# Patient Record
Sex: Male | Born: 1955 | Race: White | Hispanic: No | Marital: Married | Smoking: Former smoker
Health system: Southern US, Community
[De-identification: ages and names within clinical notes are randomized; demographics above are authoritative.]

## PROBLEM LIST (undated history)

## (undated) DIAGNOSIS — G529 Cranial nerve disorder, unspecified: Secondary | ICD-10-CM

## (undated) DIAGNOSIS — I714 Abdominal aortic aneurysm, without rupture, unspecified: Secondary | ICD-10-CM

## (undated) DIAGNOSIS — G250 Essential tremor: Secondary | ICD-10-CM

## (undated) DIAGNOSIS — F32A Depression, unspecified: Secondary | ICD-10-CM

## (undated) DIAGNOSIS — M5430 Sciatica, unspecified side: Secondary | ICD-10-CM

## (undated) DIAGNOSIS — K219 Gastro-esophageal reflux disease without esophagitis: Secondary | ICD-10-CM

## (undated) DIAGNOSIS — R296 Repeated falls: Secondary | ICD-10-CM

## (undated) DIAGNOSIS — I639 Cerebral infarction, unspecified: Secondary | ICD-10-CM

## (undated) DIAGNOSIS — R0600 Dyspnea, unspecified: Secondary | ICD-10-CM

## (undated) DIAGNOSIS — G629 Polyneuropathy, unspecified: Secondary | ICD-10-CM

## (undated) DIAGNOSIS — G2581 Restless legs syndrome: Secondary | ICD-10-CM

## (undated) DIAGNOSIS — F329 Major depressive disorder, single episode, unspecified: Secondary | ICD-10-CM

## (undated) DIAGNOSIS — I2510 Atherosclerotic heart disease of native coronary artery without angina pectoris: Secondary | ICD-10-CM

## (undated) DIAGNOSIS — K589 Irritable bowel syndrome without diarrhea: Secondary | ICD-10-CM

## (undated) DIAGNOSIS — I219 Acute myocardial infarction, unspecified: Secondary | ICD-10-CM

## (undated) DIAGNOSIS — M533 Sacrococcygeal disorders, not elsewhere classified: Secondary | ICD-10-CM

## (undated) DIAGNOSIS — J449 Chronic obstructive pulmonary disease, unspecified: Secondary | ICD-10-CM

## (undated) DIAGNOSIS — G894 Chronic pain syndrome: Secondary | ICD-10-CM

## (undated) DIAGNOSIS — F1020 Alcohol dependence, uncomplicated: Secondary | ICD-10-CM

## (undated) DIAGNOSIS — J45909 Unspecified asthma, uncomplicated: Secondary | ICD-10-CM

## (undated) DIAGNOSIS — I10 Essential (primary) hypertension: Secondary | ICD-10-CM

## (undated) DIAGNOSIS — J189 Pneumonia, unspecified organism: Secondary | ICD-10-CM

## (undated) DIAGNOSIS — M5104 Intervertebral disc disorders with myelopathy, thoracic region: Secondary | ICD-10-CM

## (undated) DIAGNOSIS — R079 Chest pain, unspecified: Secondary | ICD-10-CM

## (undated) DIAGNOSIS — F419 Anxiety disorder, unspecified: Secondary | ICD-10-CM

## (undated) DIAGNOSIS — M48061 Spinal stenosis, lumbar region without neurogenic claudication: Secondary | ICD-10-CM

## (undated) DIAGNOSIS — E785 Hyperlipidemia, unspecified: Secondary | ICD-10-CM

## (undated) DIAGNOSIS — M1990 Unspecified osteoarthritis, unspecified site: Secondary | ICD-10-CM

## (undated) DIAGNOSIS — N419 Inflammatory disease of prostate, unspecified: Secondary | ICD-10-CM

## (undated) HISTORY — PX: JOINT REPLACEMENT: SHX530

## (undated) HISTORY — DX: Major depressive disorder, single episode, unspecified: F32.9

## (undated) HISTORY — PX: KNEE SURGERY: SHX244

## (undated) HISTORY — DX: Chronic obstructive pulmonary disease, unspecified: J44.9

## (undated) HISTORY — DX: Chest pain, unspecified: R07.9

## (undated) HISTORY — DX: Sacrococcygeal disorders, not elsewhere classified: M53.3

## (undated) HISTORY — DX: Depression, unspecified: F32.A

## (undated) HISTORY — DX: Intervertebral disc disorders with myelopathy, thoracic region: M51.04

## (undated) HISTORY — DX: Essential (primary) hypertension: I10

---

## 2005-07-17 ENCOUNTER — Emergency Department: Payer: Self-pay | Admitting: Emergency Medicine

## 2005-09-17 ENCOUNTER — Encounter: Payer: Self-pay | Admitting: Family Medicine

## 2005-09-26 ENCOUNTER — Encounter: Payer: Self-pay | Admitting: Family Medicine

## 2005-10-27 ENCOUNTER — Encounter: Payer: Self-pay | Admitting: Family Medicine

## 2008-02-27 DIAGNOSIS — I219 Acute myocardial infarction, unspecified: Secondary | ICD-10-CM

## 2008-02-27 HISTORY — DX: Acute myocardial infarction, unspecified: I21.9

## 2008-06-28 DIAGNOSIS — F3341 Major depressive disorder, recurrent, in partial remission: Secondary | ICD-10-CM | POA: Insufficient documentation

## 2008-06-28 DIAGNOSIS — M5430 Sciatica, unspecified side: Secondary | ICD-10-CM | POA: Insufficient documentation

## 2008-06-28 DIAGNOSIS — I2510 Atherosclerotic heart disease of native coronary artery without angina pectoris: Secondary | ICD-10-CM | POA: Insufficient documentation

## 2009-01-13 DIAGNOSIS — M48061 Spinal stenosis, lumbar region without neurogenic claudication: Secondary | ICD-10-CM | POA: Insufficient documentation

## 2010-02-26 HISTORY — PX: CARDIAC CATHETERIZATION: SHX172

## 2010-04-17 ENCOUNTER — Ambulatory Visit: Payer: Self-pay | Admitting: Internal Medicine

## 2011-12-20 ENCOUNTER — Ambulatory Visit: Payer: Self-pay | Admitting: Family Medicine

## 2012-01-22 ENCOUNTER — Encounter: Payer: Self-pay | Admitting: Physical Medicine & Rehabilitation

## 2012-02-08 ENCOUNTER — Encounter: Payer: Worker's Compensation | Attending: Physical Medicine & Rehabilitation

## 2012-02-08 ENCOUNTER — Ambulatory Visit (HOSPITAL_BASED_OUTPATIENT_CLINIC_OR_DEPARTMENT_OTHER): Payer: Worker's Compensation | Admitting: Physical Medicine & Rehabilitation

## 2012-02-08 ENCOUNTER — Encounter: Payer: Self-pay | Admitting: Physical Medicine & Rehabilitation

## 2012-02-08 VITALS — BP 151/85 | HR 63 | Resp 14 | Ht 67.0 in | Wt 129.0 lb

## 2012-02-08 DIAGNOSIS — W1789XA Other fall from one level to another, initial encounter: Secondary | ICD-10-CM | POA: Insufficient documentation

## 2012-02-08 DIAGNOSIS — M533 Sacrococcygeal disorders, not elsewhere classified: Secondary | ICD-10-CM

## 2012-02-08 DIAGNOSIS — Z5181 Encounter for therapeutic drug level monitoring: Secondary | ICD-10-CM

## 2012-02-08 DIAGNOSIS — R269 Unspecified abnormalities of gait and mobility: Secondary | ICD-10-CM

## 2012-02-08 DIAGNOSIS — M549 Dorsalgia, unspecified: Secondary | ICD-10-CM

## 2012-02-08 DIAGNOSIS — R209 Unspecified disturbances of skin sensation: Secondary | ICD-10-CM | POA: Insufficient documentation

## 2012-02-08 HISTORY — DX: Sacrococcygeal disorders, not elsewhere classified: M53.3

## 2012-02-08 NOTE — Progress Notes (Signed)
Subjective:    Patient ID: Joseph Peters, male    DOB: [birth year], 56 y.o.   MRN: [id number] Chief complaint is intermittent numbness on the entire left side of body HPI Fall from 6 foot scaffold on October 18 Fell onto buttocks hit his head on foundation.  Patient does not recall loss of consciousness Pain shoots up in back of head. Has had feeling of numbness on the left side. Head CT performed 02/07/2012 was normal No speech problems Complains of memory issues Has episodic numbness on the side of the face left arm and left leg.  Lasting 5 or 10 minutes. Sometimes this is provoked by activity sometimes it occurs when watching TV. Had pain in the back of the head about 15 years ago but no numbness on the left side of the body  Some blurriness of vision at times  Buttocks pain is mild. Has not been back to work since injury Has been sedentary since his injury on October 18 Sleeping a lot  Pain Inventory Average Pain 8 Pain Right Now 8 My pain is stabbing  In the last 24 hours, has pain interfered with the following? General activity 10 Relation with others 10 Enjoyment of life 10 What TIME of day is your pain at its worst? night Sleep (in general) Poor  Pain is worse with: walking, bending, sitting and standing Pain improves with: rest and medication Relief from Meds: 2  Mobility walk without assistance ability to climb steps?  yes do you drive?  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  no   Family History  Problem Relation Age of Onset  . Cancer Mother   . COPD Father 10    black lung   History   Social History  . Marital Status: Unknown    Spouse Name: N/A    Number of  Children: N/A  . Years of Education: N/A   Social History Main Topics  . Smoking status: Current Every Day Smoker -- 1.0 packs/day for 30 years    Types: Cigarettes  . Smokeless tobacco: Never Used  . Alcohol Use: None  . Drug Use: None  . Sexually Active: None   Other Topics Concern  . None   Social History Narrative  . None   Past Surgical History  Procedure Date  . Knee surgery     right  . Cardiac catheterization Minor blockage   Past Medical History  Diagnosis Date  . Depression   . COPD (chronic obstructive pulmonary disease)    BP 151/85  Pulse 63  Resp 14  Ht 5\' 7"  (1.702 m)  Wt 129 lb (58.514 kg)  BMI 20.20 kg/m2  SpO2 99%    Review of Systems  Constitutional: Positive for unexpected weight change.  HENT: Positive for neck pain.   Musculoskeletal: Positive for gait problem.       Spasm  Neurological: Positive for dizziness and numbness.       Tingling  Hematological: Bruises/bleeds easily.  Psychiatric/Behavioral: Positive for dysphoric mood. The patient is nervous/anxious.   All other systems reviewed and are negative.       Objective:   Physical Exam  Constitutional: He is oriented to person, place, and time. He appears well-developed and well-nourished.  HENT:  Head: Normocephalic and atraumatic.  Eyes: Conjunctivae normal and EOM are normal. Pupils are equal, round, and reactive to light.       Visual fields are intact to confrontation testing  Musculoskeletal:       Cervical back: He exhibits normal range of motion, no tenderness, no bony tenderness, no swelling, no edema, no deformity, no pain and no spasm.       Lumbar back: He exhibits tenderness, bony tenderness and pain. He exhibits normal range of motion, no swelling, no edema, no deformity and no spasm.       Back:       Tenderness over right PSIS Tenderness over coccyx Negative straight leg raising test   Neurological: He is alert and oriented to person, place, and time. He has  normal strength and normal reflexes. He displays no atrophy. No cranial nerve deficit or sensory deficit. He exhibits normal muscle tone. Coordination and gait abnormal. He displays no Babinski's sign on the right side. He displays no Babinski's sign on the left side.  Reflex Scores:      Tricep reflexes are 2+ on the right side and 2+ on the left side.      Bicep reflexes are 2+ on the right side and 2+ on the left side.      Brachioradialis reflexes are 2+ on the right side and 2+ on the left side.      Patellar reflexes are 2+ on the right side and 2+ on the left side.      Achilles reflexes are 2+ on the right side and 2+ on the left side.      Positive Romberg Unable to do tandem gait  Psychiatric: His mood appears anxious. His affect is angry. He is agitated. He is not slowed.       Became angry when I suggested his Intermittent episodes of numbness is likely not trauma related      Assessment & Plan:  1 work related injury fall from scaffolding approximately 2 months ago.His chief complaint is intermittent numbness on the entire left side of the body. I reviewed the CT of the head which showed no evidence of subdural hematoma or hemorrhagic contusion. No CVA noted. Given the history of smoking Hyperlipidemia and his age, I would be suspicious of transient ischemic attacks. I would recommend pursuing workup for this outside of the workers comp system.  If this workup is negative and the patient continues to have some complaints of balance deficits as well as Headache pain, I would recommend he sees my partner Dr. Riley Kill to be evaluated for post concussive syndrome.  2. Coccydynia as well as possible sacroiliac pain status post fall. He may benefit from sacroiliac injection with Steroid only given his lidocaine allergy.This can be scheduled with me  I met with the patient his wife and his caseworker Antony Salmon to discuss this case and my findings and recommendations  I recommend no  work at this time until further workup is pursued

## 2012-02-08 NOTE — Patient Instructions (Signed)
I'm concerned about the episodic numbness on the left side. The CAT scan showed no evidence of brain trauma from your fall. Because you are a smoker you are at risk for strokes or mini strokes. This needs to be checked out by your doctor or by a neurologist.I would recommend no work until this is done.  If there is no evidence of strokes or mini strokes.  We can set to up for sacroiliac injection.I can do that injection.  If you continue to complain of memory issues or headaches or balance problems he would need to see my partner Dr. Riley Kill who specializes in post concussive disorders.  He can also write therapy orders and set work restrictions

## 2012-05-14 DIAGNOSIS — I6529 Occlusion and stenosis of unspecified carotid artery: Secondary | ICD-10-CM | POA: Insufficient documentation

## 2012-06-23 ENCOUNTER — Emergency Department: Payer: Self-pay | Admitting: Emergency Medicine

## 2012-06-23 LAB — CBC
HGB: 15.6 g/dL (ref 13.0–18.0)
MCH: 32.8 pg (ref 26.0–34.0)
Platelet: 174 10*3/uL (ref 150–440)
RBC: 4.76 10*6/uL (ref 4.40–5.90)
RDW: 13.1 % (ref 11.5–14.5)
WBC: 9.1 10*3/uL (ref 3.8–10.6)

## 2012-06-23 LAB — BASIC METABOLIC PANEL
Anion Gap: 8 (ref 7–16)
Chloride: 105 mmol/L (ref 98–107)
Co2: 26 mmol/L (ref 21–32)
EGFR (African American): >60
Glucose: 88 mg/dL (ref 65–99)
Sodium: 139 mmol/L (ref 136–145)

## 2013-02-03 DIAGNOSIS — M5104 Intervertebral disc disorders with myelopathy, thoracic region: Secondary | ICD-10-CM | POA: Insufficient documentation

## 2013-02-03 HISTORY — DX: Intervertebral disc disorders with myelopathy, thoracic region: M51.04

## 2013-02-26 DIAGNOSIS — I714 Abdominal aortic aneurysm, without rupture, unspecified: Secondary | ICD-10-CM

## 2013-02-26 DIAGNOSIS — G529 Cranial nerve disorder, unspecified: Secondary | ICD-10-CM

## 2013-02-26 DIAGNOSIS — I639 Cerebral infarction, unspecified: Secondary | ICD-10-CM | POA: Diagnosis present

## 2013-02-26 HISTORY — DX: Cranial nerve disorder, unspecified: G52.9

## 2013-02-26 HISTORY — DX: Abdominal aortic aneurysm, without rupture: I71.4

## 2013-02-26 HISTORY — DX: Abdominal aortic aneurysm, without rupture, unspecified: I71.40

## 2013-02-26 HISTORY — DX: Cerebral infarction, unspecified: I63.9

## 2013-11-30 DIAGNOSIS — N419 Inflammatory disease of prostate, unspecified: Secondary | ICD-10-CM | POA: Insufficient documentation

## 2014-01-20 ENCOUNTER — Emergency Department: Payer: Self-pay | Admitting: Emergency Medicine

## 2014-07-15 ENCOUNTER — Encounter: Payer: Self-pay | Admitting: Emergency Medicine

## 2014-07-15 ENCOUNTER — Emergency Department
Admission: EM | Admit: 2014-07-15 | Discharge: 2014-07-15 | Disposition: A | Discharge status: Home / Self Care | Payer: Medicaid Other | Attending: Emergency Medicine | Admitting: Emergency Medicine

## 2014-07-15 ENCOUNTER — Other Ambulatory Visit: Payer: Self-pay

## 2014-07-15 ENCOUNTER — Emergency Department: Payer: Medicaid Other

## 2014-07-15 DIAGNOSIS — Z880 Allergy status to penicillin: Secondary | ICD-10-CM | POA: Insufficient documentation

## 2014-07-15 DIAGNOSIS — Z720 Tobacco use: Secondary | ICD-10-CM | POA: Diagnosis not present

## 2014-07-15 DIAGNOSIS — R079 Chest pain, unspecified: Secondary | ICD-10-CM

## 2014-07-15 DIAGNOSIS — Z79899 Other long term (current) drug therapy: Secondary | ICD-10-CM | POA: Insufficient documentation

## 2014-07-15 DIAGNOSIS — I10 Essential (primary) hypertension: Secondary | ICD-10-CM | POA: Insufficient documentation

## 2014-07-15 LAB — BASIC METABOLIC PANEL
Anion gap: 9 (ref 5–15)
BUN: 11 mg/dL (ref 6–20)
CHLORIDE: 102 mmol/L (ref 101–111)
CO2: 27 mmol/L (ref 22–32)
CREATININE: 0.98 mg/dL (ref 0.61–1.24)
Calcium: 9.2 mg/dL (ref 8.9–10.3)
Glucose, Bld: 81 mg/dL (ref 65–99)
Potassium: 4.1 mmol/L (ref 3.5–5.1)
Sodium: 138 mmol/L (ref 135–145)

## 2014-07-15 LAB — CBC
HCT: 44.7 % (ref 40.0–52.0)
Hemoglobin: 15.3 g/dL (ref 13.0–18.0)
MCH: 33.3 pg (ref 26.0–34.0)
MCHC: 34.3 g/dL (ref 32.0–36.0)
MCV: 97.1 fL (ref 80.0–100.0)
PLATELETS: 171 10*3/uL (ref 150–440)
RBC: 4.61 MIL/uL (ref 4.40–5.90)
RDW: 13.2 % (ref 11.5–14.5)
WBC: 8.5 10*3/uL (ref 3.8–10.6)

## 2014-07-15 LAB — TROPONIN I

## 2014-07-15 MED ORDER — LORAZEPAM 0.5 MG PO TABS
ORAL_TABLET | ORAL | Status: AC
Start: 2014-07-15 — End: 2014-07-15
  Administered 2014-07-15: 0.5 mg via ORAL
  Filled 2014-07-15: qty 1

## 2014-07-15 MED ORDER — LORAZEPAM 0.5 MG PO TABS
0.5000 mg | ORAL_TABLET | Freq: Once | ORAL | Status: AC
  Administered 2014-07-15: 0.5 mg via ORAL

## 2014-07-15 NOTE — Discharge Instructions (Signed)

## 2014-07-15 NOTE — ED Notes (Signed)
Pt was at a Urology appt and they took his B/P it was elevated so they sent pt here. Pt states that he started getting chest pain yesterday. No radiation, but numbness in his left arm. He reports that he feels SOB and nauseated.

## 2014-07-15 NOTE — ED Provider Notes (Addendum)
[facility] Emergency Department Provider Note  ____________________________________________  Time seen: Approximately 610 PM  I have reviewed the triage vital signs and the nursing notes.   HISTORY  Chief Complaint Hypertension    HPI Joseph Peters is a 59 y.o. male with a history of COPD, depression and chronic pain who was sent over from his urologist's office today for high blood pressure. The patient said he is also been having chest pain for the past 2 days. Yesterday he had chest pain for several hours which she described as feeling like something was stuck under his left chest. He said the pain was moderate in intensity. Was associated with nausea and shortness of breath. He said he also had another shorter episode this morning but is pain-free at this time. Patient says that his pain and shortness of breath is not worsened by exertion. Says that he has a dog at home that is sick and dying and this is stressing him out and he thinks that is what is causing his symptoms. Patient did have a cardiac catheterization multiple years ago with stenosis but without stents. Patient says he normally has a low blood pressure although does not remember where his systolic pressure normally has. He says his diastolic pressure is normally in the 50s.   Past Medical History  Diagnosis Date  . Depression   . COPD (chronic obstructive pulmonary disease)     Patient Active Problem List   Diagnosis Date Noted  . Coccydynia 02/08/2012  . 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 Gait disorder 02/08/2012    Past Surgical History  Procedure Laterality Date  . Knee surgery      right  . Cardiac catheterization      Current Outpatient Rx  Name  Route  Sig  Dispense  Refill  . albuterol (PROVENTIL HFA;VENTOLIN HFA) 108 (90 BASE) MCG/ACT inhaler   Inhalation   Inhale 2 puffs into the lungs every 6 (six) hours as needed.         Marland Kitchen aspirin 81 MG tablet    Oral   Take 81 mg by mouth daily.         Marland Kitchen gabapentin (NEURONTIN) 300 MG capsule   Oral   Take 600 mg by mouth at bedtime.         Marland Kitchen HYDROcodone-acetaminophen (VICODIN ES) 7.5-750 MG per tablet   Oral   Take 1 tablet by mouth. May take up to 6 per day as needed         . sertraline (ZOLOFT) 100 MG tablet   Oral   Take 100 mg by mouth daily. Dr Levander Campion         . simvastatin (ZOCOR) 40 MG tablet   Oral   Take 40 mg by mouth every evening.           Allergies Lidocaine and Penicillins  Family History  Problem Relation Age of Onset  . Cancer Mother   . COPD Father 41    black lung    Social History History  Substance Use Topics  . Smoking status: Current Every Day Smoker -- 1.00 packs/day for 30 years    Types: Cigarettes  . Smokeless tobacco: Never Used  . Alcohol Use: No    Review of Systems Constitutional: No fever/chills Eyes: No visual changes. ENT: No sore throat. Cardiovascular: As above Respiratory: As above Gastrointestinal: No abdominal pain.  No diarrhea.  No constipation. Genitourinary: Negative for dysuria. Musculoskeletal: Negative for back  pain. Skin: Negative for rash. Neurological: Negative for headaches, focal weakness or numbness.  10-point ROS otherwise negative.  ____________________________________________   PHYSICAL EXAM:  VITAL SIGNS: ED Triage Vitals  Enc Vitals Group     BP 07/15/14 1627 172/83 mmHg     Pulse Rate 07/15/14 1627 58     Resp 07/15/14 1627 20     Temp 07/15/14 1627 97.4 F (36.3 C)     Temp Source 07/15/14 1627 Oral     SpO2 07/15/14 1627 100 %     Weight 07/15/14 1621 129 lb (58.514 kg)     Height 07/15/14 1621 '5\' 7"'$  (1.702 m)     Head Cir --      Peak Flow --      Pain Score 07/15/14 1622 8     Pain Loc --      Pain Edu? --      Excl. in [place]? --     Constitutional: Alert and oriented. Well appearing and in no acute distress. Eyes: Conjunctivae are normal. PERRL. EOMI. Head:  Atraumatic. Nose: No congestion/rhinnorhea. Mouth/Throat: Mucous membranes are moist.  Oropharynx non-erythematous. Neck: No stridor.   Cardiovascular: Normal rate, regular rhythm. Grossly normal heart sounds.  Good peripheral circulation. Respiratory: Normal respiratory effort.  No retractions. Lungs CTAB. Gastrointestinal: Soft and nontender. No distention. No abdominal bruits. No CVA tenderness. Musculoskeletal: No lower extremity tenderness nor edema.  No joint effusions. Neurologic:  Normal speech and language. No gross focal neurologic deficits are appreciated. Speech is normal. No gait instability. Skin:  Skin is warm, dry and intact. No rash noted. Psychiatric: Mood and affect are normal. Speech and behavior are normal.  ____________________________________________   LABS (all labs ordered are listed, but only abnormal results are displayed)  Labs Reviewed  CBC  BASIC METABOLIC PANEL  TROPONIN I   ____________________________________________  EKG  ED ECG REPORT I, Gladys Gutman,  Youlanda Roys, the attending physician, personally viewed and interpreted this ECG.   Date: 07/15/2014  EKG Time: 1627  Rate: 58  Rhythm: normal EKG, normal sinus rhythm, unchanged from previous tracings, sinus bradycardia  Axis: Normal axis  Intervals:Right ventricular conduction delay  ST&T Change: No ST elevations or depressions. No T-wave inversions.  ____________________________________________  RADIOLOGY  COPD on x-ray. No acute findings. ____________________________________________   PROCEDURES    ____________________________________________   INITIAL IMPRESSION / ASSESSMENT AND PLAN / ED COURSE  Pertinent labs & imaging results that were available during my care of the patient were reviewed by me and considered in my medical decision making (see chart for details).  ----------------------------------------- 7:38 PM on  07/15/2014 -----------------------------------------  Patient continues to rest comfortably without any chest pain or shortness of breath. Says that his anxiety is relieved after Ativan. We'll discharge to home. Discussed case with Dr. Satira Mccallum of cardiology who says he will be able to see the patient at 2:30 PM tomorrow. While the symptoms may be consistent with the patient's stress he has had an abnormal catheterization in the past and should have urgent follow-up. The patient was counseled as appointment tomorrow at 2:30 PM and agrees to go. I do not believe the patient needs admission at this time given his 1 day history of chest pain with episodes lasting hours with a normal troponin as well as a reassuring EKG. ____________________________________________   FINAL CLINICAL IMPRESSION(S) / ED DIAGNOSES  Acute chest pain. Acute hypertension. Initial visit.    Orbie Pyo, MD 07/15/14 1940  Will not prescribe antihypertensive medications because  the patient says he usually has low blood pressure. Will follow-up tomorrow with cardiology for recheck of his blood pressure and also further evaluation of his chest pain.  Orbie Pyo, MD 07/15/14 [phone]

## 2014-07-15 NOTE — ED Notes (Signed)
Pt discharged home after verbalizing understanding of discharge instructions; nad noted. 

## 2014-07-16 DIAGNOSIS — R079 Chest pain, unspecified: Secondary | ICD-10-CM

## 2014-07-16 DIAGNOSIS — I10 Essential (primary) hypertension: Secondary | ICD-10-CM

## 2014-07-16 HISTORY — DX: Essential (primary) hypertension: I10

## 2014-07-16 HISTORY — DX: Chest pain, unspecified: R07.9

## 2014-11-02 ENCOUNTER — Encounter: Payer: Self-pay | Admitting: Urology

## 2014-11-02 ENCOUNTER — Ambulatory Visit (INDEPENDENT_AMBULATORY_CARE_PROVIDER_SITE_OTHER): Payer: Medicare Other | Admitting: Urology

## 2014-11-02 VITALS — BP 169/97 | HR 60 | Ht 66.0 in | Wt 128.0 lb

## 2014-11-02 DIAGNOSIS — Z125 Encounter for screening for malignant neoplasm of prostate: Secondary | ICD-10-CM

## 2014-11-02 DIAGNOSIS — R339 Retention of urine, unspecified: Secondary | ICD-10-CM

## 2014-11-02 DIAGNOSIS — R310 Gross hematuria: Secondary | ICD-10-CM | POA: Diagnosis not present

## 2014-11-02 LAB — BLADDER SCAN AMB NON-IMAGING: SCAN RESULT: 18

## 2014-11-02 NOTE — Progress Notes (Signed)
Bladder Scan Patient can void: 18 ml Performed By: Bonnye Fava, CMA

## 2014-11-02 NOTE — Progress Notes (Signed)
11/02/2014 4:33 PM   [place] May 21, 1955 [id number]  Referring provider: Letta Median, MD [place] [address], [place] [address]  Chief Complaint  Patient presents with  . Urinary Retention    HPI:  1 - Gross Hematuria - visible terminal hematuria x several mos. 40PY smoker, still smokes.   2 - Prostate Screening - no FHX prostate cancer. 10/2014: PSA (today / pending) / DRE 40gm smooth  3 - Lower Urinary Tract Symptoms / Dysuria - pt with sensation of "peeing glass" when hematuria present. PVR 10/2014 "80m", DRE 40gm. UA w/o bacteruria at time.   PMH sig for COPD, CAD (negative cath per history, follows with Dr. HGorden Harmswith cardiology).  Today "Joseph Peters is seen as urgent work in for above. He was seen at primary care office where they were conernec about possible urinary retention.    PMH: Past Medical History  Diagnosis Date  . Depression   . COPD (chronic obstructive pulmonary disease)   . Benign hypertension 07/16/2014  . 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 Coccydynia 02/08/2012  . Chest pain on exertion 07/16/2014    Surgical History: Past Surgical History  Procedure Laterality Date  . Knee surgery      right  . Cardiac catheterization      Home Medications:    Medication List       This list is accurate as of: 11/02/14  4:33 PM.  Always use your most recent med list.               albuterol 108 (90 BASE) MCG/ACT inhaler  Commonly known as:  PROVENTIL HFA;VENTOLIN HFA  Inhale 2 puffs into the lungs every 6 (six) hours as needed.     aspirin 81 MG tablet  Take 81 mg by mouth daily.     FLUoxetine 20 MG capsule  Commonly known as:  PROZAC     gabapentin 300 MG capsule  Commonly known as:  NEURONTIN  Take 600 mg by mouth at bedtime.     HYDROcodone-acetaminophen 7.5-325 MG per tablet  Commonly known as:  NORCO  TK 1-2 TS PO UP TO TID FOR PAIN CONTROL.     losartan 25 MG tablet  Commonly known  as:  COZAAR     nitroGLYCERIN 0.4 MG SL tablet  Commonly known as:  NITROSTAT  Place under the tongue.     PARoxetine 20 MG tablet  Commonly known as:  PAXIL  Take by mouth.     sertraline 100 MG tablet  Commonly known as:  ZOLOFT  Take 100 mg by mouth daily. Dr ALevander Campion    simvastatin 40 MG tablet  Commonly known as:  ZOCOR  Take 40 mg by mouth every evening.        Allergies:  Allergies  Allergen Reactions  . Lidocaine Anaphylaxis  . Penicillin G Rash  . Penicillins Rash    Family History: Family History  Problem Relation Age of Onset  . Cancer Mother   . COPD Father [id number]   black lung    Social History:  reports that he has been smoking Cigarettes.  He has a 30 pack-year smoking history. He has never used smokeless tobacco. He reports that he drinks alcohol. He reports that he does not use illicit drugs.  ROS: UROLOGY Frequent Urination?: No Hard to postpone urination?: No Burning/pain with urination?: Yes Get up at night to urinate?: No Leakage of urine?: No Urine stream starts and  stops?: Yes Trouble starting stream?: Yes Do you have to strain to urinate?: Yes Blood in urine?: Yes Urinary tract infection?: No Sexually transmitted disease?: No Injury to kidneys or bladder?: No Painful intercourse?: No Weak stream?: Yes Erection problems?: No Penile pain?: No  Gastrointestinal Nausea?: No Vomiting?: No Indigestion/heartburn?: No Diarrhea?: No Constipation?: No  Constitutional Fever: No Night sweats?: No Weight loss?: No Fatigue?: No  Skin Skin rash/lesions?: No Itching?: No  Eyes Blurred vision?: No Double vision?: No  Ears/Nose/Throat Sore throat?: No Sinus problems?: No  Hematologic/Lymphatic Swollen glands?: No Easy bruising?: No  Cardiovascular Leg swelling?: No Chest pain?: No  Respiratory Cough?: No Shortness of breath?: No  Endocrine Excessive thirst?: No  Musculoskeletal Back pain?: No Joint pain?:  No  Neurological Headaches?: No Dizziness?: No  Psychologic Depression?: Yes Anxiety?: Yes  Physical Exam: BP 169/97 mmHg  Pulse 60  Ht '5\' 6"'$  (1.676 m)  Wt 128 lb (58.06 kg)  BMI 20.67 kg/m2  Constitutional:  Alert and oriented, No acute distress. HEENT: [place] AT, moist mucus membranes.  Trachea midline, no masses. Cardiovascular: No clubbing, cyanosis, or edema. Respiratory: Normal respiratory effort, no increased work of breathing. GI: Abdomen is soft, nontender, nondistended, no abdominal masses GU: No CVA tenderness. DRE 40gm smooth. Testes descended and w/o palpable mass. Phallsu straight.  Skin: No rashes, bruises or suspicious lesions. Lymph: No cervical or inguinal adenopathy. Neurologic: Grossly intact, no focal deficits, moving all 4 extremities. Psychiatric: Normal mood and affect.  Laboratory Data: Lab Results  Component Value Date   WBC 8.5 07/15/2014   HGB 15.3 07/15/2014   HCT 44.7 07/15/2014   MCV 97.1 07/15/2014   PLT 171 07/15/2014    Lab Results  Component Value Date   CREATININE 0.98 07/15/2014    No results found for: PSA  No results found for: TESTOSTERONE  No results found for: HGBA1C  Urinalysis No results found for: COLORURINE, APPEARANCEUR, LABSPEC, PHURINE, GLUCOSEU, HGBUR, BILIRUBINUR, KETONESUR, PROTEINUR, UROBILINOGEN, NITRITE, LEUKOCYTESUR  Pertinent Imaging:  none  Assessment & Plan:     1 - Gross Hematuria - most concerning complaint today, especially in setting of extensive smoking history. Expalined work up and goals. BMP, PSA today, RTC for CT and CT hematuria protocol.   2 - Prostate Screening - DRE today unremarkabel. PSA today as well for this and above.   3 - Lower Urinary Tract Symptoms / Dysuria - UA and exam not consistent with infection. Likely irritation from blood when presnt per history. He is not in retention.   4 - RTC 2 weeks for CT and cysto  Return in about 2 weeks (around 11/16/2014).  Alexis Frock, [facility] [address], [place], [place] [address] [phone]

## 2014-11-03 LAB — URINALYSIS, COMPLETE
Bilirubin, UA: NEGATIVE
Glucose, UA: NEGATIVE
Ketones, UA: NEGATIVE
Leukocytes, UA: NEGATIVE
Nitrite, UA: NEGATIVE
Protein, UA: NEGATIVE
RBC, UA: NEGATIVE
Specific Gravity, UA: 1.015 (ref 1.005–1.030)
Urobilinogen, Ur: 0.2 mg/dL (ref 0.2–1.0)
pH, UA: 7 (ref 5.0–7.5)

## 2014-11-03 LAB — BASIC METABOLIC PANEL
BUN/Creatinine Ratio: 12 (ref 9–20)
BUN: 14 mg/dL (ref 6–24)
CO2: 24 mmol/L (ref 18–29)
Calcium: 9.7 mg/dL (ref 8.7–10.2)
Chloride: 97 mmol/L (ref 97–108)
Creatinine, Ser: 1.14 mg/dL (ref 0.76–1.27)
GFR calc Af Amer: 81 mL/min/{1.73_m2} (ref >59)
GFR calc non Af Amer: 70 mL/min/{1.73_m2} (ref >59)
Glucose: 87 mg/dL (ref 65–99)
Potassium: 5.4 mmol/L — ABNORMAL HIGH (ref 3.5–5.2)
Sodium: 138 mmol/L (ref 134–144)

## 2014-11-03 LAB — PSA: Prostate Specific Ag, Serum: 0.4 ng/mL (ref 0.0–4.0)

## 2014-11-03 LAB — MICROSCOPIC EXAMINATION
BACTERIA UA: NONE SEEN
Epithelial Cells (non renal): NONE SEEN /hpf (ref 0–10)
RBC, UA: NONE SEEN /hpf (ref 0–?)

## 2014-11-19 ENCOUNTER — Ambulatory Visit
Admission: RE | Admit: 2014-11-19 | Discharge: 2014-11-19 | Disposition: A | Discharge status: Home / Self Care | Payer: Medicare Other | Source: Ambulatory Visit | Attending: Urology | Admitting: Urology

## 2014-11-19 DIAGNOSIS — J439 Emphysema, unspecified: Secondary | ICD-10-CM | POA: Insufficient documentation

## 2014-11-19 DIAGNOSIS — R310 Gross hematuria: Secondary | ICD-10-CM | POA: Diagnosis present

## 2014-11-19 DIAGNOSIS — I714 Abdominal aortic aneurysm, without rupture: Secondary | ICD-10-CM | POA: Diagnosis not present

## 2014-11-19 DIAGNOSIS — I700 Atherosclerosis of aorta: Secondary | ICD-10-CM | POA: Diagnosis not present

## 2014-11-19 MED ORDER — IOHEXOL 350 MG/ML SOLN
125.0000 mL | Freq: Once | INTRAVENOUS | Status: AC | PRN
  Administered 2014-11-19: 125 mL via INTRAVENOUS

## 2014-11-22 ENCOUNTER — Telehealth: Payer: Self-pay | Admitting: Urology

## 2014-11-22 NOTE — Telephone Encounter (Signed)
-----   Message from Alexis Frock, MD sent at 11/21/2014  5:20 AM EDT ----- Regarding: call back / letter Please inform Joseph Peters of following results from recetn appt. Related to blood in urine   A - PSA (prostate screening bloodwork) normal B - BMP (kidney function check) normal C - CT scan: no kidney stones / masses. 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  D - proceed as planned with office cysto next visit to complete eval  Thanks, T. Tresa Moore

## 2014-11-22 NOTE — Telephone Encounter (Signed)
Patient and wife notified of results, will keep apt for cysto tomorrow/SW

## 2014-11-23 ENCOUNTER — Ambulatory Visit (INDEPENDENT_AMBULATORY_CARE_PROVIDER_SITE_OTHER): Payer: Medicare Other | Admitting: Urology

## 2014-11-23 VITALS — BP 168/105 | HR 72 | Ht 67.0 in | Wt 127.6 lb

## 2014-11-23 DIAGNOSIS — R310 Gross hematuria: Secondary | ICD-10-CM | POA: Diagnosis not present

## 2014-11-23 DIAGNOSIS — I714 Abdominal aortic aneurysm, without rupture, unspecified: Secondary | ICD-10-CM

## 2014-11-23 MED ORDER — CIPROFLOXACIN HCL 500 MG PO TABS
500.0000 mg | ORAL_TABLET | Freq: Once | ORAL | Status: AC
  Administered 2014-11-23: 500 mg via ORAL

## 2014-11-23 MED ORDER — FINASTERIDE 5 MG PO TABS
5.0000 mg | ORAL_TABLET | Freq: Every day | ORAL | Status: DC
Start: 2014-11-23 — End: 2016-12-25

## 2014-11-23 NOTE — Progress Notes (Signed)
11:33 AM   Joseph Peters Dec 19, 1955 [id number]  Referring Joseph Peters: Joseph Median, MD Joseph Peters, Joseph Peters [id number]  No chief complaint on file.   HPI:  1 - Gross Hematuria - visible terminal hematuria x several mos. 40PY smoker, still smokes. CT Urogram 10/2014 w/o GU mass / stone. Cysto 10/2014 (today) with significant bilobar prostatic hypertrophy with varices.   2 - Prostate Screening - no FHX prostate cancer. 10/2014: PSA 0.4 / DRE 40gm smooth  3 - Lower Urinary Tract Symptoms / Dysuria - pt with sensation of "peeing glass" when hematuria present. PVR 10/2014 "94m", DRE 40gm. UA w/o bacteruria at time.   4 - Abdominal Aortic Aneurysm - incidental 3.2cm infrarenal AAA by hematuria CT 2016. He is smoker.   PMH sig for COPD, CAD (negative cath per history, follows with Dr. HGorden Harmswith cardiology).  Today "Joseph Peters is seen for cysto and f/u above. Interval CT with relativley small incidetnal AAA.    PMH: Past Medical History  Diagnosis Date  . Depression   . COPD (chronic obstructive pulmonary disease)   . Benign hypertension 07/16/2014  . 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 Coccydynia 02/08/2012  . Chest pain on exertion 07/16/2014    Surgical History: Past Surgical History  Procedure Laterality Date  . Knee surgery      right  . Cardiac catheterization      Home Medications:    Medication List       This list is accurate as of: 11/23/14 11:33 AM.  Always use your most recent med list.               albuterol 108 (90 BASE) MCG/ACT inhaler  Commonly known as:  PROVENTIL HFA;VENTOLIN HFA  Inhale 2 puffs into the lungs every 6 (six) hours as needed.     aspirin 81 MG tablet  Take 81 mg by mouth daily.     FLUoxetine 20 MG capsule  Commonly known as:  PROZAC     gabapentin 300 MG capsule  Commonly known as:  NEURONTIN  Take 600 mg by mouth at bedtime.     HYDROcodone-acetaminophen 7.5-325 MG tablet   Commonly known as:  NORCO  TK 1-2 TS PO UP TO TID FOR PAIN CONTROL.     losartan 25 MG tablet  Commonly known as:  COZAAR     nitroGLYCERIN 0.4 MG SL tablet  Commonly known as:  NITROSTAT  Place under the tongue.     PARoxetine 20 MG tablet  Commonly known as:  PAXIL  Take by mouth.     sertraline 100 MG tablet  Commonly known as:  ZOLOFT  Take 100 mg by mouth daily. Dr ALevander Campion    simvastatin 40 MG tablet  Commonly known as:  ZOCOR  Take 40 mg by mouth every evening.        Allergies:  Allergies  Allergen Reactions  . Lidocaine Anaphylaxis  . Penicillin G Rash  . Penicillins Rash    Family History: Family History  Problem Relation Age of Onset  . Cancer Mother   . COPD Father 846   black lung    Social History:  reports that he has been smoking Cigarettes.  He has a 30 pack-year smoking history. He has never used smokeless tobacco. He reports that he drinks alcohol. He reports that he does not use illicit drugs.  ROS:     Review of Systems  Gastrointestinal (upper)  :  Negative for upper GI symptoms  Gastrointestinal (lower) : Negative for lower GI symptoms  Constitutional : Negative for symptoms  Skin: Negative for skin symptoms  Eyes: Negative for eye symptoms  Ear/Nose/Throat : Negative for Ear/Nose/Throat symptoms  Hematologic/Lymphatic: Negative for Hematologic/Lymphatic symptoms  Cardiovascular : Negative for cardiovascular symptoms  Respiratory : Negative for respiratory symptoms  Endocrine: Negative for endocrine symptoms  Musculoskeletal: Negative for musculoskeletal symptoms  Neurological: Negative for neurological symptoms  Psychologic: Negative for psychiatric symptoms     Physical Exam: There were no vitals taken for this visit.  Constitutional:  Alert and oriented, No acute distress. HEENT: [place] AT, moist mucus membranes.  Trachea midline, no masses. Cardiovascular: No clubbing, cyanosis, or  edema. Respiratory: Normal respiratory effort, no increased work of breathing. GI: Abdomen is soft, nontender, nondistended, no abdominal masses GU: No CVA tenderness. DRE 40gm smooth. Testes descended and w/o palpable mass. Phallus straight.  Skin: No rashes, bruises or suspicious lesions. Lymph: No cervical or inguinal adenopathy. Neurologic: Grossly intact, no focal deficits, moving all 4 extremities. Psychiatric: Normal mood and affect.  Laboratory Data: Lab Results  Component Value Date   WBC 8.5 07/15/2014   HGB 15.3 07/15/2014   HCT 44.7 07/15/2014   MCV 97.1 07/15/2014   PLT 171 07/15/2014    Lab Results  Component Value Date   CREATININE 1.14 11/02/2014    Lab Results  Component Value Date   PSA 0.4 11/02/2014    No results found for: TESTOSTERONE  No results found for: HGBA1C  Urinalysis    Component Value Date/Time   GLUCOSEU Negative 11/02/2014 1602   BILIRUBINUR Negative 11/02/2014 1602   NITRITE Negative 11/02/2014 1602   LEUKOCYTESUR Negative 11/02/2014 1602       Cystoscopy Procedure Note  Patient identification was confirmed, informed consent was obtained, and patient was prepped using Betadine solution.  Lidocaine jelly was administered per urethral meatus.    Preoperative abx where received prior to procedure.     Pre-Procedure: - Inspection reveals a normal caliber ureteral meatus.  Procedure: The flexible cystoscope was introduced without difficulty - No urethral strictures/lesions are present. - Enlarged prostate with several varices. - Normal bladder neck - Bilateral ureteral orifices identified - Bladder mucosa  reveals no ulcers, tumors, or lesions - No bladder stones - No trabeculation  Retroflexion shows no additional worrisome findings.    Post-Procedure: - Patient tolerated the procedure well    Assessment & Plan:     1 - Gross Hematuria - eval with labs, imaging, cysto w/o worrisome etiologies. Favor source  prostatic vairces (benign) Reinforced need for repeat eval for future persistant recurrent visible blood.    2 - Prostate Screening - up to date this year, continue annual screening.   3 - Lower Urinary Tract Symptoms / Dysuria - Likely from intermitnat prostatic varix irritation, discussed trial of finasteride for this and likely prostate-source hematuria, and he wants to try. Time course for improvement discussed.   4 - Abdominal Aortic Aneurysm - rec referral to vascular surgery for opinion and likely surveillance.   5 - RTC 1 year with PSA prior or sooner if any acute issues arise.   No Follow-up on file.  Alexis Frock, [facility] [address], [place], [place] [address] [phone]

## 2015-01-30 DIAGNOSIS — Z889 Allergy status to unspecified drugs, medicaments and biological substances status: Secondary | ICD-10-CM | POA: Insufficient documentation

## 2015-01-31 ENCOUNTER — Inpatient Hospital Stay: Payer: Medicare Other

## 2015-01-31 ENCOUNTER — Inpatient Hospital Stay
Admission: EM | Admit: 2015-01-31 | Discharge: 2015-02-02 | DRG: 190 | Disposition: A | Discharge status: Home / Self Care | Payer: Medicare Other | Attending: Internal Medicine | Admitting: Internal Medicine

## 2015-01-31 ENCOUNTER — Encounter: Payer: Self-pay | Admitting: Emergency Medicine

## 2015-01-31 ENCOUNTER — Emergency Department: Payer: Medicare Other

## 2015-01-31 DIAGNOSIS — F17210 Nicotine dependence, cigarettes, uncomplicated: Secondary | ICD-10-CM | POA: Diagnosis present

## 2015-01-31 DIAGNOSIS — Z7982 Long term (current) use of aspirin: Secondary | ICD-10-CM

## 2015-01-31 DIAGNOSIS — J9600 Acute respiratory failure, unspecified whether with hypoxia or hypercapnia: Secondary | ICD-10-CM

## 2015-01-31 DIAGNOSIS — E785 Hyperlipidemia, unspecified: Secondary | ICD-10-CM | POA: Diagnosis present

## 2015-01-31 DIAGNOSIS — Z79899 Other long term (current) drug therapy: Secondary | ICD-10-CM

## 2015-01-31 DIAGNOSIS — N400 Enlarged prostate without lower urinary tract symptoms: Secondary | ICD-10-CM | POA: Diagnosis present

## 2015-01-31 DIAGNOSIS — F1021 Alcohol dependence, in remission: Secondary | ICD-10-CM | POA: Diagnosis present

## 2015-01-31 DIAGNOSIS — Z880 Allergy status to penicillin: Secondary | ICD-10-CM | POA: Diagnosis not present

## 2015-01-31 DIAGNOSIS — F17200 Nicotine dependence, unspecified, uncomplicated: Secondary | ICD-10-CM | POA: Diagnosis present

## 2015-01-31 DIAGNOSIS — J9601 Acute respiratory failure with hypoxia: Secondary | ICD-10-CM | POA: Diagnosis present

## 2015-01-31 DIAGNOSIS — J441 Chronic obstructive pulmonary disease with (acute) exacerbation: Secondary | ICD-10-CM

## 2015-01-31 DIAGNOSIS — I10 Essential (primary) hypertension: Secondary | ICD-10-CM | POA: Diagnosis present

## 2015-01-31 DIAGNOSIS — F329 Major depressive disorder, single episode, unspecified: Secondary | ICD-10-CM | POA: Diagnosis present

## 2015-01-31 DIAGNOSIS — R310 Gross hematuria: Secondary | ICD-10-CM | POA: Diagnosis present

## 2015-01-31 DIAGNOSIS — Z809 Family history of malignant neoplasm, unspecified: Secondary | ICD-10-CM | POA: Diagnosis not present

## 2015-01-31 DIAGNOSIS — R52 Pain, unspecified: Secondary | ICD-10-CM

## 2015-01-31 DIAGNOSIS — Z825 Family history of asthma and other chronic lower respiratory diseases: Secondary | ICD-10-CM

## 2015-01-31 LAB — CBC
HCT: 45.7 % (ref 40.0–52.0)
Hemoglobin: 15.7 g/dL (ref 13.0–18.0)
MCH: 33.4 pg (ref 26.0–34.0)
MCHC: 34.4 g/dL (ref 32.0–36.0)
MCV: 97.2 fL (ref 80.0–100.0)
Platelets: 167 10*3/uL (ref 150–440)
RBC: 4.7 MIL/uL (ref 4.40–5.90)
RDW: 13.3 % (ref 11.5–14.5)
WBC: 19.9 10*3/uL — ABNORMAL HIGH (ref 3.8–10.6)

## 2015-01-31 LAB — COMPREHENSIVE METABOLIC PANEL
ALT: 32 U/L (ref 17–63)
ANION GAP: 9 (ref 5–15)
AST: 29 U/L (ref 15–41)
Albumin: 4.1 g/dL (ref 3.5–5.0)
Alkaline Phosphatase: 90 U/L (ref 38–126)
BUN: 12 mg/dL (ref 6–20)
CO2: 26 mmol/L (ref 22–32)
Calcium: 9.2 mg/dL (ref 8.9–10.3)
Chloride: 99 mmol/L — ABNORMAL LOW (ref 101–111)
Creatinine, Ser: 0.85 mg/dL (ref 0.61–1.24)
GFR calc Af Amer: >60 mL/min (ref >60)
Glucose, Bld: 92 mg/dL (ref 65–99)
Potassium: 4 mmol/L (ref 3.5–5.1)
SODIUM: 134 mmol/L — AB (ref 135–145)
TOTAL PROTEIN: 7.3 g/dL (ref 6.5–8.1)
Total Bilirubin: 0.7 mg/dL (ref 0.3–1.2)

## 2015-01-31 LAB — BLOOD GAS, ARTERIAL
ACID-BASE EXCESS: 0.7 mmol/L (ref 0.0–3.0)
Allens test (pass/fail): POSITIVE — AB
Bicarbonate: 22 mEq/L (ref 21.0–28.0)
FIO2: 0.21
O2 SAT: 96.6 %
PATIENT TEMPERATURE: 37
PCO2 ART: 27 mmHg — AB (ref 32.0–48.0)
pH, Arterial: 7.52 — ABNORMAL HIGH (ref 7.350–7.450)
pO2, Arterial: 77 mmHg — ABNORMAL LOW (ref 83.0–108.0)

## 2015-01-31 LAB — TROPONIN I: Troponin I: <0.03 ng/mL (ref <0.031)

## 2015-01-31 MED ORDER — SODIUM CHLORIDE 0.9 % IJ SOLN
3.0000 mL | INTRAMUSCULAR | Status: DC | PRN
  Administered 2015-01-31 – 2015-02-01 (×2): 3 mL via INTRAVENOUS
  Filled 2015-01-31 (×2): qty 10

## 2015-01-31 MED ORDER — GABAPENTIN 300 MG PO CAPS
900.0000 mg | ORAL_CAPSULE | Freq: Every day | ORAL | Status: DC
  Administered 2015-01-31 – 2015-02-01 (×2): 900 mg via ORAL
  Filled 2015-01-31 (×2): qty 3

## 2015-01-31 MED ORDER — BUDESONIDE 0.25 MG/2ML IN SUSP
0.2500 mg | Freq: Two times a day (BID) | RESPIRATORY_TRACT | Status: DC
  Administered 2015-01-31 – 2015-02-02 (×4): 0.25 mg via RESPIRATORY_TRACT
  Filled 2015-01-31 (×5): qty 2

## 2015-01-31 MED ORDER — ASPIRIN EC 81 MG PO TBEC
81.0000 mg | DELAYED_RELEASE_TABLET | Freq: Every day | ORAL | Status: DC
  Administered 2015-01-31 – 2015-02-01 (×2): 81 mg via ORAL
  Filled 2015-01-31 (×2): qty 1

## 2015-01-31 MED ORDER — ALBUTEROL SULFATE (2.5 MG/3ML) 0.083% IN NEBU
2.5000 mg | INHALATION_SOLUTION | Freq: Once | RESPIRATORY_TRACT | Status: AC
  Administered 2015-01-31: 2.5 mg via RESPIRATORY_TRACT
  Filled 2015-01-31: qty 3

## 2015-01-31 MED ORDER — NICOTINE 21 MG/24HR TD PT24
21.0000 mg | MEDICATED_PATCH | Freq: Every day | TRANSDERMAL | Status: DC
  Administered 2015-01-31 – 2015-02-02 (×3): 21 mg via TRANSDERMAL
  Filled 2015-01-31 (×3): qty 1

## 2015-01-31 MED ORDER — HEPARIN SODIUM (PORCINE) 5000 UNIT/ML IJ SOLN
5000.0000 [IU] | Freq: Three times a day (TID) | INTRAMUSCULAR | Status: DC
  Administered 2015-01-31 – 2015-02-02 (×6): 5000 [IU] via SUBCUTANEOUS
  Filled 2015-01-31 (×6): qty 1

## 2015-01-31 MED ORDER — METHYLPREDNISOLONE SODIUM SUCC 125 MG IJ SOLR
125.0000 mg | Freq: Once | INTRAMUSCULAR | Status: AC
Start: 2015-01-31 — End: 2015-01-31
  Administered 2015-01-31: 125 mg via INTRAVENOUS

## 2015-01-31 MED ORDER — OXYCODONE-ACETAMINOPHEN 5-325 MG PO TABS
1.0000 | ORAL_TABLET | Freq: Four times a day (QID) | ORAL | Status: DC | PRN
  Administered 2015-01-31 – 2015-02-01 (×2): 1 via ORAL
  Filled 2015-01-31 (×2): qty 1

## 2015-01-31 MED ORDER — FLUOXETINE HCL 20 MG PO CAPS
20.0000 mg | ORAL_CAPSULE | Freq: Every day | ORAL | Status: DC
  Administered 2015-01-31 – 2015-02-01 (×2): 20 mg via ORAL
  Filled 2015-01-31 (×2): qty 1

## 2015-01-31 MED ORDER — FINASTERIDE 5 MG PO TABS
5.0000 mg | ORAL_TABLET | Freq: Every day | ORAL | Status: DC
  Administered 2015-02-01 – 2015-02-02 (×2): 5 mg via ORAL
  Filled 2015-01-31 (×2): qty 1

## 2015-01-31 MED ORDER — IPRATROPIUM-ALBUTEROL 0.5-2.5 (3) MG/3ML IN SOLN
RESPIRATORY_TRACT | Status: AC
  Administered 2015-01-31: 3 mL via RESPIRATORY_TRACT
  Filled 2015-01-31: qty 3

## 2015-01-31 MED ORDER — DEXTROSE 5 % IV SOLN
1.0000 g | INTRAVENOUS | Status: DC
  Administered 2015-01-31 – 2015-02-01 (×2): 1 g via INTRAVENOUS
  Filled 2015-01-31 (×3): qty 10

## 2015-01-31 MED ORDER — NORTRIPTYLINE HCL 25 MG PO CAPS
25.0000 mg | ORAL_CAPSULE | Freq: Every day | ORAL | Status: DC
  Administered 2015-01-31 – 2015-02-01 (×2): 25 mg via ORAL
  Filled 2015-01-31 (×2): qty 1

## 2015-01-31 MED ORDER — METHYLPREDNISOLONE SODIUM SUCC 125 MG IJ SOLR
60.0000 mg | Freq: Four times a day (QID) | INTRAMUSCULAR | Status: DC
  Administered 2015-01-31 – 2015-02-02 (×8): 60 mg via INTRAVENOUS
  Filled 2015-01-31 (×9): qty 2

## 2015-01-31 MED ORDER — TIOTROPIUM BROMIDE MONOHYDRATE 18 MCG IN CAPS
18.0000 ug | ORAL_CAPSULE | Freq: Every day | RESPIRATORY_TRACT | Status: DC
  Administered 2015-01-31 – 2015-02-01 (×2): 18 ug via RESPIRATORY_TRACT
  Filled 2015-01-31: qty 5

## 2015-01-31 MED ORDER — SIMVASTATIN 40 MG PO TABS
40.0000 mg | ORAL_TABLET | Freq: Every day | ORAL | Status: DC
  Administered 2015-01-31 – 2015-02-01 (×2): 40 mg via ORAL
  Filled 2015-01-31 (×2): qty 1

## 2015-01-31 MED ORDER — IPRATROPIUM-ALBUTEROL 0.5-2.5 (3) MG/3ML IN SOLN
3.0000 mL | Freq: Once | RESPIRATORY_TRACT | Status: AC
  Administered 2015-01-31: 3 mL via RESPIRATORY_TRACT

## 2015-01-31 MED ORDER — NITROGLYCERIN 0.4 MG SL SUBL: 0.4000 mg | SUBLINGUAL_TABLET | SUBLINGUAL | Status: DC | PRN

## 2015-01-31 MED ORDER — ONDANSETRON HCL 4 MG/2ML IJ SOLN
4.0000 mg | Freq: Once | INTRAMUSCULAR | Status: AC
  Administered 2015-01-31: 4 mg via INTRAVENOUS
  Filled 2015-01-31: qty 2

## 2015-01-31 MED ORDER — SODIUM CHLORIDE 0.9 % IJ SOLN
3.0000 mL | Freq: Two times a day (BID) | INTRAMUSCULAR | Status: DC
  Administered 2015-01-31 – 2015-02-02 (×4): 3 mL via INTRAVENOUS

## 2015-01-31 MED ORDER — LOSARTAN POTASSIUM 50 MG PO TABS
25.0000 mg | ORAL_TABLET | Freq: Every day | ORAL | Status: DC
  Administered 2015-01-31 – 2015-02-01 (×2): 25 mg via ORAL
  Filled 2015-01-31 (×2): qty 1

## 2015-01-31 MED ORDER — IPRATROPIUM-ALBUTEROL 0.5-2.5 (3) MG/3ML IN SOLN
3.0000 mL | RESPIRATORY_TRACT | Status: DC
  Administered 2015-01-31 – 2015-02-02 (×10): 3 mL via RESPIRATORY_TRACT
  Filled 2015-01-31 (×11): qty 3

## 2015-01-31 MED ORDER — DEXTROSE 5 % IV SOLN
250.0000 mg | INTRAVENOUS | Status: DC
  Administered 2015-01-31 – 2015-02-01 (×2): 250 mg via INTRAVENOUS
  Filled 2015-01-31 (×3): qty 250

## 2015-01-31 MED ORDER — METHYLPREDNISOLONE SODIUM SUCC 125 MG IJ SOLR
INTRAMUSCULAR | Status: AC
  Administered 2015-01-31: 125 mg via INTRAVENOUS
  Filled 2015-01-31: qty 2

## 2015-01-31 NOTE — Progress Notes (Signed)
Pt c/o pain and has no orders for pain meds per MD order percocet every 6 hours as needed. Pt also requesting nicotine patch and '21mg'$  patch was ordered.

## 2015-01-31 NOTE — ED Notes (Addendum)
Pt in via triage; passed out at desk, dyspneic upon arrival, alert to sternal rub.  Pt complaining of chest pain and pain in back of eyes.  Family states hx of COPD.  MD at bedside.

## 2015-01-31 NOTE — H&P (Signed)
[place] [facility] [place] NAME: Joseph Peters    MR#:  [id number]  DATE OF BIRTH:  Nov 11, 1955  DATE OF ADMISSION:  01/31/2015  PRIMARY CARE PHYSICIAN: Gearldine Shown, DO   REQUESTING/REFERRING PHYSICIAN: [place]  CHIEF COMPLAINT:   Chief Complaint  Patient presents with  . Shortness of Breath    HISTORY OF PRESENT ILLNESS: Joseph Peters  is a 59 y.o. male with a known history of depression, COPD, benign hypertension,- for last 2 weeks as complain of shortness of breath and some runny nose, he was also feeling short of breath so 2 weeks ago was given a dose of oral steroid and the antibiotic treatment which he took for one week but did not improve enough. He continued to feel short of breath and some tightness in the chest throughout the week and last 2 days he felt severely worse and short of breath so finally decided to come to emergency room today. He had tachypnea and hypoxia in ER and wheezing so given to hospitalist team for COPD exacerbation admission.  PAST MEDICAL HISTORY:   Past Medical History  Diagnosis Date  . Depression   . COPD (chronic obstructive pulmonary disease) ([place])   . Benign hypertension 07/16/2014  . 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 Coccydynia 02/08/2012  . Chest pain on exertion 07/16/2014    PAST SURGICAL HISTORY:  Past Surgical History  Procedure Laterality Date  . Knee surgery      right  . Cardiac catheterization      SOCIAL HISTORY:  Social History  Substance Use Topics  . Smoking status: Current Every Day Smoker -- 1.00 packs/day for 30 years    Types: Cigarettes  . Smokeless tobacco: Never Used  . Alcohol Use: 0.0 oz/week    0 Standard drinks or equivalent per week    FAMILY HISTORY:  Family History  Problem Relation Age of Onset  . Cancer Mother   . COPD Father 62    black lung    DRUG ALLERGIES:  Allergies  Allergen Reactions  . Lidocaine  Anaphylaxis  . Penicillins Rash and Other (See Comments)    Has patient had a PCN reaction causing immediate rash, facial/tongue/throat swelling, SOB or lightheadedness with hypotension: No Has patient had a PCN reaction causing severe rash involving mucus membranes or skin necrosis: No Has patient had a PCN reaction that required hospitalization No Has patient had a PCN reaction occurring within the last 10 years: No If all of the above answers are "NO", then may proceed with Cephalosporin use.     REVIEW OF SYSTEMS:   CONSTITUTIONAL: No fever, fatigue or weakness.  EYES: No blurred or double vision.  EARS, NOSE, AND THROAT: No tinnitus or ear pain.  RESPIRATORY: No cough, positive for shortness of breath, mild wheezing.  CARDIOVASCULAR: No chest pain, orthopnea, edema.  GASTROINTESTINAL: No nausea, vomiting, diarrhea or abdominal pain.  GENITOURINARY: No dysuria, hematuria.  ENDOCRINE: No polyuria, nocturia,  HEMATOLOGY: No anemia, easy bruising or bleeding SKIN: No rash or lesion. MUSCULOSKELETAL: No joint pain or arthritis.   NEUROLOGIC: No tingling, numbness, weakness.  PSYCHIATRY: No anxiety or depression.   MEDICATIONS AT HOME:  Prior to Admission medications   Medication Sig Start Date End Date Taking? Authorizing Provider  aspirin EC 81 MG tablet Take 81 mg by mouth at bedtime.    Yes Historical Provider, MD  finasteride (PROSCAR) 5 MG tablet Take 1 tablet (5 mg total) by  mouth daily. 11/23/14  Yes Alexis Frock, MD  FLUoxetine (PROZAC) 20 MG capsule Take 20 mg by mouth at bedtime.    Yes Historical Provider, MD  gabapentin (NEURONTIN) 300 MG capsule Take 900 mg by mouth at bedtime.    Yes Historical Provider, MD  losartan (COZAAR) 25 MG tablet Take 25 mg by mouth at bedtime.    Yes Historical Provider, MD  nortriptyline (PAMELOR) 25 MG capsule Take 25 mg by mouth at bedtime.   Yes Historical Provider, MD  simvastatin (ZOCOR) 40 MG tablet Take 40 mg by mouth at bedtime.     Yes Historical Provider, MD  tiotropium (SPIRIVA) 18 MCG inhalation capsule Place 18 mcg into inhaler and inhale at bedtime.    Yes Historical Provider, MD  nitroGLYCERIN (NITROSTAT) 0.4 MG SL tablet Place 0.4 mg under the tongue every 5 (five) minutes as needed for chest pain.     Historical Provider, MD      PHYSICAL EXAMINATION:   VITAL SIGNS: Blood pressure 136/95, pulse 113, temperature 98 F (36.7 C), temperature source Oral, resp. rate 25, height 6' (1.829 m), weight 58.968 kg (130 lb), SpO2 96 %.  GENERAL:  59 y.o.-year-old patient lying in the bed with no acute distress.  EYES: Pupils equal, round, reactive to light and accommodation. No scleral icterus. Extraocular muscles intact.  HEENT: Head atraumatic, normocephalic. Oropharynx and nasopharynx clear.  NECK:  Supple, no jugular venous distention. No thyroid enlargement, no tenderness.  LUNGS: Normal breath sounds bilaterally, mild wheezing, no rales,rhonchi or crepitation. Positive use of accessory muscles of respiration. On nasal cannula supplemental oxygen CARDIOVASCULAR: S1, S2 normal. No murmurs, rubs, or gallops.  ABDOMEN: Soft, nontender, nondistended. Bowel sounds present. No organomegaly or mass.  EXTREMITIES: No pedal edema, cyanosis, or clubbing.  NEUROLOGIC: Cranial nerves II through XII are intact. Muscle strength 5/5 in all extremities. Sensation intact. Gait not checked.  PSYCHIATRIC: The patient is alert and oriented x 3.  SKIN: No obvious rash, lesion, or ulcer.   LABORATORY PANEL:   CBC  Recent Labs Lab 01/31/15 1123  WBC 19.9*  HGB 15.7  HCT 45.7  PLT 167  MCV 97.2  MCH 33.4  MCHC 34.4  RDW 13.3   ------------------------------------------------------------------------------------------------------------------  Chemistries   Recent Labs Lab 01/31/15 1123  NA 134*  K 4.0  CL 99*  CO2 26  GLUCOSE 92  BUN 12  CREATININE 0.85  CALCIUM 9.2  AST 29  ALT 32  ALKPHOS 90  BILITOT 0.7    ------------------------------------------------------------------------------------------------------------------ estimated creatinine clearance is 79.1 mL/min (by C-G formula based on Cr of 0.85). ------------------------------------------------------------------------------------------------------------------ No results for input(s): TSH, T4TOTAL, T3FREE, THYROIDAB in the last 72 hours.  Invalid input(s): FREET3   Coagulation profile No results for input(s): INR, PROTIME in the last 168 hours. ------------------------------------------------------------------------------------------------------------------- No results for input(s): DDIMER in the last 72 hours. -------------------------------------------------------------------------------------------------------------------  Cardiac Enzymes  Recent Labs Lab 01/31/15 1123  TROPONINI <0.03   ------------------------------------------------------------------------------------------------------------------ Invalid input(s): POCBNP  ---------------------------------------------------------------------------------------------------------------  Urinalysis    Component Value Date/Time   GLUCOSEU Negative 11/02/2014 1602   BILIRUBINUR Negative 11/02/2014 1602   NITRITE Negative 11/02/2014 1602   LEUKOCYTESUR Negative 11/02/2014 1602     RADIOLOGY: Dg Chest Portable 1 View  01/31/2015  CLINICAL DATA:  Passed out.  Dyspnea EXAM: PORTABLE CHEST 1 VIEW COMPARISON:  07/15/2014 FINDINGS: Hyperinflation, interstitial coarsening, and apical lucency consistent with COPD/emphysema. There is no edema, consolidation, effusion, or pneumothorax. Normal heart size and aortic contours. Asymmetric density over the right hilum  attributed to the ascending aorta and accentuated by rightward rotation. IMPRESSION: Emphysema without acute superimposed finding. Electronically Signed   By: Monte Fantasia M.D.   On: 01/31/2015 12:17    IMPRESSION AND  PLAN:  * Acute respiratory failure with COPD extubation  Tried oral steroid antibiotic as outpatient 1 week ago.  IV steroid, nebulizer, Spiriva, IV Rocephin and azithromycin.  Pulmonary consult.  * Hypertension  Continue losartan.  * Hyperlipidemia  Continue statins.  * Smoking  Counseled to quit smoking for 4 minutes and offered nicotine patch.  * Prostatitis and prostate issues in the past.  He takes finasteride at home continue that.  All the records are reviewed and case discussed with ED provider. Management plans discussed with the patient, family and they are in agreement.  CODE STATUS: Full code    TOTAL TIME TAKING CARE OF THIS PATIENT: 50 minutes.    Vaughan Basta M.D on 01/31/2015   Between 7am to 6pm - Pager - [phone]  After 6pm go to [email or web address] - password [email or web address] [facility]  Office  [phone]  CC: Primary care physician; Arrie Aran PATRICIA, DO   Note: This dictation was prepared with Dragon dictation along with smaller phrase technology. Any transcriptional errors that result from this process are unintentional.

## 2015-01-31 NOTE — ED Provider Notes (Signed)
[facility] Emergency Department Provider Note  ____________________________________________  Time seen: On arrival  I have reviewed the triage vital signs and the nursing notes.   HISTORY  Chief Complaint Shortness of Breath    HPI Joseph Peters is a 59 y.o. male who presents to the ED with complaints of shortness of breath. He reports he has been short of breath for several days and actually had a near-syncopal episode in the emergency department triage room. He was brought back rapidly and given severe wheezing bilaterally nebulizer started oxygen given and he quickly improved. He denies chest pain. He has a history of COPD. He does smoke cigarettes. There is no chills    Past Medical History  Diagnosis Date  . Depression   . COPD (chronic obstructive pulmonary disease) ([place])   . Benign hypertension 07/16/2014  . 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 Coccydynia 02/08/2012  . Chest pain on exertion 07/16/2014    Patient Active Problem List   Diagnosis Date Noted  . AAA (abdominal aortic aneurysm) ([place]) 11/23/2014  . Prostate cancer screening 11/02/2014  . Gross hematuria 11/02/2014  . Chest pain on exertion 07/16/2014  . Benign hypertension 07/16/2014  . 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 Coccydynia 02/08/2012  . 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 Gait disorder 02/08/2012    Past Surgical History  Procedure Laterality Date  . Knee surgery      right  . Cardiac catheterization      Current Outpatient Rx  Name  Route  Sig  Dispense  Refill  . albuterol (PROVENTIL HFA;VENTOLIN HFA) 108 (90 BASE) MCG/ACT inhaler   Inhalation   Inhale 2 puffs into the lungs every 6 (six) hours as needed.         Marland Kitchen aspirin 81 MG tablet   Oral   Take 81 mg by mouth daily.         . finasteride (PROSCAR) 5 MG tablet   Oral   Take 1 tablet (5 mg total) by mouth daily.   90 tablet   3   . FLUoxetine  (PROZAC) 20 MG capsule               . gabapentin (NEURONTIN) 300 MG capsule   Oral   Take 600 mg by mouth at bedtime.         Marland Kitchen HYDROcodone-acetaminophen (NORCO) 7.5-325 MG per tablet      TK 1-2 TS PO UP TO TID FOR PAIN CONTROL.      0   . losartan (COZAAR) 25 MG tablet               . nitroGLYCERIN (NITROSTAT) 0.4 MG SL tablet   Sublingual   Place under the tongue.         Marland Kitchen PARoxetine (PAXIL) 20 MG tablet   Oral   Take by mouth.         . simvastatin (ZOCOR) 40 MG tablet   Oral   Take 40 mg by mouth every evening.           Allergies Lidocaine; Penicillin g; and Penicillins  Family History  Problem Relation Age of Onset  . Cancer Mother   . COPD Father 55    black lung    Social History Social History  Substance Use Topics  . Smoking status: Current Every Day Smoker -- 1.00 packs/day for 30 years    Types: Cigarettes  . Smokeless tobacco: Never Used  .  Alcohol Use: 0.0 oz/week    0 Standard drinks or equivalent per week    Review of Systems  Constitutional: Negative for fever. Eyes: Negative for visual changes. ENT: Negative for sore throat Cardiovascular: Negative for chest pain. Respiratory: Positive for shortness of breath. Gastrointestinal: Negative for abdominal pain, vomiting and diarrhea. Genitourinary: Negative for dysuria. Musculoskeletal: Negative for back pain. Skin: Negative for rash. Neurological: Negative for headaches or focal weakness Psychiatric: Mild anxiety    ____________________________________________   PHYSICAL EXAM:  VITAL SIGNS: ED Triage Vitals  Enc Vitals Group     BP 01/31/15 1131 159/105 mmHg     Pulse Rate 01/31/15 1131 87     Resp 01/31/15 1131 17     Temp --      Temp src --      SpO2 01/31/15 1131 96 %     Weight 01/31/15 1131 130 lb (58.968 kg)     Height 01/31/15 1131 6' (1.829 m)     Head Cir --      Peak Flow --      Pain Score 01/31/15 1133 6     Pain Loc --      Pain Edu? --       Excl. in [place]? --      Constitutional: Alert and oriented. Ill-appearing Eyes: Conjunctivae are normal.  ENT   Head: Normocephalic and atraumatic.   Mouth/Throat: Mucous membranes are moist. Cardiovascular: Normal rate, regular rhythm. Normal and symmetric distal pulses are present in all extremities. No murmurs, rubs, or gallops. Respiratory: Positive tachypnea. wheezes scattered bilaterally with poor air movement. Gastrointestinal: Soft and non-tender in all quadrants. No distention. There is no CVA tenderness. Genitourinary: deferred Musculoskeletal: Nontender with normal range of motion in all extremities. No lower extremity tenderness nor edema. Neurologic:  Normal speech and language. No gross focal neurologic deficits are appreciated. Skin:  Skin is warm, dry and intact. No rash noted. Psychiatric: Mood and affect are normal. Patient exhibits appropriate insight and judgment.  ____________________________________________    LABS (pertinent positives/negatives)  Labs Reviewed  CBC - Abnormal; Notable for the following:    WBC 19.9 (*)    All other components within normal limits  COMPREHENSIVE METABOLIC PANEL - Abnormal; Notable for the following:    Sodium 134 (*)    Chloride 99 (*)    All other components within normal limits  BLOOD GAS, ARTERIAL - Abnormal; Notable for the following:    pH, Arterial 7.52 (*)    pCO2 arterial 27 (*)    pO2, Arterial 77 (*)    Allens test (pass/fail) POSITIVE (*)    All other components within normal limits  TROPONIN I    ____________________________________________   EKG  ED ECG REPORT I, Lavonia Drafts, the attending physician, personally viewed and interpreted this ECG.   Date: 01/31/2015  EKG Time: 11:19 AM  Rate: 90  Rhythm: normal sinus rhythm  Axis: Normal  Intervals:none  ST&T Change: Nonspecific   ____________________________________________    RADIOLOGY I have personally reviewed any xrays that  were ordered on this patient: No acute findings on chest x-ray  ____________________________________________   PROCEDURES  Procedure(s) performed: none  Critical Care performed: yes  CRITICAL CARE Performed by: Lavonia Drafts   Total critical care time: 30 minutes  Critical care time was exclusive of separately billable procedures and treating other patients.  Critical care was necessary to treat or prevent imminent or life-threatening deterioration.  Critical care was time spent personally by me on  the following activities: development of treatment plan with patient and/or surrogate as well as nursing, discussions with consultants, evaluation of patient's response to treatment, examination of patient, obtaining history from patient or surrogate, ordering and performing treatments and interventions, ordering and review of laboratory studies, ordering and review of radiographic studies, pulse oximetry and re-evaluation of patient's condition.   ____________________________________________   INITIAL IMPRESSION / ASSESSMENT AND PLAN / ED COURSE  Pertinent labs & imaging results that were available during my care of the patient were reviewed by me and considered in my medical decision making (see chart for details).  Patient presents in distress. Near syncopal episode at nurses station with significant shortness of breath. Exam is consistent with COPD. Improvement with multiple nebs and oxygen and steroids. ABG shows no hypercapnia but hypoxia. I will admit the patient for further treatment  ____________________________________________   FINAL CLINICAL IMPRESSION(S) / ED DIAGNOSES  Final diagnoses:  COPD exacerbation ([place])     Lavonia Drafts, MD 01/31/15 1349

## 2015-02-01 LAB — BASIC METABOLIC PANEL
ANION GAP: 12 (ref 5–15)
BUN: 18 mg/dL (ref 6–20)
CHLORIDE: 102 mmol/L (ref 101–111)
CO2: 22 mmol/L (ref 22–32)
Calcium: 9.1 mg/dL (ref 8.9–10.3)
Creatinine, Ser: 1.08 mg/dL (ref 0.61–1.24)
GFR calc Af Amer: >60 mL/min (ref >60)
GLUCOSE: 191 mg/dL — AB (ref 65–99)
POTASSIUM: 3.5 mmol/L (ref 3.5–5.1)
Sodium: 136 mmol/L (ref 135–145)

## 2015-02-01 LAB — CBC
HEMATOCRIT: 41.9 % (ref 40.0–52.0)
HEMOGLOBIN: 14.3 g/dL (ref 13.0–18.0)
MCH: 33.7 pg (ref 26.0–34.0)
MCHC: 34.1 g/dL (ref 32.0–36.0)
MCV: 98.7 fL (ref 80.0–100.0)
Platelets: 153 10*3/uL (ref 150–440)
RBC: 4.25 MIL/uL — ABNORMAL LOW (ref 4.40–5.90)
RDW: 13.7 % (ref 11.5–14.5)
WBC: 14.9 10*3/uL — ABNORMAL HIGH (ref 3.8–10.6)

## 2015-02-01 MED ORDER — NICOTINE 21 MG/24HR TD PT24: 21.0000 mg | MEDICATED_PATCH | Freq: Every day | TRANSDERMAL | Status: DC

## 2015-02-01 MED ORDER — PREDNISONE 50 MG PO TABS: ORAL_TABLET | ORAL | Status: DC

## 2015-02-01 MED ORDER — LEVOFLOXACIN 500 MG PO TABS: 500.0000 mg | ORAL_TABLET | Freq: Every day | ORAL | Status: DC

## 2015-02-01 MED ORDER — MOMETASONE FURO-FORMOTEROL FUM 200-5 MCG/ACT IN AERO: 2.0000 | INHALATION_SPRAY | Freq: Two times a day (BID) | RESPIRATORY_TRACT | Status: DC

## 2015-02-01 NOTE — Plan of Care (Addendum)
Problem: Pain Managment: Goal: General experience of comfort will improve Outcome: Progressing Pt did not verbalize any complaints.   Problem: Physical Regulation: Goal: Will remain free from infection Outcome: Progressing Hand and Respiratory hygiene encouraged  Problem: Activity: Goal: Risk for activity intolerance will decrease Outcome: Progressing Minimal dyspnea with exertion  Problem: Nutrition: Goal: Adequate nutrition will be maintained Outcome: Progressing Pt verbalized that he had a good appetite.     Pt with diminished lung sounds. Productive cough. Denies pain. Spouse at bedside. Oxygen saturations 96% on 2 liters. Some dyspnea with exertion. Discussed smoking cessation with patient. He said he was going to ask the doctor for something at discharge. Declined written educational materials. Pt has Nicoderm patch in place

## 2015-02-01 NOTE — Progress Notes (Signed)
Date: 02/01/2015,   MRN# [id number] Nickoli Bagheri Aug 16, 1955 Code Status:     Code Status Orders        Start     Ordered   01/31/15 1609  Full code   Continuous     01/31/15 1608     Hosp day:'@LENGTHOFSTAYDAYS'$ @ Referring MD: '@ATDPROV'$ @      CC: copd exacerbation  HPI: This is a 59 yr old male, hx or cigarette and etoh abuse, known hx of copd, came in with wheezing, sob (" I could not get my breath.") Since being here he is almost to his baseline. He is on disability, no 02 at home. Recently placed on spiriva, completing a pred taper in addition to albuterol. He had an increase level exposure when he was working, was to sheet rock dust. His chest xray is void of infiltrates, edema or pleural effusion.      PMHX:   Past Medical History  Diagnosis Date  . Depression   . COPD (chronic obstructive pulmonary disease) ([place])   . Benign hypertension 07/16/2014  . 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 Coccydynia 02/08/2012  . Chest pain on exertion 07/16/2014   Surgical Hx:  Past Surgical History  Procedure Laterality Date  . Knee surgery      right  . Cardiac catheterization     Family Hx:  Family History  Problem Relation Age of Onset  . Cancer Mother   . COPD Father 87    black lung   Social Hx:   Social History  Substance Use Topics  . Smoking status: Current Every Day Smoker -- 1.00 packs/day for 30 years    Types: Cigarettes  . Smokeless tobacco: Never Used  . Alcohol Use: 0.0 oz/week    0 Standard drinks or equivalent per week   Medication:    Home Medication:  Current Outpatient Rx  Name  Route  Sig  Dispense  Refill  . levofloxacin (LEVAQUIN) 500 MG tablet   Oral   Take 1 tablet (500 mg total) by mouth daily.   4 tablet   0   . mometasone-formoterol (DULERA) 200-5 MCG/ACT AERO   Inhalation   Inhale 2 puffs into the lungs 2 (two) times daily.   1 Inhaler   2   . nicotine (NICODERM CQ - DOSED IN MG/24 HOURS) 21 mg/24hr patch    Transdermal   Place 1 patch (21 mg total) onto the skin daily.   28 patch   0   . predniSONE (DELTASONE) 50 MG tablet      Take one tablet daily for 5 days and then stop.   5 tablet   0     Current Medication: '@CURMEDTAB'$ @   Allergies:  Lidocaine and Penicillins  Review of Systems: Gen:  Denies  fever, sweats, chills HEENT: Denies blurred vision, double vision, ear pain, eye pain, hearing loss, nose bleeds, sore throat Cvc:  No dizziness, chest pain or heaviness Resp:  No sob, occasional wheezing, no hemoptysis  Gi: Denies swallowing difficulty, stomach pain, nausea or vomiting, diarrhea, constipation, bowel incontinence Gu:  Denies bladder incontinence, burning urine Ext:   No Joint pain, stiffness or swelling Skin: No skin rash, easy bruising or bleeding or hives Endoc:  No polyuria, polydipsia , polyphagia or weight change Psych: No depression, insomnia or hallucinations  Other:  All other systems negative  Physical Examination:   VS: BP 125/74 mmHg  Pulse 111  Temp(Src) 98.2 F (36.8 C) (Oral)  Resp 20  Ht 6' (1.829 m)  Wt 130 lb (58.968 kg)  BMI 17.63 kg/m2  SpO2 96%  General Appearance: No distress, small frame, audible wheezing   Neuro/psych without focal findings, mental status, speech normal, alert and oriented, cranial nerves 2-12 intact, reflexes normal and symmetric, sensation grossly normal  HEENT: PERRLA, EOM intact, no ptosis, no other lesions noticed NECK: Supple, no jvd, no stridor Pulmonary:.no wheezing, No rales no crackles   Cardiovascular:  Normal S1,S2.  No m/r/g.      Abdomen:Benign, Soft, non-tender, No masses, hepatosplenomegaly, No lymphadenopathy Endoc: No evident thyromegaly, no signs of acromegaly or Cushing features Skin:   warm, no rashes, no ecchymosis  Extremities: normal, no cyanosis, clubbing, no edema, warm with normal capillary refill.    Labs results:   Recent Labs     01/31/15  1123  02/01/15  0420  HGB  15.7  14.3   HCT  45.7  41.9  MCV  97.2  98.7  WBC  19.9*  14.9*  BUN  12  18  CREATININE  0.85  1.08  GLUCOSE  92  191*  CALCIUM  9.2  9.1  ,  Rad results:   Dg Shoulder Left  01/31/2015  CLINICAL DATA:  Acute onset of shoulder pain, left shoulder EXAM: LEFT SHOULDER - 2+ VIEW COMPARISON:  None. FINDINGS: There is no evidence of fracture or dislocation. There is no evidence of arthropathy or other focal bone abnormality. Soft tissues are unremarkable. IMPRESSION: Negative. Electronically Signed   By: Lucrezia Europe M.D.   On: 01/31/2015 17:37   INTERPRETATION POSSIBLE BICUSPID AORTIC VALVE Mobility: PARTIALLY MOBILE Morphology: MODERATELY THICKENED Aortic: TRIVIAL AR Tricuspid: TRIVIAL TR Closest EF: 50% (Estimated) Contraction: MILD GLOBAL DECREASE Mitral: TRIVIAL MR NTI:[id number] AS Size: MILDLY ENLARGED RVSP 27.3  Electronically signed by: MD Jordan Hawks on 10/22/2014 08:36 AM Performed By: Johnathan Hausen, RDCS, RVT Ordering Physician: Bartholome Bill  CLINICAL DATA: Passed out. Dyspnea  EXAM: PORTABLE CHEST 1 VIEW  COMPARISON: 07/15/2014  FINDINGS: Hyperinflation, interstitial coarsening, and apical lucency consistent with COPD/emphysema. There is no edema, consolidation, effusion, or pneumothorax. Normal heart size and aortic contours. Asymmetric density over the right hilum attributed to the ascending aorta and accentuated by rightward rotation.  IMPRESSION: Emphysema without acute superimposed finding.   Electronically Signed  By: Monte Fantasia M.D.  On: 01/31/2015 12:17  Assessment and Plan: HX and hospital course is c/w acute copd flare, much better today At a minimum spiriva, albuterol Pred taper Seven day course of emperic Follow up with pulmonary in one week with pfts Maybe able to go home today  Nicotine addiction Nicotine patches Advised to get off cigarettes  ETOH abuse Advised to get of etoh      I have personally obtained a history,  examined the patient, evaluated laboratory and imaging results, formulated the assessment and plan and placed orders.  The Patient requires high complexity decision making for assessment and support, frequent evaluation and titration of therapies, application of advanced monitoring technologies and extensive interpretation of multiple databases.   Herbon Fleming,M.D. Pulmonary & Critical care Medicine [facility]

## 2015-02-01 NOTE — Discharge Summary (Signed)
[place] [facility] [place] NAME: Kiren Mcisaac    MR#:  [id number]  DATE OF BIRTH:  19-May-1955  DATE OF ADMISSION:  01/31/2015 ADMITTING PHYSICIAN: Vaughan Basta, MD  DATE OF DISCHARGE: 02/01/2015  PRIMARY CARE PHYSICIAN: Arrie Aran PATRICIA, DO    ADMISSION DIAGNOSIS:  COPD exacerbation ([place]) [J44.1]  DISCHARGE DIAGNOSIS:  Principal Problem:   COPD exacerbation ([place]) Active Problems:   Acute respiratory failure ([place])   SECONDARY DIAGNOSIS:   Past Medical History  Diagnosis Date  . Depression   . COPD (chronic obstructive pulmonary disease) ([place])   . Benign hypertension 07/16/2014  . 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 Coccydynia 02/08/2012  . Chest pain on exertion 07/16/2014    HOSPITAL COURSE:   1. Acute COPD exacerbation: Treated with standard therapy of IV Solu-Medrol, DuoNeb's, antibiotics overnight. Chest x-ray with COPD with no superimposed findings. Doing well this morning with no need for supplemental oxygen even with ambulation. Discharge on Levaquin, prednisone 50 mg 5 days, Spiriva, I have added Dulera to his regular regimen. He has an albuterol rescue inhaler. He will follow-up with his primary care provider. He may need outpatient referral to pulmonology.  2. Hypertension: Blood pressure controlled continue losartan  3. Hyperlipidemia: Continue statins  4. Ongoing tobacco abuse: Counseled to quit smoking. Nicotine patch during hospitalization and prescription provided.  5. BPH: Continue finasteride  6. Left shoulder pain: X-rays with no bony abnormalities. States that he fell last week. States that at home he has had multiple falls. He has had physical therapy for this and will continue to follow with his primary care provider for follow-up reduction.  DISCHARGE CONDITIONS:   Stable  CONSULTS OBTAINED:     None  DRUG ALLERGIES:   Allergies   Allergen Reactions  . Lidocaine Anaphylaxis  . Penicillins Rash and Other (See Comments)    Has patient had a PCN reaction causing immediate rash, facial/tongue/throat swelling, SOB or lightheadedness with hypotension: No Has patient had a PCN reaction causing severe rash involving mucus membranes or skin necrosis: No Has patient had a PCN reaction that required hospitalization No Has patient had a PCN reaction occurring within the last 10 years: No If all of the above answers are "NO", then may proceed with Cephalosporin use.     DISCHARGE MEDICATIONS:   Current Discharge Medication List    START taking these medications   Details  levofloxacin (LEVAQUIN) 500 MG tablet Take 1 tablet (500 mg total) by mouth daily. Qty: 4 tablet, Refills: 0    mometasone-formoterol (DULERA) 200-5 MCG/ACT AERO Inhale 2 puffs into the lungs 2 (two) times daily. Qty: 1 Inhaler, Refills: 2    nicotine (NICODERM CQ - DOSED IN MG/24 HOURS) 21 mg/24hr patch Place 1 patch (21 mg total) onto the skin daily. Qty: 28 patch, Refills: 0    predniSONE (DELTASONE) 50 MG tablet Take one tablet daily for 5 days and then stop. Qty: 5 tablet, Refills: 0      CONTINUE these medications which have NOT CHANGED   Details  aspirin EC 81 MG tablet Take 81 mg by mouth at bedtime.     finasteride (PROSCAR) 5 MG tablet Take 1 tablet (5 mg total) by mouth daily. Qty: 90 tablet, Refills: 3   Associated Diagnoses: Gross hematuria    FLUoxetine (PROZAC) 20 MG capsule Take 20 mg by mouth at bedtime.     gabapentin (NEURONTIN) 300 MG capsule Take 900  mg by mouth at bedtime.     losartan (COZAAR) 25 MG tablet Take 25 mg by mouth at bedtime.     nortriptyline (PAMELOR) 25 MG capsule Take 25 mg by mouth at bedtime.    simvastatin (ZOCOR) 40 MG tablet Take 40 mg by mouth at bedtime.     tiotropium (SPIRIVA) 18 MCG inhalation capsule Place 18 mcg into inhaler and inhale at bedtime.     nitroGLYCERIN (NITROSTAT) 0.4 MG SL  tablet Place 0.4 mg under the tongue every 5 (five) minutes as needed for chest pain.          DISCHARGE INSTRUCTIONS:   Regular diet, fair condition, activity as tolerated, no home oxygen. Being discharged to home.   If you experience worsening of your admission symptoms, develop shortness of breath, life threatening emergency, suicidal or homicidal thoughts you must seek medical attention immediately by calling 911 or calling your MD immediately  if symptoms less severe.  You Must read complete instructions/literature along with all the possible adverse reactions/side effects for all the Medicines you take and that have been prescribed to you. Take any new Medicines after you have completely understood and accept all the possible adverse reactions/side effects.   Please note  You were cared for by a hospitalist during your hospital stay. If you have any questions about your discharge medications or the care you received while you were in the hospital after you are discharged, you can call the unit and asked to speak with the hospitalist on call if the hospitalist that took care of you is not available. Once you are discharged, your primary care physician will handle any further medical issues. Please note that NO REFILLS for any discharge medications will be authorized once you are discharged, as it is imperative that you return to your primary care physician (or establish a relationship with a primary care physician if you do not have one) for your aftercare needs so that they can reassess your need for medications and monitor your lab values.    Today   CHIEF COMPLAINT:   Chief Complaint  Patient presents with  . Shortness of Breath    HISTORY OF PRESENT ILLNESS:  Abdulai Blaylock is a 59 y.o. male with a known history of depression, COPD, benign hypertension,- for last 2 weeks as complain of shortness of breath and some runny nose, he was also feeling short of breath so 2 weeks ago  was given a dose of oral steroid and the antibiotic treatment which he took for one week but did not improve enough. He continued to feel short of breath and some tightness in the chest throughout the week and last 2 days he felt severely worse and short of breath so finally decided to come to emergency room today. He had tachypnea and hypoxia in ER and wheezing so given to hospitalist team for COPD exacerbation admission.  VITAL SIGNS:  Blood pressure 125/74, pulse 111, temperature 98.2 F (36.8 C), temperature source Oral, resp. rate 20, height 6' (1.829 m), weight 58.968 kg (130 lb), SpO2 96 %.  I/O:   Intake/Output Summary (Last 24 hours) at 02/01/15 1625 Last data filed at 02/01/15 1200  Gross per 24 hour  Intake    480 ml  Output   1600 ml  Net  -1120 ml    PHYSICAL EXAMINATION:  GENERAL:  59 y.o.-year-old patient lying in the bed with no acute distress. Thin LUNGS: Normal breath sounds bilaterally, no wheezing, rales,rhonchi or crepitation. No  use of accessory muscles of respiration. Good air movement CARDIOVASCULAR: S1, S2 normal. No murmurs, rubs, or gallops.  ABDOMEN: Soft, non-tender, non-distended. Bowel sounds present. No organomegaly or mass.  EXTREMITIES: No pedal edema, cyanosis, or clubbing. No deformity of the left shoulder, normal range of motion NEUROLOGIC: Cranial nerves II through XII are intact. Muscle strength 5/5 in all extremities. Sensation intact. Gait not checked.  PSYCHIATRIC: The patient is alert and oriented x 3.  SKIN: No obvious rash, lesion, or ulcer.   DATA REVIEW:   CBC  Recent Labs Lab 02/01/15 0420  WBC 14.9*  HGB 14.3  HCT 41.9  PLT 153    Chemistries   Recent Labs Lab 01/31/15 1123 02/01/15 0420  NA 134* 136  K 4.0 3.5  CL 99* 102  CO2 26 22  GLUCOSE 92 191*  BUN 12 18  CREATININE 0.85 1.08  CALCIUM 9.2 9.1  AST 29  --   ALT 32  --   ALKPHOS 90  --   BILITOT 0.7  --     Cardiac Enzymes  Recent Labs Lab  01/31/15 1123  TROPONINI <0.03    Microbiology Results  Results for orders placed or performed in visit on 11/02/14  Microscopic Examination     Status: Abnormal   Collection Time: 11/02/14  4:02 PM  Result Value Ref Range Status   WBC, UA 0-5 0 -  5 /hpf Final   RBC, UA None seen 0 -  2 /hpf Final   Epithelial Cells (non renal) None seen 0 - 10 /hpf Final   Mucus, UA Present (A) Not Estab. Final   Bacteria, UA None seen None seen/Few Final    RADIOLOGY:  Dg Chest Portable 1 View  01/31/2015  CLINICAL DATA:  Passed out.  Dyspnea EXAM: PORTABLE CHEST 1 VIEW COMPARISON:  07/15/2014 FINDINGS: Hyperinflation, interstitial coarsening, and apical lucency consistent with COPD/emphysema. There is no edema, consolidation, effusion, or pneumothorax. Normal heart size and aortic contours. Asymmetric density over the right hilum attributed to the ascending aorta and accentuated by rightward rotation. IMPRESSION: Emphysema without acute superimposed finding. Electronically Signed   By: Monte Fantasia M.D.   On: 01/31/2015 12:17   Dg Shoulder Left  01/31/2015  CLINICAL DATA:  Acute onset of shoulder pain, left shoulder EXAM: LEFT SHOULDER - 2+ VIEW COMPARISON:  None. FINDINGS: There is no evidence of fracture or dislocation. There is no evidence of arthropathy or other focal bone abnormality. Soft tissues are unremarkable. IMPRESSION: Negative. Electronically Signed   By: Lucrezia Europe M.D.   On: 01/31/2015 17:37    EKG:   Orders placed or performed during the hospital encounter of 01/31/15  . ED EKG  . ED EKG  . EKG 12-Lead  . EKG 12-Lead      Management plans discussed with the patient, family and they are in agreement.  CODE STATUS:     Code Status Orders        Start     Ordered   01/31/15 1609  Full code   Continuous     01/31/15 1608      TOTAL TIME TAKING CARE OF THIS PATIENT: 35 minutes.  Greater than 50% of time spent in care coordination and counseling.  Myrtis Ser M.D on 02/01/2015 at 4:25 PM  Between 7am to 6pm - Pager - [phone]  After 6pm go to [email or web address] - password [email or web address] [place] [facility]  Office  [phone]  CC: Primary care physician; Arrie Aran  PATRICIA, DO

## 2015-02-01 NOTE — Progress Notes (Signed)
Inpatient Diabetes Program Recommendations  AACE/ADA: New Consensus Statement on Inpatient Glycemic Control (2015)  Target Ranges:  Prepandial:   less than 140 mg/dL      Peak postprandial:   less than 180 mg/dL (1-2 hours)      Critically ill patients:  140 - 180 mg/dL   Review of Glycemic Control:  Results for RAYVEN, HENDRICKSON (MRN [id number]) as of 02/01/2015 11:19  Ref. Range 01/31/2015 11:23 02/01/2015 04:20  Glucose Latest Ref Range: 65-99 mg/dL 92 191 (H)   Diabetes history: None  Inpatient Diabetes Program Recommendations:    Note that patient is on IV steroids.  May consider checking blood sugars tid with meals and HS.  If greater than 150 mg/dL, consider Novolog correction tid with meals while on steroids.  Thanks,  Adah Perl, RN, BC-ADM Inpatient Diabetes Coordinator Pager [phone] (8a-5p)

## 2015-02-02 MED ORDER — NICOTINE 21 MG/24HR TD PT24: 21.0000 mg | MEDICATED_PATCH | Freq: Every day | TRANSDERMAL | Status: DC

## 2015-02-02 NOTE — Care Management Note (Signed)
Case Management Note  Patient Details  Name: Joseph Peters MRN: [id number] Date of Birth: Jul 03, 1955  Subjective/Objective:  Admitted with COPD exacerbation. Spoke with patient and his wife to discuss discharge planning. Patient lives at home with his wife. He is independent with no DME but on disability secondary to work related accident and TBI. He states he has medicare and medicaid and is able to pay for medications without difficulty. PCP is at [facility] family [facility]. No home O2 indicated at this time. Denies issues obtaining medical care or transportation.                     Action/Plan: No needs identified.   Expected Discharge Date:                  Expected Discharge Plan:  Home/Self Care  In-House Referral:     Discharge planning Services  CM Consult  Post Acute Care Choice:    Choice offered to:     DME Arranged:    DME Agency:     HH Arranged:    [place] Agency:     Status of Service:  Completed, signed off  Medicare Important Message Given:    Date Medicare IM Given:    Medicare IM give by:    Date Additional Medicare IM Given:    Additional Medicare Important Message give by:     If discussed at [place] of Stay Meetings, dates discussed:    Additional Comments:  Joseph Mango, RN 02/02/2015, 9:26 AM

## 2015-02-02 NOTE — Discharge Instructions (Signed)
DIET:  Regular diet  DISCHARGE CONDITION:  Fair  ACTIVITY:  Activity as tolerated  OXYGEN:  Home Oxygen: No.   Oxygen Delivery: room air  DISCHARGE LOCATION:  home   If you experience worsening of your admission symptoms, develop shortness of breath, life threatening emergency, suicidal or homicidal thoughts you must seek medical attention immediately by calling 911 or calling your MD immediately  if symptoms less severe.  You Must read complete instructions/literature along with all the possible adverse reactions/side effects for all the Medicines you take and that have been prescribed to you. Take any new Medicines after you have completely understood and accpet all the possible adverse reactions/side effects.   Please note  You were cared for by a hospitalist during your hospital stay. If you have any questions about your discharge medications or the care you received while you were in the hospital after you are discharged, you can call the unit and asked to speak with the hospitalist on call if the hospitalist that took care of you is not available. Once you are discharged, your primary care physician will handle any further medical issues. Please note that NO REFILLS for any discharge medications will be authorized once you are discharged, as it is imperative that you return to your primary care physician (or establish a relationship with a primary care physician if you do not have one) for your aftercare needs so that they can reassess your need for medications and monitor your lab values.    Asthma Attack Prevention While you may not be able to control the fact that you have asthma, you can take actions to prevent asthma attacks. The best way to prevent asthma attacks is to maintain good control of your asthma. You can achieve this by:  Taking your medicines as directed.  Avoiding things that can irritate your airways or make your asthma symptoms worse (asthma  triggers).  Keeping track of how well your asthma is controlled and of any changes in your symptoms.  Responding quickly to worsening asthma symptoms (asthma attack).  Seeking emergency care when it is needed. WHAT ARE SOME WAYS TO PREVENT AN ASTHMA ATTACK? Have a Plan Work with your health care provider to create a written plan for managing and treating your asthma attacks (asthma action plan). This plan includes:  A list of your asthma triggers and how you can avoid them.  Information on when medicines should be taken and when their dosages should be changed.  The use of a device that measures how well your lungs are working (peak flow meter). Monitor Your Asthma Use your peak flow meter and record your results in a journal every day. A drop in your peak flow numbers on one or more days may indicate the start of an asthma attack. This can happen even before you start to feel symptoms. You can prevent an asthma attack from getting worse by following the steps in your asthma action plan. Avoid Asthma Triggers Work with your asthma health care provider to find out what your asthma triggers are. This can be done by:  Allergy testing.  Keeping a journal that notes when asthma attacks occur and the factors that may have contributed to them.  Determining if there are other medical conditions that are making your asthma worse. Once you have determined your asthma triggers, take steps to avoid them. This may include avoiding excessive or prolonged exposure to:  Dust. Have someone dust and vacuum your home for you once or  twice a week. Using a high-efficiency particulate arrestance (HEPA) vacuum is best.  Smoke. This includes campfire smoke, forest fire smoke, and secondhand smoke from tobacco products.  Pet dander. Avoid contact with animals that you know you are allergic to.  Allergens from trees, grasses or pollens. Avoid spending a lot of time outdoors when pollen counts are high, and  on very windy days.  Very cold, dry, or humid air.  Mold.  Foods that contain high amounts of sulfites.  Strong odors.  Outdoor air pollutants, such as Lexicographer.  Indoor air pollutants, such as aerosol sprays and fumes from household cleaners.  Household pests, including dust mites and cockroaches, and pest droppings.  Certain medicines, including NSAIDs. Always talk to your health care provider before stopping or starting any new medicines. Medicines Take over-the-counter and prescription medicines only as told by your health care provider. Many asthma attacks can be prevented by carefully following your medicine schedule. Taking your medicines correctly is especially important when you cannot avoid certain asthma triggers. Act Quickly If an asthma attack does happen, acting quickly can decrease how severe it is and how long it lasts. Take these steps:   Pay attention to your symptoms. If you are coughing, wheezing, or having difficulty breathing, do not wait to see if your symptoms go away on their own. Follow your asthma action plan.  If you have followed your asthma action plan and your symptoms are not improving, call your health care provider or seek immediate medical care at the nearest hospital. It is important to note how often you need to use your fast-acting rescue inhaler. If you are using your rescue inhaler more often, it may mean that your asthma is not under control. Adjusting your asthma treatment plan may help you to prevent future asthma attacks and help you to gain better control of your condition. HOW CAN I PREVENT AN ASTHMA ATTACK WHEN I EXERCISE? Follow advice from your health care provider about whether you should use your fast-acting inhaler before exercising. Many people with asthma experience exercise-induced bronchoconstriction (EIB). This condition often worsens during vigorous exercise in cold, humid, or dry environments. Usually, people with EIB can  stay very active by pre-treating with a fast-acting inhaler before exercising.   This information is not intended to replace advice given to you by your health care provider. Make sure you discuss any questions you have with your health care provider.   Document Released: 01/31/2009 Document Revised: 11/03/2014 Document Reviewed: 07/15/2014 Elsevier Interactive Patient Education [employer].

## 2015-02-02 NOTE — Care Management Important Message (Signed)
Important Message  Patient Details  Name: Joseph Peters MRN: [id number] Date of Birth: 1955-03-24   Medicare Important Message Given:  Yes    Juliann Pulse A Tru Leopard 02/02/2015, 9:42 AM

## 2015-02-02 NOTE — Progress Notes (Signed)
Pt for discharge home. Alert. No resp distress. sats  Maintains  Mid 90s.  Discharge instructions discussed with pt.  Home meds discussed/ presc given and discussed. Diet activity and f/u discussed. verbalizu understanding of all.  Home at this time via w/c  W/o c/o.

## 2015-10-15 ENCOUNTER — Encounter (INDEPENDENT_AMBULATORY_CARE_PROVIDER_SITE_OTHER): Payer: Self-pay

## 2015-10-15 DIAGNOSIS — I10 Essential (primary) hypertension: Secondary | ICD-10-CM | POA: Insufficient documentation

## 2015-11-09 ENCOUNTER — Encounter (INDEPENDENT_AMBULATORY_CARE_PROVIDER_SITE_OTHER): Payer: Self-pay | Admitting: Vascular Surgery

## 2015-11-21 ENCOUNTER — Other Ambulatory Visit: Payer: Self-pay | Admitting: Cardiology

## 2015-11-21 DIAGNOSIS — I714 Abdominal aortic aneurysm, without rupture, unspecified: Secondary | ICD-10-CM

## 2015-11-23 ENCOUNTER — Encounter: Payer: Self-pay | Admitting: Urology

## 2015-11-23 ENCOUNTER — Ambulatory Visit (INDEPENDENT_AMBULATORY_CARE_PROVIDER_SITE_OTHER): Payer: Medicare Other | Admitting: Urology

## 2015-11-23 VITALS — BP 137/86 | HR 61 | Ht 66.0 in | Wt 131.4 lb

## 2015-11-23 DIAGNOSIS — R310 Gross hematuria: Secondary | ICD-10-CM

## 2015-11-23 DIAGNOSIS — N400 Enlarged prostate without lower urinary tract symptoms: Secondary | ICD-10-CM | POA: Diagnosis not present

## 2015-11-23 DIAGNOSIS — Z125 Encounter for screening for malignant neoplasm of prostate: Secondary | ICD-10-CM

## 2015-11-23 DIAGNOSIS — R3911 Hesitancy of micturition: Secondary | ICD-10-CM | POA: Diagnosis not present

## 2015-11-23 LAB — BLADDER SCAN AMB NON-IMAGING: Scan Result: 59

## 2015-11-23 MED ORDER — TAMSULOSIN HCL 0.4 MG PO CAPS: 0.4000 mg | ORAL_CAPSULE | Freq: Every day | ORAL | 11 refills | Status: DC

## 2015-11-23 NOTE — Progress Notes (Signed)
11/23/2015 10:05 AM   Joseph Peters 1955-10-13 [id number]  Referring provider: Letta Median, MD [place], [place] [id number]  Chief Complaint  Patient presents with  . Follow-up    gross hematuria    HPI: 1 - Gross Hematuria - visible terminal hematuria x several mos. 40PY smoker, still smokes. CT Urogram 10/2014 w/o GU mass / stone. Cysto 10/2014 with significant bilobar prostatic hypertrophy with varices. He was started on finasteride for prostatic bleeding.   2 - Prostate Screening - no FHX prostate cancer. 10/2014: PSA 0.4 / DRE 40gm smooth  3 - Lower Urinary Tract Symptoms / Dysuria - pt with sensation of "peeing glass" when hematuria present. PVR 10/2014 "63m", DRE 40gm. UA w/o bacteruria at time. Started finasteride 2016 as above.   4 - Abdominal Aortic Aneurysm - incidental 3.2cm infrarenal AAA by hematuria CT 2016. He is smoker.   Today, pt seen for the above, he's had no gross hematuria. He has IPSS of 13 mainly due to hesitancy. PVR nl again today. He continues finasteride. He has CT angio pending Oct 5th.   PMH: Past Medical History:  Diagnosis Date  . Benign hypertension 07/16/2014  . Chest pain on exertion 07/16/2014  . Coccydynia 02/08/2012  . COPD (chronic obstructive pulmonary disease) ([facility]   . Depression   . 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 CARDIAC CATHETERIZATION    . KNEE SURGERY     right    Home Medications:    Medication List       Accurate as of 11/23/15 10:05 AM. Always use your most recent med list.          albuterol 108 (90 Base) MCG/ACT inhaler Commonly known as:  PROVENTIL HFA;VENTOLIN HFA Inhale 2 puffs into the lungs.   aspirin EC 81 MG tablet Take 81 mg by mouth at bedtime.   diazepam 5 MG tablet Commonly known as:  VALIUM   finasteride 5 MG tablet Commonly known as:  PROSCAR Take 1 tablet  (5 mg total) by mouth daily.   FLUoxetine 20 MG capsule Commonly known as:  PROZAC Take 20 mg by mouth at bedtime.   gabapentin 300 MG capsule Commonly known as:  NEURONTIN Take 900 mg by mouth at bedtime.   HYDROcodone-acetaminophen 7.5-325 MG tablet Commonly known as:  NORCO   losartan 25 MG tablet Commonly known as:  COZAAR Take 25 mg by mouth at bedtime.   mometasone-formoterol 200-5 MCG/ACT Aero Commonly known as:  DULERA Inhale 2 puffs into the lungs 2 (two) times daily.   nicotine 21 mg/24hr patch Commonly known as:  NICODERM CQ - dosed in mg/24 hours Place 1 patch (21 mg total) onto the skin daily.   nitroGLYCERIN 0.4 MG SL tablet Commonly known as:  NITROSTAT Place 0.4 mg under the tongue every 5 (five) minutes as needed for chest pain.   nortriptyline 25 MG capsule Commonly known as:  PAMELOR Take 25 mg by mouth at bedtime.   PARoxetine 20 MG tablet Commonly known as:  PAXIL Take 20 mg by mouth daily.   simvastatin 40 MG tablet Commonly known as:  ZOCOR Take 40 mg by mouth at bedtime.   tiotropium 18 MCG inhalation capsule Commonly known as:  SPIRIVA Place 18 mcg into inhaler and inhale at bedtime.       Allergies:  Allergies  Allergen Reactions  . Lidocaine Anaphylaxis  .  Penicillins Rash and Other (See Comments)    Has patient had a PCN reaction causing immediate rash, facial/tongue/throat swelling, SOB or lightheadedness with hypotension: No Has patient had a PCN reaction causing severe rash involving mucus membranes or skin necrosis: No Has patient had a PCN reaction that required hospitalization No Has patient had a PCN reaction occurring within the last 10 years: No If all of the above answers are "NO", then may proceed with Cephalosporin use.     Family History: Family History  Problem Relation Age of Onset  . Cancer Mother   . COPD Father 87    black lung    Social History:  reports that he has been smoking Cigarettes.  He has a  30.00 pack-year smoking history. He has never used smokeless tobacco. He reports that he drinks alcohol. He reports that he does not use drugs.  ROS: UROLOGY Frequent Urination?: No Hard to postpone urination?: No Burning/pain with urination?: Yes Get up at night to urinate?: No Leakage of urine?: No Urine stream starts and stops?: No Trouble starting stream?: Yes Do you have to strain to urinate?: Yes Blood in urine?: No Urinary tract infection?: No Sexually transmitted disease?: No Injury to kidneys or bladder?: No Painful intercourse?: No Weak stream?: Yes Erection problems?: No Penile pain?: No  Gastrointestinal Nausea?: No Vomiting?: No Indigestion/heartburn?: No Diarrhea?: No Constipation?: No  Constitutional Fever: No Night sweats?: No Weight loss?: No Fatigue?: No  Skin Skin rash/lesions?: No Itching?: No  Eyes Blurred vision?: No Double vision?: No  Ears/Nose/Throat Sore throat?: No Sinus problems?: No  Hematologic/Lymphatic Swollen glands?: No Easy bruising?: Yes  Cardiovascular Leg swelling?: No Chest pain?: No  Respiratory Cough?: Yes Shortness of breath?: Yes  Endocrine Excessive thirst?: Yes  Musculoskeletal Back pain?: Yes Joint pain?: Yes  Neurological Headaches?: No Dizziness?: No  Psychologic Depression?: Yes Anxiety?: No  Physical Exam: BP 137/86   Pulse 61   Ht '5\' 6"'$  (1.676 m)   Wt 59.6 kg (131 lb 6.4 oz)   BMI 21.21 kg/m   Constitutional:  Alert and oriented, No acute distress. HEENT: [place] AT, moist mucus membranes.  Trachea midline, no masses. Cardiovascular: No clubbing, cyanosis, or edema. Respiratory: Normal respiratory effort, no increased work of breathing. GI: Abdomen is soft, nontender, nondistended, no abdominal masses Skin: No rashes, bruises or suspicious lesions. Lymph: No cervical or inguinal adenopathy. Neurologic: Grossly intact, no focal deficits, moving all 4 extremities. Psychiatric: Normal  mood and affect. DRE: prostate 30 grams, smooth. No hard area or nodule.   Laboratory Data: Lab Results  Component Value Date   WBC 14.9 (H) 02/01/2015   HGB 14.3 02/01/2015   HCT 41.9 02/01/2015   MCV 98.7 02/01/2015   PLT 153 02/01/2015    Lab Results  Component Value Date   CREATININE 1.08 02/01/2015    No results found for: PSA  No results found for: TESTOSTERONE  No results found for: HGBA1C  Urinalysis    Component Value Date/Time   APPEARANCEUR Clear 11/02/2014 1602   GLUCOSEU Negative 11/02/2014 1602   BILIRUBINUR Negative 11/02/2014 1602   PROTEINUR Negative 11/02/2014 1602   NITRITE Negative 11/02/2014 1602   LEUKOCYTESUR Negative 11/02/2014 1602     Assessment & Plan:    1. Prostate cancer screening  - PSA - Bladder Scan (Post Void Residual) in office  2. Gross hematuria Resolved. Recheck UA in 1 year. He couldn't leave a specimen today.  - Urinalysis, Complete - PSA - Bladder Scan (Post Void  Residual) in office  3. Hesitancy - discussed nature r/b of adding tamsulosin to finasteride and he elects to proceed.   No Follow-up on file.  Festus Aloe, [facility] [address] [place], [place] [address] [phone]

## 2015-11-24 LAB — PSA: PROSTATE SPECIFIC AG, SERUM: 0.5 ng/mL (ref 0.0–4.0)

## 2015-11-25 ENCOUNTER — Telehealth: Payer: Self-pay

## 2015-11-25 ENCOUNTER — Other Ambulatory Visit: Payer: Self-pay

## 2015-11-25 DIAGNOSIS — E291 Testicular hypofunction: Secondary | ICD-10-CM

## 2015-11-25 NOTE — Telephone Encounter (Signed)
Notify patient his PSA is normal. Give result. Thanks- Dr. Junious Silk   [facility]

## 2015-11-28 NOTE — Telephone Encounter (Signed)
Spoke with pt in reference to PSA results. Pt voiced understanding.  

## 2015-12-01 ENCOUNTER — Ambulatory Visit
Admission: RE | Admit: 2015-12-01 | Discharge: 2015-12-01 | Disposition: A | Discharge status: Home / Self Care | Payer: Medicare Other | Source: Ambulatory Visit | Attending: Cardiology | Admitting: Cardiology

## 2015-12-01 DIAGNOSIS — I714 Abdominal aortic aneurysm, without rupture, unspecified: Secondary | ICD-10-CM

## 2015-12-01 DIAGNOSIS — I700 Atherosclerosis of aorta: Secondary | ICD-10-CM | POA: Diagnosis not present

## 2015-12-01 DIAGNOSIS — I513 Intracardiac thrombosis, not elsewhere classified: Secondary | ICD-10-CM | POA: Diagnosis not present

## 2015-12-01 DIAGNOSIS — I701 Atherosclerosis of renal artery: Secondary | ICD-10-CM | POA: Insufficient documentation

## 2015-12-01 HISTORY — DX: Unspecified asthma, uncomplicated: J45.909

## 2015-12-01 MED ORDER — IOPAMIDOL (ISOVUE-370) INJECTION 76%
75.0000 mL | Freq: Once | INTRAVENOUS | Status: AC | PRN
  Administered 2015-12-01: 75 mL via INTRAVENOUS

## 2015-12-02 ENCOUNTER — Other Ambulatory Visit (INDEPENDENT_AMBULATORY_CARE_PROVIDER_SITE_OTHER): Payer: Self-pay | Admitting: Vascular Surgery

## 2015-12-02 DIAGNOSIS — I714 Abdominal aortic aneurysm, without rupture, unspecified: Secondary | ICD-10-CM

## 2015-12-02 DIAGNOSIS — I739 Peripheral vascular disease, unspecified: Secondary | ICD-10-CM

## 2015-12-05 ENCOUNTER — Ambulatory Visit (INDEPENDENT_AMBULATORY_CARE_PROVIDER_SITE_OTHER): Payer: Medicare Other

## 2015-12-05 ENCOUNTER — Ambulatory Visit (INDEPENDENT_AMBULATORY_CARE_PROVIDER_SITE_OTHER): Payer: Medicare Other | Admitting: Vascular Surgery

## 2015-12-05 VITALS — BP 160/93 | HR 54 | Resp 16 | Ht 68.0 in | Wt 132.0 lb

## 2015-12-05 DIAGNOSIS — I739 Peripheral vascular disease, unspecified: Secondary | ICD-10-CM

## 2015-12-05 DIAGNOSIS — I714 Abdominal aortic aneurysm, without rupture, unspecified: Secondary | ICD-10-CM

## 2015-12-05 DIAGNOSIS — R55 Syncope and collapse: Secondary | ICD-10-CM | POA: Insufficient documentation

## 2015-12-05 DIAGNOSIS — I708 Atherosclerosis of other arteries: Secondary | ICD-10-CM | POA: Insufficient documentation

## 2015-12-05 DIAGNOSIS — I748 Embolism and thrombosis of other arteries: Secondary | ICD-10-CM

## 2015-12-05 DIAGNOSIS — I701 Atherosclerosis of renal artery: Secondary | ICD-10-CM | POA: Diagnosis not present

## 2015-12-05 DIAGNOSIS — I10 Essential (primary) hypertension: Secondary | ICD-10-CM | POA: Diagnosis not present

## 2015-12-05 DIAGNOSIS — M79609 Pain in unspecified limb: Secondary | ICD-10-CM

## 2015-12-05 NOTE — Progress Notes (Signed)
MRN : [id number]  Joseph Peters is a 60 y.o. (12/01/1955) male who presents with chief complaint of  Chief Complaint  Patient presents with  . Follow-up    ultrasound  .  History of Present Illness:  The patient presents to the office for evaluation of an abdominal aortic aneurysm. The Aneurysm was found incidentally by CT scan. CT was being done for hematuria. 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  He denies postprandial abdominal pain. He denies weight loss. He states his blood pressure has been "good" and his wife concurs.  No family history of AAA  Patient denies amaurosis fugax or TIA symptoms. There is no history of claudication or rest pain symptoms of the lower extremities. The patient denies angina or shortness of breath.  I personally reviewed the CT scan and my findings are as follows there is an AAA infrarenal that measures 3.38 cm previously it measured 3.2 cm. 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 There is a 70% right renal artery stenosis. There is no amount hemodynamically significant stenosis on the left.  ABI Rt= 1.06 and Lt= 1.09 Doppler signals are triphasic at the level of the ankles   Current Outpatient Prescriptions  Medication Sig Dispense Refill  . albuterol (PROVENTIL HFA;VENTOLIN HFA) 108 (90 Base) MCG/ACT inhaler Inhale 2 puffs into the lungs.    Marland Kitchen aspirin EC 81 MG tablet Take 81 mg by mouth at bedtime.     . diazepam (VALIUM) 5 MG tablet     . finasteride (PROSCAR) 5 MG tablet Take 1 tablet (5 mg total) by mouth daily. 90 tablet 3  . FLUoxetine (PROZAC) 20 MG capsule Take 20 mg by mouth at bedtime.     . gabapentin (NEURONTIN) 300 MG capsule Take 900 mg by mouth at bedtime.     Marland Kitchen  HYDROcodone-acetaminophen (NORCO) 7.5-325 MG tablet     . losartan (COZAAR) 25 MG tablet Take 25 mg by mouth at bedtime.     . mometasone-formoterol (DULERA) 200-5 MCG/ACT AERO Inhale 2 puffs into the lungs 2 (two) times daily. 1 Inhaler 2  . nicotine (NICODERM CQ - DOSED IN MG/24 HOURS) 21 mg/24hr patch Place 1 patch (21 mg total) onto the skin daily. 28 patch 0  . nitroGLYCERIN (NITROSTAT) 0.4 MG SL tablet Place 0.4 mg under the tongue every 5 (five) minutes as needed for chest pain.     . nortriptyline (PAMELOR) 25 MG capsule Take 25 mg by mouth at bedtime.    Marland Kitchen PARoxetine (PAXIL) 20 MG tablet Take 20 mg by mouth daily.    . simvastatin (ZOCOR) 40 MG tablet Take 40 mg by mouth at bedtime.     . tamsulosin (FLOMAX) 0.4 MG CAPS capsule Take 1 capsule (0.4 mg total) by mouth daily after supper. 30 capsule 11  . tiotropium (SPIRIVA) 18 MCG inhalation capsule Place 18 mcg into inhaler and inhale at bedtime.      No current facility-administered medications for this visit.     Past Medical History:  Diagnosis Date  . Asthma   . Benign hypertension 07/16/2014  . Chest pain on exertion 07/16/2014  . Coccydynia 02/08/2012  . COPD (chronic obstructive pulmonary disease) ([place])   . Depression   .  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 CARDIAC CATHETERIZATION    . KNEE SURGERY     right    Social History Social History  Substance Use Topics  . Smoking status: Current Every Day Smoker    Packs/day: 1.00    Years: 30.00    Types: Cigarettes  . Smokeless tobacco: Never Used  . Alcohol use 0.0 oz/week      Family History Family History  Problem Relation Age of Onset  . Cancer Mother   . COPD Father 102    black lung     Allergies  Allergen Reactions  . Lidocaine Anaphylaxis  . Penicillins Rash and Other (See Comments)    Has patient had a PCN reaction causing immediate rash, facial/tongue/throat swelling, SOB or  lightheadedness with hypotension: No Has patient had a PCN reaction causing severe rash involving mucus membranes or skin necrosis: No Has patient had a PCN reaction that required hospitalization No Has patient had a PCN reaction occurring within the last 10 years: No If all of the above answers are "NO", then may proceed with Cephalosporin use.      REVIEW OF SYSTEMS (Negative unless checked)  Constitutional: '[]'$ Weight loss  '[]'$ Fever  '[]'$ Chills Cardiac: '[]'$ Chest pain   '[]'$ Chest pressure   '[]'$ Palpitations   '[]'$ Shortness of breath when laying flat   '[]'$ Shortness of breath at rest   '[]'$ Shortness of breath with exertion. Vascular:  '[]'$ Pain in legs with walking   '[]'$ Pain in legs at rest   '[]'$ History of DVT   '[]'$ Phlebitis   '[]'$ Swelling in legs   '[]'$ Varicose veins Pulmonary:   '[]'$ Uses home oxygen   '[]'$ Productive cough   '[]'$ Hemoptysis   '[]'$ Wheeze  '[]'$ COPD   '[]'$ Asthma Neurologic:  '[]'$ Dizziness  '[]'$ Blackouts   '[]'$ Seizures   '[]'$ History of stroke   '[]'$ History of TIA  '[]'$ Aphasia   '[]'$ Temporary blindness   '[]'$ Dysphagia   '[]'$ Weakness or numbness in arms   '[]'$ Weakness or numbness in legs Musculoskeletal:  '[]'$ Arthritis   '[]'$ Joint swelling   '[]'$ Joint pain   '[]'$ Low back pain Hematologic:  '[]'$ Easy bruising  '[]'$ Easy bleeding   '[]'$ Hypercoagulable state   '[]'$ Anemic   Gastrointestinal:   '[]'$ Vomiting  '[]'$ Gastroesophageal reflux/heartburn   '[]'$ Abdominal pain Genitourinary:  '[]'$ Chronic kidney disease   '[]'$ Difficult urination  '[]'$ Frequent urination  '[]'$ Burning with urination   '[]'$ Hematuria Skin:  '[]'$ Rashes   '[]'$ Ulcers   '[]'$ Wounds Psychological:  '[]'$  anxiety   '[]'$  depression.  Physical Examination  BP (!) 160/93   Pulse (!) 54   Resp 16   Ht '5\' 8"'$  (1.727 m)   Wt 132 lb (59.9 kg)   BMI 20.07 kg/m  Gen:  WD/WN, NAD Head: [place]/AT, No temporalis wasting. Ear/Nose/Throat: Hearing grossly intact, nares w/o erythema or drainage, trachea midline Eyes: PERRLA.  Sclera non-icteric Neck: Supple.  No JVD. No carotid bruits present Pulmonary:  Good air movement, no use of  accessory muscles. Clear bilaterally Cardiac: RRR, normal S1, S2 Vascular: abdominal aorta is easily palpable Vessel Right Left  Radial Palpable Palpable  Ulnar Palpable Palpable  Brachial Palpable Palpable  Carotid Palpable, without bruit Palpable, without bruit  Femoral Palpable Palpable  Popliteal Palpable Palpable  PT Palpable Palpable  DP Palpable Palpable   Gastrointestinal: Non-tender/non-distended. No guarding.  Musculoskeletal: M/S 5/5 throughout.  No deformity or atrophy.  Neurologic: CN 2-12 intact. Pain and light touch intact in extremities.  Symmetrical.  Speech is fluent.  Psychiatric: Judgment intact, Mood & affect appropriate for  pt's clinical situation. Dermatologic: No rashes or ulcers noted.  No cellulitis or open wounds.    Labs Recent Results (from the past 2160 hour(s))  PSA     Status: None   Collection Time: 11/23/15  9:33 AM  Result Value Ref Range   Prostate Specific Ag, Serum 0.5 0.0 - 4.0 ng/mL    Comment: Roche ECLIA methodology. According to the American Urological Association, Serum PSA should decrease and remain at undetectable levels after radical prostatectomy. The AUA defines biochemical recurrence as an initial PSA value 0.2 ng/mL or greater followed by a subsequent confirmatory PSA value 0.2 ng/mL or greater. Values obtained with different assay methods or kits cannot be used interchangeably. Results cannot be interpreted as absolute evidence of the presence or absence of malignant disease.   Bladder Scan (Post Void Residual) in office     Status: None   Collection Time: 11/23/15  9:49 AM  Result Value Ref Range   Scan Result 59     Radiology Ct Angio Abdomen W &/or Wo Contrast  Result Date: 12/01/2015 CLINICAL DATA:  No hx surg to a/p. No ca hx. F/U AAA. No new complaints. EXAM: CT ANGIOGRAPHY ABDOMEN TECHNIQUE: Volumetric CT imaging of the abdomen was performed during and following the dynamic injection of intravenous contrast.  Arterial and delayed images were acquired as were computer-generated coronal MIP and MPR sagittal and coronal reformatted images. CONTRAST:  75 mL of Isovue 370 intravenous contrast COMPARISON:  11/19/2014 FINDINGS: VASCULAR Aorta: There is a fusiform aneurysm of the infrarenal abdominal aorta measured 4.7 cm in length. Aneurysm measures 3.8 x 3.6 cm in greatest transverse dimensions on the axial images, and measures 3.6 cm from anterior-posterior on the sagittal reconstructed image. It previously measured 3.1 cm in the same location on the sagittal reconstructed image. There is significant mural thrombus along the aneurysm mostly along its left aspect. Atherosclerotic plaque is noted along entire abdominal aorta. Atherosclerotic plaque extends into the common iliac arteries. Celiac: There is severe narrowing just beyond the origin of the celiac axis, greater than 90%, increased significantly in severity when compared the prior CT. Beyond this narrowing, enhancement is seen within the branches of the celiac axis. SMA: Widely patent. Renals: Focal narrowing of the right renal artery, with the estimated narrowing of 70% to 80%. Left renal artery with mild plaque at its origin without significant stenosis. Narrowing of the right renal artery has increased from the prior CT. IMA: The IMA is draped over the anterior aspect of the aneurysm. It remains patent. Inflow: There is irregular atherosclerotic narrowing of the common iliac arteries without a hemodynamically significant stenosis on the included field of view. Veins: Unremarkable. Review of the MIP images confirms the above findings. NON-VASCULAR Lower chest: Lung bases show advanced changes of centrilobular emphysema. No pneumonia or edema. Heart normal in size. Hepatobiliary: Vague area of hypoattenuation noted along the medial margin of the right liver lobe, not evident on the prior CT. This measures approximately 8 mm in size. It is more conspicuous on the  delayed, venous images than on the arterial images. There is another small focal area of hypoattenuation in the right lobe measuring 4 mm, also not seen on the prior study. This may reflect a cyst. Tiny low-density lesion is noted at the dome of the medial segment of the left lobe which may reflect a cyst. No other liver abnormalities. Gallbladder is unremarkable. No bile duct dilation Pancreas: No mass or inflammation. Spleen: Normal Adrenals/Urinary Tract: Normal adrenal  glands. Mild decrease in the right renal size with right renal cortical thinning. Relative decreased enhancement of the right kidney compared to the left. Renal cortical thinning and the enhancement differential has increased when compared the prior CT. No renal masses and no stones. No hydronephrosis. Stomach/Bowel: Stomach and visualized small bowel and colon are unremarkable. Lymphatic: No adenopathy. Musculoskeletal: No osteoblastic or osteolytic lesions. Disc degenerative changes at L5-S1. Other: No ascites. IMPRESSION: VASCULAR 1. The abdominal aortic aneurysm has increased in size. Measured on the sagittal reconstructed sequence, it currently measures 3.6 cm in diameter, previously 3.1 cm. This is borderline for rapid growth, which is defined as greater than 5 mm growth over the past 12 months is associated with an increased risk of aneurysm rupture. Recommend vascular surgery referral/consultation if not already obtained. 2. Aortic atherosclerosis and mural thrombus. No thrombus has increased. Atherosclerotic changes, particularly and branch vessels, has increased. 3. There is a severe stenosis of the celiac axis increased from prior study. There is also a severe stenosis of the right renal artery increased from the prior study. NON-VASCULAR 1. No acute findings. 2. Right renal cortical thinning has increased when compared the prior study with a decrease in the right renal size. There is less enhancement of the right renal artery than the  left. This is presumably due to the severe stenosis of the right renal artery. Electronically Signed   By: Lajean Manes M.D.   On: 12/01/2015 12:34    Assessment/Plan  1. AAA (abdominal aortic aneurysm) without rupture (441.4  I71.4) No surgery or intervention at this time. The patient has an asymptomatic abdominal aortic aneurysm that is less than 4 cm in maximal diameter. I have discussed the natural history of abdominal aortic aneurysm and the small risk of rupture for aneurysm less than 5 cm in size. However, as these small aneurysms tend to enlarge over time, continued surveillance with ultrasound or CT scan is mandatory. I have also discussed optimizing medical management with hypertension and lipid control and the importance of abstinence from tobacco. The patient is also encouraged to exercise a minimum of 30 minutes 4 times a week. Should the patient develop new onset abdominal or back pain or signs of peripheral embolization they are instructed to seek medical attention immediately and to alert the physician providing care that they have an aneurysm.  The patient will return in 12 months with an aortic duplex.  2.  Renal artery Stenosis At the present time patient's blood pressure appears to be reasonable and therefore do not recommend intervention of the right renal artery. However, I did spend significant amount of time rocks mainly 15-20 minutes in discussion with the patient and his wife regarding renal artery stenosis and stenting. This was in addition to the discussion of his AAA. He is also in addition to the discussion of syncope.  Patient will follow up with me in 6 months with a renal artery duplex and reassessment of his blood pressure.  3.  Syncope and collapse Patient's carotid ultrasound does not demonstrate disease consistent with vascular etiology. He should be evaluated by ENT and continue his workup with Dr. Ubaldo Glassing.  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  5.  COPD (chronic obstructive pulmonary disease) (496  J44.9)  6.  Hyperlipidemia (272.4  E78.5)  7.  Hypertension (401.9  I10)  8.  OA (osteoarthritis) (  715.90  M19.90)  9.  Tobacco dependence syndrome (305.1  F17.200) Smoking cessation was again discussed.   Hortencia Pilar, MD  12/05/2015 9:56 AM    This note was created with Dragon medical transcription system.  Any errors from dictation are purely unintentional

## 2016-03-27 ENCOUNTER — Other Ambulatory Visit: Payer: Self-pay | Admitting: Orthopedic Surgery

## 2016-03-27 DIAGNOSIS — M25312 Other instability, left shoulder: Secondary | ICD-10-CM

## 2016-03-27 DIAGNOSIS — S46002A Unspecified injury of muscle(s) and tendon(s) of the rotator cuff of left shoulder, initial encounter: Secondary | ICD-10-CM

## 2016-03-27 DIAGNOSIS — M25512 Pain in left shoulder: Secondary | ICD-10-CM

## 2016-04-12 ENCOUNTER — Ambulatory Visit
Admission: RE | Admit: 2016-04-12 | Discharge: 2016-04-12 | Disposition: A | Discharge status: Home / Self Care | Payer: Medicare Other | Source: Ambulatory Visit | Attending: Orthopedic Surgery | Admitting: Orthopedic Surgery

## 2016-04-12 DIAGNOSIS — X58XXXA Exposure to other specified factors, initial encounter: Secondary | ICD-10-CM | POA: Diagnosis not present

## 2016-04-12 DIAGNOSIS — M25312 Other instability, left shoulder: Secondary | ICD-10-CM | POA: Diagnosis not present

## 2016-04-12 DIAGNOSIS — M19012 Primary osteoarthritis, left shoulder: Secondary | ICD-10-CM | POA: Insufficient documentation

## 2016-04-12 DIAGNOSIS — M25512 Pain in left shoulder: Secondary | ICD-10-CM

## 2016-04-12 DIAGNOSIS — S43422A Sprain of left rotator cuff capsule, initial encounter: Secondary | ICD-10-CM | POA: Diagnosis not present

## 2016-04-12 DIAGNOSIS — S46012A Strain of muscle(s) and tendon(s) of the rotator cuff of left shoulder, initial encounter: Secondary | ICD-10-CM | POA: Insufficient documentation

## 2016-04-12 DIAGNOSIS — S46002A Unspecified injury of muscle(s) and tendon(s) of the rotator cuff of left shoulder, initial encounter: Secondary | ICD-10-CM

## 2016-05-03 ENCOUNTER — Encounter
Admission: RE | Admit: 2016-05-03 | Discharge: 2016-05-03 | Disposition: A | Discharge status: Home / Self Care | Payer: Medicare Other | Source: Ambulatory Visit | Attending: Surgery | Admitting: Surgery

## 2016-05-03 DIAGNOSIS — M75122 Complete rotator cuff tear or rupture of left shoulder, not specified as traumatic: Secondary | ICD-10-CM | POA: Insufficient documentation

## 2016-05-03 DIAGNOSIS — I2510 Atherosclerotic heart disease of native coronary artery without angina pectoris: Secondary | ICD-10-CM | POA: Insufficient documentation

## 2016-05-03 DIAGNOSIS — Z01818 Encounter for other preprocedural examination: Secondary | ICD-10-CM | POA: Diagnosis present

## 2016-05-03 DIAGNOSIS — R001 Bradycardia, unspecified: Secondary | ICD-10-CM | POA: Insufficient documentation

## 2016-05-03 HISTORY — DX: Spinal stenosis, lumbar region without neurogenic claudication: M48.061

## 2016-05-03 HISTORY — DX: Abdominal aortic aneurysm, without rupture: I71.4

## 2016-05-03 HISTORY — DX: Irritable bowel syndrome, unspecified: K58.9

## 2016-05-03 HISTORY — DX: Anxiety disorder, unspecified: F41.9

## 2016-05-03 HISTORY — DX: Abdominal aortic aneurysm, without rupture, unspecified: I71.40

## 2016-05-03 HISTORY — DX: Acute myocardial infarction, unspecified: I21.9

## 2016-05-03 HISTORY — DX: Sciatica, unspecified side: M54.30

## 2016-05-03 HISTORY — DX: Restless legs syndrome: G25.81

## 2016-05-03 HISTORY — DX: Atherosclerotic heart disease of native coronary artery without angina pectoris: I25.10

## 2016-05-03 HISTORY — DX: Unspecified osteoarthritis, unspecified site: M19.90

## 2016-05-03 HISTORY — DX: Chronic pain syndrome: G89.4

## 2016-05-03 LAB — DIFFERENTIAL
BASOS ABS: 0 10*3/uL (ref 0–0.1)
BASOS PCT: 0 %
EOS ABS: 0.2 10*3/uL (ref 0–0.7)
Eosinophils Relative: 3 %
Lymphocytes Relative: 26 %
Lymphs Abs: 2 10*3/uL (ref 1.0–3.6)
MONO ABS: 0.8 10*3/uL (ref 0.2–1.0)
MONOS PCT: 10 %
NEUTROS ABS: 4.6 10*3/uL (ref 1.4–6.5)
Neutrophils Relative %: 61 %

## 2016-05-03 LAB — BASIC METABOLIC PANEL
ANION GAP: 7 (ref 5–15)
BUN: 14 mg/dL (ref 6–20)
CALCIUM: 9.1 mg/dL (ref 8.9–10.3)
CO2: 27 mmol/L (ref 22–32)
Chloride: 105 mmol/L (ref 101–111)
Creatinine, Ser: 0.99 mg/dL (ref 0.61–1.24)
GFR calc Af Amer: >60 mL/min (ref >60)
GLUCOSE: 86 mg/dL (ref 65–99)
POTASSIUM: 4.3 mmol/L (ref 3.5–5.1)
SODIUM: 139 mmol/L (ref 135–145)

## 2016-05-03 LAB — CBC
HEMATOCRIT: 44 % (ref 40.0–52.0)
Hemoglobin: 15 g/dL (ref 13.0–18.0)
MCH: 33.4 pg (ref 26.0–34.0)
MCHC: 34.2 g/dL (ref 32.0–36.0)
MCV: 97.8 fL (ref 80.0–100.0)
PLATELETS: 196 10*3/uL (ref 150–440)
RBC: 4.5 MIL/uL (ref 4.40–5.90)
RDW: 13.9 % (ref 11.5–14.5)
WBC: 7.6 10*3/uL (ref 3.8–10.6)

## 2016-05-03 NOTE — Patient Instructions (Signed)
Your procedure is scheduled on: May 10, 2016 (THURSDAY) Report to Same Day Surgery 2nd floor medical mall ([place] Entrance-take elevator on left to 2nd floor.  Check in with surgery information desk.) To find out your arrival time please call [phone] between 1PM - 3PM on  May 09, 2016 [facility])  Remember: Instructions that are not followed completely may result in serious medical risk, up to and including death, or upon the discretion of your surgeon and anesthesiologist your surgery may need to be rescheduled.    _x___ 1. Do not eat food or drink liquids after midnight. No gum chewing or                              hard candies.     __x__ 2. No Alcohol for 24 hours before or after surgery.   __x__3. No Smoking for 24 prior to surgery.   ____  4. Bring all medications with you on the day of surgery if instructed.    __x__ 5. Notify your doctor if there is any change in your medical condition     (cold, fever, infections).     Do not wear jewelry, make-up, hairpins, clips or nail polish.  Do not wear lotions, powders, or perfumes. You may wear deodorant.  Do not shave 48 hours prior to surgery. Men may shave face and neck.  Do not bring valuables to the hospital.    [facility] is not responsible for any belongings or valuables.               Contacts, dentures or bridgework may not be worn into surgery.  Leave your suitcase in the car. After surgery it may be brought to your room.  For patients admitted to the hospital, discharge time is determined by your                       treatment team.   Patients discharged the day of surgery will not be allowed to drive home.  You will need someone to drive you home and stay with you the night of your procedure.    Please read over the following fact sheets that you were given:   [facility] Preparing for [facility] and or MRSA Information   _x___ Take anti-hypertensive (unless it includes a diuretic), cardiac, seizure,  asthma,     anti-reflux and psychiatric medicines. These include:  1. LOSARTAN  2.PROZAC  3.  4.  5.  6.  ____Fleets enema or Magnesium Citrate as directed.   _x___ Use CHG Soap or sage wipes as directed on instruction sheet   _x___ Use inhalers on the day of surgery and bring to hospital day of surgery (USE ALBUTEROL , SYMBICORT, AND SPIRIVA INHALERS THE MORNING OF SURGERY AND BRING ALBUTEROL AND SYMBICORT INHALERS WITH YOU TO HOSPITAL)  ____ Stop Metformin and Janumet 2 days prior to surgery.    ____ Take 1/2 of usual insulin dose the night before surgery and none on the morning     surgery.   _x___ Follow recommendations from Cardiologist, Pulmonologist or PCP regarding          stopping Aspirin, Coumadin, Pllavix ,Eliquis, Effient, or Pradaxa, and Pletal.  x____Stop Anti-inflammatories such as Advil, Aleve, Ibuprofen, Motrin, Naproxen, Naprosyn, Goodies powders or aspirin products. OK to take Tylenol    _x___ Stop supplements until after surgery.  But may continue Vitamin D, Vitamin B,  and multivitamin.   ____ Bring C-Pap to the hospital.

## 2016-05-10 ENCOUNTER — Encounter: Payer: Self-pay | Admitting: *Deleted

## 2016-05-10 ENCOUNTER — Ambulatory Visit
Admission: RE | Admit: 2016-05-10 | Discharge: 2016-05-10 | Disposition: A | Discharge status: Home / Self Care | Payer: Medicare Other | Source: Ambulatory Visit | Attending: Surgery | Admitting: Surgery

## 2016-05-10 ENCOUNTER — Ambulatory Visit: Payer: Medicare Other | Admitting: Anesthesiology

## 2016-05-10 ENCOUNTER — Encounter
Admission: RE | Disposition: A | Discharge status: Home / Self Care | Payer: Self-pay | Source: Ambulatory Visit | Attending: Surgery

## 2016-05-10 DIAGNOSIS — F17210 Nicotine dependence, cigarettes, uncomplicated: Secondary | ICD-10-CM | POA: Diagnosis not present

## 2016-05-10 DIAGNOSIS — Z7951 Long term (current) use of inhaled steroids: Secondary | ICD-10-CM | POA: Insufficient documentation

## 2016-05-10 DIAGNOSIS — Z7982 Long term (current) use of aspirin: Secondary | ICD-10-CM | POA: Diagnosis not present

## 2016-05-10 DIAGNOSIS — J449 Chronic obstructive pulmonary disease, unspecified: Secondary | ICD-10-CM | POA: Insufficient documentation

## 2016-05-10 DIAGNOSIS — M75112 Incomplete rotator cuff tear or rupture of left shoulder, not specified as traumatic: Secondary | ICD-10-CM | POA: Diagnosis present

## 2016-05-10 DIAGNOSIS — E1151 Type 2 diabetes mellitus with diabetic peripheral angiopathy without gangrene: Secondary | ICD-10-CM | POA: Diagnosis not present

## 2016-05-10 DIAGNOSIS — I10 Essential (primary) hypertension: Secondary | ICD-10-CM | POA: Diagnosis not present

## 2016-05-10 DIAGNOSIS — Z880 Allergy status to penicillin: Secondary | ICD-10-CM | POA: Diagnosis not present

## 2016-05-10 DIAGNOSIS — Z955 Presence of coronary angioplasty implant and graft: Secondary | ICD-10-CM | POA: Diagnosis not present

## 2016-05-10 DIAGNOSIS — I252 Old myocardial infarction: Secondary | ICD-10-CM | POA: Insufficient documentation

## 2016-05-10 DIAGNOSIS — W000XXA Fall on same level due to ice and snow, initial encounter: Secondary | ICD-10-CM | POA: Diagnosis not present

## 2016-05-10 DIAGNOSIS — S46012A Strain of muscle(s) and tendon(s) of the rotator cuff of left shoulder, initial encounter: Secondary | ICD-10-CM | POA: Diagnosis not present

## 2016-05-10 DIAGNOSIS — M1990 Unspecified osteoarthritis, unspecified site: Secondary | ICD-10-CM | POA: Diagnosis not present

## 2016-05-10 DIAGNOSIS — Z888 Allergy status to other drugs, medicaments and biological substances status: Secondary | ICD-10-CM | POA: Insufficient documentation

## 2016-05-10 DIAGNOSIS — I2510 Atherosclerotic heart disease of native coronary artery without angina pectoris: Secondary | ICD-10-CM | POA: Insufficient documentation

## 2016-05-10 DIAGNOSIS — F418 Other specified anxiety disorders: Secondary | ICD-10-CM | POA: Diagnosis not present

## 2016-05-10 DIAGNOSIS — F329 Major depressive disorder, single episode, unspecified: Secondary | ICD-10-CM | POA: Diagnosis not present

## 2016-05-10 DIAGNOSIS — F419 Anxiety disorder, unspecified: Secondary | ICD-10-CM | POA: Diagnosis not present

## 2016-05-10 DIAGNOSIS — Z79899 Other long term (current) drug therapy: Secondary | ICD-10-CM | POA: Diagnosis not present

## 2016-05-10 HISTORY — DX: Abdominal aortic aneurysm, without rupture: I71.4

## 2016-05-10 HISTORY — DX: Cerebral infarction, unspecified: I63.9

## 2016-05-10 HISTORY — DX: Cranial nerve disorder, unspecified: G52.9

## 2016-05-10 HISTORY — PX: SHOULDER ARTHROSCOPY WITH SUBACROMIAL DECOMPRESSION, ROTATOR CUFF REPAIR AND BICEP TENDON REPAIR: SHX5687

## 2016-05-10 SURGERY — SHOULDER ARTHROSCOPY WITH SUBACROMIAL DECOMPRESSION, ROTATOR CUFF REPAIR AND BICEP TENDON REPAIR
Anesthesia: General | Site: Shoulder | Laterality: Left | Wound class: Clean

## 2016-05-10 MED ORDER — BUPIVACAINE-EPINEPHRINE 0.5% -1:200000 IJ SOLN
INTRAMUSCULAR | Status: DC | PRN
  Administered 2016-05-10: 30 mL

## 2016-05-10 MED ORDER — ONDANSETRON HCL 4 MG/2ML IJ SOLN
INTRAMUSCULAR | Status: AC
  Filled 2016-05-10: qty 2

## 2016-05-10 MED ORDER — PROPOFOL 10 MG/ML IV BOLUS
INTRAVENOUS | Status: DC | PRN
  Administered 2016-05-10: 50 mg via INTRAVENOUS
  Administered 2016-05-10: 150 mg via INTRAVENOUS

## 2016-05-10 MED ORDER — LIDOCAINE HCL (PF) 1 % IJ SOLN
INTRAMUSCULAR | Status: DC | PRN
  Administered 2016-05-10: 3 mL

## 2016-05-10 MED ORDER — CLINDAMYCIN PHOSPHATE 900 MG/50ML IV SOLN
900.0000 mg | Freq: Once | INTRAVENOUS | Status: AC
Start: 2016-05-10 — End: 2016-05-10
  Administered 2016-05-10: 900 mg via INTRAVENOUS

## 2016-05-10 MED ORDER — FENTANYL CITRATE (PF) 100 MCG/2ML IJ SOLN
INTRAMUSCULAR | Status: AC
  Filled 2016-05-10: qty 2

## 2016-05-10 MED ORDER — POTASSIUM CHLORIDE IN NACL 20-0.9 MEQ/L-% IV SOLN: INTRAVENOUS | Status: DC

## 2016-05-10 MED ORDER — PROPOFOL 10 MG/ML IV BOLUS
INTRAVENOUS | Status: AC
  Filled 2016-05-10: qty 20

## 2016-05-10 MED ORDER — ROCURONIUM BROMIDE 50 MG/5ML IV SOLN
INTRAVENOUS | Status: AC
  Filled 2016-05-10: qty 1

## 2016-05-10 MED ORDER — METOCLOPRAMIDE HCL 5 MG/ML IJ SOLN: 5.0000 mg | Freq: Three times a day (TID) | INTRAMUSCULAR | Status: DC | PRN

## 2016-05-10 MED ORDER — ONDANSETRON HCL 4 MG PO TABS: 4.0000 mg | ORAL_TABLET | Freq: Four times a day (QID) | ORAL | Status: DC | PRN

## 2016-05-10 MED ORDER — PHENYLEPHRINE HCL 10 MG/ML IJ SOLN
INTRAMUSCULAR | Status: DC | PRN
  Administered 2016-05-10: 100 ug via INTRAVENOUS

## 2016-05-10 MED ORDER — DEXAMETHASONE SODIUM PHOSPHATE 10 MG/ML IJ SOLN
INTRAMUSCULAR | Status: AC
  Filled 2016-05-10: qty 1

## 2016-05-10 MED ORDER — SUGAMMADEX SODIUM 200 MG/2ML IV SOLN
INTRAVENOUS | Status: AC
  Filled 2016-05-10: qty 2

## 2016-05-10 MED ORDER — ROPIVACAINE HCL 5 MG/ML IJ SOLN
INTRAMUSCULAR | Status: DC | PRN
  Administered 2016-05-10: 30 mL via PERINEURAL

## 2016-05-10 MED ORDER — DEXAMETHASONE SODIUM PHOSPHATE 10 MG/ML IJ SOLN
INTRAMUSCULAR | Status: DC | PRN
  Administered 2016-05-10: 5 mg via INTRAVENOUS

## 2016-05-10 MED ORDER — OXYCODONE HCL 5 MG PO TABS: 5.0000 mg | ORAL_TABLET | Freq: Once | ORAL | Status: DC | PRN

## 2016-05-10 MED ORDER — EPHEDRINE SULFATE 50 MG/ML IJ SOLN
INTRAMUSCULAR | Status: DC | PRN
  Administered 2016-05-10 (×2): 5 mg via INTRAVENOUS
  Administered 2016-05-10 (×2): 10 mg via INTRAVENOUS

## 2016-05-10 MED ORDER — ACETAMINOPHEN 10 MG/ML IV SOLN
INTRAVENOUS | Status: AC
  Filled 2016-05-10: qty 100

## 2016-05-10 MED ORDER — MIDAZOLAM HCL 2 MG/2ML IJ SOLN
INTRAMUSCULAR | Status: AC
  Administered 2016-05-10: 2 mg via INTRAVENOUS
  Filled 2016-05-10: qty 2

## 2016-05-10 MED ORDER — LACTATED RINGERS IV SOLN
INTRAVENOUS | Status: DC
  Administered 2016-05-10: 12:00:00 via INTRAVENOUS
  Administered 2016-05-10: 25 mL/h via INTRAVENOUS

## 2016-05-10 MED ORDER — EPINEPHRINE PF 1 MG/ML IJ SOLN
INTRAMUSCULAR | Status: DC | PRN
  Administered 2016-05-10: 2 mL

## 2016-05-10 MED ORDER — FENTANYL CITRATE (PF) 100 MCG/2ML IJ SOLN
INTRAMUSCULAR | Status: DC | PRN
  Administered 2016-05-10 (×2): 50 ug via INTRAVENOUS

## 2016-05-10 MED ORDER — FAMOTIDINE 20 MG PO TABS
20.0000 mg | ORAL_TABLET | Freq: Once | ORAL | Status: AC
  Administered 2016-05-10: 20 mg via ORAL

## 2016-05-10 MED ORDER — OXYCODONE HCL 5 MG PO TABS: 5.0000 mg | ORAL_TABLET | ORAL | Status: DC | PRN

## 2016-05-10 MED ORDER — METOCLOPRAMIDE HCL 10 MG PO TABS: 5.0000 mg | ORAL_TABLET | Freq: Three times a day (TID) | ORAL | Status: DC | PRN

## 2016-05-10 MED ORDER — ACETAMINOPHEN 10 MG/ML IV SOLN
INTRAVENOUS | Status: DC | PRN
  Administered 2016-05-10: 1000 mg via INTRAVENOUS

## 2016-05-10 MED ORDER — ROCURONIUM BROMIDE 100 MG/10ML IV SOLN
INTRAVENOUS | Status: DC | PRN
  Administered 2016-05-10: 20 mg via INTRAVENOUS
  Administered 2016-05-10: 50 mg via INTRAVENOUS

## 2016-05-10 MED ORDER — ONDANSETRON HCL 4 MG/2ML IJ SOLN
INTRAMUSCULAR | Status: DC | PRN
  Administered 2016-05-10: 4 mg via INTRAVENOUS

## 2016-05-10 MED ORDER — MIDAZOLAM HCL 2 MG/2ML IJ SOLN
2.0000 mg | Freq: Once | INTRAMUSCULAR | Status: AC
  Administered 2016-05-10: 2 mg via INTRAVENOUS

## 2016-05-10 MED ORDER — ONDANSETRON HCL 4 MG/2ML IJ SOLN: 4.0000 mg | Freq: Four times a day (QID) | INTRAMUSCULAR | Status: DC | PRN

## 2016-05-10 MED ORDER — FENTANYL CITRATE (PF) 100 MCG/2ML IJ SOLN: 25.0000 ug | INTRAMUSCULAR | Status: DC | PRN

## 2016-05-10 MED ORDER — ROPIVACAINE HCL 5 MG/ML IJ SOLN
INTRAMUSCULAR | Status: AC
  Filled 2016-05-10: qty 20

## 2016-05-10 MED ORDER — PROMETHAZINE HCL 25 MG/ML IJ SOLN: 6.2500 mg | INTRAMUSCULAR | Status: DC | PRN

## 2016-05-10 MED ORDER — MEPERIDINE HCL 50 MG/ML IJ SOLN: 6.2500 mg | INTRAMUSCULAR | Status: DC | PRN

## 2016-05-10 MED ORDER — BUPIVACAINE-EPINEPHRINE (PF) 0.5% -1:200000 IJ SOLN
INTRAMUSCULAR | Status: AC
  Filled 2016-05-10: qty 30

## 2016-05-10 MED ORDER — CLINDAMYCIN PHOSPHATE 900 MG/50ML IV SOLN
INTRAVENOUS | Status: AC
  Filled 2016-05-10: qty 50

## 2016-05-10 MED ORDER — SUGAMMADEX SODIUM 200 MG/2ML IV SOLN
INTRAVENOUS | Status: DC | PRN
  Administered 2016-05-10: 200 mg via INTRAVENOUS

## 2016-05-10 MED ORDER — EPINEPHRINE PF 1 MG/ML IJ SOLN
INTRAMUSCULAR | Status: AC
  Filled 2016-05-10: qty 2

## 2016-05-10 MED ORDER — FAMOTIDINE 20 MG PO TABS
ORAL_TABLET | ORAL | Status: AC
  Administered 2016-05-10: 20 mg via ORAL
  Filled 2016-05-10: qty 1

## 2016-05-10 MED ORDER — LIDOCAINE HCL (PF) 1 % IJ SOLN
INTRAMUSCULAR | Status: AC
  Filled 2016-05-10: qty 5

## 2016-05-10 MED ORDER — OXYCODONE HCL 5 MG/5ML PO SOLN: 5.0000 mg | Freq: Once | ORAL | Status: DC | PRN

## 2016-05-10 SURGICAL SUPPLY — 46 items
ANCHOR JUGGERKNOT WTAP NDL 2.9 (Anchor) ×18 IMPLANT
ANCHOR SUT QUATTRO KNTLS 4.5 (Anchor) ×9 IMPLANT
BIT DRILL JUGRKNT W/NDL BIT2.9 (DRILL) ×1 IMPLANT
BLADE FULL RADIUS 3.5 (BLADE) ×3 IMPLANT
BUR ACROMIONIZER 4.0 (BURR) ×3 IMPLANT
CANNULA SHAVER 8MMX76MM (CANNULA) ×3 IMPLANT
CHLORAPREP W/TINT 26ML (MISCELLANEOUS) ×3 IMPLANT
COVER MAYO STAND STRL (DRAPES) ×3 IMPLANT
DRAPE IMP U-DRAPE 54X76 (DRAPES) ×9 IMPLANT
DRILL JUGGERKNOT W/NDL BIT 2.9 (DRILL) ×3
DRSG OPSITE POSTOP 4X8 (GAUZE/BANDAGES/DRESSINGS) ×3 IMPLANT
ELECT REM PT RETURN 9FT ADLT (ELECTROSURGICAL) ×3
ELECTRODE REM PT RTRN 9FT ADLT (ELECTROSURGICAL) ×1 IMPLANT
GAUZE PETRO XEROFOAM 1X8 (MISCELLANEOUS) ×3 IMPLANT
GAUZE SPONGE 4X4 12PLY STRL (GAUZE/BANDAGES/DRESSINGS) ×3 IMPLANT
GLOVE BIO SURGEON STRL SZ7.5 (GLOVE) ×6 IMPLANT
GLOVE BIO SURGEON STRL SZ8 (GLOVE) ×6 IMPLANT
GLOVE BIOGEL PI IND STRL 8 (GLOVE) ×1 IMPLANT
GLOVE BIOGEL PI INDICATOR 8 (GLOVE) ×2
GLOVE INDICATOR 8.0 STRL GRN (GLOVE) ×3 IMPLANT
GOWN STRL REUS W/ TWL LRG LVL3 (GOWN DISPOSABLE) ×1 IMPLANT
GOWN STRL REUS W/ TWL XL LVL3 (GOWN DISPOSABLE) ×1 IMPLANT
GOWN STRL REUS W/TWL LRG LVL3 (GOWN DISPOSABLE) ×2
GOWN STRL REUS W/TWL XL LVL3 (GOWN DISPOSABLE) ×2
GRASPER SUT 15 45D LOW PRO (SUTURE) IMPLANT
IV LACTATED RINGER IRRG 3000ML (IV SOLUTION) ×4
IV LR IRRIG 3000ML ARTHROMATIC (IV SOLUTION) ×2 IMPLANT
MANIFOLD NEPTUNE II (INSTRUMENTS) ×3 IMPLANT
MASK FACE SPIDER DISP (MASK) ×3 IMPLANT
MAT BLUE FLOOR 46X72 FLO (MISCELLANEOUS) ×3 IMPLANT
NDL MAYO CATGUT SZ5 (NEEDLE)
NDL SUT 5 .5 CRC TPR PNT MAYO (NEEDLE) IMPLANT
NEEDLE REVERSE CUT 1/2 CRC (NEEDLE) IMPLANT
PACK ARTHROSCOPY SHOULDER (MISCELLANEOUS) ×3 IMPLANT
SLING ARM LRG DEEP (SOFTGOODS) IMPLANT
SLING ULTRA II LG (MISCELLANEOUS) ×3 IMPLANT
STAPLER SKIN PROX 35W (STAPLE) ×3 IMPLANT
STRAP SAFETY BODY (MISCELLANEOUS) ×3 IMPLANT
SUT ETHIBOND 0 MO6 C/R (SUTURE) ×3 IMPLANT
SUT VIC AB 2-0 CT1 27 (SUTURE) ×4
SUT VIC AB 2-0 CT1 TAPERPNT 27 (SUTURE) ×2 IMPLANT
TAPE MICROFOAM 4IN (TAPE) ×3 IMPLANT
TUBING ARTHRO INFLOW-ONLY STRL (TUBING) ×3 IMPLANT
TUBING CONNECTING 10 (TUBING) ×2 IMPLANT
TUBING CONNECTING 10' (TUBING) ×1
WAND HAND CNTRL MULTIVAC 90 (MISCELLANEOUS) ×3 IMPLANT

## 2016-05-10 NOTE — Transfer of Care (Signed)
Immediate Anesthesia Transfer of Care Note  Patient: Joseph Peters  Procedure(s) Performed: Procedure(s) with comments: SHOULDER ARTHROSCOPY WITH DEBTRIDEMENT, DECOMPRESSION, REPAIR OF MASSIVE ROTATOR CUFF TEAR AND BICEP TENODESIS (Left) - Massive cuff tear  Patient Location: PACU  Anesthesia Type:General  Level of Consciousness: sedated  Airway & Oxygen Therapy: Patient connected to face mask oxygen  Post-op Assessment: Post -op Vital signs reviewed and stable  Post vital signs: stable  Last Vitals:  Vitals:   05/10/16 1030 05/10/16 1307  BP: (!) 148/87 135/81  Pulse: 61   Resp: 13 11  Temp:  36.4 C    Last Pain:  Vitals:   05/10/16 1307  TempSrc: Temporal  PainSc:       Patients Stated Pain Goal: 1 (24/82/50 0370)  Complications: No apparent anesthesia complications

## 2016-05-10 NOTE — Discharge Instructions (Addendum)
Keep dressing dry and intact.  May shower after dressing changed on post-op day #4 (Monday).  Cover staples with Band-Aids after drying off. Apply ice frequently to shoulder. Take Naprosyn 500 mg TID with meals for 7-10 days, then as necessary. Take pain meds as prescribed when needed.  May supplement with ES Tylenol if necessary. Keep shoulder immobilizer on at all times except may remove for bathing purposes. Follow-up in 10-14 days or as scheduled.   AMBULATORY SURGERY  DISCHARGE INSTRUCTIONS   1) The drugs that you were given will stay in your system until tomorrow so for the next 24 hours you should not:  A) Drive an automobile B) Make any legal decisions C) Drink any alcoholic beverage   2) You may resume regular meals tomorrow.  Today it is better to start with liquids and gradually work up to solid foods.  You may eat anything you prefer, but it is better to start with liquids, then soup and crackers, and gradually work up to solid foods.   3) Please notify your doctor immediately if you have any unusual bleeding, trouble breathing, redness and pain at the surgery site, drainage, fever, or pain not relieved by medication.   4) Additional Instructions:Sleep in recliner or with shoulders elevated.

## 2016-05-10 NOTE — Anesthesia Post-op Follow-up Note (Cosign Needed)
Anesthesia QCDR form completed.        

## 2016-05-10 NOTE — H&P (Signed)
Paper H&P to be scanned into permanent record. H&P reviewed and patient re-examined. No changes. 

## 2016-05-10 NOTE — Op Note (Signed)
05/10/2016  12:53 PM  Patient:   Joseph Peters  Pre-Op Diagnosis:   Massive rotator cuff tear, left shoulder.  Postoperative diagnosis: Massive rotator cuff tear and biceps tendinopathy, left shoulder.  Procedure: Limited arthroscopic debridement, arthroscopic subacromial decompression, mini-open rotator cuff repair, and mini-open biceps tenodesis, left shoulder.  Anesthesia: General endotracheal with interscalene block placed preoperatively by the anesthesiologist.  Surgeon:   Pascal Lux, MD  Assistant:   Cameron Proud, PA-C; Nuala Alpha, PA-S  Findings: As above. There was a near complete tear of the subscapularis tendon, as well as a complete tear of the supraspinatus tendon with extension into the anterior aspect of the infraspinatus tendon. There were moderate tendinopathic changes of the biceps tendon, as well as mild fraying of the labrum without frank detachment from the glenoid. The articular surfaces of the humerus and glenoid both were in excellent condition.  Complications: None  Fluids:   1200 cc  Estimated blood loss: 20 cc  Tourniquet time: None  Drains: None  Closure: Staples   Brief clinical note: The patient is a 61 year old male who apparently fell from a ladder onto his left side, injuring his left shoulder. The patient was unable to lift his arm following the fall. The patient's history and examination are consistent with impingement/tendinopathy with a large rotator cuff tear. These findings were confirmed by MRI scan. The patient presents at this time for definitive management of these shoulder symptoms.  Procedure: The patient underwent placement of an interscalene block by the anesthesiologist in the preoperative holding area before he was brought into the operating room and lain in the supine position. The patient then underwent general endotracheal intubation and anesthesia before being repositioned in the beach  chair position using the beach chair positioner. The left shoulder and upper extremity were prepped with ChloraPrep solution before being draped sterilely. Preoperative antibiotics were administered. A timeout was performed to confirm the proper surgical site before the expected portal sites and incision site were injected with 0.5% Sensorcaine with epinephrine. A posterior portal was created and the glenohumeral joint thoroughly inspected with the findings as described above. An anterior portal was created using an outside-in technique. The labrum and rotator cuff were further probed, again confirming the above-noted findings. Areas of labral fraying anteriorly were debrided back to stable margins, as were areas of synovitis. The ArthroCare wand was inserted and used to release the biceps from its labral attachment as well as to obtain hemostasis and to "anneal" the labrum superiorly and anteriorly. The instruments were removed from the joint after suctioning the excess fluid.  The camera was repositioned through the posterior portal into the subacromial space. A separate lateral portal was created using an outside-in technique. The 3.5 mm full-radius resector was introduced and used to perform a subtotal bursectomy. The ArthroCare wand was then inserted and used to remove the periosteal tissue off the undersurface of the anterior third of the acromion as well as to recess the coracoacromial ligament from its attachment along the anterior and lateral margins of the acromion. The 4.0 mm acromionizing bur was introduced and used to complete the decompression by removing the undersurface of the anterior third of the acromion. The full radius resector was reintroduced to remove any residual bony debris before the ArthroCare wand was reintroduced to obtain hemostasis. The instruments were then removed from the subacromial space after suctioning the excess fluid.  An approximately 4-5 cm incision was made over the  anterolateral aspect of the shoulder beginning at  the anterolateral corner of the acromion and extending distally in line with the bicipital groove. This incision was carried down through the subcutaneous tissues to expose the deltoid fascia. The raphae between the anterior and middle thirds was identified and this plane developed to provide access into the subacromial space. Additional bursal tissues were debrided sharply using Metzenbaum scissors. The rotator cuff tear was readily identified. Despite several attempts, it was too difficult to notify and release the subscapularis tendon through this incision. Therefore, a separate small deltopectoral incision was made, measuring approximately 7-8 cm. This incision was carried down through the subcutaneous tissues to expose the deltopectoral fascia. The interval between the deltoid and the pectoralis muscles was developed, retracting the cephalic vein laterally with the deltoid muscle. The conjoined tendon was retracted medially to expose the anterior aspect of the shoulder. The subscapularis tendon was identified, secured with several tagging sutures, and mobilized. The exposed portion of the lesser tuberosity was debrided lightly with a rongeur before the subscapularis tendon was repaired using two Biomet 2.9 mm JuggerKnot anchors. These sutures were then brought back laterally and secured using a single [place] QuatroLink anchors to create a two-layer closure.  The bicipital groove was identified by palpation and opened for 1-1.5 cm. The biceps tendon stump was retrieved through this defect. The floor of the bicipital groove was roughened with a curet before another Biomet 2.9 mm JuggerKnot anchor was inserted. Both sets of sutures were passed through the biceps tendon and tied securely to effect the tenodesis. The bicipital sheath was reapproximated using two #0 Ethibond interrupted sutures, incorporating the biceps tendon to further reinforce the  tenodesis.  Attention was redirected back to the more anterolateral incision to address the supraspinatus tendon. Several tagging sutures were placed along the free margin of the tendon to better control the tendon before adhesions were released to better mobilize the tendon. The margins were debrided sharply with a #15 blade and the exposed greater tuberosity roughened with a rongeur. The supraspinatus tendon tear was repaired using three Biomet 2.9 mm JuggerKnot anchors. These sutures were then brought back laterally and secured using two [place] QuatroLink anchors to create a two-layer closure. An apparent watertight closure was obtained.  The wounds were copiously irrigated with sterile saline solution. Medially, the deltopectoral interval was reapproximated using 2-0 Vicryl interrupted sutures for the subcutaneous tissues were closed in 2 layers using 2-0 Vicryl interrupted sutures. The skin was closed using staples. In the more anterolateral incision, the deltoid raphae also was reapproximated using 2-0 Vicryl interrupted sutures. The subcutaneous tissues were closed in two layers using 2-0 Vicryl interrupted sutures before the skin was closed using staples. Finally, the portal sites were closed using staples. A sterile bulky dressing was applied to the shoulder before the arm was placed into a shoulder immobilizer. The patient was then awakened, extubated, and returned to the recovery room in satisfactory condition after tolerating the procedure well.

## 2016-05-10 NOTE — Anesthesia Procedure Notes (Signed)
Anesthesia Regional Block: Interscalene brachial plexus block   Pre-Anesthetic Checklist: ,, timeout performed, Correct Patient, Correct Site, Correct Laterality, Correct Procedure, Correct Position, site marked, Risks and benefits discussed,  Surgical consent,  Pre-op evaluation,  At surgeon's request and post-op pain management  Laterality: Left  Prep: chloraprep       Needles:  Injection technique: Single-shot  Needle Type: Stimiplex     Needle Length: 9cm  Needle Gauge: 22     Additional Needles:   Procedures: ultrasound guided,,,,,,,,  Narrative:  Start time: 05/10/2016 10:20 AM End time: 05/10/2016 10:30 AM Injection made incrementally with aspirations every 5 mL.  Performed by: Personally  Anesthesiologist: Cortlin Marano  Additional Notes: Functioning IV was confirmed and monitors were applied.  A 9m 22ga Stimuplex needle was used. Sterile prep and drape,hand hygiene and sterile gloves were used.  Negative aspiration and negative test dose prior to incremental administration of local anesthetic. The patient tolerated the procedure well.

## 2016-05-10 NOTE — OR Nursing (Signed)
Patient states that he has been unable to void since last night.  Tried while in SDS.  Also, tends to get constipated with pain pills and discussed stool softeners.  Pain script written by pain clinic yesterday.  Dr. Roland Rack aware.  Pt to PACU for block.

## 2016-05-10 NOTE — OR Nursing (Signed)
1520- cap refill WNL, radial pulse strong and regular.

## 2016-05-10 NOTE — Anesthesia Preprocedure Evaluation (Signed)
Anesthesia Evaluation  Patient identified by MRN, date of birth, ID band Patient awake    Reviewed: Allergy & Precautions, NPO status , Patient's Chart, lab work & pertinent test results  History of Anesthesia Complications Negative for: history of anesthetic complications  Airway Mallampati: II  TM Distance: >3 FB Neck ROM: Full    Dental  (+) Missing, Loose, Poor Dentition,    Pulmonary asthma , neg sleep apnea, COPD,  COPD inhaler, Current Smoker,    breath sounds clear to auscultation- rhonchi (-) wheezing      Cardiovascular hypertension, Pt. on medications + CAD, + Past MI and + Peripheral Vascular Disease  (-) Cardiac Stents  Rhythm:Regular Rate:Normal - Systolic murmurs and - Diastolic murmurs    Neuro/Psych PSYCHIATRIC DISORDERS Anxiety Depression    GI/Hepatic negative GI ROS, Neg liver ROS,   Endo/Other  negative endocrine ROSneg diabetes  Renal/GU negative Renal ROS     Musculoskeletal  (+) Arthritis ,   Abdominal (+) - obese,   Peds  Hematology negative hematology ROS (+)   Anesthesia Other Findings Past Medical History: No date: AAA (abdominal aortic aneurysm) without ruptur* No date: Anxiety No date: Arthritis No date: Asthma 07/16/2014: Benign hypertension 07/16/2014: Chest pain on exertion No date: Chronic pain syndrome 02/08/2012: Coccydynia No date: COPD (chronic obstructive pulmonary disease) (*     Comment: patient smokes No date: Coronary artery disease No date: Depression 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, thoracic No date: IBS (irritable bowel syndrome) 2010: Myocardial infarction No date: Restless leg syndrome No date: Sciatica No date: Spinal stenosis of lumbar region 2015: Stroke ([place])     Comment: no residual symptoms   Reproductive/Obstetrics                             Anesthesia Physical Anesthesia Plan  ASA: III  Anesthesia Plan:  General   Post-op Pain Management:  Regional for Post-op pain   Induction: Intravenous  Airway Management Planned: Oral ETT  Additional Equipment:   Intra-op Plan:   Post-operative Plan: Extubation in OR  Informed Consent: I have reviewed the patients History and Physical, chart, labs and discussed the procedure including the risks, benefits and alternatives for the proposed anesthesia with the patient or authorized representative who has indicated his/her understanding and acceptance.   Dental advisory given  Plan Discussed with: CRNA and Anesthesiologist  Anesthesia Plan Comments: (Lidocaine listed as allergy, pt reports that one time after injection at the dentist he required CPR and was taken to the hospital,, however he has had lidocaine at the dentist previous to that episode as well as since that episode without any problems.)        Anesthesia Quick Evaluation

## 2016-05-10 NOTE — OR Nursing (Signed)
Immobilizer to left upper extremity, radial pulse strong and regular, cap refill WNL left fingers.

## 2016-05-10 NOTE — Anesthesia Postprocedure Evaluation (Signed)
Anesthesia Post Note  Patient: Joseph Peters  Procedure(s) Performed: Procedure(s) (LRB): SHOULDER ARTHROSCOPY WITH DEBTRIDEMENT, DECOMPRESSION, REPAIR OF MASSIVE ROTATOR CUFF TEAR AND BICEP TENODESIS (Left)  Patient location during evaluation: PACU Anesthesia Type: General Level of consciousness: awake and alert and oriented Pain management: pain level controlled Vital Signs Assessment: post-procedure vital signs reviewed and stable Respiratory status: spontaneous breathing, nonlabored ventilation and respiratory function stable Cardiovascular status: blood pressure returned to baseline and stable Postop Assessment: no signs of nausea or vomiting Anesthetic complications: no     Last Vitals:  Vitals:   05/10/16 1322 05/10/16 1337  BP: 128/83 118/82  Pulse: 81 76  Resp: 12 12  Temp:      Last Pain:  Vitals:   05/10/16 1337  TempSrc:   PainSc: 0-No pain                 Lyrik Dockstader

## 2016-05-10 NOTE — Anesthesia Procedure Notes (Signed)
Procedure Name: Intubation Date/Time: 05/10/2016 10:47 AM Performed by: Aline Brochure Pre-anesthesia Checklist: Patient identified, Emergency Drugs available, Suction available and Patient being monitored Patient Re-evaluated:Patient Re-evaluated prior to inductionOxygen Delivery Method: Circle system utilized Preoxygenation: Pre-oxygenation with 100% oxygen Intubation Type: IV induction Ventilation: Mask ventilation without difficulty Laryngoscope Size: Mac and 4 Grade View: Grade I Tube type: Oral Tube size: 7.5 mm Number of attempts: 1 Airway Equipment and Method: Stylet Placement Confirmation: ETT inserted through vocal cords under direct vision,  positive ETCO2 and breath sounds checked- equal and bilateral Secured at: 22 cm Tube secured with: Tape Dental Injury: Teeth and Oropharynx as per pre-operative assessment

## 2016-05-11 ENCOUNTER — Encounter: Payer: Self-pay | Admitting: Surgery

## 2016-06-06 ENCOUNTER — Ambulatory Visit (INDEPENDENT_AMBULATORY_CARE_PROVIDER_SITE_OTHER): Payer: Medicare Other | Admitting: Vascular Surgery

## 2016-06-06 ENCOUNTER — Encounter (INDEPENDENT_AMBULATORY_CARE_PROVIDER_SITE_OTHER): Payer: Medicare Other

## 2016-06-15 ENCOUNTER — Emergency Department
Admission: EM | Admit: 2016-06-15 | Discharge: 2016-06-15 | Disposition: A | Discharge status: Home / Self Care | Payer: Medicare Other | Attending: Student in an Organized Health Care Education/Training Program | Admitting: Student in an Organized Health Care Education/Training Program

## 2016-06-15 ENCOUNTER — Emergency Department: Payer: Medicare Other

## 2016-06-15 ENCOUNTER — Encounter: Payer: Self-pay | Admitting: Emergency Medicine

## 2016-06-15 DIAGNOSIS — J449 Chronic obstructive pulmonary disease, unspecified: Secondary | ICD-10-CM | POA: Insufficient documentation

## 2016-06-15 DIAGNOSIS — Z79899 Other long term (current) drug therapy: Secondary | ICD-10-CM | POA: Diagnosis not present

## 2016-06-15 DIAGNOSIS — F10929 Alcohol use, unspecified with intoxication, unspecified: Secondary | ICD-10-CM | POA: Diagnosis not present

## 2016-06-15 DIAGNOSIS — J45909 Unspecified asthma, uncomplicated: Secondary | ICD-10-CM | POA: Insufficient documentation

## 2016-06-15 DIAGNOSIS — E860 Dehydration: Secondary | ICD-10-CM | POA: Diagnosis not present

## 2016-06-15 DIAGNOSIS — R55 Syncope and collapse: Secondary | ICD-10-CM

## 2016-06-15 DIAGNOSIS — I10 Essential (primary) hypertension: Secondary | ICD-10-CM | POA: Diagnosis not present

## 2016-06-15 DIAGNOSIS — F17210 Nicotine dependence, cigarettes, uncomplicated: Secondary | ICD-10-CM | POA: Insufficient documentation

## 2016-06-15 DIAGNOSIS — Z7289 Other problems related to lifestyle: Secondary | ICD-10-CM

## 2016-06-15 DIAGNOSIS — I2510 Atherosclerotic heart disease of native coronary artery without angina pectoris: Secondary | ICD-10-CM | POA: Diagnosis not present

## 2016-06-15 DIAGNOSIS — Z7982 Long term (current) use of aspirin: Secondary | ICD-10-CM | POA: Diagnosis not present

## 2016-06-15 DIAGNOSIS — Z789 Other specified health status: Secondary | ICD-10-CM

## 2016-06-15 LAB — BASIC METABOLIC PANEL
Anion gap: 10 (ref 5–15)
BUN: 10 mg/dL (ref 6–20)
CHLORIDE: 104 mmol/L (ref 101–111)
CO2: 24 mmol/L (ref 22–32)
Calcium: 9 mg/dL (ref 8.9–10.3)
Creatinine, Ser: 1.07 mg/dL (ref 0.61–1.24)
GFR calc Af Amer: >60 mL/min (ref >60)
GFR calc non Af Amer: >60 mL/min (ref >60)
GLUCOSE: 87 mg/dL (ref 65–99)
POTASSIUM: 4 mmol/L (ref 3.5–5.1)
Sodium: 138 mmol/L (ref 135–145)

## 2016-06-15 LAB — CBC WITH DIFFERENTIAL/PLATELET
Basophils Absolute: 0 10*3/uL (ref 0–0.1)
Basophils Relative: 0 %
Eosinophils Absolute: 0.1 10*3/uL (ref 0–0.7)
Eosinophils Relative: 1 %
HEMATOCRIT: 42.3 % (ref 40.0–52.0)
HEMOGLOBIN: 14.5 g/dL (ref 13.0–18.0)
LYMPHS ABS: 1.7 10*3/uL (ref 1.0–3.6)
Lymphocytes Relative: 20 %
MCH: 33.3 pg (ref 26.0–34.0)
MCHC: 34.2 g/dL (ref 32.0–36.0)
MCV: 97.4 fL (ref 80.0–100.0)
MONO ABS: 0.7 10*3/uL (ref 0.2–1.0)
MONOS PCT: 7 %
NEUTROS ABS: 6.3 10*3/uL (ref 1.4–6.5)
NEUTROS PCT: 72 %
Platelets: 178 10*3/uL (ref 150–440)
RBC: 4.35 MIL/uL — ABNORMAL LOW (ref 4.40–5.90)
RDW: 13.3 % (ref 11.5–14.5)
WBC: 8.8 10*3/uL (ref 3.8–10.6)

## 2016-06-15 LAB — ETHANOL: Alcohol, Ethyl (B): 67 mg/dL — ABNORMAL HIGH (ref <5)

## 2016-06-15 LAB — TROPONIN I

## 2016-06-15 MED ORDER — SODIUM CHLORIDE 0.9 % IV BOLUS (SEPSIS)
1000.0000 mL | Freq: Once | INTRAVENOUS | Status: AC
  Administered 2016-06-15: 1000 mL via INTRAVENOUS

## 2016-06-15 NOTE — ED Notes (Signed)
Pt sitting in bed drinking a cup of coffee. Family at bedside.

## 2016-06-15 NOTE — ED Provider Notes (Signed)
[facility] Emergency Department Provider Note    First MD Initiated Contact with Patient 06/15/16 1640     (approximate)  I have reviewed the triage vital signs and the nursing notes.   HISTORY  Chief Complaint Loss of Consciousness    HPI Joseph Peters is a 61 y.o. male presents with fainting spell. Patient states that he was in his yard. Had not had anything to eat or drink today except for 2 beers. Went to go grab something out of a tree felt lightheaded and passed States that he does have a headache but that is unchanged from his chronic daily headaches He denies any chest pain or pressure.No palpitations. Has had multiple fainting spells like this in the past. States they've frequently have happened fter he's been drinking.   Past Medical History:  Diagnosis Date  . AAA (abdominal aortic aneurysm) without rupture ([place])   . Abdominal aneurysm ([place]) 2015   being followed but not large enough to surgically treat  . Anxiety   . Arthritis   . Asthma   . Benign hypertension 07/16/2014  . Chest pain on exertion 07/16/2014  . Chronic pain syndrome   . Coccydynia 02/08/2012  . COPD (chronic obstructive pulmonary disease) ([place])    patient smokes  . Coronary artery disease   . 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 Depression   . 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 IBS (irritable bowel syndrome)   . Myocardial infarction ([place]) 2010  . Restless leg syndrome   . Sciatica   . Spinal stenosis of lumbar region   . Stroke [facility]) 2015   no residual symptoms   Family History  Problem Relation Age of Onset  . Cancer Mother   . COPD Father 46    black lung   Past Surgical History:  Procedure Laterality Date  . CARDIAC CATHETERIZATION  2012  . KNEE SURGERY     right  . SHOULDER ARTHROSCOPY WITH SUBACROMIAL DECOMPRESSION, ROTATOR CUFF REPAIR AND BICEP TENDON REPAIR Left 05/10/2016   Procedure: SHOULDER  ARTHROSCOPY WITH DEBTRIDEMENT, DECOMPRESSION, REPAIR OF MASSIVE ROTATOR CUFF TEAR AND BICEP TENODESIS;  Surgeon: Corky Mull, MD;  Location: [facility] ORS;  Service: Orthopedics;  Laterality: Left;  Massive cuff tear   Patient Active Problem List   Diagnosis Date Noted  . Renal artery stenosis ([place]) 12/05/2015  . 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 Syncope and collapse 12/05/2015  . Essential hypertension 10/15/2015  . COPD exacerbation ([place]) 01/31/2015  . Acute respiratory failure ([place]) 01/31/2015  . AAA (abdominal aortic aneurysm) ([place]) 11/23/2014  . Prostate cancer screening 11/02/2014  . Gross hematuria 11/02/2014  . Chest pain on exertion 07/16/2014  . Benign hypertension 07/16/2014  . 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 Coccydynia 02/08/2012  . 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 Gait disorder 02/08/2012      Prior to Admission medications   Medication Sig Start Date End Date Taking? Authorizing Provider  albuterol (PROVENTIL HFA;VENTOLIN HFA) 108 (90 Base) MCG/ACT inhaler Inhale 2 puffs into the lungs every 4 (four) hours as needed for wheezing or shortness of breath.     Historical Provider, MD  aspirin EC 81 MG tablet Take 81 mg by mouth at bedtime.     Historical Provider, MD  budesonide-formoterol (SYMBICORT) 160-4.5 MCG/ACT inhaler Inhale 2 puffs into the lungs 2 (two) times daily.    Historical Provider, MD  diazepam (VALIUM) 5  MG tablet Take 5 mg by mouth at bedtime as needed for muscle spasms.  11/01/15   Historical Provider, MD  finasteride (PROSCAR) 5 MG tablet Take 1 tablet (5 mg total) by mouth daily. 11/23/14   Alexis Frock, MD  FLUoxetine (PROZAC) 20 MG capsule Take 20 mg by mouth at bedtime.     Historical Provider, MD  gabapentin (NEURONTIN) 300 MG capsule Take 900 mg by mouth at bedtime.     Historical Provider, MD  HYDROcodone-acetaminophen (NORCO) 7.5-325 MG tablet Take 1 tablet by mouth every 4 (four) hours as needed for moderate  pain.  10/28/15   Historical Provider, MD  losartan (COZAAR) 25 MG tablet Take 25 mg by mouth daily.     Historical Provider, MD  naproxen (NAPROSYN) 500 MG tablet Take 500 mg by mouth 2 (two) times daily as needed.    Historical Provider, MD  nitroGLYCERIN (NITROSTAT) 0.4 MG SL tablet Place 0.4 mg under the tongue every 5 (five) minutes as needed for chest pain.     Historical Provider, MD  simvastatin (ZOCOR) 40 MG tablet Take 40 mg by mouth at bedtime.     Historical Provider, MD  tamsulosin (FLOMAX) 0.4 MG CAPS capsule Take 1 capsule (0.4 mg total) by mouth daily after supper. 11/23/15   Festus Aloe, MD  tiotropium (SPIRIVA) 18 MCG inhalation capsule Place 18 mcg into inhaler and inhale daily.     Historical Provider, MD    Allergies Lidocaine and Penicillins    Social History Social History  Substance Use Topics  . Smoking status: Current Every Day Smoker    Packs/day: 0.50    Years: 30.00    Types: Cigarettes  . Smokeless tobacco: Former Systems developer    Quit date: 02/26/1998  . Alcohol use 0.0 oz/week     Comment: beer daily    Review of Systems Patient denies headaches, rhinorrhea, blurry vision, numbness, shortness of breath, chest pain, edema, cough, abdominal pain, nausea, vomiting, diarrhea, dysuria, fevers, rashes or hallucinations unless otherwise stated above in HPI. ____________________________________________   PHYSICAL EXAM:  VITAL SIGNS: Vitals:   06/15/16 1629  BP: (!) 153/88  Pulse: 65  Resp: 18  Temp: 97.9 F (36.6 C)    Constitutional: Alert and oriented. Well appearing and in no acute distress. Eyes: Conjunctivae are normal. PERRL. EOMI. Head: Atraumatic. Nose: No congestion/rhinnorhea. Mouth/Throat: Mucous membranes are moist.  Oropharynx non-erythematous. Neck: No stridor. Painless ROM. No cervical spine tenderness to palpation Hematological/Lymphatic/Immunilogical: No cervical lymphadenopathy. Cardiovascular: Normal rate, regular rhythm. Grossly  normal heart sounds.  Good peripheral circulation. Respiratory: Normal respiratory effort.  No retractions. Lungs CTAB. Gastrointestinal: Soft and nontender. No distention. No abdominal bruits. No CVA tenderness. Musculoskeletal: No lower extremity tenderness nor edema.  No joint effusions. Neurologic:  Normal speech and language. No gross focal neurologic deficits are appreciated. No gait instability. Skin:  Skin is warm, dry and intact. No rash noted. Psychiatric: Mood and affect are normal. Speech and behavior are normal.  ____________________________________________   LABS (all labs ordered are listed, but only abnormal results are displayed)  Results for orders placed or performed during the hospital encounter of 06/15/16 (from the past 24 hour(s))  CBC with Differential/Platelet     Status: Abnormal   Collection Time: 06/15/16  4:46 PM  Result Value Ref Range   WBC 8.8 3.8 - 10.6 K/uL   RBC 4.35 (L) 4.40 - 5.90 MIL/uL   Hemoglobin 14.5 13.0 - 18.0 g/dL   HCT 42.3 40.0 - 52.0 %  MCV 97.4 80.0 - 100.0 fL   MCH 33.3 26.0 - 34.0 pg   MCHC 34.2 32.0 - 36.0 g/dL   RDW 13.3 11.5 - 14.5 %   Platelets 178 150 - 440 K/uL   Neutrophils Relative % 72 %   Neutro Abs 6.3 1.4 - 6.5 K/uL   Lymphocytes Relative 20 %   Lymphs Abs 1.7 1.0 - 3.6 K/uL   Monocytes Relative 7 %   Monocytes Absolute 0.7 0.2 - 1.0 K/uL   Eosinophils Relative 1 %   Eosinophils Absolute 0.1 0 - 0.7 K/uL   Basophils Relative 0 %   Basophils Absolute 0.0 0 - 0.1 K/uL  Basic metabolic panel     Status: None   Collection Time: 06/15/16  4:46 PM  Result Value Ref Range   Sodium 138 135 - 145 mmol/L   Potassium 4.0 3.5 - 5.1 mmol/L   Chloride 104 101 - 111 mmol/L   CO2 24 22 - 32 mmol/L   Glucose, Bld 87 65 - 99 mg/dL   BUN 10 6 - 20 mg/dL   Creatinine, Ser 1.07 0.61 - 1.24 mg/dL   Calcium 9.0 8.9 - 10.3 mg/dL   GFR calc non Af Amer >60 >60 mL/min   GFR calc Af Amer >60 >60 mL/min   Anion gap 10 5 - 15    Troponin I     Status: None   Collection Time: 06/15/16  4:46 PM  Result Value Ref Range   Troponin I <0.03 <0.03 ng/mL  Ethanol     Status: Abnormal   Collection Time: 06/15/16  4:46 PM  Result Value Ref Range   Alcohol, Ethyl (B) 67 (H) <5 mg/dL   ____________________________________________  EKG My review and personal interpretation at Time: 16:31   Indication: syncope  Rate: 65  Rhythm: sinus Axis: normal Other: normal intervals, no st elevations or depressions ____________________________________________  RADIOLOGY  I personally reviewed all radiographic images ordered to evaluate for the above acute complaints and reviewed radiology reports and findings.  These findings were personally discussed with the patient.  Please see medical record for radiology report.  ____________________________________________   PROCEDURES  Procedure(s) performed:  Procedures    Critical Care performed: no ____________________________________________   INITIAL IMPRESSION / ASSESSMENT AND PLAN / ED COURSE  Pertinent labs & imaging results that were available during my care of the patient were reviewed by me and considered in my medical decision making (see chart for details).  DDX: ich, concussion, dehydration, dysrhythmia, seizure  Joseph Peters is a 61 y.o. who presents to the ED with syncopal episode as described above.  Patient is AFVSS in ED. Exam as above. Given current presentation have considered the above differential.  Patient smells of alcohol. States he has not had much to eat or drink today and as he was outside all day do suspect some component of dehydration. EKG without evidence of acute ischemia or dysrhythmia.  Negative trop.  Will order CT ehad and neck to eval for acute traumatic injury.  The patient will be placed on continuous pulse oximetry and telemetry for monitoring.  Laboratory evaluation will be sent to evaluate for the above complaints.     Clinical  Course as of Jun 15 1853  Fri Jun 15, 2016  1703 PAtient currently refusing CT.  States "I know what is wrong with my head, its been like this for years."   [PR]  0258  ----------------------------------------- 5:04 PM on '@EDTODAY'$ @ -----------------------------------------   OBSERVATION  CARE: This patient is being placed under observation care for the following reasons: Intoxicated head injury patient observed to r/o significant injury    [PR]  1854  ----------------------------------------- 6:54 PM on '@EDTODAY'$ @ -----------------------------------------   END OF OBSERVATION STATUS: After an appropriate period of observation, this patient is being discharged due to the following reason(s):  Patient was able to tolerate PO and was able to ambulate with a steady gait.  Blood work reassuring.  Have discussed with the patient and available family all diagnostics and treatments performed thus far and all questions were answered to the best of my ability. The patient demonstrates understanding and agreement with plan.     [PR]    Clinical Course User Index [PR] Merlyn Lot, MD     ____________________________________________   FINAL CLINICAL IMPRESSION(S) / ED DIAGNOSES  Final diagnoses:  Fainting spell  Alcohol use  Dehydration      NEW MEDICATIONS STARTED DURING THIS VISIT:  New Prescriptions   No medications on file     Note:  This document was prepared using Dragon voice recognition software and may include unintentional dictation errors.    Merlyn Lot, MD 06/15/16 [phone]

## 2016-06-15 NOTE — ED Triage Notes (Signed)
Pt arrived via EMS from home for reports of syncopal episode. Pt states was sitting outside talking to wife and that's the last thing he remembers. Pt reports drinking two beers. Denies taking any narcotic pain medications. EMS reports VSS.

## 2016-06-18 ENCOUNTER — Other Ambulatory Visit: Payer: Self-pay | Admitting: Surgery

## 2016-06-18 DIAGNOSIS — M7522 Bicipital tendinitis, left shoulder: Secondary | ICD-10-CM

## 2016-06-19 ENCOUNTER — Ambulatory Visit
Admission: RE | Admit: 2016-06-19 | Discharge: 2016-06-19 | Disposition: A | Discharge status: Home / Self Care | Payer: Medicare Other | Source: Ambulatory Visit | Attending: Surgery | Admitting: Surgery

## 2016-06-19 DIAGNOSIS — R600 Localized edema: Secondary | ICD-10-CM | POA: Diagnosis not present

## 2016-06-19 DIAGNOSIS — M7522 Bicipital tendinitis, left shoulder: Secondary | ICD-10-CM | POA: Diagnosis not present

## 2016-06-19 DIAGNOSIS — M19012 Primary osteoarthritis, left shoulder: Secondary | ICD-10-CM | POA: Insufficient documentation

## 2016-08-01 ENCOUNTER — Ambulatory Visit (INDEPENDENT_AMBULATORY_CARE_PROVIDER_SITE_OTHER): Payer: Medicare Other | Admitting: Vascular Surgery

## 2016-08-01 ENCOUNTER — Ambulatory Visit (INDEPENDENT_AMBULATORY_CARE_PROVIDER_SITE_OTHER): Payer: Medicare Other

## 2016-08-01 ENCOUNTER — Encounter (INDEPENDENT_AMBULATORY_CARE_PROVIDER_SITE_OTHER): Payer: Self-pay | Admitting: Vascular Surgery

## 2016-08-01 VITALS — BP 143/93 | HR 77 | Resp 16 | Ht 65.0 in | Wt 128.0 lb

## 2016-08-01 DIAGNOSIS — I701 Atherosclerosis of renal artery: Secondary | ICD-10-CM

## 2016-08-01 DIAGNOSIS — I708 Atherosclerosis of other arteries: Secondary | ICD-10-CM

## 2016-08-01 DIAGNOSIS — I714 Abdominal aortic aneurysm, without rupture, unspecified: Secondary | ICD-10-CM

## 2016-08-01 DIAGNOSIS — I10 Essential (primary) hypertension: Secondary | ICD-10-CM

## 2016-08-01 NOTE — Progress Notes (Signed)
Subjective:    Patient ID: Joseph Peters, male    DOB: [birth year], 61 y.o.   MRN: [id number] Chief Complaint  Patient presents with  . Re-evaluation    Renal ultrasound   Patient presents for 6 month follow-up of multiple vascular issues. Today he is without complaint. He denies any postprandial pain, nausea, vomiting. His blood pressure was elevated today at 143/93 however the patient states he generally does not run "high". The patient has not been placed on any new antihypertensive medications. The patient denies any back pain, abdominal pain or thrombosis to the lower extremities. The patient underwent a renal artery duplex exam which is notable for known elevation of the right renal artery. There is elevated velocities of the left renal artery also. When compared to his CT scan on 12/01/2015 a slight progression is noted laterally of the stenosis.   Review of Systems  Constitutional: Negative.   HENT: Negative.   Eyes: Negative.   Respiratory: Negative.   Cardiovascular:       Infrarenal abdominal aneurysm Renal artery stenosis Celiac artery stenosis  Gastrointestinal: Negative.   Endocrine: Negative.   Genitourinary: Negative.   Musculoskeletal: Negative.   Skin: Negative.   Allergic/Immunologic: Negative.   Neurological: Negative.   Hematological: Negative.   Psychiatric/Behavioral: Negative.       Objective:   Physical Exam  Constitutional: He is oriented to person, place, and time. He appears well-developed and well-nourished. No distress.  HENT:  Head: Normocephalic and atraumatic.  Eyes: Conjunctivae are normal. Pupils are equal, round, and reactive to light.  Neck: Normal range of motion.  Cardiovascular: Normal rate, regular rhythm, normal heart sounds and intact distal pulses.   Pulses:      Radial pulses are 2+ on the right side, and 2+ on the left side.       Dorsalis pedis pulses are 2+ on the right side, and 2+ on the left side.       Posterior  tibial pulses are 2+ on the right side, and 2+ on the left side.  Pulmonary/Chest: Effort normal and breath sounds normal.  Abdominal: Soft. Bowel sounds are normal. He exhibits no distension. There is no tenderness. There is no rebound.  Musculoskeletal: Normal range of motion. He exhibits no edema.  Neurological: He is alert and oriented to person, place, and time.  Skin: Skin is warm and dry. He is not diaphoretic.  Psychiatric: He has a normal mood and affect. His behavior is normal. Judgment and thought content normal.  Vitals reviewed.   BP (!) 143/93 (BP Location: Right Arm)   Pulse 77   Resp 16   Ht 5\' 5"  (1.651 m)   Wt 128 lb (58.1 kg)   BMI 21.30 kg/m   Past Medical History:  Diagnosis Date  . AAA (abdominal aortic aneurysm) without rupture ([place])   . Abdominal aneurysm ([place]) 2015   being followed but not large enough to surgically treat  . Anxiety   . Arthritis   . Asthma   . Benign hypertension 07/16/2014  . Chest pain on exertion 07/16/2014  . Chronic pain syndrome   . Coccydynia 02/08/2012  . COPD (chronic obstructive pulmonary disease) ([place])    patient smokes  . Coronary artery disease   . 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 Depression   . 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 IBS (irritable bowel syndrome)   . Myocardial infarction ([place]) 2010  . Restless leg  syndrome   . Sciatica   . Spinal stenosis of lumbar region   . Stroke [facility]) 2015   no residual symptoms    Social History   Social History  . Marital status: Married    Spouse name: N/A  . Number of children: N/A  . Years of education: N/A   Occupational History  . Not on file.   Social History Main Topics  . Smoking status: Current Every Day Smoker    Packs/day: 0.50    Years: 30.00    Types: Cigarettes  . Smokeless tobacco: Former Systems developer    Quit date: 02/26/1998  . Alcohol use 0.0 oz/week     Comment: beer daily  . Drug use: No  . Sexual activity:  Not on file   Other Topics Concern  . Not on file   Social History Narrative  . No narrative on file    Past Surgical History:  Procedure Laterality Date  . CARDIAC CATHETERIZATION  2012  . KNEE SURGERY     right  . SHOULDER ARTHROSCOPY WITH SUBACROMIAL DECOMPRESSION, ROTATOR CUFF REPAIR AND BICEP TENDON REPAIR Left 05/10/2016   Procedure: SHOULDER ARTHROSCOPY WITH DEBTRIDEMENT, DECOMPRESSION, REPAIR OF MASSIVE ROTATOR CUFF TEAR AND BICEP TENODESIS;  Surgeon: Corky Mull, MD;  Location: [facility] ORS;  Service: Orthopedics;  Laterality: Left;  Massive cuff tear    Family History  Problem Relation Age of Onset  . Cancer Mother   . COPD Father 33       black lung    Allergies  Allergen Reactions  . Lidocaine Anaphylaxis  . Penicillins Rash and Other (See Comments)    Has patient had a PCN reaction causing immediate rash, facial/tongue/throat swelling, SOB or lightheadedness with hypotension: No Has patient had a PCN reaction causing severe rash involving mucus membranes or skin necrosis: No Has patient had a PCN reaction that required hospitalization No Has patient had a PCN reaction occurring within the last 10 years: No If all of the above answers are "NO", then may proceed with Cephalosporin use.        Assessment & Plan:  Patient presents for 6 month follow-up of multiple vascular issues. Today he is without complaint. He denies any postprandial pain, nausea, vomiting. His blood pressure was elevated today at 143/93 however the patient states he generally does not run "high". The patient has not been placed on any new antihypertensive medications. The patient denies any back pain, abdominal pain or thrombosis to the lower extremities. The patient underwent a renal artery duplex exam which is notable for known elevation of the right renal artery. There is elevated velocities of the left renal artery also. When compared to his CT scan on 12/01/2015 a slight progression is noted  laterally of the stenosis.  1. Renal artery stenosis (HCC) - Stable Patient with bilateral renal artery stenosis. Right renal artery > left renal artery This is chronic Slight increase in bilateral renal arteries on today's duplex No change in the patient's hypertension management I personally reviewed the patient's recent blood work and there has been no change in renal function No intervention indicated at this time Patient to follow up in 6 months to continue to monitor stenosis  - VAS [place]; Future  2. 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 Abdominal aortic aneurysm (AAA) without rupture ([place]) - asymptomatic Patient is asymptomatic.  As per CTA results on 12/01/2015 infrarenal abdominal aneurysm measuring 3.6 cm x 3.1 cm. We'll bring the patient back in 6 months which will be a year from last abdominal ultrasound to assess any progression of aneurysm.  Current Outpatient Prescriptions on File Prior to Visit  Medication Sig Dispense Refill  . albuterol (PROVENTIL HFA;VENTOLIN HFA) 108 (90 Base) MCG/ACT inhaler Inhale 2 puffs into the lungs every 4 (four) hours as needed for wheezing or shortness of breath.     Marland Kitchen aspirin EC 81 MG tablet Take 81 mg by mouth at bedtime.     . budesonide-formoterol (SYMBICORT) 160-4.5 MCG/ACT inhaler Inhale 2 puffs into the lungs 2 (two) times daily.    . finasteride (PROSCAR) 5 MG tablet Take 1 tablet (5 mg total) by mouth daily. 90 tablet 3  . FLUoxetine (PROZAC) 20 MG capsule Take 20 mg by mouth 2 (two) times daily.     Marland Kitchen gabapentin (NEURONTIN) 300 MG capsule Take 900 mg by mouth at bedtime.     Marland Kitchen HYDROcodone-acetaminophen (NORCO) 7.5-325 MG tablet Take 1 tablet by mouth every 4 (four) hours as needed for moderate  pain.     Marland Kitchen losartan (COZAAR) 25 MG tablet Take 25 mg by mouth daily.     . nitroGLYCERIN (NITROSTAT) 0.4 MG SL tablet Place 0.4 mg under the tongue every 5 (five) minutes as needed for chest pain.     . simvastatin (ZOCOR) 40 MG tablet Take 40 mg by mouth at bedtime.     . tamsulosin (FLOMAX) 0.4 MG CAPS capsule Take 1 capsule (0.4 mg total) by mouth daily after supper. 30 capsule 11  . tiotropium (SPIRIVA) 18 MCG inhalation capsule Place 18 mcg into inhaler and inhale daily.      No current facility-administered medications on file prior to visit.     There are no Patient Instructions on file for this visit. No Follow-up on file.   Crystal Scarberry A Lennin Osmond, PA-C

## 2016-11-14 ENCOUNTER — Ambulatory Visit: Payer: Medicare Other | Admitting: Anesthesiology

## 2016-11-14 ENCOUNTER — Ambulatory Visit
Admission: RE | Admit: 2016-11-14 | Discharge: 2016-11-14 | Disposition: A | Discharge status: Home / Self Care | Payer: Medicare Other | Source: Ambulatory Visit | Attending: Unknown Physician Specialty | Admitting: Unknown Physician Specialty

## 2016-11-14 ENCOUNTER — Encounter
Admission: RE | Disposition: A | Discharge status: Home / Self Care | Payer: Self-pay | Source: Ambulatory Visit | Attending: Unknown Physician Specialty

## 2016-11-14 DIAGNOSIS — K589 Irritable bowel syndrome without diarrhea: Secondary | ICD-10-CM | POA: Insufficient documentation

## 2016-11-14 DIAGNOSIS — Z884 Allergy status to anesthetic agent status: Secondary | ICD-10-CM | POA: Insufficient documentation

## 2016-11-14 DIAGNOSIS — Z8371 Family history of colonic polyps: Secondary | ICD-10-CM | POA: Insufficient documentation

## 2016-11-14 DIAGNOSIS — Z79899 Other long term (current) drug therapy: Secondary | ICD-10-CM | POA: Insufficient documentation

## 2016-11-14 DIAGNOSIS — I252 Old myocardial infarction: Secondary | ICD-10-CM | POA: Diagnosis not present

## 2016-11-14 DIAGNOSIS — M1990 Unspecified osteoarthritis, unspecified site: Secondary | ICD-10-CM | POA: Diagnosis not present

## 2016-11-14 DIAGNOSIS — M48061 Spinal stenosis, lumbar region without neurogenic claudication: Secondary | ICD-10-CM | POA: Diagnosis not present

## 2016-11-14 DIAGNOSIS — G2581 Restless legs syndrome: Secondary | ICD-10-CM | POA: Diagnosis not present

## 2016-11-14 DIAGNOSIS — I2510 Atherosclerotic heart disease of native coronary artery without angina pectoris: Secondary | ICD-10-CM | POA: Diagnosis not present

## 2016-11-14 DIAGNOSIS — G894 Chronic pain syndrome: Secondary | ICD-10-CM | POA: Insufficient documentation

## 2016-11-14 DIAGNOSIS — I739 Peripheral vascular disease, unspecified: Secondary | ICD-10-CM | POA: Insufficient documentation

## 2016-11-14 DIAGNOSIS — F419 Anxiety disorder, unspecified: Secondary | ICD-10-CM | POA: Diagnosis not present

## 2016-11-14 DIAGNOSIS — J449 Chronic obstructive pulmonary disease, unspecified: Secondary | ICD-10-CM | POA: Insufficient documentation

## 2016-11-14 DIAGNOSIS — K2970 Gastritis, unspecified, without bleeding: Secondary | ICD-10-CM | POA: Insufficient documentation

## 2016-11-14 DIAGNOSIS — M5104 Intervertebral disc disorders with myelopathy, thoracic region: Secondary | ICD-10-CM | POA: Diagnosis not present

## 2016-11-14 DIAGNOSIS — K640 First degree hemorrhoids: Secondary | ICD-10-CM | POA: Diagnosis not present

## 2016-11-14 DIAGNOSIS — B3781 Candidal esophagitis: Secondary | ICD-10-CM | POA: Diagnosis not present

## 2016-11-14 DIAGNOSIS — Z8673 Personal history of transient ischemic attack (TIA), and cerebral infarction without residual deficits: Secondary | ICD-10-CM | POA: Insufficient documentation

## 2016-11-14 DIAGNOSIS — I714 Abdominal aortic aneurysm, without rupture: Secondary | ICD-10-CM | POA: Insufficient documentation

## 2016-11-14 DIAGNOSIS — Z1211 Encounter for screening for malignant neoplasm of colon: Secondary | ICD-10-CM | POA: Diagnosis present

## 2016-11-14 DIAGNOSIS — F17210 Nicotine dependence, cigarettes, uncomplicated: Secondary | ICD-10-CM | POA: Insufficient documentation

## 2016-11-14 DIAGNOSIS — D127 Benign neoplasm of rectosigmoid junction: Secondary | ICD-10-CM | POA: Insufficient documentation

## 2016-11-14 DIAGNOSIS — Z809 Family history of malignant neoplasm, unspecified: Secondary | ICD-10-CM | POA: Insufficient documentation

## 2016-11-14 DIAGNOSIS — Z7982 Long term (current) use of aspirin: Secondary | ICD-10-CM | POA: Insufficient documentation

## 2016-11-14 DIAGNOSIS — M533 Sacrococcygeal disorders, not elsewhere classified: Secondary | ICD-10-CM | POA: Diagnosis not present

## 2016-11-14 DIAGNOSIS — R12 Heartburn: Secondary | ICD-10-CM | POA: Insufficient documentation

## 2016-11-14 DIAGNOSIS — F329 Major depressive disorder, single episode, unspecified: Secondary | ICD-10-CM | POA: Insufficient documentation

## 2016-11-14 DIAGNOSIS — I10 Essential (primary) hypertension: Secondary | ICD-10-CM | POA: Insufficient documentation

## 2016-11-14 DIAGNOSIS — Z825 Family history of asthma and other chronic lower respiratory diseases: Secondary | ICD-10-CM | POA: Insufficient documentation

## 2016-11-14 DIAGNOSIS — Z880 Allergy status to penicillin: Secondary | ICD-10-CM | POA: Insufficient documentation

## 2016-11-14 HISTORY — PX: COLONOSCOPY WITH PROPOFOL: SHX5780

## 2016-11-14 HISTORY — PX: ESOPHAGOGASTRODUODENOSCOPY (EGD) WITH PROPOFOL: SHX5813

## 2016-11-14 LAB — KOH PREP

## 2016-11-14 SURGERY — COLONOSCOPY WITH PROPOFOL
Anesthesia: General

## 2016-11-14 MED ORDER — SODIUM CHLORIDE 0.9 % IV SOLN: INTRAVENOUS | Status: DC

## 2016-11-14 MED ORDER — MIDAZOLAM HCL 2 MG/2ML IJ SOLN
INTRAMUSCULAR | Status: DC | PRN
  Administered 2016-11-14: 2 mg via INTRAVENOUS

## 2016-11-14 MED ORDER — PROPOFOL 500 MG/50ML IV EMUL
INTRAVENOUS | Status: AC
  Filled 2016-11-14: qty 50

## 2016-11-14 MED ORDER — PROPOFOL 10 MG/ML IV BOLUS
INTRAVENOUS | Status: DC | PRN
  Administered 2016-11-14: 40 mg via INTRAVENOUS

## 2016-11-14 MED ORDER — SODIUM CHLORIDE 0.9 % IV SOLN
INTRAVENOUS | Status: DC
  Administered 2016-11-14: 1000 mL via INTRAVENOUS

## 2016-11-14 MED ORDER — PROPOFOL 500 MG/50ML IV EMUL
INTRAVENOUS | Status: DC | PRN
  Administered 2016-11-14: 140 ug/kg/min via INTRAVENOUS

## 2016-11-14 MED ORDER — MIDAZOLAM HCL 2 MG/2ML IJ SOLN
INTRAMUSCULAR | Status: AC
  Filled 2016-11-14: qty 2

## 2016-11-14 NOTE — Transfer of Care (Signed)
Immediate Anesthesia Transfer of Care Note  Patient: Joseph Peters  Procedure(s) Performed: Procedure(s): COLONOSCOPY WITH PROPOFOL (N/A) ESOPHAGOGASTRODUODENOSCOPY (EGD) WITH PROPOFOL (N/A)  Patient Location: PACU  Anesthesia Type:General  Level of Consciousness: awake and alert   Airway & Oxygen Therapy: Patient Spontanous Breathing and Patient connected to nasal cannula oxygen  Post-op Assessment: Report given to RN and Post -op Vital signs reviewed and stable  Post vital signs: Reviewed  Last Vitals:  Vitals:   11/14/16 0928  BP: (!) 155/93  Pulse: 77  Resp: 20  Temp: (!) 36 C  SpO2: 99%    Last Pain:  Vitals:   11/14/16 0928  TempSrc: Tympanic         Complications: No apparent anesthesia complications

## 2016-11-14 NOTE — Op Note (Signed)
[facility] Patient Name: Joseph Peters Procedure Date: 11/14/2016 10:11 AM MRN: [id number] Account #: [id number] Date of Birth: 1955-07-21 Admit Type: Outpatient Age: 61 Room: [facility] ENDO ROOM 3 Gender: Male Note Status: Finalized Procedure:            Colonoscopy Indications:          Colon cancer screening in patient at increased risk:                        Family history of 1st-degree relative with colon polyps Providers:            Manya Silvas, MD Referring MD:         Ardyth Man. Santayana (Referring MD) Medicines:            Propofol per Anesthesia Complications:        No immediate complications. Procedure:            Pre-Anesthesia Assessment:                       - After reviewing the risks and benefits, the patient                        was deemed in satisfactory condition to undergo the                        procedure.                       After obtaining informed consent, the colonoscope was                        passed under direct vision. Throughout the procedure,                        the patient's blood pressure, pulse, and oxygen                        saturations were monitored continuously. The                        Colonoscope was introduced through the anus and                        advanced to the the cecum, identified by appendiceal                        orifice and ileocecal valve. The colonoscopy was                        performed without difficulty. The patient tolerated the                        procedure well. The quality of the bowel preparation                        was good. Findings:      A diminutive polyp was found in the recto-sigmoid colon. The polyp was       sessile. The polyp was removed with a jumbo cold forceps. Resection and       retrieval were complete.  Internal hemorrhoids were found during endoscopy. The hemorrhoids were       small, medium-sized and Grade I (internal  hemorrhoids that do not       prolapse).      The exam was otherwise without abnormality. Impression:           - One diminutive polyp at the recto-sigmoid colon,                        removed with a jumbo cold forceps. Resected and                        retrieved.                       - Internal hemorrhoids.                       - The examination was otherwise normal. Recommendation:       - Await pathology results. Manya Silvas, MD 11/14/2016 11:00:19 AM This report has been signed electronically. Number of Addenda: 0 Note Initiated On: 11/14/2016 10:11 AM Scope Withdrawal Time: 0 hours 16 minutes 18 seconds  Total Procedure Duration: 0 hours 22 minutes 18 seconds       [facility]

## 2016-11-14 NOTE — Anesthesia Post-op Follow-up Note (Signed)
Anesthesia QCDR form completed.        

## 2016-11-14 NOTE — Op Note (Signed)
[facility] Gastroenterology Patient Name: Joseph Peters Procedure Date: 11/14/2016 10:12 AM MRN: [id number] Account #: [id number] Date of Birth: 03/01/1955 Admit Type: Outpatient Age: 61 Room: [facility] ENDO ROOM 3 Gender: Male Note Status: Finalized Procedure:            Upper GI endoscopy Indications:          Heartburn Providers:            Manya Silvas, MD Referring MD:         Ardyth Man. Santayana (Referring MD) Medicines:            Propofol per Anesthesia Complications:        No immediate complications. Procedure:            Pre-Anesthesia Assessment:                       - After reviewing the risks and benefits, the patient                        was deemed in satisfactory condition to undergo the                        procedure.                       After obtaining informed consent, the endoscope was                        passed under direct vision. Throughout the procedure,                        the patient's blood pressure, pulse, and oxygen                        saturations were monitored continuously. The                        Colonoscope was introduced through the mouth, and                        advanced to the second part of duodenum. The upper GI                        endoscopy was accomplished without difficulty. The                        patient tolerated the procedure well. Findings:      Diffuse candidiasis was found in the middle third of the esophagus and       in the lower third of the esophagus. Cells for cytology were obtained by       brushing. GEJ 44cm. No varices seen.      Diffuse mild inflammation characterized by erythema and granularity was       found in the gastric body and in the gastric antrum. Biopsies were taken       with a cold forceps for histology. Biopsies were taken with a cold       forceps for Helicobacter pylori testing.      Localized atrophic mucosa was found in the second portion of the   duodenum. Biopsies were taken with a cold forceps for histology. Impression:           -  Monilial esophagitis. Cells for cytology obtained.                       - Gastritis. Biopsied.                       - Duodenal mucosal atrophy. Biopsied. Recommendation:       - Await pathology results. Do colonoscopy. Manya Silvas, MD 11/14/2016 10:33:36 AM This report has been signed electronically. Number of Addenda: 0 Note Initiated On: 11/14/2016 10:12 AM      [facility]

## 2016-11-14 NOTE — Anesthesia Postprocedure Evaluation (Signed)
Anesthesia Post Note  Patient: Joseph Peters  Procedure(s) Performed: Procedure(s) (LRB): COLONOSCOPY WITH PROPOFOL (N/A) ESOPHAGOGASTRODUODENOSCOPY (EGD) WITH PROPOFOL (N/A)  Patient location during evaluation: PACU Anesthesia Type: General Level of consciousness: awake Pain management: pain level controlled Vital Signs Assessment: post-procedure vital signs reviewed and stable Respiratory status: nonlabored ventilation Cardiovascular status: stable Anesthetic complications: no     Last Vitals:  Vitals:   11/14/16 1100 11/14/16 1110  BP: 139/86 (!) 151/85  Pulse: 61 (!) 55  Resp: 14 18  Temp:    SpO2: 100% 100%    Last Pain:  Vitals:   11/14/16 1050  TempSrc: Tympanic                 VAN STAVEREN,[name]

## 2016-11-14 NOTE — Anesthesia Preprocedure Evaluation (Signed)
Anesthesia Evaluation  Patient identified by MRN, date of birth, ID band Patient awake    Reviewed: Allergy & Precautions, NPO status , Patient's Chart, lab work & pertinent test results  Airway Mallampati: II       Dental  (+) Teeth Intact, Partial Lower   Pulmonary COPD, Current Smoker,     + decreased breath sounds      Cardiovascular Exercise Tolerance: Poor hypertension, Pt. on medications + CAD and + Peripheral Vascular Disease   Rhythm:Regular     Neuro/Psych Anxiety Depression Hx of closed head injury    GI/Hepatic negative GI ROS, Neg liver ROS,   Endo/Other  negative endocrine ROS  Renal/GU      Musculoskeletal   Abdominal Normal abdominal exam  (+)   Peds negative pediatric ROS (+)  Hematology negative hematology ROS (+)   Anesthesia Other Findings   Reproductive/Obstetrics                             Anesthesia Physical Anesthesia Plan  ASA: III  Anesthesia Plan: General   Post-op Pain Management:    Induction: Intravenous  PONV Risk Score and Plan: 0  Airway Management Planned: Natural Airway and Nasal Cannula  Additional Equipment:   Intra-op Plan:   Post-operative Plan:   Informed Consent: I have reviewed the patients History and Physical, chart, labs and discussed the procedure including the risks, benefits and alternatives for the proposed anesthesia with the patient or authorized representative who has indicated his/her understanding and acceptance.     Plan Discussed with: Surgeon  Anesthesia Plan Comments:         Anesthesia Quick Evaluation

## 2016-11-14 NOTE — H&P (Signed)
Primary Care Physician:  Gearldine Shown, DO Primary Gastroenterologist:  Dr. Vira Agar  Pre-Procedure History & Physical: HPI:  Joseph Peters is a 61 y.o. male is here for an endoscopy and colonoscopy.   Past Medical History:  Diagnosis Date  . AAA (abdominal aortic aneurysm) without rupture ([place])   . Abdominal aneurysm ([place]) 2015   being followed but not large enough to surgically treat  . Anxiety   . Arthritis   . Asthma   . Benign hypertension 07/16/2014  . Chest pain on exertion 07/16/2014  . Chronic pain syndrome   . Coccydynia 02/08/2012  . COPD (chronic obstructive pulmonary disease) ([place])    patient smokes  . Coronary artery disease   . 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 Depression   . 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 IBS (irritable bowel syndrome)   . Myocardial infarction ([place]) 2010  . Restless leg syndrome   . Sciatica   . Spinal stenosis of lumbar region   . Stroke [facility]) 2015   no residual symptoms    Past Surgical History:  Procedure Laterality Date  . CARDIAC CATHETERIZATION  2012  . KNEE SURGERY     right  . SHOULDER ARTHROSCOPY WITH SUBACROMIAL DECOMPRESSION, ROTATOR CUFF REPAIR AND BICEP TENDON REPAIR Left 05/10/2016   Procedure: SHOULDER ARTHROSCOPY WITH DEBTRIDEMENT, DECOMPRESSION, REPAIR OF MASSIVE ROTATOR CUFF TEAR AND BICEP TENODESIS;  Surgeon: Corky Mull, MD;  Location: [facility] ORS;  Service: Orthopedics;  Laterality: Left;  Massive cuff tear    Prior to Admission medications   Medication Sig Start Date End Date Taking? Authorizing Provider  albuterol (PROVENTIL HFA;VENTOLIN HFA) 108 (90 Base) MCG/ACT inhaler Inhale 2 puffs into the lungs every 4 (four) hours as needed for wheezing or shortness of breath.    Yes [provider]  aspirin EC 81 MG tablet Take 81 mg by mouth at bedtime.    Yes [provider]  budesonide-formoterol (SYMBICORT) 160-4.5 MCG/ACT inhaler Inhale 2  puffs into the lungs 2 (two) times daily.   Yes [provider]  finasteride (PROSCAR) 5 MG tablet Take 1 tablet (5 mg total) by mouth daily. 11/23/14  Yes Alexis Frock, MD  FLUoxetine (PROZAC) 20 MG capsule Take 20 mg by mouth 2 (two) times daily.    Yes [provider]  gabapentin (NEURONTIN) 300 MG capsule Take 900 mg by mouth at bedtime.    Yes [provider]  HYDROcodone-acetaminophen (NORCO) 7.5-325 MG tablet Take 1 tablet by mouth every 4 (four) hours as needed for moderate pain.  10/28/15  Yes [provider]  losartan (COZAAR) 25 MG tablet Take 25 mg by mouth daily.    Yes [provider]  mometasone-formoterol (DULERA) 100-5 MCG/ACT AERO Inhale 2 puffs into the lungs 2 (two) times daily.   Yes [provider]  simvastatin (ZOCOR) 40 MG tablet Take 40 mg by mouth at bedtime.    Yes [provider]  tamsulosin (FLOMAX) 0.4 MG CAPS capsule Take 1 capsule (0.4 mg total) by mouth daily after supper. 11/23/15  Yes Festus Aloe, MD  tiotropium (SPIRIVA) 18 MCG inhalation capsule Place 18 mcg into inhaler and inhale daily.    Yes [provider]  tiZANidine (ZANAFLEX) 4 MG tablet Take 4 mg by mouth every 6 (six) hours as needed for muscle spasms.   Yes [provider]  nitroGLYCERIN (NITROSTAT) 0.4 MG SL tablet Place 0.4 mg under the tongue every  5 (five) minutes as needed for chest pain.     [provider]    Allergies as of 10/15/2016 - Review Complete 08/01/2016  Allergen Reaction Noted  . Lidocaine Anaphylaxis 02/08/2012  . Penicillins Rash and Other (See Comments) 02/08/2012    Family History  Problem Relation Age of Onset  . Cancer Mother   . COPD Father 91       black lung    Social History   Social History  . Marital status: Married    Spouse name: N/A  . Number of children: N/A  . Years of education: N/A   Occupational History  . Not on file.   Social History Main Topics   . Smoking status: Current Every Day Smoker    Packs/day: 0.50    Years: 30.00    Types: Cigarettes  . Smokeless tobacco: Former Systems developer    Quit date: 02/26/1998  . Alcohol use 0.0 oz/week     Comment: beer daily  . Drug use: No  . Sexual activity: Not on file   Other Topics Concern  . Not on file   Social History Narrative  . No narrative on file    Review of Systems: See HPI, otherwise negative ROS  Physical Exam: BP (!) 155/93   Pulse 77   Temp (!) 96.8 F (36 C) (Tympanic)   Resp 20   Ht 5\' 5"  (1.651 m)   Wt 61.2 kg (135 lb)   SpO2 99%   BMI 22.47 kg/m  General:   Alert,  pleasant and cooperative in NAD Head:  Normocephalic and atraumatic. Neck:  Supple; no masses or thyromegaly. Lungs:  Clear throughout to auscultation.    Heart:  Regular rate and rhythm. Abdomen:  Soft, nontender and nondistended. Normal bowel sounds, without guarding, and without rebound.   Neurologic:  Alert and  oriented x4;  grossly normal neurologically.  Impression/Plan: [name] is here for an endoscopy and colonoscopy to be performed for heartburn, and family history of colon polyp in sister.  Risks, benefits, limitations, and alternatives regarding  endoscopy and colonoscopy have been reviewed with the patient.  Questions have been answered.  All parties agreeable.   Gaylyn Cheers, MD  11/14/2016, 10:06 AM

## 2016-11-15 ENCOUNTER — Other Ambulatory Visit: Payer: Self-pay

## 2016-11-15 ENCOUNTER — Encounter: Payer: Self-pay | Admitting: Unknown Physician Specialty

## 2016-11-15 DIAGNOSIS — N401 Enlarged prostate with lower urinary tract symptoms: Secondary | ICD-10-CM

## 2016-11-15 MED ORDER — TAMSULOSIN HCL 0.4 MG PO CAPS: 0.4000 mg | ORAL_CAPSULE | Freq: Every day | ORAL | 0 refills | Status: DC

## 2016-11-16 LAB — SURGICAL PATHOLOGY

## 2016-11-20 ENCOUNTER — Ambulatory Visit: Payer: Medicare Other

## 2016-11-21 ENCOUNTER — Ambulatory Visit: Payer: Medicare Other | Admitting: Urology

## 2016-12-11 ENCOUNTER — Encounter: Payer: Self-pay | Admitting: Urology

## 2016-12-11 ENCOUNTER — Ambulatory Visit: Payer: Medicare Other | Admitting: Urology

## 2016-12-25 ENCOUNTER — Ambulatory Visit (INDEPENDENT_AMBULATORY_CARE_PROVIDER_SITE_OTHER): Payer: Medicare Other | Admitting: Urology

## 2016-12-25 ENCOUNTER — Encounter: Payer: Self-pay | Admitting: Urology

## 2016-12-25 VITALS — BP 150/94 | HR 78 | Ht 65.0 in | Wt 130.5 lb

## 2016-12-25 DIAGNOSIS — N401 Enlarged prostate with lower urinary tract symptoms: Secondary | ICD-10-CM

## 2016-12-25 DIAGNOSIS — Z87448 Personal history of other diseases of urinary system: Secondary | ICD-10-CM

## 2016-12-25 DIAGNOSIS — K219 Gastro-esophageal reflux disease without esophagitis: Secondary | ICD-10-CM | POA: Insufficient documentation

## 2016-12-25 DIAGNOSIS — R3911 Hesitancy of micturition: Secondary | ICD-10-CM

## 2016-12-25 DIAGNOSIS — R310 Gross hematuria: Secondary | ICD-10-CM

## 2016-12-25 DIAGNOSIS — I701 Atherosclerosis of renal artery: Secondary | ICD-10-CM | POA: Diagnosis not present

## 2016-12-25 DIAGNOSIS — N138 Other obstructive and reflux uropathy: Secondary | ICD-10-CM | POA: Diagnosis not present

## 2016-12-25 LAB — BLADDER SCAN AMB NON-IMAGING: Scan Result: 131

## 2016-12-25 MED ORDER — TAMSULOSIN HCL 0.4 MG PO CAPS: 0.4000 mg | ORAL_CAPSULE | Freq: Every day | ORAL | 3 refills | Status: DC

## 2016-12-25 MED ORDER — FINASTERIDE 5 MG PO TABS: 5.0000 mg | ORAL_TABLET | Freq: Every day | ORAL | 3 refills | Status: DC

## 2016-12-25 NOTE — Progress Notes (Signed)
12/25/2016 9:21 AM   Joseph Peters [birth year] [id number]  Referring provider: Gearldine Shown, [place], [place] [address]  Chief Complaint  Patient presents with  . Follow-up    HPI: 1 - BPH with Lower Urinary Tract Symptoms / Dysuria- pt with sensation of "peeing glass" when hematuria present. PVR 10/2014 "35mL", DRE 40gm. UA w/o bacteruria at time. Started finasteride 2016. Added tamsulosin in 2017 for hesitancy. Nl PVR.   2 - Gross Hematuria- visible terminal hematuria x several mos. 40PY smoker, still smokes. CT Urogram 10/2014 w/o GU mass / stone. Cysto 10/2014 with significant bilobar prostatic hypertrophy with varices. He was started on finasteride for prostatic bleeding.   3 - Prostate Screening- no FHX prostate cancer. 10/2014: PSA 0.4  10/2015: PSA 0.5  4 - Abdominal Aortic Aneurysm- incidental 3.2cm infrarenal AAA by hematuria CT 2016. He is smoker. There is a 70% right renal artery stenosis. There is no amount hemodynamically significant stenosis on the left.  Today, pt seen for the above, He's had no gross hematuria. He ran out of tamsulosin and finasteride. Mainly deals with straining to void. PVR 131 ml. He's had no gross hematuria. He's straining a lot to void and it takes a long time to void. PMH sig for ASA use for CAD, COPD.    PMH: Past Medical History:  Diagnosis Date  . AAA (abdominal aortic aneurysm) without rupture ([place])   . Abdominal aneurysm ([place]) 2015   being followed but not large enough to surgically treat  . Anxiety   . Arthritis   . Asthma   . Benign hypertension 07/16/2014  . Chest pain on exertion 07/16/2014  . Chronic pain syndrome   . Coccydynia 02/08/2012  . COPD (chronic obstructive pulmonary disease) ([place])    patient smokes  . Coronary artery disease   . 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 Depression   . 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 IBS (irritable bowel  syndrome)   . Myocardial infarction ([place]) 2010  . Restless leg syndrome   . Sciatica   . Spinal stenosis of lumbar region   . Stroke [facility]) 2015   no residual symptoms    Surgical History: Past Surgical History:  Procedure Laterality Date  . CARDIAC CATHETERIZATION  2012  . COLONOSCOPY WITH PROPOFOL N/A 11/14/2016   Procedure: COLONOSCOPY WITH PROPOFOL;  Surgeon: Manya Silvas, MD;  Location: [facility] ENDOSCOPY;  Service: Endoscopy;  Laterality: N/A;  . ESOPHAGOGASTRODUODENOSCOPY (EGD) WITH PROPOFOL N/A 11/14/2016   Procedure: ESOPHAGOGASTRODUODENOSCOPY (EGD) WITH PROPOFOL;  Surgeon: Manya Silvas, MD;  Location: [facility] ENDOSCOPY;  Service: Endoscopy;  Laterality: N/A;  . KNEE SURGERY     right  . SHOULDER ARTHROSCOPY WITH SUBACROMIAL DECOMPRESSION, ROTATOR CUFF REPAIR AND BICEP TENDON REPAIR Left 05/10/2016   Procedure: SHOULDER ARTHROSCOPY WITH DEBTRIDEMENT, DECOMPRESSION, REPAIR OF MASSIVE ROTATOR CUFF TEAR AND BICEP TENODESIS;  Surgeon: Corky Mull, MD;  Location: [facility] ORS;  Service: Orthopedics;  Laterality: Left;  Massive cuff tear    Home Medications:  Allergies as of 12/25/2016      Reactions   Lidocaine Anaphylaxis   Penicillins Rash, Other (See Comments)   Has patient had a PCN reaction causing immediate rash, facial/tongue/throat swelling, SOB or lightheadedness with hypotension: No Has patient had a PCN reaction causing severe rash involving mucus membranes or skin necrosis: No Has patient had a PCN reaction that required hospitalization No Has patient had a PCN  reaction occurring within the last 10 years: No If all of the above answers are "NO", then may proceed with Cephalosporin use.      Medication List       Accurate as of 12/25/16  9:21 AM. Always use your most recent med list.          albuterol 108 (90 Base) MCG/ACT inhaler Commonly known as:  PROVENTIL HFA;VENTOLIN HFA Inhale 2 puffs into the lungs every 4 (four) hours as needed for wheezing or  shortness of breath.   aspirin EC 81 MG tablet Take 81 mg by mouth at bedtime.   budesonide-formoterol 160-4.5 MCG/ACT inhaler Commonly known as:  SYMBICORT Inhale 2 puffs into the lungs 2 (two) times daily.   finasteride 5 MG tablet Commonly known as:  PROSCAR Take 1 tablet (5 mg total) by mouth daily.   FLUoxetine 20 MG capsule Commonly known as:  PROZAC Take 20 mg by mouth 2 (two) times daily.   gabapentin 300 MG capsule Commonly known as:  NEURONTIN Take 900 mg by mouth at bedtime.   HYDROcodone-acetaminophen 7.5-325 MG tablet Commonly known as:  NORCO Take 1 tablet by mouth every 4 (four) hours as needed for moderate pain.   losartan 25 MG tablet Commonly known as:  COZAAR Take 25 mg by mouth daily.   mometasone-formoterol 100-5 MCG/ACT Aero Commonly known as:  DULERA Inhale 2 puffs into the lungs 2 (two) times daily.   nitroGLYCERIN 0.4 MG SL tablet Commonly known as:  NITROSTAT Place 0.4 mg under the tongue every 5 (five) minutes as needed for chest pain.   simvastatin 40 MG tablet Commonly known as:  ZOCOR Take 40 mg by mouth at bedtime.   tamsulosin 0.4 MG Caps capsule Commonly known as:  FLOMAX Take 1 capsule (0.4 mg total) by mouth daily after supper.   tiotropium 18 MCG inhalation capsule Commonly known as:  SPIRIVA Place 18 mcg into inhaler and inhale daily.   tiZANidine 4 MG tablet Commonly known as:  ZANAFLEX Take 4 mg by mouth every 6 (six) hours as needed for muscle spasms.       Allergies:  Allergies  Allergen Reactions  . Lidocaine Anaphylaxis  . Penicillins Rash and Other (See Comments)    Has patient had a PCN reaction causing immediate rash, facial/tongue/throat swelling, SOB or lightheadedness with hypotension: No Has patient had a PCN reaction causing severe rash involving mucus membranes or skin necrosis: No Has patient had a PCN reaction that required hospitalization No Has patient had a PCN reaction occurring within the last  10 years: No If all of the above answers are "NO", then may proceed with Cephalosporin use.     Family History: Family History  Problem Relation Age of Onset  . Cancer Mother   . COPD Father 85       black lung    Social History:  reports that he has been smoking Cigarettes.  He has a 15.00 pack-year smoking history. He quit smokeless tobacco use about 18 years ago. He reports that he drinks alcohol. He reports that he does not use drugs.  ROS: UROLOGY Frequent Urination?: No Hard to postpone urination?: No Burning/pain with urination?: No Get up at night to urinate?: No Leakage of urine?: No Urine stream starts and stops?: No Trouble starting stream?: No Do you have to strain to urinate?: Yes Blood in urine?: No Urinary tract infection?: No Sexually transmitted disease?: No Injury to kidneys or bladder?: No Painful intercourse?: No Weak stream?:  No Erection problems?: No Penile pain?: No  Gastrointestinal Nausea?: No Vomiting?: No Indigestion/heartburn?: No Diarrhea?: Yes Constipation?: No  Constitutional Fever: No Night sweats?: No Weight loss?: No Fatigue?: No  Skin Skin rash/lesions?: No Itching?: No  Eyes Blurred vision?: No Double vision?: No  Ears/Nose/Throat Sore throat?: No Sinus problems?: Yes  Hematologic/Lymphatic Swollen glands?: No Easy bruising?: Yes  Cardiovascular Leg swelling?: No Chest pain?: Yes  Respiratory Cough?: Yes Shortness of breath?: Yes  Endocrine Excessive thirst?: Yes  Musculoskeletal Back pain?: Yes Joint pain?: Yes  Neurological Headaches?: No Dizziness?: Yes  Psychologic Depression?: Yes Anxiety?: Yes  Physical Exam: BP (!) 150/94 (BP Location: Right Arm, Patient Position: Sitting, Cuff Size: Normal)   Pulse 78   Ht 5\' 5"  (1.651 m)   Wt 59.2 kg (130 lb 8 oz)   BMI 21.72 kg/m   Constitutional:  Alert and oriented, No acute distress. HEENT: [place] AT, moist mucus membranes.  Trachea midline, no  masses. Cardiovascular: No clubbing, cyanosis, or edema. Respiratory: Normal respiratory effort, no increased work of breathing. GI: Abdomen is soft, nontender, nondistended, no abdominal masses GU: No CVA tenderness. DRE: 50 g, smooth, no hard area or nodule  Skin: No rashes, bruises or suspicious lesions. Lymph: No cervical or inguinal adenopathy. Neurologic: Grossly intact, no focal deficits, moving all 4 extremities. Psychiatric: Normal mood and affect.  Laboratory Data: Lab Results  Component Value Date   WBC 8.8 06/15/2016   HGB 14.5 06/15/2016   HCT 42.3 06/15/2016   MCV 97.4 06/15/2016   PLT 178 06/15/2016    Lab Results  Component Value Date   CREATININE 1.07 06/15/2016    Lab Results  Component Value Date   PSA1 0.5 11/23/2015   PSA1 0.4 11/02/2014    No results found for: TESTOSTERONE  No results found for: HGBA1C  Urinalysis Lab Results  Component Value Date   SPECGRAV 1.015 11/02/2014   PHUR 7.0 11/02/2014   COLORU Yellow 11/02/2014   APPEARANCEUR Clear 11/02/2014   LEUKOCYTESUR Negative 11/02/2014   PROTEINUR Negative 11/02/2014   GLUCOSEU Negative 11/02/2014   KETONESU Negative 11/02/2014   RBCU Negative 11/02/2014   BILIRUBINUR Negative 11/02/2014   UUROB 0.2 11/02/2014   NITRITE Negative 11/02/2014    Lab Results  Component Value Date   LABMICR See below: 11/02/2014   WBCUA 0-5 11/02/2014   RBCUA None seen 11/02/2014   LABEPIT None seen 11/02/2014   MUCUS Present (A) 11/02/2014   BACTERIA None seen 11/02/2014   CT report and vascular surgery note from 2017 reviewed. Vascular f/u in Dec.   No results found for this or any previous visit. No results found for this or any previous visit. No results found for this or any previous visit. No results found for this or any previous visit. No results found for this or any previous visit. No results found for this or any previous visit. No results found for this or any previous visit. No  results found for this or any previous visit.  Assessment & Plan:    1. BPH with LUTS - restart tamsulosin and finasteride. Instructed to return if voiding does not improve. PSA was sent. See in 1 year or sooner if issues.  - Bladder Scan (Post Void Residual) in office   No Follow-up on file.  Festus Aloe, [facility] [address] [place],  [address] [phone]

## 2016-12-26 LAB — PSA: Prostate Specific Ag, Serum: 0.3 ng/mL (ref 0.0–4.0)

## 2016-12-28 ENCOUNTER — Telehealth: Payer: Self-pay

## 2016-12-28 NOTE — Telephone Encounter (Signed)
Joseph Aloe, MD  [name], LPN        Notify patient - PSA normal -- give result    Spoke with pt in reference to PSA results. Pt voiced understanding.

## 2017-01-30 ENCOUNTER — Ambulatory Visit (INDEPENDENT_AMBULATORY_CARE_PROVIDER_SITE_OTHER): Payer: Medicare Other

## 2017-01-30 DIAGNOSIS — I701 Atherosclerosis of renal artery: Secondary | ICD-10-CM

## 2017-01-31 ENCOUNTER — Ambulatory Visit (INDEPENDENT_AMBULATORY_CARE_PROVIDER_SITE_OTHER): Payer: Medicare Other

## 2017-01-31 ENCOUNTER — Ambulatory Visit (INDEPENDENT_AMBULATORY_CARE_PROVIDER_SITE_OTHER): Payer: Medicare Other | Admitting: Vascular Surgery

## 2017-01-31 ENCOUNTER — Encounter (INDEPENDENT_AMBULATORY_CARE_PROVIDER_SITE_OTHER): Payer: Self-pay | Admitting: Vascular Surgery

## 2017-01-31 VITALS — BP 158/96 | HR 72 | Resp 17 | Ht 65.0 in | Wt 133.0 lb

## 2017-01-31 DIAGNOSIS — I714 Abdominal aortic aneurysm, without rupture, unspecified: Secondary | ICD-10-CM

## 2017-01-31 DIAGNOSIS — I708 Atherosclerosis of other arteries: Secondary | ICD-10-CM | POA: Diagnosis not present

## 2017-01-31 DIAGNOSIS — I10 Essential (primary) hypertension: Secondary | ICD-10-CM | POA: Diagnosis not present

## 2017-01-31 DIAGNOSIS — J441 Chronic obstructive pulmonary disease with (acute) exacerbation: Secondary | ICD-10-CM

## 2017-01-31 DIAGNOSIS — I701 Atherosclerosis of renal artery: Secondary | ICD-10-CM

## 2017-01-31 NOTE — Progress Notes (Signed)
MRN : [id number]  Joseph Peters is a 61 y.o. (1955/03/17) male who presents with chief complaint of  Chief Complaint  Patient presents with  . Follow-up    6 month mesenteric u/s  .  History of Present Illness: The patient returns to the office for surveillance of a known abdominal aortic aneurysm. Patient denies abdominal pain or back pain, no other abdominal complaints. No changes suggesting embolic episodes.   There have been no interval changes in the patient's overall health care since his last visit. Although he notes his COPD is getting worse.  No abdominal pain or weight loss.  His BP has been stable.  Patient denies amaurosis fugax or TIA symptoms. There is no history of claudication or rest pain symptoms of the lower extremities. The patient denies angina or shortness of breath.    Current Meds  Medication Sig  . albuterol (PROVENTIL HFA;VENTOLIN HFA) 108 (90 Base) MCG/ACT inhaler Inhale 2 puffs into the lungs every 4 (four) hours as needed for wheezing or shortness of breath.   Marland Kitchen aspirin EC 81 MG tablet Take 81 mg by mouth at bedtime.   . budesonide-formoterol (SYMBICORT) 160-4.5 MCG/ACT inhaler Inhale 2 puffs into the lungs 2 (two) times daily.  . diazepam (VALIUM) 5 MG tablet TK 1 T PO 2 TO 3 TIMES A WK PRF LEG CRAMPS  . finasteride (PROSCAR) 5 MG tablet Take 1 tablet (5 mg total) by mouth daily.  Marland Kitchen FLUoxetine (PROZAC) 20 MG capsule Take 20 mg by mouth 2 (two) times daily.   Marland Kitchen gabapentin (NEURONTIN) 300 MG capsule Take 900 mg by mouth at bedtime.   Marland Kitchen HYDROcodone-acetaminophen (NORCO) 7.5-325 MG tablet Take 1 tablet by mouth every 4 (four) hours as needed for moderate pain.   Marland Kitchen losartan (COZAAR) 25 MG tablet Take 25 mg by mouth daily.   . mometasone-formoterol (DULERA) 100-5 MCG/ACT AERO Inhale 2 puffs into the lungs 2 (two) times daily.  . nitroGLYCERIN (NITROSTAT) 0.4 MG SL tablet Place 0.4 mg under the tongue every 5 (five) minutes as needed for chest pain.   Marland Kitchen  nystatin (MYCOSTATIN) 100000 UNIT/ML suspension SAS 5 ML PO QID FOR 7 DAYS  . omeprazole (PRILOSEC) 20 MG capsule Take by mouth.  . Oxycodone HCl 10 MG TABS TK 1 T PO QID FOR 30 DAYS  . simvastatin (ZOCOR) 40 MG tablet Take 40 mg by mouth at bedtime.   . tamsulosin (FLOMAX) 0.4 MG CAPS capsule Take 1 capsule (0.4 mg total) by mouth daily after supper.  . tiotropium (SPIRIVA) 18 MCG inhalation capsule Place 18 mcg into inhaler and inhale daily.   Marland Kitchen tiZANidine (ZANAFLEX) 4 MG tablet Take 4 mg by mouth every 6 (six) hours as needed for muscle spasms.    Past Medical History:  Diagnosis Date  . AAA (abdominal aortic aneurysm) without rupture ([place])   . Abdominal aneurysm ([place]) 2015   being followed but not large enough to surgically treat  . Anxiety   . Arthritis   . Asthma   . Benign hypertension 07/16/2014  . Chest pain on exertion 07/16/2014  . Chronic pain syndrome   . Coccydynia 02/08/2012  . COPD (chronic obstructive pulmonary disease) ([place])    patient smokes  . Coronary artery disease   . 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 Depression   . 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 IBS (irritable bowel syndrome)   . Myocardial infarction ([place]) 2010  .  Restless leg syndrome   . Sciatica   . Spinal stenosis of lumbar region   . Stroke [facility]) 2015   no residual symptoms    Past Surgical History:  Procedure Laterality Date  . CARDIAC CATHETERIZATION  2012  . COLONOSCOPY WITH PROPOFOL N/A 11/14/2016   Procedure: COLONOSCOPY WITH PROPOFOL;  Surgeon: Manya Silvas, MD;  Location: [facility] ENDOSCOPY;  Service: Endoscopy;  Laterality: N/A;  . ESOPHAGOGASTRODUODENOSCOPY (EGD) WITH PROPOFOL N/A 11/14/2016   Procedure: ESOPHAGOGASTRODUODENOSCOPY (EGD) WITH PROPOFOL;  Surgeon: Manya Silvas, MD;  Location: [facility] ENDOSCOPY;  Service: Endoscopy;  Laterality: N/A;  . KNEE SURGERY     right  . SHOULDER ARTHROSCOPY WITH SUBACROMIAL DECOMPRESSION, ROTATOR  CUFF REPAIR AND BICEP TENDON REPAIR Left 05/10/2016   Procedure: SHOULDER ARTHROSCOPY WITH DEBTRIDEMENT, DECOMPRESSION, REPAIR OF MASSIVE ROTATOR CUFF TEAR AND BICEP TENODESIS;  Surgeon: Corky Mull, MD;  Location: [facility] ORS;  Service: Orthopedics;  Laterality: Left;  Massive cuff tear    Social History Social History   Tobacco Use  . Smoking status: Current Every Day Smoker    Packs/day: 0.50    Years: 30.00    Pack years: 15.00    Types: Cigarettes  . Smokeless tobacco: Former Systems developer    Quit date: 02/26/1998  Substance Use Topics  . Alcohol use: Yes    Alcohol/week: 0.0 oz    Comment: beer daily  . Drug use: No    Family History Family History  Problem Relation Age of Onset  . Cancer Mother   . COPD Father 62       black lung    Allergies  Allergen Reactions  . Lidocaine Anaphylaxis  . Penicillins Rash and Other (See Comments)    Has patient had a PCN reaction causing immediate rash, facial/tongue/throat swelling, SOB or lightheadedness with hypotension: No Has patient had a PCN reaction causing severe rash involving mucus membranes or skin necrosis: No Has patient had a PCN reaction that required hospitalization No Has patient had a PCN reaction occurring within the last 10 years: No If all of the above answers are "NO", then may proceed with Cephalosporin use.      REVIEW OF SYSTEMS (Negative unless checked)  Constitutional: [] Weight loss  [] Fever  [] Chills Cardiac: [] Chest pain   [] Chest pressure   [] Palpitations   [] Shortness of breath when laying flat   [] Shortness of breath with exertion. Vascular:  [] Pain in legs with walking   [] Pain in legs at rest  [] History of DVT   [] Phlebitis   [] Swelling in legs   [] Varicose veins   [] Non-healing ulcers Pulmonary:   [] Uses home oxygen   [] Productive cough   [] Hemoptysis   [] Wheeze  [] COPD   [] Asthma Neurologic:  [] Dizziness   [] Seizures   [] History of stroke   [] History of TIA  [] Aphasia   [] Vissual changes   [] Weakness or  numbness in arm   [] Weakness or numbness in leg Musculoskeletal:   [] Joint swelling   [] Joint pain   [] Low back pain Hematologic:  [] Easy bruising  [] Easy bleeding   [] Hypercoagulable state   [] Anemic Gastrointestinal:  [] Diarrhea   [] Vomiting  [] Gastroesophageal reflux/heartburn   [] Difficulty swallowing. Genitourinary:  [] Chronic kidney disease   [] Difficult urination  [] Frequent urination   [] Blood in urine Skin:  [] Rashes   [] Ulcers  Psychological:  [] History of anxiety   []  History of major depression.  Physical Examination  Vitals:   01/31/17 0841  BP: (!) 158/96  Pulse: 72  Resp: 17  Weight:  60.3 kg (133 lb)  Height: 5\' 5"  (1.651 m)   Body mass index is 22.13 kg/m. Gen: WD/WN, NAD Head: [place]/AT, No temporalis wasting.  Ear/Nose/Throat: Hearing grossly intact, nares w/o erythema or drainage Eyes: PER, EOMI, sclera nonicteric.  Neck: Supple, no large masses.   Pulmonary:  Good air movement, no audible wheezing bilaterally, no use of accessory muscles.  Cardiac: RRR, no JVD Vascular:  Vessel Right Left  Radial Palpable Palpable  Ulnar Palpable Palpable  Brachial Palpable Palpable  Carotid Palpable Palpable  Femoral Palpable Palpable  Popliteal Palpable Palpable  PT Palpable Palpable  DP Palpable Palpable  Gastrointestinal: Non-distended. No guarding/no peritoneal signs.  Musculoskeletal: M/S 5/5 throughout.  No deformity or atrophy.  Neurologic: CN 2-12 intact. Symmetrical.  Speech is fluent. Motor exam as listed above. Psychiatric: Judgment intact, Mood & affect appropriate for pt's clinical situation. Dermatologic: No rashes or ulcers noted.  No changes consistent with cellulitis. Lymph : No lichenification or skin changes of chronic lymphedema.  CBC Lab Results  Component Value Date   WBC 8.8 06/15/2016   HGB 14.5 06/15/2016   HCT 42.3 06/15/2016   MCV 97.4 06/15/2016   PLT 178 06/15/2016    BMET    Component Value Date/Time   NA 138 06/15/2016 1646   NA  138 11/02/2014 1635   NA 139 06/23/2012 1028   K 4.0 06/15/2016 1646   K 4.6 06/23/2012 1028   CL 104 06/15/2016 1646   CL 105 06/23/2012 1028   CO2 24 06/15/2016 1646   CO2 26 06/23/2012 1028   GLUCOSE 87 06/15/2016 1646   GLUCOSE 88 06/23/2012 1028   BUN 10 06/15/2016 1646   BUN 14 11/02/2014 1635   BUN 12 06/23/2012 1028   CREATININE 1.07 06/15/2016 1646   CREATININE 0.84 06/23/2012 1028   CALCIUM 9.0 06/15/2016 1646   CALCIUM 8.8 06/23/2012 1028   GFRNONAA >60 06/15/2016 1646   GFRNONAA >60 06/23/2012 1028   GFRAA >60 06/15/2016 1646   GFRAA >60 06/23/2012 1028   CrCl cannot be calculated (Patient's most recent lab result is older than the maximum 21 days allowed.).  COAG No results found for: INR, PROTIME  Radiology No results found.   Assessment/Plan 1. Abdominal aortic aneurysm (AAA) without rupture ([place]) No surgery or intervention at this time. The patient has an asymptomatic abdominal aortic aneurysm that is less than 4 cm in maximal diameter.  I have discussed the natural history of abdominal aortic aneurysm and the small risk of rupture for aneurysm less than 5 cm in size.  However, as these small aneurysms tend to enlarge over time, continued surveillance with ultrasound or CT scan is mandatory.  I have also discussed optimizing medical management with hypertension and lipid control and the importance of abstinence from tobacco.  The patient is also encouraged to exercise a minimum of 30 minutes 4 times a week.  Should the patient develop new onset abdominal or back pain or signs of peripheral embolization they are instructed to seek medical attention immediately and to alert the physician providing care that they have an aneurysm.  The patient voices their understanding. The patient will return in 12 months with an aortic duplex.   2. Renal artery stenosis (HCC) Given patient's arterial disease optimal control of the patient's hypertension is important. BP is  acceptable today  The patient's vital signs and noninvasive studies support the renal artery stenosis is not significantly increased when compared to the previous study.  No invasive studies or  intervention is indicated at this time.  The patient will continue the current antihypertensive medications, no changes at this time.  The primary medical service will continue aggressive antihypertensive therapy as per the AHA guidelines   - VAS US RENAL ARTERY DUPLEX; Future  3. 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  The patient does not voice lifestyle limiting changes at this point in time.  Noninvasive studies do not suggest clinically significant change.  No invasive studies, angiography or surgery at this time  The patient should continue walking and begin a more formal exercise program.  The patient should continue antiplatelet therapy and aggressive treatment of the lipid abnormalities  No changes in the patient's medications at this time  The patient should continue wearing graduated compression socks 10-15 mmHg strength to control the mild edema.   - VAS [place] MESENTERIC; Future  4. Essential hypertension Continue antihypertensive medications as already ordered, these medications have been reviewed and there are no changes at this time.   5. COPD exacerbation ([place]) Continue pulmonary medications and aerosols as already ordered, these medications have been reviewed and there are no changes at this time.      Hortencia Pilar, MD  01/31/2017 8:46 AM

## 2017-05-28 DIAGNOSIS — G250 Essential tremor: Secondary | ICD-10-CM | POA: Insufficient documentation

## 2017-06-12 ENCOUNTER — Other Ambulatory Visit: Payer: Self-pay | Admitting: Specialist

## 2017-06-12 DIAGNOSIS — J449 Chronic obstructive pulmonary disease, unspecified: Secondary | ICD-10-CM

## 2017-06-12 DIAGNOSIS — R059 Cough, unspecified: Secondary | ICD-10-CM

## 2017-06-12 DIAGNOSIS — R0609 Other forms of dyspnea: Principal | ICD-10-CM

## 2017-06-12 DIAGNOSIS — Z77090 Contact with and (suspected) exposure to asbestos: Secondary | ICD-10-CM

## 2017-06-12 DIAGNOSIS — R05 Cough: Secondary | ICD-10-CM

## 2017-06-17 ENCOUNTER — Ambulatory Visit
Admission: RE | Admit: 2017-06-17 | Discharge: 2017-06-17 | Disposition: A | Discharge status: Home / Self Care | Payer: Medicare Other | Source: Ambulatory Visit | Attending: Specialist | Admitting: Specialist

## 2017-06-17 DIAGNOSIS — I714 Abdominal aortic aneurysm, without rupture: Secondary | ICD-10-CM | POA: Diagnosis not present

## 2017-06-17 DIAGNOSIS — I2510 Atherosclerotic heart disease of native coronary artery without angina pectoris: Secondary | ICD-10-CM | POA: Insufficient documentation

## 2017-06-17 DIAGNOSIS — J449 Chronic obstructive pulmonary disease, unspecified: Secondary | ICD-10-CM | POA: Insufficient documentation

## 2017-06-17 DIAGNOSIS — R0609 Other forms of dyspnea: Secondary | ICD-10-CM | POA: Insufficient documentation

## 2017-06-17 DIAGNOSIS — I712 Thoracic aortic aneurysm, without rupture: Secondary | ICD-10-CM | POA: Diagnosis not present

## 2017-06-17 DIAGNOSIS — R05 Cough: Secondary | ICD-10-CM | POA: Insufficient documentation

## 2017-06-17 DIAGNOSIS — Z77090 Contact with and (suspected) exposure to asbestos: Secondary | ICD-10-CM | POA: Diagnosis not present

## 2017-06-17 DIAGNOSIS — I700 Atherosclerosis of aorta: Secondary | ICD-10-CM | POA: Insufficient documentation

## 2017-06-17 DIAGNOSIS — R059 Cough, unspecified: Secondary | ICD-10-CM

## 2017-07-30 ENCOUNTER — Other Ambulatory Visit: Payer: Self-pay | Admitting: Orthopedic Surgery

## 2017-07-30 DIAGNOSIS — M75101 Unspecified rotator cuff tear or rupture of right shoulder, not specified as traumatic: Secondary | ICD-10-CM

## 2017-08-01 DIAGNOSIS — I712 Thoracic aortic aneurysm, without rupture: Secondary | ICD-10-CM | POA: Insufficient documentation

## 2017-08-01 DIAGNOSIS — I7121 Aneurysm of the ascending aorta, without rupture: Secondary | ICD-10-CM | POA: Insufficient documentation

## 2017-08-07 ENCOUNTER — Ambulatory Visit
Admission: RE | Admit: 2017-08-07 | Discharge: 2017-08-07 | Disposition: A | Discharge status: Home / Self Care | Payer: Medicare Other | Source: Ambulatory Visit | Attending: Orthopedic Surgery | Admitting: Orthopedic Surgery

## 2017-08-07 DIAGNOSIS — M75121 Complete rotator cuff tear or rupture of right shoulder, not specified as traumatic: Secondary | ICD-10-CM | POA: Diagnosis not present

## 2017-08-07 DIAGNOSIS — M75101 Unspecified rotator cuff tear or rupture of right shoulder, not specified as traumatic: Secondary | ICD-10-CM

## 2017-08-07 DIAGNOSIS — M19011 Primary osteoarthritis, right shoulder: Secondary | ICD-10-CM | POA: Insufficient documentation

## 2017-08-07 DIAGNOSIS — M6250 Muscle wasting and atrophy, not elsewhere classified, unspecified site: Secondary | ICD-10-CM | POA: Insufficient documentation

## 2017-08-19 DIAGNOSIS — M7581 Other shoulder lesions, right shoulder: Secondary | ICD-10-CM | POA: Insufficient documentation

## 2017-08-19 DIAGNOSIS — S46011A Strain of muscle(s) and tendon(s) of the rotator cuff of right shoulder, initial encounter: Secondary | ICD-10-CM | POA: Insufficient documentation

## 2017-08-20 ENCOUNTER — Ambulatory Visit: Payer: Medicare Other | Admitting: Anesthesiology

## 2017-08-20 ENCOUNTER — Encounter: Payer: Self-pay | Admitting: Emergency Medicine

## 2017-08-20 ENCOUNTER — Other Ambulatory Visit: Payer: Self-pay

## 2017-08-20 ENCOUNTER — Ambulatory Visit
Admission: RE | Admit: 2017-08-20 | Discharge: 2017-08-20 | Disposition: A | Discharge status: Home / Self Care | Payer: Medicare Other | Source: Ambulatory Visit | Attending: Surgery | Admitting: Surgery

## 2017-08-20 ENCOUNTER — Encounter
Admission: RE | Disposition: A | Discharge status: Home / Self Care | Payer: Self-pay | Source: Ambulatory Visit | Attending: Surgery

## 2017-08-20 DIAGNOSIS — I252 Old myocardial infarction: Secondary | ICD-10-CM | POA: Diagnosis not present

## 2017-08-20 DIAGNOSIS — F419 Anxiety disorder, unspecified: Secondary | ICD-10-CM | POA: Insufficient documentation

## 2017-08-20 DIAGNOSIS — M48061 Spinal stenosis, lumbar region without neurogenic claudication: Secondary | ICD-10-CM | POA: Insufficient documentation

## 2017-08-20 DIAGNOSIS — I714 Abdominal aortic aneurysm, without rupture: Secondary | ICD-10-CM | POA: Insufficient documentation

## 2017-08-20 DIAGNOSIS — G2581 Restless legs syndrome: Secondary | ICD-10-CM | POA: Insufficient documentation

## 2017-08-20 DIAGNOSIS — S46011A Strain of muscle(s) and tendon(s) of the rotator cuff of right shoulder, initial encounter: Secondary | ICD-10-CM | POA: Insufficient documentation

## 2017-08-20 DIAGNOSIS — I10 Essential (primary) hypertension: Secondary | ICD-10-CM | POA: Insufficient documentation

## 2017-08-20 DIAGNOSIS — W19XXXA Unspecified fall, initial encounter: Secondary | ICD-10-CM | POA: Diagnosis not present

## 2017-08-20 DIAGNOSIS — M1990 Unspecified osteoarthritis, unspecified site: Secondary | ICD-10-CM | POA: Insufficient documentation

## 2017-08-20 DIAGNOSIS — Z8673 Personal history of transient ischemic attack (TIA), and cerebral infarction without residual deficits: Secondary | ICD-10-CM | POA: Insufficient documentation

## 2017-08-20 DIAGNOSIS — Z7982 Long term (current) use of aspirin: Secondary | ICD-10-CM | POA: Diagnosis not present

## 2017-08-20 DIAGNOSIS — K589 Irritable bowel syndrome without diarrhea: Secondary | ICD-10-CM | POA: Insufficient documentation

## 2017-08-20 DIAGNOSIS — J449 Chronic obstructive pulmonary disease, unspecified: Secondary | ICD-10-CM | POA: Diagnosis not present

## 2017-08-20 DIAGNOSIS — Z8249 Family history of ischemic heart disease and other diseases of the circulatory system: Secondary | ICD-10-CM | POA: Insufficient documentation

## 2017-08-20 DIAGNOSIS — Z880 Allergy status to penicillin: Secondary | ICD-10-CM | POA: Insufficient documentation

## 2017-08-20 DIAGNOSIS — Z884 Allergy status to anesthetic agent status: Secondary | ICD-10-CM | POA: Diagnosis not present

## 2017-08-20 DIAGNOSIS — I2510 Atherosclerotic heart disease of native coronary artery without angina pectoris: Secondary | ICD-10-CM | POA: Insufficient documentation

## 2017-08-20 DIAGNOSIS — Z79899 Other long term (current) drug therapy: Secondary | ICD-10-CM | POA: Insufficient documentation

## 2017-08-20 DIAGNOSIS — F17210 Nicotine dependence, cigarettes, uncomplicated: Secondary | ICD-10-CM | POA: Diagnosis not present

## 2017-08-20 DIAGNOSIS — F329 Major depressive disorder, single episode, unspecified: Secondary | ICD-10-CM | POA: Insufficient documentation

## 2017-08-20 DIAGNOSIS — M65811 Other synovitis and tenosynovitis, right shoulder: Secondary | ICD-10-CM | POA: Insufficient documentation

## 2017-08-20 DIAGNOSIS — I739 Peripheral vascular disease, unspecified: Secondary | ICD-10-CM | POA: Diagnosis not present

## 2017-08-20 DIAGNOSIS — K219 Gastro-esophageal reflux disease without esophagitis: Secondary | ICD-10-CM | POA: Diagnosis not present

## 2017-08-20 HISTORY — PX: SHOULDER ARTHROSCOPY WITH OPEN ROTATOR CUFF REPAIR: SHX6092

## 2017-08-20 SURGERY — ARTHROSCOPY, SHOULDER WITH REPAIR, ROTATOR CUFF, OPEN
Anesthesia: General | Laterality: Right

## 2017-08-20 MED ORDER — LIDOCAINE HCL (PF) 1 % IJ SOLN
INTRAMUSCULAR | Status: AC
  Filled 2017-08-20: qty 5

## 2017-08-20 MED ORDER — SODIUM CHLORIDE 0.9 % IV SOLN
INTRAVENOUS | Status: DC | PRN
  Administered 2017-08-20: 50 ug/min via INTRAVENOUS

## 2017-08-20 MED ORDER — FAMOTIDINE 20 MG PO TABS
ORAL_TABLET | ORAL | Status: AC
  Filled 2017-08-20: qty 1

## 2017-08-20 MED ORDER — ONDANSETRON HCL 4 MG/2ML IJ SOLN
INTRAMUSCULAR | Status: DC | PRN
  Administered 2017-08-20: 4 mg via INTRAVENOUS

## 2017-08-20 MED ORDER — OXYCODONE HCL 5 MG PO TABS: 5.0000 mg | ORAL_TABLET | Freq: Four times a day (QID) | ORAL | 0 refills | Status: DC | PRN

## 2017-08-20 MED ORDER — SUGAMMADEX SODIUM 500 MG/5ML IV SOLN
INTRAVENOUS | Status: AC
  Filled 2017-08-20: qty 5

## 2017-08-20 MED ORDER — ROCURONIUM BROMIDE 50 MG/5ML IV SOLN
INTRAVENOUS | Status: AC
  Filled 2017-08-20: qty 1

## 2017-08-20 MED ORDER — BUPIVACAINE-EPINEPHRINE (PF) 0.5% -1:200000 IJ SOLN
INTRAMUSCULAR | Status: AC
  Filled 2017-08-20: qty 30

## 2017-08-20 MED ORDER — EPHEDRINE SULFATE 50 MG/ML IJ SOLN
INTRAMUSCULAR | Status: AC
  Filled 2017-08-20: qty 1

## 2017-08-20 MED ORDER — LIDOCAINE HCL (CARDIAC) PF 100 MG/5ML IV SOSY
PREFILLED_SYRINGE | INTRAVENOUS | Status: DC | PRN
  Administered 2017-08-20: 100 mg via INTRAVENOUS

## 2017-08-20 MED ORDER — MIDAZOLAM HCL 2 MG/2ML IJ SOLN
INTRAMUSCULAR | Status: AC
  Administered 2017-08-20: 1 mg via INTRAVENOUS
  Filled 2017-08-20: qty 2

## 2017-08-20 MED ORDER — BUPIVACAINE-EPINEPHRINE 0.5% -1:200000 IJ SOLN
INTRAMUSCULAR | Status: DC | PRN
  Administered 2017-08-20: 30 mL

## 2017-08-20 MED ORDER — BUPIVACAINE LIPOSOME 1.3 % IJ SUSP
INTRAMUSCULAR | Status: DC | PRN
  Administered 2017-08-20: 20 mL via PERINEURAL

## 2017-08-20 MED ORDER — LACTATED RINGERS IV SOLN
INTRAVENOUS | Status: DC
  Administered 2017-08-20: 13:00:00 via INTRAVENOUS

## 2017-08-20 MED ORDER — POTASSIUM CHLORIDE IN NACL 20-0.9 MEQ/L-% IV SOLN: INTRAVENOUS | Status: DC

## 2017-08-20 MED ORDER — PROPOFOL 500 MG/50ML IV EMUL
INTRAVENOUS | Status: AC
  Filled 2017-08-20: qty 50

## 2017-08-20 MED ORDER — MIDAZOLAM HCL 2 MG/2ML IJ SOLN
1.0000 mg | Freq: Once | INTRAMUSCULAR | Status: AC
  Administered 2017-08-20: 1 mg via INTRAVENOUS

## 2017-08-20 MED ORDER — SUCCINYLCHOLINE CHLORIDE 20 MG/ML IJ SOLN
INTRAMUSCULAR | Status: AC
  Filled 2017-08-20: qty 1

## 2017-08-20 MED ORDER — CLINDAMYCIN PHOSPHATE 900 MG/50ML IV SOLN
900.0000 mg | Freq: Once | INTRAVENOUS | Status: AC
  Administered 2017-08-20: 900 mg via INTRAVENOUS

## 2017-08-20 MED ORDER — ONDANSETRON HCL 4 MG/2ML IJ SOLN: 4.0000 mg | Freq: Four times a day (QID) | INTRAMUSCULAR | Status: DC | PRN

## 2017-08-20 MED ORDER — BUPIVACAINE HCL (PF) 0.5 % IJ SOLN
INTRAMUSCULAR | Status: AC
  Filled 2017-08-20: qty 10

## 2017-08-20 MED ORDER — DEXAMETHASONE SODIUM PHOSPHATE 10 MG/ML IJ SOLN
INTRAMUSCULAR | Status: AC
  Filled 2017-08-20: qty 1

## 2017-08-20 MED ORDER — OXYCODONE HCL 5 MG PO TABS: 5.0000 mg | ORAL_TABLET | ORAL | Status: DC | PRN

## 2017-08-20 MED ORDER — METOCLOPRAMIDE HCL 10 MG PO TABS: 5.0000 mg | ORAL_TABLET | Freq: Three times a day (TID) | ORAL | Status: DC | PRN

## 2017-08-20 MED ORDER — CLINDAMYCIN PHOSPHATE 900 MG/50ML IV SOLN
INTRAVENOUS | Status: AC
  Filled 2017-08-20: qty 50

## 2017-08-20 MED ORDER — FENTANYL CITRATE (PF) 100 MCG/2ML IJ SOLN
INTRAMUSCULAR | Status: DC | PRN
  Administered 2017-08-20 (×2): 50 ug via INTRAVENOUS

## 2017-08-20 MED ORDER — FENTANYL CITRATE (PF) 100 MCG/2ML IJ SOLN
INTRAMUSCULAR | Status: AC
  Administered 2017-08-20: 50 ug via INTRAVENOUS
  Filled 2017-08-20: qty 2

## 2017-08-20 MED ORDER — FENTANYL CITRATE (PF) 100 MCG/2ML IJ SOLN
50.0000 ug | Freq: Once | INTRAMUSCULAR | Status: AC
  Administered 2017-08-20: 50 ug via INTRAVENOUS

## 2017-08-20 MED ORDER — SUCCINYLCHOLINE CHLORIDE 20 MG/ML IJ SOLN
INTRAMUSCULAR | Status: DC | PRN
  Administered 2017-08-20: 120 mg via INTRAVENOUS

## 2017-08-20 MED ORDER — PROMETHAZINE HCL 25 MG/ML IJ SOLN
6.2500 mg | INTRAMUSCULAR | Status: DC | PRN
Start: 2017-08-20 — End: 2017-08-20

## 2017-08-20 MED ORDER — BUPIVACAINE HCL (PF) 0.5 % IJ SOLN
INTRAMUSCULAR | Status: DC | PRN
  Administered 2017-08-20: 10 mL via PERINEURAL

## 2017-08-20 MED ORDER — FENTANYL CITRATE (PF) 100 MCG/2ML IJ SOLN: 25.0000 ug | INTRAMUSCULAR | Status: DC | PRN

## 2017-08-20 MED ORDER — BUPIVACAINE LIPOSOME 1.3 % IJ SUSP
INTRAMUSCULAR | Status: AC
  Filled 2017-08-20: qty 20

## 2017-08-20 MED ORDER — ROCURONIUM BROMIDE 100 MG/10ML IV SOLN
INTRAVENOUS | Status: DC | PRN
  Administered 2017-08-20: 25 mg via INTRAVENOUS
  Administered 2017-08-20: 5 mg via INTRAVENOUS
  Administered 2017-08-20: 20 mg via INTRAVENOUS

## 2017-08-20 MED ORDER — FAMOTIDINE 20 MG PO TABS
20.0000 mg | ORAL_TABLET | Freq: Once | ORAL | Status: AC
  Administered 2017-08-20: 20 mg via ORAL

## 2017-08-20 MED ORDER — ONDANSETRON HCL 4 MG/2ML IJ SOLN
INTRAMUSCULAR | Status: AC
  Filled 2017-08-20: qty 2

## 2017-08-20 MED ORDER — PROPOFOL 10 MG/ML IV BOLUS
INTRAVENOUS | Status: DC | PRN
  Administered 2017-08-20: 170 mg via INTRAVENOUS

## 2017-08-20 MED ORDER — LIDOCAINE HCL (PF) 1 % IJ SOLN
INTRAMUSCULAR | Status: DC | PRN
  Administered 2017-08-20: 4 mL via SUBCUTANEOUS

## 2017-08-20 MED ORDER — SUGAMMADEX SODIUM 200 MG/2ML IV SOLN
INTRAVENOUS | Status: DC | PRN
  Administered 2017-08-20: 250 mg via INTRAVENOUS

## 2017-08-20 MED ORDER — FENTANYL CITRATE (PF) 100 MCG/2ML IJ SOLN
INTRAMUSCULAR | Status: AC
  Filled 2017-08-20: qty 2

## 2017-08-20 MED ORDER — DEXAMETHASONE SODIUM PHOSPHATE 10 MG/ML IJ SOLN
INTRAMUSCULAR | Status: DC | PRN
  Administered 2017-08-20: 10 mg via INTRAVENOUS

## 2017-08-20 MED ORDER — EPINEPHRINE PF 1 MG/ML IJ SOLN
INTRAMUSCULAR | Status: AC
  Filled 2017-08-20: qty 2

## 2017-08-20 MED ORDER — LIDOCAINE HCL (PF) 2 % IJ SOLN
INTRAMUSCULAR | Status: AC
  Filled 2017-08-20: qty 10

## 2017-08-20 MED ORDER — ONDANSETRON HCL 4 MG PO TABS: 4.0000 mg | ORAL_TABLET | Freq: Four times a day (QID) | ORAL | Status: DC | PRN

## 2017-08-20 MED ORDER — EPHEDRINE SULFATE 50 MG/ML IJ SOLN
INTRAMUSCULAR | Status: DC | PRN
  Administered 2017-08-20: 5 mg via INTRAVENOUS

## 2017-08-20 MED ORDER — METOCLOPRAMIDE HCL 5 MG/ML IJ SOLN: 5.0000 mg | Freq: Three times a day (TID) | INTRAMUSCULAR | Status: DC | PRN

## 2017-08-20 SURGICAL SUPPLY — 42 items
ANCHOR JUGGERKNOT WTAP NDL 2.9 (Anchor) ×10 IMPLANT
ANCHOR SUT QUATTRO KNTLS 4.5 (Anchor) ×6 IMPLANT
BIT DRILL JUGRKNT W/NDL BIT2.9 (DRILL) IMPLANT
BLADE FULL RADIUS 3.5 (BLADE) ×3 IMPLANT
BUR ACROMIONIZER 4.0 (BURR) ×3 IMPLANT
CANNULA SHAVER 8MMX76MM (CANNULA) ×3 IMPLANT
CHLORAPREP W/TINT 26ML (MISCELLANEOUS) ×3 IMPLANT
COVER MAYO STAND STRL (DRAPES) ×3 IMPLANT
DRAPE IMP U-DRAPE 54X76 (DRAPES) ×6 IMPLANT
DRILL JUGGERKNOT W/NDL BIT 2.9 (DRILL) ×6
ELECT REM PT RETURN 9FT ADLT (ELECTROSURGICAL) ×3
ELECTRODE REM PT RTRN 9FT ADLT (ELECTROSURGICAL) ×1 IMPLANT
GAUZE PETRO XEROFOAM 1X8 (MISCELLANEOUS) ×3 IMPLANT
GAUZE SPONGE 4X4 12PLY STRL (GAUZE/BANDAGES/DRESSINGS) ×3 IMPLANT
GLOVE BIO SURGEON STRL SZ7.5 (GLOVE) ×6 IMPLANT
GLOVE BIO SURGEON STRL SZ8 (GLOVE) ×6 IMPLANT
GLOVE BIOGEL PI IND STRL 8 (GLOVE) ×1 IMPLANT
GLOVE BIOGEL PI INDICATOR 8 (GLOVE) ×2
GLOVE INDICATOR 8.0 STRL GRN (GLOVE) ×3 IMPLANT
GOWN STRL REUS W/ TWL LRG LVL3 (GOWN DISPOSABLE) ×1 IMPLANT
GOWN STRL REUS W/ TWL XL LVL3 (GOWN DISPOSABLE) ×1 IMPLANT
GOWN STRL REUS W/TWL LRG LVL3 (GOWN DISPOSABLE) ×2
GOWN STRL REUS W/TWL XL LVL3 (GOWN DISPOSABLE) ×2
GRASPER SUT 15 45D LOW PRO (SUTURE) IMPLANT
IV LACTATED RINGER IRRG 3000ML (IV SOLUTION) ×4
IV LR IRRIG 3000ML ARTHROMATIC (IV SOLUTION) ×2 IMPLANT
MANIFOLD NEPTUNE II (INSTRUMENTS) ×3 IMPLANT
MASK FACE SPIDER DISP (MASK) ×3 IMPLANT
MAT BLUE FLOOR 46X72 FLO (MISCELLANEOUS) ×3 IMPLANT
PACK ARTHROSCOPY SHOULDER (MISCELLANEOUS) ×3 IMPLANT
SLING ARM LRG DEEP (SOFTGOODS) ×1 IMPLANT
SLING ULTRA II LG (MISCELLANEOUS) ×3 IMPLANT
STAPLER SKIN PROX 35W (STAPLE) ×3 IMPLANT
STRAP SAFETY 5IN WIDE (MISCELLANEOUS) ×3 IMPLANT
SUT ETHIBOND 0 MO6 C/R (SUTURE) ×3 IMPLANT
SUT VIC AB 2-0 CT1 27 (SUTURE) ×4
SUT VIC AB 2-0 CT1 TAPERPNT 27 (SUTURE) ×2 IMPLANT
TAPE MICROFOAM 4IN (TAPE) ×3 IMPLANT
TUBING ARTHRO INFLOW-ONLY STRL (TUBING) ×3 IMPLANT
TUBING CONNECTING 10 (TUBING) ×2 IMPLANT
TUBING CONNECTING 10' (TUBING) ×1
WAND HAND CNTRL MULTIVAC 90 (MISCELLANEOUS) ×3 IMPLANT

## 2017-08-20 NOTE — Discharge Instructions (Addendum)
AMBULATORY SURGERY  DISCHARGE INSTRUCTIONS   1) The drugs that you were given will stay in your system until tomorrow so for the next 24 hours you should not:  A) Drive an automobile B) Make any legal decisions C) Drink any alcoholic beverage   2) You may resume regular meals tomorrow.  Today it is better to start with liquids and gradually work up to solid foods.  You may eat anything you prefer, but it is better to start with liquids, then soup and crackers, and gradually work up to solid foods.   3) Please notify your doctor immediately if you have any unusual bleeding, trouble breathing, redness and pain at the surgery site, drainage, fever, or pain not relieved by medication.    4) Additional Instructions:        Please contact your physician with any problems or Same Day Surgery at [phone], Monday through Friday 6 am to 4 pm, or [place] [facility] number at [phone].Orthopedic discharge instructions: Keep dressing dry and intact.  May shower after dressing changed on post-op day #4 (Saturday).  Cover staples with Band-Aids after drying off. Apply ice frequently to shoulder. Take ibuprofen 600-800 mg TID with meals for 7-10 days, then as necessary. Take oxycodone as prescribed when needed.  May supplement with ES Tylenol if necessary. Keep shoulder immobilizer on at all times except may remove for bathing purposes. Follow-up in 10-14 days or as scheduled.

## 2017-08-20 NOTE — Anesthesia Post-op Follow-up Note (Signed)
Anesthesia QCDR form completed.        

## 2017-08-20 NOTE — H&P (Signed)
Paper H&P to be scanned into permanent record. H&P reviewed and patient re-examined. No changes. 

## 2017-08-20 NOTE — Anesthesia Preprocedure Evaluation (Addendum)
Anesthesia Evaluation  Patient identified by MRN, date of birth, ID band Patient awake    Reviewed: Allergy & Precautions, NPO status , Patient's Chart, lab work & pertinent test results  History of Anesthesia Complications Negative for: history of anesthetic complications  Airway Mallampati: II  TM Distance: >3 FB Neck ROM: Full    Dental  (+) Missing, Loose, Poor Dentition,    Pulmonary shortness of breath and with exertion, asthma , neg sleep apnea, COPD,  COPD inhaler, neg recent URI, Current Smoker,    breath sounds clear to auscultation- rhonchi (-) wheezing      Cardiovascular hypertension, Pt. on medications + CAD, + Past MI and + Peripheral Vascular Disease  (-) Cardiac Stents and (-) CABG (-) dysrhythmias (-) Valvular Problems/Murmurs Rhythm:Regular Rate:Normal - Systolic murmurs and - Diastolic murmurs    Neuro/Psych neg Seizures PSYCHIATRIC DISORDERS Anxiety Depression    GI/Hepatic Neg liver ROS, GERD  ,  Endo/Other  negative endocrine ROSneg diabetes  Renal/GU Renal disease     Musculoskeletal  (+) Arthritis ,   Abdominal (+) - obese,   Peds  Hematology negative hematology ROS (+)   Anesthesia Other Findings Past Medical History: No date: AAA (abdominal aortic aneurysm) without ruptur* No date: Anxiety No date: Arthritis No date: Asthma 07/16/2014: Benign hypertension 07/16/2014: Chest pain on exertion No date: Chronic pain syndrome 02/08/2012: Coccydynia No date: COPD (chronic obstructive pulmonary disease) (*     Comment: patient smokes No date: Coronary artery disease No date: Depression 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, thoracic No date: IBS (irritable bowel syndrome) 2010: Myocardial infarction No date: Restless leg syndrome No date: Sciatica No date: Spinal stenosis of lumbar region 2015: Stroke ([place])     Comment: no residual symptoms   Reproductive/Obstetrics                             Anesthesia Physical  Anesthesia Plan  ASA: III  Anesthesia Plan: General   Post-op Pain Management:  Regional for Post-op pain   Induction: Intravenous  PONV Risk Score and Plan: 1 and Ondansetron and Dexamethasone  Airway Management Planned: Oral ETT  Additional Equipment:   Intra-op Plan:   Post-operative Plan: Extubation in OR  Informed Consent: I have reviewed the patients History and Physical, chart, labs and discussed the procedure including the risks, benefits and alternatives for the proposed anesthesia with the patient or authorized representative who has indicated his/her understanding and acceptance.   Dental advisory given  Plan Discussed with: CRNA and Anesthesiologist  Anesthesia Plan Comments: (Lidocaine listed as allergy, pt reports that one time after injection at the dentist he required CPR and was taken to the hospital,, however he has had lidocaine at the dentist previous to that episode as well as since that episode without any problems.)        Anesthesia Quick Evaluation

## 2017-08-20 NOTE — Anesthesia Procedure Notes (Signed)
Anesthesia Regional Block: Interscalene brachial plexus block   Pre-Anesthetic Checklist: ,, timeout performed, Correct Patient, Correct Site, Correct Laterality, Correct Procedure, Correct Position, site marked, Risks and benefits discussed,  Surgical consent,  Pre-op evaluation,  At surgeon's request and post-op pain management  Laterality: Right and Upper  Prep: chloraprep       Needles:  Injection technique: Single-shot  Needle Type: Stimiplex     Needle Length: 5cm  Needle Gauge: 22     Additional Needles:   Procedures:,,,, ultrasound used (permanent image in chart),,,,  Narrative:  Start time: 08/20/2017 2:34 PM End time: 08/20/2017 2:37 PM Injection made incrementally with aspirations every 5 mL.  Performed by: Personally  Anesthesiologist: Martha Clan, MD  Additional Notes: Functioning IV was confirmed and monitors were applied.  A 64mm 22ga Stimuplex needle was used. Sterile prep and drape,hand hygiene and sterile gloves were used.  Negative aspiration and negative test dose prior to incremental administration of local anesthetic. The patient tolerated the procedure well.

## 2017-08-20 NOTE — Op Note (Signed)
08/20/2017  5:13 PM  Patient:   Joseph Peters  Pre-Op Diagnosis:   Traumatic massive rotator cuff tear, right shoulder.  Post-Op Diagnosis: Same.  Procedure: Limited arthroscopic debridement, arthroscopic subacromial decompression, and mini-open repair of massive rotator cuff tear, right shoulder.  Anesthesia: General endotracheal with interscalene block placed preoperatively by the anesthesiologist.  Surgeon:   Pascal Lux, MD  Assistant:   Cameron Proud, PA-C  Findings: As above.  The rotator cuff tear involved the superior two thirds of the subscapularis tendon, the entire supraspinatus tendon, and the anterior half of the infraspinatus tendon.  The long head of the biceps tendon was torn and retracted.  The labrum demonstrated some fraying but otherwise was intact.  The articular surfaces of the glenoid and humerus both were in excellent condition.  Complications: None  Fluids:   900 cc  Estimated blood loss: 15 cc  Tourniquet time: None  Drains: None  Closure: Staples   Brief clinical note: The patient is a 62 year old male with a 6 to 8-week history of right shoulder pain and weakness following a fall onto his right shoulder. The patient's symptoms have progressed despite medications, activity modification, etc. The patient's history and examination are consistent with a massive rotator cuff tear. These findings were confirmed by MRI scan. The patient presents at this time for definitive management of these shoulder symptoms.  Procedure: The patient underwent placement of an interscalene block by the anesthesiologist in the preoperative holding area before being brought into the operating room and lain in the supine position. The patient then underwent general endotracheal intubation and anesthesia before being repositioned in the beach chair position using the beach chair positioner. The right shoulder and upper extremity were prepped with  ChloraPrep solution before being draped sterilely. Preoperative antibiotics were administered. A timeout was performed to confirm the proper surgical site before the expected portal sites and incision site were injected with 0.5% Sensorcaine with epinephrine. A posterior portal was created and the glenohumeral joint thoroughly inspected with the findings as described above. An anterior portal was created using an outside-in technique. The labrum and rotator cuff were further probed, again confirming the above-noted findings. The areas of labral fraying and synovitis were debrided back to stable margins using the full-radius resector. The ArthroCare wand was inserted and used to obtain hemostasis as well as to "anneal" the labrum superiorly and anteriorly. The instruments were removed from the joint after suctioning the excess fluid.  The camera was repositioned through the posterior portal into the subacromial space. A separate lateral portal was created using an outside-in technique. The 3.5 mm full-radius resector was introduced and used to perform a subtotal bursectomy. The ArthroCare wand was then inserted and used to remove the periosteal tissue off the undersurface of the anterior third of the acromion as well as to recess the coracoacromial ligament from its attachment along the anterior and lateral margins of the acromion. The 4.0 mm acromionizing bur was introduced and used to complete the decompression by removing the undersurface of the anterior third of the acromion. The full radius resector was reintroduced to remove any residual bony debris before the ArthroCare wand was reintroduced to obtain hemostasis. The instruments were then removed from the subacromial space after suctioning the excess fluid.  An approximately 4-5 cm incision was made over the anterolateral aspect of the shoulder beginning at the anterolateral corner of the acromion and extending distally in line with the bicipital groove.  This incision was carried down through  the subcutaneous tissues to expose the deltoid fascia. The raphae between the anterior and middle thirds was identified and this plane developed to provide access into the subacromial space. Additional bursal tissues were debrided sharply using Metzenbaum scissors. The rotator cuff tear was readily identified. The margins were debrided sharply with a #15 blade and the exposed greater tuberosity roughened with a rongeur. The subscapularis tendon tear was repaired using three Biomet 2.9 mm JuggerKnot anchors. These sutures were then brought back laterally and secured using two [place] QuatroLink anchors to create a two-layer closure. The supraspinatus and infraspinatus tendon tendon tears were repaired using two Biomet 2.9 mm JuggerKnot anchors. These sutures were then brought back laterally and secured using a third [employer] anchor to create a two-layer closure. An apparent watertight closure was obtained.  The wound was copiously irrigated with sterile saline solution before the deltoid raphae was reapproximated using 2-0 Vicryl interrupted sutures. The subcutaneous tissues were closed in two layers using 2-0 Vicryl interrupted sutures before the skin was closed using staples. The portal sites also were closed using staples. A sterile bulky dressing was applied to the shoulder before the arm was placed into a shoulder immobilizer. The patient was then awakened, extubated, and returned to the recovery room in satisfactory condition after tolerating the procedure well.

## 2017-08-20 NOTE — Transfer of Care (Signed)
Immediate Anesthesia Transfer of Care Note  Patient: Joseph Peters  Procedure(s) Performed: SHOULDER ARTHROSCOPY WITH OPEN ROTATOR CUFF REPAIR (Right )  Patient Location: PACU  Anesthesia Type:General  Level of Consciousness: awake  Airway & Oxygen Therapy: Patient connected to face mask oxygen  Post-op Assessment: Post -op Vital signs reviewed and stable  Post vital signs: stable  Last Vitals:  Vitals Value Taken Time  BP 163/88 08/20/2017  5:29 PM  Temp 36.2 C 08/20/2017  5:30 PM  Pulse 83 08/20/2017  5:30 PM  Resp 16 08/20/2017  5:30 PM  SpO2 98 % 08/20/2017  5:30 PM  Vitals shown include unvalidated device data.  Last Pain:  Vitals:   08/20/17 1310  TempSrc: Tympanic  PainSc: 8          Complications: No apparent anesthesia complications

## 2017-08-20 NOTE — Anesthesia Procedure Notes (Addendum)
Procedure Name: Intubation Date/Time: 08/20/2017 3:22 PM Performed by: Aline Brochure, CRNA Pre-anesthesia Checklist: Patient identified, Emergency Drugs available, Suction available and Patient being monitored Patient Re-evaluated:Patient Re-evaluated prior to induction Oxygen Delivery Method: Circle system utilized Preoxygenation: Pre-oxygenation with 100% oxygen Induction Type: IV induction Ventilation: Oral airway inserted - appropriate to patient size and Two handed mask ventilation required Laryngoscope Size: Mac and 4 Grade View: Grade I Tube type: Oral Tube size: 7.5 mm Number of attempts: 1 Airway Equipment and Method: Stylet Placement Confirmation: ETT inserted through vocal cords under direct vision,  positive ETCO2 and breath sounds checked- equal and bilateral Secured at: 22 cm Tube secured with: Tape Dental Injury: Teeth and Oropharynx as per pre-operative assessment

## 2017-08-20 NOTE — Anesthesia Postprocedure Evaluation (Signed)
Anesthesia Post Note  Patient: Joseph Peters  Procedure(s) Performed: SHOULDER ARTHROSCOPY WITH OPEN ROTATOR CUFF REPAIR (Right )  Patient location during evaluation: PACU Anesthesia Type: General Level of consciousness: awake and alert Pain management: pain level controlled Vital Signs Assessment: post-procedure vital signs reviewed and stable Respiratory status: spontaneous breathing and respiratory function stable Cardiovascular status: stable Anesthetic complications: no     Last Vitals:  Vitals:   08/20/17 1814 08/20/17 1817  BP: (!) 154/89 (!) 154/89  Pulse: 64 72  Resp: 17 18  Temp: (!) 36 C (!) 36 C  SpO2: 94% 95%    Last Pain:  Vitals:   08/20/17 1817  TempSrc: Temporal  PainSc: 0-No pain                 KEPHART,WILLIAM K

## 2017-08-21 ENCOUNTER — Encounter: Payer: Self-pay | Admitting: Surgery

## 2017-09-24 ENCOUNTER — Other Ambulatory Visit: Payer: Self-pay | Admitting: Family Medicine

## 2017-09-24 DIAGNOSIS — N401 Enlarged prostate with lower urinary tract symptoms: Secondary | ICD-10-CM

## 2017-09-24 MED ORDER — TAMSULOSIN HCL 0.4 MG PO CAPS: 0.4000 mg | ORAL_CAPSULE | Freq: Every day | ORAL | 1 refills | Status: DC

## 2017-12-19 ENCOUNTER — Telehealth: Payer: Self-pay | Admitting: Urology

## 2017-12-19 DIAGNOSIS — N401 Enlarged prostate with lower urinary tract symptoms: Secondary | ICD-10-CM

## 2017-12-19 MED ORDER — TAMSULOSIN HCL 0.4 MG PO CAPS: 0.4000 mg | ORAL_CAPSULE | Freq: Every day | ORAL | 1 refills | Status: DC

## 2017-12-19 NOTE — Telephone Encounter (Signed)
Rx sent to pharmacy   

## 2017-12-19 NOTE — Telephone Encounter (Signed)
Pt is doing well and would like to wait until 1/8 to see Dr Junious Silk.  He will need a refill of Tamsulosin (3 month supply) sent to [facility] in [place] to last til his upcoming appt

## 2017-12-23 ENCOUNTER — Ambulatory Visit: Payer: Medicare Other

## 2018-01-13 ENCOUNTER — Other Ambulatory Visit (INDEPENDENT_AMBULATORY_CARE_PROVIDER_SITE_OTHER): Payer: Self-pay | Admitting: *Deleted

## 2018-01-29 ENCOUNTER — Other Ambulatory Visit (INDEPENDENT_AMBULATORY_CARE_PROVIDER_SITE_OTHER): Payer: Self-pay | Admitting: Vascular Surgery

## 2018-01-29 DIAGNOSIS — I774 Celiac artery compression syndrome: Secondary | ICD-10-CM

## 2018-01-29 DIAGNOSIS — I771 Stricture of artery: Secondary | ICD-10-CM

## 2018-01-29 DIAGNOSIS — I701 Atherosclerosis of renal artery: Secondary | ICD-10-CM

## 2018-01-30 ENCOUNTER — Encounter (INDEPENDENT_AMBULATORY_CARE_PROVIDER_SITE_OTHER): Payer: Medicare Other

## 2018-01-30 ENCOUNTER — Ambulatory Visit (INDEPENDENT_AMBULATORY_CARE_PROVIDER_SITE_OTHER): Payer: Medicare Other

## 2018-01-30 ENCOUNTER — Ambulatory Visit (INDEPENDENT_AMBULATORY_CARE_PROVIDER_SITE_OTHER): Payer: Medicare Other | Admitting: Vascular Surgery

## 2018-01-30 ENCOUNTER — Encounter (INDEPENDENT_AMBULATORY_CARE_PROVIDER_SITE_OTHER): Payer: Self-pay | Admitting: Vascular Surgery

## 2018-01-30 VITALS — BP 159/89 | HR 66 | Resp 16 | Ht 65.0 in | Wt 131.0 lb

## 2018-01-30 DIAGNOSIS — I774 Celiac artery compression syndrome: Secondary | ICD-10-CM

## 2018-01-30 DIAGNOSIS — I714 Abdominal aortic aneurysm, without rupture, unspecified: Secondary | ICD-10-CM

## 2018-01-30 DIAGNOSIS — J441 Chronic obstructive pulmonary disease with (acute) exacerbation: Secondary | ICD-10-CM | POA: Diagnosis not present

## 2018-01-30 DIAGNOSIS — I701 Atherosclerosis of renal artery: Secondary | ICD-10-CM

## 2018-01-30 DIAGNOSIS — E782 Mixed hyperlipidemia: Secondary | ICD-10-CM

## 2018-01-30 DIAGNOSIS — I10 Essential (primary) hypertension: Secondary | ICD-10-CM

## 2018-01-30 DIAGNOSIS — E785 Hyperlipidemia, unspecified: Secondary | ICD-10-CM | POA: Insufficient documentation

## 2018-01-30 DIAGNOSIS — I771 Stricture of artery: Secondary | ICD-10-CM

## 2018-01-30 NOTE — Progress Notes (Signed)
MRN : [id number]  Joseph Peters is a 62 y.o. (11/02/1955) male who presents with chief complaint of  Chief Complaint  Patient presents with  . Follow-up    utrasound follow up  .  History of Present Illness:   The patient returns to the office for surveillance of a known renal artery stenosis and a known  abdominal aortic aneurysm. Patient denies abdominal pain or back pain, no other abdominal complaints. No changes suggesting embolic episodes.   There have been no interval changes in the patient's overall health care since his last visit. Although he notes his COPD is getting worse.  No abdominal pain or weight loss.  His BP has been stable.  Patient denies amaurosis fugax or TIA symptoms. There is no history of claudication or rest pain symptoms of the lower extremities. The patient denies angina or shortness of breath.   Duplex ultrasound of the aorta and renal arteries shows <60% bilateral renal artery stenosis and a distal AAA 3.9 cm.  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 albuterol (PROVENTIL HFA;VENTOLIN HFA) 108 (90 Base) MCG/ACT inhaler Inhale 2 puffs into the lungs every 4 (four) hours as needed for wheezing or shortness of breath.   Marland Kitchen albuterol (PROVENTIL) (2.5 MG/3ML) 0.083% nebulizer solution Inhale 2.5 mg into the lungs every 6 (six) hours as needed for wheezing or shortness of breath.   Marland Kitchen aspirin EC 81 MG tablet Take 81 mg by mouth at bedtime.   . diazepam (VALIUM) 5 MG tablet Take 5 mg by mouth 3 times weekly if needed for restless leg  . finasteride (PROSCAR) 5 MG tablet Take 1 tablet (5 mg total) by mouth daily.  Marland Kitchen FLUoxetine (PROZAC) 20 MG capsule Take 20 mg by mouth 2 (two) times daily.   Marland Kitchen gabapentin (NEURONTIN) 300 MG capsule Take 900 mg by mouth at bedtime.   Marland Kitchen losartan (COZAAR) 25 MG tablet Take 25 mg by mouth daily.   . montelukast (SINGULAIR) 10 MG tablet Take 10 mg by mouth at bedtime.  . nitroGLYCERIN  (NITROSTAT) 0.4 MG SL tablet Place 0.4 mg under the tongue every 5 (five) minutes as needed for chest pain.   Marland Kitchen oxyCODONE (ROXICODONE) 5 MG immediate release tablet Take 1-2 tablets (5-10 mg total) by mouth every 6 (six) hours as needed for severe pain.  . Oxycodone HCl 10 MG TABS Take 10 mg by mouth 4 times daily  . simvastatin (ZOCOR) 40 MG tablet Take 40 mg by mouth at bedtime.   . tamsulosin (FLOMAX) 0.4 MG CAPS capsule Take 1 capsule (0.4 mg total) by mouth daily after supper.  . TRELEGY ELLIPTA 100-62.5-25 MCG/INH AEPB Inhale 1 puff into the lungs daily.    Past Medical History:  Diagnosis Date  . AAA (abdominal aortic aneurysm) without rupture ([place])   . Abdominal aneurysm ([place]) 2015   being followed but not large enough to surgically treat  . Anxiety   . Arthritis   . Asthma   . Benign hypertension 07/16/2014  . Chest pain on exertion 07/16/2014  . Chronic pain syndrome   . Coccydynia 02/08/2012  . COPD (chronic obstructive pulmonary disease) ([place])    patient smokes  . Coronary artery disease   . 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 Depression   . 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 IBS (irritable bowel syndrome)   . Myocardial infarction ([place]) 2010  . Restless  leg syndrome   . Sciatica   . Spinal stenosis of lumbar region   . Stroke [facility]) 2015   no residual symptoms    Past Surgical History:  Procedure Laterality Date  . CARDIAC CATHETERIZATION  2012  . COLONOSCOPY WITH PROPOFOL N/A 11/14/2016   Procedure: COLONOSCOPY WITH PROPOFOL;  Surgeon: Manya Silvas, MD;  Location: [facility] ENDOSCOPY;  Service: Endoscopy;  Laterality: N/A;  . ESOPHAGOGASTRODUODENOSCOPY (EGD) WITH PROPOFOL N/A 11/14/2016   Procedure: ESOPHAGOGASTRODUODENOSCOPY (EGD) WITH PROPOFOL;  Surgeon: Manya Silvas, MD;  Location[facility] ENDOSCOPY;  Service: Endoscopy;  Laterality: N/A;  . KNEE SURGERY     right  . SHOULDER ARTHROSCOPY WITH OPEN ROTATOR CUFF REPAIR  Right 08/20/2017   Procedure: SHOULDER ARTHROSCOPY WITH OPEN ROTATOR CUFF REPAIR;  Surgeon: Corky Mull, MD;  Location: [facility] ORS;  Service: Orthopedics;  Laterality: Right;  . SHOULDER ARTHROSCOPY WITH SUBACROMIAL DECOMPRESSION, ROTATOR CUFF REPAIR AND BICEP TENDON REPAIR Left 05/10/2016   Procedure: SHOULDER ARTHROSCOPY WITH DEBTRIDEMENT, DECOMPRESSION, REPAIR OF MASSIVE ROTATOR CUFF TEAR AND BICEP TENODESIS;  Surgeon: Corky Mull, MD;  Location: [facility] ORS;  Service: Orthopedics;  Laterality: Left;  Massive cuff tear    Social History Social History   Tobacco Use  . Smoking status: Current Every Day Smoker    Packs/day: 0.50    Years: 30.00    Pack years: 15.00    Types: Cigarettes  . Smokeless tobacco: Former Systems developer    Quit date: 02/26/1998  Substance Use Topics  . Alcohol use: Yes    Alcohol/week: 0.0 standard drinks    Comment: beer daily  . Drug use: No    Family History Family History  Problem Relation Age of Onset  . Cancer Mother   . COPD Father 52       black lung    Allergies  Allergen Reactions  . Lidocaine Anaphylaxis  . Penicillins Rash and Other (See Comments)    Has patient had a PCN reaction causing immediate rash, facial/tongue/throat swelling, SOB or lightheadedness with hypotension: No Has patient had a PCN reaction causing severe rash involving mucus membranes or skin necrosis: No Has patient had a PCN reaction that required hospitalization No Has patient had a PCN reaction occurring within the last 10 years: No If all of the above answers are "NO", then may proceed with Cephalosporin use.      REVIEW OF SYSTEMS (Negative unless checked)  Constitutional: [] Weight loss  [] Fever  [] Chills Cardiac: [] Chest pain   [] Chest pressure   [] Palpitations   [] Shortness of breath when laying flat   [] Shortness of breath with exertion. Vascular:  [] Pain in legs with walking   [] Pain in legs at rest  [] History of DVT   [] Phlebitis   [] Swelling in legs   [] Varicose  veins   [] Non-healing ulcers Pulmonary:   [] Uses home oxygen   [] Productive cough   [] Hemoptysis   [] Wheeze  [x] COPD   [] Asthma Neurologic:  [] Dizziness   [] Seizures   [] History of stroke   [] History of TIA  [] Aphasia   [] Vissual changes   [] Weakness or numbness in arm   [] Weakness or numbness in leg Musculoskeletal:   [] Joint swelling   [] Joint pain   [] Low back pain Hematologic:  [] Easy bruising  [] Easy bleeding   [] Hypercoagulable state   [] Anemic Gastrointestinal:  [] Diarrhea   [] Vomiting  [x] Gastroesophageal reflux/heartburn   [] Difficulty swallowing. Genitourinary:  [] Chronic kidney disease   [] Difficult urination  [] Frequent urination   [] Blood in urine Skin:  [] Rashes   []   Ulcers  Psychological:  [] History of anxiety   []  History of major depression.  Physical Examination  Vitals:   01/30/18 0915  BP: (!) 159/89  Pulse: 66  Resp: 16  Weight: 131 lb (59.4 kg)  Height: 5\' 5"  (1.651 m)   Body mass index is 21.8 kg/m. Gen: WD/WN, NAD Head: [place]/AT, No temporalis wasting.  Ear/Nose/Throat: Hearing grossly intact, nares w/o erythema or drainage Eyes: PER, EOMI, sclera nonicteric.  Neck: Supple, no large masses.   Pulmonary:  Good air movement, no audible wheezing bilaterally, no use of accessory muscles.  Cardiac: RRR, no JVD Vascular:  Vessel Right Left  Radial Palpable Palpable  Brachial Palpable Palpable  Carotid Palpable Palpable  PT Palpable Palpable  DP Palpable Palpable  Gastrointestinal: Non-distended. No guarding/no peritoneal signs.  Musculoskeletal: M/S 5/5 throughout.  No deformity or atrophy.  Neurologic: CN 2-12 intact. Symmetrical.  Speech is fluent. Motor exam as listed above. Psychiatric: Judgment intact, Mood & affect appropriate for pt's clinical situation. Dermatologic: No rashes or ulcers noted.  No changes consistent with cellulitis. Lymph : No lichenification or skin changes of chronic lymphedema.  CBC Lab Results  Component Value Date   WBC 8.8  06/15/2016   HGB 14.5 06/15/2016   HCT 42.3 06/15/2016   MCV 97.4 06/15/2016   PLT 178 06/15/2016    BMET    Component Value Date/Time   NA 138 06/15/2016 1646   NA 138 11/02/2014 1635   NA 139 06/23/2012 1028   K 4.0 06/15/2016 1646   K 4.6 06/23/2012 1028   CL 104 06/15/2016 1646   CL 105 06/23/2012 1028   CO2 24 06/15/2016 1646   CO2 26 06/23/2012 1028   GLUCOSE 87 06/15/2016 1646   GLUCOSE 88 06/23/2012 1028   BUN 10 06/15/2016 1646   BUN 14 11/02/2014 1635   BUN 12 06/23/2012 1028   CREATININE 1.07 06/15/2016 1646   CREATININE 0.84 06/23/2012 1028   CALCIUM 9.0 06/15/2016 1646   CALCIUM 8.8 06/23/2012 1028   GFRNONAA >60 06/15/2016 1646   GFRNONAA >60 06/23/2012 1028   GFRAA >60 06/15/2016 1646   GFRAA >60 06/23/2012 1028   CrCl cannot be calculated (Patient's most recent lab result is older than the maximum 21 days allowed.).  COAG No results found for: INR, PROTIME  Radiology No results found.   Assessment/Plan 1. Renal artery stenosis (HCC) Given patient's arterial disease optimal control of the patient's hypertension is important. BP is acceptable today  The patient's vital signs and noninvasive studies support the renal artery stenosis is not significantly increased when compared to the previous study.  No invasive studies or intervention is indicated at this time.  The patient will continue the current antihypertensive medications, no changes at this time.  The primary medical service will continue aggressive antihypertensive therapy as per the AHA guidelines   - VAS US RENAL ARTERY DUPLEX; Future  2. Abdominal aortic aneurysm (AAA) without rupture ([place]) No surgery or intervention at this time. The patient has an asymptomatic abdominal aortic aneurysm that is less than 4 cm in maximal diameter.  I have discussed the natural history of abdominal aortic aneurysm and the small risk of rupture for aneurysm less than 5 cm in size.  However, as  these small aneurysms tend to enlarge over time, continued surveillance with ultrasound or CT scan is mandatory.  I have also discussed optimizing medical management with hypertension and lipid control and the importance of abstinence from tobacco.  The patient is  also encouraged to exercise a minimum of 30 minutes 4 times a week.  Should the patient develop new onset abdominal or back pain or signs of peripheral embolization they are instructed to seek medical attention immediately and to alert the physician providing care that they have an aneurysm.  The patient voices their understanding. The patient will return in 12 months with an aortic duplex.  - VAS US RENAL ARTERY DUPLEX; Futur  3. Essential hypertension Continue antihypertensive medications as already ordered, these medications have been reviewed and there are no changes at this time.  4. COPD exacerbation ([place]) Continue pulmonary medications and aerosols as already ordered, these medications have been reviewed and there are no changes at this time.  5. Mixed hyperlipidemia Continue statin as ordered and reviewed, no changes at this time     Hortencia Pilar, MD  01/30/2018 11:40 AM

## 2018-02-25 ENCOUNTER — Other Ambulatory Visit: Payer: Self-pay

## 2018-02-25 NOTE — Telephone Encounter (Signed)
ERROR

## 2018-03-05 ENCOUNTER — Ambulatory Visit (INDEPENDENT_AMBULATORY_CARE_PROVIDER_SITE_OTHER): Payer: Medicare Other | Admitting: Urology

## 2018-03-05 ENCOUNTER — Encounter: Payer: Self-pay | Admitting: Urology

## 2018-03-05 VITALS — BP 134/84 | HR 102 | Ht 65.0 in | Wt 132.4 lb

## 2018-03-05 DIAGNOSIS — N401 Enlarged prostate with lower urinary tract symptoms: Secondary | ICD-10-CM | POA: Diagnosis not present

## 2018-03-05 DIAGNOSIS — R3911 Hesitancy of micturition: Secondary | ICD-10-CM | POA: Diagnosis not present

## 2018-03-05 DIAGNOSIS — N138 Other obstructive and reflux uropathy: Secondary | ICD-10-CM | POA: Diagnosis not present

## 2018-03-05 NOTE — Progress Notes (Signed)
03/05/2018 2:00 PM   Joseph Peters Jul 11, 1955 [id number]  Referring provider: Gearldine Shown, DO No address on file  Chief Complaint  Patient presents with  . Benign Prostatic Hypertrophy    HPI:  1 - BPH with Lower Urinary Tract Symptoms / Dysuria- pt with sensation of "peeing glass" when hematuria present. PVR 10/2014 "84mL", DRE 40gm. UA w/o bacteruria at time. Started finasteride 2016. Added tamsulosin in 2017 for hesitancy. Prior PVR 131 ml. PSA usually 0.3 -  0.5.   2 - Gross Hematuria- visible terminal hematuria x several mos. 40PY smoker, still smokes. CT Urogram 10/2014 w/o GU mass / stone. Cysto 10/2014 with significant bilobar prostatic hypertrophy with varices. He was started on finasteride for prostatic bleeding.   3 - Abdominal Aortic Aneurysm- incidental 3.2cm infrarenal AAA by hematuria CT 2016. He is smoker. There is a 70% right renal artery stenosis. There is no amount hemodynamically significant stenosis on the left. He also had COPD and CAD. His Apr 2019 bun was 6 and Cr 1.2.   He continues tamsulosin and finasteride. He's had no gross hematuria or dysuria. Every so often he'll have hesitancy but this resolves. He had an echo for DOE recently.   PMH: Past Medical History:  Diagnosis Date  . AAA (abdominal aortic aneurysm) without rupture ([place])   . Abdominal aneurysm ([place]) 2015   being followed but not large enough to surgically treat  . Anxiety   . Arthritis   . Asthma   . Benign hypertension 07/16/2014  . Chest pain on exertion 07/16/2014  . Chronic pain syndrome   . Coccydynia 02/08/2012  . COPD (chronic obstructive pulmonary disease) ([place])    patient smokes  . Coronary artery disease   . 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 Depression   . 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 IBS (irritable bowel syndrome)   . Myocardial infarction ([place]) 2010  . Restless leg syndrome   . Sciatica   . Spinal  stenosis of lumbar region   . Stroke [facility]) 2015   no residual symptoms    Surgical History: Past Surgical History:  Procedure Laterality Date  . CARDIAC CATHETERIZATION  2012  . COLONOSCOPY WITH PROPOFOL N/A 11/14/2016   Procedure: COLONOSCOPY WITH PROPOFOL;  Surgeon: Manya Silvas, MD;  [facility] ENDOSCOPY;  Service: Endoscopy;  Laterality: N/A;  . ESOPHAGOGASTRODUODENOSCOPY (EGD) WITH PROPOFOL N/A 11/14/2016   Procedure: ESOPHAGOGASTRODUODENOSCOPY (EGD) WITH PROPOFOL;  Surgeon: Manya Silvas, MD;  Location: [facility] ENDOSCOPY;  Service: Endoscopy;  Laterality: N/A;  . KNEE SURGERY     right  . SHOULDER ARTHROSCOPY WITH OPEN ROTATOR CUFF REPAIR Right 08/20/2017   Procedure: SHOULDER ARTHROSCOPY WITH OPEN ROTATOR CUFF REPAIR;  Surgeon: Corky Mull, MD;  Location: [facility] ORS;  Service: Orthopedics;  Laterality: Right;  . SHOULDER ARTHROSCOPY WITH SUBACROMIAL DECOMPRESSION, ROTATOR CUFF REPAIR AND BICEP TENDON REPAIR Left 05/10/2016   Procedure: SHOULDER ARTHROSCOPY WITH DEBTRIDEMENT, DECOMPRESSION, REPAIR OF MASSIVE ROTATOR CUFF TEAR AND BICEP TENODESIS;  Surgeon: Corky Mull, MD;  Location: [facility] ORS;  Service: Orthopedics;  Laterality: Left;  Massive cuff tear    Home Medications:  Allergies as of 03/05/2018      Reactions   Lidocaine Anaphylaxis   Penicillins Rash, Other (See Comments)   Has patient had a PCN reaction causing immediate rash, facial/tongue/throat swelling, SOB or lightheadedness with hypotension: No Has patient had a PCN reaction causing severe rash involving mucus membranes or  skin necrosis: No Has patient had a PCN reaction that required hospitalization No Has patient had a PCN reaction occurring within the last 10 years: No If all of the above answers are "NO", then may proceed with Cephalosporin use.      Medication List       Accurate as of March 05, 2018  2:00 PM. Always use your most recent med list.        albuterol 108 (90 Base) MCG/ACT  inhaler Commonly known as:  PROVENTIL HFA;VENTOLIN HFA Inhale 2 puffs into the lungs every 4 (four) hours as needed for wheezing or shortness of breath.   albuterol (2.5 MG/3ML) 0.083% nebulizer solution Commonly known as:  PROVENTIL Inhale 2.5 mg into the lungs every 6 (six) hours as needed for wheezing or shortness of breath.   aspirin EC 81 MG tablet Take 81 mg by mouth at bedtime.   diazepam 5 MG tablet Commonly known as:  VALIUM Take 5 mg by mouth 3 times weekly if needed for restless leg   finasteride 5 MG tablet Commonly known as:  PROSCAR Take 1 tablet (5 mg total) by mouth daily.   FLUoxetine 20 MG capsule Commonly known as:  PROZAC Take 20 mg by mouth 2 (two) times daily.   gabapentin 300 MG capsule Commonly known as:  NEURONTIN Take 900 mg by mouth at bedtime.   losartan 25 MG tablet Commonly known as:  COZAAR Take 25 mg by mouth daily.   montelukast 10 MG tablet Commonly known as:  SINGULAIR Take 10 mg by mouth at bedtime.   nitroGLYCERIN 0.4 MG SL tablet Commonly known as:  NITROSTAT Place 0.4 mg under the tongue every 5 (five) minutes as needed for chest pain.   omeprazole 20 MG capsule Commonly known as:  PRILOSEC Take 20 mg by mouth daily.   Oxycodone HCl 10 MG Tabs Take 10 mg by mouth 4 times daily   oxyCODONE 5 MG immediate release tablet Commonly known as:  ROXICODONE Take 1-2 tablets (5-10 mg total) by mouth every 6 (six) hours as needed for severe pain.   simvastatin 40 MG tablet Commonly known as:  ZOCOR Take 40 mg by mouth at bedtime.   tamsulosin 0.4 MG Caps capsule Commonly known as:  FLOMAX Take 1 capsule (0.4 mg total) by mouth daily after supper.   TRELEGY ELLIPTA 100-62.5-25 MCG/INH Aepb Generic drug:  Fluticasone-Umeclidin-Vilant Inhale 1 puff into the lungs daily.       Allergies:  Allergies  Allergen Reactions  . Lidocaine Anaphylaxis  . Penicillins Rash and Other (See Comments)    Has patient had a PCN reaction  causing immediate rash, facial/tongue/throat swelling, SOB or lightheadedness with hypotension: No Has patient had a PCN reaction causing severe rash involving mucus membranes or skin necrosis: No Has patient had a PCN reaction that required hospitalization No Has patient had a PCN reaction occurring within the last 10 years: No If all of the above answers are "NO", then may proceed with Cephalosporin use.     Family History: Family History  Problem Relation Age of Onset  . Cancer Mother   . COPD Father 52       black lung    Social History:  reports that he has been smoking cigarettes. He has a 15.00 pack-year smoking history. He quit smokeless tobacco use about 20 years ago. He reports current alcohol use. He reports that he does not use drugs.  ROS: UROLOGY Frequent Urination?: No Hard to postpone urination?:  No Burning/pain with urination?: No Get up at night to urinate?: No Leakage of urine?: No Urine stream starts and stops?: No Trouble starting stream?: No Do you have to strain to urinate?: No Blood in urine?: No Urinary tract infection?: No Sexually transmitted disease?: No Injury to kidneys or bladder?: No Painful intercourse?: No Weak stream?: No Erection problems?: Yes Penile pain?: No  Gastrointestinal Nausea?: No Vomiting?: No Indigestion/heartburn?: No Diarrhea?: No Constipation?: No  Constitutional Fever: No Night sweats?: No Weight loss?: No Fatigue?: Yes  Skin Skin rash/lesions?: No Itching?: No  Eyes Blurred vision?: No Double vision?: No  Ears/Nose/Throat Sore throat?: No Sinus problems?: Yes  Hematologic/Lymphatic Swollen glands?: No Easy bruising?: Yes  Cardiovascular Leg swelling?: No Chest pain?: No  Respiratory Cough?: Yes Shortness of breath?: Yes  Endocrine Excessive thirst?: Yes  Musculoskeletal Back pain?: Yes Joint pain?: Yes  Neurological Headaches?: No Dizziness?: No  Psychologic Depression?:  No Anxiety?: No  Physical Exam: BP 134/84 (BP Location: Left Arm, Patient Position: Sitting, Cuff Size: Normal)   Pulse (!) 102   Ht 5\' 5"  (1.651 m)   Wt 60.1 kg   BMI 22.03 kg/m   Constitutional:  Alert and oriented, No acute distress. HEENT: [place] AT, moist mucus membranes.  Trachea midline, no masses. Cardiovascular: No clubbing, cyanosis, or edema. Respiratory: Normal respiratory effort, no increased work of breathing. GI: Abdomen is soft, nontender, nondistended, no abdominal masses GU: No CVA tenderness DRE: prostate 50 g, smooth, no hard area or nodule  Lymph: No cervical or inguinal lymphadenopathy. Skin: No rashes, bruises or suspicious lesions. Neurologic: Grossly intact, no focal deficits, moving all 4 extremities. Psychiatric: Normal mood and affect.  Laboratory Data: Lab Results  Component Value Date   WBC 8.8 06/15/2016   HGB 14.5 06/15/2016   HCT 42.3 06/15/2016   MCV 97.4 06/15/2016   PLT 178 06/15/2016    Lab Results  Component Value Date   CREATININE 1.07 06/15/2016    No results found for: PSA  No results found for: TESTOSTERONE  No results found for: HGBA1C  Urinalysis    Component Value Date/Time   APPEARANCEUR Clear 11/02/2014 1602   GLUCOSEU Negative 11/02/2014 1602   BILIRUBINUR Negative 11/02/2014 1602   PROTEINUR Negative 11/02/2014 1602   NITRITE Negative 11/02/2014 1602   LEUKOCYTESUR Negative 11/02/2014 1602    Lab Results  Component Value Date   LABMICR See below: 11/02/2014   WBCUA 0-5 11/02/2014   RBCUA None seen 11/02/2014   LABEPIT None seen 11/02/2014   MUCUS Present (A) 11/02/2014   BACTERIA None seen 11/02/2014    Pertinent Imaging:  none No results found for this or any previous visit. No results found for this or any previous visit. No results found for this or any previous visit. No results found for this or any previous visit. No results found for this or any previous visit. No results found for this or any  previous visit. No results found for this or any previous visit. No results found for this or any previous visit.  Assessment & Plan:    BPH with LUTS - doing well on combination therapy. Continue.   No follow-ups on file.  Festus Aloe, MD  [facility] [address], [place], [place] [address] [phone]

## 2018-03-06 ENCOUNTER — Other Ambulatory Visit: Payer: Self-pay

## 2018-03-06 DIAGNOSIS — R310 Gross hematuria: Secondary | ICD-10-CM

## 2018-03-06 LAB — PSA: Prostate Specific Ag, Serum: 0.2 ng/mL (ref 0.0–4.0)

## 2018-03-06 MED ORDER — FINASTERIDE 5 MG PO TABS: 5.0000 mg | ORAL_TABLET | Freq: Every day | ORAL | 3 refills | Status: DC

## 2018-06-16 DIAGNOSIS — S39012A Strain of muscle, fascia and tendon of lower back, initial encounter: Secondary | ICD-10-CM | POA: Insufficient documentation

## 2018-06-16 DIAGNOSIS — S76011A Strain of muscle, fascia and tendon of right hip, initial encounter: Secondary | ICD-10-CM | POA: Insufficient documentation

## 2018-06-24 ENCOUNTER — Other Ambulatory Visit: Payer: Self-pay

## 2018-06-24 ENCOUNTER — Other Ambulatory Visit: Payer: Self-pay | Admitting: Surgery

## 2018-06-24 DIAGNOSIS — S76011S Strain of muscle, fascia and tendon of right hip, sequela: Secondary | ICD-10-CM

## 2018-06-24 DIAGNOSIS — N401 Enlarged prostate with lower urinary tract symptoms: Secondary | ICD-10-CM

## 2018-06-24 MED ORDER — TAMSULOSIN HCL 0.4 MG PO CAPS: 0.4000 mg | ORAL_CAPSULE | Freq: Every day | ORAL | 1 refills | Status: DC

## 2018-06-26 ENCOUNTER — Ambulatory Visit
Admission: RE | Admit: 2018-06-26 | Discharge: 2018-06-26 | Disposition: A | Discharge status: Home / Self Care | Payer: Medicare Other | Source: Ambulatory Visit | Attending: Surgery | Admitting: Surgery

## 2018-06-26 ENCOUNTER — Other Ambulatory Visit: Payer: Self-pay | Admitting: Surgery

## 2018-06-26 ENCOUNTER — Other Ambulatory Visit: Payer: Self-pay

## 2018-06-26 DIAGNOSIS — M545 Low back pain, unspecified: Secondary | ICD-10-CM

## 2018-06-26 DIAGNOSIS — S76011S Strain of muscle, fascia and tendon of right hip, sequela: Secondary | ICD-10-CM | POA: Insufficient documentation

## 2018-06-26 DIAGNOSIS — S86911A Strain of unspecified muscle(s) and tendon(s) at lower leg level, right leg, initial encounter: Secondary | ICD-10-CM | POA: Insufficient documentation

## 2018-06-30 MED ORDER — CLINDAMYCIN PHOSPHATE 900 MG/50ML IV SOLN
900.0000 mg | Freq: Once | INTRAVENOUS | Status: AC
  Administered 2018-07-01: 08:00:00 900 mg via INTRAVENOUS

## 2018-07-01 ENCOUNTER — Encounter
Admission: RE | Disposition: A | Discharge status: Home Health | Payer: Self-pay | Source: Home / Self Care | Attending: Surgery

## 2018-07-01 ENCOUNTER — Inpatient Hospital Stay: Payer: Medicare Other

## 2018-07-01 ENCOUNTER — Inpatient Hospital Stay
Admission: RE | Admit: 2018-07-01 | Discharge: 2018-07-02 | DRG: 470 | Disposition: A | Discharge status: Home Health | Payer: Medicare Other | Attending: Surgery | Admitting: Surgery

## 2018-07-01 ENCOUNTER — Inpatient Hospital Stay: Payer: Medicare Other | Admitting: Anesthesiology

## 2018-07-01 ENCOUNTER — Other Ambulatory Visit: Payer: Self-pay

## 2018-07-01 ENCOUNTER — Encounter: Payer: Self-pay | Admitting: Anesthesiology

## 2018-07-01 DIAGNOSIS — F419 Anxiety disorder, unspecified: Secondary | ICD-10-CM | POA: Diagnosis not present

## 2018-07-01 DIAGNOSIS — M48061 Spinal stenosis, lumbar region without neurogenic claudication: Secondary | ICD-10-CM | POA: Diagnosis present

## 2018-07-01 DIAGNOSIS — Z8673 Personal history of transient ischemic attack (TIA), and cerebral infarction without residual deficits: Secondary | ICD-10-CM | POA: Diagnosis not present

## 2018-07-01 DIAGNOSIS — M84459A Pathological fracture, hip, unspecified, initial encounter for fracture: Secondary | ICD-10-CM | POA: Diagnosis not present

## 2018-07-01 DIAGNOSIS — J449 Chronic obstructive pulmonary disease, unspecified: Secondary | ICD-10-CM | POA: Diagnosis present

## 2018-07-01 DIAGNOSIS — I714 Abdominal aortic aneurysm, without rupture: Secondary | ICD-10-CM | POA: Diagnosis not present

## 2018-07-01 DIAGNOSIS — E785 Hyperlipidemia, unspecified: Secondary | ICD-10-CM | POA: Diagnosis present

## 2018-07-01 DIAGNOSIS — M879 Osteonecrosis, unspecified: Principal | ICD-10-CM | POA: Diagnosis present

## 2018-07-01 DIAGNOSIS — M25451 Effusion, right hip: Secondary | ICD-10-CM | POA: Diagnosis present

## 2018-07-01 DIAGNOSIS — Z79899 Other long term (current) drug therapy: Secondary | ICD-10-CM | POA: Diagnosis not present

## 2018-07-01 DIAGNOSIS — M1990 Unspecified osteoarthritis, unspecified site: Secondary | ICD-10-CM | POA: Diagnosis not present

## 2018-07-01 DIAGNOSIS — Z7982 Long term (current) use of aspirin: Secondary | ICD-10-CM

## 2018-07-01 DIAGNOSIS — J441 Chronic obstructive pulmonary disease with (acute) exacerbation: Secondary | ICD-10-CM | POA: Diagnosis present

## 2018-07-01 DIAGNOSIS — M659 Synovitis and tenosynovitis, unspecified: Secondary | ICD-10-CM | POA: Diagnosis present

## 2018-07-01 DIAGNOSIS — K589 Irritable bowel syndrome without diarrhea: Secondary | ICD-10-CM | POA: Diagnosis not present

## 2018-07-01 DIAGNOSIS — M5137 Other intervertebral disc degeneration, lumbosacral region: Secondary | ICD-10-CM | POA: Diagnosis not present

## 2018-07-01 DIAGNOSIS — Z7952 Long term (current) use of systemic steroids: Secondary | ICD-10-CM | POA: Diagnosis not present

## 2018-07-01 DIAGNOSIS — F17210 Nicotine dependence, cigarettes, uncomplicated: Secondary | ICD-10-CM | POA: Diagnosis present

## 2018-07-01 DIAGNOSIS — I252 Old myocardial infarction: Secondary | ICD-10-CM | POA: Diagnosis not present

## 2018-07-01 DIAGNOSIS — I2510 Atherosclerotic heart disease of native coronary artery without angina pectoris: Secondary | ICD-10-CM | POA: Diagnosis not present

## 2018-07-01 DIAGNOSIS — Z79891 Long term (current) use of opiate analgesic: Secondary | ICD-10-CM

## 2018-07-01 DIAGNOSIS — Z880 Allergy status to penicillin: Secondary | ICD-10-CM

## 2018-07-01 DIAGNOSIS — G894 Chronic pain syndrome: Secondary | ICD-10-CM | POA: Diagnosis not present

## 2018-07-01 DIAGNOSIS — M25551 Pain in right hip: Secondary | ICD-10-CM | POA: Diagnosis present

## 2018-07-01 DIAGNOSIS — Z825 Family history of asthma and other chronic lower respiratory diseases: Secondary | ICD-10-CM

## 2018-07-01 DIAGNOSIS — Z419 Encounter for procedure for purposes other than remedying health state, unspecified: Secondary | ICD-10-CM

## 2018-07-01 DIAGNOSIS — G2581 Restless legs syndrome: Secondary | ICD-10-CM | POA: Diagnosis not present

## 2018-07-01 DIAGNOSIS — Z96641 Presence of right artificial hip joint: Secondary | ICD-10-CM

## 2018-07-01 DIAGNOSIS — I10 Essential (primary) hypertension: Secondary | ICD-10-CM | POA: Diagnosis not present

## 2018-07-01 DIAGNOSIS — Z888 Allergy status to other drugs, medicaments and biological substances status: Secondary | ICD-10-CM

## 2018-07-01 HISTORY — PX: TOTAL HIP ARTHROPLASTY: SHX124

## 2018-07-01 LAB — CBC WITH DIFFERENTIAL/PLATELET
Abs Immature Granulocytes: 0.07 10*3/uL (ref 0.00–0.07)
Basophils Absolute: 0 10*3/uL (ref 0.0–0.1)
Basophils Relative: 0 %
Eosinophils Absolute: 0.2 10*3/uL (ref 0.0–0.5)
Eosinophils Relative: 2 %
HCT: 45.6 % (ref 39.0–52.0)
Hemoglobin: 15.7 g/dL (ref 13.0–17.0)
Immature Granulocytes: 1 %
Lymphocytes Relative: 16 %
Lymphs Abs: 1.9 10*3/uL (ref 0.7–4.0)
MCH: 33.1 pg (ref 26.0–34.0)
MCHC: 34.4 g/dL (ref 30.0–36.0)
MCV: 96 fL (ref 80.0–100.0)
Monocytes Absolute: 1.2 10*3/uL — ABNORMAL HIGH (ref 0.1–1.0)
Monocytes Relative: 10 %
Neutro Abs: 8.3 10*3/uL — ABNORMAL HIGH (ref 1.7–7.7)
Neutrophils Relative %: 71 %
Platelets: 225 10*3/uL (ref 150–400)
RBC: 4.75 MIL/uL (ref 4.22–5.81)
RDW: 13.3 % (ref 11.5–15.5)
WBC: 11.7 10*3/uL — ABNORMAL HIGH (ref 4.0–10.5)
nRBC: 0 % (ref 0.0–0.2)

## 2018-07-01 LAB — URINALYSIS, ROUTINE W REFLEX MICROSCOPIC
Bilirubin Urine: NEGATIVE
Glucose, UA: NEGATIVE mg/dL
Hgb urine dipstick: NEGATIVE
Ketones, ur: 5 mg/dL — AB
Leukocytes,Ua: NEGATIVE
Nitrite: NEGATIVE
Protein, ur: NEGATIVE mg/dL
Specific Gravity, Urine: 1.015 (ref 1.005–1.030)
pH: 6 (ref 5.0–8.0)

## 2018-07-01 LAB — BASIC METABOLIC PANEL
Anion gap: 12 (ref 5–15)
BUN: 12 mg/dL (ref 8–23)
CO2: 22 mmol/L (ref 22–32)
Calcium: 9.1 mg/dL (ref 8.9–10.3)
Chloride: 102 mmol/L (ref 98–111)
Creatinine, Ser: 1.28 mg/dL — ABNORMAL HIGH (ref 0.61–1.24)
GFR calc Af Amer: >60 mL/min (ref >60)
GFR calc non Af Amer: 60 mL/min — ABNORMAL LOW (ref >60)
Glucose, Bld: 98 mg/dL (ref 70–99)
Potassium: 3.9 mmol/L (ref 3.5–5.1)
Sodium: 136 mmol/L (ref 135–145)

## 2018-07-01 LAB — MRSA PCR SCREENING: MRSA by PCR: NEGATIVE

## 2018-07-01 LAB — TYPE AND SCREEN
ABO/RH(D): A POS
Antibody Screen: NEGATIVE

## 2018-07-01 SURGERY — ARTHROPLASTY, HIP, TOTAL,POSTERIOR APPROACH
Anesthesia: Spinal | Site: Hip | Laterality: Right

## 2018-07-01 MED ORDER — PREDNISONE 10 MG PO TABS
10.0000 mg | ORAL_TABLET | Freq: Every day | ORAL | Status: DC
  Administered 2018-07-02: 10 mg via ORAL
  Filled 2018-07-01: qty 1

## 2018-07-01 MED ORDER — FINASTERIDE 5 MG PO TABS
5.0000 mg | ORAL_TABLET | Freq: Every day | ORAL | Status: DC
  Administered 2018-07-02: 5 mg via ORAL
  Filled 2018-07-01: qty 1

## 2018-07-01 MED ORDER — ALBUTEROL SULFATE (2.5 MG/3ML) 0.083% IN NEBU: 2.5000 mg | INHALATION_SOLUTION | Freq: Four times a day (QID) | RESPIRATORY_TRACT | Status: DC | PRN

## 2018-07-01 MED ORDER — UMECLIDINIUM BROMIDE 62.5 MCG/INH IN AEPB
1.0000 | INHALATION_SPRAY | Freq: Every day | RESPIRATORY_TRACT | Status: DC
  Filled 2018-07-01: qty 7

## 2018-07-01 MED ORDER — BUPIVACAINE HCL (PF) 0.5 % IJ SOLN
INTRAMUSCULAR | Status: DC | PRN
  Administered 2018-07-01: 3 mL

## 2018-07-01 MED ORDER — SODIUM CHLORIDE 0.9 % IV SOLN
INTRAVENOUS | Status: DC
  Administered 2018-07-01: 14:00:00 via INTRAVENOUS

## 2018-07-01 MED ORDER — ALBUTEROL SULFATE HFA 108 (90 BASE) MCG/ACT IN AERS
2.0000 | INHALATION_SPRAY | RESPIRATORY_TRACT | Status: DC | PRN
  Filled 2018-07-01: qty 6.7

## 2018-07-01 MED ORDER — ENOXAPARIN SODIUM 40 MG/0.4ML ~~LOC~~ SOLN
40.0000 mg | SUBCUTANEOUS | Status: DC
  Administered 2018-07-02: 40 mg via SUBCUTANEOUS
  Filled 2018-07-01: qty 0.4

## 2018-07-01 MED ORDER — OXYCODONE HCL 5 MG PO TABS: 10.0000 mg | ORAL_TABLET | ORAL | Status: DC | PRN

## 2018-07-01 MED ORDER — ROFLUMILAST 500 MCG PO TABS
250.0000 ug | ORAL_TABLET | Freq: Every day | ORAL | Status: DC
  Administered 2018-07-01: 250 ug via ORAL
  Filled 2018-07-01 (×2): qty 1

## 2018-07-01 MED ORDER — METOCLOPRAMIDE HCL 5 MG PO TABS: 5.0000 mg | ORAL_TABLET | Freq: Three times a day (TID) | ORAL | Status: DC | PRN

## 2018-07-01 MED ORDER — METOCLOPRAMIDE HCL 5 MG/ML IJ SOLN: 5.0000 mg | Freq: Three times a day (TID) | INTRAMUSCULAR | Status: DC | PRN

## 2018-07-01 MED ORDER — HYDROMORPHONE HCL 1 MG/ML IJ SOLN: 0.5000 mg | INTRAMUSCULAR | Status: DC | PRN

## 2018-07-01 MED ORDER — ONDANSETRON HCL 4 MG PO TABS: 4.0000 mg | ORAL_TABLET | Freq: Four times a day (QID) | ORAL | Status: DC | PRN

## 2018-07-01 MED ORDER — OXYCODONE HCL 5 MG PO TABS: 5.0000 mg | ORAL_TABLET | Freq: Once | ORAL | Status: DC | PRN

## 2018-07-01 MED ORDER — KETOROLAC TROMETHAMINE 30 MG/ML IJ SOLN
30.0000 mg | Freq: Once | INTRAMUSCULAR | Status: AC
  Administered 2018-07-01: 30 mg via INTRAVENOUS

## 2018-07-01 MED ORDER — FLUTICASONE FUROATE-VILANTEROL 100-25 MCG/INH IN AEPB
1.0000 | INHALATION_SPRAY | Freq: Every day | RESPIRATORY_TRACT | Status: DC
  Filled 2018-07-01: qty 28

## 2018-07-01 MED ORDER — KETOROLAC TROMETHAMINE 15 MG/ML IJ SOLN
15.0000 mg | Freq: Four times a day (QID) | INTRAMUSCULAR | Status: DC
  Administered 2018-07-02 (×2): 15 mg via INTRAVENOUS
  Filled 2018-07-01 (×4): qty 1

## 2018-07-01 MED ORDER — PROPOFOL 500 MG/50ML IV EMUL
INTRAVENOUS | Status: DC | PRN
  Administered 2018-07-01: 125 ug/kg/min via INTRAVENOUS

## 2018-07-01 MED ORDER — FENTANYL CITRATE (PF) 100 MCG/2ML IJ SOLN: 25.0000 ug | INTRAMUSCULAR | Status: DC | PRN

## 2018-07-01 MED ORDER — FAMOTIDINE 20 MG PO TABS
20.0000 mg | ORAL_TABLET | Freq: Once | ORAL | Status: AC
  Administered 2018-07-01: 20 mg via ORAL

## 2018-07-01 MED ORDER — GABAPENTIN 300 MG PO CAPS
900.0000 mg | ORAL_CAPSULE | Freq: Every day | ORAL | Status: DC
  Administered 2018-07-01: 22:00:00 900 mg via ORAL
  Filled 2018-07-01: qty 3

## 2018-07-01 MED ORDER — SODIUM CHLORIDE (PF) 0.9 % IJ SOLN
INTRAMUSCULAR | Status: AC
  Filled 2018-07-01: qty 100

## 2018-07-01 MED ORDER — MAGNESIUM HYDROXIDE 400 MG/5ML PO SUSP
30.0000 mL | Freq: Every day | ORAL | Status: DC | PRN
  Filled 2018-07-01: qty 30

## 2018-07-01 MED ORDER — PROPOFOL 500 MG/50ML IV EMUL
INTRAVENOUS | Status: AC
  Filled 2018-07-01: qty 50

## 2018-07-01 MED ORDER — FENTANYL CITRATE (PF) 100 MCG/2ML IJ SOLN
25.0000 ug | Freq: Once | INTRAMUSCULAR | Status: AC
  Administered 2018-07-01: 25 ug via INTRAVENOUS

## 2018-07-01 MED ORDER — BUPIVACAINE-EPINEPHRINE (PF) 0.5% -1:200000 IJ SOLN
INTRAMUSCULAR | Status: AC
  Filled 2018-07-01: qty 30

## 2018-07-01 MED ORDER — TRAMADOL HCL 50 MG PO TABS: 50.0000 mg | ORAL_TABLET | Freq: Four times a day (QID) | ORAL | Status: DC | PRN

## 2018-07-01 MED ORDER — DIPHENHYDRAMINE HCL 12.5 MG/5ML PO ELIX
12.5000 mg | ORAL_SOLUTION | ORAL | Status: DC | PRN
  Filled 2018-07-01: qty 10

## 2018-07-01 MED ORDER — DIAZEPAM 5 MG PO TABS: 5.0000 mg | ORAL_TABLET | Freq: Every evening | ORAL | Status: DC | PRN

## 2018-07-01 MED ORDER — TRANEXAMIC ACID 1000 MG/10ML IV SOLN
INTRAVENOUS | Status: AC
  Filled 2018-07-01: qty 10

## 2018-07-01 MED ORDER — CLINDAMYCIN PHOSPHATE 900 MG/50ML IV SOLN
900.0000 mg | Freq: Four times a day (QID) | INTRAVENOUS | Status: AC
  Administered 2018-07-01 – 2018-07-02 (×2): 900 mg via INTRAVENOUS
  Filled 2018-07-01 (×3): qty 50

## 2018-07-01 MED ORDER — BUPIVACAINE LIPOSOME 1.3 % IJ SUSP
INTRAMUSCULAR | Status: AC
  Filled 2018-07-01: qty 20

## 2018-07-01 MED ORDER — METHYLPREDNISOLONE SODIUM SUCC 125 MG IJ SOLR
125.0000 mg | Freq: Two times a day (BID) | INTRAMUSCULAR | Status: AC
  Administered 2018-07-01: 125 mg via INTRAVENOUS
  Filled 2018-07-01: qty 2

## 2018-07-01 MED ORDER — FAMOTIDINE 20 MG PO TABS
ORAL_TABLET | ORAL | Status: AC
  Filled 2018-07-01: qty 1

## 2018-07-01 MED ORDER — BUPIVACAINE-EPINEPHRINE (PF) 0.5% -1:200000 IJ SOLN
INTRAMUSCULAR | Status: DC | PRN
  Administered 2018-07-01: 30 mL

## 2018-07-01 MED ORDER — BISACODYL 10 MG RE SUPP: 10.0000 mg | Freq: Every day | RECTAL | Status: DC | PRN

## 2018-07-01 MED ORDER — SODIUM CHLORIDE 0.9 % IV SOLN
INTRAVENOUS | Status: DC | PRN
  Administered 2018-07-01: 60 mL

## 2018-07-01 MED ORDER — TIZANIDINE HCL 4 MG PO TABS
4.0000 mg | ORAL_TABLET | Freq: Three times a day (TID) | ORAL | Status: DC | PRN
  Filled 2018-07-01: qty 1

## 2018-07-01 MED ORDER — IPRATROPIUM-ALBUTEROL 0.5-2.5 (3) MG/3ML IN SOLN
3.0000 mL | Freq: Once | RESPIRATORY_TRACT | Status: AC
  Administered 2018-07-01: 11:00:00 3 mL via RESPIRATORY_TRACT

## 2018-07-01 MED ORDER — ACETAMINOPHEN 500 MG PO TABS
1000.0000 mg | ORAL_TABLET | Freq: Four times a day (QID) | ORAL | Status: AC
  Administered 2018-07-01 – 2018-07-02 (×3): 1000 mg via ORAL
  Filled 2018-07-01 (×3): qty 2

## 2018-07-01 MED ORDER — CLINDAMYCIN PHOSPHATE 900 MG/50ML IV SOLN
INTRAVENOUS | Status: AC
  Filled 2018-07-01: qty 50

## 2018-07-01 MED ORDER — MIDAZOLAM HCL 5 MG/5ML IJ SOLN
INTRAMUSCULAR | Status: DC | PRN
  Administered 2018-07-01 (×2): 1 mg via INTRAVENOUS

## 2018-07-01 MED ORDER — TAMSULOSIN HCL 0.4 MG PO CAPS
0.4000 mg | ORAL_CAPSULE | Freq: Every day | ORAL | Status: DC
  Administered 2018-07-01: 0.4 mg via ORAL
  Filled 2018-07-01 (×2): qty 1

## 2018-07-01 MED ORDER — PHENYLEPHRINE HCL (PRESSORS) 10 MG/ML IV SOLN
INTRAVENOUS | Status: AC
  Filled 2018-07-01: qty 1

## 2018-07-01 MED ORDER — FLEET ENEMA 7-19 GM/118ML RE ENEM: 1.0000 | ENEMA | Freq: Once | RECTAL | Status: DC | PRN

## 2018-07-01 MED ORDER — ASPIRIN EC 81 MG PO TBEC
81.0000 mg | DELAYED_RELEASE_TABLET | Freq: Every day | ORAL | Status: DC
  Administered 2018-07-01: 81 mg via ORAL
  Filled 2018-07-01: qty 1

## 2018-07-01 MED ORDER — ONDANSETRON HCL 4 MG/2ML IJ SOLN: 4.0000 mg | Freq: Four times a day (QID) | INTRAMUSCULAR | Status: DC | PRN

## 2018-07-01 MED ORDER — MIDAZOLAM HCL 2 MG/2ML IJ SOLN
INTRAMUSCULAR | Status: AC
  Filled 2018-07-01: qty 2

## 2018-07-01 MED ORDER — FLUOXETINE HCL 20 MG PO CAPS
20.0000 mg | ORAL_CAPSULE | Freq: Two times a day (BID) | ORAL | Status: DC
  Administered 2018-07-01 – 2018-07-02 (×2): 20 mg via ORAL
  Filled 2018-07-01 (×3): qty 1

## 2018-07-01 MED ORDER — SIMVASTATIN 20 MG PO TABS
40.0000 mg | ORAL_TABLET | Freq: Every day | ORAL | Status: DC
  Administered 2018-07-01: 22:00:00 40 mg via ORAL
  Filled 2018-07-01: qty 2

## 2018-07-01 MED ORDER — TRANEXAMIC ACID 1000 MG/10ML IV SOLN
INTRAVENOUS | Status: DC | PRN
  Administered 2018-07-01: 1000 mg via TOPICAL

## 2018-07-01 MED ORDER — IPRATROPIUM-ALBUTEROL 0.5-2.5 (3) MG/3ML IN SOLN
RESPIRATORY_TRACT | Status: AC
  Administered 2018-07-01: 11:00:00 3 mL via RESPIRATORY_TRACT
  Filled 2018-07-01: qty 3

## 2018-07-01 MED ORDER — SODIUM CHLORIDE 0.9 % IV SOLN
INTRAVENOUS | Status: DC | PRN
  Administered 2018-07-01: 25 ug/min via INTRAVENOUS

## 2018-07-01 MED ORDER — MONTELUKAST SODIUM 10 MG PO TABS
10.0000 mg | ORAL_TABLET | Freq: Every day | ORAL | Status: DC
  Administered 2018-07-01: 10 mg via ORAL
  Filled 2018-07-01: qty 1

## 2018-07-01 MED ORDER — KETOROLAC TROMETHAMINE 30 MG/ML IJ SOLN
INTRAMUSCULAR | Status: AC
  Filled 2018-07-01: qty 1

## 2018-07-01 MED ORDER — LOSARTAN POTASSIUM 25 MG PO TABS
25.0000 mg | ORAL_TABLET | Freq: Every day | ORAL | Status: DC
  Administered 2018-07-02: 09:00:00 25 mg via ORAL
  Filled 2018-07-01: qty 1

## 2018-07-01 MED ORDER — METHYLPREDNISOLONE SODIUM SUCC 125 MG IJ SOLR
INTRAMUSCULAR | Status: DC | PRN
  Administered 2018-07-01: 125 mg via INTRAVENOUS

## 2018-07-01 MED ORDER — FLUTICASONE-UMECLIDIN-VILANT 100-62.5-25 MCG/INH IN AEPB: 1.0000 | INHALATION_SPRAY | Freq: Every day | RESPIRATORY_TRACT | Status: DC

## 2018-07-01 MED ORDER — FENTANYL CITRATE (PF) 100 MCG/2ML IJ SOLN
INTRAMUSCULAR | Status: AC
  Filled 2018-07-01: qty 2

## 2018-07-01 MED ORDER — OXYCODONE HCL 5 MG/5ML PO SOLN: 5.0000 mg | Freq: Once | ORAL | Status: DC | PRN

## 2018-07-01 MED ORDER — PROPOFOL 10 MG/ML IV BOLUS
INTRAVENOUS | Status: DC | PRN
  Administered 2018-07-01: 30 mg via INTRAVENOUS

## 2018-07-01 MED ORDER — ACETAMINOPHEN 325 MG PO TABS: 325.0000 mg | ORAL_TABLET | Freq: Four times a day (QID) | ORAL | Status: DC | PRN

## 2018-07-01 MED ORDER — LACTATED RINGERS IV SOLN
INTRAVENOUS | Status: DC
  Administered 2018-07-01 (×2): via INTRAVENOUS

## 2018-07-01 MED ORDER — DOCUSATE SODIUM 100 MG PO CAPS
100.0000 mg | ORAL_CAPSULE | Freq: Two times a day (BID) | ORAL | Status: DC
  Administered 2018-07-01: 100 mg via ORAL
  Filled 2018-07-01 (×2): qty 1

## 2018-07-01 SURGICAL SUPPLY — 59 items
BLADE SAGITTAL WIDE XTHICK NO (BLADE) ×3 IMPLANT
BLADE SURG SZ20 CARB STEEL (BLADE) ×3 IMPLANT
CANISTER SUCT 1200ML W/VALVE (MISCELLANEOUS) ×3 IMPLANT
CANISTER SUCT 3000ML PPV (MISCELLANEOUS) ×6 IMPLANT
CHLORAPREP W/TINT 26 (MISCELLANEOUS) ×6 IMPLANT
COVER WAND RF STERILE (DRAPES) ×3 IMPLANT
DRAPE IMP U-DRAPE 54X76 (DRAPES) ×3 IMPLANT
DRAPE INCISE IOBAN 66X60 STRL (DRAPES) ×3 IMPLANT
DRAPE SHEET LG 3/4 BI-LAMINATE (DRAPES) ×3 IMPLANT
DRAPE SURG 17X11 SM STRL (DRAPES) ×6 IMPLANT
DRSG OPSITE POSTOP 4X10 (GAUZE/BANDAGES/DRESSINGS) ×3 IMPLANT
ELECT BLADE 6.5 EXT (BLADE) ×3 IMPLANT
ELECT CAUTERY BLADE 6.4 (BLADE) ×3 IMPLANT
GLOVE BIO SURGEON STRL SZ7.5 (GLOVE) ×12 IMPLANT
GLOVE BIO SURGEON STRL SZ8 (GLOVE) ×18 IMPLANT
GLOVE BIOGEL PI IND STRL 8 (GLOVE) ×1 IMPLANT
GLOVE BIOGEL PI INDICATOR 8 (GLOVE) ×2
GLOVE INDICATOR 8.0 STRL GRN (GLOVE) ×3 IMPLANT
GOWN STRL REUS W/ TWL LRG LVL3 (GOWN DISPOSABLE) ×2 IMPLANT
GOWN STRL REUS W/ TWL XL LVL3 (GOWN DISPOSABLE) ×2 IMPLANT
GOWN STRL REUS W/TWL LRG LVL3 (GOWN DISPOSABLE) ×4
GOWN STRL REUS W/TWL XL LVL3 (GOWN DISPOSABLE) ×4
HEAD BIOLOX DELTA 36 I +3 HIP (Head) ×3 IMPLANT
HOOD PEEL AWAY FLYTE STAYCOOL (MISCELLANEOUS) ×9 IMPLANT
KIT TURNOVER KIT A (KITS) ×3 IMPLANT
LINER ACE G7 36 SZF HIGH WALL (Liner) ×3 IMPLANT
MAT ABSORB  FLUID 56X50 GRAY (MISCELLANEOUS)
MAT ABSORB FLUID 56X50 GRAY (MISCELLANEOUS) IMPLANT
NDL SAFETY ECLIPSE 18X1.5 (NEEDLE) ×3 IMPLANT
NEEDLE FILTER BLUNT 18X 1/2SAF (NEEDLE) ×2
NEEDLE FILTER BLUNT 18X1 1/2 (NEEDLE) ×1 IMPLANT
NEEDLE HYPO 18GX1.5 SHARP (NEEDLE) ×6
NEEDLE SPNL 20GX3.5 QUINCKE YW (NEEDLE) ×3 IMPLANT
PACK HIP PROSTHESIS (MISCELLANEOUS) ×3 IMPLANT
PILLOW ABDUCTION FOAM SM (MISCELLANEOUS) ×3 IMPLANT
PIN STEINMAN 3/16 (PIN) ×3 IMPLANT
PULSAVAC PLUS IRRIG FAN TIP (DISPOSABLE) ×3
SHELL ACETABULAR 3H 54MM F (Shell) ×3 IMPLANT
SOL .9 NS 3000ML IRR  AL (IV SOLUTION) ×2
SOL .9 NS 3000ML IRR UROMATIC (IV SOLUTION) ×1 IMPLANT
SPONGE LAP 18X18 RF (DISPOSABLE) ×3 IMPLANT
STAPLER SKIN PROX 35W (STAPLE) ×3 IMPLANT
STEM FEMORAL FULL 15X155 ECHO (Stem) ×3 IMPLANT
SUT TICRON 2-0 30IN 311381 (SUTURE) ×9 IMPLANT
SUT VIC AB 0 CT1 36 (SUTURE) ×3 IMPLANT
SUT VIC AB 1 CT1 36 (SUTURE) ×6 IMPLANT
SUT VIC AB 2-0 CT1 (SUTURE) ×15 IMPLANT
SUT VIC AB 2-0 CT1 27 (SUTURE) ×4
SUT VIC AB 2-0 CT1 TAPERPNT 27 (SUTURE) ×2 IMPLANT
SWAB CULTURE AMIES ANAERIB BLU (MISCELLANEOUS) ×3 IMPLANT
SYR 10ML LL (SYRINGE) ×3 IMPLANT
SYR 20CC LL (SYRINGE) ×3 IMPLANT
SYR 30ML LL (SYRINGE) ×6 IMPLANT
SYR 50ML LL SCALE MARK (SYRINGE) ×3 IMPLANT
TAPE TRANSPORE STRL 2 31045 (GAUZE/BANDAGES/DRESSINGS) ×3 IMPLANT
TIP FAN IRRIG PULSAVAC PLUS (DISPOSABLE) ×1 IMPLANT
TOWEL OR 17X26 4PK STRL BLUE (TOWEL DISPOSABLE) ×3 IMPLANT
TRAY FOLEY MTR SLVR 16FR STAT (SET/KITS/TRAYS/PACK) ×3 IMPLANT
WATER STERILE IRR 1000ML POUR (IV SOLUTION) ×9 IMPLANT

## 2018-07-01 NOTE — Anesthesia Preprocedure Evaluation (Addendum)
Anesthesia Evaluation  Patient identified by MRN, date of birth, ID band Patient awake    Reviewed: Allergy & Precautions, H&P , NPO status , Patient's Chart, lab work & pertinent test results  History of Anesthesia Complications Negative for: history of anesthetic complications  Airway Mallampati: III  TM Distance: >3 FB Neck ROM: limited    Dental  (+) Chipped, Poor Dentition, Missing   Pulmonary shortness of breath and with exertion, asthma , pneumonia, COPD, Current Smoker,     + decreased breath sounds+ wheezing  rales    Cardiovascular hypertension, (-) angina+ CAD and + Past MI       Neuro/Psych PSYCHIATRIC DISORDERS  Neuromuscular disease CVA    GI/Hepatic negative GI ROS, Neg liver ROS, neg GERD  ,  Endo/Other  negative endocrine ROS  Renal/GU      Musculoskeletal  (+) Arthritis ,   Abdominal   Peds  Hematology negative hematology ROS (+)   Anesthesia Other Findings Patient reports chronic back pain  Past Medical History: No date: AAA (abdominal aortic aneurysm) without rupture ([place]) 2015: Abdominal aneurysm ([facility])     Comment:  being followed but not large enough to surgically treat No date: Anxiety No date: Arthritis No date: Asthma 07/16/2014: Benign hypertension 07/16/2014: Chest pain on exertion No date: Chronic pain syndrome 02/08/2012: Coccydynia No date: COPD (chronic obstructive pulmonary disease) ([place])     Comment:  patient smokes No date: Coronary artery disease 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     Comment:  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 No date: Depression 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, thoracic No date: IBS (irritable bowel syndrome) 2010: Myocardial infarction ([place]) No date: Restless leg syndrome No date: Sciatica No date: Spinal stenosis of lumbar region 2015: Stroke ([place])     Comment:  no residual symptoms  Past Surgical History: 2012: CARDIAC CATHETERIZATION 11/14/2016:  COLONOSCOPY WITH PROPOFOL; N/A     Comment:  Procedure: COLONOSCOPY WITH PROPOFOL;  Surgeon: Manya Silvas, MD;  Location: [facility] ENDOSCOPY;  Service:               Endoscopy;  Laterality: N/A; 11/14/2016: ESOPHAGOGASTRODUODENOSCOPY (EGD) WITH PROPOFOL; N/A     Comment:  Procedure: ESOPHAGOGASTRODUODENOSCOPY (EGD) WITH               PROPOFOL;  Surgeon: Manya Silvas, MD;  Location:               [facility] ENDOSCOPY;  Service: Endoscopy;  Laterality: N/A; No date: KNEE SURGERY     Comment:  right 08/20/2017: SHOULDER ARTHROSCOPY WITH OPEN ROTATOR CUFF REPAIR; Right     Comment:  Procedure: SHOULDER ARTHROSCOPY WITH OPEN ROTATOR CUFF               REPAIR;  Surgeon: Corky Mull, MD;  Location: [facility] ORS;              Service: Orthopedics;  Laterality: Right; 05/10/2016: SHOULDER ARTHROSCOPY WITH SUBACROMIAL DECOMPRESSION,  ROTATOR CUFF REPAIR AND BICEP TENDON REPAIR; Left     Comment:  Procedure: SHOULDER ARTHROSCOPY WITH DEBTRIDEMENT,               DECOMPRESSION, REPAIR OF MASSIVE ROTATOR CUFF TEAR AND               BICEP TENODESIS;  Surgeon: Corky Mull, MD;  Location:               [facility] ORS;  Service: Orthopedics;  Laterality: Left;                Massive cuff tear  BMI    Body Mass Index:  22.44 kg/m      Reproductive/Obstetrics negative OB ROS                            Anesthesia Physical Anesthesia Plan  ASA: III  Anesthesia Plan: Spinal   Post-op Pain Management:    Induction:   PONV Risk Score and Plan:   Airway Management Planned: Natural Airway and Nasal Cannula  Additional Equipment:   Intra-op Plan:   Post-operative Plan:   Informed Consent: I have reviewed the patients History and Physical, chart, labs and discussed the procedure including the risks, benefits and alternatives for the proposed anesthesia with the patient or authorized representative who has indicated his/her understanding and acceptance.     Dental  Advisory Given  Plan Discussed with: Anesthesiologist, CRNA and Surgeon  Anesthesia Plan Comments: (Patient reports no bleeding problems and no anticoagulant use.  Plan for spinal with backup GA  Patient consented for risks of anesthesia including but not limited to:  - adverse reactions to medications - risk of bleeding, infection, nerve damage and headache - risk of failed spinal - damage to teeth, lips or other oral mucosa - sore throat or hoarseness - Damage to heart, brain, lungs or loss of life  Patient voiced understanding.)        Anesthesia Quick Evaluation

## 2018-07-01 NOTE — Progress Notes (Signed)
 Vein & Vascular Surgery Communication Note   Patient well known to our practice - have being following his AAA. MRI on 06/26/18 "Infrarenal abdominal aortic aneurysm measuring up to 4.4cm in AP diameter. This is increased from the CT study of October 2017 when it measured 3.6 cm". [place] spoke with Poggi in regard to patient.  Patient last seen on 01/30/18 by Dr. Delana Meyer. Will see the patient in six months increments now that AAA is >4cm. No vascular intervention indicated at this time. Discharge follow up placed. Vascular to sign off at this time.  Marcelle Overlie PA-C 07/01/2018 1:52 PM

## 2018-07-01 NOTE — H&P (Signed)
Subjective:  Chief complaint:  Right hip pain.  The patient is a 63 y.o. male who presents today to undergo a right total hip arthroplasty.  Patient has been evauated in the [facility] for ongoing right hip pain since 06/15/18.  The patient had a fall stating that his leg "gave out" on him landing on his left side.  He underwent a right hip intra-articular injection which did not provide any significant benefit.  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  He does have a history of COPD, denies any history of stroke.  He does have a history of a cardiac catheterization.  Patient Active Problem List   Diagnosis Date Noted  . Hyperlipidemia 01/30/2018  . Renal artery stenosis ([place]) 12/05/2015  . 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 Syncope and collapse 12/05/2015  . Essential hypertension 10/15/2015  . COPD exacerbation ([place]) 01/31/2015  . Acute respiratory failure ([place]) 01/31/2015  . AAA (abdominal aortic aneurysm) ([place]) 11/23/2014  . Prostate cancer screening 11/02/2014  . Gross hematuria 11/02/2014  . Chest pain on exertion 07/16/2014  . Benign hypertension 07/16/2014  . 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 Coccydynia 02/08/2012  . 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 Gait disorder 02/08/2012   Past Medical History:  Diagnosis Date  . AAA (abdominal aortic aneurysm) without rupture ([place])   . Abdominal aneurysm ([place]) 2015   being followed but not large enough to surgically treat  . Anxiety   . Arthritis   . Asthma   . Benign hypertension 07/16/2014  . Chest pain on exertion 07/16/2014  . Chronic pain syndrome   . Coccydynia 02/08/2012  . COPD (chronic obstructive pulmonary disease) ([place])    patient smokes  . Coronary artery disease   . 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 Depression   . 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 IBS  (irritable bowel syndrome)   . Myocardial infarction ([place]) 2010  . Restless leg syndrome   . Sciatica   . Spinal stenosis of lumbar region   . Stroke [facility]) 2015   no residual symptoms    Past Surgical History:  Procedure Laterality Date  . CARDIAC CATHETERIZATION  2012  . COLONOSCOPY WITH PROPOFOL N/A 11/14/2016   Procedure: COLONOSCOPY WITH PROPOFOL;  Surgeon: Manya Silvas, MD;  Location: [facility] ENDOSCOPY;  Service: Endoscopy;  Laterality: N/A;  . ESOPHAGOGASTRODUODENOSCOPY (EGD) WITH PROPOFOL N/A 11/14/2016   Procedure: ESOPHAGOGASTRODUODENOSCOPY (EGD) WITH PROPOFOL;  Surgeon: Manya Silvas, MD;  Location: [facility] ENDOSCOPY;  Service: Endoscopy;  Laterality: N/A;  . KNEE SURGERY     right  . SHOULDER ARTHROSCOPY WITH OPEN ROTATOR CUFF REPAIR Right 08/20/2017   Procedure: SHOULDER ARTHROSCOPY WITH OPEN ROTATOR CUFF REPAIR;  Surgeon: Corky Mull, MD;  Location: [facility] ORS;  Service: Orthopedics;  Laterality: Right;  . SHOULDER ARTHROSCOPY WITH SUBACROMIAL DECOMPRESSION, ROTATOR CUFF REPAIR AND BICEP TENDON REPAIR Left 05/10/2016   Procedure: SHOULDER ARTHROSCOPY WITH DEBTRIDEMENT, DECOMPRESSION, REPAIR OF MASSIVE ROTATOR CUFF TEAR AND BICEP TENODESIS;  Surgeon: Corky Mull, MD;  Location: [facility] ORS;  Service: Orthopedics;  Laterality: Left;  Massive cuff tear    Medications Prior to Admission  Medication Sig Dispense Refill Last Dose  . albuterol (PROVENTIL HFA;VENTOLIN HFA) 108 (90 Base) MCG/ACT inhaler Inhale 2 puffs into the lungs every 4 (four) hours as needed for wheezing or shortness of breath.  06/30/2018 at Unknown time  . albuterol (PROVENTIL) (2.5 MG/3ML) 0.083% nebulizer solution Inhale 2.5 mg into the lungs every 6 (six) hours as needed for wheezing or shortness of breath.    06/30/2018 at Unknown time  . aspirin EC 81 MG tablet Take 81 mg by mouth at bedtime.    06/29/2018  . celecoxib (CELEBREX) 200 MG capsule Take 200 mg by mouth 2 (two) times daily.   06/30/2018 at Unknown time   . DALIRESP 250 MCG TABS Take 250 mg by mouth at bedtime.    06/30/2018 at Unknown time  . diazepam (VALIUM) 5 MG tablet Take 5 mg by mouth at bedtime as needed (restless legs).   1 06/29/2018 at Unknown time  . finasteride (PROSCAR) 5 MG tablet Take 1 tablet (5 mg total) by mouth daily. 90 tablet 3 06/30/2018 at Unknown time  . FLUoxetine (PROZAC) 20 MG capsule Take 20 mg by mouth 2 (two) times daily.    06/30/2018 at Unknown time  . gabapentin (NEURONTIN) 300 MG capsule Take 900 mg by mouth at bedtime.    06/30/2018 at Unknown time  . losartan (COZAAR) 25 MG tablet Take 25 mg by mouth daily.    06/30/2018 at Unknown time  . montelukast (SINGULAIR) 10 MG tablet Take 10 mg by mouth at bedtime.   06/30/2018 at Unknown time  . Oxycodone HCl 10 MG TABS Take 10 mg by mouth every 4 (four) hours as needed (pain).   0 06/28/2018  . predniSONE (DELTASONE) 10 MG tablet Take 10 mg by mouth daily with breakfast.   06/30/2018 at Unknown time  . simvastatin (ZOCOR) 40 MG tablet Take 40 mg by mouth at bedtime.    06/30/2018 at Unknown time  . tamsulosin (FLOMAX) 0.4 MG CAPS capsule Take 1 capsule (0.4 mg total) by mouth daily after supper. 90 capsule 1 06/30/2018 at Unknown time  . tiZANidine (ZANAFLEX) 4 MG tablet Take 4 mg by mouth 3 (three) times daily as needed for muscle spasms.   06/30/2018 at Unknown time  . TRELEGY ELLIPTA 100-62.5-25 MCG/INH AEPB Inhale 1 puff into the lungs at bedtime.   5 06/30/2018 at Unknown time   Allergies  Allergen Reactions  . Penicillins Swelling, Rash and Other (See Comments)    Did it involve swelling of the face/tongue/throat, SOB, or low BP? yes Did it involve sudden or severe rash/hives, skin peeling, or any reaction on the inside of your mouth or nose? no Did you need to seek medical attention at a hospital or doctor's office? Yes When did it last happen?years  If all above answers are "NO", may proceed with cephalosporin use.    . Lidocaine Other (See Comments)    unsure    Social  History   Tobacco Use  . Smoking status: Current Every Day Smoker    Packs/day: 0.50    Years: 30.00    Pack years: 15.00    Types: Cigarettes  . Smokeless tobacco: Former Systems developer    Quit date: 02/26/1998  Substance Use Topics  . Alcohol use: Yes    Alcohol/week: 0.0 standard drinks    Comment: beer daily    Family History  Problem Relation Age of Onset  . Cancer Mother   . COPD Father 37       black lung     Review of Systems: As noted above. The patient denies any chest pain, shortness of breath, nausea, vomiting, diarrhea, constipation, belly pain, blood in his/her stool, or burning with urination.  Objective: Temp:  [99 F (37.2 C)] 99 F (37.2 C) (05/05 0651) Pulse Rate:  [87] 87 (05/05 0651) Resp:  [20] 20 (05/05 0651) BP: (184)/(92) 184/92 (05/05 0651) SpO2:  [98 %] 98 % (05/05 0651) Weight:  [63 kg] 63 kg (05/05 0651)  Physical Exam: General:  Alert, no acute distress Psychiatric:  Patient is competent for consent with normal mood and affect Cardiovascular:  RRR  Respiratory:  Clear to auscultation. No wheezing. Non-labored breathing GI:  Abdomen is soft and non-tender Skin:  No lesions in the area of chief complaint Neurologic:  Sensation intact distally Lymphatic:  No axillary or cervical lymphadenopathy  Orthopedic Exam:  Skin inspection of the lower back is unremarkable.  In stance, his spine is straight and his pelvis is level.  He can arise from a seated position with moderate difficulty, and demonstrates an antalgic gait.  He is using a single crutch under his right arm for balance and support during ambulation.  He has moderate-severe tenderness along the upper lumbar spine and lower lumbar spine.  He also has moderate tenderness palpation over the anterolateral aspects of the right hip.  He has no pain over either SI joint or either sciatic notch region.  He can heel raise and toe raise with weakness in toe raising on the right and with weakness in heel  raising on the right.    He has moderate-severe pain with right hip range of motion.  He describes decreased sensation to light touch to the anterior and lateral aspects of the right thigh, as well as to the anterior lateral aspects of the lower leg and to the dorsal and plantar aspects of his right foot as compared to similar areas on the left.  He has good capillary refill to both feet.  Imaging Review: MR OF THE RIGHT HIP WITHOUT CONTRAST  TECHNIQUE: Multiplanar, multisequence MR imaging was performed. No intravenous contrast was administered.  COMPARISON: None.  FINDINGS: Bones: No hip fracture or dislocation.  Subchondral serpiginous signal abnormality in the right superior anterior femoral head with severe marrow edema in the femoral head and neck consistent with avascular necrosis. Subchondral fracture of the right superior anterior femoral head. No articular surface collapse. Marrow edema in the superior acetabulum with a subtle linear component concerning for a subchondral insufficiency fracture without displacement or depression.  No left hip a vascular necrosis.  Normal sacrum and sacroiliac joints. No SI joint widening or erosive changes. Degenerative disc disease with disc height loss at L4-5 and L5-S1.  Articular cartilage and labrum  Articular cartilage: No chondral defect.  Labrum: Large right hip joint effusion with mild synovitis. No left hip joint effusion. No SI joint effusion.  Joint or bursal effusion  Joint effusion: No hip joint effusion. No SI joint effusion.  Bursae: No bursa formation.  Muscles and tendons  Flexors: Normal.  Extensors: Normal.  Abductors: Normal.  Adductors: Normal.  Gluteals: Normal.  Hamstrings: Normal.  Other findings  Miscellaneous: No pelvic free fluid. No fluid collection or hematoma. No inguinal lymphadenopathy. No inguinal hernia.  IMPRESSION: 1. Avascular necrosis of right femoral head severe  marrow edema in the femoral head and neck with a subchondral fracture along the superior anterior aspect and without articular surface collapse. Nondepressed, nondisplaced subchondral insufficiency fracture of right superior acetabulum. Large right hip joint effusion.  Assessment: Avascular necrosis of right hip.  Plan: The treatment options, such as a total hip arthroplasty, have been discussed in detail with the patient and his  family. The risks (including bleeding, infection, nerve and/or blood vessel injury, persistent or recurrent pain, loosening or failure of the components, leg length inequality, dislocation, need for further surgery, blood clots, strokes, heart attacks or arrhythmias, pneumonia, etc.) and benefits of a total hip arthroplasty were discussed. The patient states his/her understanding and agrees to proceed. He agrees to a blood transfusion if necessary. A consent will be signed at the time of surgery.  Raquel Zion Lint, PA-C [place]

## 2018-07-01 NOTE — Op Note (Signed)
07/01/2018  10:30 AM  Patient:   Joseph Peters  Pre-Op Diagnosis:   Acute avascular necrosis, right hip.  Post-Op Diagnosis:   Same.  Procedure:   Right total hip arthroplasty.  Surgeon:   Pascal Lux, MD  Assistant:   Cameron Proud, PA-C  Anesthesia:   Spinal  Findings:   As above.  Complications:   None  EBL:   100 cc  Fluids:   1000 cc crystalloid  UOP:   100 cc  TT:   None  Drains:   None  Closure:   Staples  Implants:   Biomet press-fit system with a #15 laterally offset Echo femoral stem, a 54 mm acetabular shell with an E-poly hi-wall liner, and a 36 mm ceramic head with a +3 mm neck.  Brief Clinical Note:   The patient is a 63 year old male with a three week history of acute onset of right hip pain following an apparent fall. Initial x-rays were unremarkable. His symptoms persist despite a steroid injection, crutch ambulation, and medications. Because of continued symptoms, an MRI scan of the hip was obtained which demonstrated avascular necrosis of the femoral head with extensive bone marrow edema involving both the entire femoral head and neck region, as well as the superior portion of the acetabulum. The patient presents at this time for a right total hip arthroplasty.  Procedure:   The patient was brought into the operating room. After adequate spinal anesthesia was obtained, the patient was repositioned in the left lateral decubitus position and secured using a lateral hip positioner. The right hip and lower extremity were prepped with ChloroPrep solution before being draped sterilely. Preoperative antibiotics were administered. A timeout was performed to verify the appropriate surgical site before a standard posterior approach to the hip was made through an approximately 4-5 inch incision. The incision was carried down through the subcutaneous tissues to expose the gluteal fascia and proximal end of the iliotibial band. These structures were split the length  of the incision and the Charnley self-retaining hip retractor placed. The bursal tissues were swept posteriorly to expose the short external rotators. The anterior border of the piriformis tendon was identified and this plane developed down through the capsule to enter the joint. A flap of tissue was elevated off the posterior aspect of the femoral neck and greater trochanter and retracted posteriorly. This flap included the piriformis tendon, the short external rotators, and the posterior capsule. The soft tissues were elevated off the lateral aspect of the ilium and a large Steinmann pin placed bicortically. With the right leg aligned over the left, a drill bit was placed into the greater trochanter parallel to the Steinmann pin and the distance between these two pins measured in order to optimize leg lengths postoperatively. The drill bit was removed and the hip dislocated. The piriformis fossa was debrided of soft tissues before the intramedullary canal was accessed through this point using a triple step reamer. The canal was reamed sequentially beginning with a #7 tapered reamer and progressing to a #15 tapered reamer. This provided excellent circumferential chatter. Using the appropriate guide, a femoral neck cut was made 10-12 mm above the lesser trochanter. The femoral head was removed.  Attention was directed to the acetabular side. The labrum was debrided circumferentially before the ligamentum teres was removed using a large curette. A line was drawn on the drapes corresponding to the native version of the acetabulum. This line was used as a guide while the acetabulum was reamed  sequentially beginning with a 45 mm reamer and progressing to a 53 mm reamer. This provided excellent circumferential chatter. The 53 mm trial acetabulum was positioned and found to fit quite well. Therefore, the 54 mm acetabular shell was selected and impacted into place with care taken to maintain the appropriate version. The  trial high wall liner was inserted.  Attention was redirected to the femoral side. A box osteotome was used to establish version before the canal was broached sequentially beginning with a #7 broach and progressing to a #15 broach. This was left in place and several trial reductions performed using both a standard and laterally offset neck options, as well as the +0 mm and +3 mm neck lengths. After inserting the "manhole cover", the permanent E-polyethylene hi-wall liner was impacted into the acetabular shell and its locking mechanism verified using a quarter-inch osteotome. Next, the #15 lateral offset femoral stem was impacted into place with care taken to maintain the appropriate version. A repeat trial reduction was performed using the +0 mm and +3 mm neck lengths. The +3 mm neck length demonstrated excellent stability both in extension and external rotation as well as with flexion to 90 and internal rotation beyond 70. It also was stable in the position of sleep. In addition, leg lengths appeared to be restored appropriately, both by reassessing the position of the right leg over the left, as well as by measuring the distance between the Steinmann pin and the drill bit. The 36 mm ceramic head with the +3 mm neck adapter was impacted onto the stem of the femoral component. The Morse taper locking mechanism was verified using manual distraction before the head was relocated and placed through a range of motion with the findings as described above.  The wound was copiously irrigated with bacitracin saline solution via the jet lavage system before the peri-incisional and pericapsular tissues were injected with 30 cc of 0.5% Sensorcaine with epinephrine and 20 cc of Exparel diluted out to 60 cc with normal saline to help with postoperative analgesia. The posterior flap was reapproximated to the posterior aspect of the greater trochanter using #2 Tycron interrupted sutures placed through drill holes. Several  additional #2 Tycron interrupted sutures were used to reinforce this layer of closure. The iliotibial band was reapproximated using #1 Vicryl interrupted sutures before the gluteal fascia was closed using a running #1 Vicryl suture. At this point, 1 g of transexemic acid in 10 cc of normal saline was injected into the joint to help reduce postoperative bleeding. The subcutaneous tissues were closed in several layers using 2-0 Vicryl interrupted sutures before the skin was closed using staples. A sterile occlusive dressing was applied to the wound before the patient was placed into an abduction wedge pillow. The patient was then rolled back into the supine position on his/her hospital bed before being awakened and returned to the recovery room in satisfactory condition after tolerating the procedure well.

## 2018-07-01 NOTE — Anesthesia Post-op Follow-up Note (Signed)
Anesthesia QCDR form completed.        

## 2018-07-01 NOTE — Evaluation (Signed)
Physical Therapy Evaluation Patient Details Name: Joseph Peters MRN: [id number] DOB: [birth year] Today's Date: 07/01/2018   History of Present Illness   63 year old male with a three week history of acute onset of right hip pain following an apparent fall. Initial x-rays were unremarkable. s/p R THA, WBAT. PMH of englarging AAA, HLD, COPD, stroke, thoracic myelopathy, IBS, HTN, past MI, smoker, shoulder surgery    Clinical Impression  Patient alert and oriented, reported return of sensation and able to DF, wiggle toes, and perform quad set. Patient reported living with his wife in a one story home, previously independent, endorses 12 falls in the last 6 months.   Patient stated his R hip pain 5/10 at start of session. Pt educated on weight bearing status, posterior hip precautions, and PT role/POC while at hospital. Pt also instructed and demonstrated bed level therapeutic exercises, AAROM for RLE though improved muscle activation noted with repetitions. The patient was able to perform supine to sit with supervision and verbal cues for technique, and to adhere to precautions. Sat EOB for several minutes to assess BP and minimal dizziness with fair balance. BP assessed in both supine and sitting >170/>95, RN notified. Sit<> stand performed with RW and CGA. Pt was able to ambulate ~70ft to chair, RW and CGA as well as step by step instructions for RW management and step to gait pattern.  Overall the patient demonstrated deficits (see "PT Problem List") that impede the patient's functional abilities, safety, and mobility and would benefit from skilled PT intervention. Recommendation is HHPT with supervision for OOB/mobility.      Follow Up Recommendations Home health PT;Supervision for mobility/OOB    Equipment Recommendations  None recommended by PT(Pt reported having a RW at home)    Recommendations for Other Services       Precautions / Restrictions Precautions Precautions: Fall;Posterior  Hip Precaution Booklet Issued: No Restrictions Weight Bearing Restrictions: Yes RLE Weight Bearing: Weight bearing as tolerated      Mobility  Bed Mobility Overal bed mobility: Needs Assistance Bed Mobility: Supine to Sit     Supine to sit: Supervision     General bed mobility comments: verbal cues for sequencing/adhere to precautions  Transfers Overall transfer level: Needs assistance Equipment used: Rolling walker (2 wheeled) Transfers: Sit to/from Stand Sit to Stand: Min guard         General transfer comment: step by step instructions and cues for hand placement  Ambulation/Gait Ambulation/Gait assistance: Min guard Gait Distance (Feet): 2 Feet Assistive device: Rolling walker (2 wheeled)       General Gait Details: step to gait pattern, cues for RW management, education about WBAT on RLE  Stairs            Wheelchair Mobility    Modified Rankin (Stroke Patients Only)       Balance Overall balance assessment: Needs assistance Sitting-balance support: Feet supported Sitting balance-Leahy Scale: Fair       Standing balance-Leahy Scale: Poor                               Pertinent Vitals/Pain Pain Assessment: 0-10 Pain Score: 5  Pain Location: R hip Pain Descriptors / Indicators: Discomfort Pain Intervention(s): Limited activity within patient's tolerance;Monitored during session;Repositioned;Ice applied;Premedicated before session    [place] expects to be discharged to:: Private residence Living Arrangements: Spouse/significant other Available Help at Discharge: Family Type of Home: House Home Access: Stairs to  enter Entrance Stairs-Rails: Can reach both;Left;Right Entrance Stairs-Number of Steps: 3 Home Layout: One level Home Equipment: Grab bars - toilet;Walker - 2 wheels;Cane - single point;Shower seat - built in;Grab bars - tub/shower;Walker - 4 wheels      Prior Function Level of Independence:  Independent         Comments: Pt endorses at least 12 falls in the last 6 months.      Hand Dominance        Extremity/Trunk Assessment   Upper Extremity Assessment Upper Extremity Assessment: Overall WFL for tasks assessed    Lower Extremity Assessment Lower Extremity Assessment: LLE deficits/detail;RLE deficits/detail RLE Deficits / Details: limitations expected s/p R THA LLE Deficits / Details: WFLs       Communication   Communication: No difficulties  Cognition Arousal/Alertness: Awake/alert Behavior During Therapy: WFL for tasks assessed/performed Overall Cognitive Status: Within Functional Limits for tasks assessed                                        General Comments      Exercises Total Joint Exercises Quad Sets: AROM;Right;10 reps Heel Slides: AROM;Right;10 reps Straight Leg Raises: AAROM;Right;10 reps Other Exercises Other Exercises: 2nd sit <> stand from chair with cues for hand placement to allow RN to assess skin   Assessment/Plan    PT Assessment Patient needs continued PT services  PT Problem List Decreased strength;Decreased mobility;Decreased range of motion;Decreased knowledge of precautions;Decreased activity tolerance;Decreased balance;Decreased knowledge of use of DME;Pain       PT Treatment Interventions DME instruction;Functional mobility training;Balance training;Patient/family education;Gait training;Therapeutic activities;Neuromuscular re-education;Stair training;Therapeutic exercise    PT Goals (Current goals can be found in the Care Plan section)  Acute Rehab PT Goals Patient Stated Goal: to go home PT Goal Formulation: With patient Time For Goal Achievement: 07/15/18 Potential to Achieve Goals: Good    Frequency BID   Barriers to discharge        Co-evaluation               AM-PAC PT "6 Clicks" Mobility  Outcome Measure Help needed turning from your back to your side while in a flat bed without  using bedrails?: A Little Help needed moving from lying on your back to sitting on the side of a flat bed without using bedrails?: A Little Help needed moving to and from a bed to a chair (including a wheelchair)?: A Little Help needed standing up from a chair using your arms (e.g., wheelchair or bedside chair)?: A Little Help needed to walk in hospital room?: A Little Help needed climbing 3-5 steps with a railing? : A Lot 6 Click Score: 17    End of Session Equipment Utilized During Treatment: Gait belt Activity Tolerance: Patient tolerated treatment well Patient left: with SCD's reapplied;in chair;with chair alarm set;with call bell/phone within reach Nurse Communication: Mobility status PT Visit Diagnosis: Unsteadiness on feet (R26.81);Muscle weakness (generalized) (M62.81);History of falling (Z91.81);Difficulty in walking, not elsewhere classified (R26.2);Pain Pain - Right/Left: Right Pain - part of body: Hip    Time: 8280-0349 PT Time Calculation (min) (ACUTE ONLY): 47 min   Charges:   PT Evaluation $PT Eval Low Complexity: 1 Low PT Treatments $Therapeutic Exercise: 8-22 mins $Therapeutic Activity: 8-22 mins        Lieutenant Diego PT, DPT 3:32 PM,07/01/18 [phone]

## 2018-07-01 NOTE — Transfer of Care (Signed)
Immediate Anesthesia Transfer of Care Note  Patient: Joseph Peters  Procedure(s) Performed: TOTAL HIP ARTHROPLASTY - RIGHT (Right Hip)  Patient Location: PACU  Anesthesia Type:Spinal  Level of Consciousness: sedated  Airway & Oxygen Therapy: Patient Spontanous Breathing and Patient connected to face mask oxygen  Post-op Assessment: Report given to RN and Post -op Vital signs reviewed and stable  Post vital signs: Reviewed and stable  Last Vitals:  Vitals Value Taken Time  BP    Temp    Pulse    Resp    SpO2      Last Pain:  Vitals:   07/01/18 0651  TempSrc: Temporal  PainSc: 9          Complications: No apparent anesthesia complications

## 2018-07-02 DIAGNOSIS — M879 Osteonecrosis, unspecified: Secondary | ICD-10-CM | POA: Diagnosis not present

## 2018-07-02 DIAGNOSIS — M25551 Pain in right hip: Secondary | ICD-10-CM | POA: Diagnosis not present

## 2018-07-02 LAB — BASIC METABOLIC PANEL
Anion gap: 9 (ref 5–15)
BUN: 14 mg/dL (ref 8–23)
CO2: 22 mmol/L (ref 22–32)
Calcium: 8.4 mg/dL — ABNORMAL LOW (ref 8.9–10.3)
Chloride: 102 mmol/L (ref 98–111)
Creatinine, Ser: 1.09 mg/dL (ref 0.61–1.24)
GFR calc Af Amer: >60 mL/min (ref >60)
GFR calc non Af Amer: >60 mL/min (ref >60)
Glucose, Bld: 158 mg/dL — ABNORMAL HIGH (ref 70–99)
Potassium: 4.4 mmol/L (ref 3.5–5.1)
Sodium: 133 mmol/L — ABNORMAL LOW (ref 135–145)

## 2018-07-02 LAB — URINE CULTURE: Culture: NO GROWTH

## 2018-07-02 LAB — SURGICAL PATHOLOGY

## 2018-07-02 MED ORDER — ENOXAPARIN SODIUM 40 MG/0.4ML ~~LOC~~ SOLN: 40.0000 mg | SUBCUTANEOUS | 0 refills | Status: DC

## 2018-07-02 MED ORDER — OXYCODONE HCL 10 MG PO TABS: 10.0000 mg | ORAL_TABLET | ORAL | 0 refills | Status: AC | PRN

## 2018-07-02 MED ORDER — METOPROLOL TARTRATE 25 MG PO TABS
25.0000 mg | ORAL_TABLET | Freq: Two times a day (BID) | ORAL | Status: DC
  Administered 2018-07-02: 11:00:00 25 mg via ORAL
  Filled 2018-07-02: qty 1

## 2018-07-02 NOTE — Progress Notes (Signed)
  Subjective: 1 Day Post-Op Procedure(s) (LRB): TOTAL HIP ARTHROPLASTY - RIGHT (Right) Patient reports pain as mild.   Patient is well but is hypertensive. Plan is to go Home after hospital stay. Negative for chest pain and shortness of breath Fever: no Gastrointestinal:Negative for nausea and vomiting BP 189/102 this AM.  Objective: Vital signs in last 24 hours: Temp:  [97.4 F (36.3 C)-98.2 F (36.8 C)] 97.4 F (36.3 C) (05/06 0449) Pulse Rate:  [57-86] 57 (05/06 0449) Resp:  [13-20] 20 (05/05 2007) BP: (127-189)/(77-106) 189/102 (05/06 0449) SpO2:  [90 %-100 %] 99 % (05/06 0449)  Intake/Output from previous day:  Intake/Output Summary (Last 24 hours) at 07/02/2018 0754 Last data filed at 07/02/2018 0400 Gross per 24 hour  Intake 2372.92 ml  Output 286 ml  Net 2086.92 ml    Intake/Output this shift: No intake/output data recorded.  Labs: Recent Labs    07/01/18 0612  HGB 15.7   Recent Labs    07/01/18 0612  WBC 11.7*  RBC 4.75  HCT 45.6  PLT 225   Recent Labs    07/01/18 0612 07/02/18 0441  NA 136 133*  K 3.9 4.4  CL 102 102  CO2 22 22  BUN 12 14  CREATININE 1.28* 1.09  GLUCOSE 98 158*  CALCIUM 9.1 8.4*   No results for input(s): LABPT, INR in the last 72 hours.   EXAM General - Patient is Alert, Appropriate and Oriented Extremity - ABD soft Neurovascular intact Sensation intact distally Intact pulses distally Dorsiflexion/Plantar flexion intact Incision: scant drainage No cellulitis present Dressing/Incision -Mild blood tinged drainage Motor Function - intact, moving foot and toes well on exam.  Abdomen is soft with normal BS.  Past Medical History:  Diagnosis Date  . AAA (abdominal aortic aneurysm) without rupture ([place])   . Abdominal aneurysm ([place]) 2015   being followed but not large enough to surgically treat  . Anxiety   . Arthritis   . Asthma   . Benign hypertension 07/16/2014  . Chest pain on exertion 07/16/2014  . Chronic pain  syndrome   . Coccydynia 02/08/2012  . COPD (chronic obstructive pulmonary disease) ([place])    patient smokes  . Coronary artery disease   . 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 Depression   . 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 IBS (irritable bowel syndrome)   . Myocardial infarction ([place]) 2010  . Restless leg syndrome   . Sciatica   . Spinal stenosis of lumbar region   . Stroke ([place]) 2015   no residual symptoms    Assessment/Plan: 1 Day Post-Op Procedure(s) (LRB): TOTAL HIP ARTHROPLASTY - RIGHT (Right) Active Problems:   Status post total hip replacement, right  Estimated body mass index is 22.44 kg/m as calculated from the following:   Height as of this encounter: 5\' 6"  (1.676 m).   Weight as of this encounter: 63 kg. Advance diet Up with therapy D/C IV fluids when tolerating po intake.  Labs reviewed this AM, WBC 11.7. BP 189/102, Currently on Losartan.  Continue to monitor. Up with therapy today. Patient is passing gas without pain. Will check on patient this afternoon, if doing well will plan for discharge home.  DVT Prophylaxis - Lovenox, Foot Pumps and TED hose Weight-Bearing as tolerated to right leg  J. Cameron Proud, PA-C [facility] Orthopaedic Surgery 07/02/2018, 7:54 AM

## 2018-07-02 NOTE — Progress Notes (Signed)
Discharge instructions reviewed with patient. IV removed. Patient in stable condition. He is now in room waiting on his ride.

## 2018-07-02 NOTE — TOC Transition Note (Signed)
Transition of Care [facility]) - CM/SW Discharge Note   Patient Details  Name: Joseph Peters MRN: [id number] Date of Birth: 08-Jan-1956  Transition of Care [facility]) CM/SW Contact:  Su Hilt, RN Phone Number: 07/02/2018, 3:32 PM   Clinical Narrative:    Patient to discharge home with [facility] health [facility], Helene Kelp with [facility] notified NO DME needs, has everything at home including a rolling walker, has transportation with family. Bedside nurse to review DC instructions with the patient   Final next level of care: [place] Barriers to Discharge: Barriers Resolved   Patient Goals and CMS Choice Patient states their goals for this hospitalization and ongoing recovery are:: to go home CMS Medicare.gov Compare Post Acute Care list provided to:: Patient Choice offered to / list presented to : Patient  Discharge Placement                       Discharge Plan and Services   Discharge Planning Services: CM Consult Post Acute Care Choice: Home Health                    HH Arranged: PT [place]: [facility] [employer] Date [place]: 07/02/18 Time [place]: [phone] Representative spoke with at [place]: [place] ([place]) Interventions     Readmission Risk Interventions No flowsheet data found.

## 2018-07-02 NOTE — Progress Notes (Signed)
Physical Therapy Treatment Patient Details Name: Joseph Peters MRN: [id number] DOB: 05/29/1955 Today's Date: 07/02/2018    History of Present Illness  63 year old male with a three week history of acute onset of right hip pain following an apparent fall. Initial x-rays were unremarkable. s/p R THA, WBAT. PMH of englarging AAA, HLD, COPD, stroke, thoracic myelopathy, IBS, HTN, past MI, smoker, shoulder surgery    PT Comments    Patient agreeable and eager for physical therapy, able to mobilize to EOB mod I. Pt performed therapeutic exercises with verbal cues for exercise technique/form, no physical assist needed. Pt able to vocalize 1/3 posterior hip precautions, PT provided education. Pt ambulated ~172ft with RW and CGA. Pt able to progress from step to gait pattern to step through and exhibited improved gait velocity with time as well. Overall the patient demonstrated good progress towards goals this session.     Follow Up Recommendations  Home health PT     Equipment Recommendations  None recommended by PT    Recommendations for Other Services       Precautions / Restrictions Precautions Precautions: Fall;Posterior Hip Restrictions Weight Bearing Restrictions: Yes RLE Weight Bearing: Weight bearing as tolerated    Mobility  Bed Mobility Overal bed mobility: Needs Assistance Bed Mobility: Supine to Sit     Supine to sit: Modified independent (Device/Increase time)     General bed mobility comments: verbal cues for sequencing/adhere to precautions  Transfers Overall transfer level: Needs assistance Equipment used: Rolling walker (2 wheeled) Transfers: Sit to/from Stand Sit to Stand: Supervision;Min guard         General transfer comment: occasional cues for hand placement  Ambulation/Gait Ambulation/Gait assistance: Min guard Gait Distance (Feet): 180 Feet Assistive device: Rolling walker (2 wheeled)       General Gait Details: step to gait pattern,  verbal cues to progress to step through gait, cues for RW management, improved gait velocity noted with time   Stairs             Wheelchair Mobility    Modified Rankin (Stroke Patients Only)       Balance Overall balance assessment: Needs assistance Sitting-balance support: Feet supported Sitting balance-Leahy Scale: Fair       Standing balance-Leahy Scale: Poor                              Cognition Arousal/Alertness: Awake/alert Behavior During Therapy: WFL for tasks assessed/performed Overall Cognitive Status: Within Functional Limits for tasks assessed                                        Exercises Total Joint Exercises Ankle Circles/Pumps: AROM;15 reps;Seated Short Arc Quad: AROM;Right;15 reps;Strengthening Long Arc Quad: AROM;Strengthening;15 reps;Seated    General Comments        Pertinent Vitals/Pain Pain Assessment: 0-10 Pain Score: 2  Pain Location: R hip Pain Descriptors / Indicators: Discomfort Pain Intervention(s): Limited activity within patient's tolerance;Monitored during session    Home Living                      Prior Function            PT Goals (current goals can now be found in the care plan section) Progress towards PT goals: Progressing toward goals    Frequency    BID  PT Plan Current plan remains appropriate;Discharge plan needs to be updated    Co-evaluation              AM-PAC PT "6 Clicks" Mobility   Outcome Measure  Help needed turning from your back to your side while in a flat bed without using bedrails?: A Little Help needed moving from lying on your back to sitting on the side of a flat bed without using bedrails?: A Little Help needed moving to and from a bed to a chair (including a wheelchair)?: A Little Help needed standing up from a chair using your arms (e.g., wheelchair or bedside chair)?: A Little Help needed to walk in hospital room?: A  Little Help needed climbing 3-5 steps with a railing? : A Little 6 Click Score: 18    End of Session Equipment Utilized During Treatment: Gait belt Activity Tolerance: Patient tolerated treatment well Patient left: with SCD's reapplied;in chair;with chair alarm set;with call bell/phone within reach Nurse Communication: Mobility status PT Visit Diagnosis: Unsteadiness on feet (R26.81);Muscle weakness (generalized) (M62.81);History of falling (Z91.81);Difficulty in walking, not elsewhere classified (R26.2);Pain Pain - Right/Left: Right Pain - part of body: Hip     Time: 6606-0045 PT Time Calculation (min) (ACUTE ONLY): 23 min  Charges:  $Gait Training: 8-22 mins $Therapeutic Exercise: 8-22 mins                     Lieutenant Diego PT, DPT 12:49 PM,07/02/18 [phone]

## 2018-07-02 NOTE — TOC Initial Note (Signed)
Transition of Care [facility]) - Initial/Assessment Note    Patient Details  Name: Joseph Peters MRN: [id number] Date of Birth: 1956-02-20  Transition of Care [facility]) CM/SW Contact:    Su Hilt, RN Phone Number: 07/02/2018, 8:41 AM  Clinical Narrative:                 Met with the patient to discuss the DC plan and needs He lives in 2 places, has a house out in [place] and a mobile home in [place], he wiill be staying in the mobile home in [place] after DC for [facility] PT He has a cane and a friend is getting him a walker for home, He was given a choice list for Home health and will speak to his wife and review then let me know of choice to be set up for [facility] PT He sees Dr. Pricilla Riffle as PCP and gets medications with [employer]  is provided by sons and spouse as well as Nephews Patient states that he has no other needs at this time   Expected Discharge Plan: [place] Barriers to Discharge: Continued Medical Work up   Patient Goals and CMS Choice Patient states their goals for this hospitalization and ongoing recovery are:: to go home CMS Medicare.gov Compare Post Acute Care list provided to:: Patient Choice offered to / list presented to : Patient  Expected Discharge Plan and Services Expected Discharge Plan: [place]   Discharge Planning Services: CM Consult Post Acute Care Choice: [place] arrangements for the past 2 months: Single Family Home                           HH Arranged: PT          Prior Living Arrangements/Services Living arrangements for the past 2 months: Single Family Home Lives with:: Spouse Patient language and need for interpreter reviewed:: No Do you feel safe going back to the place where you live?: Yes      Need for Family Participation in Patient Care: No (Comment) Care giver support system in place?: Yes (comment) Current home services: DME(cane) Criminal Activity/Legal Involvement  Pertinent to Current Situation/Hospitalization: No - Comment as needed  Activities of Daily Living Home Assistive Devices/Equipment: Dentures (specify type), Eyeglasses, Walker (specify type), Crutches ADL Screening (condition at time of admission) Patient's cognitive ability adequate to safely complete daily activities?: Yes Is the patient deaf or have difficulty hearing?: No Does the patient have difficulty seeing, even when wearing glasses/contacts?: No Does the patient have difficulty concentrating, remembering, or making decisions?: No Patient able to express need for assistance with ADLs?: No Does the patient have difficulty dressing or bathing?: Yes Independently performs ADLs?: No Communication: Independent Dressing (OT): Needs assistance Is this a change from baseline?: Pre-admission baseline Grooming: Independent Feeding: Independent Toileting: Needs assistance Is this a change from baseline?: Pre-admission baseline Walks in Home: Needs assistance Is this a change from baseline?: Pre-admission baseline Does the patient have difficulty walking or climbing stairs?: Yes Weakness of Legs: Right Weakness of Arms/Hands: None  Permission Sought/Granted Permission sought to share information with : Case Manager Permission granted to share information with : Yes, Verbal Permission Granted              Emotional Assessment Appearance:: Appears stated age Attitude/Demeanor/Rapport: Engaged Affect (typically observed): Accepting Orientation: : Oriented to Self, Oriented to Place, Oriented to  Time, Oriented to Situation  Alcohol / Substance Use: Tobacco Use Psych Involvement: No (comment)  Admission diagnosis:  SEVERE AVASCULAR NECROSIS OF RIGHT HIP Patient Active Problem List   Diagnosis Date Noted  . Status post total hip replacement, right 07/01/2018  . Hyperlipidemia 01/30/2018  . Renal artery stenosis ([place]) 12/05/2015  . 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  Syncope and collapse 12/05/2015  . Essential hypertension 10/15/2015  . COPD exacerbation ([place]) 01/31/2015  . Acute respiratory failure ([place]) 01/31/2015  . AAA (abdominal aortic aneurysm) ([place]) 11/23/2014  . Prostate cancer screening 11/02/2014  . Gross hematuria 11/02/2014  . Chest pain on exertion 07/16/2014  . Benign hypertension 07/16/2014  . 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 Coccydynia 02/08/2012  . 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 Gait disorder 02/08/2012   PCP:  Zeb Comfort, MD Pharmacy:   [facility] [phone] [address] [place] [address] Phone: [phone] Fax: [phone]     Social Determinants of Health (SDOH) Interventions    Readmission Risk Interventions No flowsheet data found.

## 2018-07-02 NOTE — Discharge Summary (Signed)
Physician Discharge Summary  Patient ID: Joseph Peters MRN: [id number] DOB/AGE: 02-29-1955 63 y.o.  Admit date: 07/01/2018 Discharge date: 07/02/2018  Admission Diagnoses:  SEVERE AVASCULAR NECROSIS OF RIGHT HIP  Discharge Diagnoses: Patient Active Problem List   Diagnosis Date Noted  . Status post total hip replacement, right 07/01/2018  . Hyperlipidemia 01/30/2018  . Renal artery stenosis ([place]) 12/05/2015  . 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 Syncope and collapse 12/05/2015  . Essential hypertension 10/15/2015  . COPD exacerbation ([place]) 01/31/2015  . Acute respiratory failure ([place]) 01/31/2015  . AAA (abdominal aortic aneurysm) ([place]) 11/23/2014  . Prostate cancer screening 11/02/2014  . Gross hematuria 11/02/2014  . Chest pain on exertion 07/16/2014  . Benign hypertension 07/16/2014  . 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 Coccydynia 02/08/2012  . 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 Gait disorder 02/08/2012    Past Medical History:  Diagnosis Date  . AAA (abdominal aortic aneurysm) without rupture ([place])   . Abdominal aneurysm ([place]) 2015   being followed but not large enough to surgically treat  . Anxiety   . Arthritis   . Asthma   . Benign hypertension 07/16/2014  . Chest pain on exertion 07/16/2014  . Chronic pain syndrome   . Coccydynia 02/08/2012  . COPD (chronic obstructive pulmonary disease) ([place])    patient smokes  . Coronary artery disease   . 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 Depression   . 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 IBS (irritable bowel syndrome)   . Myocardial infarction ([place]) 2010  . Restless leg syndrome   . Sciatica   . Spinal stenosis of lumbar region   . Stroke ([place]) 2015   no residual symptoms     Transfusion: None.   Consultants (if any):   Discharged Condition: Improved  Hospital Course: ZYIER DYKEMA is an 63 y.o. male who was admitted 07/01/2018 with a  diagnosis of avascular necrosis of the right hip and went to the operating room on 07/01/2018 and underwent the above named procedures.    Surgeries: Procedure(s): TOTAL HIP ARTHROPLASTY - RIGHT on 07/01/2018 Patient tolerated the surgery well. Taken to PACU where she was stabilized and then transferred to the orthopedic floor.  Started on Lovenox 40mg  q 24 hrs. Foot pumps applied bilaterally at 80 mm. Heels elevated on bed with rolled towels. No evidence of DVT. Negative [place]. Physical therapy started on day #1 for gait training and transfer. OT started day #1 for ADL and assisted devices.  Patient's IV and foley were both removed on POD1.  Implants: Biomet press-fit system with a #15 laterally offset Echo femoral stem, a 54 mm acetabular shell with an E-poly hi-wall liner, and a 36 mm ceramic head with a +3 mm neck.  He was given perioperative antibiotics:  Anti-infectives (From admission, onward)   Start     Dose/Rate Route Frequency Ordered Stop   07/01/18 1430  clindamycin (CLEOCIN) IVPB 900 mg     900 mg 100 mL/hr over 30 Minutes Intravenous Every 6 hours 07/01/18 1217 07/02/18 0829   07/01/18 0637  clindamycin (CLEOCIN) 900 MG/50ML IVPB    Note to Pharmacy:  Jola Baptist   : cabinet override      07/01/18 0637 07/01/18 1844   06/30/18 2215  clindamycin (CLEOCIN) IVPB 900 mg     900 mg 100 mL/hr over 30 Minutes Intravenous  Once 06/30/18 2207 07/01/18 0829    .  He was given sequential compression devices, early ambulation, and Lovenox for DVT prophylaxis.  He benefited maximally from the hospital stay and there were no complications.    Recent vital signs:  Vitals:   07/02/18 0449 07/02/18 1045  BP: (!) 189/102 (!) 148/85  Pulse: (!) 57 66  Resp:    Temp: (!) 97.4 F (36.3 C) 97.9 F (36.6 C)  SpO2: 99% 98%    Recent laboratory studies:  Lab Results  Component Value Date   HGB 15.7 07/01/2018   HGB 14.5 06/15/2016   HGB 15.0 05/03/2016   Lab Results   Component Value Date   WBC 11.7 (H) 07/01/2018   PLT 225 07/01/2018   No results found for: INR Lab Results  Component Value Date   NA 133 (L) 07/02/2018   K 4.4 07/02/2018   CL 102 07/02/2018   CO2 22 07/02/2018   BUN 14 07/02/2018   CREATININE 1.09 07/02/2018   GLUCOSE 158 (H) 07/02/2018    Discharge Medications:   Allergies as of 07/02/2018      Reactions   Penicillins Swelling, Rash, Other (See Comments)   Did it involve swelling of the face/tongue/throat, SOB, or low BP? yes Did it involve sudden or severe rash/hives, skin peeling, or any reaction on the inside of your mouth or nose? no Did you need to seek medical attention at a hospital or doctor's office? Yes When did it last happen?years  If all above answers are "NO", may proceed with cephalosporin use.   Lidocaine Other (See Comments)   unsure      Medication List    TAKE these medications   albuterol 108 (90 Base) MCG/ACT inhaler Commonly known as:  VENTOLIN HFA Inhale 2 puffs into the lungs every 4 (four) hours as needed for wheezing or shortness of breath.   albuterol (2.5 MG/3ML) 0.083% nebulizer solution Commonly known as:  PROVENTIL Inhale 2.5 mg into the lungs every 6 (six) hours as needed for wheezing or shortness of breath.   aspirin EC 81 MG tablet Take 81 mg by mouth at bedtime.   celecoxib 200 MG capsule Commonly known as:  CELEBREX Take 200 mg by mouth 2 (two) times daily.   Daliresp 250 MCG Tabs Generic drug:  Roflumilast Take 250 mg by mouth at bedtime.   diazepam 5 MG tablet Commonly known as:  VALIUM Take 5 mg by mouth at bedtime as needed (restless legs).   enoxaparin 40 MG/0.4ML injection Commonly known as:  LOVENOX Inject 0.4 mLs (40 mg total) into the skin daily. Start taking on:  Jul 03, 2018   finasteride 5 MG tablet Commonly known as:  PROSCAR Take 1 tablet (5 mg total) by mouth daily.   FLUoxetine 20 MG capsule Commonly known as:  PROZAC Take 20 mg by mouth 2  (two) times daily.   gabapentin 300 MG capsule Commonly known as:  NEURONTIN Take 900 mg by mouth at bedtime.   losartan 25 MG tablet Commonly known as:  COZAAR Take 25 mg by mouth daily.   montelukast 10 MG tablet Commonly known as:  SINGULAIR Take 10 mg by mouth at bedtime.   Oxycodone HCl 10 MG Tabs Take 1 tablet (10 mg total) by mouth every 4 (four) hours as needed for up to 4 days (pain).   predniSONE 10 MG tablet Commonly known as:  DELTASONE Take 10 mg by mouth daily with breakfast.   simvastatin 40 MG tablet Commonly known as:  ZOCOR Take 40 mg by  mouth at bedtime.   tamsulosin 0.4 MG Caps capsule Commonly known as:  FLOMAX Take 1 capsule (0.4 mg total) by mouth daily after supper.   tiZANidine 4 MG tablet Commonly known as:  ZANAFLEX Take 4 mg by mouth 3 (three) times daily as needed for muscle spasms.   Trelegy Ellipta 100-62.5-25 MCG/INH Aepb Generic drug:  Fluticasone-Umeclidin-Vilant Inhale 1 puff into the lungs at bedtime.            Durable Medical Equipment  (From admission, onward)         Start     Ordered   07/01/18 1218  DME Bedside commode  Once    Question:  Patient needs a bedside commode to treat with the following condition  Answer:  Status post total hip replacement, right   07/01/18 1217   07/01/18 1218  DME 3 n 1  Once     07/01/18 1217   07/01/18 1218  DME Walker rolling  Once    Question:  Patient needs a walker to treat with the following condition  Answer:  Status post total hip replacement, right   07/01/18 1217          Diagnostic Studies: Dg Chest 2 View  Result Date: 07/01/2018 CLINICAL DATA:  63 year old male with preoperative study EXAM: CHEST - 2 VIEW COMPARISON:  01/31/2015, CT 06/17/2017 FINDINGS: Cardiomediastinal silhouette unchanged in size and contour. Prominent right medial sinal border compatible with known ectasias of the thoracic aorta. Stigmata of emphysema, with increased retrosternal airspace, flattened  hemidiaphragms, increased AP diameter, and hyperinflation on the AP view. No confluent airspace disease, pneumothorax, or pleural effusion. Compared to the prior plain film there is mild reticulonodular opacity of the right lung. IMPRESSION: Right-sided reticulonodular opacity superimposed on advanced emphysema, potentially representing bronchopneumonia/bronchitis. Electronically Signed   By: Corrie Mckusick D.O.   On: 07/01/2018 08:39   Mr Lumbar Spine Wo Contrast  Result Date: 06/26/2018 CLINICAL DATA:  Low back pain radiating to the right leg. EXAM: MRI LUMBAR SPINE WITHOUT CONTRAST TECHNIQUE: Multiplanar, multisequence MR imaging of the lumbar spine was performed. No intravenous contrast was administered. COMPARISON:  CT abdomen 12/01/2015 FINDINGS: Segmentation:  5 lumbar type vertebral bodies. Alignment:  Normal Vertebrae:  No fracture or primary bone lesion. Conus medullaris and cauda equina: Conus extends to the L1-2 level. Conus and cauda equina appear normal. Paraspinal and other soft tissues: Chronic right renal atrophy. Infrarenal abdominal aortic aneurysm measuring up to 4.4 cm in AP diameter. This is increased from the CT study of October 2017 when it measured 3.6 cm. This is a fairly rapid enlargement. Greater than 5 mm growth over the past 12 months is associated with an increased risk of aneurysm rupture. Recommend vascular surgery referral/consultation if not already obtained. Aortic aneurysm NOS (ICD10-I71.9) Disc levels: No significant finding at L3-4 or above. Minimal non-compressive disc bulges. L4-5: Shallow disc protrusion slightly more prominent towards the left. Stenosis of both lateral recesses that would have potential to affect either L5 nerve, somewhat more likely on the left. L4 nerves are able to exit the foramina without compression. L5-S1: Endplate osteophytes and bulging of the disc. Mild stenosis of the subarticular lateral recesses and neural foramina which would have some  potential to affect the L5 or S1 nerves. Definite nerve compression is not established. IMPRESSION: Infrarenal abdominal aortic aneurysm increased in the AP dimension from 3.6 cm in October of 2017 to 4.4 cm today. I do not have any interval scan, but it would  seem prudent treat this as rapid enlargement. Greater than 5 mm growth over the past 12 months is associated with an increased risk of aneurysm rupture. Recommend vascular surgery referral/consultation if not already obtained. Aortic aneurysm NOS (ICD10-I71.9) L4-5: Shallow disc protrusion slightly more prominent towards the left. Narrowing of both lateral recesses, slightly more on the left than the right. Some potential to affect either L5 nerve in this location. L5-S1: Disc bulge. Mild stenosis of the lateral recesses and neural foramina which would have some potential to affect the L5 and/or S1 nerves. Definite compression is not established however. Electronically Signed   By: Nelson Chimes M.D.   On: 06/26/2018 14:44   Mr Hip Right Wo Contrast  Result Date: 06/26/2018 CLINICAL DATA:  Right hip pain, low back pain EXAM: MR OF THE RIGHT HIP WITHOUT CONTRAST TECHNIQUE: Multiplanar, multisequence MR imaging was performed. No intravenous contrast was administered. COMPARISON:  None. FINDINGS: Bones: No hip fracture or dislocation. Subchondral serpiginous signal abnormality in the right superior anterior femoral head with severe marrow edema in the femoral head and neck consistent with avascular necrosis. Subchondral fracture of the right superior anterior femoral head. No articular surface collapse. Marrow edema in the superior acetabulum with a subtle linear component concerning for a subchondral insufficiency fracture without displacement or depression. No left hip a vascular necrosis. Normal sacrum and sacroiliac joints. No SI joint widening or erosive changes. Degenerative disc disease with disc height loss at L4-5 and L5-S1. Articular cartilage and  labrum Articular cartilage:  No chondral defect. Labrum: Large right hip joint effusion with mild synovitis. No left hip joint effusion. No SI joint effusion. Joint or bursal effusion Joint effusion:  No hip joint effusion.  No SI joint effusion. Bursae:  No bursa formation. Muscles and tendons Flexors: Normal. Extensors: Normal. Abductors: Normal. Adductors: Normal. Gluteals: Normal. Hamstrings: Normal. Other findings Miscellaneous: No pelvic free fluid. No fluid collection or hematoma. No inguinal lymphadenopathy. No inguinal hernia. IMPRESSION: 1. Avascular necrosis of right femoral head severe marrow edema in the femoral head and neck with a subchondral fracture along the superior anterior aspect and without articular surface collapse. Nondepressed, nondisplaced subchondral insufficiency fracture of right superior acetabulum. Large right hip joint effusion. Electronically Signed   By: Kathreen Devoid   On: 06/26/2018 14:50   Dg Hip Unilat W Or W/o Pelvis 2-3 Views Right  Result Date: 07/01/2018 CLINICAL DATA:  Status post right total hip arthroplasty EXAM: DG HIP (WITH OR WITHOUT PELVIS) 2-3V RIGHT COMPARISON:  06/26/2018 right hip MRI FINDINGS: Status post right total hip arthroplasty with well-positioned right acetabular and right proximal femoral prostheses. No right hip dislocation. Expected soft tissue gas surrounding the right hip. Skin staples lateral to the right hip. No acute osseous fracture. No focal osseous lesions. IMPRESSION: Satisfactory immediate postoperative appearance status post right total hip arthroplasty. Electronically Signed   By: Ilona Sorrel M.D.   On: 07/01/2018 13:44   Disposition: Plan for discharge home this afternoon.  Follow-up Information    Schnier, Dolores Lory, MD Follow up on 08/27/2018.   Specialties:  Vascular Surgery, Cardiology, Radiology, Vascular Surgery Why:  Patient to be seen after 08/27/18 for 6 month AAA and renal duplex. Can see Manufacturing engineer. Seen in  hospital - AAA growth noted.  Contact information: [place] [address] [phone]        Lattie Corns, PA-C Follow up in 14 day(s).   Specialty:  Physician Assistant Why:  Staple Removal. Contact  information: [place] [address] [phone]          Signed: Judson Roch PA-C 07/02/2018, 3:23 PM

## 2018-07-02 NOTE — Evaluation (Signed)
Occupational Therapy Evaluation Patient Details Name: Joseph Peters MRN: [id number] DOB: 01-18-1956 Today's Date: 07/02/2018    History of Present Illness  63 year old male with a three week history of acute onset of right hip pain following an apparent fall. Initial x-rays were unremarkable. s/p R THA, WBAT. PMH of englarging AAA, HLD, COPD, stroke, thoracic myelopathy, IBS, HTN, past MI, smoker, shoulder surgery   Clinical Impression   Pt seen for OT evaluation this date, POD#1 from above surgery. Pt was independent in all mobility and ADLs prior to surgery, however pt endorses at least 12 falls in the past 6 months. Pt is eager to return to PLOF with less pain and improved safety and independence. Pt currently requires min/mod assist for LB dressing while in seated position due to pain and limited AROM of R hip. Pt able to recall 2/3 posterior total hip precautions at start of session and unable to verbalize how to implement during ADL and mobility. Pt instructed in posterior total hip precautions and how to implement, self care skills, and falls prevention strategies. Pt would benefit from additional instruction in self care skills and techniques to help maintain precautions with or without assistive devices to support recall and carryover prior to discharge. Recommend HHOT upon discharge.       Follow Up Recommendations  Home health OT    Equipment Recommendations  (TBD)    Recommendations for Other Services       Precautions / Restrictions Precautions Precautions: Fall;Posterior Hip Precaution Booklet Issued: No Restrictions Weight Bearing Restrictions: Yes RLE Weight Bearing: Weight bearing as tolerated      Mobility Bed Mobility Overal bed mobility: Needs Assistance Bed Mobility: Supine to Sit     Supine to sit: Modified independent (Device/Increase time)     General bed mobility comments: verbal cues for sequencing/adhere to precautions  Transfers Overall  transfer level: Needs assistance Equipment used: Rolling walker (2 wheeled) Transfers: Sit to/from Stand Sit to Stand: Supervision;Min guard         General transfer comment: occasional cues for hand placement    Balance Overall balance assessment: Needs assistance Sitting-balance support: Feet supported Sitting balance-Leahy Scale: Fair       Standing balance-Leahy Scale: Poor                             ADL either performed or assessed with clinical judgement   ADL Overall ADL's : Needs assistance/impaired                                       General ADL Comments: Pt min/mod A for seated LB ADLs 2/2 decreased ROM in R hip. Regular cuing and strategies to promote continued adherence to posterior hip precautions. Otherwise pt at baseline for ADLs at this time.      Vision Baseline Vision/History: Wears glasses Wears Glasses: At all times Patient Visual Report: No change from baseline       Perception     Praxis      Pertinent Vitals/Pain Pain Assessment: 0-10 Pain Score: 2  Pain Location: R hip Pain Descriptors / Indicators: Discomfort Pain Intervention(s): Limited activity within patient's tolerance;Monitored during session     Hand Dominance     Extremity/Trunk Assessment Upper Extremity Assessment Upper Extremity Assessment: Overall WFL for tasks assessed   Lower Extremity Assessment Lower Extremity Assessment: LLE deficits/detail  RLE Deficits / Details: limitations expected s/p R THA       Communication Communication Communication: No difficulties   Cognition Arousal/Alertness: Awake/alert Behavior During Therapy: WFL for tasks assessed/performed Overall Cognitive Status: Within Functional Limits for tasks assessed                                     General Comments       Exercises Total Joint Exercises Ankle Circles/Pumps: AROM;15 reps;Seated Short Arc Quad: AROM;Right;15  reps;Strengthening Long Arc Quad: AROM;Strengthening;15 reps;Seated Other Exercises Other Exercises: Pt educated on falls prevention strategies and provided with reinforcement on prior education on posterior hip precatuions.    Shoulder Instructions      Home Living Family/patient expects to be discharged to:: Private residence Living Arrangements: Spouse/significant other Available Help at Discharge: Family Type of Home: House Home Access: Stairs to enter Technical brewer of Steps: 3 Entrance Stairs-Rails: Can reach both;Left;Right Home Layout: One level     Bathroom Shower/Tub: Occupational psychologist: Handicapped height Bathroom Accessibility: Yes   Home Equipment: Grab bars - toilet;Walker - 2 wheels;Cane - single point;Shower seat - built in;Grab bars - tub/shower;Walker - 4 wheels          Prior Functioning/Environment Level of Independence: Independent        Comments: Pt endorses at least 12 falls in the last 6 months.         OT Problem List: Decreased strength;Impaired balance (sitting and/or standing);Decreased knowledge of precautions;Decreased range of motion;Decreased safety awareness;Decreased activity tolerance;Decreased coordination;Decreased knowledge of use of DME or AE;Impaired sensation      OT Treatment/Interventions: Self-care/ADL training;Therapeutic exercise;Patient/family education;Balance training;Therapeutic activities;DME and/or AE instruction    OT Goals(Current goals can be found in the care plan section) Acute Rehab OT Goals Patient Stated Goal: to go home OT Goal Formulation: With patient Time For Goal Achievement: 07/16/18 Potential to Achieve Goals: Good ADL Goals Pt Will Perform Lower Body Bathing: with set-up;with supervision;sit to/from stand(With LRAD PRN for safety and adherence to hip precautions.) Pt Will Perform Lower Body Dressing: with set-up;with supervision;with adaptive equipment;sit to/from stand(With  LRAD PRN for safety and adherence to hip precautions.) Pt Will Transfer to Toilet: with set-up;ambulating;bedside commode(With LRAD PRN for safety and adherence to hip precautions.)  OT Frequency: Min 1X/week   Barriers to D/C: Inaccessible home environment          Co-evaluation              AM-PAC OT "6 Clicks" Daily Activity     Outcome Measure Help from another person eating meals?: None Help from another person taking care of personal grooming?: None Help from another person toileting, which includes using toliet, bedpan, or urinal?: A Little Help from another person bathing (including washing, rinsing, drying)?: A Lot Help from another person to put on and taking off regular upper body clothing?: None Help from another person to put on and taking off regular lower body clothing?: A Lot 6 Click Score: 19   End of Session Equipment Utilized During Treatment: Gait belt;Rolling walker  Activity Tolerance: Patient tolerated treatment well Patient left: in chair;with call bell/phone within reach;with chair alarm set;with SCD's reapplied  OT Visit Diagnosis: Other abnormalities of gait and mobility (R26.89);Repeated falls (R29.6);History of falling (Z91.81);Pain Pain - Right/Left: Right Pain - part of body: Hip  Time: 5956-3875 OT Time Calculation (min): 15 min Charges:  OT General Charges $OT Visit: 1 Visit OT Evaluation $OT Eval Low Complexity: 1 Low OT Treatments $Self Care/Home Management : 8-22 mins  Shara Blazing, M.S., OTR/L Ascom: [phone] 07/02/18, 1:59 PM

## 2018-07-02 NOTE — Discharge Instructions (Signed)
Instructions after Total Hip Replacement     J. Jeffrey Poggi, M.D.  J. Lance Djeneba Barsch, PA-C     Dept. of Orthopaedics & Sports Medicine  [facility]  [address]  [place], [place]  [address]  Phone: [phone]   Fax: [phone]    DIET: . Drink plenty of non-alcoholic fluids. . Resume your normal diet. Include foods high in fiber.  ACTIVITY:  . You may use crutches or a walker with weight-bearing as tolerated, unless instructed otherwise. . You may be weaned off of the walker or crutches by your Physical Therapist.  . Do NOT reach below the level of your knees or cross your legs until allowed.    . Continue doing gentle exercises. Exercising will reduce the pain and swelling, increase motion, and prevent muscle weakness.   . Please continue to use the TED compression stockings for 6 weeks. You may remove the stockings at night, but should reapply them in the morning. . Do not drive or operate any equipment until instructed.  WOUND CARE:  . Continue to use ice packs periodically to reduce pain and swelling. . Keep the incision clean and dry. . You may bathe or shower after the staples are removed at the first office visit following surgery.  MEDICATIONS: . You may resume your regular medications. . Please take the pain medication as prescribed on the medication. . Do not take pain medication on an empty stomach. . You have been given a prescription for a blood thinner to prevent blood clots. Please take the medication as instructed. (NOTE: After completing a 2 week course of Lovenox, take one Enteric-coated aspirin once a day.) . Pain medications and iron supplements can cause constipation. Use a stool softener (Senokot or Colace) on a daily basis and a laxative (dulcolax or miralax) as needed. . Do not drive or drink alcoholic beverages when taking pain medications.  CALL THE OFFICE FOR: . Temperature above 101 degrees . Excessive bleeding or drainage on the  dressing. . Excessive swelling, coldness, or paleness of the toes. . Persistent nausea and vomiting.  FOLLOW-UP:  . You should have an appointment to return to the office in 2 weeks after surgery. . Arrangements have been made for continuation of Physical Therapy (either home therapy or outpatient therapy).  

## 2018-07-02 NOTE — TOC Progression Note (Signed)
Transition of Care [facility]) - Progression Note    Patient Details  Name: Joseph Peters MRN: [id number] Date of Birth: 1955-12-04  Transition of Care [facility]) CM/SW Contact  Su Hilt, RN Phone Number: 07/02/2018, 9:29 AM  Clinical Narrative:    Patient will use [place] with [facility] Notified   Expected Discharge Plan: [place] Barriers to Discharge: Continued Medical Work up  Expected Discharge Plan and Services Expected Discharge Plan: [place]   Discharge Planning Services: CM Consult Post Acute Care Choice: [place] arrangements for the past 2 months: Single Family Home                           HH Arranged: PT [place]: [facility] [employer] Date [place]: 07/02/18 Time [place]: [phone] Representative spoke with at [place]: [place] Interventions    Readmission Risk Interventions No flowsheet data found.

## 2018-07-02 NOTE — Anesthesia Postprocedure Evaluation (Signed)
Anesthesia Post Note  Patient: BEE HAMMERSCHMIDT  Procedure(s) Performed: TOTAL HIP ARTHROPLASTY - RIGHT (Right Hip)  Patient location during evaluation: Nursing Unit Anesthesia Type: Spinal Level of consciousness: awake, awake and alert, patient cooperative and oriented Pain management: pain level controlled Vital Signs Assessment: post-procedure vital signs reviewed and stable Respiratory status: spontaneous breathing, nonlabored ventilation and respiratory function stable Cardiovascular status: stable Postop Assessment: no headache, no backache, adequate PO intake, no apparent nausea or vomiting and patient able to bend at knees Anesthetic complications: no     Last Vitals:  Vitals:   07/01/18 2007 07/02/18 0449  BP: (!) 171/106 (!) 189/102  Pulse: 65 (!) 57  Resp: 20   Temp: (!) 36.4 C (!) 36.3 C  SpO2: 100% 99%    Last Pain:  Vitals:   07/02/18 0449  TempSrc: Oral  PainSc:                  Ricki Miller

## 2018-07-02 NOTE — Progress Notes (Signed)
Physical Therapy Treatment Patient Details Name: Joseph Peters MRN: [id number] DOB: [birth year] Today's Date: 07/02/2018    History of Present Illness  63 year old male with a three week history of acute onset of right hip pain following an apparent fall. Initial x-rays were unremarkable. s/p R THA, WBAT. PMH of englarging AAA, HLD, COPD, stroke, thoracic myelopathy, IBS, HTN, past MI, smoker, shoulder surgery    PT Comments    Patient agreeable PT, pain minimal in RLE. Session focused on functional mobility. Pt able to ambulate >579ft with RW and supervision/Mod I, improved gait quality with less verbal cues needed. Pt also demonstrated stair navigation with bilateral rails with supervision. Intermittent cues for sequencing. Sit <> stand mod I with RW as well. Pt educated on precautions and instructed to refer to packet in room for continued education. The patient demonstrated improvement towards goals.     Follow Up Recommendations  Home health PT     Equipment Recommendations  None recommended by PT    Recommendations for Other Services       Precautions / Restrictions Precautions Precautions: Fall;Posterior Hip Precaution Booklet Issued: Yes (comment) Restrictions Weight Bearing Restrictions: Yes RLE Weight Bearing: Weight bearing as tolerated    Mobility  Bed Mobility Overal bed mobility: Modified Independent Bed Mobility: Supine to Sit     Supine to sit: Modified independent (Device/Increase time)     General bed mobility comments: verbal cues for sequencing/adhere to precautions  Transfers Overall transfer level: Modified independent Equipment used: Rolling walker (2 wheeled) Transfers: Sit to/from Stand Sit to Stand: Modified independent (Device/Increase time)         General transfer comment: occasional cues for hand placement  Ambulation/Gait Ambulation/Gait assistance: Min guard Gait Distance (Feet): 500 Feet Assistive device: Rolling walker (2  wheeled)       General Gait Details: step through gait pattern, occasional verbal cues for upright posture   Stairs Stairs: Yes Stairs assistance: Min guard;Supervision Stair Management: Two rails;Step to pattern Number of Stairs: 4     Wheelchair Mobility    Modified Rankin (Stroke Patients Only)       Balance Overall balance assessment: Needs assistance Sitting-balance support: Feet supported Sitting balance-Leahy Scale: Fair       Standing balance-Leahy Scale: Fair                              Cognition Arousal/Alertness: Awake/alert Behavior During Therapy: WFL for tasks assessed/performed Overall Cognitive Status: Within Functional Limits for tasks assessed                                        Exercises Total Joint Exercises Ankle Circles/Pumps: AROM;15 reps;Seated Short Arc Quad: AROM;Right;15 reps;Strengthening Long Arc Quad: AROM;Strengthening;15 reps;Seated Other Exercises Other Exercises: Pt educated on falls prevention strategies and provided with reinforcement on prior education on posterior hip precatuions.     General Comments        Pertinent Vitals/Pain Pain Assessment: 0-10 Pain Score: 2  Pain Location: R hip Pain Descriptors / Indicators: Discomfort;Sore Pain Intervention(s): Limited activity within patient's tolerance;Monitored during session;Premedicated before session    [place] expects to be discharged to:: Private residence Living Arrangements: Spouse/significant other Available Help at Discharge: Family Type of Home: House Home Access: Stairs to enter Entrance Stairs-Rails: Can reach both;Left;Right Home Layout: One level Home Equipment: Grab  bars - toilet;Walker - 2 wheels;Cane - single point;Shower seat - built in;Grab bars - tub/shower;Walker - 4 wheels      Prior Function Level of Independence: Independent      Comments: Pt endorses at least 12 falls in the last 6  months.    PT Goals (current goals can now be found in the care plan section) Acute Rehab PT Goals Patient Stated Goal: to go home Progress towards PT goals: Progressing toward goals    Frequency    BID      PT Plan Current plan remains appropriate    Co-evaluation              AM-PAC PT "6 Clicks" Mobility   Outcome Measure  Help needed turning from your back to your side while in a flat bed without using bedrails?: None Help needed moving from lying on your back to sitting on the side of a flat bed without using bedrails?: None Help needed moving to and from a bed to a chair (including a wheelchair)?: None Help needed standing up from a chair using your arms (e.g., wheelchair or bedside chair)?: None Help needed to walk in hospital room?: A Little Help needed climbing 3-5 steps with a railing? : A Little 6 Click Score: 22    End of Session Equipment Utilized During Treatment: Gait belt Activity Tolerance: Patient tolerated treatment well Patient left: in chair;with chair alarm set;with call bell/phone within reach Nurse Communication: Mobility status PT Visit Diagnosis: Unsteadiness on feet (R26.81);Muscle weakness (generalized) (M62.81);History of falling (Z91.81);Difficulty in walking, not elsewhere classified (R26.2);Pain Pain - Right/Left: Right Pain - part of body: Hip     Time: 6073-7106 PT Time Calculation (min) (ACUTE ONLY): 27 min  Charges:  $Gait Training: 8-22 mins $Therapeutic Exercise: 8-22 mins $Therapeutic Activity: 8-22 mins                     Lieutenant Diego PT, DPT 3:44 PM,07/02/18 [phone]

## 2018-07-06 LAB — AEROBIC/ANAEROBIC CULTURE W GRAM STAIN (SURGICAL/DEEP WOUND): Culture: NO GROWTH

## 2018-07-06 LAB — AEROBIC/ANAEROBIC CULTURE (SURGICAL/DEEP WOUND)

## 2018-07-07 ENCOUNTER — Encounter: Payer: Self-pay | Admitting: Surgery

## 2018-07-29 ENCOUNTER — Ambulatory Visit: Payer: Medicare Other

## 2018-08-11 ENCOUNTER — Other Ambulatory Visit: Payer: Self-pay | Admitting: Cardiology

## 2018-08-11 ENCOUNTER — Other Ambulatory Visit (HOSPITAL_COMMUNITY): Payer: Self-pay | Admitting: Cardiology

## 2018-08-11 DIAGNOSIS — I7121 Aneurysm of the ascending aorta, without rupture: Secondary | ICD-10-CM

## 2018-08-11 DIAGNOSIS — I712 Thoracic aortic aneurysm, without rupture: Secondary | ICD-10-CM

## 2018-08-13 ENCOUNTER — Other Ambulatory Visit: Payer: Self-pay

## 2018-08-13 ENCOUNTER — Ambulatory Visit
Admission: RE | Admit: 2018-08-13 | Discharge: 2018-08-13 | Disposition: A | Discharge status: Home / Self Care | Payer: Medicare Other | Source: Ambulatory Visit | Attending: Cardiology | Admitting: Cardiology

## 2018-08-13 DIAGNOSIS — I7121 Aneurysm of the ascending aorta, without rupture: Secondary | ICD-10-CM

## 2018-08-13 DIAGNOSIS — I712 Thoracic aortic aneurysm, without rupture: Secondary | ICD-10-CM

## 2018-08-13 MED ORDER — IOPAMIDOL (ISOVUE-370) INJECTION 76%
75.0000 mL | Freq: Once | INTRAVENOUS | Status: AC | PRN
  Administered 2018-08-13: 75 mL via INTRAVENOUS

## 2018-08-21 ENCOUNTER — Other Ambulatory Visit: Payer: Self-pay | Admitting: Specialist

## 2018-08-21 DIAGNOSIS — R0609 Other forms of dyspnea: Secondary | ICD-10-CM

## 2018-08-21 DIAGNOSIS — R911 Solitary pulmonary nodule: Secondary | ICD-10-CM

## 2018-09-01 ENCOUNTER — Other Ambulatory Visit: Payer: Self-pay

## 2018-09-01 ENCOUNTER — Ambulatory Visit
Admission: RE | Admit: 2018-09-01 | Discharge: 2018-09-01 | Disposition: A | Discharge status: Home / Self Care | Payer: Medicare Other | Source: Ambulatory Visit | Attending: Specialist | Admitting: Specialist

## 2018-09-01 DIAGNOSIS — I714 Abdominal aortic aneurysm, without rupture: Secondary | ICD-10-CM | POA: Diagnosis not present

## 2018-09-01 DIAGNOSIS — J439 Emphysema, unspecified: Secondary | ICD-10-CM | POA: Insufficient documentation

## 2018-09-01 DIAGNOSIS — R0600 Dyspnea, unspecified: Secondary | ICD-10-CM | POA: Insufficient documentation

## 2018-09-01 DIAGNOSIS — I700 Atherosclerosis of aorta: Secondary | ICD-10-CM | POA: Insufficient documentation

## 2018-09-01 DIAGNOSIS — R911 Solitary pulmonary nodule: Secondary | ICD-10-CM | POA: Diagnosis not present

## 2018-09-01 DIAGNOSIS — Z87891 Personal history of nicotine dependence: Secondary | ICD-10-CM | POA: Insufficient documentation

## 2018-09-01 DIAGNOSIS — R0609 Other forms of dyspnea: Secondary | ICD-10-CM

## 2018-09-01 LAB — GLUCOSE, CAPILLARY: Glucose-Capillary: 88 mg/dL (ref 70–99)

## 2018-09-01 MED ORDER — FLUDEOXYGLUCOSE F - 18 (FDG) INJECTION
7.5700 | Freq: Once | INTRAVENOUS | Status: AC | PRN
  Administered 2018-09-01: 7.57 via INTRAVENOUS

## 2018-10-07 ENCOUNTER — Inpatient Hospital Stay: Admission: RE | Admit: 2018-10-07 | Payer: Medicare Other | Source: Ambulatory Visit

## 2018-10-10 ENCOUNTER — Encounter: Admission: RE | Payer: Self-pay | Source: Home / Self Care

## 2018-10-10 ENCOUNTER — Ambulatory Visit: Admission: RE | Admit: 2018-10-10 | Payer: Medicare Other | Source: Home / Self Care

## 2018-10-10 SURGERY — BRONCHOSCOPY, WITH EBUS
Anesthesia: General

## 2018-10-14 ENCOUNTER — Other Ambulatory Visit: Payer: Self-pay

## 2018-10-14 ENCOUNTER — Observation Stay: Payer: Medicare Other

## 2018-10-14 ENCOUNTER — Inpatient Hospital Stay
Admission: EM | Admit: 2018-10-14 | Discharge: 2018-10-17 | DRG: 193 | Disposition: A | Discharge status: Home / Self Care | Payer: Medicare Other | Attending: Internal Medicine | Admitting: Internal Medicine

## 2018-10-14 ENCOUNTER — Emergency Department: Payer: Medicare Other

## 2018-10-14 DIAGNOSIS — C3411 Malignant neoplasm of upper lobe, right bronchus or lung: Secondary | ICD-10-CM | POA: Diagnosis present

## 2018-10-14 DIAGNOSIS — G44009 Cluster headache syndrome, unspecified, not intractable: Secondary | ICD-10-CM | POA: Diagnosis present

## 2018-10-14 DIAGNOSIS — J189 Pneumonia, unspecified organism: Principal | ICD-10-CM | POA: Diagnosis present

## 2018-10-14 DIAGNOSIS — Z7982 Long term (current) use of aspirin: Secondary | ICD-10-CM

## 2018-10-14 DIAGNOSIS — J440 Chronic obstructive pulmonary disease with acute lower respiratory infection: Secondary | ICD-10-CM | POA: Diagnosis present

## 2018-10-14 DIAGNOSIS — Z681 Body mass index (BMI) 19 or less, adult: Secondary | ICD-10-CM

## 2018-10-14 DIAGNOSIS — G2581 Restless legs syndrome: Secondary | ICD-10-CM | POA: Diagnosis present

## 2018-10-14 DIAGNOSIS — R911 Solitary pulmonary nodule: Secondary | ICD-10-CM

## 2018-10-14 DIAGNOSIS — J441 Chronic obstructive pulmonary disease with (acute) exacerbation: Secondary | ICD-10-CM | POA: Diagnosis present

## 2018-10-14 DIAGNOSIS — K589 Irritable bowel syndrome without diarrhea: Secondary | ICD-10-CM | POA: Diagnosis present

## 2018-10-14 DIAGNOSIS — I714 Abdominal aortic aneurysm, without rupture, unspecified: Secondary | ICD-10-CM

## 2018-10-14 DIAGNOSIS — R1031 Right lower quadrant pain: Secondary | ICD-10-CM | POA: Diagnosis present

## 2018-10-14 DIAGNOSIS — Z20828 Contact with and (suspected) exposure to other viral communicable diseases: Secondary | ICD-10-CM | POA: Diagnosis present

## 2018-10-14 DIAGNOSIS — I252 Old myocardial infarction: Secondary | ICD-10-CM

## 2018-10-14 DIAGNOSIS — E785 Hyperlipidemia, unspecified: Secondary | ICD-10-CM | POA: Diagnosis present

## 2018-10-14 DIAGNOSIS — F419 Anxiety disorder, unspecified: Secondary | ICD-10-CM | POA: Diagnosis present

## 2018-10-14 DIAGNOSIS — G894 Chronic pain syndrome: Secondary | ICD-10-CM | POA: Diagnosis present

## 2018-10-14 DIAGNOSIS — M48061 Spinal stenosis, lumbar region without neurogenic claudication: Secondary | ICD-10-CM | POA: Diagnosis present

## 2018-10-14 DIAGNOSIS — Z96641 Presence of right artificial hip joint: Secondary | ICD-10-CM | POA: Diagnosis present

## 2018-10-14 DIAGNOSIS — E43 Unspecified severe protein-calorie malnutrition: Secondary | ICD-10-CM | POA: Diagnosis present

## 2018-10-14 DIAGNOSIS — I10 Essential (primary) hypertension: Secondary | ICD-10-CM | POA: Diagnosis present

## 2018-10-14 DIAGNOSIS — F17210 Nicotine dependence, cigarettes, uncomplicated: Secondary | ICD-10-CM | POA: Diagnosis present

## 2018-10-14 DIAGNOSIS — Z880 Allergy status to penicillin: Secondary | ICD-10-CM

## 2018-10-14 DIAGNOSIS — Z79899 Other long term (current) drug therapy: Secondary | ICD-10-CM

## 2018-10-14 DIAGNOSIS — I2510 Atherosclerotic heart disease of native coronary artery without angina pectoris: Secondary | ICD-10-CM | POA: Diagnosis present

## 2018-10-14 DIAGNOSIS — H5711 Ocular pain, right eye: Secondary | ICD-10-CM | POA: Diagnosis present

## 2018-10-14 DIAGNOSIS — Z888 Allergy status to other drugs, medicaments and biological substances status: Secondary | ICD-10-CM

## 2018-10-14 DIAGNOSIS — R0602 Shortness of breath: Secondary | ICD-10-CM

## 2018-10-14 DIAGNOSIS — Z8673 Personal history of transient ischemic attack (TIA), and cerebral infarction without residual deficits: Secondary | ICD-10-CM

## 2018-10-14 DIAGNOSIS — F329 Major depressive disorder, single episode, unspecified: Secondary | ICD-10-CM | POA: Diagnosis present

## 2018-10-14 DIAGNOSIS — Z825 Family history of asthma and other chronic lower respiratory diseases: Secondary | ICD-10-CM

## 2018-10-14 LAB — URINALYSIS, COMPLETE (UACMP) WITH MICROSCOPIC
Bacteria, UA: NONE SEEN
Bilirubin Urine: NEGATIVE
Glucose, UA: NEGATIVE mg/dL
Hgb urine dipstick: NEGATIVE
Ketones, ur: 5 mg/dL — AB
Leukocytes,Ua: NEGATIVE
Nitrite: NEGATIVE
Protein, ur: NEGATIVE mg/dL
Specific Gravity, Urine: 1.036 — ABNORMAL HIGH (ref 1.005–1.030)
pH: 6 (ref 5.0–8.0)

## 2018-10-14 LAB — CBC
HCT: 44.4 % (ref 39.0–52.0)
Hemoglobin: 14.9 g/dL (ref 13.0–17.0)
MCH: 30.7 pg (ref 26.0–34.0)
MCHC: 33.6 g/dL (ref 30.0–36.0)
MCV: 91.4 fL (ref 80.0–100.0)
Platelets: 303 10*3/uL (ref 150–400)
RBC: 4.86 MIL/uL (ref 4.22–5.81)
RDW: 12.5 % (ref 11.5–15.5)
WBC: 11.9 10*3/uL — ABNORMAL HIGH (ref 4.0–10.5)
nRBC: 0 % (ref 0.0–0.2)

## 2018-10-14 LAB — COMPREHENSIVE METABOLIC PANEL
ALT: 10 U/L (ref 0–44)
AST: 18 U/L (ref 15–41)
Albumin: 3.3 g/dL — ABNORMAL LOW (ref 3.5–5.0)
Alkaline Phosphatase: 94 U/L (ref 38–126)
Anion gap: 9 (ref 5–15)
BUN: 7 mg/dL — ABNORMAL LOW (ref 8–23)
CO2: 24 mmol/L (ref 22–32)
Calcium: 9.1 mg/dL (ref 8.9–10.3)
Chloride: 101 mmol/L (ref 98–111)
Creatinine, Ser: 1.03 mg/dL (ref 0.61–1.24)
GFR calc Af Amer: >60 mL/min (ref >60)
GFR calc non Af Amer: >60 mL/min (ref >60)
Glucose, Bld: 103 mg/dL — ABNORMAL HIGH (ref 70–99)
Potassium: 4.4 mmol/L (ref 3.5–5.1)
Sodium: 134 mmol/L — ABNORMAL LOW (ref 135–145)
Total Bilirubin: 1 mg/dL (ref 0.3–1.2)
Total Protein: 7.3 g/dL (ref 6.5–8.1)

## 2018-10-14 LAB — PROCALCITONIN: Procalcitonin: <0.1 ng/mL

## 2018-10-14 LAB — LIPASE, BLOOD: Lipase: 23 U/L (ref 11–51)

## 2018-10-14 LAB — SARS CORONAVIRUS 2 BY RT PCR (HOSPITAL ORDER, PERFORMED IN ~~LOC~~ HOSPITAL LAB): SARS Coronavirus 2: NEGATIVE

## 2018-10-14 LAB — SARS CORONAVIRUS 2 (TAT 6-24 HRS): SARS Coronavirus 2: NEGATIVE

## 2018-10-14 LAB — STREP PNEUMONIAE URINARY ANTIGEN: Strep Pneumo Urinary Antigen: NEGATIVE

## 2018-10-14 MED ORDER — ONDANSETRON HCL 4 MG/2ML IJ SOLN
4.0000 mg | Freq: Once | INTRAMUSCULAR | Status: AC
  Administered 2018-10-14: 15:00:00 4 mg via INTRAVENOUS
  Filled 2018-10-14 (×2): qty 2

## 2018-10-14 MED ORDER — PANTOPRAZOLE SODIUM 40 MG PO TBEC
40.0000 mg | DELAYED_RELEASE_TABLET | Freq: Every day | ORAL | Status: DC
  Administered 2018-10-15 – 2018-10-17 (×3): 40 mg via ORAL
  Filled 2018-10-14 (×3): qty 1

## 2018-10-14 MED ORDER — METOPROLOL TARTRATE 5 MG/5ML IV SOLN
INTRAVENOUS | Status: AC
  Administered 2018-10-14: 5 mg via INTRAVENOUS
  Filled 2018-10-14: qty 5

## 2018-10-14 MED ORDER — SIMVASTATIN 40 MG PO TABS
40.0000 mg | ORAL_TABLET | Freq: Every day | ORAL | Status: DC
  Administered 2018-10-15 – 2018-10-16 (×3): 40 mg via ORAL
  Filled 2018-10-14 (×5): qty 1

## 2018-10-14 MED ORDER — METHYLPREDNISOLONE SODIUM SUCC 125 MG IJ SOLR
125.0000 mg | Freq: Once | INTRAMUSCULAR | Status: AC
  Administered 2018-10-14: 15:00:00 125 mg via INTRAVENOUS
  Filled 2018-10-14: qty 2

## 2018-10-14 MED ORDER — TIZANIDINE HCL 4 MG PO TABS
4.0000 mg | ORAL_TABLET | Freq: Three times a day (TID) | ORAL | Status: DC | PRN
  Administered 2018-10-16: 4 mg via ORAL
  Filled 2018-10-14 (×3): qty 1

## 2018-10-14 MED ORDER — NICOTINE 21 MG/24HR TD PT24
21.0000 mg | MEDICATED_PATCH | Freq: Every day | TRANSDERMAL | Status: DC
  Administered 2018-10-14 – 2018-10-16 (×3): 21 mg via TRANSDERMAL
  Filled 2018-10-14 (×3): qty 1

## 2018-10-14 MED ORDER — FLUOXETINE HCL 20 MG PO CAPS
20.0000 mg | ORAL_CAPSULE | Freq: Every day | ORAL | Status: DC
  Administered 2018-10-15 – 2018-10-17 (×3): 20 mg via ORAL
  Filled 2018-10-14 (×3): qty 1

## 2018-10-14 MED ORDER — GABAPENTIN 300 MG PO CAPS
900.0000 mg | ORAL_CAPSULE | Freq: Every day | ORAL | Status: DC
  Administered 2018-10-14 – 2018-10-16 (×3): 900 mg via ORAL
  Filled 2018-10-14 (×4): qty 3

## 2018-10-14 MED ORDER — IPRATROPIUM-ALBUTEROL 0.5-2.5 (3) MG/3ML IN SOLN
3.0000 mL | Freq: Four times a day (QID) | RESPIRATORY_TRACT | Status: DC
  Administered 2018-10-14 – 2018-10-16 (×9): 3 mL via RESPIRATORY_TRACT
  Filled 2018-10-14 (×8): qty 3

## 2018-10-14 MED ORDER — FINASTERIDE 5 MG PO TABS
5.0000 mg | ORAL_TABLET | Freq: Every day | ORAL | Status: DC
  Administered 2018-10-15 – 2018-10-17 (×3): 5 mg via ORAL
  Filled 2018-10-14 (×3): qty 1

## 2018-10-14 MED ORDER — OXYCODONE HCL 5 MG PO TABS
10.0000 mg | ORAL_TABLET | Freq: Four times a day (QID) | ORAL | Status: DC | PRN
  Administered 2018-10-14 – 2018-10-15 (×2): 10 mg via ORAL
  Filled 2018-10-14 (×2): qty 2

## 2018-10-14 MED ORDER — ALBUTEROL SULFATE (2.5 MG/3ML) 0.083% IN NEBU
5.0000 mg | INHALATION_SOLUTION | Freq: Once | RESPIRATORY_TRACT | Status: AC
  Administered 2018-10-14: 15:00:00 5 mg via RESPIRATORY_TRACT
  Filled 2018-10-14: qty 6

## 2018-10-14 MED ORDER — ENOXAPARIN SODIUM 40 MG/0.4ML ~~LOC~~ SOLN
40.0000 mg | SUBCUTANEOUS | Status: DC
  Administered 2018-10-14 – 2018-10-16 (×3): 40 mg via SUBCUTANEOUS
  Filled 2018-10-14 (×4): qty 0.4

## 2018-10-14 MED ORDER — MONTELUKAST SODIUM 10 MG PO TABS
10.0000 mg | ORAL_TABLET | Freq: Every day | ORAL | Status: DC
  Administered 2018-10-14 – 2018-10-16 (×3): 10 mg via ORAL
  Filled 2018-10-14 (×3): qty 1

## 2018-10-14 MED ORDER — IPRATROPIUM-ALBUTEROL 0.5-2.5 (3) MG/3ML IN SOLN
RESPIRATORY_TRACT | Status: AC
  Filled 2018-10-14: qty 3

## 2018-10-14 MED ORDER — PREDNISONE 20 MG PO TABS
40.0000 mg | ORAL_TABLET | Freq: Every day | ORAL | Status: DC
  Administered 2018-10-15 – 2018-10-17 (×3): 40 mg via ORAL
  Filled 2018-10-14 (×3): qty 2

## 2018-10-14 MED ORDER — MORPHINE SULFATE (PF) 4 MG/ML IV SOLN
4.0000 mg | Freq: Once | INTRAVENOUS | Status: AC
  Administered 2018-10-14: 4 mg via INTRAVENOUS
  Filled 2018-10-14 (×2): qty 1

## 2018-10-14 MED ORDER — METOPROLOL TARTRATE 5 MG/5ML IV SOLN
5.0000 mg | Freq: Once | INTRAVENOUS | Status: AC
  Administered 2018-10-14: 12:00:00 5 mg via INTRAVENOUS

## 2018-10-14 MED ORDER — HYDRALAZINE HCL 20 MG/ML IJ SOLN
10.0000 mg | Freq: Once | INTRAMUSCULAR | Status: AC
  Administered 2018-10-14: 13:00:00 10 mg via INTRAVENOUS
  Filled 2018-10-14: qty 1

## 2018-10-14 MED ORDER — LEVOFLOXACIN IN D5W 750 MG/150ML IV SOLN
750.0000 mg | INTRAVENOUS | Status: DC
  Administered 2018-10-15 – 2018-10-16 (×3): 750 mg via INTRAVENOUS
  Filled 2018-10-14 (×4): qty 150

## 2018-10-14 MED ORDER — LOSARTAN POTASSIUM 25 MG PO TABS
25.0000 mg | ORAL_TABLET | Freq: Every day | ORAL | Status: DC
  Administered 2018-10-15 – 2018-10-17 (×4): 25 mg via ORAL
  Filled 2018-10-14 (×4): qty 1

## 2018-10-14 MED ORDER — TAMSULOSIN HCL 0.4 MG PO CAPS
0.4000 mg | ORAL_CAPSULE | Freq: Every day | ORAL | Status: DC
  Administered 2018-10-15 – 2018-10-16 (×2): 0.4 mg via ORAL
  Filled 2018-10-14 (×2): qty 1

## 2018-10-14 MED ORDER — IOHEXOL 350 MG/ML SOLN
75.0000 mL | Freq: Once | INTRAVENOUS | Status: AC | PRN
  Administered 2018-10-14: 75 mL via INTRAVENOUS

## 2018-10-14 MED ORDER — SODIUM CHLORIDE 0.9 % IV BOLUS
1000.0000 mL | Freq: Once | INTRAVENOUS | Status: AC
  Administered 2018-10-14: 1000 mL via INTRAVENOUS

## 2018-10-14 MED ORDER — DIAZEPAM 5 MG PO TABS
5.0000 mg | ORAL_TABLET | Freq: Every evening | ORAL | Status: DC | PRN
  Administered 2018-10-15: 5 mg via ORAL
  Filled 2018-10-14: qty 1

## 2018-10-14 NOTE — ED Notes (Signed)
EDP to bedside; update provided.

## 2018-10-14 NOTE — ED Notes (Signed)
Pt to resume taking his home BP meds when he gets home.

## 2018-10-14 NOTE — ED Provider Notes (Addendum)
----------------------------------------- 8:27 AM on 10/14/2018 -----------------------------------------  Signed out to me at 745, CT scan pending if CT scan reassuring, patient feels better likely could be discharged per Dr. Ellender Hose.  I did visit with the patient his abdomen is completely benign he has no complaints he is resting comfortably.  Quite well at this moment   Joseph Amor, MD 10/14/18 0827  ----------------------------------------- 11:06 AM on 10/14/2018 -----------------------------------------  Enlargement of AAA in CT scan noted since 2017, unclear the timeframe for that enlargement.  Patient has excellent pulses.  Does continue to smoke.  Abdomen is benign and nontender since I met him.  I did talk to the patient but admission but he declines insisting that he would prefer to go home.  Also made the patient aware of all findings on CT and blood work understands risk-benefit and alternatives to going home or being admitted.  I did talk to Dr. Lucky Cowboy, the vascular surgeon.  Appreciate consult.  Do feels the patient is safe for outpatient discharge after reviewing the CT scan.  He will see him in a day or 2 and repair his aorta.  I will send a coronavirus on the patient as an outpatient to see if I can help expedite this procedure.  Dr. dew does not feel that he should be changed from his aspirin he does not want to add any other medications or subtract from what he is already taking.  Patient has no tenderness no pain and no symptoms and feels under percent well CT scan does not betray any other significant pathology requiring surgical intervention and patient refuses to stay in the hospital.  I will keep the patient here for a urinalysis, and then we will try to get patient home with return precautions given and understood  /b ----------------------------------------- 11:41 AM on 10/14/2018 -----------------------------------------  Patient still in no acute distress abdomen  still benign, urinalysis and blood work and CT scan are reassuring, patient requesting discharge we will discharge with close outpatient follow-up as noted above and return precautions given and understood.  Patient again offered and advised admission and again declined.  ----------------------------------------- 1:03 PM on 10/14/2018 -----------------------------------------  His blood pressure noted to be elevated, he is asymptomatic with this however, given that it is persistently high we did give him some Lopressor and he took his home blood pressure medications, nonetheless he still remains asymptomatically elevated I am not comfortable sending the patient home with such high blood pressure and a aorta aneurysm that seems to be enlarging at a rate we do not quite know so I will admit him to the hospital. I will give him hydralazine, patient understands and agrees with admission at this time     ----------------------------------------- 3:01 PM on 10/14/2018 -----------------------------------------  Patient was admitted several hours ago, however, he began to have wheezing, I was called to bedside, this time there is diffuse wheezing, he states this is not unusual for him.   lungs have been clear this entire time up to now.  hospitalist called me in for assistance.  I gave him an albuterol treatment Solu-Medrol and I repeat the chest x-ray, hospital service is following up on this he is technically under their care and has been for the last several hours.  Is not having chest pain.  He also states he is very anxious about being here.  He is already getting antibiotics from the hospitalist service.  I did look back at his prior CT scan this morning, as the patient states  he was vomiting and felt that he may have aspirated something, there is some question is whether he did aspirate this morning.  Looking back on the CT scan and the main body of the CT scan read there is some question about an  infiltrate which is not represented below in the summation and impressions on the CT scan read.  Significance of these findings are not clear but they could be contributing to his shortness of breath.  Hospitalist, who is technically taking care of this patient and have been for the last several hours, going to advance his care in terms of antibiotics to cover for possible aspiration.  signed out to dr. Joni Fears at the end of my shift.       Joseph Amor, MD 10/14/18 1108    Joseph Amor, MD 10/14/18 1142    Joseph Amor, MD 10/14/18 1304    Joseph Amor, MD 10/14/18 1304    Joseph Amor, MD 10/14/18 1502    Joseph Amor, MD 10/14/18 2833    Joseph Amor, MD 10/14/18 1524    Joseph Amor, MD 10/14/18 [phone]

## 2018-10-14 NOTE — ED Notes (Signed)
Patient transported to X-ray 

## 2018-10-14 NOTE — Discharge Instructions (Addendum)
We do notice on your CT scan that your aneurysm is bigger.  We have offered you admission to the hospital you would prefer to go home.  This is certainly your choice.  However it does limit our ability to monitor you.  Return to the emergency room therefore by ambulance if you feel any abdominal pain, numbness or weakness in your legs, vomiting or fever or other concerns.  Otherwise, follow closely with the vascular surgeon tomorrow.  Do not do any heavy lifting over 5 pounds of weight.

## 2018-10-14 NOTE — ED Provider Notes (Signed)
[facility] Emergency Department Provider Note  ____________________________________________   First MD Initiated Contact with Patient 10/14/18 [phone]     (approximate)  I have reviewed the triage vital signs and the nursing notes.   HISTORY  Chief Complaint Abdominal Pain    HPI Joseph Peters is a 63 y.o. male  With PMHx as below including AAA, COPD, HTN, here with severe abd pain. Pt was in usual state of health until last night. He states he ate soup for dinner then felt fine, was going to bed around midnight when he had multiple episodes of coughing and vomiting. He had no abd pain at the time however. He awoke this morning with ongoing nausea but no additional vomiting, along with severe burning, aching, epigastric and R sided abd pain. The pain then became primarily RLQ. He's had subjective chills but no fevers. No diarrhea. Pain is worse w/ palpation and eating. No alleviating factors.     Past Medical History:  Diagnosis Date  . AAA (abdominal aortic aneurysm) without rupture ([place])   . Abdominal aneurysm ([place]) 2015   being followed but not large enough to surgically treat  . Anxiety   . Arthritis   . Asthma   . Benign hypertension 07/16/2014  . Chest pain on exertion 07/16/2014  . Chronic pain syndrome   . Coccydynia 02/08/2012  . COPD (chronic obstructive pulmonary disease) ([place])    patient smokes  . Coronary artery disease   . 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 Depression   . 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 IBS (irritable bowel syndrome)   . Myocardial infarction ([place]) 2010  . Restless leg syndrome   . Sciatica   . Spinal stenosis of lumbar region   . Stroke [facility]) 2015   no residual symptoms    Patient Active Problem List   Diagnosis Date Noted  . Status post total hip replacement, right 07/01/2018  . Hyperlipidemia 01/30/2018  . Renal artery stenosis ([place]) 12/05/2015  . 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 Syncope and collapse 12/05/2015  . Essential hypertension 10/15/2015  . COPD exacerbation ([place]) 01/31/2015  . Acute respiratory failure ([place]) 01/31/2015  . AAA (abdominal aortic aneurysm) ([place]) 11/23/2014  . Prostate cancer screening 11/02/2014  . Gross hematuria 11/02/2014  . Chest pain on exertion 07/16/2014  . Benign hypertension 07/16/2014  . 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 Coccydynia 02/08/2012  . 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 Gait disorder 02/08/2012    Past Surgical History:  Procedure Laterality Date  . CARDIAC CATHETERIZATION  2012  . COLONOSCOPY WITH PROPOFOL N/A 11/14/2016   Procedure: COLONOSCOPY WITH PROPOFOL;  Surgeon: Manya Silvas, MD;  Location: [facility] ENDOSCOPY;  Service: Endoscopy;  Laterality: N/A;  . ESOPHAGOGASTRODUODENOSCOPY (EGD) WITH PROPOFOL N/A 11/14/2016   Procedure: ESOPHAGOGASTRODUODENOSCOPY (EGD) WITH PROPOFOL;  Surgeon: Manya Silvas, MD;  Location: [facility] ENDOSCOPY;  Service: Endoscopy;  Laterality: N/A;  . KNEE SURGERY     right  . SHOULDER ARTHROSCOPY WITH OPEN ROTATOR CUFF REPAIR Right 08/20/2017   Procedure: SHOULDER ARTHROSCOPY WITH OPEN ROTATOR CUFF REPAIR;  Surgeon: Corky Mull, MD;  Location: [facility] ORS;  Service: Orthopedics;  Laterality: Right;  . SHOULDER ARTHROSCOPY WITH SUBACROMIAL DECOMPRESSION, ROTATOR CUFF REPAIR AND BICEP TENDON REPAIR Left 05/10/2016   Procedure: SHOULDER ARTHROSCOPY WITH DEBTRIDEMENT, DECOMPRESSION, REPAIR OF MASSIVE ROTATOR CUFF TEAR AND BICEP TENODESIS;  Surgeon: Corky Mull, MD;  Location:  [facility] ORS;  Service: Orthopedics;  Laterality: Left;  Massive cuff tear  . TOTAL HIP ARTHROPLASTY Right 07/01/2018   Procedure: TOTAL HIP ARTHROPLASTY - RIGHT;  Surgeon: Corky Mull, MD;  Location: [facility] ORS;  Service: Orthopedics;  Laterality: Right;    Prior to Admission medications   Medication Sig Start Date End Date Taking? Authorizing Provider  albuterol  (PROVENTIL HFA;VENTOLIN HFA) 108 (90 Base) MCG/ACT inhaler Inhale 2 puffs into the lungs every 4 (four) hours as needed for wheezing or shortness of breath.     [provider]  aspirin EC 81 MG tablet Take 81 mg by mouth at bedtime.     [provider]  DALIRESP 250 MCG TABS Take 250 mg by mouth at bedtime.  06/10/18   [provider]  diazepam (VALIUM) 5 MG tablet Take 5 mg by mouth at bedtime as needed (restless legs).  11/15/16   [provider]  finasteride (PROSCAR) 5 MG tablet Take 1 tablet (5 mg total) by mouth daily. 03/06/18   Festus Aloe, MD  FLUoxetine (PROZAC) 20 MG capsule Take 20 mg by mouth daily.     [provider]  Fluticasone-Umeclidin-Vilant (TRELEGY ELLIPTA) 100-62.5-25 MCG/INH AEPB Inhale 2 puffs into the lungs at bedtime.  08/18/18   [provider]  gabapentin (NEURONTIN) 300 MG capsule Take 900 mg by mouth at bedtime.     [provider]  losartan (COZAAR) 25 MG tablet Take 25 mg by mouth daily.     [provider]  montelukast (SINGULAIR) 10 MG tablet Take 10 mg by mouth at bedtime.    [provider]  omeprazole (PRILOSEC) 20 MG capsule Take 20 mg by mouth daily.    [provider]  simvastatin (ZOCOR) 40 MG tablet Take 40 mg by mouth at bedtime.     [provider]  tamsulosin (FLOMAX) 0.4 MG CAPS capsule Take 1 capsule (0.4 mg total) by mouth daily after supper. 06/24/18   Festus Aloe, MD    Allergies Penicillins and Lidocaine  Family History  Problem Relation Age of Onset  . Cancer Mother   . COPD Father 22       black lung    Social History Social History   Tobacco Use  . Smoking status: Current Some Day Smoker    Packs/day: 0.50    Years: 30.00    Pack years: 15.00    Types: Cigarettes  . Smokeless tobacco: Former Systems developer    Quit date: 02/26/1998  Substance Use Topics  . Alcohol use: Yes    Alcohol/week: 0.0 standard drinks    Comment: beer daily   . Drug use: No    Review of Systems  Review of Systems  Constitutional: Positive for fatigue. Negative for chills and fever.  HENT: Negative for sore throat.   Respiratory: Positive for cough. Negative for shortness of breath.   Cardiovascular: Negative for chest pain.  Gastrointestinal: Positive for abdominal pain, nausea and vomiting.  Genitourinary: Negative for flank pain.  Musculoskeletal: Negative for neck pain.  Skin: Negative for rash and wound.  Allergic/Immunologic: Negative for immunocompromised state.  Neurological: Positive for weakness. Negative for numbness.  Hematological: Does not bruise/bleed easily.  All other systems reviewed and are negative.    ____________________________________________  PHYSICAL EXAM:      VITAL SIGNS: ED Triage Vitals  Enc Vitals Group     BP 10/14/18 0634 (!) 175/103     Pulse Rate 10/14/18 0634 90     Resp  10/14/18 0634 19     Temp 10/14/18 0634 98.4 F (36.9 C)     Temp Source 10/14/18 0634 Oral     SpO2 10/14/18 0634 97 %     Weight 10/14/18 0631 129 lb (58.5 kg)     Height 10/14/18 0631 5\' 10"  (1.778 m)     Head Circumference --      Peak Flow --      Pain Score 10/14/18 0631 10     Pain Loc --      Pain Edu? --      Excl. in [place]? --      Physical Exam Vitals signs and nursing note reviewed.  Constitutional:      General: He is not in acute distress.    Appearance: He is well-developed.  HENT:     Head: Normocephalic and atraumatic.  Eyes:     Conjunctiva/sclera: Conjunctivae normal.  Neck:     Musculoskeletal: Neck supple.  Cardiovascular:     Rate and Rhythm: Normal rate and regular rhythm.     Heart sounds: Normal heart sounds. No murmur. No friction rub.  Pulmonary:     Effort: Pulmonary effort is normal. No respiratory distress.     Breath sounds: Wheezing present. No rales.  Abdominal:     General: There is no distension.     Palpations: Abdomen is soft.     Tenderness: There is abdominal tenderness  in the right upper quadrant, right lower quadrant and periumbilical area. There is no guarding or rebound.  Skin:    General: Skin is warm.     Capillary Refill: Capillary refill takes less than 2 seconds.  Neurological:     Mental Status: He is alert and oriented to person, place, and time.     Motor: No abnormal muscle tone.       ____________________________________________   LABS (all labs ordered are listed, but only abnormal results are displayed)  Labs Reviewed  COMPREHENSIVE METABOLIC PANEL - Abnormal; Notable for the following components:      Result Value   Sodium 134 (*)    Glucose, Bld 103 (*)    BUN 7 (*)    Albumin 3.3 (*)    All other components within normal limits  CBC - Abnormal; Notable for the following components:   WBC 11.9 (*)    All other components within normal limits  LIPASE, BLOOD  URINALYSIS, COMPLETE (UACMP) WITH MICROSCOPIC    ____________________________________________  EKG: None ________________________________________  RADIOLOGY All imaging, including plain films, CT scans, and ultrasounds, independently reviewed by me, and interpretations confirmed via formal radiology reads.  ED MD interpretation:   CXR: pending CT: Pending  Official radiology report(s): No results found.  ____________________________________________  PROCEDURES   Procedure(s) performed (including Critical Care):  Procedures  ____________________________________________  INITIAL IMPRESSION / MDM / [place] / ED COURSE  As part of my medical decision making, I reviewed the following data within the electronic MEDICAL RECORD NUMBER Notes from prior ED visits and [place] Controlled Substance Database      *Joseph Peters was evaluated in Emergency Department on 10/14/2018 for the symptoms described in the history of present illness. He was evaluated in the context of the global COVID-19 pandemic, which necessitated consideration that the patient might  be at risk for infection with the SARS-CoV-2 virus that causes COVID-19. Institutional protocols and algorithms that pertain to the evaluation of patients at risk for COVID-19 are in a state of rapid change  based on information released by regulatory bodies including the CDC and federal and state organizations. These policies and algorithms were followed during the patient's care in the ED.  Some ED evaluations and interventions may be delayed as a result of limited staffing during the pandemic.*      Medical Decision Making: 63 yo M here with abd pain. DDx includes appendicitis, cholecystitis, gastritis/PUD, viral GI illness. Pt also with frequent coughing so will check for aspiration PNA, though unlikely primary etiology based on exam. CBC with mild leukocytosis. CMP unremarkable. Lipase normal. CT scan pending.  ____________________________________________  FINAL CLINICAL IMPRESSION(S) / ED DIAGNOSES  Final diagnoses:  Right lower quadrant abdominal pain     MEDICATIONS GIVEN DURING THIS VISIT:  Medications - No data to display   ED Discharge Orders    None       Note:  This document was prepared using Dragon voice recognition software and may include unintentional dictation errors.   Duffy Bruce, MD 10/14/18 [phone]

## 2018-10-14 NOTE — ED Notes (Signed)
ED TO INPATIENT HANDOFF REPORT  ED Nurse Name and Phone #: Seth Bake #[phone]  S Name/Age/Gender Joseph Peters 64 y.o. male Room/Bed: ED37A/ED37A  Code Status   Code Status: Full Code  Home/SNF/Other Home Patient oriented to: self, place, time and situation Is this baseline? Yes   Triage Complete: Triage complete  Chief Complaint abdominal Pain  Triage Note Patient coming in [facility] from home for LRQ abdominal pain and emesis. Patient reports 3 emeses beginning at midnight. Patient is nontender to palpation.    Allergies Allergies  Allergen Reactions  . Penicillins Swelling, Rash and Other (See Comments)    Did it involve swelling of the face/tongue/throat, SOB, or low BP? yes Did it involve sudden or severe rash/hives, skin peeling, or any reaction on the inside of your mouth or nose? no Did you need to seek medical attention at a hospital or doctor's office? Yes When did it last happen?years  If all above answers are "NO", may proceed with cephalosporin use.    . Lidocaine Other (See Comments)    unsure    Level of Care/Admitting Diagnosis ED Disposition    ED Disposition Condition [facility] Area: Early [100120]  Level of Care: Med-Surg [16]  Covid Evaluation: Asymptomatic Screening Protocol (No Symptoms)  Diagnosis: RLQ abdominal pain [595638]  Admitting Physician: Lang Snow [[id number]]  Attending Physician: Rufina Falco ACHIENG [[id number]]  PT Class (Do Not Modify): Observation [104]  PT Acc Code (Do Not Modify): Observation [10022]       B Medical/Surgery History Past Medical History:  Diagnosis Date  . AAA (abdominal aortic aneurysm) without rupture ([place])   . Abdominal aneurysm ([place]) 2015   being followed but not large enough to surgically treat  . Anxiety   . Arthritis   . Asthma   . Benign hypertension 07/16/2014  . Chest pain on exertion 07/16/2014  . Chronic pain syndrome   . Coccydynia  02/08/2012  . COPD (chronic obstructive pulmonary disease) ([place])    patient smokes  . Coronary artery disease   . 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 Depression   . 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 IBS (irritable bowel syndrome)   . Myocardial infarction ([place]) 2010  . Restless leg syndrome   . Sciatica   . Spinal stenosis of lumbar region   . Stroke [facility]) 2015   no residual symptoms   Past Surgical History:  Procedure Laterality Date  . CARDIAC CATHETERIZATION  2012  . COLONOSCOPY WITH PROPOFOL N/A 11/14/2016   Procedure: COLONOSCOPY WITH PROPOFOL;  Surgeon: Manya Silvas, MD;  Location: [facility] ENDOSCOPY;  Service: Endoscopy;  Laterality: N/A;  . ESOPHAGOGASTRODUODENOSCOPY (EGD) WITH PROPOFOL N/A 11/14/2016   Procedure: ESOPHAGOGASTRODUODENOSCOPY (EGD) WITH PROPOFOL;  Surgeon: Manya Silvas, MD;  Location: [facility] ENDOSCOPY;  Service: Endoscopy;  Laterality: N/A;  . KNEE SURGERY     right  . SHOULDER ARTHROSCOPY WITH OPEN ROTATOR CUFF REPAIR Right 08/20/2017   Procedure: SHOULDER ARTHROSCOPY WITH OPEN ROTATOR CUFF REPAIR;  Surgeon: Corky Mull, MD;  Location: [facility] ORS;  Service: Orthopedics;  Laterality: Right;  . SHOULDER ARTHROSCOPY WITH SUBACROMIAL DECOMPRESSION, ROTATOR CUFF REPAIR AND BICEP TENDON REPAIR Left 05/10/2016   Procedure: SHOULDER ARTHROSCOPY WITH DEBTRIDEMENT, DECOMPRESSION, REPAIR OF MASSIVE ROTATOR CUFF TEAR AND BICEP TENODESIS;  Surgeon: Corky Mull, MD;  Location: [facility] ORS;  Service: Orthopedics;  Laterality: Left;  Massive cuff tear  . TOTAL HIP  ARTHROPLASTY Right 07/01/2018   Procedure: TOTAL HIP ARTHROPLASTY - RIGHT;  Surgeon: Corky Mull, MD;  Location: [facility] ORS;  Service: Orthopedics;  Laterality: Right;     A IV Location/Drains/Wounds Patient Lines/Drains/Airways Status   Active Line/Drains/Airways    Name:   Placement date:   Placement time:   Site:   Days:   Peripheral IV 10/14/18 Left Antecubital    10/14/18    2725    Antecubital   less than 1          Intake/Output Last 24 hours  Intake/Output Summary (Last 24 hours) at 10/14/2018 1951 Last data filed at 10/14/2018 3664 Gross per 24 hour  Intake 1000 ml  Output -  Net 1000 ml    Labs/Imaging Results for orders placed or performed during the hospital encounter of 10/14/18 (from the past 48 hour(s))  Lipase, blood     Status: None   Collection Time: 10/14/18  6:40 AM  Result Value Ref Range   Lipase 23 11 - 51 U/L    Comment: Performed at [facility], [address] [place], [place] [address]  Comprehensive metabolic panel     Status: Abnormal   Collection Time: 10/14/18  6:40 AM  Result Value Ref Range   Sodium 134 (L) 135 - 145 mmol/L   Potassium 4.4 3.5 - 5.1 mmol/L   Chloride 101 98 - 111 mmol/L   CO2 24 22 - 32 mmol/L   Glucose, Bld 103 (H) 70 - 99 mg/dL   BUN 7 (L) 8 - 23 mg/dL   Creatinine, Ser 1.03 0.61 - 1.24 mg/dL   Calcium 9.1 8.9 - 10.3 mg/dL   Total Protein 7.3 6.5 - 8.1 g/dL   Albumin 3.3 (L) 3.5 - 5.0 g/dL   AST 18 15 - 41 U/L   ALT 10 0 - 44 U/L   Alkaline Phosphatase 94 38 - 126 U/L   Total Bilirubin 1.0 0.3 - 1.2 mg/dL   GFR calc non Af Amer >60 >60 mL/min   GFR calc Af Amer >60 >60 mL/min   Anion gap 9 5 - 15    Comment: Performed at [facility], [address] [place], [place] [address]  CBC     Status: Abnormal   Collection Time: 10/14/18  6:40 AM  Result Value Ref Range   WBC 11.9 (H) 4.0 - 10.5 K/uL   RBC 4.86 4.22 - 5.81 MIL/uL   Hemoglobin 14.9 13.0 - 17.0 g/dL   HCT 44.4 39.0 - 52.0 %   MCV 91.4 80.0 - 100.0 fL   MCH 30.7 26.0 - 34.0 pg   MCHC 33.6 30.0 - 36.0 g/dL   RDW 12.5 11.5 - 15.5 %   Platelets 303 150 - 400 K/uL   nRBC 0.0 0.0 - 0.2 %    Comment: Performed at [facility], [address] [place], [place] [address]  Urinalysis, Complete w Microscopic     Status: Abnormal   Collection Time: 10/14/18 11:12 AM  Result Value Ref Range    Color, Urine STRAW (A) YELLOW   APPearance CLEAR (A) CLEAR   Specific Gravity, Urine 1.036 (H) 1.005 - 1.030   pH 6.0 5.0 - 8.0   Glucose, UA NEGATIVE NEGATIVE mg/dL   Hgb urine dipstick NEGATIVE NEGATIVE   Bilirubin Urine NEGATIVE NEGATIVE   Ketones, ur 5 (A) NEGATIVE mg/dL   Protein, ur NEGATIVE NEGATIVE mg/dL   Nitrite NEGATIVE NEGATIVE   Leukocytes,Ua NEGATIVE NEGATIVE   RBC /  HPF 0-5 0 - 5 RBC/hpf   WBC, UA 0-5 0 - 5 WBC/hpf   Bacteria, UA NONE SEEN NONE SEEN   Squamous Epithelial / LPF 0-5 0 - 5    Comment: Performed at [facility], [address] [place], [place] [address]  SARS CORONAVIRUS 2 Nasal Swab Aptima Multi Swab     Status: None   Collection Time: 10/14/18 11:12 AM   Specimen: Aptima Multi Swab; Nasal Swab  Result Value Ref Range   SARS Coronavirus 2 NEGATIVE NEGATIVE    Comment: (NOTE) SARS-CoV-2 target nucleic acids are NOT DETECTED. The SARS-CoV-2 RNA is generally detectable in upper and lower respiratory specimens during the acute phase of infection. Negative results do not preclude SARS-CoV-2 infection, do not rule out co-infections with other pathogens, and should not be used as the sole basis for treatment or other patient management decisions. Negative results must be combined with clinical observations, patient history, and epidemiological information. The expected result is Negative. Fact Sheet for Patients: SugarRoll.be Fact Sheet for Healthcare Providers: https://www.woods-mathews.com/ This test is not yet approved or cleared by the [place] FDA and  has been authorized for detection and/or diagnosis of SARS-CoV-2 by FDA under an Emergency Use Authorization (EUA). This EUA will remain  in effect (meaning this test can be used) for the duration of the COVID-19 declaration under Section 56 4(b)(1) of the Act, 21 U.S.C. section 360bbb-3(b)(1), unless the authorization is terminated or revoked  sooner. Performed at [facility], [place] [address], [place], [place] [address]   SARS Coronavirus 2 [facility] order, Performed in [facility] hospital lab) Nasopharyngeal Nasopharyngeal Swab     Status: None   Collection Time: 10/14/18  3:20 PM   Specimen: Nasopharyngeal Swab  Result Value Ref Range   SARS Coronavirus 2 NEGATIVE NEGATIVE    Comment: (NOTE) If result is NEGATIVE SARS-CoV-2 target nucleic acids are NOT DETECTED. The SARS-CoV-2 RNA is generally detectable in upper and lower  respiratory specimens during the acute phase of infection. The lowest  concentration of SARS-CoV-2 viral copies this assay can detect is 250  copies / mL. A negative result does not preclude SARS-CoV-2 infection  and should not be used as the sole basis for treatment or other  patient management decisions.  A negative result may occur with  improper specimen collection / handling, submission of specimen other  than nasopharyngeal swab, presence of viral mutation(s) within the  areas targeted by this assay, and inadequate number of viral copies  (<250 copies / mL). A negative result must be combined with clinical  observations, patient history, and epidemiological information. If result is POSITIVE SARS-CoV-2 target nucleic acids are DETECTED. The SARS-CoV-2 RNA is generally detectable in upper and lower  respiratory specimens dur ing the acute phase of infection.  Positive  results are indicative of active infection with SARS-CoV-2.  Clinical  correlation with patient history and other diagnostic information is  necessary to determine patient infection status.  Positive results do  not rule out bacterial infection or co-infection with other viruses. If result is PRESUMPTIVE POSTIVE SARS-CoV-2 nucleic acids MAY BE PRESENT.   A presumptive positive result was obtained on the submitted specimen  and confirmed on repeat testing.  While 2019 novel coronavirus  (SARS-CoV-2) nucleic acids may be  present in the submitted sample  additional confirmatory testing may be necessary for epidemiological  and / or clinical management purposes  to differentiate between  SARS-CoV-2 and other Sarbecovirus currently known to  infect humans.  If clinically indicated additional testing with an alternate test  methodology [phone]) is advised. The SARS-CoV-2 RNA is generally  detectable in upper and lower respiratory sp ecimens during the acute  phase of infection. The expected result is Negative. Fact Sheet for Patients:  StrictlyIdeas.no Fact Sheet for Healthcare Providers: BankingDealers.co.za This test is not yet approved or cleared by the [place] FDA and has been authorized for detection and/or diagnosis of SARS-CoV-2 by FDA under an Emergency Use Authorization (EUA).  This EUA will remain in effect (meaning this test can be used) for the duration of the COVID-19 declaration under Section 564(b)(1) of the Act, 21 U.S.C. section 360bbb-3(b)(1), unless the authorization is terminated or revoked sooner. Performed at [facility], [address], [place], [place] [address]    Dg Chest 2 View  Result Date: 10/14/2018 CLINICAL DATA:  Chest and abdominal pain. EXAM: CHEST - 2 VIEW COMPARISON:  07/01/2018 FINDINGS: Airspace disease in the right upper lobe in the area of prior nodule seen on CT. Severe underlying COPD/[place]. Heart is normal size. Left lung clear. No effusions or acute bony abnormality. IMPRESSION: Severe COPD. Peripheral airspace disease in the right upper lobe in the area of prior pulmonary nodule. This could reflect pneumonia. Followup PA and lateral chest X-ray is recommended in 3-4 weeks following trial of antibiotic therapy to ensure resolution and exclude underlying malignancy. Electronically Signed   By: Rolm Baptise M.D.   On: 10/14/2018 08:08   Dg Chest Port 1 View  Result Date: 10/14/2018 CLINICAL DATA:   Right lower quadrant pain and emesis. EXAM: PORTABLE CHEST 1 VIEW COMPARISON:  October 14, 2018 FINDINGS: Cardiomediastinal silhouette is normal. Mediastinal contours appear intact. Advanced upper lobe predominant emphysema. Persistent large area of subpleural airspace consolidation in the right upper lobe, in the site of previously seen pulmonary nodule. Osseous structures are without acute abnormality. Soft tissues are grossly normal. IMPRESSION: 1. Persistent large area of subpleural airspace consolidation in the right upper lobe, at the site of previously seen pulmonary nodule. 2. Advanced upper lobe predominant emphysema. Electronically Signed   By: Fidela Salisbury M.D.   On: 10/14/2018 15:46   Ct Angio Chest/abd/pel For Dissection W And/or Wo Contrast  Result Date: 10/14/2018 CLINICAL DATA:  History of AAA, COPD, HTN, here with severe abd pain. Pt was in usual state of health until last night. He states he ate soup for dinner then felt fine, was going to bed around midnight when he had multiple episodes of coughing and vomiting. He had no abd pain at the time however. He awoke this morning with ongoing nausea but no additional vomiting, along with severe burning, aching, epigastric and R sided abd pain. The pain then became primarily RLQ. He's had subjective chills but no fevers. No diarrhea. Pain is worse w/ palpation and eating EXAM: CT ANGIOGRAPHY CHEST, ABDOMEN AND PELVIS TECHNIQUE: Multidetector CT imaging through the chest, abdomen and pelvis was performed using the standard protocol during bolus administration of intravenous contrast. Multiplanar reconstructed images and MIPs were obtained and reviewed to evaluate the vascular anatomy. CONTRAST:  20mL OMNIPAQUE IOHEXOL 350 MG/ML SOLN COMPARISON:  08/13/2018 and previous FINDINGS: CTA CHEST FINDINGS Cardiovascular: Heart size normal. No pericardial effusion. RV is nondilated. Fair contrast opacification of pulmonary artery branches; the exam was  not optimized for detection of pulmonary emboli. Coronary calcifications. Aortic valve leaflet calcifications. Good contrast opacification of the thoracic aorta. No dissection or stenosis. Maximum transverse dimensions as follows: 3.8 cm  sinuses of Valsalva 3.1 cm sino-tubular junction 4.4 cm mid ascending (previously 4.2) 3.4 cm distal ascending/proximal arch 2.8 cm distal arch 2.7 cm proximal descending 3.7 cm distal descending saccular aneurysm vs penetrating atheromatous ulcer (previously 3.4) 2.8 cm supra celiac Mediastinum/Nodes: No mediastinal hematoma. 1.2 cm right hilar lymph node image 51/6, previously 0.9 cm. No mediastinal adenopathy. Lungs/Pleura: No pleural effusion. No pneumothorax. Advanced pulmonary emphysema with new patchy peripheral airspace consolidation in the anterior right upper lobe. This obscures the right upper lobe nodules described previously. 0.6 cm probable intrapulmonary lymph node image 92/7, stable. Left lung clear. Musculoskeletal: Stable mild midthoracic compression deformity. No fracture or worrisome bone lesion. Review of the MIP images confirms the above findings. CTA ABDOMEN AND PELVIS FINDINGS VASCULAR Aorta: Moderate calcified atheromatous plaque. No dissection or stenosis. Fusiform infrarenal aneurysm 4.8 x 4.4 cm maximum transverse dimensions, previously 3.7 cm on 12/01/2015 by my measurement. Aneurysm terminates at the bifurcation. Extensive nonocclusive mural thrombus in the aneurysmal segment. No evidence of rupture. Celiac: Short-segment origin stenosis of possible hemodynamic significance, patent distally. 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 No stenosis. Distal branching otherwise unremarkable. Renals: Single left, with ostial plaque resulting in short segment stenosis of at least mild severity. Duplicated right renal arteries. The superior right renal artery is dominant, and occluded from its origin over length of at least 2 cm,  reconstituted distally. The inferior right renal artery is aberrant and diminutive, arising from the aneurysmal segment of the aorta at the same level of the IMA, remains patent. IMA: Arises from the aneurysmal segment of the aorta. Patent without evidence of aneurysm, dissection, vasculitis or significant stenosis. Inflow: On the right, Short-segment origin stenosis of the common iliac artery of possible hemodynamic significance. Calcified eccentric plaque extends distally through its length without tandem significant stenosis or aneurysm. Internal iliac is atheromatous, patent. Right external iliac and common femoral arteries patent. On the left, calcified atheromatous plaque through the length of the common and internal iliac arteries without high-grade stenosis or aneurysm. External iliac and common femoral patent. Veins: No obvious venous abnormality within the limitations of this arterial phase study. Review of the MIP images confirms the above findings. NON-VASCULAR Hepatobiliary: No focal liver abnormality is seen. No gallstones, gallbladder wall thickening, or biliary dilatation. Pancreas: Unremarkable. No pancreatic ductal dilatation or surrounding inflammatory changes. Spleen: Normal in size without focal abnormality. Adrenals/Urinary Tract: Normal adrenals. Progressive right renal parenchymal atrophy with poor enhancement of the upper pole likely related to superior right renal artery proximal occlusion. No hydronephrosis. No solid renal mass. Urinary bladder physiologically distended. Stomach/Bowel: Stomach is nondistended. Small bowel decompressed. Normal appendix. The colon is nondilated, unremarkable. Lymphatic: No abdominal or pelvic adenopathy. Reproductive: Prostate is unremarkable. Other: No ascites. No free air. Musculoskeletal: Asymmetric thickening of the crus of the diaphragm on the right stable since 12/01/2015. Lower lumbar spondylitic change. No fracture or worrisome bone lesion. Right hip  arthroplasty without dislocation, hardware incompletely visualized. Review of the MIP images confirms the above findings. IMPRESSION: 1. No acute PE, thoracic aortic dissection, or aneurysm rupture. 2. Slight enlargement of 4.4 cm ascending thoracic aortic aneurysm. Recommend annual imaging followup by CTA or MRA. This recommendation follows 2010 ACCF/AHA/AATS/ACR/ASA/SCA/SCAI/SIR/STS/SVM Guidelines for the Diagnosis and Management of Patients with Thoracic Aortic Disease. Circulation. 2010; 121: B284-X324. Aortic aneurysm NOS (ICD10-I71.9) 3. 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 4. Enlarging 4.8 cm fusiform infrarenal abdominal aortic aneurysm (previously  3.7 cm in 2017). Recommend followup by abdomen and pelvis CTA in 6 months, and vascular surgery referral/consultation if not already obtained. This recommendation follows ACR consensus guidelines: White Paper of the ACR Incidental Findings Committee II on Vascular Findings. J Am Coll Radiol 2013; 10:789-794. Aortic aneurysm NOS (ICD10-I71.9) 5. Short-segment origin stenosis of the right common iliac artery of possible hemodynamic significance. Correlate with clinical symptomatology and ABIs. 6. 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 Aortic aneurysm NOS (ICD10-I71.9). Aortic Atherosclerosis (ICD10-I70.0) and Emphysema (ICD10-J43.9). Electronically Signed   By: Lucrezia Europe M.D.   On: 10/14/2018 09:44    Pending Labs Unresulted Labs (From admission, onward)    Start     Ordered   10/15/18 0500  Procalcitonin  Daily,   STAT     10/14/18 1745   10/14/18 1746  Procalcitonin - Baseline  ONCE - STAT,   STAT     10/14/18 1745   10/14/18 1536  Sputum culture  (Non-severe pneumonia (non-ICU care) in adult without resistant organism risk factors.)  Once,   STAT     10/14/18 1539   10/14/18 1527  Culture, sputum-assessment   Once,   STAT     10/14/18 1539   10/14/18 1527  Culture, blood (routine x 2) Call MD if unable to obtain prior to antibiotics being given  BLOOD CULTURE X 2,   STAT    Comments: If blood cultures drawn in Emergency Department - Do not draw and cancel order    10/14/18 1539   10/14/18 1527  Legionella Pneumophila Serogp 1 Ur Ag  Once,   STAT     10/14/18 1539   10/14/18 1527  Strep pneumoniae urinary antigen  Once,   STAT     10/14/18 1539   10/14/18 1418  HIV antibody  Once,   STAT     10/14/18 1422          Vitals/Pain Today's Vitals   10/14/18 1730 10/14/18 1800 10/14/18 1822 10/14/18 1823  BP: (!) 143/91 121/77 130/82   Pulse: (!) 105 96 93   Resp: (!) 21 (!) 21 18   Temp:      TempSrc:      SpO2: 92% 96% 92% 94%  Weight:      Height:      PainSc:   3      Isolation Precautions Droplet and Contact precautions  Medications Medications  morphine 4 MG/ML injection 4 mg (4 mg Intravenous Refused 10/14/18 0755)  oxyCODONE (Oxy IR/ROXICODONE) immediate release tablet 10 mg (10 mg Oral Given 10/14/18 1627)  losartan (COZAAR) tablet 25 mg (has no administration in time range)  simvastatin (ZOCOR) tablet 40 mg (has no administration in time range)  diazepam (VALIUM) tablet 5 mg (has no administration in time range)  FLUoxetine (PROZAC) capsule 20 mg (has no administration in time range)  pantoprazole (PROTONIX) EC tablet 40 mg (has no administration in time range)  finasteride (PROSCAR) tablet 5 mg (has no administration in time range)  tamsulosin (FLOMAX) capsule 0.4 mg (has no administration in time range)  gabapentin (NEURONTIN) capsule 900 mg (has no administration in time range)  tiZANidine (ZANAFLEX) tablet 4 mg (has no administration in time range)  montelukast (SINGULAIR) tablet 10 mg (has no administration in time range)  ipratropium-albuterol (DUONEB) 0.5-2.5 (3) MG/3ML nebulizer solution 3 mL ( Nebulization Not Given 10/14/18 1812)  enoxaparin (LOVENOX) injection 40  mg (has no administration in time range)  nicotine (NICODERM  CQ - dosed in mg/24 hours) patch 21 mg (has no administration in time range)  predniSONE (DELTASONE) tablet 40 mg (has no administration in time range)  levofloxacin (LEVAQUIN) IVPB 750 mg (has no administration in time range)  sodium chloride 0.9 % bolus 1,000 mL (0 mLs Intravenous Stopped 10/14/18 0854)  ondansetron (ZOFRAN) injection 4 mg (4 mg Intravenous Given 10/14/18 1507)  iohexol (OMNIPAQUE) 350 MG/ML injection 75 mL (75 mLs Intravenous Contrast Given 10/14/18 0855)  metoprolol tartrate (LOPRESSOR) injection 5 mg (5 mg Intravenous Given 10/14/18 1201)  hydrALAZINE (APRESOLINE) injection 10 mg (10 mg Intravenous Given 10/14/18 1310)  albuterol (PROVENTIL) (2.5 MG/3ML) 0.083% nebulizer solution 5 mg (5 mg Nebulization Given 10/14/18 1504)  methylPREDNISolone sodium succinate (SOLU-MEDROL) 125 mg/2 mL injection 125 mg (125 mg Intravenous Given 10/14/18 1507)    Mobility walks with person assist Low fall risk   Focused Assessments Cardiac Assessment Handoff:    Lab Results  Component Value Date   TROPONINI <0.03 06/15/2016   No results found for: DDIMER Does the Patient currently have chest pain? No     R Recommendations: See Admitting Provider Note  Report given to:   Additional Notes: n/a

## 2018-10-14 NOTE — ED Triage Notes (Signed)
Patient coming in [facility] from home for LRQ abdominal pain and emesis. Patient reports 3 emeses beginning at midnight. Patient is nontender to palpation.

## 2018-10-14 NOTE — ED Notes (Signed)
Patient transported to CT 

## 2018-10-14 NOTE — ED Notes (Signed)
Pt c/o worsening chronic low back pain. States he is uncomfortable on the ED stretcher. Recliner brought into room. Pt states he is more comfortable being able to sit up. Legs propped up. Pt given meal tray and ginger ale. PRN pain medication to be given. Secretary to call for hospital bed for patient while waiting for IP bed assignment.

## 2018-10-14 NOTE — Consult Note (Signed)
Pulmonary Medicine          Date: 10/14/2018,   MRN# [id number] Joseph Peters 07-07-1955     AdmissionWeight: 58.5 kg                 CurrentWeight: 58.5 kg      CHIEF COMPLAINT:   Moderate acute COPD exacerbation and recent findings of right lung spiculated nodules with hilar/mediastinal lymphadenopathy suggestive of primary bronchogenic carcinoma   HISTORY OF PRESENT ILLNESS   63 year old male with past medical history of advanced combined pulmonary fibrosis and centrilobular bullous emphysema with hyperinflated lung volumes and grade 2D COPD with DLCO 45%, recent treatment for mild COPD exacerbation outpatient basis as well as CT chest and PET scan with concerning hypermetabolic right lung lesions and mediastinal lymphadenopathy suggestive of primary bronchogenic carcinoma.  He was seen by primary pulmonologist 09/29/2018 and was planned for possible EBUS assisted lymph node biopsies however decision was made for additional input from tumor board.  We have discussed these findings with patient and he had significant anxiety and wanted additional evaluation for possible lung cancer to initiate treatment as necessary.  Patient does have thoracic and abdominal aortic aneurysm with enlargement as well as multiple comorbid conditions including chronic pain syndrome, CAD, IBS, MI, restless leg syndrome, CVA, AAA, anxiety, depression, tobacco abuse, alcoholism, and hypertension.    He states that he came in due to worsening abdominal pain the right lower quadrant however reports increased phlegm with dark color orange to brown despite full compliance with his inhaler regimen.  He also describes episodes of vomiting due to nonstop phlegm production overnight.  Despite all this he continues to actively smoke however has cut down to 4 cigarettes/day.  And a lengthy discourse with him regarding smoking cessation today and he has made a promise that he will stop immediately.  On  arrival to the ED, he was afebrile with blood pressure 175/103 mm Hg and pulse rate 90 beats/min. There were no focal neurological deficits; he was alert and oriented x4. Initial labs revealed lipase 23, sodium 134, otherwise unremarkable CMP, slightly elevated WBC of 11.9 otherwise unremarkable.  Urinalysis negative for UTI.  CT angio chest abdomen and pelvis showed no acute PE, slightly enlarging 4.8 cm abdominal aortic aneurysm noted with progressive right renal artery occlusion.  Chest x-ray shows right upper lobe consolidation also noted on CTA.  Given enlarging aneurysm and persistent elevated blood pressure with signs of COPD exacerbations patient will be admitted for further management with pulmonary consultation placed by primary internal medicine provider for evaluation of acute COPD exacerbation and possible underlying malignancy.  Have briefly discussed his case with oncologist in place consultation to further guide [place] on next steps due to high risk for anesthesia to obtain tissue specimen.     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   Depression    Disc disease with myelopathy, 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  IBS (irritable bowel syndrome)    Myocardial infarction [facility]) 2010   Restless leg syndrome    Sciatica    Spinal stenosis of lumbar region    Stroke [facility]) 2015   no residual symptoms     SURGICAL HISTORY   Past Surgical History:  Procedure Laterality Date   CARDIAC CATHETERIZATION   2012   COLONOSCOPY WITH PROPOFOL N/A 11/14/2016   Procedure: COLONOSCOPY WITH PROPOFOL;  Surgeon: Manya Silvas, MD;  Location: [facility] ENDOSCOPY;  Service: Endoscopy;  Laterality: N/A;   ESOPHAGOGASTRODUODENOSCOPY (EGD) WITH PROPOFOL N/A 11/14/2016   Procedure: ESOPHAGOGASTRODUODENOSCOPY (EGD) WITH PROPOFOL;  Surgeon: Manya Silvas, MD;  Location: [facility] ENDOSCOPY;  Service: Endoscopy;  Laterality: N/A;   KNEE SURGERY     right   SHOULDER ARTHROSCOPY WITH OPEN ROTATOR CUFF REPAIR Right 08/20/2017   Procedure: SHOULDER ARTHROSCOPY WITH OPEN ROTATOR CUFF REPAIR;  Surgeon: Corky Mull, MD;  Location: [facility] ORS;  Service: Orthopedics;  Laterality: Right;   SHOULDER ARTHROSCOPY WITH SUBACROMIAL DECOMPRESSION, ROTATOR CUFF REPAIR AND BICEP TENDON REPAIR Left 05/10/2016   Procedure: SHOULDER ARTHROSCOPY WITH DEBTRIDEMENT, DECOMPRESSION, REPAIR OF MASSIVE ROTATOR CUFF TEAR AND BICEP TENODESIS;  Surgeon: Corky Mull, MD;  Location: [facility] ORS;  Service: Orthopedics;  Laterality: Left;  Massive cuff tear   TOTAL HIP ARTHROPLASTY Right 07/01/2018   Procedure: TOTAL HIP ARTHROPLASTY - RIGHT;  Surgeon: Corky Mull, MD;  Location: [facility] ORS;  Service: Orthopedics;  Laterality: Right;     FAMILY HISTORY   Family History  Problem Relation Age of Onset   Cancer Mother    COPD Father 24       black lung     SOCIAL HISTORY   Social History   Tobacco Use   Smoking status: Current Some Day Smoker    Packs/day: 0.50    Years: 30.00    Pack years: 15.00    Types: Cigarettes   Smokeless tobacco: Former Systems developer    Quit date: 02/26/1998  Substance Use Topics   Alcohol use: Yes    Alcohol/week: 0.0 standard drinks    Comment: beer daily   Drug use: No     MEDICATIONS    Home Medication:  Current Outpatient Rx   Order #: [id number] Class: Historical Med   Order #: [id number] Class: Historical Med   Order #: [id number] Class: Historical Med   Order #: [id number] Class: Historical Med    Order #: [id number] Class: Normal   Order #: [id number] Class: Historical Med   Order #: [id number] Class: Historical Med   Order #: [id number] Class: Historical Med   Order #: [id number] Class: Historical Med   Order #: [id number] Class: Historical Med   Order #: [id number] Class: Historical Med   Order #: [id number] Class: Historical Med   Order #: [id number] Class: Historical Med   Order #: [id number] Class: Normal   Order #: [id number] Class: Historical Med    Current Medication:  Current Facility-Administered Medications:    diazepam (VALIUM) tablet 5 mg, 5 mg, Oral, QHS PRN, Stark Klein, Bing Neighbors, NP   enoxaparin (LOVENOX) injection 40 mg, 40 mg, Subcutaneous, Q24H, Ouma, Bing Neighbors, NP   finasteride (PROSCAR) tablet 5 mg, 5 mg, Oral, Daily, Ouma, Bing Neighbors, NP   [START ON 10/15/2018] FLUoxetine (PROZAC) capsule 20 mg, 20 mg, Oral, Daily, Ouma, Bing Neighbors, NP   gabapentin (NEURONTIN) capsule 900 mg, 900 mg, Oral, QHS, Ouma, Elizabeth Achieng, NP   ipratropium-albuterol (DUONEB) 0.5-2.5 (3) MG/3ML nebulizer solution 3 mL, 3 mL, Nebulization, Q6H, Ouma, Bing Neighbors, NP, 3 mL at 10/14/18 1510  levofloxacin (LEVAQUIN) IVPB 750 mg, 750 mg, Intravenous, Q24H, Ouma, Bing Neighbors, NP   losartan (COZAAR) tablet 25 mg, 25 mg, Oral, Daily, Ouma, Bing Neighbors, NP   montelukast (SINGULAIR) tablet 10 mg, 10 mg, Oral, QHS, Ouma, Bing Neighbors, NP   morphine 4 MG/ML injection 4 mg, 4 mg, Intravenous, Once, Duffy Bruce, MD   nicotine (NICODERM CQ - dosed in mg/24 hours) patch 21 mg, 21 mg, Transdermal, Daily, Ouma, Bing Neighbors, NP   oxyCODONE (Oxy IR/ROXICODONE) immediate release tablet 10 mg, 10 mg, Oral, QID PRN, Lang Snow, NP, 10 mg at 10/14/18 1627   [START ON 10/15/2018] pantoprazole (PROTONIX) EC tablet 40 mg, 40 mg, Oral, Daily, Ouma, Bing Neighbors, NP   [START ON 10/15/2018] predniSONE (DELTASONE) tablet 40 mg, 40 mg,  Oral, Q breakfast, Ouma, Bing Neighbors, NP   simvastatin (ZOCOR) tablet 40 mg, 40 mg, Oral, QHS, Ouma, Bing Neighbors, NP   tamsulosin (FLOMAX) capsule 0.4 mg, 0.4 mg, Oral, QPC supper, Ouma, Bing Neighbors, NP   tiZANidine (ZANAFLEX) tablet 4 mg, 4 mg, Oral, TID PRN, Lang Snow, NP  Current Outpatient Medications:    albuterol (PROVENTIL HFA;VENTOLIN HFA) 108 (90 Base) MCG/ACT inhaler, Inhale 2 puffs into the lungs every 4 (four) hours as needed for wheezing or shortness of breath. , Disp: , Rfl:    aspirin EC 81 MG tablet, Take 81 mg by mouth at bedtime. , Disp: , Rfl:    DALIRESP 250 MCG TABS, Take 250 mg by mouth at bedtime. , Disp: , Rfl:    diazepam (VALIUM) 5 MG tablet, Take 5 mg by mouth at bedtime as needed (restless legs). , Disp: , Rfl: 1   finasteride (PROSCAR) 5 MG tablet, Take 1 tablet (5 mg total) by mouth daily., Disp: 90 tablet, Rfl: 3   FLUoxetine (PROZAC) 20 MG capsule, Take 20 mg by mouth daily. , Disp: , Rfl:    Fluticasone-Umeclidin-Vilant (TRELEGY ELLIPTA) 100-62.5-25 MCG/INH AEPB, Inhale 2 puffs into the lungs at bedtime. , Disp: , Rfl:    gabapentin (NEURONTIN) 300 MG capsule, Take 900 mg by mouth at bedtime. , Disp: , Rfl:    losartan (COZAAR) 25 MG tablet, Take 25 mg by mouth daily. , Disp: , Rfl:    montelukast (SINGULAIR) 10 MG tablet, Take 10 mg by mouth at bedtime., Disp: , Rfl:    omeprazole (PRILOSEC) 20 MG capsule, Take 20 mg by mouth daily., Disp: , Rfl:    Oxycodone HCl 10 MG TABS, Take 10 mg by mouth 4 (four) times daily as needed for pain., Disp: , Rfl:    simvastatin (ZOCOR) 40 MG tablet, Take 40 mg by mouth at bedtime. , Disp: , Rfl:    tamsulosin (FLOMAX) 0.4 MG CAPS capsule, Take 1 capsule (0.4 mg total) by mouth daily after supper., Disp: 90 capsule, Rfl: 1   tiZANidine (ZANAFLEX) 4 MG tablet, Take 4 mg by mouth 3 (three) times daily as needed for muscle spasms., Disp: , Rfl:     ALLERGIES   Penicillins and  Lidocaine     REVIEW OF SYSTEMS    Review of Systems:  Gen:  Denies  fever, sweats, chills weigh loss  HEENT: Denies blurred vision, double vision, ear pain, eye pain, hearing loss, nose bleeds, sore throat Cardiac:  No dizziness, chest pain or heaviness, chest tightness,edema Resp:   Denies cough or sputum porduction, shortness of breath,wheezing, hemoptysis,  Gi: Denies swallowing difficulty, stomach pain, nausea or vomiting, diarrhea, constipation, bowel incontinence Gu:  Denies bladder incontinence, burning urine Ext:   Denies Joint pain, stiffness or swelling Skin: Denies  skin rash, easy bruising or bleeding or hives Endoc:  Denies polyuria, polydipsia , polyphagia or weight change Psych:   Denies depression, insomnia or hallucinations   Other:  All other systems negative   VS: BP 130/82 (BP Location: Right Arm)    Pulse 93    Temp 98.4 F (36.9 C) (Oral)    Resp 18    Ht 5\' 10"  (1.778 m)    Wt 58.5 kg    SpO2 94%    BMI 18.51 kg/m      PHYSICAL EXAM    GENERAL:NAD, no fevers, chills, no weakness no fatigue HEAD: Normocephalic, atraumatic.  EYES: Pupils equal, round, reactive to light. Extraocular muscles intact. No scleral icterus.  MOUTH: Moist mucosal membrane. Dentition intact. No abscess noted.  EAR, NOSE, THROAT: Clear without exudates. No external lesions.  NECK: Supple. No thyromegaly. No nodules. No JVD.  PULMONARY: Decreased breath sounds with mild rhonchorous sounds appreciated at the right lung base CARDIOVASCULAR: S1 and S2. Regular rate and rhythm. No murmurs, rubs, or gallops. No edema. Pedal pulses 2+ bilaterally.  GASTROINTESTINAL: Soft, nontender, nondistended. No masses. Positive bowel sounds. No hepatosplenomegaly.  MUSCULOSKELETAL: No swelling, clubbing, or edema. Range of motion full in all extremities.  NEUROLOGIC: Cranial nerves II through XII are intact. No gross focal neurological deficits. Sensation intact. Reflexes intact.  SKIN: No  ulceration, lesions, rashes, or cyanosis. Skin warm and dry. Turgor intact.  PSYCHIATRIC: Mood, affect within normal limits. The patient is awake, alert and oriented x 3. Insight, judgment intact.       IMAGING    Dg Chest 2 View  Result Date: 10/14/2018 CLINICAL DATA:  Chest and abdominal pain. EXAM: CHEST - 2 VIEW COMPARISON:  07/01/2018 FINDINGS: Airspace disease in the right upper lobe in the area of prior nodule seen on CT. Severe underlying COPD/[place]. Heart is normal size. Left lung clear. No effusions or acute bony abnormality. IMPRESSION: Severe COPD. Peripheral airspace disease in the right upper lobe in the area of prior pulmonary nodule. This could reflect pneumonia. Followup PA and lateral chest X-ray is recommended in 3-4 weeks following trial of antibiotic therapy to ensure resolution and exclude underlying malignancy. Electronically Signed   By: Rolm Baptise M.D.   On: 10/14/2018 08:08   Dg Chest Port 1 View  Result Date: 10/14/2018 CLINICAL DATA:  Right lower quadrant pain and emesis. EXAM: PORTABLE CHEST 1 VIEW COMPARISON:  October 14, 2018 FINDINGS: Cardiomediastinal silhouette is normal. Mediastinal contours appear intact. Advanced upper lobe predominant emphysema. Persistent large area of subpleural airspace consolidation in the right upper lobe, in the site of previously seen pulmonary nodule. Osseous structures are without acute abnormality. Soft tissues are grossly normal. IMPRESSION: 1. Persistent large area of subpleural airspace consolidation in the right upper lobe, at the site of previously seen pulmonary nodule. 2. Advanced upper lobe predominant emphysema. Electronically Signed   By: Fidela Salisbury M.D.   On: 10/14/2018 15:46   Ct Angio Chest/abd/pel For Dissection W And/or Wo Contrast  Result Date: 10/14/2018 CLINICAL DATA:  History of AAA, COPD, HTN, here with severe abd pain. Pt was in usual state of health until last night. He states he ate soup for dinner  then felt fine, was going to bed around midnight when he had multiple episodes of coughing and vomiting. He had no abd pain at the time however. He awoke this morning with  ongoing nausea but no additional vomiting, along with severe burning, aching, epigastric and R sided abd pain. The pain then became primarily RLQ. He's had subjective chills but no fevers. No diarrhea. Pain is worse w/ palpation and eating EXAM: CT ANGIOGRAPHY CHEST, ABDOMEN AND PELVIS TECHNIQUE: Multidetector CT imaging through the chest, abdomen and pelvis was performed using the standard protocol during bolus administration of intravenous contrast. Multiplanar reconstructed images and MIPs were obtained and reviewed to evaluate the vascular anatomy. CONTRAST:  12mL OMNIPAQUE IOHEXOL 350 MG/ML SOLN COMPARISON:  08/13/2018 and previous FINDINGS: CTA CHEST FINDINGS Cardiovascular: Heart size normal. No pericardial effusion. RV is nondilated. Fair contrast opacification of pulmonary artery branches; the exam was not optimized for detection of pulmonary emboli. Coronary calcifications. Aortic valve leaflet calcifications. Good contrast opacification of the thoracic aorta. No dissection or stenosis. Maximum transverse dimensions as follows: 3.8 cm sinuses of Valsalva 3.1 cm sino-tubular junction 4.4 cm mid ascending (previously 4.2) 3.4 cm distal ascending/proximal arch 2.8 cm distal arch 2.7 cm proximal descending 3.7 cm distal descending saccular aneurysm vs penetrating atheromatous ulcer (previously 3.4) 2.8 cm supra celiac Mediastinum/Nodes: No mediastinal hematoma. 1.2 cm right hilar lymph node image 51/6, previously 0.9 cm. No mediastinal adenopathy. Lungs/Pleura: No pleural effusion. No pneumothorax. Advanced pulmonary emphysema with new patchy peripheral airspace consolidation in the anterior right upper lobe. This obscures the right upper lobe nodules described previously. 0.6 cm probable intrapulmonary lymph node image 92/7, stable. Left  lung clear. Musculoskeletal: Stable mild midthoracic compression deformity. No fracture or worrisome bone lesion. Review of the MIP images confirms the above findings. CTA ABDOMEN AND PELVIS FINDINGS VASCULAR Aorta: Moderate calcified atheromatous plaque. No dissection or stenosis. Fusiform infrarenal aneurysm 4.8 x 4.4 cm maximum transverse dimensions, previously 3.7 cm on 12/01/2015 by my measurement. Aneurysm terminates at the bifurcation. Extensive nonocclusive mural thrombus in the aneurysmal segment. No evidence of rupture. Celiac: Short-segment origin stenosis of possible hemodynamic significance, patent distally. 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 No stenosis. Distal branching otherwise unremarkable. Renals: Single left, with ostial plaque resulting in short segment stenosis of at least mild severity. Duplicated right renal arteries. The superior right renal artery is dominant, and occluded from its origin over length of at least 2 cm, reconstituted distally. The inferior right renal artery is aberrant and diminutive, arising from the aneurysmal segment of the aorta at the same level of the IMA, remains patent. IMA: Arises from the aneurysmal segment of the aorta. Patent without evidence of aneurysm, dissection, vasculitis or significant stenosis. Inflow: On the right, Short-segment origin stenosis of the common iliac artery of possible hemodynamic significance. Calcified eccentric plaque extends distally through its length without tandem significant stenosis or aneurysm. Internal iliac is atheromatous, patent. Right external iliac and common femoral arteries patent. On the left, calcified atheromatous plaque through the length of the common and internal iliac arteries without high-grade stenosis or aneurysm. External iliac and common femoral patent. Veins: No obvious venous abnormality within the limitations of this arterial phase study. Review of the MIP images confirms  the above findings. NON-VASCULAR Hepatobiliary: No focal liver abnormality is seen. No gallstones, gallbladder wall thickening, or biliary dilatation. Pancreas: Unremarkable. No pancreatic ductal dilatation or surrounding inflammatory changes. Spleen: Normal in size without focal abnormality. Adrenals/Urinary Tract: Normal adrenals. Progressive right renal parenchymal atrophy with poor enhancement of the upper pole likely related to superior right renal artery proximal occlusion. No hydronephrosis. No solid renal mass. Urinary bladder physiologically distended. Stomach/Bowel: Stomach  is nondistended. Small bowel decompressed. Normal appendix. The colon is nondilated, unremarkable. Lymphatic: No abdominal or pelvic adenopathy. Reproductive: Prostate is unremarkable. Other: No ascites. No free air. Musculoskeletal: Asymmetric thickening of the crus of the diaphragm on the right stable since 12/01/2015. Lower lumbar spondylitic change. No fracture or worrisome bone lesion. Right hip arthroplasty without dislocation, hardware incompletely visualized. Review of the MIP images confirms the above findings. IMPRESSION: 1. No acute PE, thoracic aortic dissection, or aneurysm rupture. 2. Slight enlargement of 4.4 cm ascending thoracic aortic aneurysm. Recommend annual imaging followup by CTA or MRA. This recommendation follows 2010 ACCF/AHA/AATS/ACR/ASA/SCA/SCAI/SIR/STS/SVM Guidelines for the Diagnosis and Management of Patients with Thoracic Aortic Disease. Circulation. 2010; 121: W413-K440. Aortic aneurysm NOS (ICD10-I71.9) 3. 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 4. Enlarging 4.8 cm fusiform infrarenal abdominal aortic aneurysm (previously 3.7 cm in 2017). Recommend followup by abdomen and pelvis CTA in 6 months, and vascular surgery referral/consultation if not already obtained. This recommendation follows ACR consensus guidelines: White Paper  of the ACR Incidental Findings Committee II on Vascular Findings. J Am Coll Radiol 2013; 10:789-794. Aortic aneurysm NOS (ICD10-I71.9) 5. Short-segment origin stenosis of the right common iliac artery of possible hemodynamic significance. Correlate with clinical symptomatology and ABIs. 6. 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 Aortic aneurysm NOS (ICD10-I71.9). Aortic Atherosclerosis (ICD10-I70.0) and Emphysema (ICD10-J43.9). Electronically Signed   By: Lucrezia Europe M.D.   On: 10/14/2018 09:44       PET CT 09/01/18 IMPRESSION: 1. Hypermetabolic (max SUV 10.2) irregular solid 1.8 cm peripheral right upper lobe pulmonary nodule, compatible with primary bronchogenic carcinoma, increased in size from recent chest CT. 2. Hypermetabolic more central right upper lobe 0.8 cm solid pulmonary nodule, also increased in size, suspicious for pulmonary metastasis to the same lobe. 3. Hypermetabolic right hilar lymph node suspicious for metastatic disease. No hypermetabolic mediastinal adenopathy. 4. No hypermetabolic distant metastatic disease. 5. Infrarenal 4.5 cm Abdominal Aortic Aneurysm (ICD10-I71.9). Recommend follow-up CTA of the abdomen and pelvis in 6 months and vascular surgery referral/consultation. This recommendation follows ACR consensus guidelines: White Paper of the ACR Incidental Findings Committee II on Vascular Findings. J Am Coll Radiol 2013; 10:789-794. 6. Aortic Atherosclerosis (ICD10-I70.0) and Emphysema (ICD10-J43.9).  pe  ASSESSMENT/PLAN   Moderate acute exacerbation of COPD -COPD care path -Levaquin 750 p.o. daily -Prednisone 40 mg p.o. daily-  fast taper due to history of avascular necrosis -Incentive spirometer at bedside -MetaNeb therapy with saline every 6 hours -DuoNebs every 6 hours while awake -Evaluate need for home O2 therapy when optimized for DC    Spiculated right lung nodule and ipsilateral  satellite lesion with PET avid hilar lymphadenopathy -High pretest probability of lung malignancy -Have discussed briefly with oncologist Dr. Rogue Bussing will place consult for nonurgent evaluation to guide further diagnostic approach and therapeutic options - will be available to assist with tissue diagnosis as deemed appropriate     Tobacco abuse  -Nicotine replacement therapy with transdermal patch -Smoking cessation counseling provided for over 10 minutes today -Patient states he will quit completely immediately       Thank you for allowing me to participate in the care of this patient.   Patient/Family are satisfied with care plan and all questions have been answered.  This document was prepared using Dragon voice recognition software and may include unintentional dictation errors.     Ottie Glazier, M.D.  Division of Pulmonary & Critical  [place]

## 2018-10-14 NOTE — Consult Note (Signed)
Pharmacy Antibiotic Note  Joseph Peters is a 63 y.o. male admitted on 10/14/2018 with CAP.  Pharmacy has been consulted for Levofloxacin dosing. Patient has PMH significant for COPD and a PET scan which showed right upper lung and right hilar lesions concerning for malignancy.   Levofloxacin IV 750 mg Q24H already ordered.   Allergy: PCN - patient reported that he had to be "revived" when took this medication. Of note, patient had ceftriaxone 1 g on 02/01/2015. When asked about his reaction to CTX, he reports that he doesn't recall.     Plan: Will continue Levofloxacin IV 750 mg Q24H for now.    Height: 5\' 10"  (177.8 cm) Weight: 129 lb (58.5 kg) IBW/kg (Calculated) : 73  Temp (24hrs), Avg:98.4 F (36.9 C), Min:98.4 F (36.9 C), Max:98.4 F (36.9 C)  Recent Labs  Lab 10/14/18 0640  WBC 11.9*  CREATININE 1.03    Estimated Creatinine Clearance: 61.5 mL/min (by C-G formula based on SCr of 1.03 mg/dL).    Allergies  Allergen Reactions  . Penicillins Swelling, Rash and Other (See Comments)    Did it involve swelling of the face/tongue/throat, SOB, or low BP? yes Did it involve sudden or severe rash/hives, skin peeling, or any reaction on the inside of your mouth or nose? no Did you need to seek medical attention at a hospital or doctor's office? Yes When did it last happen?years  If all above answers are "NO", may proceed with cephalosporin use.    . Lidocaine Other (See Comments)    unsure    Antimicrobials this admission: 8/18 Levofloxacin >>>   Dose adjustments this admission: N/A  Microbiology results: 8/18 BCx: pending  8/18 Sputum: pending    Thank you for allowing pharmacy to be a part of this patient's care.  Rowland Lathe 10/14/2018 4:41 PM

## 2018-10-14 NOTE — ED Notes (Signed)
Resumed care of pt. Pt resting comfortably, states he is feeling better and his sob has decreased significantly. Pt alert & oriented with NAD noted.

## 2018-10-14 NOTE — H&P (Signed)
[facility] [place] NAME: Joseph Peters    MR#:  [id number]  DATE OF BIRTH:  09/03/1955  DATE OF ADMISSION:  10/14/2018  PRIMARY CARE PHYSICIAN: Zeb Comfort, MD   REQUESTING/REFERRING PHYSICIAN: Charlotte Crumb, MD  CHIEF COMPLAINT:   Chief Complaint  Patient presents with   Abdominal Pain    HISTORY OF PRESENT ILLNESS:   63 year old male with past medical history of advanced COPD, chronic pain syndrome, CAD, IBS, MI, restless leg syndrome, CVA, AAA, anxiety, depression, tobacco abuse, alcoholism, and hypertension presenting with right lower quadrant pain.  Patient stated symptoms of right lower quadrant pain started this morning at 4 am, prior to the he had an episode of nausea and vomiting at about 12 am.  Denies fevers or chills, diarrhea, or chest pain. Patient however endorses chronic productive cough with shortness of breath.  He is a daily smoker and smokes about a half a pack of cigarettes x 40 years.  Patient was recently seen by pulmonologist Dr. Raul Del on 09/29/2018 for severe COPD, and phenotype.  Had a PET scan which showed right upper lung and right hilar lesions concerning for malignancy.  Patient states he is trying to quit smoking given possible malignancy and history of AAA.  On arrival to the ED, he was afebrile with blood pressure 175/103 mm Hg and pulse rate 90 beats/min. There were no focal neurological deficits; he was alert and oriented x4. Initial labs revealed lipase 23, sodium 134, otherwise unremarkable CMP, slightly elevated WBC of 11.9 otherwise unremarkable.  Urinalysis negative for UTI.  CT angio chest abdomen and pelvis showed no acute PE, slightly enlarging 4.8 cm abdominal aortic aneurysm noted with progressive right renal artery occlusion.  Chest x-ray shows right upper lobe consolidation also noted on CTA.  Given enlarging aneurysm and persistent elevated blood pressure with signs of COPD exacerbations  patient will be admitted for further management.  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   IBS (irritable bowel syndrome)    Myocardial infarction ([place]) 2010   Restless leg syndrome    Sciatica    Spinal stenosis of lumbar region    Stroke [facility]) 2015   no residual symptoms    PAST SURGICAL HISTORY:   Past Surgical History:  Procedure Laterality Date   CARDIAC CATHETERIZATION  2012   COLONOSCOPY WITH PROPOFOL N/A 11/14/2016   Procedure: COLONOSCOPY WITH PROPOFOL;  Surgeon: Manya Silvas, MD;  Location: [facility] ENDOSCOPY;  Service: Endoscopy;  Laterality: N/A;   ESOPHAGOGASTRODUODENOSCOPY (EGD) WITH PROPOFOL N/A 11/14/2016   Procedure: ESOPHAGOGASTRODUODENOSCOPY (EGD) WITH PROPOFOL;  Surgeon: Manya Silvas, MD;  Location: [facility] ENDOSCOPY;  Service: Endoscopy;  Laterality: N/A;   KNEE SURGERY     right   SHOULDER ARTHROSCOPY WITH OPEN ROTATOR CUFF REPAIR Right 08/20/2017   Procedure: SHOULDER ARTHROSCOPY WITH OPEN ROTATOR  CUFF REPAIR;  Surgeon: Corky Mull, MD;  Location: [facility] ORS;  Service: Orthopedics;  Laterality: Right;   SHOULDER ARTHROSCOPY WITH SUBACROMIAL DECOMPRESSION, ROTATOR CUFF REPAIR AND BICEP TENDON REPAIR Left 05/10/2016   Procedure: SHOULDER ARTHROSCOPY WITH DEBTRIDEMENT, DECOMPRESSION, REPAIR OF MASSIVE ROTATOR CUFF TEAR AND BICEP TENODESIS;  Surgeon: Corky Mull, MD;  Location: [facility] ORS;   Service: Orthopedics;  Laterality: Left;  Massive cuff tear   TOTAL HIP ARTHROPLASTY Right 07/01/2018   Procedure: TOTAL HIP ARTHROPLASTY - RIGHT;  Surgeon: Corky Mull, MD;  Location: [facility] ORS;  Service: Orthopedics;  Laterality: Right;    SOCIAL HISTORY:   Social History   Tobacco Use   Smoking status: Current Some Day Smoker    Packs/day: 0.50    Years: 30.00    Pack years: 15.00    Types: Cigarettes   Smokeless tobacco: Former Systems developer    Quit date: 02/26/1998  Substance Use Topics   Alcohol use: Yes    Alcohol/week: 0.0 standard drinks    Comment: beer daily    FAMILY HISTORY:   Family History  Problem Relation Age of Onset   Cancer Mother    COPD Father 59       black lung    DRUG ALLERGIES:   Allergies  Allergen Reactions   Penicillins Swelling, Rash and Other (See Comments)    Did it involve swelling of the face/tongue/throat, SOB, or low BP? yes Did it involve sudden or severe rash/hives, skin peeling, or any reaction on the inside of your mouth or nose? no Did you need to seek medical attention at a hospital or doctor's office? Yes When did it last happen?years  If all above answers are "NO", may proceed with cephalosporin use.     Lidocaine Other (See Comments)    unsure    REVIEW OF SYSTEMS:   Review of Systems  Constitutional: Negative for chills, fever, malaise/fatigue and weight loss.  HENT: Negative for congestion, hearing loss and sore throat.   Eyes: Negative for blurred vision and double vision.  Respiratory: Positive for cough, sputum production, shortness of breath and wheezing.   Cardiovascular: Negative for chest pain, palpitations, orthopnea and leg swelling.  Gastrointestinal: Positive for abdominal pain, nausea and vomiting. Negative for diarrhea.  Genitourinary: Negative for dysuria and urgency.  Musculoskeletal: Positive for back pain, joint pain and neck pain. Negative for myalgias.  Skin: Negative for rash.    Neurological: Negative for dizziness, sensory change, speech change, focal weakness and headaches.  Psychiatric/Behavioral: Negative for depression. The patient is nervous/anxious.    MEDICATIONS AT HOME:   Prior to Admission medications   Medication Sig Start Date End Date Taking? Authorizing Provider  albuterol (PROVENTIL HFA;VENTOLIN HFA) 108 (90 Base) MCG/ACT inhaler Inhale 2 puffs into the lungs every 4 (four) hours as needed for wheezing or shortness of breath.    Yes [provider]  aspirin EC 81 MG tablet Take 81 mg by mouth at bedtime.    Yes [provider]  DALIRESP 250 MCG TABS Take 250 mg by mouth at bedtime.  06/10/18  Yes [provider]  diazepam (VALIUM) 5 MG tablet Take 5 mg by mouth at bedtime as needed (restless legs).  11/15/16  Yes [provider]  finasteride (PROSCAR) 5 MG tablet Take 1 tablet (5 mg total) by mouth daily. 03/06/18  Yes Festus Aloe, MD  FLUoxetine (PROZAC) 20 MG capsule Take 20 mg by mouth daily.    Yes [provider]  Fluticasone-Umeclidin-Vilant (TRELEGY ELLIPTA) 100-62.5-25 MCG/INH AEPB Inhale 2 puffs into the lungs at bedtime.  08/18/18  Yes [provider]  gabapentin (NEURONTIN) 300 MG capsule Take 900 mg by mouth at bedtime.    Yes [provider]  losartan (COZAAR) 25 MG tablet Take 25 mg by mouth daily.    Yes [provider]  montelukast (SINGULAIR) 10 MG tablet Take 10 mg by mouth at bedtime.   Yes [provider]  omeprazole (PRILOSEC) 20 MG capsule Take 20 mg by mouth daily.   Yes [provider]  Oxycodone HCl 10 MG TABS Take 10 mg by mouth 4 (four) times daily as needed for pain. 09/29/18  Yes [provider]  simvastatin (ZOCOR) 40 MG tablet Take 40 mg by mouth at bedtime.    Yes [provider]  tamsulosin (FLOMAX) 0.4 MG CAPS capsule Take 1 capsule (0.4 mg total) by mouth daily after supper. 06/24/18  Yes Festus Aloe, MD   tiZANidine (ZANAFLEX) 4 MG tablet Take 4 mg by mouth 3 (three) times daily as needed for muscle spasms. 10/10/18  Yes [provider]      VITAL SIGNS:  Blood pressure (!) 154/99, pulse 80, temperature 98.4 F (36.9 C), temperature source Oral, resp. rate 16, height 5\' 10"  (1.778 m), weight 58.5 kg, SpO2 98 %.  PHYSICAL EXAMINATION:   Physical Exam  GENERAL:  63 y.o.-year-old patient lying in the bed with no acute distress.  EYES: Pupils equal, round, reactive to light and accommodation. No scleral icterus. Extraocular muscles intact.  HEENT: Head atraumatic, normocephalic. Oropharynx and nasopharynx clear.  NECK:  Supple, no jugular venous distention. No thyroid enlargement, no tenderness.  LUNGS: Decreased breath sounds bilaterally, moderate wheezing, rales, and rhonchi bilaterally. No crepitation. No use of accessory muscles of respiration.  CARDIOVASCULAR: S1, S2 normal. No murmurs, rubs, or gallops.  ABDOMEN: Soft, nontender, nondistended. Bowel sounds present. No organomegaly or mass.  EXTREMITIES: No pedal edema, cyanosis, clubbing of fingers.  No rash or lesions. + pedal pulses MUSCULOSKELETAL: Normal bulk, and power was 5+ grip and elbow, knee, and ankle flexion and extension bilaterally.  NEUROLOGIC:Alert and oriented x 3. CN 2-12 intact. Sensation to light touch and cold stimuli intact bilaterally. Finger to nose nl. Babinski is downgoing. DTR's (biceps, patellar, and achilles) 2+ and symmetric throughout. Gait not tested due to safety concern. PSYCHIATRIC: The patient is alert and oriented x 3.  SKIN: No obvious rash, lesion, or ulcer.   DATA REVIEWED:  LABORATORY PANEL:   CBC Recent Labs  Lab 10/14/18 0640  WBC 11.9*  HGB 14.9  HCT 44.4  PLT 303   ------------------------------------------------------------------------------------------------------------------  Chemistries  Recent Labs  Lab 10/14/18 0640  NA 134*  K 4.4  CL 101  CO2 24  GLUCOSE  103*  BUN 7*  CREATININE 1.03  CALCIUM 9.1  AST 18  ALT 10  ALKPHOS 94  BILITOT 1.0   ------------------------------------------------------------------------------------------------------------------  Cardiac Enzymes No results for input(s): TROPONINI in the last 168 hours. ------------------------------------------------------------------------------------------------------------------  RADIOLOGY:  Dg Chest 2 View  Result Date: 10/14/2018 CLINICAL DATA:  Chest and abdominal pain. EXAM: CHEST - 2 VIEW COMPARISON:  07/01/2018 FINDINGS: Airspace disease in the right upper lobe in the area of prior nodule seen on CT. Severe underlying COPD/[place]. Heart is normal size. Left lung clear. No effusions or acute bony abnormality. IMPRESSION: Severe COPD. Peripheral airspace disease in the right upper lobe in the area of prior pulmonary nodule. This could reflect pneumonia. Followup PA  and lateral chest X-ray is recommended in 3-4 weeks following trial of antibiotic therapy to ensure resolution and exclude underlying malignancy. Electronically Signed   By: Rolm Baptise M.D.   On: 10/14/2018 08:08   Ct Angio Chest/abd/pel For Dissection W And/or Wo Contrast  Result Date: 10/14/2018 CLINICAL DATA:  History of AAA, COPD, HTN, here with severe abd pain. Pt was in usual state of health until last night. He states he ate soup for dinner then felt fine, was going to bed around midnight when he had multiple episodes of coughing and vomiting. He had no abd pain at the time however. He awoke this morning with ongoing nausea but no additional vomiting, along with severe burning, aching, epigastric and R sided abd pain. The pain then became primarily RLQ. He's had subjective chills but no fevers. No diarrhea. Pain is worse w/ palpation and eating EXAM: CT ANGIOGRAPHY CHEST, ABDOMEN AND PELVIS TECHNIQUE: Multidetector CT imaging through the chest, abdomen and pelvis was performed using the standard protocol  during bolus administration of intravenous contrast. Multiplanar reconstructed images and MIPs were obtained and reviewed to evaluate the vascular anatomy. CONTRAST:  90mL OMNIPAQUE IOHEXOL 350 MG/ML SOLN COMPARISON:  08/13/2018 and previous FINDINGS: CTA CHEST FINDINGS Cardiovascular: Heart size normal. No pericardial effusion. RV is nondilated. Fair contrast opacification of pulmonary artery branches; the exam was not optimized for detection of pulmonary emboli. Coronary calcifications. Aortic valve leaflet calcifications. Good contrast opacification of the thoracic aorta. No dissection or stenosis. Maximum transverse dimensions as follows: 3.8 cm sinuses of Valsalva 3.1 cm sino-tubular junction 4.4 cm mid ascending (previously 4.2) 3.4 cm distal ascending/proximal arch 2.8 cm distal arch 2.7 cm proximal descending 3.7 cm distal descending saccular aneurysm vs penetrating atheromatous ulcer (previously 3.4) 2.8 cm supra celiac Mediastinum/Nodes: No mediastinal hematoma. 1.2 cm right hilar lymph node image 51/6, previously 0.9 cm. No mediastinal adenopathy. Lungs/Pleura: No pleural effusion. No pneumothorax. Advanced pulmonary emphysema with new patchy peripheral airspace consolidation in the anterior right upper lobe. This obscures the right upper lobe nodules described previously. 0.6 cm probable intrapulmonary lymph node image 92/7, stable. Left lung clear. Musculoskeletal: Stable mild midthoracic compression deformity. No fracture or worrisome bone lesion. Review of the MIP images confirms the above findings. CTA ABDOMEN AND PELVIS FINDINGS VASCULAR Aorta: Moderate calcified atheromatous plaque. No dissection or stenosis. Fusiform infrarenal aneurysm 4.8 x 4.4 cm maximum transverse dimensions, previously 3.7 cm on 12/01/2015 by my measurement. Aneurysm terminates at the bifurcation. Extensive nonocclusive mural thrombus in the aneurysmal segment. No evidence of rupture. Celiac: Short-segment origin stenosis of  possible hemodynamic significance, patent distally. 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 No stenosis. Distal branching otherwise unremarkable. Renals: Single left, with ostial plaque resulting in short segment stenosis of at least mild severity. Duplicated right renal arteries. The superior right renal artery is dominant, and occluded from its origin over length of at least 2 cm, reconstituted distally. The inferior right renal artery is aberrant and diminutive, arising from the aneurysmal segment of the aorta at the same level of the IMA, remains patent. IMA: Arises from the aneurysmal segment of the aorta. Patent without evidence of aneurysm, dissection, vasculitis or significant stenosis. Inflow: On the right, Short-segment origin stenosis of the common iliac artery of possible hemodynamic significance. Calcified eccentric plaque extends distally through its length without tandem significant stenosis or aneurysm. Internal iliac is atheromatous, patent. Right external iliac and common femoral arteries patent. On the left, calcified atheromatous plaque through  the length of the common and internal iliac arteries without high-grade stenosis or aneurysm. External iliac and common femoral patent. Veins: No obvious venous abnormality within the limitations of this arterial phase study. Review of the MIP images confirms the above findings. NON-VASCULAR Hepatobiliary: No focal liver abnormality is seen. No gallstones, gallbladder wall thickening, or biliary dilatation. Pancreas: Unremarkable. No pancreatic ductal dilatation or surrounding inflammatory changes. Spleen: Normal in size without focal abnormality. Adrenals/Urinary Tract: Normal adrenals. Progressive right renal parenchymal atrophy with poor enhancement of the upper pole likely related to superior right renal artery proximal occlusion. No hydronephrosis. No solid renal mass. Urinary bladder physiologically distended.  Stomach/Bowel: Stomach is nondistended. Small bowel decompressed. Normal appendix. The colon is nondilated, unremarkable. Lymphatic: No abdominal or pelvic adenopathy. Reproductive: Prostate is unremarkable. Other: No ascites. No free air. Musculoskeletal: Asymmetric thickening of the crus of the diaphragm on the right stable since 12/01/2015. Lower lumbar spondylitic change. No fracture or worrisome bone lesion. Right hip arthroplasty without dislocation, hardware incompletely visualized. Review of the MIP images confirms the above findings. IMPRESSION: 1. No acute PE, thoracic aortic dissection, or aneurysm rupture. 2. Slight enlargement of 4.4 cm ascending thoracic aortic aneurysm. Recommend annual imaging followup by CTA or MRA. This recommendation follows 2010 ACCF/AHA/AATS/ACR/ASA/SCA/SCAI/SIR/STS/SVM Guidelines for the Diagnosis and Management of Patients with Thoracic Aortic Disease. Circulation. 2010; 121: Z610-R604. Aortic aneurysm NOS (ICD10-I71.9) 3. 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 4. Enlarging 4.8 cm fusiform infrarenal abdominal aortic aneurysm (previously 3.7 cm in 2017). Recommend followup by abdomen and pelvis CTA in 6 months, and vascular surgery referral/consultation if not already obtained. This recommendation follows ACR consensus guidelines: White Paper of the ACR Incidental Findings Committee II on Vascular Findings. J Am Coll Radiol 2013; 10:789-794. Aortic aneurysm NOS (ICD10-I71.9) 5. Short-segment origin stenosis of the right common iliac artery of possible hemodynamic significance. Correlate with clinical symptomatology and ABIs. 6. 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 Aortic aneurysm NOS (ICD10-I71.9). Aortic Atherosclerosis (ICD10-I70.0) and Emphysema (ICD10-J43.9). Electronically Signed   By: Lucrezia Europe M.D.   On: 10/14/2018 09:44     EKG:  EKG: unchanged from previous tracings, sinus tachycardia. Vent. rate 107 BPM PR interval * ms QRS duration 89 ms QT/QTc 336/449 ms P-R-T axes 64 42 70 IMPRESSION AND PLAN:   63 y.o. male  with past medical history of advanced COPD, chronic pain syndrome, CAD, IBS, MI, restless leg syndrome, CVA, AAA, anxiety, depression, tobacco abuse, alcoholism, and hypertension presenting with right lower quadrant pain.  1. Right lower quadrant pain - Unclear etiology. CT abdomen shows no obvious cause, labs reassuring - Admit to medsurg unit - IVFs - PRN pain meds  2. Right upper lobe pneumonia - Chest xray shows large area of subpleural airspace consolidation in the right upper lobe - Patient allergic to penicillin will start empiric with levofloxacin - Blood cultures pending - Check procalcitonin  3. Acute COPD Exacerbation secondary to pneumonia? - Patient with hx of Advanced COPD with recent PET scan concerning for leak Izora Gala - Supplemental O2, goal sat 88-92% - Bronchodilators (albuterol/ipratropium) standing and PRN - Corticosteroids: Prednisone 40mg  PO daily x5 days - Sputum culture pending - Levofloxacin as above - Pulmonary consult to Dr. Lanney Gins  4. Coronary Artery Disease - hx of MI - ASA 81mg  PO daily - HTN, HLD, control as below  5. HLD  + Goal LDL<100 - Simvastatin 40mg  PO qhs  6. HTN  + Goal BP <130/80 -Continue losartan -Hydralazine PRN  7. History of AAA -CT angios chest/abdomen/pelvis shows enlarging aneurysm 4.8 cm previously 3.7 cm in 2017 and short segment stenosis of the right common iliac artery, progressive right renal artery occlusion -Findings discussed with vascular surgeon Dr. Lucky Cowboy. Will schedule appointment on outpatient basis for follow up.  8. Tobacco abuse -Smoking cessation counseling provided -Nicotine patch provided   All the records are reviewed and case discussed with ED provider. Management plans discussed with the patient,  family and they are in agreement.  CODE STATUS: FULL  TOTAL TIME TAKING CARE OF THIS PATIENT: 50 minutes.    on 10/14/2018 at 3:09 PM  Rufina Falco, DNP, FNP-BC [facility] Nurse Practitioner Between 7am to 6pm - Pager [phone]  After 6pm go to [email or web address] - password [email or web address] [facility]  Office  [phone]  CC: Primary care physician; Zeb Comfort, MD

## 2018-10-14 NOTE — ED Notes (Signed)
Verified with pt that he does NOT want to be admitted at this time. Dr. Burlene Arnt at bedside explaining risks of going home instead of admission. Pt still wants to leave and follow up outpatient.

## 2018-10-15 ENCOUNTER — Encounter: Payer: Self-pay | Admitting: Internal Medicine

## 2018-10-15 ENCOUNTER — Inpatient Hospital Stay: Payer: Medicare Other

## 2018-10-15 ENCOUNTER — Other Ambulatory Visit: Payer: Self-pay | Admitting: Internal Medicine

## 2018-10-15 DIAGNOSIS — Z825 Family history of asthma and other chronic lower respiratory diseases: Secondary | ICD-10-CM | POA: Diagnosis not present

## 2018-10-15 DIAGNOSIS — Z681 Body mass index (BMI) 19 or less, adult: Secondary | ICD-10-CM | POA: Diagnosis not present

## 2018-10-15 DIAGNOSIS — R0602 Shortness of breath: Secondary | ICD-10-CM | POA: Diagnosis present

## 2018-10-15 DIAGNOSIS — M48061 Spinal stenosis, lumbar region without neurogenic claudication: Secondary | ICD-10-CM | POA: Diagnosis present

## 2018-10-15 DIAGNOSIS — K589 Irritable bowel syndrome without diarrhea: Secondary | ICD-10-CM | POA: Diagnosis present

## 2018-10-15 DIAGNOSIS — J189 Pneumonia, unspecified organism: Secondary | ICD-10-CM | POA: Diagnosis present

## 2018-10-15 DIAGNOSIS — G44019 Episodic cluster headache, not intractable: Secondary | ICD-10-CM | POA: Diagnosis not present

## 2018-10-15 DIAGNOSIS — F329 Major depressive disorder, single episode, unspecified: Secondary | ICD-10-CM | POA: Diagnosis present

## 2018-10-15 DIAGNOSIS — Z96641 Presence of right artificial hip joint: Secondary | ICD-10-CM | POA: Diagnosis present

## 2018-10-15 DIAGNOSIS — G44011 Episodic cluster headache, intractable: Secondary | ICD-10-CM | POA: Diagnosis not present

## 2018-10-15 DIAGNOSIS — G44009 Cluster headache syndrome, unspecified, not intractable: Secondary | ICD-10-CM | POA: Diagnosis present

## 2018-10-15 DIAGNOSIS — Z20828 Contact with and (suspected) exposure to other viral communicable diseases: Secondary | ICD-10-CM | POA: Diagnosis present

## 2018-10-15 DIAGNOSIS — R918 Other nonspecific abnormal finding of lung field: Secondary | ICD-10-CM

## 2018-10-15 DIAGNOSIS — F17210 Nicotine dependence, cigarettes, uncomplicated: Secondary | ICD-10-CM | POA: Diagnosis present

## 2018-10-15 DIAGNOSIS — C3411 Malignant neoplasm of upper lobe, right bronchus or lung: Secondary | ICD-10-CM | POA: Diagnosis present

## 2018-10-15 DIAGNOSIS — R1031 Right lower quadrant pain: Secondary | ICD-10-CM

## 2018-10-15 DIAGNOSIS — J441 Chronic obstructive pulmonary disease with (acute) exacerbation: Secondary | ICD-10-CM | POA: Diagnosis present

## 2018-10-15 DIAGNOSIS — R911 Solitary pulmonary nodule: Secondary | ICD-10-CM

## 2018-10-15 DIAGNOSIS — I714 Abdominal aortic aneurysm, without rupture: Secondary | ICD-10-CM

## 2018-10-15 DIAGNOSIS — H5711 Ocular pain, right eye: Secondary | ICD-10-CM | POA: Diagnosis present

## 2018-10-15 DIAGNOSIS — J440 Chronic obstructive pulmonary disease with acute lower respiratory infection: Secondary | ICD-10-CM | POA: Diagnosis present

## 2018-10-15 DIAGNOSIS — G894 Chronic pain syndrome: Secondary | ICD-10-CM | POA: Diagnosis present

## 2018-10-15 DIAGNOSIS — G2581 Restless legs syndrome: Secondary | ICD-10-CM | POA: Diagnosis present

## 2018-10-15 DIAGNOSIS — I2510 Atherosclerotic heart disease of native coronary artery without angina pectoris: Secondary | ICD-10-CM | POA: Diagnosis present

## 2018-10-15 DIAGNOSIS — E43 Unspecified severe protein-calorie malnutrition: Secondary | ICD-10-CM | POA: Insufficient documentation

## 2018-10-15 DIAGNOSIS — I10 Essential (primary) hypertension: Secondary | ICD-10-CM | POA: Diagnosis present

## 2018-10-15 DIAGNOSIS — Z8673 Personal history of transient ischemic attack (TIA), and cerebral infarction without residual deficits: Secondary | ICD-10-CM | POA: Diagnosis not present

## 2018-10-15 DIAGNOSIS — I252 Old myocardial infarction: Secondary | ICD-10-CM | POA: Diagnosis not present

## 2018-10-15 DIAGNOSIS — E785 Hyperlipidemia, unspecified: Secondary | ICD-10-CM | POA: Diagnosis present

## 2018-10-15 DIAGNOSIS — F419 Anxiety disorder, unspecified: Secondary | ICD-10-CM | POA: Diagnosis present

## 2018-10-15 LAB — LEGIONELLA PNEUMOPHILA SEROGP 1 UR AG: L. pneumophila Serogp 1 Ur Ag: NEGATIVE

## 2018-10-15 LAB — PROCALCITONIN: Procalcitonin: <0.1 ng/mL

## 2018-10-15 LAB — HIV ANTIBODY (ROUTINE TESTING W REFLEX): HIV Screen 4th Generation wRfx: NONREACTIVE

## 2018-10-15 MED ORDER — HYDRALAZINE HCL 20 MG/ML IJ SOLN
10.0000 mg | Freq: Four times a day (QID) | INTRAMUSCULAR | Status: DC | PRN
  Administered 2018-10-15: 10 mg via INTRAVENOUS
  Filled 2018-10-15: qty 1

## 2018-10-15 MED ORDER — ENSURE ENLIVE PO LIQD
237.0000 mL | Freq: Three times a day (TID) | ORAL | Status: DC
  Administered 2018-10-15 – 2018-10-16 (×4): 237 mL via ORAL

## 2018-10-15 MED ORDER — ADULT MULTIVITAMIN W/MINERALS CH
1.0000 | ORAL_TABLET | Freq: Every day | ORAL | Status: DC
  Administered 2018-10-16 – 2018-10-17 (×2): 1 via ORAL
  Filled 2018-10-15 (×2): qty 1

## 2018-10-15 MED ORDER — VITAMIN C 500 MG PO TABS
250.0000 mg | ORAL_TABLET | Freq: Two times a day (BID) | ORAL | Status: DC
  Administered 2018-10-15 – 2018-10-17 (×4): 250 mg via ORAL
  Filled 2018-10-15 (×4): qty 1

## 2018-10-15 MED ORDER — MORPHINE SULFATE (PF) 2 MG/ML IV SOLN: 2.0000 mg | INTRAVENOUS | Status: AC

## 2018-10-15 NOTE — Consult Note (Signed)
Reason for Consult: R eye pain  Referring Physician: Dr. Margaretmary Eddy   CC: R eye pain   HPI: Joseph Peters is an 63 y.o. male with hx of advanced COPD, chronic pain syndrome, CAD, IBS, MI, restless leg syndrome, CVA, AAA, anxiety, depression, tobacco abuse, alcoholism, and hypertension presenting yesterday  with right lower quadrant pain.  Denies fevers or chills, diarrhea, or chest pain. Patient however endorses chronic productive cough with shortness of breath.  He is a daily smoker and smokes about a half a pack of cigarettes x 40 years. Admitted with COPD exacerbation Neurology consulted due to brief episode of R frontal eye pain and possibly facial droop that self resolved in minutes.     Past Medical History:  Diagnosis Date  . AAA (abdominal aortic aneurysm) without rupture ([place])   . Abdominal aneurysm ([place]) 2015   being followed but not large enough to surgically treat  . Anxiety   . Arthritis   . Asthma   . Benign hypertension 07/16/2014  . Chest pain on exertion 07/16/2014  . Chronic pain syndrome   . Coccydynia 02/08/2012  . COPD (chronic obstructive pulmonary disease) ([place])    patient smokes  . Coronary artery disease   . 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 Depression   . 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 IBS (irritable bowel syndrome)   . Myocardial infarction ([place]) 2010  . Restless leg syndrome   . Sciatica   . Spinal stenosis of lumbar region   . Stroke [facility]) 2015   no residual symptoms    Past Surgical History:  Procedure Laterality Date  . CARDIAC CATHETERIZATION  2012  . COLONOSCOPY WITH PROPOFOL N/A 11/14/2016   Procedure: COLONOSCOPY WITH PROPOFOL;  Surgeon: Manya Silvas, MD;  Location: [facility] ENDOSCOPY;  Service: Endoscopy;  Laterality: N/A;  . ESOPHAGOGASTRODUODENOSCOPY (EGD) WITH PROPOFOL N/A 11/14/2016   Procedure: ESOPHAGOGASTRODUODENOSCOPY (EGD) WITH PROPOFOL;  Surgeon: Manya Silvas, MD;  Location: [facility]  ENDOSCOPY;  Service: Endoscopy;  Laterality: N/A;  . KNEE SURGERY     right  . SHOULDER ARTHROSCOPY WITH OPEN ROTATOR CUFF REPAIR Right 08/20/2017   Procedure: SHOULDER ARTHROSCOPY WITH OPEN ROTATOR CUFF REPAIR;  Surgeon: Corky Mull, MD;  Location: [facility] ORS;  Service: Orthopedics;  Laterality: Right;  . SHOULDER ARTHROSCOPY WITH SUBACROMIAL DECOMPRESSION, ROTATOR CUFF REPAIR AND BICEP TENDON REPAIR Left 05/10/2016   Procedure: SHOULDER ARTHROSCOPY WITH DEBTRIDEMENT, DECOMPRESSION, REPAIR OF MASSIVE ROTATOR CUFF TEAR AND BICEP TENODESIS;  Surgeon: Corky Mull, MD;  Location: [facility] ORS;  Service: Orthopedics;  Laterality: Left;  Massive cuff tear  . TOTAL HIP ARTHROPLASTY Right 07/01/2018   Procedure: TOTAL HIP ARTHROPLASTY - RIGHT;  Surgeon: Corky Mull, MD;  Location: [facility] ORS;  Service: Orthopedics;  Laterality: Right;    Family History  Problem Relation Age of Onset  . Cancer Mother   . COPD Father 76       black lung    Social History:  reports that he has been smoking cigarettes. He has a 15.00 pack-year smoking history. He quit smokeless tobacco use about 20 years ago. He reports current alcohol use. He reports that he does not use drugs.  Allergies  Allergen Reactions  . Penicillins Swelling, Rash and Other (See Comments)    Did it involve swelling of the face/tongue/throat, SOB, or low BP? yes Did it involve sudden or severe rash/hives, skin peeling, or any reaction on the inside of your mouth  or nose? no Did you need to seek medical attention at a hospital or doctor's office? Yes When did it last happen?years  If all above answers are "NO", may proceed with cephalosporin use.    . Lidocaine Other (See Comments)    unsure    Medications: I have reviewed the patient's current medications.  ROS: Pt is very anxious and unable to provide full ROS history  Physical Examination: Blood pressure (!) 144/84, pulse 96, temperature 97.7 F (36.5 C), temperature source  Oral, resp. rate (!) 28, height 5\' 6"  (1.676 m), weight 55.2 kg, SpO2 99 %.  HEENT-  Normocephalic, no lesions, without obvious abnormality.  Normal external eye and conjunctiva.  Normal TM's bilaterally.  Normal auditory canals and external ears. Normal external nose, mucus membranes and septum.  Normal pharynx. Cardiovascular- regular rate and rhythm, S1, S2 normal, no murmur, click, rub or gallop, pulses palpable throughout   Lungs- Heart exam - S1, S2 normal, no murmur, no gallop, rate regular Abdomen- soft, non-tender; bowel sounds normal; no masses,  no organomegaly Extremities- no skin discoloration Lymph-no adenopathy palpable Musculoskeletal-no joint tenderness, deformity or swelling, osteoarthritic changes noted in both hands Skin-dry  Neurological Examination   Mental Status: Alert, oriented, thought content appropriate.  Speech fluent without evidence of aphasia.  Able to follow 3 step commands without difficulty. Cranial Nerves: II: Discs flat bilaterally; Visual fields grossly normal, pupils equal, round, reactive to light and accommodation III,IV, VI: ptosis not present, extra-ocular motions intact bilaterally V,VII: smile symmetric, facial light touch sensation normal bilaterally VIII: hearing normal bilaterally IX,X: gag reflex present XI: bilateral shoulder shrug XII: midline tongue extension Motor: Right : Upper extremity   5/5    Left:     Upper extremity   5/5  Lower extremity   5/5     Lower extremity   5/5 Tone and bulk:normal tone throughout; no atrophy noted Sensory: Pinprick and light touch intact throughout, bilaterally Deep Tendon Reflexes: 1+ and symmetric throughout Plantars: Right: downgoing   Left: downgoing Cerebellar: Not tested  Gait: not tested       Laboratory Studies:   Basic Metabolic Panel: Recent Labs  Lab 10/14/18 0640  NA 134*  K 4.4  CL 101  CO2 24  GLUCOSE 103*  BUN 7*  CREATININE 1.03  CALCIUM 9.1    Liver Function  Tests: Recent Labs  Lab 10/14/18 0640  AST 18  ALT 10  ALKPHOS 94  BILITOT 1.0  PROT 7.3  ALBUMIN 3.3*   Recent Labs  Lab 10/14/18 0640  LIPASE 23   No results for input(s): AMMONIA in the last 168 hours.  CBC: Recent Labs  Lab 10/14/18 0640  WBC 11.9*  HGB 14.9  HCT 44.4  MCV 91.4  PLT 303    Cardiac Enzymes: No results for input(s): CKTOTAL, CKMB, CKMBINDEX, TROPONINI in the last 168 hours.  BNP: Invalid input(s): POCBNP  CBG: No results for input(s): GLUCAP in the last 168 hours.  Microbiology: Results for orders placed or performed during the hospital encounter of 10/14/18  SARS CORONAVIRUS 2 Nasal Swab Aptima Multi Swab     Status: None   Collection Time: 10/14/18 11:12 AM   Specimen: Aptima Multi Swab; Nasal Swab  Result Value Ref Range Status   SARS Coronavirus 2 NEGATIVE NEGATIVE Final    Comment: (NOTE) SARS-CoV-2 target nucleic acids are NOT DETECTED. The SARS-CoV-2 RNA is generally detectable in upper and lower respiratory specimens during the acute phase of infection. Negative results  do not preclude SARS-CoV-2 infection, do not rule out co-infections with other pathogens, and should not be used as the sole basis for treatment or other patient management decisions. Negative results must be combined with clinical observations, patient history, and epidemiological information. The expected result is Negative. Fact Sheet for Patients: SugarRoll.be Fact Sheet for Healthcare Providers: https://www.woods-mathews.com/ This test is not yet approved or cleared by the [place] FDA and  has been authorized for detection and/or diagnosis of SARS-CoV-2 by FDA under an Emergency Use Authorization (EUA). This EUA will remain  in effect (meaning this test can be used) for the duration of the COVID-19 declaration under Section 56 4(b)(1) of the Act, 21 U.S.C. section 360bbb-3(b)(1), unless the authorization is  terminated or revoked sooner. Performed at [facility], [place] [address], [place], [place] [address]   SARS Coronavirus 2 [facility] order, Performed in [facility] hospital lab) Nasopharyngeal Nasopharyngeal Swab     Status: None   Collection Time: 10/14/18  3:20 PM   Specimen: Nasopharyngeal Swab  Result Value Ref Range Status   SARS Coronavirus 2 NEGATIVE NEGATIVE Final    Comment: (NOTE) If result is NEGATIVE SARS-CoV-2 target nucleic acids are NOT DETECTED. The SARS-CoV-2 RNA is generally detectable in upper and lower  respiratory specimens during the acute phase of infection. The lowest  concentration of SARS-CoV-2 viral copies this assay can detect is 250  copies / mL. A negative result does not preclude SARS-CoV-2 infection  and should not be used as the sole basis for treatment or other  patient management decisions.  A negative result may occur with  improper specimen collection / handling, submission of specimen other  than nasopharyngeal swab, presence of viral mutation(s) within the  areas targeted by this assay, and inadequate number of viral copies  (<250 copies / mL). A negative result must be combined with clinical  observations, patient history, and epidemiological information. If result is POSITIVE SARS-CoV-2 target nucleic acids are DETECTED. The SARS-CoV-2 RNA is generally detectable in upper and lower  respiratory specimens dur ing the acute phase of infection.  Positive  results are indicative of active infection with SARS-CoV-2.  Clinical  correlation with patient history and other diagnostic information is  necessary to determine patient infection status.  Positive results do  not rule out bacterial infection or co-infection with other viruses. If result is PRESUMPTIVE POSTIVE SARS-CoV-2 nucleic acids MAY BE PRESENT.   A presumptive positive result was obtained on the submitted specimen  and confirmed on repeat testing.  While 2019 novel coronavirus   (SARS-CoV-2) nucleic acids may be present in the submitted sample  additional confirmatory testing may be necessary for epidemiological  and / or clinical management purposes  to differentiate between  SARS-CoV-2 and other Sarbecovirus currently known to infect humans.  If clinically indicated additional testing with an alternate test  methodology [phone]) is advised. The SARS-CoV-2 RNA is generally  detectable in upper and lower respiratory sp ecimens during the acute  phase of infection. The expected result is Negative. Fact Sheet for Patients:  StrictlyIdeas.no Fact Sheet for Healthcare Providers: BankingDealers.co.za This test is not yet approved or cleared by the [place] FDA and has been authorized for detection and/or diagnosis of SARS-CoV-2 by FDA under an Emergency Use Authorization (EUA).  This EUA will remain in effect (meaning this test can be used) for the duration of the COVID-19 declaration under Section 564(b)(1) of the Act, 21 U.S.C. section 360bbb-3(b)(1), unless the authorization is terminated  or revoked sooner. Performed at [facility], [address] [place], [place] [address]   Culture, blood (routine x 2) Call MD if unable to obtain prior to antibiotics being given     Status: None (Preliminary result)   Collection Time: 10/14/18  3:27 PM   Specimen: BLOOD  Result Value Ref Range Status   Specimen Description BLOOD RIGHT ANTECUBITAL  Final   Special Requests   Final    BOTTLES DRAWN AEROBIC AND ANAEROBIC Blood Culture results may not be optimal due to an excessive volume of blood received in culture bottles   Culture   Final    NO GROWTH < 24 HOURS Performed at [facility], [address] [place], [place] [address]    Report Status PENDING  Incomplete  Culture, blood (routine x 2) Call MD if unable to obtain prior to antibiotics being given     Status: None (Preliminary result)    Collection Time: 10/14/18  3:32 PM   Specimen: BLOOD  Result Value Ref Range Status   Specimen Description BLOOD LEFT ANTECUBITAL  Final   Special Requests   Final    BOTTLES DRAWN AEROBIC AND ANAEROBIC Blood Culture results may not be optimal due to an excessive volume of blood received in culture bottles   Culture   Final    NO GROWTH < 24 HOURS Performed at [facility], [address] [place],  [address]    Report Status PENDING  Incomplete    Coagulation Studies: No results for input(s): LABPROT, INR in the last 72 hours.  Urinalysis:  Recent Labs  Lab 10/14/18 1112  COLORURINE STRAW*  LABSPEC 1.036*  PHURINE 6.0  GLUCOSEU NEGATIVE  HGBUR NEGATIVE  BILIRUBINUR NEGATIVE  KETONESUR 5*  PROTEINUR NEGATIVE  NITRITE NEGATIVE  LEUKOCYTESUR NEGATIVE    Lipid Panel:  No results found for: CHOL, TRIG, HDL, CHOLHDL, VLDL, LDLCALC  HgbA1C: No results found for: HGBA1C  Urine Drug Screen:  No results found for: LABOPIA, COCAINSCRNUR, LABBENZ, AMPHETMU, THCU, LABBARB  Alcohol Level: No results for input(s): ETH in the last 168 hours.  Other results: EKG: normal EKG, normal sinus rhythm, unchanged from previous tracings.  Imaging: Dg Chest 2 View  Result Date: 10/14/2018 CLINICAL DATA:  Chest and abdominal pain. EXAM: CHEST - 2 VIEW COMPARISON:  07/01/2018 FINDINGS: Airspace disease in the right upper lobe in the area of prior nodule seen on CT. Severe underlying COPD/[place]. Heart is normal size. Left lung clear. No effusions or acute bony abnormality. IMPRESSION: Severe COPD. Peripheral airspace disease in the right upper lobe in the area of prior pulmonary nodule. This could reflect pneumonia. Followup PA and lateral chest X-ray is recommended in 3-4 weeks following trial of antibiotic therapy to ensure resolution and exclude underlying malignancy. Electronically Signed   By: Rolm Baptise M.D.   On: 10/14/2018 08:08   Dg Chest Port 1 View  Result Date:  10/14/2018 CLINICAL DATA:  Right lower quadrant pain and emesis. EXAM: PORTABLE CHEST 1 VIEW COMPARISON:  October 14, 2018 FINDINGS: Cardiomediastinal silhouette is normal. Mediastinal contours appear intact. Advanced upper lobe predominant emphysema. Persistent large area of subpleural airspace consolidation in the right upper lobe, in the site of previously seen pulmonary nodule. Osseous structures are without acute abnormality. Soft tissues are grossly normal. IMPRESSION: 1. Persistent large area of subpleural airspace consolidation in the right upper lobe, at the site of previously seen pulmonary nodule. 2. Advanced upper lobe predominant emphysema. Electronically Signed   By:  Fidela Salisbury M.D.   On: 10/14/2018 15:46   Ct Angio Chest/abd/pel For Dissection W And/or Wo Contrast  Result Date: 10/14/2018 CLINICAL DATA:  History of AAA, COPD, HTN, here with severe abd pain. Pt was in usual state of health until last night. He states he ate soup for dinner then felt fine, was going to bed around midnight when he had multiple episodes of coughing and vomiting. He had no abd pain at the time however. He awoke this morning with ongoing nausea but no additional vomiting, along with severe burning, aching, epigastric and R sided abd pain. The pain then became primarily RLQ. He's had subjective chills but no fevers. No diarrhea. Pain is worse w/ palpation and eating EXAM: CT ANGIOGRAPHY CHEST, ABDOMEN AND PELVIS TECHNIQUE: Multidetector CT imaging through the chest, abdomen and pelvis was performed using the standard protocol during bolus administration of intravenous contrast. Multiplanar reconstructed images and MIPs were obtained and reviewed to evaluate the vascular anatomy. CONTRAST:  56mL OMNIPAQUE IOHEXOL 350 MG/ML SOLN COMPARISON:  08/13/2018 and previous FINDINGS: CTA CHEST FINDINGS Cardiovascular: Heart size normal. No pericardial effusion. RV is nondilated. Fair contrast opacification of pulmonary  artery branches; the exam was not optimized for detection of pulmonary emboli. Coronary calcifications. Aortic valve leaflet calcifications. Good contrast opacification of the thoracic aorta. No dissection or stenosis. Maximum transverse dimensions as follows: 3.8 cm sinuses of Valsalva 3.1 cm sino-tubular junction 4.4 cm mid ascending (previously 4.2) 3.4 cm distal ascending/proximal arch 2.8 cm distal arch 2.7 cm proximal descending 3.7 cm distal descending saccular aneurysm vs penetrating atheromatous ulcer (previously 3.4) 2.8 cm supra celiac Mediastinum/Nodes: No mediastinal hematoma. 1.2 cm right hilar lymph node image 51/6, previously 0.9 cm. No mediastinal adenopathy. Lungs/Pleura: No pleural effusion. No pneumothorax. Advanced pulmonary emphysema with new patchy peripheral airspace consolidation in the anterior right upper lobe. This obscures the right upper lobe nodules described previously. 0.6 cm probable intrapulmonary lymph node image 92/7, stable. Left lung clear. Musculoskeletal: Stable mild midthoracic compression deformity. No fracture or worrisome bone lesion. Review of the MIP images confirms the above findings. CTA ABDOMEN AND PELVIS FINDINGS VASCULAR Aorta: Moderate calcified atheromatous plaque. No dissection or stenosis. Fusiform infrarenal aneurysm 4.8 x 4.4 cm maximum transverse dimensions, previously 3.7 cm on 12/01/2015 by my measurement. Aneurysm terminates at the bifurcation. Extensive nonocclusive mural thrombus in the aneurysmal segment. No evidence of rupture. Celiac: Short-segment origin stenosis of possible hemodynamic significance, patent distally. 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 No stenosis. Distal branching otherwise unremarkable. Renals: Single left, with ostial plaque resulting in short segment stenosis of at least mild severity. Duplicated right renal arteries. The superior right renal artery is dominant, and occluded from its origin over  length of at least 2 cm, reconstituted distally. The inferior right renal artery is aberrant and diminutive, arising from the aneurysmal segment of the aorta at the same level of the IMA, remains patent. IMA: Arises from the aneurysmal segment of the aorta. Patent without evidence of aneurysm, dissection, vasculitis or significant stenosis. Inflow: On the right, Short-segment origin stenosis of the common iliac artery of possible hemodynamic significance. Calcified eccentric plaque extends distally through its length without tandem significant stenosis or aneurysm. Internal iliac is atheromatous, patent. Right external iliac and common femoral arteries patent. On the left, calcified atheromatous plaque through the length of the common and internal iliac arteries without high-grade stenosis or aneurysm. External iliac and common femoral patent. Veins: No obvious venous abnormality within  the limitations of this arterial phase study. Review of the MIP images confirms the above findings. NON-VASCULAR Hepatobiliary: No focal liver abnormality is seen. No gallstones, gallbladder wall thickening, or biliary dilatation. Pancreas: Unremarkable. No pancreatic ductal dilatation or surrounding inflammatory changes. Spleen: Normal in size without focal abnormality. Adrenals/Urinary Tract: Normal adrenals. Progressive right renal parenchymal atrophy with poor enhancement of the upper pole likely related to superior right renal artery proximal occlusion. No hydronephrosis. No solid renal mass. Urinary bladder physiologically distended. Stomach/Bowel: Stomach is nondistended. Small bowel decompressed. Normal appendix. The colon is nondilated, unremarkable. Lymphatic: No abdominal or pelvic adenopathy. Reproductive: Prostate is unremarkable. Other: No ascites. No free air. Musculoskeletal: Asymmetric thickening of the crus of the diaphragm on the right stable since 12/01/2015. Lower lumbar spondylitic change. No fracture or  worrisome bone lesion. Right hip arthroplasty without dislocation, hardware incompletely visualized. Review of the MIP images confirms the above findings. IMPRESSION: 1. No acute PE, thoracic aortic dissection, or aneurysm rupture. 2. Slight enlargement of 4.4 cm ascending thoracic aortic aneurysm. Recommend annual imaging followup by CTA or MRA. This recommendation follows 2010 ACCF/AHA/AATS/ACR/ASA/SCA/SCAI/SIR/STS/SVM Guidelines for the Diagnosis and Management of Patients with Thoracic Aortic Disease. Circulation. 2010; 121: V859-Y924. Aortic aneurysm NOS (ICD10-I71.9) 3. 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 4. Enlarging 4.8 cm fusiform infrarenal abdominal aortic aneurysm (previously 3.7 cm in 2017). Recommend followup by abdomen and pelvis CTA in 6 months, and vascular surgery referral/consultation if not already obtained. This recommendation follows ACR consensus guidelines: White Paper of the ACR Incidental Findings Committee II on Vascular Findings. J Am Coll Radiol 2013; 10:789-794. Aortic aneurysm NOS (ICD10-I71.9) 5. Short-segment origin stenosis of the right common iliac artery of possible hemodynamic significance. Correlate with clinical symptomatology and ABIs. 6. 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 Aortic aneurysm NOS (ICD10-I71.9). Aortic Atherosclerosis (ICD10-I70.0) and Emphysema (ICD10-J43.9). Electronically Signed   By: Lucrezia Europe M.D.   On: 10/14/2018 09:44     Assessment/Plan:  64 y.o. male with hx of advanced COPD, chronic pain syndrome, CAD, IBS, MI, restless leg syndrome, CVA, AAA, anxiety, depression, tobacco abuse, alcoholism, and hypertension presenting yesterday  with right lower quadrant pain.  Denies fevers or chills, diarrhea, or chest pain. Patient however endorses chronic productive cough with shortness of breath.  He is a daily  smoker and smokes about a half a pack of cigarettes x 40 years. Admitted with COPD exacerbation Neurology consulted due to brief episode of R frontal eye pain and possibly facial droop that self resolved in minutes.     - CTH now  - not convinced this is TIA or stroke related but more so cluster Headache with brief sharp pain behind eye that lasts seconds to few minutes - cluster headaches are treated with oxygen which he is on right now.  - Would hold off any further imaging such as MRI if no abnormalities on [facility]  10/15/2018, 2:31 PM

## 2018-10-15 NOTE — Evaluation (Signed)
Occupational Therapy Evaluation Patient Details Name: Joseph Peters MRN: [id number] DOB: Apr 23, 1955 Today's Date: 10/15/2018    History of Present Illness [name] is a 63 year old male with past medical history of advanced COPD, chronic pain syndrome, CAD, IBS, MI, restless leg syndrome, CVA, AAA, anxiety, depression, and hypertension presenting to the ED on 10/14/18 with right lower quadrant pain. CT angio chest abdomen and pelvis showed no acute PE, slightly enlarging 4.8 cm abdominal aortic aneurysm noted with progressive right renal artery occlusion.  Chest x-ray showed right upper lobe consolidation also noted on CTA. Pt admitted for mgt of enlarging aneurysm and persistent elevated blood pressure with signs of COPD exacerbations.   Clinical Impression   [name] seen for OT evaluation this date. Pt was independent in all ADLs and mobility, living in a 1 story mobile home with 5 steps to enter at the back and bilateral hand rails.  Pt reports becoming easily fatigued or out of breath with minimal exertion on this date in addition to constant 5/10 right lower quadrant pain. Pt currently requires minimal to moderate assist for ADL mgt due to poor activity tolerance and increased SOB. 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 Per pt wife, Joseph Peters, pt lives at home with his 4 dogs as his only pets. Also per wife, pt has never lived or worked on a farm. RN notified. Pt otherwise A&O. Pt educated in limited energy conservation conservation strategies including pursed lip breathing, activity pacing, home/routines modifications, work simplification, AE/DME, prioritizing of meaningful occupations, and falls prevention. Mobility attempts limited at RN suggestion due to pt cardiopulmonary status. Pt verbalized understanding of education provided but would benefit from additional  skilled OT services to maximize recall and carryover of learned techniques and facilitate implementation of learned techniques into daily routines.   At end of session, pt began c/o of 10/10 sharp/shooting pain behind his R eye. Pt requesting to see his RN. OT notified one RN on the floor who located this pt's primary RN. Both RN's in room to assess pt at the end of this evaluation. At this time, recommend [place] services upon hospital DC but will continue to monitor pt neurological and cognitive status to ensure this remains the safest most appropriate DC option for this pt.       Follow Up Recommendations  Home health OT    Equipment Recommendations  Other (comment)(TBD)    Recommendations for Other Services       Precautions / Restrictions        Mobility Bed Mobility Overal bed mobility: Needs Assistance             General bed mobility comments: Pt very limited by SOB on this date. OT deferred mobility attempts within RN suggestion.  Transfers                      Balance Overall balance assessment: Needs assistance;History of Falls                                         ADL either performed or assessed with clinical judgement   ADL Overall ADL's : Needs assistance/impaired  General ADL Comments: Pt demonstrates impairments with activity tolerance. He becomes SOB with even limited bed-level activity on this date, despite being on 3L [place]. ADL assessment limited due to pt cardiopulmonary status. At this time req. at least min-moderate A for ADL mgt, with consistent cueing to [employer].     Vision Baseline Vision/History: Wears glasses Wears Glasses: At all times Patient Visual Report: No change from baseline Additional Comments: At end of session, pt began to complain of 10/10 pain behind his R eye. OT attempted to assess visual tracking and pt noted go have difficulty follwoing a moving  stimulus without moving his head. Both eyes regularly jumped back to midline, unable to smoothly track in all quadrants on this date.     Perception     Praxis      Pertinent Vitals/Pain Pain Assessment: 0-10 Pain Score: 5  Pain Location: Pt endorsing 5/10 RLQ pain for majority of session. At end of session pt stated he had 10/10 sharp shooting pain behind his R eye. RN in to assess. Pain Descriptors / Indicators: Constant;Discomfort Pain Intervention(s): Limited activity within patient's tolerance;Monitored during session;Utilized relaxation techniques     Hand Dominance Right   Extremity/Trunk Assessment Upper Extremity Assessment Upper Extremity Assessment: Generalized weakness   Lower Extremity Assessment Lower Extremity Assessment: Generalized weakness;Defer to PT evaluation       Communication Communication Communication: No difficulties   Cognition Arousal/Alertness: Awake/alert Behavior During Therapy: WFL for tasks assessed/performed Overall Cognitive Status: Impaired/Different from baseline                                 General Comments: 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 After evaluation, this OT spoke with pt's wife who stated pt has never worked on a farm and the only animals they have at home are their 4 dogs. Pt seemed otherwise A&Ox4. Was able to identify why he at [facility] and give a near accurate date.   General Comments       Exercises Other Exercises Other Exercises: Pt educated on ECS to support ADL mgt and participation. Pt return demonstrated understanding of PLB strategy on this date. Needs ECS handout.   Shoulder Instructions      Home Living Family/patient expects to be discharged to:: Private residence Living Arrangements: Spouse/significant other Available Help at Discharge: Family Type of Home: House Home Access: Stairs to enter [employer] of Steps: 3 at front 5 at back Entrance Stairs-Rails: Can reach both;Left;Right Home Layout: One level     Bathroom Shower/Tub: Occupational psychologist: Handicapped height Bathroom Accessibility: Yes   Home Equipment: Grab bars - toilet;Walker - 2 wheels;Cane - single point;Shower seat - built in;Grab bars - tub/shower;Walker - 4 wheels          Prior Functioning/Environment Level of Independence: Independent        Comments: Per chart pt has had multiple falls in past six months including one immediately prior to his prior R THA in May. On this date he states he has had no falls in past 6 months.        OT Problem List: Decreased strength;Decreased coordination;Cardiopulmonary status limiting activity;Pain;Decreased range of motion;Decreased cognition;Decreased activity tolerance;Decreased safety awareness;Decreased knowledge of use of DME or AE;Impaired balance (sitting and/or standing)      OT Treatment/Interventions: Self-care/ADL training;Therapeutic exercise;Therapeutic activities;Energy conservation;DME  and/or AE instruction;Patient/family education;Balance training    OT Goals(Current goals can be found in the care plan section) Acute Rehab OT Goals Patient Stated Goal: To go home OT Goal Formulation: With patient Time For Goal Achievement: 10/29/18 Potential to Achieve Goals: Good ADL Goals Pt Will Perform Grooming: sitting;with modified independence(With LRAD PRN for improved safety and functional independence.) Pt Will Perform Upper Body Dressing: sitting;with adaptive equipment;with modified independence(With LRAD PRN for improved safety and functional independence.) Pt Will Perform Lower Body Dressing: with modified independence;sit to/from stand;with adaptive equipment(With LRAD PRN for improved safety and functional independence.) Additional ADL Goal #1: Pt will independently verbalize a plan to implement at least 3 learned ECS into his  daily routines for improved safety, independence, and activity tolerance upon hospital DC.  OT Frequency: Min 2X/week   Barriers to D/C: Inaccessible home environment          Co-evaluation              AM-PAC OT "6 Clicks" Daily Activity     Outcome Measure Help from another person eating meals?: None Help from another person taking care of personal grooming?: A Little Help from another person toileting, which includes using toliet, bedpan, or urinal?: A Lot Help from another person bathing (including washing, rinsing, drying)?: A Lot Help from another person to put on and taking off regular upper body clothing?: A Little Help from another person to put on and taking off regular lower body clothing?: A Little 6 Click Score: 17   End of Session Nurse Communication: Other (comment)(Per pt wife, pt does not have farm and may be confused. RN notified.)  Activity Tolerance: Patient limited by fatigue Patient left: in bed;with call bell/phone within reach;with bed alarm set;with nursing/sitter in room(Pt having 10/10 r sided headache RN in room to assess.)  OT Visit Diagnosis: Muscle weakness (generalized) (M62.81);History of falling (Z91.81);Other abnormalities of gait and mobility (R26.89)                Time: 6384-6659 OT Time Calculation (min): 24 min Charges:  OT General Charges $OT Visit: 1 Visit OT Evaluation $OT Eval Moderate Complexity: 1 Mod OT Treatments $Self Care/Home Management : 8-22 mins  Shara Blazing, M.S., OTR/L Ascom: [phone] 10/15/18, 3:38 PM

## 2018-10-15 NOTE — Consult Note (Addendum)
[facility] CONSULT NOTE  Patient Care Team: Zeb Comfort, MD as PCP - General (Family Medicine)  CHIEF COMPLAINTS/PURPOSE OF CONSULTATION:  Lung nodule/lung cancer  HISTORY OF PRESENTING ILLNESS:  Joseph Peters 63 y.o.  male patient with longstanding history of smoking/advanced COPD; and history of lung nodule is currently admitted to hospital for right lower quadrant abdominal pain.  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  Patient noted to have consolidation/infiltrate of the right upper lobe-which in fact precludes evaluation of the previously noted 1.5 cm lung nodule.  Of note patient was evaluated previously-June/July 2020-CT scan chest PET scan 1.8 cm right peripheral upper lobe lung nodule with SUV of 10.7.  Also another right upper lobe lung nodule up to 0.8 cm; with mild hilar adenopathy.  Patient was plan to get bronchoscopic procedure for tissue diagnosis.  Currently on hold because of admission.  Review of Systems  Constitutional: Positive for malaise/fatigue and weight loss. Negative for chills, diaphoresis and fever.  HENT: Negative for nosebleeds and sore throat.   Eyes: Negative for double vision.  Respiratory: Positive for cough, sputum production, shortness of breath and wheezing. Negative for hemoptysis.   Cardiovascular: Negative for chest pain, palpitations, orthopnea and leg swelling.  Gastrointestinal: Positive for abdominal pain. Negative for blood in stool, constipation, diarrhea, heartburn, melena, nausea and vomiting.  Genitourinary: Negative for dysuria, frequency and urgency.  Musculoskeletal: Negative for back pain and joint pain.  Skin: Negative.  Negative for itching and rash.  Neurological: Negative for dizziness, tingling, focal weakness, weakness and headaches.  Endo/Heme/Allergies: Does  not bruise/bleed easily.  Psychiatric/Behavioral: Negative for depression. The patient is not nervous/anxious and does not have insomnia.      MEDICAL HISTORY:  Past Medical History:  Diagnosis Date  . AAA (abdominal aortic aneurysm) without rupture ([place])   . Abdominal aneurysm ([place]) 2015   being followed but not large enough to surgically treat  . Anxiety   . Arthritis   . Asthma   . Benign hypertension 07/16/2014  . Chest pain on exertion 07/16/2014  . Chronic pain syndrome   . Coccydynia 02/08/2012  . COPD (chronic obstructive pulmonary disease) ([place])    patient smokes  . Coronary artery disease   . 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 Depression   . 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 IBS (irritable bowel syndrome)   . Myocardial infarction ([place]) 2010  . Restless leg syndrome   . Sciatica   . Spinal stenosis of lumbar region   . Stroke [facility]) 2015   no residual symptoms    SURGICAL HISTORY: Past Surgical History:  Procedure Laterality Date  . CARDIAC CATHETERIZATION  2012  . COLONOSCOPY WITH PROPOFOL N/A 11/14/2016   Procedure: COLONOSCOPY WITH PROPOFOL;  Surgeon: Manya Silvas, MD;  Location: [facility] ENDOSCOPY;  Service: Endoscopy;  Laterality: N/A;  . ESOPHAGOGASTRODUODENOSCOPY (EGD) WITH PROPOFOL N/A 11/14/2016   Procedure: ESOPHAGOGASTRODUODENOSCOPY (EGD) WITH PROPOFOL;  Surgeon: Manya Silvas, MD;  Location: [facility] ENDOSCOPY;  Service: Endoscopy;  Laterality: N/A;  . KNEE SURGERY     right  . SHOULDER ARTHROSCOPY WITH OPEN ROTATOR CUFF REPAIR Right 08/20/2017   Procedure: SHOULDER ARTHROSCOPY WITH OPEN ROTATOR CUFF REPAIR;  Surgeon: Corky Mull, MD;  Location: [facility] ORS;  Service: Orthopedics;  Laterality: Right;  . SHOULDER ARTHROSCOPY  WITH SUBACROMIAL DECOMPRESSION, ROTATOR CUFF REPAIR AND BICEP TENDON REPAIR Left 05/10/2016   Procedure: SHOULDER ARTHROSCOPY WITH DEBTRIDEMENT, DECOMPRESSION, REPAIR OF MASSIVE ROTATOR CUFF  TEAR AND BICEP TENODESIS;  Surgeon: Corky Mull, MD;  Location: [facility] ORS;  Service: Orthopedics;  Laterality: Left;  Massive cuff tear  . TOTAL HIP ARTHROPLASTY Right 07/01/2018   Procedure: TOTAL HIP ARTHROPLASTY - RIGHT;  Surgeon: Corky Mull, MD;  Location: [facility] ORS;  Service: Orthopedics;  Laterality: Right;    SOCIAL HISTORY: Social History   Socioeconomic History  . Marital status: Married    Spouse name: Not on file  . Number of children: Not on file  . Years of education: Not on file  . Highest education level: Not on file  Occupational History  . Not on file  Social Needs  . Financial resource strain: Not on file  . Food insecurity    Worry: Not on file    Inability: Not on file  . Transportation needs    Medical: Not on file    Non-medical: Not on file  Tobacco Use  . Smoking status: Current Some Day Smoker    Packs/day: 0.50    Years: 30.00    Pack years: 15.00    Types: Cigarettes  . Smokeless tobacco: Former Systems developer    Quit date: 02/26/1998  Substance and Sexual Activity  . Alcohol use: Yes    Alcohol/week: 0.0 standard drinks    Comment: beer daily  . Drug use: No  . Sexual activity: Not on file  Lifestyle  . Physical activity    Days per week: Not on file    Minutes per session: Not on file  . Stress: Not on file  Relationships  . Social Herbalist on phone: Not on file    Gets together: Not on file    Attends religious service: Not on file    Active member of club or organization: Not on file    Attends meetings of clubs or organizations: Not on file    Relationship status: Not on file  . Intimate partner violence    Fear of current or ex partner: Not on file    Emotionally abused: Not on file    Physically abused: Not on file    Forced sexual activity: Not on file  Other Topics Concern  . Not on file  Social History Narrative   Patient is a Nature conservation officer.;  He lives with his wife at home.  Longstanding history of smoking; quit  recently.  Denies alcohol abuse.    FAMILY HISTORY: Family History  Problem Relation Age of Onset  . Cancer Mother   . COPD Father 12       black lung    ALLERGIES:  is allergic to penicillins and lidocaine.  MEDICATIONS:  Current Facility-Administered Medications  Medication Dose Route Frequency Provider Last Rate Last Dose  . diazepam (VALIUM) tablet 5 mg  5 mg Oral QHS PRN Lang Snow, NP      . enoxaparin (LOVENOX) injection 40 mg  40 mg Subcutaneous Q24H Lang Snow, NP   40 mg at 10/14/18 2310  . feeding supplement (ENSURE ENLIVE) (ENSURE ENLIVE) liquid 237 mL  237 mL Oral TID BM Gouru, Aruna, MD      . finasteride (PROSCAR) tablet 5 mg  5 mg Oral Daily Lang Snow, NP   5 mg at 10/15/18 0858  . FLUoxetine (PROZAC) capsule 20 mg  20 mg Oral Daily  Lang Snow, NP   20 mg at 10/15/18 0859  . gabapentin (NEURONTIN) capsule 900 mg  900 mg Oral QHS Lang Snow, NP   900 mg at 10/14/18 2310  . hydrALAZINE (APRESOLINE) injection 10 mg  10 mg Intravenous Q6H PRN Gardiner Barefoot H, NP   10 mg at 10/15/18 0201  . ipratropium-albuterol (DUONEB) 0.5-2.5 (3) MG/3ML nebulizer solution 3 mL  3 mL Nebulization Q6H Lang Snow, NP   3 mL at 10/15/18 1328  . levofloxacin (LEVAQUIN) IVPB 750 mg  750 mg Intravenous Q24H Lang Snow, NP   Stopped at 10/15/18 0158  . losartan (COZAAR) tablet 25 mg  25 mg Oral Daily Lang Snow, NP   25 mg at 10/15/18 0859  . montelukast (SINGULAIR) tablet 10 mg  10 mg Oral QHS Lang Snow, NP   10 mg at 10/14/18 2309  . morphine 2 MG/ML injection 2 mg  2 mg Intravenous NOW Seals, Theo Dills, NP      . Derrill Memo ON 10/16/2018] multivitamin with minerals tablet 1 tablet  1 tablet Oral Daily Gouru, Aruna, MD      . nicotine (NICODERM CQ - dosed in mg/24 hours) patch 21 mg  21 mg Transdermal Daily Lang Snow, NP   21 mg at 10/14/18 2307  . oxyCODONE (Oxy  IR/ROXICODONE) immediate release tablet 10 mg  10 mg Oral QID PRN Lang Snow, NP   10 mg at 10/14/18 1627  . pantoprazole (PROTONIX) EC tablet 40 mg  40 mg Oral Daily Lang Snow, NP   40 mg at 10/15/18 0859  . predniSONE (DELTASONE) tablet 40 mg  40 mg Oral Q breakfast Lang Snow, NP   40 mg at 10/15/18 0859  . simvastatin (ZOCOR) tablet 40 mg  40 mg Oral QHS Lang Snow, NP   40 mg at 10/15/18 0002  . tamsulosin (FLOMAX) capsule 0.4 mg  0.4 mg Oral QPC supper Lang Snow, NP      . tiZANidine (ZANAFLEX) tablet 4 mg  4 mg Oral TID PRN Lang Snow, NP      . vitamin C (ASCORBIC ACID) tablet 250 mg  250 mg Oral BID Gouru, Illene Silver, MD          .  PHYSICAL EXAMINATION:  Vitals:   10/15/18 1328 10/15/18 1410  BP:  (!) 144/84  Pulse:  96  Resp:  (!) 28  Temp:    SpO2: 97% 99%   Filed Weights   10/14/18 0631 10/14/18 2145  Weight: 129 lb (58.5 kg) 121 lb 11.1 oz (55.2 kg)    Physical Exam  Constitutional: He is oriented to person, place, and time and well-developed, well-nourished, and in no distress.  Thin built Caucasian male patient.  Alone.  HENT:  Head: Normocephalic and atraumatic.  Mouth/Throat: Oropharynx is clear and moist. No oropharyngeal exudate.  Eyes: Pupils are equal, round, and reactive to light.  Neck: Normal range of motion. Neck supple.  Cardiovascular: Normal rate and regular rhythm.  Pulmonary/Chest: No respiratory distress. He has no wheezes.  Decreased air entry bilaterally.  Scattered wheeze no crackles.  Abdominal: Soft. Bowel sounds are normal. He exhibits no distension and no mass. There is no abdominal tenderness. There is no rebound and no guarding.  Musculoskeletal: Normal range of motion.        General: No tenderness or edema.  Neurological: He is alert and oriented to person, place, and time.  Skin: Skin is  warm.  Psychiatric: Affect normal.     LABORATORY DATA:  I have  reviewed the data as listed Lab Results  Component Value Date   WBC 11.9 (H) 10/14/2018   HGB 14.9 10/14/2018   HCT 44.4 10/14/2018   MCV 91.4 10/14/2018   PLT 303 10/14/2018   Recent Labs    07/01/18 0612 07/02/18 0441 10/14/18 0640  NA 136 133* 134*  K 3.9 4.4 4.4  CL 102 102 101  CO2 22 22 24   GLUCOSE 98 158* 103*  BUN 12 14 7*  CREATININE 1.28* 1.09 1.03  CALCIUM 9.1 8.4* 9.1  GFRNONAA 60* >60 >60  GFRAA >60 >60 >60  PROT  --   --  7.3  ALBUMIN  --   --  3.3*  AST  --   --  18  ALT  --   --  10  ALKPHOS  --   --  94  BILITOT  --   --  1.0    RADIOGRAPHIC STUDIES: I have personally reviewed the radiological images as listed and agreed with the findings in the report. Dg Chest 2 View  Result Date: 10/14/2018 CLINICAL DATA:  Chest and abdominal pain. EXAM: CHEST - 2 VIEW COMPARISON:  07/01/2018 FINDINGS: Airspace disease in the right upper lobe in the area of prior nodule seen on CT. Severe underlying COPD/[place]. Heart is normal size. Left lung clear. No effusions or acute bony abnormality. IMPRESSION: Severe COPD. Peripheral airspace disease in the right upper lobe in the area of prior pulmonary nodule. This could reflect pneumonia. Followup PA and lateral chest X-ray is recommended in 3-4 weeks following trial of antibiotic therapy to ensure resolution and exclude underlying malignancy. Electronically Signed   By: Rolm Baptise M.D.   On: 10/14/2018 08:08   Ct Head Wo Contrast  Result Date: 10/15/2018 CLINICAL DATA:  Right eye pain.  Possible facial droop. EXAM: CT HEAD WITHOUT CONTRAST TECHNIQUE: Contiguous axial images were obtained from the base of the skull through the vertex without intravenous contrast. COMPARISON:  None. FINDINGS: Brain: No evidence of acute infarction, hemorrhage, hydrocephalus, extra-axial collection or mass lesion/mass effect. Vascular: No hyperdense vessel or unexpected calcification. Skull: Normal. Negative for fracture or focal lesion.  Sinuses/Orbits: No acute finding. Other: None. IMPRESSION: No acute intracranial findings. Electronically Signed   By: Davina Poke M.D.   On: 10/15/2018 15:00   Dg Chest Port 1 View  Result Date: 10/14/2018 CLINICAL DATA:  Right lower quadrant pain and emesis. EXAM: PORTABLE CHEST 1 VIEW COMPARISON:  October 14, 2018 FINDINGS: Cardiomediastinal silhouette is normal. Mediastinal contours appear intact. Advanced upper lobe predominant emphysema. Persistent large area of subpleural airspace consolidation in the right upper lobe, in the site of previously seen pulmonary nodule. Osseous structures are without acute abnormality. Soft tissues are grossly normal. IMPRESSION: 1. Persistent large area of subpleural airspace consolidation in the right upper lobe, at the site of previously seen pulmonary nodule. 2. Advanced upper lobe predominant emphysema. Electronically Signed   By: Fidela Salisbury M.D.   On: 10/14/2018 15:46   Ct Angio Chest/abd/pel For Dissection W And/or Wo Contrast  Result Date: 10/14/2018 CLINICAL DATA:  History of AAA, COPD, HTN, here with severe abd pain. Pt was in usual state of health until last night. He states he ate soup for dinner then felt fine, was going to bed around midnight when he had multiple episodes of coughing and vomiting. He had no abd pain at the time however. He awoke this  morning with ongoing nausea but no additional vomiting, along with severe burning, aching, epigastric and R sided abd pain. The pain then became primarily RLQ. He's had subjective chills but no fevers. No diarrhea. Pain is worse w/ palpation and eating EXAM: CT ANGIOGRAPHY CHEST, ABDOMEN AND PELVIS TECHNIQUE: Multidetector CT imaging through the chest, abdomen and pelvis was performed using the standard protocol during bolus administration of intravenous contrast. Multiplanar reconstructed images and MIPs were obtained and reviewed to evaluate the vascular anatomy. CONTRAST:  38mL OMNIPAQUE IOHEXOL  350 MG/ML SOLN COMPARISON:  08/13/2018 and previous FINDINGS: CTA CHEST FINDINGS Cardiovascular: Heart size normal. No pericardial effusion. RV is nondilated. Fair contrast opacification of pulmonary artery branches; the exam was not optimized for detection of pulmonary emboli. Coronary calcifications. Aortic valve leaflet calcifications. Good contrast opacification of the thoracic aorta. No dissection or stenosis. Maximum transverse dimensions as follows: 3.8 cm sinuses of Valsalva 3.1 cm sino-tubular junction 4.4 cm mid ascending (previously 4.2) 3.4 cm distal ascending/proximal arch 2.8 cm distal arch 2.7 cm proximal descending 3.7 cm distal descending saccular aneurysm vs penetrating atheromatous ulcer (previously 3.4) 2.8 cm supra celiac Mediastinum/Nodes: No mediastinal hematoma. 1.2 cm right hilar lymph node image 51/6, previously 0.9 cm. No mediastinal adenopathy. Lungs/Pleura: No pleural effusion. No pneumothorax. Advanced pulmonary emphysema with new patchy peripheral airspace consolidation in the anterior right upper lobe. This obscures the right upper lobe nodules described previously. 0.6 cm probable intrapulmonary lymph node image 92/7, stable. Left lung clear. Musculoskeletal: Stable mild midthoracic compression deformity. No fracture or worrisome bone lesion. Review of the MIP images confirms the above findings. CTA ABDOMEN AND PELVIS FINDINGS VASCULAR Aorta: Moderate calcified atheromatous plaque. No dissection or stenosis. Fusiform infrarenal aneurysm 4.8 x 4.4 cm maximum transverse dimensions, previously 3.7 cm on 12/01/2015 by my measurement. Aneurysm terminates at the bifurcation. Extensive nonocclusive mural thrombus in the aneurysmal segment. No evidence of rupture. Celiac: Short-segment origin stenosis of possible hemodynamic significance, patent distally. 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 No stenosis. Distal branching otherwise unremarkable. Renals: Single  left, with ostial plaque resulting in short segment stenosis of at least mild severity. Duplicated right renal arteries. The superior right renal artery is dominant, and occluded from its origin over length of at least 2 cm, reconstituted distally. The inferior right renal artery is aberrant and diminutive, arising from the aneurysmal segment of the aorta at the same level of the IMA, remains patent. IMA: Arises from the aneurysmal segment of the aorta. Patent without evidence of aneurysm, dissection, vasculitis or significant stenosis. Inflow: On the right, Short-segment origin stenosis of the common iliac artery of possible hemodynamic significance. Calcified eccentric plaque extends distally through its length without tandem significant stenosis or aneurysm. Internal iliac is atheromatous, patent. Right external iliac and common femoral arteries patent. On the left, calcified atheromatous plaque through the length of the common and internal iliac arteries without high-grade stenosis or aneurysm. External iliac and common femoral patent. Veins: No obvious venous abnormality within the limitations of this arterial phase study. Review of the MIP images confirms the above findings. NON-VASCULAR Hepatobiliary: No focal liver abnormality is seen. No gallstones, gallbladder wall thickening, or biliary dilatation. Pancreas: Unremarkable. No pancreatic ductal dilatation or surrounding inflammatory changes. Spleen: Normal in size without focal abnormality. Adrenals/Urinary Tract: Normal adrenals. Progressive right renal parenchymal atrophy with poor enhancement of the upper pole likely related to superior right renal artery proximal occlusion. No hydronephrosis. No solid renal mass. Urinary bladder physiologically distended.  Stomach/Bowel: Stomach is nondistended. Small bowel decompressed. Normal appendix. The colon is nondilated, unremarkable. Lymphatic: No abdominal or pelvic adenopathy. Reproductive: Prostate is  unremarkable. Other: No ascites. No free air. Musculoskeletal: Asymmetric thickening of the crus of the diaphragm on the right stable since 12/01/2015. Lower lumbar spondylitic change. No fracture or worrisome bone lesion. Right hip arthroplasty without dislocation, hardware incompletely visualized. Review of the MIP images confirms the above findings. IMPRESSION: 1. No acute PE, thoracic aortic dissection, or aneurysm rupture. 2. Slight enlargement of 4.4 cm ascending thoracic aortic aneurysm. Recommend annual imaging followup by CTA or MRA. This recommendation follows 2010 ACCF/AHA/AATS/ACR/ASA/SCA/SCAI/SIR/STS/SVM Guidelines for the Diagnosis and Management of Patients with Thoracic Aortic Disease. Circulation. 2010; 121: Q761-P509. Aortic aneurysm NOS (ICD10-I71.9) 3. 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 4. Enlarging 4.8 cm fusiform infrarenal abdominal aortic aneurysm (previously 3.7 cm in 2017). Recommend followup by abdomen and pelvis CTA in 6 months, and vascular surgery referral/consultation if not already obtained. This recommendation follows ACR consensus guidelines: White Paper of the ACR Incidental Findings Committee II on Vascular Findings. J Am Coll Radiol 2013; 10:789-794. Aortic aneurysm NOS (ICD10-I71.9) 5. Short-segment origin stenosis of the right common iliac artery of possible hemodynamic significance. Correlate with clinical symptomatology and ABIs. 6. 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 Aortic aneurysm NOS (ICD10-I71.9). Aortic Atherosclerosis (ICD10-I70.0) and Emphysema (ICD10-J43.9). Electronically Signed   By: Lucrezia Europe M.D.   On: 10/14/2018 09:44    Nodule of upper lobe of right lung #63 year old male patient with multiple co-morbidities-is currently admitted to hospital for abdominal pain/shortness of breath  #Right upper lobe lung nodule  up to 1.8 cm in size/PET positive-concerning for malignancy question hilar lymph nodes [July 2020].  See plan below  #Worsening shortness of breath-interstitial lung disease /COPD/right upper lobe pneumonia-antibiotics/breathing treatments.  Followed by pulmonary.  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  #Plan: Given the history of smoking/PET positive lung nodule in July-suspicious for malignancy.  However in the context of his worsening respiratory status/active lung infection-biopsy is not recommended especially given his tenuous respiratory status at this time.  Recommend improvement of his respiratory status/prior to proceeding with any biopsy.  We will also discuss the tumor conference at next week.  Discussed with Dr. Lanney Gins.   Thank you Dr. Lanney Gins for allowing me to participate in the care of your pleasant patient. Please do not hesitate to contact me with questions or concerns in the interim. I will also speak to pt's wife, [name].   # I reviewed the blood work- with the patient in detail; also reviewed the imaging independently [as summarized above]; and with the patient in detail.     All questions were answered. The patient knows to call the clinic with any problems, questions or concerns.    Cammie Sickle, MD 10/15/2018 4:52 PM

## 2018-10-15 NOTE — Progress Notes (Signed)
Patient complained of sharp 10/10 pain behind right eye. Patient displaying asymmetrical smile with right side slightly elevated compared to left side. Bilateral hand grips normal. Right leg weaker than the left leg, but could be due to recent right hip replacement. Patient also having tremors. Per Dr. Margaretmary Eddy, order head CT and q4 Neuro checks.   Fuller Mandril, RN

## 2018-10-15 NOTE — Progress Notes (Signed)
Pulmonary Medicine          Date: 10/15/2018,   MRN# [id number] [facility] 1956/01/31     AdmissionWeight: 58.5 kg                 CurrentWeight: 55.2 kg      CHIEF COMPLAINT:   Moderate acute COPD exacerbation and recent findings of right lung spiculated nodules with hilar/mediastinal lymphadenopathy suggestive of primary bronchogenic carcinoma   SUBJECTIVE    Patient had episode of acute facial and periorbital pain with concomitant respiratory distress which resolved after resting 15 minutes in bed. He was thought to possibly have TIA and neurologist evaluated with recommendation to treat for cluster headache and no additional imaging as CTH today was normal.   He has severe anxiety regarding dying from cancer. I have assured him that we must do what is necessary to assure safety due to severe bullous emphysema and thoracic/abdominal aneurysm when approaching diagnostic plan for mediastinal staging/tissue diagnosis.      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   Depression    Disc disease with myelopathy, 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   IBS (irritable bowel syndrome)    Myocardial infarction ([place]) 2010   Restless leg syndrome    Sciatica    Spinal stenosis of lumbar region    Stroke [facility]) 2015   no residual symptoms     SURGICAL HISTORY   Past Surgical History:  Procedure Laterality Date   CARDIAC CATHETERIZATION  2012   COLONOSCOPY WITH PROPOFOL N/A  11/14/2016   Procedure: COLONOSCOPY WITH PROPOFOL;  Surgeon: Manya Silvas, MD;  Location: [facility] ENDOSCOPY;  Service: Endoscopy;  Laterality: N/A;   ESOPHAGOGASTRODUODENOSCOPY (EGD) WITH PROPOFOL N/A 11/14/2016   Procedure: ESOPHAGOGASTRODUODENOSCOPY (EGD) WITH PROPOFOL;  Surgeon: Manya Silvas, MD;  Location: [facility] ENDOSCOPY;  Service: Endoscopy;  Laterality: N/A;   KNEE SURGERY     right   SHOULDER ARTHROSCOPY WITH OPEN ROTATOR CUFF REPAIR Right 08/20/2017   Procedure: SHOULDER ARTHROSCOPY WITH OPEN ROTATOR CUFF REPAIR;  Surgeon: Corky Mull, MD;  Location: [facility] ORS;  Service: Orthopedics;  Laterality: Right;   SHOULDER ARTHROSCOPY WITH SUBACROMIAL DECOMPRESSION, ROTATOR CUFF REPAIR AND BICEP TENDON REPAIR Left 05/10/2016   Procedure: SHOULDER ARTHROSCOPY WITH DEBTRIDEMENT, DECOMPRESSION, REPAIR OF MASSIVE ROTATOR CUFF TEAR AND BICEP TENODESIS;  Surgeon: Corky Mull, MD;  Location: [facility] ORS;  Service: Orthopedics;  Laterality: Left;  Massive cuff tear   TOTAL HIP ARTHROPLASTY Right 07/01/2018   Procedure: TOTAL HIP ARTHROPLASTY - RIGHT;  Surgeon: Corky Mull, MD;  Location: [facility] ORS;  Service: Orthopedics;  Laterality: Right;     FAMILY HISTORY   Family History  Problem Relation Age of Onset   Cancer Mother    COPD Father 67       black lung     SOCIAL HISTORY   Social History   Tobacco Use   Smoking status: Current Some Day  Smoker    Packs/day: 0.50    Years: 30.00    Pack years: 15.00    Types: Cigarettes   Smokeless tobacco: Former Systems developer    Quit date: 02/26/1998  Substance Use Topics   Alcohol use: Yes    Alcohol/week: 0.0 standard drinks    Comment: beer daily   Drug use: No     MEDICATIONS    Home Medication:    Current Medication:  Current Facility-Administered Medications:    diazepam (VALIUM) tablet 5 mg, 5 mg, Oral, QHS PRN, Ouma, Bing Neighbors, NP   enoxaparin (LOVENOX) injection 40 mg, 40 mg, Subcutaneous, Q24H, Ouma, Bing Neighbors, NP, 40 mg at 10/14/18 2310   feeding supplement (ENSURE ENLIVE) (ENSURE ENLIVE) liquid 237 mL, 237 mL, Oral, TID BM, Gouru, Aruna, MD   finasteride (PROSCAR) tablet 5 mg, 5 mg, Oral, Daily, Ouma, Bing Neighbors, NP, 5 mg at 10/15/18 0858   FLUoxetine (PROZAC) capsule 20 mg, 20 mg, Oral, Daily, Ouma, Bing Neighbors, NP, 20 mg at 10/15/18 0859   gabapentin (NEURONTIN) capsule 900 mg, 900 mg, Oral, QHS, Ouma, Bing Neighbors, NP, 900 mg at 10/14/18 2310   hydrALAZINE (APRESOLINE) injection 10 mg, 10 mg, Intravenous, Q6H PRN, Seals, Angela H, NP, 10 mg at 10/15/18 0201   ipratropium-albuterol (DUONEB) 0.5-2.5 (3) MG/3ML nebulizer solution 3 mL, 3 mL, Nebulization, Q6H, Ouma, Bing Neighbors, NP, 3 mL at 10/15/18 1328   levofloxacin (LEVAQUIN) IVPB 750 mg, 750 mg, Intravenous, Q24H, Ouma, Bing Neighbors, NP, Stopped at 10/15/18 0158   losartan (COZAAR) tablet 25 mg, 25 mg, Oral, Daily, Ouma, Bing Neighbors, NP, 25 mg at 10/15/18 0859   montelukast (SINGULAIR) tablet 10 mg, 10 mg, Oral, QHS, Ouma, Bing Neighbors, NP, 10 mg at 10/14/18 2309   morphine 2 MG/ML injection 2 mg, 2 mg, Intravenous, NOW, Seals, Theo Dills, NP   [START ON 10/16/2018] multivitamin with minerals tablet 1 tablet, 1 tablet, Oral, Daily, Gouru, Aruna, MD   nicotine (NICODERM CQ - dosed in mg/24 hours) patch 21 mg, 21 mg, Transdermal, Daily, Ouma, Bing Neighbors, NP, 21 mg at 10/14/18 2307   oxyCODONE (Oxy IR/ROXICODONE) immediate release tablet 10 mg, 10 mg, Oral, QID PRN, Lang Snow, NP, 10 mg at 10/14/18 1627   pantoprazole (PROTONIX) EC tablet 40 mg, 40 mg, Oral, Daily, Ouma, Bing Neighbors, NP, 40 mg at 10/15/18 0859   predniSONE (DELTASONE) tablet 40 mg, 40 mg, Oral, Q breakfast, Ouma, Bing Neighbors, NP, 40 mg at 10/15/18 0859   simvastatin (ZOCOR) tablet 40 mg, 40 mg, Oral, QHS, Ouma, Bing Neighbors, NP, 40 mg at 10/15/18 0002   tamsulosin (FLOMAX) capsule 0.4  mg, 0.4 mg, Oral, QPC supper, Ouma, Bing Neighbors, NP   tiZANidine (ZANAFLEX) tablet 4 mg, 4 mg, Oral, TID PRN, Lang Snow, NP   vitamin C (ASCORBIC ACID) tablet 250 mg, 250 mg, Oral, BID, Gouru, Aruna, MD    ALLERGIES   Penicillins and Lidocaine     REVIEW OF SYSTEMS    Review of Systems:  Gen:  Denies  fever, sweats, chills weigh loss  HEENT: Denies blurred vision, double vision, ear pain, eye pain, hearing loss, nose bleeds, sore throat Cardiac:  No dizziness, chest pain or heaviness, chest tightness,edema Resp:   Denies cough or sputum porduction, shortness of breath,wheezing, hemoptysis,  Gi: Denies swallowing difficulty, stomach pain, nausea or vomiting, diarrhea, constipation, bowel incontinence Gu:  Denies bladder incontinence, burning urine Ext:   Denies Joint pain, stiffness or swelling Skin: Denies  skin rash, easy bruising or bleeding or hives Endoc:  Denies polyuria, polydipsia , polyphagia or weight change Psych:   Denies depression, insomnia or hallucinations   Other:  All other systems negative   VS: BP (!) 144/84 (BP Location: Right Arm)    Pulse 96    Temp 97.7 F (36.5 C) (Oral)    Resp (!) 28    Ht 5\' 6"  (1.676 m)    Wt 55.2 kg    SpO2 99%    BMI 19.64 kg/m      PHYSICAL EXAM    GENERAL:NAD, no fevers, chills, no weakness no fatigue HEAD: Normocephalic, atraumatic.  EYES: Pupils equal, round, reactive to light. Extraocular muscles intact. No scleral icterus.  MOUTH: Moist mucosal membrane. Dentition intact. No abscess noted.  EAR, NOSE, THROAT: Clear without exudates. No external lesions.  NECK: Supple. No thyromegaly. No nodules. No JVD.  PULMONARY: Decreased breath sounds with mild rhonchorous sounds appreciated at the right lung base CARDIOVASCULAR: S1 and S2. Regular rate and rhythm. No murmurs, rubs, or gallops. No edema. Pedal pulses 2+ bilaterally.  GASTROINTESTINAL: Soft, nontender, nondistended. No masses. Positive bowel  sounds. No hepatosplenomegaly.  MUSCULOSKELETAL: No swelling, clubbing, or edema. Range of motion full in all extremities.  NEUROLOGIC: Cranial nerves II through XII are intact. No gross focal neurological deficits. Sensation intact. Reflexes intact.  SKIN: No ulceration, lesions, rashes, or cyanosis. Skin warm and dry. Turgor intact.  PSYCHIATRIC: Mood, affect within normal limits. The patient is awake, alert and oriented x 3. Insight, judgment intact.       IMAGING    Dg Chest 2 View  Result Date: 10/14/2018 CLINICAL DATA:  Chest and abdominal pain. EXAM: CHEST - 2 VIEW COMPARISON:  07/01/2018 FINDINGS: Airspace disease in the right upper lobe in the area of prior nodule seen on CT. Severe underlying COPD/[place]. Heart is normal size. Left lung clear. No effusions or acute bony abnormality. IMPRESSION: Severe COPD. Peripheral airspace disease in the right upper lobe in the area of prior pulmonary nodule. This could reflect pneumonia. Followup PA and lateral chest X-ray is recommended in 3-4 weeks following trial of antibiotic therapy to ensure resolution and exclude underlying malignancy. Electronically Signed   By: Rolm Baptise M.D.   On: 10/14/2018 08:08   Ct Head Wo Contrast  Result Date: 10/15/2018 CLINICAL DATA:  Right eye pain.  Possible facial droop. EXAM: CT HEAD WITHOUT CONTRAST TECHNIQUE: Contiguous axial images were obtained from the base of the skull through the vertex without intravenous contrast. COMPARISON:  None. FINDINGS: Brain: No evidence of acute infarction, hemorrhage, hydrocephalus, extra-axial collection or mass lesion/mass effect. Vascular: No hyperdense vessel or unexpected calcification. Skull: Normal. Negative for fracture or focal lesion. Sinuses/Orbits: No acute finding. Other: None. IMPRESSION: No acute intracranial findings. Electronically Signed   By: Davina Poke M.D.   On: 10/15/2018 15:00   Dg Chest Port 1 View  Result Date: 10/14/2018 CLINICAL DATA:   Right lower quadrant pain and emesis. EXAM: PORTABLE CHEST 1 VIEW COMPARISON:  October 14, 2018 FINDINGS: Cardiomediastinal silhouette is normal. Mediastinal contours appear intact. Advanced upper lobe predominant emphysema. Persistent large area of subpleural airspace consolidation in the right upper lobe, in the site of previously seen pulmonary nodule. Osseous structures are without acute abnormality. Soft tissues are grossly normal. IMPRESSION: 1. Persistent large area of subpleural airspace consolidation in the right upper lobe, at the site of previously seen pulmonary nodule. 2. Advanced upper lobe predominant emphysema. Electronically Signed   By:  Fidela Salisbury M.D.   On: 10/14/2018 15:46   Ct Angio Chest/abd/pel For Dissection W And/or Wo Contrast  Result Date: 10/14/2018 CLINICAL DATA:  History of AAA, COPD, HTN, here with severe abd pain. Pt was in usual state of health until last night. He states he ate soup for dinner then felt fine, was going to bed around midnight when he had multiple episodes of coughing and vomiting. He had no abd pain at the time however. He awoke this morning with ongoing nausea but no additional vomiting, along with severe burning, aching, epigastric and R sided abd pain. The pain then became primarily RLQ. He's had subjective chills but no fevers. No diarrhea. Pain is worse w/ palpation and eating EXAM: CT ANGIOGRAPHY CHEST, ABDOMEN AND PELVIS TECHNIQUE: Multidetector CT imaging through the chest, abdomen and pelvis was performed using the standard protocol during bolus administration of intravenous contrast. Multiplanar reconstructed images and MIPs were obtained and reviewed to evaluate the vascular anatomy. CONTRAST:  36mL OMNIPAQUE IOHEXOL 350 MG/ML SOLN COMPARISON:  08/13/2018 and previous FINDINGS: CTA CHEST FINDINGS Cardiovascular: Heart size normal. No pericardial effusion. RV is nondilated. Fair contrast opacification of pulmonary artery branches; the exam was  not optimized for detection of pulmonary emboli. Coronary calcifications. Aortic valve leaflet calcifications. Good contrast opacification of the thoracic aorta. No dissection or stenosis. Maximum transverse dimensions as follows: 3.8 cm sinuses of Valsalva 3.1 cm sino-tubular junction 4.4 cm mid ascending (previously 4.2) 3.4 cm distal ascending/proximal arch 2.8 cm distal arch 2.7 cm proximal descending 3.7 cm distal descending saccular aneurysm vs penetrating atheromatous ulcer (previously 3.4) 2.8 cm supra celiac Mediastinum/Nodes: No mediastinal hematoma. 1.2 cm right hilar lymph node image 51/6, previously 0.9 cm. No mediastinal adenopathy. Lungs/Pleura: No pleural effusion. No pneumothorax. Advanced pulmonary emphysema with new patchy peripheral airspace consolidation in the anterior right upper lobe. This obscures the right upper lobe nodules described previously. 0.6 cm probable intrapulmonary lymph node image 92/7, stable. Left lung clear. Musculoskeletal: Stable mild midthoracic compression deformity. No fracture or worrisome bone lesion. Review of the MIP images confirms the above findings. CTA ABDOMEN AND PELVIS FINDINGS VASCULAR Aorta: Moderate calcified atheromatous plaque. No dissection or stenosis. Fusiform infrarenal aneurysm 4.8 x 4.4 cm maximum transverse dimensions, previously 3.7 cm on 12/01/2015 by my measurement. Aneurysm terminates at the bifurcation. Extensive nonocclusive mural thrombus in the aneurysmal segment. No evidence of rupture. Celiac: Short-segment origin stenosis of possible hemodynamic significance, patent distally. 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 No stenosis. Distal branching otherwise unremarkable. Renals: Single left, with ostial plaque resulting in short segment stenosis of at least mild severity. Duplicated right renal arteries. The superior right renal artery is dominant, and occluded from its origin over length of at least 2 cm,  reconstituted distally. The inferior right renal artery is aberrant and diminutive, arising from the aneurysmal segment of the aorta at the same level of the IMA, remains patent. IMA: Arises from the aneurysmal segment of the aorta. Patent without evidence of aneurysm, dissection, vasculitis or significant stenosis. Inflow: On the right, Short-segment origin stenosis of the common iliac artery of possible hemodynamic significance. Calcified eccentric plaque extends distally through its length without tandem significant stenosis or aneurysm. Internal iliac is atheromatous, patent. Right external iliac and common femoral arteries patent. On the left, calcified atheromatous plaque through the length of the common and internal iliac arteries without high-grade stenosis or aneurysm. External iliac and common femoral patent. Veins: No obvious venous abnormality within  the limitations of this arterial phase study. Review of the MIP images confirms the above findings. NON-VASCULAR Hepatobiliary: No focal liver abnormality is seen. No gallstones, gallbladder wall thickening, or biliary dilatation. Pancreas: Unremarkable. No pancreatic ductal dilatation or surrounding inflammatory changes. Spleen: Normal in size without focal abnormality. Adrenals/Urinary Tract: Normal adrenals. Progressive right renal parenchymal atrophy with poor enhancement of the upper pole likely related to superior right renal artery proximal occlusion. No hydronephrosis. No solid renal mass. Urinary bladder physiologically distended. Stomach/Bowel: Stomach is nondistended. Small bowel decompressed. Normal appendix. The colon is nondilated, unremarkable. Lymphatic: No abdominal or pelvic adenopathy. Reproductive: Prostate is unremarkable. Other: No ascites. No free air. Musculoskeletal: Asymmetric thickening of the crus of the diaphragm on the right stable since 12/01/2015. Lower lumbar spondylitic change. No fracture or worrisome bone lesion. Right hip  arthroplasty without dislocation, hardware incompletely visualized. Review of the MIP images confirms the above findings. IMPRESSION: 1. No acute PE, thoracic aortic dissection, or aneurysm rupture. 2. Slight enlargement of 4.4 cm ascending thoracic aortic aneurysm. Recommend annual imaging followup by CTA or MRA. This recommendation follows 2010 ACCF/AHA/AATS/ACR/ASA/SCA/SCAI/SIR/STS/SVM Guidelines for the Diagnosis and Management of Patients with Thoracic Aortic Disease. Circulation. 2010; 121: T517-O160. Aortic aneurysm NOS (ICD10-I71.9) 3. 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 4. Enlarging 4.8 cm fusiform infrarenal abdominal aortic aneurysm (previously 3.7 cm in 2017). Recommend followup by abdomen and pelvis CTA in 6 months, and vascular surgery referral/consultation if not already obtained. This recommendation follows ACR consensus guidelines: White Paper of the ACR Incidental Findings Committee II on Vascular Findings. J Am Coll Radiol 2013; 10:789-794. Aortic aneurysm NOS (ICD10-I71.9) 5. Short-segment origin stenosis of the right common iliac artery of possible hemodynamic significance. Correlate with clinical symptomatology and ABIs. 6. 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 Aortic aneurysm NOS (ICD10-I71.9). Aortic Atherosclerosis (ICD10-I70.0) and Emphysema (ICD10-J43.9). Electronically Signed   By: Lucrezia Europe M.D.   On: 10/14/2018 09:44       PET CT 09/01/18 IMPRESSION: 1. Hypermetabolic (max SUV 73.7) irregular solid 1.8 cm peripheral right upper lobe pulmonary nodule, compatible with primary bronchogenic carcinoma, increased in size from recent chest CT. 2. Hypermetabolic more central right upper lobe 0.8 cm solid pulmonary nodule, also increased in size, suspicious for pulmonary metastasis to the same lobe. 3. Hypermetabolic right hilar lymph node  suspicious for metastatic disease. No hypermetabolic mediastinal adenopathy. 4. No hypermetabolic distant metastatic disease. 5. Infrarenal 4.5 cm Abdominal Aortic Aneurysm (ICD10-I71.9). Recommend follow-up CTA of the abdomen and pelvis in 6 months and vascular surgery referral/consultation. This recommendation follows ACR consensus guidelines: White Paper of the ACR Incidental Findings Committee II on Vascular Findings. J Am Coll Radiol 2013; 10:789-794. 6. Aortic Atherosclerosis (ICD10-I70.0) and Emphysema (ICD10-J43.9).  pe  ASSESSMENT/PLAN   Moderate acute exacerbation of COPD -COPD care path -Levaquin 750 p.o. daily -Prednisone 40 mg p.o. daily-  fast taper due to history of avascular necrosis -Incentive spirometer at bedside -MetaNeb therapy with saline every 6 hours -DuoNebs every 6 hours while awake -Evaluate need for home O2 therapy when optimized for DC -procalcitonin is negative    Spiculated right lung nodule and ipsilateral satellite lesion with PET avid hilar lymphadenopathy -High pretest probability of lung malignancy -Noted oncology input - appreciate recommendations - Dr Rogue Bussing - will be available to assist with tissue diagnosis as deemed appropriate by tumor board     Tobacco abuse  -Nicotine replacement therapy with  transdermal patch -Smoking cessation counseling provided for over 10 minutes today -Patient states he will quit completely immediately       Thank you for allowing me to participate in the care of this patient.   Patient/Family are satisfied with care plan and all questions have been answered.  This document was prepared using Dragon voice recognition software and may include unintentional dictation errors.     Ottie Glazier, M.D.  Division of [place]

## 2018-10-15 NOTE — Progress Notes (Signed)
Pt gave good effort with bedside spirometry. Results were FVC 114.9% of predicted, FEV1=66% of predicted  And FEV1/FVC=57% of predicted.

## 2018-10-15 NOTE — Progress Notes (Signed)
[facility] [place] NAME: Joseph Peters    MR#:  [id number]  DATE OF BIRTH:  06-Jan-1956  SUBJECTIVE:  CHIEF COMPLAINT:  Pts denies any abdominal pain is better at 5 out of 10 Tight in his chest Eventually patient started having severe pain behind the right eye.  CT head is negative  REVIEW OF SYSTEMS:  CONSTITUTIONAL: No fever, fatigue or weakness.  EYES: No blurred or double vision.  EARS, NOSE, AND THROAT: No tinnitus or ear pain.  RESPIRATORY: No cough, shortness of breath, wheezing or hemoptysis.  CARDIOVASCULAR: No chest pain, orthopnea, edema.  GASTROINTESTINAL: No nausea, vomiting, diarrhea or abdominal pain.  GENITOURINARY: No dysuria, hematuria.  ENDOCRINE: No polyuria, nocturia,  HEMATOLOGY: No anemia, easy bruising or bleeding SKIN: No rash or lesion. MUSCULOSKELETAL: No joint pain or arthritis.   NEUROLOGIC: No tingling, numbness, weakness.  PSYCHIATRY: No anxiety or depression.   DRUG ALLERGIES:   Allergies  Allergen Reactions  . Penicillins Swelling, Rash and Other (See Comments)    Did it involve swelling of the face/tongue/throat, SOB, or low BP? yes Did it involve sudden or severe rash/hives, skin peeling, or any reaction on the inside of your mouth or nose? no Did you need to seek medical attention at a hospital or doctor's office? Yes When did it last happen?years  If all above answers are "NO", may proceed with cephalosporin use.    . Lidocaine Other (See Comments)    unsure    VITALS:  Blood pressure (!) 144/84, pulse 96, temperature 97.7 F (36.5 C), temperature source Oral, resp. rate (!) 28, height 5\' 6"  (1.676 m), weight 55.2 kg, SpO2 99 %.  PHYSICAL EXAMINATION:  GENERAL:  63 y.o.-year-old patient lying in the bed with no acute distress.  EYES: Pupils equal, round, reactive to light and accommodation. No scleral icterus. Extraocular muscles intact.  HEENT: Head atraumatic,  normocephalic. Oropharynx and nasopharynx clear.  NECK:  Supple, no jugular venous distention. No thyroid enlargement, no tenderness.  LUNGS: Normal breath sounds bilaterally, no wheezing, rales,rhonchi or crepitation. No use of accessory muscles of respiration.  CARDIOVASCULAR: S1, S2 normal. No murmurs, rubs, or gallops.  ABDOMEN: Soft, nontender, nondistended. Bowel sounds present.  EXTREMITIES: No pedal edema, cyanosis, or clubbing.  NEUROLOGIC: Cranial nerves II through XII are intact. Muscle strength 5/5 in all extremities. Sensation intact. Gait not checked.  PSYCHIATRIC: The patient is alert and oriented x 3.  SKIN: No obvious rash, lesion, or ulcer.    LABORATORY PANEL:   CBC Recent Labs  Lab 10/14/18 0640  WBC 11.9*  HGB 14.9  HCT 44.4  PLT 303   ------------------------------------------------------------------------------------------------------------------  Chemistries  Recent Labs  Lab 10/14/18 0640  NA 134*  K 4.4  CL 101  CO2 24  GLUCOSE 103*  BUN 7*  CREATININE 1.03  CALCIUM 9.1  AST 18  ALT 10  ALKPHOS 94  BILITOT 1.0   ------------------------------------------------------------------------------------------------------------------  Cardiac Enzymes No results for input(s): TROPONINI in the last 168 hours. ------------------------------------------------------------------------------------------------------------------  RADIOLOGY:  Dg Chest 2 View  Result Date: 10/14/2018 CLINICAL DATA:  Chest and abdominal pain. EXAM: CHEST - 2 VIEW COMPARISON:  07/01/2018 FINDINGS: Airspace disease in the right upper lobe in the area of prior nodule seen on CT. Severe underlying COPD/Esposito. Heart is normal size. Left lung clear. No effusions or acute bony abnormality. IMPRESSION: Severe COPD. Peripheral airspace disease in the right upper lobe in the area of prior pulmonary nodule. This could  reflect pneumonia. Followup PA and lateral chest X-ray is recommended  in 3-4 weeks following trial of antibiotic therapy to ensure resolution and exclude underlying malignancy. Electronically Signed   By: Rolm Baptise M.D.   On: 10/14/2018 08:08   Ct Head Wo Contrast  Result Date: 10/15/2018 CLINICAL DATA:  Right eye pain.  Possible facial droop. EXAM: CT HEAD WITHOUT CONTRAST TECHNIQUE: Contiguous axial images were obtained from the base of the skull through the vertex without intravenous contrast. COMPARISON:  None. FINDINGS: Brain: No evidence of acute infarction, hemorrhage, hydrocephalus, extra-axial collection or mass lesion/mass effect. Vascular: No hyperdense vessel or unexpected calcification. Skull: Normal. Negative for fracture or focal lesion. Sinuses/Orbits: No acute finding. Other: None. IMPRESSION: No acute intracranial findings. Electronically Signed   By: Davina Poke M.D.   On: 10/15/2018 15:00   Dg Chest Port 1 View  Result Date: 10/14/2018 CLINICAL DATA:  Right lower quadrant pain and emesis. EXAM: PORTABLE CHEST 1 VIEW COMPARISON:  October 14, 2018 FINDINGS: Cardiomediastinal silhouette is normal. Mediastinal contours appear intact. Advanced upper lobe predominant emphysema. Persistent large area of subpleural airspace consolidation in the right upper lobe, in the site of previously seen pulmonary nodule. Osseous structures are without acute abnormality. Soft tissues are grossly normal. IMPRESSION: 1. Persistent large area of subpleural airspace consolidation in the right upper lobe, at the site of previously seen pulmonary nodule. 2. Advanced upper lobe predominant emphysema. Electronically Signed   By: Fidela Salisbury M.D.   On: 10/14/2018 15:46   Ct Angio Chest/abd/pel For Dissection W And/or Wo Contrast  Result Date: 10/14/2018 CLINICAL DATA:  History of AAA, COPD, HTN, here with severe abd pain. Pt was in usual state of health until last night. He states he ate soup for dinner then felt fine, was going to bed around midnight when he had  multiple episodes of coughing and vomiting. He had no abd pain at the time however. He awoke this morning with ongoing nausea but no additional vomiting, along with severe burning, aching, epigastric and R sided abd pain. The pain then became primarily RLQ. He's had subjective chills but no fevers. No diarrhea. Pain is worse w/ palpation and eating EXAM: CT ANGIOGRAPHY CHEST, ABDOMEN AND PELVIS TECHNIQUE: Multidetector CT imaging through the chest, abdomen and pelvis was performed using the standard protocol during bolus administration of intravenous contrast. Multiplanar reconstructed images and MIPs were obtained and reviewed to evaluate the vascular anatomy. CONTRAST:  64mL OMNIPAQUE IOHEXOL 350 MG/ML SOLN COMPARISON:  08/13/2018 and previous FINDINGS: CTA CHEST FINDINGS Cardiovascular: Heart size normal. No pericardial effusion. RV is nondilated. Fair contrast opacification of pulmonary artery branches; the exam was not optimized for detection of pulmonary emboli. Coronary calcifications. Aortic valve leaflet calcifications. Good contrast opacification of the thoracic aorta. No dissection or stenosis. Maximum transverse dimensions as follows: 3.8 cm sinuses of Valsalva 3.1 cm sino-tubular junction 4.4 cm mid ascending (previously 4.2) 3.4 cm distal ascending/proximal arch 2.8 cm distal arch 2.7 cm proximal descending 3.7 cm distal descending saccular aneurysm vs penetrating atheromatous ulcer (previously 3.4) 2.8 cm supra celiac Mediastinum/Nodes: No mediastinal hematoma. 1.2 cm right hilar lymph node image 51/6, previously 0.9 cm. No mediastinal adenopathy. Lungs/Pleura: No pleural effusion. No pneumothorax. Advanced pulmonary emphysema with new patchy peripheral airspace consolidation in the anterior right upper lobe. This obscures the right upper lobe nodules described previously. 0.6 cm probable intrapulmonary lymph node image 92/7, stable. Left lung clear. Musculoskeletal: Stable mild midthoracic  compression deformity. No fracture or worrisome bone  lesion. Review of the MIP images confirms the above findings. CTA ABDOMEN AND PELVIS FINDINGS VASCULAR Aorta: Moderate calcified atheromatous plaque. No dissection or stenosis. Fusiform infrarenal aneurysm 4.8 x 4.4 cm maximum transverse dimensions, previously 3.7 cm on 12/01/2015 by my measurement. Aneurysm terminates at the bifurcation. Extensive nonocclusive mural thrombus in the aneurysmal segment. No evidence of rupture. Celiac: Short-segment origin stenosis of possible hemodynamic significance, patent distally. 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 No stenosis. Distal branching otherwise unremarkable. Renals: Single left, with ostial plaque resulting in short segment stenosis of at least mild severity. Duplicated right renal arteries. The superior right renal artery is dominant, and occluded from its origin over length of at least 2 cm, reconstituted distally. The inferior right renal artery is aberrant and diminutive, arising from the aneurysmal segment of the aorta at the same level of the IMA, remains patent. IMA: Arises from the aneurysmal segment of the aorta. Patent without evidence of aneurysm, dissection, vasculitis or significant stenosis. Inflow: On the right, Short-segment origin stenosis of the common iliac artery of possible hemodynamic significance. Calcified eccentric plaque extends distally through its length without tandem significant stenosis or aneurysm. Internal iliac is atheromatous, patent. Right external iliac and common femoral arteries patent. On the left, calcified atheromatous plaque through the length of the common and internal iliac arteries without high-grade stenosis or aneurysm. External iliac and common femoral patent. Veins: No obvious venous abnormality within the limitations of this arterial phase study. Review of the MIP images confirms the above findings. NON-VASCULAR Hepatobiliary: No  focal liver abnormality is seen. No gallstones, gallbladder wall thickening, or biliary dilatation. Pancreas: Unremarkable. No pancreatic ductal dilatation or surrounding inflammatory changes. Spleen: Normal in size without focal abnormality. Adrenals/Urinary Tract: Normal adrenals. Progressive right renal parenchymal atrophy with poor enhancement of the upper pole likely related to superior right renal artery proximal occlusion. No hydronephrosis. No solid renal mass. Urinary bladder physiologically distended. Stomach/Bowel: Stomach is nondistended. Small bowel decompressed. Normal appendix. The colon is nondilated, unremarkable. Lymphatic: No abdominal or pelvic adenopathy. Reproductive: Prostate is unremarkable. Other: No ascites. No free air. Musculoskeletal: Asymmetric thickening of the crus of the diaphragm on the right stable since 12/01/2015. Lower lumbar spondylitic change. No fracture or worrisome bone lesion. Right hip arthroplasty without dislocation, hardware incompletely visualized. Review of the MIP images confirms the above findings. IMPRESSION: 1. No acute PE, thoracic aortic dissection, or aneurysm rupture. 2. Slight enlargement of 4.4 cm ascending thoracic aortic aneurysm. Recommend annual imaging followup by CTA or MRA. This recommendation follows 2010 ACCF/AHA/AATS/ACR/ASA/SCA/SCAI/SIR/STS/SVM Guidelines for the Diagnosis and Management of Patients with Thoracic Aortic Disease. Circulation. 2010; 121: C144-Y185. Aortic aneurysm NOS (ICD10-I71.9) 3. 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 4. Enlarging 4.8 cm fusiform infrarenal abdominal aortic aneurysm (previously 3.7 cm in 2017). Recommend followup by abdomen and pelvis CTA in 6 months, and vascular surgery referral/consultation if not already obtained. This recommendation follows ACR consensus guidelines: White Paper of the ACR Incidental Findings Committee II on  Vascular Findings. J Am Coll Radiol 2013; 10:789-794. Aortic aneurysm NOS (ICD10-I71.9) 5. Short-segment origin stenosis of the right common iliac artery of possible hemodynamic significance. Correlate with clinical symptomatology and ABIs. 6. 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 Aortic aneurysm NOS (ICD10-I71.9). Aortic Atherosclerosis (ICD10-I70.0) and Emphysema (ICD10-J43.9). Electronically Signed   By: Lucrezia Europe M.D.   On: 10/14/2018 09:44    EKG:  Orders placed or performed in visit on 10/14/18  . EKG 12-Lead  . EKG 12-Lead  . EKG 12-Lead    ASSESSMENT AND PLAN:     63 y.o. male  with past medical history of advanced COPD, chronic pain syndrome, CAD, IBS, MI, restless leg syndrome, CVA, AAA, anxiety, depression, tobacco abuse, alcoholism, and hypertension presenting with right lower quadrant pain.  1. Right lower quadrant pain - Unclear etiology. CT abdomen shows no obvious cause, labs reassuring -- IVFs - PRN pain meds  2. Right upper lobe pneumonia - Chest xray shows large area of subpleural airspace consolidation in the right upper lobe - Patient allergic to penicillin will start empiric with levofloxacin - Blood cultures no growth so far -procalcitonin less than 0.010  3. Acute COPD Exacerbation secondary to pneumonia? - Patient with hx of Advanced COPD with recent PET scan concerning for leak Izora Gala - Supplemental O2, goal sat 88-92% - Bronchodilators (albuterol/ipratropium) standing and PRN - Corticosteroids: IV Solu-Medrol changed to prednisone 40mg  PO daily x5 days - Sputum culture pending - Levofloxacin as above - Pulmonary consult to Dr. Lanney Gins  4.   Severe right eye pain could be cluster headache CT head is negative patient is already on oxygen Seen by neurology no further recommendations or studies needed Continue neuro neurochecks  5. HLD  + Goal LDL<100 - Simvastatin 40mg  PO  qhs  6. HTN  + Goal BP <130/80 -Continue losartan -Hydralazine PRN  7. History of AAA -CT angios chest/abdomen/pelvis shows enlarging aneurysm 4.8 cm previously 3.7 cm in 2017 and short segment stenosis of the right common iliac artery, progressive right renal artery occlusion -Findings discussed with vascular surgeon Dr. Lucky Cowboy. Will schedule appointment on outpatient basis for follow up. If abdominal pain is getting worse please reconsult to Dr. do  8.Coronary Artery Disease - hx of MI - ASA 81mg  PO daily - HTN, HLD, control as below     Tobacco abuse -Smoking cessation counseling provided -Nicotine patch provided    All the records are reviewed and case discussed with Care Management/Social Workerr. Management plans discussed with the patient, family and they are in agreement.  CODE STATUS:   TOTAL TIME TAKING CARE OF THIS PATIENT: 36   minutes.   POSSIBLE D/C IN 2  DAYS, DEPENDING ON CLINICAL CONDITION.  Note: This dictation was prepared with Dragon dictation along with smaller phrase technology. Any transcriptional errors that result from this process are unintentional.   Nicholes Mango M.D on 10/15/2018 at 4:28 PM  Between 7am to 6pm - Pager - [phone] After 6pm go to [email or web address] - password [email or web address] [facility]  Office  [phone]  CC: Primary care physician; Zeb Comfort, MD

## 2018-10-15 NOTE — Plan of Care (Signed)
  Problem: Education: Goal: Knowledge of General Education information will improve Description: Including pain rating scale, medication(s)/side effects and non-pharmacologic comfort measures Outcome: Progressing   Problem: Pain Managment: Goal: General experience of comfort will improve Outcome: Progressing   

## 2018-10-15 NOTE — Progress Notes (Signed)
mdt

## 2018-10-15 NOTE — Assessment & Plan Note (Addendum)
#  63 year old male patient with multiple co-morbidities-is currently admitted to hospital for abdominal pain/shortness of breath  #Right upper lobe lung nodule up to 1.8 cm in size/PET positive-concerning for malignancy question hilar lymph nodes [July 2020].  However given current tenuous respiratory status/pneumonia-I think it is reasonable to hold off investigation for his malignancy at this time.  Would recommend reevaluation in the next few weeks/when respiratory status is more stable.  We will also discuss at the tumor conference.  Patient agreement.  #Worsening shortness of breath-interstitial lung disease /COPD/right upper lobe pneumonia-antibiotics/breathing treatments.  Improving.  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  #Also discussed with his wife [name]; they are in agreement.

## 2018-10-15 NOTE — Progress Notes (Addendum)
Initial Nutrition Assessment  DOCUMENTATION CODES:   Severe malnutrition in context of chronic illness  INTERVENTION:   Ensure Enlive po TID, each supplement provides 350 kcal and 20 grams of protein  Magic cup TID with meals, each supplement provides 290 kcal and 9 grams of protein  MVI daily   Vitamin C 239m po BID  Liberalize diet   NUTRITION DIAGNOSIS:   Severe Malnutrition related to chronic illness(COPD) as evidenced by moderate muscle depletion, severe fat depletion  GOAL:   Patient will meet greater than or equal to 90% of their needs  MONITOR:   PO intake, Supplement acceptance, Labs, Weight trends, Skin, I & O's  REASON FOR ASSESSMENT:   Consult Assessment of nutrition requirement/status  ASSESSMENT:   63year old male with past medical history of advanced COPD, etoh abuse, chronic pain syndrome, CAD, IBS, MI, restless leg syndrome, CVA, AAA, anxiety, depression, tobacco abuse, alcoholism, new lung mass and hypertension presents with right lower quadrant pain.   Met with pt in room today. Pt reports poor appetite and oral intake for several weeks pta. Pt reports that he just has no desire to eat. Pt has lost 9lbs(7%) over the past month; this is significant. Pt has not yet started drinking any supplements; pt is willing to try Ensure while in hospital. Pt also likes ice cream; pt reports he used to eat a lot of ice cream but now he has no desire even for that. RD will add supplements and vitamins to help pt meet his estimated needs. RD will also liberalize diet as a heart healthy diet is restrictive of protein. Pt ate 95% of his lunch today which included a burger and fries.   Medications reviewed and include: lovenox, nicotine, protonix, prednisone  Labs reviewed: Na 134(L) Wbc- 11.9(H)  NUTRITION - FOCUSED PHYSICAL EXAM:    Most Recent Value  Orbital Region  Mild depletion  Upper Arm Region  Severe depletion  Thoracic and Lumbar Region  Severe depletion   Buccal Region  Moderate depletion  Temple Region  Moderate depletion  Clavicle Bone Region  Moderate depletion  Clavicle and Acromion Bone Region  Moderate depletion  Scapular Bone Region  Moderate depletion  Dorsal Hand  Moderate depletion  Patellar Region  Severe depletion  Anterior Thigh Region  Severe depletion  Posterior Calf Region  Severe depletion  Edema (RD Assessment)  None  Hair  Reviewed  Eyes  Reviewed  Mouth  Reviewed  Skin  Reviewed  Nails  Reviewed     Diet Order:   Diet Order            Diet regular Room service appropriate? Yes; Fluid consistency: Thin  Diet effective now             EDUCATION NEEDS:   Education needs have been addressed  Skin:  Skin Assessment: Reviewed RN Assessment(ecchymosis)  Last BM:  8/18  Height:   Ht Readings from Last 1 Encounters:  10/14/18 5' 6"  (1.676 m)    Weight:   Wt Readings from Last 1 Encounters:  10/14/18 55.2 kg    Ideal Body Weight:  64.5 kg  BMI:  Body mass index is 19.64 kg/m.  Estimated Nutritional Needs:   Kcal:  1700-2000kcal/day  Protein:  85-100g/day  Fluid:  >1.7L/day  CKoleen DistanceMS, RD, LDN Pager #- [phone]#- [phone] Hours Pager: [phone]

## 2018-10-15 NOTE — Progress Notes (Signed)
PT Cancellation Note  Patient Details Name: Joseph Peters MRN: [id number] DOB: 12-27-1955   Cancelled Treatment:    Reason Eval/Treat Not Completed: Medical issues which prohibited therapy(Consult received and chart reviewed.  Per discussion with primary RN, patient with increased respiratory difficulty this date, not appropriate for PT intervention.  Will re-attempt at later time/date as medically appropriate.)   Ammi Hutt H. Owens Shark, PT, DPT, NCS 10/15/18, 11:26 AM [phone]

## 2018-10-16 DIAGNOSIS — G44019 Episodic cluster headache, not intractable: Secondary | ICD-10-CM

## 2018-10-16 LAB — PROCALCITONIN: Procalcitonin: <0.1 ng/mL

## 2018-10-16 MED ORDER — SODIUM CHLORIDE 0.9 % IV SOLN
INTRAVENOUS | Status: DC | PRN
  Administered 2018-10-16: 18:00:00 250 mL via INTRAVENOUS

## 2018-10-16 NOTE — Progress Notes (Signed)
Joseph Peters   DOB:01/16/1956   BW#:[id number]    Subjective: Patient sitting up in the bed having breakfast.  States his breathing is improved; continues to have intermittent cough.  Intermittent headaches.  Objective:  Vitals:   10/16/18 0947 10/16/18 1234  BP: (!) 143/90 136/86  Pulse: 100 90  Resp: 19 19  Temp: 98 F (36.7 C) 98.9 F (37.2 C)  SpO2: 98% 97%     Intake/Output Summary (Last 24 hours) at 10/16/2018 1442 Last data filed at 10/16/2018 1403 Gross per 24 hour  Intake 478 ml  Output -  Net 478 ml    Physical Exam  Constitutional: He is oriented to person, place, and time.  Thin built Caucasian male patient.  Appears cachectic.  On nasal cannula oxygen.  HENT:  Head: Normocephalic and atraumatic.  Mouth/Throat: Oropharynx is clear and moist. No oropharyngeal exudate.  Eyes: Pupils are equal, round, and reactive to light.  Neck: Normal range of motion. Neck supple.  Cardiovascular: Normal rate and regular rhythm.  Pulmonary/Chest: No respiratory distress. He has no wheezes.  Poor air entry bilaterally.  Scattered wheeze.  Abdominal: Soft. Bowel sounds are normal. He exhibits no distension and no mass. There is no abdominal tenderness. There is no rebound and no guarding.  Musculoskeletal: Normal range of motion.        General: No tenderness or edema.  Neurological: He is alert and oriented to person, place, and time.  Skin: Skin is warm.  Psychiatric: Affect normal.     Labs:  Lab Results  Component Value Date   WBC 11.9 (H) 10/14/2018   HGB 14.9 10/14/2018   HCT 44.4 10/14/2018   MCV 91.4 10/14/2018   PLT 303 10/14/2018   NEUTROABS 8.3 (H) 07/01/2018    Lab Results  Component Value Date   NA 134 (L) 10/14/2018   K 4.4 10/14/2018   CL 101 10/14/2018   CO2 24 10/14/2018    Studies:  Ct Head Wo Contrast  Result Date: 10/15/2018 CLINICAL DATA:  Right eye pain.  Possible facial droop. EXAM: CT HEAD WITHOUT CONTRAST TECHNIQUE: Contiguous axial  images were obtained from the base of the skull through the vertex without intravenous contrast. COMPARISON:  None. FINDINGS: Brain: No evidence of acute infarction, hemorrhage, hydrocephalus, extra-axial collection or mass lesion/mass effect. Vascular: No hyperdense vessel or unexpected calcification. Skull: Normal. Negative for fracture or focal lesion. Sinuses/Orbits: No acute finding. Other: None. IMPRESSION: No acute intracranial findings. Electronically Signed   By: Davina Poke M.D.   On: 10/15/2018 15:00   Dg Chest Port 1 View  Result Date: 10/14/2018 CLINICAL DATA:  Right lower quadrant pain and emesis. EXAM: PORTABLE CHEST 1 VIEW COMPARISON:  October 14, 2018 FINDINGS: Cardiomediastinal silhouette is normal. Mediastinal contours appear intact. Advanced upper lobe predominant emphysema. Persistent large area of subpleural airspace consolidation in the right upper lobe, in the site of previously seen pulmonary nodule. Osseous structures are without acute abnormality. Soft tissues are grossly normal. IMPRESSION: 1. Persistent large area of subpleural airspace consolidation in the right upper lobe, at the site of previously seen pulmonary nodule. 2. Advanced upper lobe predominant emphysema. Electronically Signed   By: Fidela Salisbury M.D.   On: 10/14/2018 15:46    Nodule of upper lobe of right lung #63 year old male patient with multiple co-morbidities-is currently admitted to hospital for abdominal pain/shortness of breath  #Right upper lobe lung nodule up to 1.8 cm in size/PET positive-concerning for malignancy question hilar lymph nodes [July 2020].  However given current tenuous respiratory status/pneumonia-I think it is reasonable to hold off investigation for his malignancy at this time.  Would recommend reevaluation in the next few weeks/when respiratory status is more stable.  We will also discuss at the tumor conference.  Patient agreement.  #Worsening shortness of  breath-interstitial lung disease /COPD/right upper lobe pneumonia-antibiotics/breathing treatments.  Improving.  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  #Also discussed with his wife [name]; they are in agreement.  Cammie Sickle, MD 10/16/2018  2:42 PM

## 2018-10-16 NOTE — Progress Notes (Signed)
Pulmonary Medicine          Date: 10/16/2018,   MRN# [id number] Joseph Peters August 03, 1955     AdmissionWeight: 58.5 kg                 CurrentWeight: 55.2 kg      CHIEF COMPLAINT:   Moderate acute COPD exacerbation and recent findings of right lung spiculated nodules with hilar/mediastinal lymphadenopathy suggestive of primary bronchogenic carcinoma   SUBJECTIVE    Patient has significant improvement in breathing.  Lung auscultation has improved bilaterally.  Should be optimized to go home tomorrow.  He was able to walk around the hallway on room air without oxygen desaturation.  Would recommend to finish Levaquin 750 mg for total of 7 days and discharged on 40 mg prednisone taper over 5 days.  Additional follow-up in pulmonary clinic has been set up for optimization of underlying COPD and possible lung malignancy.  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   Depression    Disc disease with myelopathy, 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   IBS (irritable bowel syndrome)    Myocardial infarction ([place]) 2010   Restless leg syndrome    Sciatica    Spinal stenosis of lumbar region    Stroke [facility]) 2015   no residual symptoms     SURGICAL HISTORY   Past Surgical History:  Procedure Laterality Date   CARDIAC CATHETERIZATION  2012   COLONOSCOPY WITH PROPOFOL N/A 11/14/2016   Procedure: COLONOSCOPY WITH  PROPOFOL;  Surgeon: Manya Silvas, MD;  Location: [facility] ENDOSCOPY;  Service: Endoscopy;  Laterality: N/A;   ESOPHAGOGASTRODUODENOSCOPY (EGD) WITH PROPOFOL N/A 11/14/2016   Procedure: ESOPHAGOGASTRODUODENOSCOPY (EGD) WITH PROPOFOL;  Surgeon: Manya Silvas, MD;  Location: [facility] ENDOSCOPY;  Service: Endoscopy;  Laterality: N/A;   KNEE SURGERY     right   SHOULDER ARTHROSCOPY WITH OPEN ROTATOR CUFF REPAIR Right 08/20/2017   Procedure: SHOULDER ARTHROSCOPY WITH OPEN ROTATOR CUFF REPAIR;  Surgeon: Corky Mull, MD;  Location: [facility] ORS;  Service: Orthopedics;  Laterality: Right;   SHOULDER ARTHROSCOPY WITH SUBACROMIAL DECOMPRESSION, ROTATOR CUFF REPAIR AND BICEP TENDON REPAIR Left 05/10/2016   Procedure: SHOULDER ARTHROSCOPY WITH DEBTRIDEMENT, DECOMPRESSION, REPAIR OF MASSIVE ROTATOR CUFF TEAR AND BICEP TENODESIS;  Surgeon: Corky Mull, MD;  Location: [facility] ORS;  Service: Orthopedics;  Laterality: Left;  Massive cuff tear   TOTAL HIP ARTHROPLASTY Right 07/01/2018   Procedure: TOTAL HIP ARTHROPLASTY - RIGHT;  Surgeon: Corky Mull, MD;  Location: [facility] ORS;  Service: Orthopedics;  Laterality: Right;     FAMILY HISTORY   Family History  Problem Relation Age of Onset   Cancer Mother    COPD Father 42       black lung     SOCIAL HISTORY   Social History   Tobacco Use   Smoking status: Current Some Day Smoker  Packs/day: 0.50    Years: 30.00    Pack years: 15.00    Types: Cigarettes   Smokeless tobacco: Former Systems developer    Quit date: 02/26/1998  Substance Use Topics   Alcohol use: Yes    Alcohol/week: 0.0 standard drinks    Comment: beer daily   Drug use: No     MEDICATIONS    Home Medication:    Current Medication:  Current Facility-Administered Medications:    diazepam (VALIUM) tablet 5 mg, 5 mg, Oral, QHS PRN, Lang Snow, NP, 5 mg at 10/15/18 2153   enoxaparin (LOVENOX) injection 40 mg, 40 mg, Subcutaneous, Q24H, Ouma, Bing Neighbors, NP, 40 mg at  10/15/18 2145   feeding supplement (ENSURE ENLIVE) (ENSURE ENLIVE) liquid 237 mL, 237 mL, Oral, TID BM, Gouru, Aruna, MD, 237 mL at 10/16/18 1303   finasteride (PROSCAR) tablet 5 mg, 5 mg, Oral, Daily, Ouma, Bing Neighbors, NP, 5 mg at 10/16/18 0932   FLUoxetine (PROZAC) capsule 20 mg, 20 mg, Oral, Daily, Ouma, Bing Neighbors, NP, 20 mg at 10/16/18 0932   gabapentin (NEURONTIN) capsule 900 mg, 900 mg, Oral, QHS, Ouma, Bing Neighbors, NP, 900 mg at 10/15/18 2143   hydrALAZINE (APRESOLINE) injection 10 mg, 10 mg, Intravenous, Q6H PRN, Seals, Angela H, NP, 10 mg at 10/15/18 0201   ipratropium-albuterol (DUONEB) 0.5-2.5 (3) MG/3ML nebulizer solution 3 mL, 3 mL, Nebulization, Q6H, Ouma, Bing Neighbors, NP, 3 mL at 10/16/18 1303   levofloxacin (LEVAQUIN) IVPB 750 mg, 750 mg, Intravenous, Q24H, Ouma, Bing Neighbors, NP, Last Rate: 100 mL/hr at 10/15/18 1730, 750 mg at 10/15/18 1730   losartan (COZAAR) tablet 25 mg, 25 mg, Oral, Daily, Ouma, Bing Neighbors, NP, 25 mg at 10/16/18 0932   montelukast (SINGULAIR) tablet 10 mg, 10 mg, Oral, QHS, Ouma, Bing Neighbors, NP, 10 mg at 10/15/18 2143   multivitamin with minerals tablet 1 tablet, 1 tablet, Oral, Daily, Gouru, Aruna, MD, 1 tablet at 10/16/18 0931   nicotine (NICODERM CQ - dosed in mg/24 hours) patch 21 mg, 21 mg, Transdermal, Daily, Ouma, Bing Neighbors, NP, 21 mg at 10/15/18 1731   oxyCODONE (Oxy IR/ROXICODONE) immediate release tablet 10 mg, 10 mg, Oral, QID PRN, Lang Snow, NP, 10 mg at 10/15/18 2152   pantoprazole (PROTONIX) EC tablet 40 mg, 40 mg, Oral, Daily, Ouma, Bing Neighbors, NP, 40 mg at 10/16/18 0931   predniSONE (DELTASONE) tablet 40 mg, 40 mg, Oral, Q breakfast, Ouma, Bing Neighbors, NP, 40 mg at 10/16/18 0932   simvastatin (ZOCOR) tablet 40 mg, 40 mg, Oral, QHS, Ouma, Bing Neighbors, NP, 40 mg at 10/15/18 2144   tamsulosin (FLOMAX) capsule 0.4 mg, 0.4 mg, Oral, QPC supper, Lang Snow, NP, 0.4 mg at 10/15/18 1729   tiZANidine (ZANAFLEX) tablet 4 mg, 4 mg, Oral, TID PRN, Lang Snow, NP, 4 mg at 10/16/18 1303   vitamin C (ASCORBIC ACID) tablet 250 mg, 250 mg, Oral, BID, Gouru, Aruna, MD, 250 mg at 10/16/18 0093    ALLERGIES   Penicillins and Lidocaine     REVIEW OF SYSTEMS    Review of Systems:  Gen:  Denies  fever, sweats, chills weigh loss  HEENT: Denies blurred vision, double vision, ear pain, eye pain, hearing loss, nose bleeds, sore throat Cardiac:  No dizziness, chest pain or heaviness, chest tightness,edema Resp:   Denies cough or sputum porduction, shortness of breath,wheezing, hemoptysis,  Gi: Denies swallowing difficulty, stomach pain, nausea or vomiting, diarrhea, constipation, bowel incontinence Gu:  Denies bladder  incontinence, burning urine Ext:   Denies Joint pain, stiffness or swelling Skin: Denies  skin rash, easy bruising or bleeding or hives Endoc:  Denies polyuria, polydipsia , polyphagia or weight change Psych:   Denies depression, insomnia or hallucinations   Other:  All other systems negative   VS: BP 136/86 (BP Location: Right Arm)    Pulse 90    Temp 98.9 F (37.2 C) (Oral)    Resp 19    Ht 5\' 6"  (1.676 m)    Wt 55.2 kg    SpO2 99%    BMI 19.64 kg/m      PHYSICAL EXAM    GENERAL:NAD, no fevers, chills, no weakness no fatigue HEAD: Normocephalic, atraumatic.  EYES: Pupils equal, round, reactive to light. Extraocular muscles intact. No scleral icterus.  MOUTH: Moist mucosal membrane. Dentition intact. No abscess noted.  EAR, NOSE, THROAT: Clear without exudates. No external lesions.  NECK: Supple. No thyromegaly. No nodules. No JVD.  PULMONARY: Decreased breath sounds with mild rhonchorous sounds appreciated at the right lung base CARDIOVASCULAR: S1 and S2. Regular rate and rhythm. No murmurs, rubs, or gallops. No edema. Pedal pulses 2+ bilaterally.  GASTROINTESTINAL: Soft, nontender,  nondistended. No masses. Positive bowel sounds. No hepatosplenomegaly.  MUSCULOSKELETAL: No swelling, clubbing, or edema. Range of motion full in all extremities.  NEUROLOGIC: Cranial nerves II through XII are intact. No gross focal neurological deficits. Sensation intact. Reflexes intact.  SKIN: No ulceration, lesions, rashes, or cyanosis. Skin warm and dry. Turgor intact.  PSYCHIATRIC: Mood, affect within normal limits. The patient is awake, alert and oriented x 3. Insight, judgment intact.       IMAGING    Dg Chest 2 View  Result Date: 10/14/2018 CLINICAL DATA:  Chest and abdominal pain. EXAM: CHEST - 2 VIEW COMPARISON:  07/01/2018 FINDINGS: Airspace disease in the right upper lobe in the area of prior nodule seen on CT. Severe underlying COPD/[place]. Heart is normal size. Left lung clear. No effusions or acute bony abnormality. IMPRESSION: Severe COPD. Peripheral airspace disease in the right upper lobe in the area of prior pulmonary nodule. This could reflect pneumonia. Followup PA and lateral chest X-ray is recommended in 3-4 weeks following trial of antibiotic therapy to ensure resolution and exclude underlying malignancy. Electronically Signed   By: Rolm Baptise M.D.   On: 10/14/2018 08:08   Ct Head Wo Contrast  Result Date: 10/15/2018 CLINICAL DATA:  Right eye pain.  Possible facial droop. EXAM: CT HEAD WITHOUT CONTRAST TECHNIQUE: Contiguous axial images were obtained from the base of the skull through the vertex without intravenous contrast. COMPARISON:  None. FINDINGS: Brain: No evidence of acute infarction, hemorrhage, hydrocephalus, extra-axial collection or mass lesion/mass effect. Vascular: No hyperdense vessel or unexpected calcification. Skull: Normal. Negative for fracture or focal lesion. Sinuses/Orbits: No acute finding. Other: None. IMPRESSION: No acute intracranial findings. Electronically Signed   By: Davina Poke M.D.   On: 10/15/2018 15:00   Dg Chest Port 1  View  Result Date: 10/14/2018 CLINICAL DATA:  Right lower quadrant pain and emesis. EXAM: PORTABLE CHEST 1 VIEW COMPARISON:  October 14, 2018 FINDINGS: Cardiomediastinal silhouette is normal. Mediastinal contours appear intact. Advanced upper lobe predominant emphysema. Persistent large area of subpleural airspace consolidation in the right upper lobe, in the site of previously seen pulmonary nodule. Osseous structures are without acute abnormality. Soft tissues are grossly normal. IMPRESSION: 1. Persistent large area of subpleural airspace consolidation in the right upper lobe, at the site of previously seen  pulmonary nodule. 2. Advanced upper lobe predominant emphysema. Electronically Signed   By: Fidela Salisbury M.D.   On: 10/14/2018 15:46   Ct Angio Chest/abd/pel For Dissection W And/or Wo Contrast  Result Date: 10/14/2018 CLINICAL DATA:  History of AAA, COPD, HTN, here with severe abd pain. Pt was in usual state of health until last night. He states he ate soup for dinner then felt fine, was going to bed around midnight when he had multiple episodes of coughing and vomiting. He had no abd pain at the time however. He awoke this morning with ongoing nausea but no additional vomiting, along with severe burning, aching, epigastric and R sided abd pain. The pain then became primarily RLQ. He's had subjective chills but no fevers. No diarrhea. Pain is worse w/ palpation and eating EXAM: CT ANGIOGRAPHY CHEST, ABDOMEN AND PELVIS TECHNIQUE: Multidetector CT imaging through the chest, abdomen and pelvis was performed using the standard protocol during bolus administration of intravenous contrast. Multiplanar reconstructed images and MIPs were obtained and reviewed to evaluate the vascular anatomy. CONTRAST:  43mL OMNIPAQUE IOHEXOL 350 MG/ML SOLN COMPARISON:  08/13/2018 and previous FINDINGS: CTA CHEST FINDINGS Cardiovascular: Heart size normal. No pericardial effusion. RV is nondilated. Fair contrast  opacification of pulmonary artery branches; the exam was not optimized for detection of pulmonary emboli. Coronary calcifications. Aortic valve leaflet calcifications. Good contrast opacification of the thoracic aorta. No dissection or stenosis. Maximum transverse dimensions as follows: 3.8 cm sinuses of Valsalva 3.1 cm sino-tubular junction 4.4 cm mid ascending (previously 4.2) 3.4 cm distal ascending/proximal arch 2.8 cm distal arch 2.7 cm proximal descending 3.7 cm distal descending saccular aneurysm vs penetrating atheromatous ulcer (previously 3.4) 2.8 cm supra celiac Mediastinum/Nodes: No mediastinal hematoma. 1.2 cm right hilar lymph node image 51/6, previously 0.9 cm. No mediastinal adenopathy. Lungs/Pleura: No pleural effusion. No pneumothorax. Advanced pulmonary emphysema with new patchy peripheral airspace consolidation in the anterior right upper lobe. This obscures the right upper lobe nodules described previously. 0.6 cm probable intrapulmonary lymph node image 92/7, stable. Left lung clear. Musculoskeletal: Stable mild midthoracic compression deformity. No fracture or worrisome bone lesion. Review of the MIP images confirms the above findings. CTA ABDOMEN AND PELVIS FINDINGS VASCULAR Aorta: Moderate calcified atheromatous plaque. No dissection or stenosis. Fusiform infrarenal aneurysm 4.8 x 4.4 cm maximum transverse dimensions, previously 3.7 cm on 12/01/2015 by my measurement. Aneurysm terminates at the bifurcation. Extensive nonocclusive mural thrombus in the aneurysmal segment. No evidence of rupture. Celiac: Short-segment origin stenosis of possible hemodynamic significance, patent distally. 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 No stenosis. Distal branching otherwise unremarkable. Renals: Single left, with ostial plaque resulting in short segment stenosis of at least mild severity. Duplicated right renal arteries. The superior right renal artery is dominant, and  occluded from its origin over length of at least 2 cm, reconstituted distally. The inferior right renal artery is aberrant and diminutive, arising from the aneurysmal segment of the aorta at the same level of the IMA, remains patent. IMA: Arises from the aneurysmal segment of the aorta. Patent without evidence of aneurysm, dissection, vasculitis or significant stenosis. Inflow: On the right, Short-segment origin stenosis of the common iliac artery of possible hemodynamic significance. Calcified eccentric plaque extends distally through its length without tandem significant stenosis or aneurysm. Internal iliac is atheromatous, patent. Right external iliac and common femoral arteries patent. On the left, calcified atheromatous plaque through the length of the common and internal iliac arteries without high-grade stenosis or  aneurysm. External iliac and common femoral patent. Veins: No obvious venous abnormality within the limitations of this arterial phase study. Review of the MIP images confirms the above findings. NON-VASCULAR Hepatobiliary: No focal liver abnormality is seen. No gallstones, gallbladder wall thickening, or biliary dilatation. Pancreas: Unremarkable. No pancreatic ductal dilatation or surrounding inflammatory changes. Spleen: Normal in size without focal abnormality. Adrenals/Urinary Tract: Normal adrenals. Progressive right renal parenchymal atrophy with poor enhancement of the upper pole likely related to superior right renal artery proximal occlusion. No hydronephrosis. No solid renal mass. Urinary bladder physiologically distended. Stomach/Bowel: Stomach is nondistended. Small bowel decompressed. Normal appendix. The colon is nondilated, unremarkable. Lymphatic: No abdominal or pelvic adenopathy. Reproductive: Prostate is unremarkable. Other: No ascites. No free air. Musculoskeletal: Asymmetric thickening of the crus of the diaphragm on the right stable since 12/01/2015. Lower lumbar spondylitic  change. No fracture or worrisome bone lesion. Right hip arthroplasty without dislocation, hardware incompletely visualized. Review of the MIP images confirms the above findings. IMPRESSION: 1. No acute PE, thoracic aortic dissection, or aneurysm rupture. 2. Slight enlargement of 4.4 cm ascending thoracic aortic aneurysm. Recommend annual imaging followup by CTA or MRA. This recommendation follows 2010 ACCF/AHA/AATS/ACR/ASA/SCA/SCAI/SIR/STS/SVM Guidelines for the Diagnosis and Management of Patients with Thoracic Aortic Disease. Circulation. 2010; 121: J009-F818. Aortic aneurysm NOS (ICD10-I71.9) 3. 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 4. Enlarging 4.8 cm fusiform infrarenal abdominal aortic aneurysm (previously 3.7 cm in 2017). Recommend followup by abdomen and pelvis CTA in 6 months, and vascular surgery referral/consultation if not already obtained. This recommendation follows ACR consensus guidelines: White Paper of the ACR Incidental Findings Committee II on Vascular Findings. J Am Coll Radiol 2013; 10:789-794. Aortic aneurysm NOS (ICD10-I71.9) 5. Short-segment origin stenosis of the right common iliac artery of possible hemodynamic significance. Correlate with clinical symptomatology and ABIs. 6. 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 Aortic aneurysm NOS (ICD10-I71.9). Aortic Atherosclerosis (ICD10-I70.0) and Emphysema (ICD10-J43.9). Electronically Signed   By: Lucrezia Europe M.D.   On: 10/14/2018 09:44       PET CT 09/01/18 IMPRESSION: 1. Hypermetabolic (max SUV 29.9) irregular solid 1.8 cm peripheral right upper lobe pulmonary nodule, compatible with primary bronchogenic carcinoma, increased in size from recent chest CT. 2. Hypermetabolic more central right upper lobe 0.8 cm solid pulmonary nodule, also increased in size, suspicious for pulmonary metastasis to  the same lobe. 3. Hypermetabolic right hilar lymph node suspicious for metastatic disease. No hypermetabolic mediastinal adenopathy. 4. No hypermetabolic distant metastatic disease. 5. Infrarenal 4.5 cm Abdominal Aortic Aneurysm (ICD10-I71.9). Recommend follow-up CTA of the abdomen and pelvis in 6 months and vascular surgery referral/consultation. This recommendation follows ACR consensus guidelines: White Paper of the ACR Incidental Findings Committee II on Vascular Findings. J Am Coll Radiol 2013; 10:789-794. 6. Aortic Atherosclerosis (ICD10-I70.0) and Emphysema (ICD10-J43.9).  pe  ASSESSMENT/PLAN   Moderate acute exacerbation of COPD -COPD care path -Levaquin 750 p.o. daily -Prednisone 40 mg p.o. daily-  fast taper due to history of avascular necrosis -Incentive spirometer at bedside -MetaNeb therapy with saline every 6 hours -DuoNebs every 6 hours while awake -Evaluate need for home O2 therapy when optimized for DC -procalcitonin is negative    Spiculated right lung nodule and ipsilateral satellite lesion with PET avid hilar lymphadenopathy -High pretest probability of lung malignancy -Noted oncology input - appreciate recommendations - Dr Rogue Bussing - will be available to assist with tissue diagnosis as deemed appropriate by  tumor board     Tobacco abuse  -Nicotine replacement therapy with transdermal patch -Smoking cessation counseling provided for over 10 minutes today -Patient states he will quit completely immediately       Thank you for allowing me to participate in the care of this patient.   Patient/Family are satisfied with care plan and all questions have been answered.  This document was prepared using Dragon voice recognition software and may include unintentional dictation errors.     Ottie Glazier, M.D.  Division of [facility]

## 2018-10-16 NOTE — Evaluation (Signed)
Physical Therapy Evaluation Patient Details Name: Joseph Peters MRN: [id number] DOB: March 24, 1955 Today's Date: 10/16/2018   History of Present Illness  63 year old male with past medical history of advanced COPD, chronic pain syndrome, CAD, IBS, MI, restless leg syndrome, CVA, AAA, anxiety, depression, and hypertension presenting to the ED on 10/14/18 with right lower quadrant pain. CT angio chest abdomen and pelvis showed no acute PE, slightly enlarging 4.8 cm abdominal aortic aneurysm noted with progressive right renal artery occlusion.  Chest x-ray showed right upper lobe consolidation also noted on CTA. Pt admitted for mgt of enlarging aneurysm and persistent elevated blood pressure with signs of COPD exacerbations.  Clinical Impression  Pt generally did well with PT, showed functional strength, no LOBs or over safety issues with mobility/ambulation (150 ft) and was able to negotiate steps w/o issue.  His O2 was high 90s on arrival on 2L, wanted to walk w/o O2 and he did have some subjective fatigue along with change in vitals with O2 dropping to 88% and HR to the high 130s.  Pt does not need further PT intervention, however needs to stay active ([facility]?) and could need O2.  Follow Up Recommendations No PT follow up(could benefit from [facility]-type program)    Equipment Recommendations  None recommended by PT    Recommendations for Other Services       Precautions / Restrictions Precautions Precautions: Fall Restrictions Weight Bearing Restrictions: No      Mobility  Bed Mobility Overal bed mobility: Independent             General bed mobility comments: Pt able to don socks, get to EOB w/o assist  Transfers Overall transfer level: Independent Equipment used: None             General transfer comment: Able to rise without direct assist, showed good safety awareness in standing  Ambulation/Gait Ambulation/Gait assistance: Modified independent (Device/Increase  time) Gait Distance (Feet): 150 Feet Assistive device: None       General Gait Details: Pt able to ambulate with consistent speed and good confidence.  He wanted to attempt ambulation w/o O2, sats dropped slowly to 88% and HR increased to high 130s.  He reports some subjective fatigue, but reports feeling okay with the effort.    Stairs Stairs: Yes Stairs assistance: Supervision Stair Management: One rail Left Number of Stairs: 6 General stair comments: Pt was able to negotiate up/down steps w/o assist and no safety concerns.  Wheelchair Mobility    Modified Rankin (Stroke Patients Only)       Balance Overall balance assessment: History of Falls;Modified Independent                                           Pertinent Vitals/Pain Pain Assessment: Faces Faces Pain Scale: Hurts a little bit Pain Location: minimal R eye pain during PT session, more c/o abdominal pain    Home Living Family/patient expects to be discharged to:: Private residence Living Arrangements: Spouse/significant other Available Help at Discharge: Family Type of Home: House Home Access: Stairs to enter Entrance Stairs-Rails: Can reach both;Left;Right Entrance Stairs-Number of Steps: 3 at front 5 at back Home Layout: One level Home Equipment: Grab bars - toilet;Walker - 2 wheels;Cane - single point;Shower seat - built in;Grab bars - tub/shower;Walker - 4 wheels      Prior Function Level of Independence: Independent  Comments: Per chart pt has had multiple falls in past six months including one immediately prior to his prior R THA in May. On this date he states he has had no falls in past 6 months.     Hand Dominance        Extremity/Trunk Assessment   Upper Extremity Assessment Upper Extremity Assessment: Overall WFL for tasks assessed    Lower Extremity Assessment Lower Extremity Assessment: Overall WFL for tasks assessed       Communication   Communication:  No difficulties  Cognition Arousal/Alertness: Awake/alert Behavior During Therapy: WFL for tasks assessed/performed Overall Cognitive Status: Impaired/Different from baseline                                        General Comments      Exercises     Assessment/Plan    PT Assessment Patent does not need any further PT services  PT Problem List Decreased activity tolerance       PT Treatment Interventions      PT Goals (Current goals can be found in the Care Plan section)  Acute Rehab PT Goals Patient Stated Goal: To go home PT Goal Formulation: With patient Time For Goal Achievement: 10/30/18 Potential to Achieve Goals: Good    Frequency     Barriers to discharge        Co-evaluation               AM-PAC PT "6 Clicks" Mobility  Outcome Measure Help needed turning from your back to your side while in a flat bed without using bedrails?: None Help needed moving from lying on your back to sitting on the side of a flat bed without using bedrails?: None Help needed moving to and from a bed to a chair (including a wheelchair)?: None Help needed standing up from a chair using your arms (e.g., wheelchair or bedside chair)?: None Help needed to walk in hospital room?: None Help needed climbing 3-5 steps with a railing? : None 6 Click Score: 24    End of Session Equipment Utilized During Treatment: Gait belt Activity Tolerance: Patient tolerated treatment well;Patient limited by fatigue Patient left: with call bell/phone within reach;with bed alarm set Nurse Communication: Mobility status(changes in vitals on room air (O2 to 88%, HR to 130s)) PT Visit Diagnosis: Difficulty in walking, not elsewhere classified (R26.2)    Time: 5053-9767 PT Time Calculation (min) (ACUTE ONLY): 27 min   Charges:   PT Evaluation $PT Eval Low Complexity: 1 Low PT Treatments $Gait Training: 8-22 mins        Kreg Shropshire, DPT 10/16/2018, 11:04 AM

## 2018-10-16 NOTE — Progress Notes (Signed)
[facility] [place] NAME: Joseph Peters    MR#:  [id number]  DATE OF BIRTH:  08-06-1955  SUBJECTIVE:  CHIEF COMPLAINT:   Chief Complaint  Patient presents with  . Abdominal Pain    REVIEW OF SYSTEMS:  ROS CONSTITUTIONAL: No fever, fatigue or weakness.  EYES: Right eye pain.  No blurred or double vision.  EARS, NOSE, AND THROAT: No tinnitus or ear pain.  RESPIRATORY: No cough, shortness of breath, wheezing or hemoptysis.  CARDIOVASCULAR: No chest pain, orthopnea, edema.  GASTROINTESTINAL: No nausea, vomiting, diarrhea or abdominal pain.  GENITOURINARY: No dysuria, hematuria.  ENDOCRINE: No polyuria, nocturia,  HEMATOLOGY: No anemia, easy bruising or bleeding SKIN: No rash or lesion. MUSCULOSKELETAL: No joint pain or arthritis.   NEUROLOGIC: No tingling, numbness, weakness.  PSYCHIATRY: No anxiety or depression.  DRUG ALLERGIES:   Allergies  Allergen Reactions  . Penicillins Swelling, Rash and Other (See Comments)    Did it involve swelling of the face/tongue/throat, SOB, or low BP? yes Did it involve sudden or severe rash/hives, skin peeling, or any reaction on the inside of your mouth or nose? no Did you need to seek medical attention at a hospital or doctor's office? Yes When did it last happen?years  If all above answers are "NO", may proceed with cephalosporin use.    . Lidocaine Other (See Comments)    unsure   VITALS:  Blood pressure 136/86, pulse 90, temperature 98.9 F (37.2 C), temperature source Oral, resp. rate 19, height 5\' 6"  (1.676 m), weight 55.2 kg, SpO2 97 %. PHYSICAL EXAMINATION:  Physical Exam  GENERAL:  63 y.o.-year-old patient lying in the bed with no acute distress.  EYES: Pupils equal, round, reactive to light and accommodation. No scleral icterus. Extraocular muscles intact.  HEENT: Head atraumatic, normocephalic. Oropharynx and nasopharynx clear.  NECK:  Supple, no jugular venous  distention. No thyroid enlargement, no tenderness.  LUNGS: Normal breath sounds bilaterally, no wheezing, rales,rhonchi or crepitation. No use of accessory muscles of respiration.  CARDIOVASCULAR: S1, S2 normal. No murmurs, rubs, or gallops.  ABDOMEN: Soft, nontender, nondistended. Bowel sounds present.  EXTREMITIES: No pedal edema, cyanosis, or clubbing.  NEUROLOGIC: Cranial nerves II through XII are intact. Muscle strength 5/5 in all extremities. Sensation intact. Gait not checked.  PSYCHIATRIC: The patient is alert and oriented x 3.  SKIN: No obvious rash, lesion, or ulcer.   LABORATORY PANEL:  Male CBC Recent Labs  Lab 10/14/18 0640  WBC 11.9*  HGB 14.9  HCT 44.4  PLT 303   ------------------------------------------------------------------------------------------------------------------ Chemistries  Recent Labs  Lab 10/14/18 0640  NA 134*  K 4.4  CL 101  CO2 24  GLUCOSE 103*  BUN 7*  CREATININE 1.03  CALCIUM 9.1  AST 18  ALT 10  ALKPHOS 94  BILITOT 1.0   RADIOLOGY:  No results found. ASSESSMENT AND PLAN:    63 y.o.malewith past medical history of advanced COPD, chronic pain syndrome, CAD, IBS, MI, restless leg syndrome, CVA, AAA, anxiety, depression, tobacco abuse, alcoholism, and hypertension presenting with right lower quadrant pain.  1.Right lower quadrant pain-Unclear etiology. CT abdomen shows no obvious cause, labs reassuring --Appears resolved clinically -- IVFs - PRN pain meds  2. Rightupperlobe pneumonia - Chest xray showslarge area of subpleural airspace consolidation in the right upper lobe -Patient allergic to penicillin.  Patient being treated empirically with levofloxacin -Blood cultures no growth so far -procalcitonin less than 0.010  3.Acute COPD Exacerbation secondary to  pneumonia?- Patient with hx of Advanced COPD with recent PET scan concerning forleak Nancy - Supplemental O2, goal sat 88-92% - Bronchodilators  (albuterol/ipratropium) standing and PRN - Corticosteroids: IV Solu-Medrol changed to prednisone40mg  PO daily x5 days - Sputum culturepending - Levofloxacinas above - Pulmonaryconsult to Dr. Lanney Gins; appreciate input  4.  Severe right eye pain could be cluster headache CT head is negative patient is already on oxygen Requested for ophthalmology consult today due to persistence of the right eye pain Reviewed notes from neurologist later today.  Felt to be likely due to cluster headaches that resolved with oxygen.  No further imaging studies recommended  5.HLD  + Goal LDL<100 - Simvastatin 40mg  PO qhs  6.HTN  + Goal BP <130/80 -Continue losartan -Hydralazine PRN  7.History of AAA-CT angios chest/abdomen/pelvis shows enlarging aneurysm 4.8 cm previously 3.7 cm in 2017 and short segment stenosis of the right common iliac artery, progressive right renal artery occlusion -Findings discussed with vascular surgeon Dr. Lucky Cowboy. Will schedule appointment on outpatient basis for follow up. If abdominal pain is getting worse please reconsult to Dr. Lucky Cowboy  8.Coronary Artery Disease- hx of MI - ASA 81mg  PO daily - HTN, HLD, control as below  9.Irregular solid 1.8 cm peripheral right upper lobe pulmonary nodule, compatible with primary bronchogenic carcinoma, increased in size from recent chest CT. Hypermetabolic more central right upper lobe 0.8 cm solid pulmonary nodule, also increased in size, suspicious for pulmonary metastasis to the same lobe. Seen by oncologist.  Recommendation was to hold off on investigation for malignancy at this time given current treatment for pneumonia.  To reevaluate in the next few weeks once respiratory status more stable.  Follow-up with oncologist post discharge.  10. Tobacco abuse -Smoking cessation counseling provided -Nicotine patch provided  DVT prophylaxis; Lovenox   All the records are reviewed and case discussed with Care  Management/Social Worker. Management plans discussed with the patient, family and they are in agreement.  CODE STATUS: Full Code  TOTAL TIME TAKING CARE OF THIS PATIENT: 37 minutes.   More than 50% of the time was spent in counseling/coordination of care: YES  POSSIBLE D/C IN 2 DAYS, DEPENDING ON CLINICAL CONDITION.   Kyrianna Barletta M.D on 10/16/2018 at 3:20 PM  Between 7am to 6pm - Pager - [phone]  After 6pm go to [email or web address] - Proofreader  [facility]  Office  [phone]  CC: Primary care physician; Zeb Comfort, MD  Note: This dictation was prepared with Dragon dictation along with smaller phrase technology. Any transcriptional errors that result from this process are unintentional.

## 2018-10-16 NOTE — Plan of Care (Signed)
The patient ambulated with the nurse this afternoon and his oxygen level was >93% as he ambulated 3 laps around the unit. Oxygen 94% on room air. The patient has still complained of pain on the back of the right eye. No falls. Ophthalmology was consulted.  Problem: Education: Goal: Knowledge of General Education information will improve Description: Including pain rating scale, medication(s)/side effects and non-pharmacologic comfort measures Outcome: Progressing   Problem: Health Behavior/Discharge Planning: Goal: Ability to manage health-related needs will improve Outcome: Progressing   Problem: Clinical Measurements: Goal: Ability to maintain clinical measurements within normal limits will improve Outcome: Progressing Goal: Will remain free from infection Outcome: Progressing Goal: Diagnostic test results will improve Outcome: Progressing Goal: Respiratory complications will improve Outcome: Progressing Goal: Cardiovascular complication will be avoided Outcome: Progressing   Problem: Activity: Goal: Risk for activity intolerance will decrease Outcome: Progressing   Problem: Nutrition: Goal: Adequate nutrition will be maintained Outcome: Progressing   Problem: Coping: Goal: Level of anxiety will decrease Outcome: Progressing   Problem: Elimination: Goal: Will not experience complications related to bowel motility Outcome: Progressing Goal: Will not experience complications related to urinary retention Outcome: Progressing   Problem: Pain Managment: Goal: General experience of comfort will improve Outcome: Progressing   Problem: Safety: Goal: Ability to remain free from injury will improve Outcome: Progressing   Problem: Skin Integrity: Goal: Risk for impaired skin integrity will decrease Outcome: Progressing

## 2018-10-16 NOTE — Progress Notes (Signed)
Subjective: resolution of R eye pain   Past Medical History:  Diagnosis Date  . AAA (abdominal aortic aneurysm) without rupture ([place])   . Abdominal aneurysm ([place]) 2015   being followed but not large enough to surgically treat  . Anxiety   . Arthritis   . Asthma   . Benign hypertension 07/16/2014  . Chest pain on exertion 07/16/2014  . Chronic pain syndrome   . Coccydynia 02/08/2012  . COPD (chronic obstructive pulmonary disease) ([place])    patient smokes  . Coronary artery disease   . 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 Depression   . 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 IBS (irritable bowel syndrome)   . Myocardial infarction ([place]) 2010  . Restless leg syndrome   . Sciatica   . Spinal stenosis of lumbar region   . Stroke [facility]) 2015   no residual symptoms    Past Surgical History:  Procedure Laterality Date  . CARDIAC CATHETERIZATION  2012  . COLONOSCOPY WITH PROPOFOL N/A 11/14/2016   Procedure: COLONOSCOPY WITH PROPOFOL;  Surgeon: Manya Silvas, MD;  Location: [facility] ENDOSCOPY;  Service: Endoscopy;  Laterality: N/A;  . ESOPHAGOGASTRODUODENOSCOPY (EGD) WITH PROPOFOL N/A 11/14/2016   Procedure: ESOPHAGOGASTRODUODENOSCOPY (EGD) WITH PROPOFOL;  Surgeon: Manya Silvas, MD;  Location: [facility] ENDOSCOPY;  Service: Endoscopy;  Laterality: N/A;  . KNEE SURGERY     right  . SHOULDER ARTHROSCOPY WITH OPEN ROTATOR CUFF REPAIR Right 08/20/2017   Procedure: SHOULDER ARTHROSCOPY WITH OPEN ROTATOR CUFF REPAIR;  Surgeon: Corky Mull, MD;  Location: [facility] ORS;  Service: Orthopedics;  Laterality: Right;  . SHOULDER ARTHROSCOPY WITH SUBACROMIAL DECOMPRESSION, ROTATOR CUFF REPAIR AND BICEP TENDON REPAIR Left 05/10/2016   Procedure: SHOULDER ARTHROSCOPY WITH DEBTRIDEMENT, DECOMPRESSION, REPAIR OF MASSIVE ROTATOR CUFF TEAR AND BICEP TENODESIS;  Surgeon: Corky Mull, MD;  Location: [facility] ORS;  Service: Orthopedics;  Laterality: Left;  Massive cuff tear  . TOTAL  HIP ARTHROPLASTY Right 07/01/2018   Procedure: TOTAL HIP ARTHROPLASTY - RIGHT;  Surgeon: Corky Mull, MD;  Location: [facility] ORS;  Service: Orthopedics;  Laterality: Right;    Family History  Problem Relation Age of Onset  . Cancer Mother   . COPD Father 65       black lung    Social History:  reports that he has been smoking cigarettes. He has a 15.00 pack-year smoking history. He quit smokeless tobacco use about 20 years ago. He reports current alcohol use. He reports that he does not use drugs.  Allergies  Allergen Reactions  . Penicillins Swelling, Rash and Other (See Comments)    Did it involve swelling of the face/tongue/throat, SOB, or low BP? yes Did it involve sudden or severe rash/hives, skin peeling, or any reaction on the inside of your mouth or nose? no Did you need to seek medical attention at a hospital or doctor's office? Yes When did it last happen?years  If all above answers are "NO", may proceed with cephalosporin use.    . Lidocaine Other (See Comments)    unsure    Medications: I have reviewed the patient's current medications.  ROS: Pt is very anxious and unable to provide full ROS history  Physical Examination: Blood pressure (!) 143/90, pulse 100, temperature 98 F (36.7 C), temperature source Axillary, resp. rate 19, height 5\' 6"  (1.676 m), weight 55.2 kg, SpO2 98 %.  HEENT-  Normocephalic, no lesions, without obvious abnormality.  Normal external eye and conjunctiva.  Normal TM's bilaterally.  Normal auditory canals and external ears. Normal external nose, mucus membranes and septum.  Normal pharynx. Cardiovascular- regular rate and rhythm, S1, S2 normal, no murmur, click, rub or gallop, pulses palpable throughout   Lungs- Heart exam - S1, S2 normal, no murmur, no gallop, rate regular Abdomen- soft, non-tender; bowel sounds normal; no masses,  no organomegaly Extremities- no skin discoloration Lymph-no adenopathy palpable Musculoskeletal-no joint  tenderness, deformity or swelling, osteoarthritic changes noted in both hands Skin-dry  Neurological Examination   Mental Status: Alert, oriented, thought content appropriate.  Speech fluent without evidence of aphasia.  Able to follow 3 step commands without difficulty. Cranial Nerves: II: Discs flat bilaterally; Visual fields grossly normal, pupils equal, round, reactive to light and accommodation III,IV, VI: ptosis not present, extra-ocular motions intact bilaterally V,VII: smile symmetric, facial light touch sensation normal bilaterally VIII: hearing normal bilaterally IX,X: gag reflex present XI: bilateral shoulder shrug XII: midline tongue extension Motor: Right : Upper extremity   5/5    Left:     Upper extremity   5/5  Lower extremity   5/5     Lower extremity   5/5 Tone and bulk:normal tone throughout; no atrophy noted Sensory: Pinprick and light touch intact throughout, bilaterally Deep Tendon Reflexes: 1+ and symmetric throughout Plantars: Right: downgoing   Left: downgoing Cerebellar: Not tested  Gait: not tested       Laboratory Studies:   Basic Metabolic Panel: Recent Labs  Lab 10/14/18 0640  NA 134*  K 4.4  CL 101  CO2 24  GLUCOSE 103*  BUN 7*  CREATININE 1.03  CALCIUM 9.1    Liver Function Tests: Recent Labs  Lab 10/14/18 0640  AST 18  ALT 10  ALKPHOS 94  BILITOT 1.0  PROT 7.3  ALBUMIN 3.3*   Recent Labs  Lab 10/14/18 0640  LIPASE 23   No results for input(s): AMMONIA in the last 168 hours.  CBC: Recent Labs  Lab 10/14/18 0640  WBC 11.9*  HGB 14.9  HCT 44.4  MCV 91.4  PLT 303    Cardiac Enzymes: No results for input(s): CKTOTAL, CKMB, CKMBINDEX, TROPONINI in the last 168 hours.  BNP: Invalid input(s): POCBNP  CBG: No results for input(s): GLUCAP in the last 168 hours.  Microbiology: Results for orders placed or performed during the hospital encounter of 10/14/18  SARS CORONAVIRUS 2 Nasal Swab Aptima Multi Swab      Status: None   Collection Time: 10/14/18 11:12 AM   Specimen: Aptima Multi Swab; Nasal Swab  Result Value Ref Range Status   SARS Coronavirus 2 NEGATIVE NEGATIVE Final    Comment: (NOTE) SARS-CoV-2 target nucleic acids are NOT DETECTED. The SARS-CoV-2 RNA is generally detectable in upper and lower respiratory specimens during the acute phase of infection. Negative results do not preclude SARS-CoV-2 infection, do not rule out co-infections with other pathogens, and should not be used as the sole basis for treatment or other patient management decisions. Negative results must be combined with clinical observations, patient history, and epidemiological information. The expected result is Negative. Fact Sheet for Patients: SugarRoll.be Fact Sheet for Healthcare Providers: https://www.woods-mathews.com/ This test is not yet approved or cleared by the [place] FDA and  has been authorized for detection and/or diagnosis of SARS-CoV-2 by FDA under an Emergency Use Authorization (EUA). This EUA will remain  in effect (meaning this test can be used) for the duration of the COVID-19 declaration under Section 56 4(b)(1) of the Act, 21  U.S.C. section 360bbb-3(b)(1), unless the authorization is terminated or revoked sooner. Performed at [facility], [place] [address] [place], [place] [address]   SARS Coronavirus 2 [facility] order, Performed in [facility] hospital lab) Nasopharyngeal Nasopharyngeal Swab     Status: None   Collection Time: 10/14/18  3:20 PM   Specimen: Nasopharyngeal Swab  Result Value Ref Range Status   SARS Coronavirus 2 NEGATIVE NEGATIVE Final    Comment: (NOTE) If result is NEGATIVE SARS-CoV-2 target nucleic acids are NOT DETECTED. The SARS-CoV-2 RNA is generally detectable in upper and lower  respiratory specimens during the acute phase of infection. The lowest  concentration of SARS-CoV-2 viral copies this assay can  detect is 250  copies / mL. A negative result does not preclude SARS-CoV-2 infection  and should not be used as the sole basis for treatment or other  patient management decisions.  A negative result may occur with  improper specimen collection / handling, submission of specimen other  than nasopharyngeal swab, presence of viral mutation(s) within the  areas targeted by this assay, and inadequate number of viral copies  (<250 copies / mL). A negative result must be combined with clinical  observations, patient history, and epidemiological information. If result is POSITIVE SARS-CoV-2 target nucleic acids are DETECTED. The SARS-CoV-2 RNA is generally detectable in upper and lower  respiratory specimens dur ing the acute phase of infection.  Positive  results are indicative of active infection with SARS-CoV-2.  Clinical  correlation with patient history and other diagnostic information is  necessary to determine patient infection status.  Positive results do  not rule out bacterial infection or co-infection with other viruses. If result is PRESUMPTIVE POSTIVE SARS-CoV-2 nucleic acids MAY BE PRESENT.   A presumptive positive result was obtained on the submitted specimen  and confirmed on repeat testing.  While 2019 novel coronavirus  (SARS-CoV-2) nucleic acids may be present in the submitted sample  additional confirmatory testing may be necessary for epidemiological  and / or clinical management purposes  to differentiate between  SARS-CoV-2 and other Sarbecovirus currently known to infect humans.  If clinically indicated additional testing with an alternate test  methodology [phone]) is advised. The SARS-CoV-2 RNA is generally  detectable in upper and lower respiratory sp ecimens during the acute  phase of infection. The expected result is Negative. Fact Sheet for Patients:  StrictlyIdeas.no Fact Sheet for Healthcare  Providers: BankingDealers.co.za This test is not yet approved or cleared by the [place] FDA and has been authorized for detection and/or diagnosis of SARS-CoV-2 by FDA under an Emergency Use Authorization (EUA).  This EUA will remain in effect (meaning this test can be used) for the duration of the COVID-19 declaration under Section 564(b)(1) of the Act, 21 U.S.C. section 360bbb-3(b)(1), unless the authorization is terminated or revoked sooner. Performed at [facility], [address] [place], [place] [address]   Culture, blood (routine x 2) Call MD if unable to obtain prior to antibiotics being given     Status: None (Preliminary result)   Collection Time: 10/14/18  3:27 PM   Specimen: BLOOD  Result Value Ref Range Status   Specimen Description BLOOD RIGHT ANTECUBITAL  Final   Special Requests   Final    BOTTLES DRAWN AEROBIC AND ANAEROBIC Blood Culture results may not be optimal due to an excessive volume of blood received in culture bottles   Culture   Final    NO GROWTH 2 DAYS Performed at [facility], [address], [place], [place] [address]    Report Status PENDING  Incomplete  Culture, blood (routine x 2) Call MD if unable to obtain prior to antibiotics being given     Status: None (Preliminary result)   Collection Time: 10/14/18  3:32 PM   Specimen: BLOOD  Result Value Ref Range Status   Specimen Description BLOOD LEFT ANTECUBITAL  Final   Special Requests   Final    BOTTLES DRAWN AEROBIC AND ANAEROBIC Blood Culture results may not be optimal due to an excessive volume of blood received in culture bottles   Culture   Final    NO GROWTH 2 DAYS Performed at [facility], [address] [place], [place] [address]    Report Status PENDING  Incomplete    Coagulation Studies: No results for input(s): LABPROT, INR in the last 72 hours.  Urinalysis:  Recent Labs  Lab 10/14/18 1112  COLORURINE STRAW*  LABSPEC  1.036*  PHURINE 6.0  GLUCOSEU NEGATIVE  HGBUR NEGATIVE  BILIRUBINUR NEGATIVE  KETONESUR 5*  PROTEINUR NEGATIVE  NITRITE NEGATIVE  LEUKOCYTESUR NEGATIVE    Lipid Panel:  No results found for: CHOL, TRIG, HDL, CHOLHDL, VLDL, LDLCALC  HgbA1C: No results found for: HGBA1C  Urine Drug Screen:  No results found for: LABOPIA, COCAINSCRNUR, LABBENZ, AMPHETMU, THCU, LABBARB  Alcohol Level: No results for input(s): ETH in the last 168 hours.  Other results: EKG: normal EKG, normal sinus rhythm, unchanged from previous tracings.  Imaging: Ct Head Wo Contrast  Result Date: 10/15/2018 CLINICAL DATA:  Right eye pain.  Possible facial droop. EXAM: CT HEAD WITHOUT CONTRAST TECHNIQUE: Contiguous axial images were obtained from the base of the skull through the vertex without intravenous contrast. COMPARISON:  None. FINDINGS: Brain: No evidence of acute infarction, hemorrhage, hydrocephalus, extra-axial collection or mass lesion/mass effect. Vascular: No hyperdense vessel or unexpected calcification. Skull: Normal. Negative for fracture or focal lesion. Sinuses/Orbits: No acute finding. Other: None. IMPRESSION: No acute intracranial findings. Electronically Signed   By: Davina Poke M.D.   On: 10/15/2018 15:00   Dg Chest Port 1 View  Result Date: 10/14/2018 CLINICAL DATA:  Right lower quadrant pain and emesis. EXAM: PORTABLE CHEST 1 VIEW COMPARISON:  October 14, 2018 FINDINGS: Cardiomediastinal silhouette is normal. Mediastinal contours appear intact. Advanced upper lobe predominant emphysema. Persistent large area of subpleural airspace consolidation in the right upper lobe, in the site of previously seen pulmonary nodule. Osseous structures are without acute abnormality. Soft tissues are grossly normal. IMPRESSION: 1. Persistent large area of subpleural airspace consolidation in the right upper lobe, at the site of previously seen pulmonary nodule. 2. Advanced upper lobe predominant emphysema.  Electronically Signed   By: Fidela Salisbury M.D.   On: 10/14/2018 15:46     Assessment/Plan:  63 y.o. male with hx of advanced COPD, chronic pain syndrome, CAD, IBS, MI, restless leg syndrome, CVA, AAA, anxiety, depression, tobacco abuse, alcoholism, and hypertension presenting yesterday  with right lower quadrant pain.  Denies fevers or chills, diarrhea, or chest pain. Patient however endorses chronic productive cough with shortness of breath.  He is a daily smoker and smokes about a half a pack of cigarettes x 40 years. Admitted with COPD exacerbation Neurology consulted due to brief episode of R frontal eye pain and possibly facial droop that self resolved in minutes.     - No further pain behind right eye - CTH no acute abnormalities - likely cluster headache that resolves with oxygen which patient  is on  - no further imaging form neuro perspective.

## 2018-10-16 NOTE — Progress Notes (Signed)
SATURATION QUALIFICATIONS: (This note is used to comply with regulatory documentation for home oxygen)  Patient Saturations on Room Air at Rest = 95%  Patient Saturations on Room Air while Ambulating = 94%  Patient Saturations on 2 Liters of oxygen while Ambulating = 98%  Please briefly explain why patient needs home oxygen:

## 2018-10-17 ENCOUNTER — Telehealth (INDEPENDENT_AMBULATORY_CARE_PROVIDER_SITE_OTHER): Payer: Self-pay

## 2018-10-17 LAB — BASIC METABOLIC PANEL WITH GFR
Anion gap: 5 (ref 5–15)
BUN: 17 mg/dL (ref 8–23)
CO2: 28 mmol/L (ref 22–32)
Calcium: 8.6 mg/dL — ABNORMAL LOW (ref 8.9–10.3)
Chloride: 102 mmol/L (ref 98–111)
Creatinine, Ser: 0.91 mg/dL (ref 0.61–1.24)
GFR calc Af Amer: >60 mL/min
GFR calc non Af Amer: >60 mL/min
Glucose, Bld: 95 mg/dL (ref 70–99)
Potassium: 3.8 mmol/L (ref 3.5–5.1)
Sodium: 135 mmol/L (ref 135–145)

## 2018-10-17 LAB — MAGNESIUM: Magnesium: 2.1 mg/dL (ref 1.7–2.4)

## 2018-10-17 MED ORDER — LEVOFLOXACIN 750 MG PO TABS: 750.0000 mg | ORAL_TABLET | Freq: Every day | ORAL | 0 refills | Status: AC

## 2018-10-17 MED ORDER — IPRATROPIUM-ALBUTEROL 0.5-2.5 (3) MG/3ML IN SOLN: 3.0000 mL | Freq: Two times a day (BID) | RESPIRATORY_TRACT | Status: DC

## 2018-10-17 MED ORDER — AMLODIPINE BESYLATE 5 MG PO TABS: 5.0000 mg | ORAL_TABLET | Freq: Every day | ORAL | Status: DC

## 2018-10-17 MED ORDER — DALIRESP 250 MCG PO TABS: 250.0000 ug | ORAL_TABLET | Freq: Every day | ORAL | 0 refills | Status: AC

## 2018-10-17 MED ORDER — PREDNISONE 10 MG PO TABS: ORAL_TABLET | ORAL | 0 refills | Status: DC

## 2018-10-17 MED ORDER — ADULT MULTIVITAMIN W/MINERALS CH: 1.0000 | ORAL_TABLET | Freq: Every day | ORAL | 0 refills | Status: DC

## 2018-10-17 MED ORDER — ENSURE ENLIVE PO LIQD: 237.0000 mL | Freq: Three times a day (TID) | ORAL | 0 refills | Status: AC

## 2018-10-17 MED ORDER — NICOTINE 21 MG/24HR TD PT24: 21.0000 mg | MEDICATED_PATCH | Freq: Every day | TRANSDERMAL | 0 refills | Status: DC

## 2018-10-17 MED ORDER — IPRATROPIUM-ALBUTEROL 0.5-2.5 (3) MG/3ML IN SOLN
3.0000 mL | Freq: Three times a day (TID) | RESPIRATORY_TRACT | Status: DC
  Administered 2018-10-17: 3 mL via RESPIRATORY_TRACT
  Filled 2018-10-17: qty 3

## 2018-10-17 MED ORDER — METOPROLOL TARTRATE 25 MG PO TABS: 25.0000 mg | ORAL_TABLET | Freq: Two times a day (BID) | ORAL | 0 refills | Status: DC

## 2018-10-17 MED ORDER — METOPROLOL TARTRATE 25 MG PO TABS
25.0000 mg | ORAL_TABLET | Freq: Two times a day (BID) | ORAL | Status: DC
  Administered 2018-10-17: 25 mg via ORAL
  Filled 2018-10-17: qty 1

## 2018-10-17 NOTE — Progress Notes (Signed)
Occupational Therapy Treatment Patient Details Name: Joseph Peters MRN: [id number] DOB: 12/06/1955 Today's Date: 10/17/2018    History of present illness 63 year old male with past medical history of advanced COPD, chronic pain syndrome, CAD, IBS, MI, restless leg syndrome, CVA, AAA, anxiety, depression, and hypertension presenting to the ED on 10/14/18 with right lower quadrant pain. CT angio chest abdomen and pelvis showed no acute PE, slightly enlarging 4.8 cm abdominal aortic aneurysm noted with progressive right renal artery occlusion.  Chest x-ray showed right upper lobe consolidation also noted on CTA. Pt admitted for mgt of enlarging aneurysm and persistent elevated blood pressure with signs of COPD exacerbations.   OT comments  Pt seen for OT tx this date after nursing cleared OT to work with pt. Pt seated in bed, fully dressed, and eager to discharge this afternoon. Pt instructed in ECS with handout provided this date to support recall and carryover into daily routines at home. Pt verbalized understanding. VSS during session and pt reports no SOB this date. Pt progressing towards OT goals. Discharge plans updated to reflect progress. Will continue to progress while hospitalized.    Follow Up Recommendations  No OT follow up    Equipment Recommendations  None recommended by OT    Recommendations for Other Services      Precautions / Restrictions Precautions Precautions: Fall Restrictions Weight Bearing Restrictions: No       Mobility Bed Mobility Overal bed mobility: Independent                Transfers Overall transfer level: Independent Equipment used: None                  Balance Overall balance assessment: History of Falls;Modified Independent                                         ADL either performed or assessed with clinical judgement   ADL                                         General ADL Comments: Pt  continues to demonstrate impairments with activity tolerance, requiring pt to take additional time and effort to perform, but able to perform with less assistance, O2 sats remained WNL. Cues for ECS     Vision Baseline Vision/History: Wears glasses Wears Glasses: At all times Patient Visual Report: No change from baseline     Perception     Praxis      Cognition Arousal/Alertness: Awake/alert Behavior During Therapy: WFL for tasks assessed/performed Overall Cognitive Status: Within Functional Limits for tasks assessed                                          Exercises Other Exercises Other Exercises: Pt instructed in ECS with handout provided this date to support recall and carryover into daily routines at home. Pt verbalized understanding.   Shoulder Instructions       General Comments      Pertinent Vitals/ Pain       Pain Assessment: No/denies pain  Home Living  Prior Functioning/Environment              Frequency  Min 1X/week        Progress Toward Goals  OT Goals(current goals can now be found in the care plan section)  Progress towards OT goals: Progressing toward goals  Acute Rehab OT Goals Patient Stated Goal: To go home OT Goal Formulation: With patient Time For Goal Achievement: 10/29/18 Potential to Achieve Goals: Good  Plan Discharge plan needs to be updated;Frequency needs to be updated    Co-evaluation                 AM-PAC OT "6 Clicks" Daily Activity     Outcome Measure   Help from another person eating meals?: None Help from another person taking care of personal grooming?: None Help from another person toileting, which includes using toliet, bedpan, or urinal?: A Little Help from another person bathing (including washing, rinsing, drying)?: A Little Help from another person to put on and taking off regular upper body clothing?: None Help from  another person to put on and taking off regular lower body clothing?: A Little 6 Click Score: 21    End of Session    OT Visit Diagnosis: Muscle weakness (generalized) (M62.81);History of falling (Z91.81);Other abnormalities of gait and mobility (R26.89)   Activity Tolerance Patient tolerated treatment well   Patient Left in bed;with call bell/phone within reach   Nurse Communication          Time: 8185-6314 OT Time Calculation (min): 14 min  Charges: OT General Charges $OT Visit: 1 Visit OT Treatments $Self Care/Home Management : 8-22 mins  Jeni Salles, MPH, MS, OTR/L ascom [phone] 10/17/18, 2:31 PM

## 2018-10-17 NOTE — Telephone Encounter (Signed)
Joseph Huxley, MD  [name], [name]        He will need a stent graft AAA repair, maybe 2-3 weeks out and get cardiac clearance in the interim. To see [place] within a week.      Patient is scheduled for a AAA repair on 11/05/2018. Pre-op and Covid testing has not been scheduled as of yet.

## 2018-10-17 NOTE — Care Management Important Message (Signed)
Important Message  Patient Details  Name: Joseph Peters MRN: [id number] Date of Birth: 20-Dec-1955   Medicare Important Message Given:  Yes     Dannette Barbara 10/17/2018, 10:58 AM

## 2018-10-17 NOTE — Consult Note (Signed)
Cardiology Consultation Note    Patient ID: Joseph Peters, MRN: [id number], DOB/AGE: [birth year] 63 y.o. Admit date: 10/14/2018   Date of Consult: 10/17/2018 Primary Physician: Zeb Comfort, MD Primary Cardiologist: Dr. Ubaldo Glassing  Chief Complaint: abdominal pain Reason for Consultation: preop Requesting MD: Kaleen Mask  HPI: Joseph Peters is a 63 y.o. male with history of advanced COPD, chronic pain syndrome, CAD, IBS, MI, restless leg syndrome, CVA, AAA, anxiety, depression, tobacco abuse, alcoholism, and hypertension who was admitted with abdominal pain.  AAA has been followed by vascular surgery.  Diameter of the abdominal aortic aneurysm is increased to 4.8 x 4.4 cm maximum and was previously 3.7 cm.  Disease is infrarenal and terminates at the bifurcation.  Was not ruptured.  Patient was also seen by neurology due to headache.  Head CT was unremarkable.  Chest CT suggested possible lung malignancy versus exacerbation of COPD.  Patient is ready to go home from a pulmonary standpoint with Levaquin and prednisone taper.  He denies chest pain.  EKG revealed sinus tachycardia with no ischemia.  Echocardiogram done in January of this year revealed near normal LV function at 50% with no high-grade valvular disease.  He has no chest pain but is limited in his activity due to shortness of breath.  Past Medical History:  Diagnosis Date  . AAA (abdominal aortic aneurysm) without rupture ([place])   . Abdominal aneurysm ([place]) 2015   being followed but not large enough to surgically treat  . Anxiety   . Arthritis   . Asthma   . Benign hypertension 07/16/2014  . Chest pain on exertion 07/16/2014  . Chronic pain syndrome   . Coccydynia 02/08/2012  . COPD (chronic obstructive pulmonary disease) ([place])    patient smokes  . Coronary artery disease   . 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 Depression   . 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 IBS (irritable  bowel syndrome)   . Myocardial infarction ([place]) 2010  . Restless leg syndrome   . Sciatica   . Spinal stenosis of lumbar region   . Stroke [facility]) 2015   no residual symptoms      Surgical History:  Past Surgical History:  Procedure Laterality Date  . CARDIAC CATHETERIZATION  2012  . COLONOSCOPY WITH PROPOFOL N/A 11/14/2016   Procedure: COLONOSCOPY WITH PROPOFOL;  Surgeon: Manya Silvas, MD;  Location: [facility] ENDOSCOPY;  Service: Endoscopy;  Laterality: N/A;  . ESOPHAGOGASTRODUODENOSCOPY (EGD) WITH PROPOFOL N/A 11/14/2016   Procedure: ESOPHAGOGASTRODUODENOSCOPY (EGD) WITH PROPOFOL;  Surgeon: Manya Silvas, MD;  Location: [facility] ENDOSCOPY;  Service: Endoscopy;  Laterality: N/A;  . KNEE SURGERY     right  . SHOULDER ARTHROSCOPY WITH OPEN ROTATOR CUFF REPAIR Right 08/20/2017   Procedure: SHOULDER ARTHROSCOPY WITH OPEN ROTATOR CUFF REPAIR;  Surgeon: Corky Mull, MD;  Location: [facility] ORS;  Service: Orthopedics;  Laterality: Right;  . SHOULDER ARTHROSCOPY WITH SUBACROMIAL DECOMPRESSION, ROTATOR CUFF REPAIR AND BICEP TENDON REPAIR Left 05/10/2016   Procedure: SHOULDER ARTHROSCOPY WITH DEBTRIDEMENT, DECOMPRESSION, REPAIR OF MASSIVE ROTATOR CUFF TEAR AND BICEP TENODESIS;  Surgeon: Corky Mull, MD;  Location: [facility] ORS;  Service: Orthopedics;  Laterality: Left;  Massive cuff tear  . TOTAL HIP ARTHROPLASTY Right 07/01/2018   Procedure: TOTAL HIP ARTHROPLASTY - RIGHT;  Surgeon: Corky Mull, MD;  Location: [facility] ORS;  Service: Orthopedics;  Laterality: Right;     Home Meds: Prior to Admission medications   Medication  Sig Start Date End Date Taking? Authorizing Provider  albuterol (PROVENTIL HFA;VENTOLIN HFA) 108 (90 Base) MCG/ACT inhaler Inhale 2 puffs into the lungs every 4 (four) hours as needed for wheezing or shortness of breath.    Yes [provider]  aspirin EC 81 MG tablet Take 81 mg by mouth at bedtime.    Yes [provider]  DALIRESP 250 MCG TABS Take 250 mg by mouth at  bedtime.  06/10/18  Yes [provider]  diazepam (VALIUM) 5 MG tablet Take 5 mg by mouth at bedtime as needed (restless legs).  11/15/16  Yes [provider]  finasteride (PROSCAR) 5 MG tablet Take 1 tablet (5 mg total) by mouth daily. 03/06/18  Yes Festus Aloe, MD  FLUoxetine (PROZAC) 20 MG capsule Take 20 mg by mouth daily.    Yes [provider]  Fluticasone-Umeclidin-Vilant (TRELEGY ELLIPTA) 100-62.5-25 MCG/INH AEPB Inhale 2 puffs into the lungs at bedtime.  08/18/18  Yes [provider]  gabapentin (NEURONTIN) 300 MG capsule Take 900 mg by mouth at bedtime.    Yes [provider]  losartan (COZAAR) 25 MG tablet Take 25 mg by mouth daily.    Yes [provider]  montelukast (SINGULAIR) 10 MG tablet Take 10 mg by mouth at bedtime.   Yes [provider]  omeprazole (PRILOSEC) 20 MG capsule Take 20 mg by mouth daily.   Yes [provider]  Oxycodone HCl 10 MG TABS Take 10 mg by mouth 4 (four) times daily as needed for pain. 09/29/18  Yes [provider]  simvastatin (ZOCOR) 40 MG tablet Take 40 mg by mouth at bedtime.    Yes [provider]  tamsulosin (FLOMAX) 0.4 MG CAPS capsule Take 1 capsule (0.4 mg total) by mouth daily after supper. 06/24/18  Yes Festus Aloe, MD  tiZANidine (ZANAFLEX) 4 MG tablet Take 4 mg by mouth 3 (three) times daily as needed for muscle spasms. 10/10/18  Yes [provider]    Inpatient Medications:  . enoxaparin (LOVENOX) injection  40 mg Subcutaneous Q24H  . feeding supplement (ENSURE ENLIVE)  237 mL Oral TID BM  . finasteride  5 mg Oral Daily  . FLUoxetine  20 mg Oral Daily  . gabapentin  900 mg Oral QHS  . ipratropium-albuterol  3 mL Nebulization TID  . losartan  25 mg Oral Daily  . metoprolol tartrate  25 mg Oral BID  . montelukast  10 mg Oral QHS  . multivitamin with minerals  1 tablet Oral Daily  . nicotine  21 mg Transdermal Daily  . pantoprazole  40 mg  Oral Daily  . predniSONE  40 mg Oral Q breakfast  . simvastatin  40 mg Oral QHS  . tamsulosin  0.4 mg Oral QPC supper  . vitamin C  250 mg Oral BID   . sodium chloride Stopped (10/16/18 1804)  . levofloxacin (LEVAQUIN) IV Stopped (10/16/18 1824)    Allergies:  Allergies  Allergen Reactions  . Penicillins Swelling, Rash and Other (See Comments)    Did it involve swelling of the face/tongue/throat, SOB, or low BP? yes Did it involve sudden or severe rash/hives, skin peeling, or any reaction on the inside of your mouth or nose? no Did you need to seek medical attention at a hospital or doctor's office? Yes When did it last happen?years  If all above answers are "NO", may proceed with cephalosporin use.    . Lidocaine Other (See Comments)    unsure  Social History   Socioeconomic History  . Marital status: Married    Spouse name: Not on file  . Number of children: Not on file  . Years of education: Not on file  . Highest education level: Not on file  Occupational History  . Not on file  Social Needs  . Financial resource strain: Not on file  . Food insecurity    Worry: Not on file    Inability: Not on file  . Transportation needs    Medical: Not on file    Non-medical: Not on file  Tobacco Use  . Smoking status: Current Some Day Smoker    Packs/day: 0.50    Years: 30.00    Pack years: 15.00    Types: Cigarettes  . Smokeless tobacco: Former Systems developer    Quit date: 02/26/1998  Substance and Sexual Activity  . Alcohol use: Yes    Alcohol/week: 0.0 standard drinks    Comment: beer daily  . Drug use: No  . Sexual activity: Not on file  Lifestyle  . Physical activity    Days per week: Not on file    Minutes per session: Not on file  . Stress: Not on file  Relationships  . Social Herbalist on phone: Not on file    Gets together: Not on file    Attends religious service: Not on file    Active member of club or organization: Not on file    Attends  meetings of clubs or organizations: Not on file    Relationship status: Not on file  . Intimate partner violence    Fear of current or ex partner: Not on file    Emotionally abused: Not on file    Physically abused: Not on file    Forced sexual activity: Not on file  Other Topics Concern  . Not on file  Social History Narrative   Patient is a Nature conservation officer.;  He lives with his wife at home.  Longstanding history of smoking; quit recently.  Denies alcohol abuse.     Family History  Problem Relation Age of Onset  . Cancer Mother   . COPD Father 60       black lung     Review of Systems: A 12-system review of systems was performed and is negative except as noted in the HPI.  Labs: No results for input(s): CKTOTAL, CKMB, TROPONINI in the last 72 hours. Lab Results  Component Value Date   WBC 11.9 (H) 10/14/2018   HGB 14.9 10/14/2018   HCT 44.4 10/14/2018   MCV 91.4 10/14/2018   PLT 303 10/14/2018    Recent Labs  Lab 10/14/18 0640 10/17/18 0550  NA 134* 135  K 4.4 3.8  CL 101 102  CO2 24 28  BUN 7* 17  CREATININE 1.03 0.91  CALCIUM 9.1 8.6*  PROT 7.3  --   BILITOT 1.0  --   ALKPHOS 94  --   ALT 10  --   AST 18  --   GLUCOSE 103* 95   No results found for: CHOL, HDL, LDLCALC, TRIG No results found for: DDIMER  Radiology/Studies:  Dg Chest 2 View  Result Date: 10/14/2018 CLINICAL DATA:  Chest and abdominal pain. EXAM: CHEST - 2 VIEW COMPARISON:  07/01/2018 FINDINGS: Airspace disease in the right upper lobe in the area of prior nodule seen on CT. Severe underlying COPD/[place]. Heart is normal size. Left lung clear. No effusions or acute bony abnormality. IMPRESSION:  Severe COPD. Peripheral airspace disease in the right upper lobe in the area of prior pulmonary nodule. This could reflect pneumonia. Followup PA and lateral chest X-ray is recommended in 3-4 weeks following trial of antibiotic therapy to ensure resolution and exclude underlying malignancy.  Electronically Signed   By: Rolm Baptise M.D.   On: 10/14/2018 08:08   Ct Head Wo Contrast  Result Date: 10/15/2018 CLINICAL DATA:  Right eye pain.  Possible facial droop. EXAM: CT HEAD WITHOUT CONTRAST TECHNIQUE: Contiguous axial images were obtained from the base of the skull through the vertex without intravenous contrast. COMPARISON:  None. FINDINGS: Brain: No evidence of acute infarction, hemorrhage, hydrocephalus, extra-axial collection or mass lesion/mass effect. Vascular: No hyperdense vessel or unexpected calcification. Skull: Normal. Negative for fracture or focal lesion. Sinuses/Orbits: No acute finding. Other: None. IMPRESSION: No acute intracranial findings. Electronically Signed   By: Davina Poke M.D.   On: 10/15/2018 15:00   Dg Chest Port 1 View  Result Date: 10/14/2018 CLINICAL DATA:  Right lower quadrant pain and emesis. EXAM: PORTABLE CHEST 1 VIEW COMPARISON:  October 14, 2018 FINDINGS: Cardiomediastinal silhouette is normal. Mediastinal contours appear intact. Advanced upper lobe predominant emphysema. Persistent large area of subpleural airspace consolidation in the right upper lobe, in the site of previously seen pulmonary nodule. Osseous structures are without acute abnormality. Soft tissues are grossly normal. IMPRESSION: 1. Persistent large area of subpleural airspace consolidation in the right upper lobe, at the site of previously seen pulmonary nodule. 2. Advanced upper lobe predominant emphysema. Electronically Signed   By: Fidela Salisbury M.D.   On: 10/14/2018 15:46   Ct Angio Chest/abd/pel For Dissection W And/or Wo Contrast  Result Date: 10/14/2018 CLINICAL DATA:  History of AAA, COPD, HTN, here with severe abd pain. Pt was in usual state of health until last night. He states he ate soup for dinner then felt fine, was going to bed around midnight when he had multiple episodes of coughing and vomiting. He had no abd pain at the time however. He awoke this morning with  ongoing nausea but no additional vomiting, along with severe burning, aching, epigastric and R sided abd pain. The pain then became primarily RLQ. He's had subjective chills but no fevers. No diarrhea. Pain is worse w/ palpation and eating EXAM: CT ANGIOGRAPHY CHEST, ABDOMEN AND PELVIS TECHNIQUE: Multidetector CT imaging through the chest, abdomen and pelvis was performed using the standard protocol during bolus administration of intravenous contrast. Multiplanar reconstructed images and MIPs were obtained and reviewed to evaluate the vascular anatomy. CONTRAST:  68mL OMNIPAQUE IOHEXOL 350 MG/ML SOLN COMPARISON:  08/13/2018 and previous FINDINGS: CTA CHEST FINDINGS Cardiovascular: Heart size normal. No pericardial effusion. RV is nondilated. Fair contrast opacification of pulmonary artery branches; the exam was not optimized for detection of pulmonary emboli. Coronary calcifications. Aortic valve leaflet calcifications. Good contrast opacification of the thoracic aorta. No dissection or stenosis. Maximum transverse dimensions as follows: 3.8 cm sinuses of Valsalva 3.1 cm sino-tubular junction 4.4 cm mid ascending (previously 4.2) 3.4 cm distal ascending/proximal arch 2.8 cm distal arch 2.7 cm proximal descending 3.7 cm distal descending saccular aneurysm vs penetrating atheromatous ulcer (previously 3.4) 2.8 cm supra celiac Mediastinum/Nodes: No mediastinal hematoma. 1.2 cm right hilar lymph node image 51/6, previously 0.9 cm. No mediastinal adenopathy. Lungs/Pleura: No pleural effusion. No pneumothorax. Advanced pulmonary emphysema with new patchy peripheral airspace consolidation in the anterior right upper lobe. This obscures the right upper lobe nodules described previously. 0.6 cm probable intrapulmonary lymph  node image 92/7, stable. Left lung clear. Musculoskeletal: Stable mild midthoracic compression deformity. No fracture or worrisome bone lesion. Review of the MIP images confirms the above findings. CTA  ABDOMEN AND PELVIS FINDINGS VASCULAR Aorta: Moderate calcified atheromatous plaque. No dissection or stenosis. Fusiform infrarenal aneurysm 4.8 x 4.4 cm maximum transverse dimensions, previously 3.7 cm on 12/01/2015 by my measurement. Aneurysm terminates at the bifurcation. Extensive nonocclusive mural thrombus in the aneurysmal segment. No evidence of rupture. Celiac: Short-segment origin stenosis of possible hemodynamic significance, patent distally. 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 No stenosis. Distal branching otherwise unremarkable. Renals: Single left, with ostial plaque resulting in short segment stenosis of at least mild severity. Duplicated right renal arteries. The superior right renal artery is dominant, and occluded from its origin over length of at least 2 cm, reconstituted distally. The inferior right renal artery is aberrant and diminutive, arising from the aneurysmal segment of the aorta at the same level of the IMA, remains patent. IMA: Arises from the aneurysmal segment of the aorta. Patent without evidence of aneurysm, dissection, vasculitis or significant stenosis. Inflow: On the right, Short-segment origin stenosis of the common iliac artery of possible hemodynamic significance. Calcified eccentric plaque extends distally through its length without tandem significant stenosis or aneurysm. Internal iliac is atheromatous, patent. Right external iliac and common femoral arteries patent. On the left, calcified atheromatous plaque through the length of the common and internal iliac arteries without high-grade stenosis or aneurysm. External iliac and common femoral patent. Veins: No obvious venous abnormality within the limitations of this arterial phase study. Review of the MIP images confirms the above findings. NON-VASCULAR Hepatobiliary: No focal liver abnormality is seen. No gallstones, gallbladder wall thickening, or biliary dilatation. Pancreas:  Unremarkable. No pancreatic ductal dilatation or surrounding inflammatory changes. Spleen: Normal in size without focal abnormality. Adrenals/Urinary Tract: Normal adrenals. Progressive right renal parenchymal atrophy with poor enhancement of the upper pole likely related to superior right renal artery proximal occlusion. No hydronephrosis. No solid renal mass. Urinary bladder physiologically distended. Stomach/Bowel: Stomach is nondistended. Small bowel decompressed. Normal appendix. The colon is nondilated, unremarkable. Lymphatic: No abdominal or pelvic adenopathy. Reproductive: Prostate is unremarkable. Other: No ascites. No free air. Musculoskeletal: Asymmetric thickening of the crus of the diaphragm on the right stable since 12/01/2015. Lower lumbar spondylitic change. No fracture or worrisome bone lesion. Right hip arthroplasty without dislocation, hardware incompletely visualized. Review of the MIP images confirms the above findings. IMPRESSION: 1. No acute PE, thoracic aortic dissection, or aneurysm rupture. 2. Slight enlargement of 4.4 cm ascending thoracic aortic aneurysm. Recommend annual imaging followup by CTA or MRA. This recommendation follows 2010 ACCF/AHA/AATS/ACR/ASA/SCA/SCAI/SIR/STS/SVM Guidelines for the Diagnosis and Management of Patients with Thoracic Aortic Disease. Circulation. 2010; 121: V956-L875. Aortic aneurysm NOS (ICD10-I71.9) 3. 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 4. Enlarging 4.8 cm fusiform infrarenal abdominal aortic aneurysm (previously 3.7 cm in 2017). Recommend followup by abdomen and pelvis CTA in 6 months, and vascular surgery referral/consultation if not already obtained. This recommendation follows ACR consensus guidelines: White Paper of the ACR Incidental Findings Committee II on Vascular Findings. J Am Coll Radiol 2013; 10:789-794. Aortic aneurysm NOS (ICD10-I71.9) 5. Short-segment origin  stenosis of the right common iliac artery of possible hemodynamic significance. Correlate with clinical symptomatology and ABIs. 6. 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 Aortic aneurysm NOS (ICD10-I71.9). Aortic Atherosclerosis (ICD10-I70.0) and Emphysema (ICD10-J43.9).  Electronically Signed   By: Lucrezia Europe M.D.   On: 10/14/2018 09:44    Wt Readings from Last 3 Encounters:  10/14/18 55.2 kg  07/01/18 63 kg  03/05/18 60.1 kg    EKG: Sinus tachycardia/sinus rhythm no ischemia  Physical Exam:  Blood pressure (!) 167/98, pulse 78, temperature 98 F (36.7 C), temperature source Oral, resp. rate 20, height 5\' 6"  (1.676 m), weight 55.2 kg, SpO2 98 %. Body mass index is 19.64 kg/m. General: Well developed, well nourished, in no acute distress. Head: Normocephalic, atraumatic, sclera non-icteric, no xanthomas, nares are without discharge.  Neck: Negative for carotid bruits. JVD not elevated. Lungs: Clear bilaterally to auscultation without wheezes, rales, or rhonchi. Breathing is unlabored. Heart: RRR with S1 S2. No murmurs, rubs, or gallops appreciated. Abdomen: Soft, non-tender, non-distended with normoactive bowel sounds. No hepatomegaly. No rebound/guarding. No obvious abdominal masses. Msk:  Strength and tone appear normal for age. Extremities: No clubbing or cyanosis. No edema.  Distal pedal pulses are 2+ and equal bilaterally. Neuro: Alert and oriented X 3. No facial asymmetry. No focal deficit. Moves all extremities spontaneously. Psych:  Responds to questions appropriately with a normal affect.     Assessment and Plan  64 year old male with history of coronary artery disease, substance abuse, tobacco abuse, COPD with an abdominal aortic aneurysm.  Was admitted and is ruled out for myocardial infarction.  Has had an abdominal aortic aneurysm as well as a thoracic aortic aneurysm in the past.  Currently is hemodynamically  stable and is contemplating undergoing AAA repair due to an enlarging infrarenal aneurysm.  Will need wrist ratification prior to this.  Okay to go home from a cardiac standpoint on current medications.  We will schedule an outpatient functional study early next week to guide further risk stratification.  Chest CT revealed a thoracic aortic aneurysm with maximum diameter 4.4 cm previously 4.2.  No evidence of dissection.  Further recommendations after functional study.  Again patient is okay for discharge will set up a functional study as an outpatient.  Signed, Teodoro Spray MD 10/17/2018, 9:57 AM Pager: [phone]

## 2018-10-17 NOTE — TOC Initial Note (Signed)
Transition of Care [facility]) - Initial/Assessment Note    Patient Details  Name: Joseph Peters MRN: [id number] Date of Birth: 05/18/1955  [facility] CM/SW Contact:    Beverly Sessions, RN Phone Number: 10/17/2018, 4:59 PM  Clinical Narrative:                 Patient discharged prior to my assessment.  Patient has been weaned to RA.  PT has assessed patient and does not recommend any PT follow up.  RNCM followed up with MD to confirm there was not an indication for home health services         Patient Goals and CMS Choice        Expected Discharge Plan and Services           Expected Discharge Date: 10/17/18                                    Prior Living Arrangements/Services                       Activities of Daily Living Home Assistive Devices/Equipment: None ADL Screening (condition at time of admission) Patient's cognitive ability adequate to safely complete daily activities?: Yes Is the patient deaf or have difficulty hearing?: No Does the patient have difficulty seeing, even when wearing glasses/contacts?: No Does the patient have difficulty concentrating, remembering, or making decisions?: Yes Patient able to express need for assistance with ADLs?: Yes Does the patient have difficulty dressing or bathing?: No Independently performs ADLs?: Yes (appropriate for developmental age) Does the patient have difficulty walking or climbing stairs?: No Weakness of Legs: None Weakness of Arms/Hands: None  Permission Sought/Granted                  Emotional Assessment              Admission diagnosis:  SOB (shortness of breath) [R06.02] Right lower quadrant abdominal pain [R10.31] Abdominal aortic aneurysm (AAA) without rupture ([place]) [I71.4] RLQ abdominal pain [R10.31] Patient Active Problem List   Diagnosis Date Noted  . Protein-calorie malnutrition, severe 10/15/2018  . Nodule of upper lobe of right lung 10/15/2018   . RLQ abdominal pain 10/14/2018  . Status post total hip replacement, right 07/01/2018  . Hyperlipidemia 01/30/2018  . Renal artery stenosis ([place]) 12/05/2015  . 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 Syncope and collapse 12/05/2015  . Essential hypertension 10/15/2015  . COPD exacerbation ([place]) 01/31/2015  . Acute respiratory failure ([place]) 01/31/2015  . AAA (abdominal aortic aneurysm) ([place]) 11/23/2014  . Prostate cancer screening 11/02/2014  . Gross hematuria 11/02/2014  . Chest pain on exertion 07/16/2014  . Benign hypertension 07/16/2014  . 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 Coccydynia 02/08/2012  . 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 Gait disorder 02/08/2012   PCP:  Zeb Comfort, MD Pharmacy:   [facility] [phone] [address] [place] [address] Phone: [phone] Fax: [phone]     Social Determinants of Health (SDOH) Interventions    Readmission Risk Interventions No flowsheet data found.

## 2018-10-17 NOTE — Progress Notes (Signed)
Pulmonary Medicine          Date: 10/17/2018,   MRN# [id number] Joseph Peters 08-02-1955     AdmissionWeight: 58.5 kg                 CurrentWeight: 55.2 kg      CHIEF COMPLAINT:   Moderate acute COPD exacerbation and recent findings of right lung spiculated nodules with hilar/mediastinal lymphadenopathy suggestive of primary bronchogenic carcinoma   SUBJECTIVE    Patient reports no overnight events , slept well for first time in >1 week.    Lung auscultation has improved bilaterally.   Cleared to d/c home from pulmonary perspective.   Would recommend to finish Levaquin 750 mg for total of 7 days and discharged on 40 mg prednisone taper over 5 days.  Additional follow-up in pulmonary clinic has been set up for optimization of underlying COPD and possible lung malignancy.  Appreciate hospitalist, oncology and vascular surgery collaboration      PAST MEDICAL HISTORY   Past Medical History:  Diagnosis Date  . AAA (abdominal aortic aneurysm) without rupture ([place])   . Abdominal aneurysm ([place]) 2015   being followed but not large enough to surgically treat  . Anxiety   . Arthritis   . Asthma   . Benign hypertension 07/16/2014  . Chest pain on exertion 07/16/2014  . Chronic pain syndrome   . Coccydynia 02/08/2012  . COPD (chronic obstructive pulmonary disease) ([place])    patient smokes  . Coronary artery disease   . 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 Depression   . 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 IBS (irritable bowel syndrome)   . Myocardial infarction ([place]) 2010  . Restless leg syndrome   . Sciatica   . Spinal stenosis of lumbar region   . Stroke [facility]) 2015   no residual symptoms     SURGICAL HISTORY   Past Surgical History:  Procedure Laterality Date  . CARDIAC CATHETERIZATION  2012  . COLONOSCOPY WITH PROPOFOL N/A 11/14/2016   Procedure: COLONOSCOPY WITH PROPOFOL;  Surgeon: Manya Silvas,  MD;  Location: [facility] ENDOSCOPY;  Service: Endoscopy;  Laterality: N/A;  . ESOPHAGOGASTRODUODENOSCOPY (EGD) WITH PROPOFOL N/A 11/14/2016   Procedure: ESOPHAGOGASTRODUODENOSCOPY (EGD) WITH PROPOFOL;  Surgeon: Manya Silvas, MD;  Location: [facility] ENDOSCOPY;  Service: Endoscopy;  Laterality: N/A;  . KNEE SURGERY     right  . SHOULDER ARTHROSCOPY WITH OPEN ROTATOR CUFF REPAIR Right 08/20/2017   Procedure: SHOULDER ARTHROSCOPY WITH OPEN ROTATOR CUFF REPAIR;  Surgeon: Corky Mull, MD;  Location: [facility] ORS;  Service: Orthopedics;  Laterality: Right;  . SHOULDER ARTHROSCOPY WITH SUBACROMIAL DECOMPRESSION, ROTATOR CUFF REPAIR AND BICEP TENDON REPAIR Left 05/10/2016   Procedure: SHOULDER ARTHROSCOPY WITH DEBTRIDEMENT, DECOMPRESSION, REPAIR OF MASSIVE ROTATOR CUFF TEAR AND BICEP TENODESIS;  Surgeon: Corky Mull, MD;  Location: [facility] ORS;  Service: Orthopedics;  Laterality: Left;  Massive cuff tear  . TOTAL HIP ARTHROPLASTY Right 07/01/2018   Procedure: TOTAL HIP ARTHROPLASTY - RIGHT;  Surgeon: Corky Mull, MD;  Location: [facility] ORS;  Service: Orthopedics;  Laterality: Right;     FAMILY HISTORY   Family History  Problem Relation Age of Onset  . Cancer Mother   . COPD Father 46       black lung     SOCIAL HISTORY   Social History   Tobacco Use  . Smoking status: Current Some Day Smoker    Packs/day:  0.50    Years: 30.00    Pack years: 15.00    Types: Cigarettes  . Smokeless tobacco: Former Systems developer    Quit date: 02/26/1998  Substance Use Topics  . Alcohol use: Yes    Alcohol/week: 0.0 standard drinks    Comment: beer daily  . Drug use: No     MEDICATIONS    Home Medication:    Current Medication:  Current Facility-Administered Medications:  .  0.9 %  sodium chloride infusion, , Intravenous, PRN, Stark Jock, Jude, MD, Stopped at 10/16/18 1804 .  diazepam (VALIUM) tablet 5 mg, 5 mg, Oral, QHS PRN, Lang Snow, NP, 5 mg at 10/15/18 2153 .  enoxaparin (LOVENOX) injection 40 mg, 40  mg, Subcutaneous, Q24H, Ouma, Bing Neighbors, NP, 40 mg at 10/16/18 2121 .  feeding supplement (ENSURE ENLIVE) (ENSURE ENLIVE) liquid 237 mL, 237 mL, Oral, TID BM, Gouru, Aruna, MD, 237 mL at 10/16/18 2119 .  finasteride (PROSCAR) tablet 5 mg, 5 mg, Oral, Daily, Ouma, Bing Neighbors, NP, 5 mg at 10/16/18 0932 .  FLUoxetine (PROZAC) capsule 20 mg, 20 mg, Oral, Daily, Ouma, Bing Neighbors, NP, 20 mg at 10/16/18 0932 .  gabapentin (NEURONTIN) capsule 900 mg, 900 mg, Oral, QHS, Ouma, Bing Neighbors, NP, 900 mg at 10/16/18 2120 .  hydrALAZINE (APRESOLINE) injection 10 mg, 10 mg, Intravenous, Q6H PRN, Seals, Angela H, NP, 10 mg at 10/15/18 0201 .  ipratropium-albuterol (DUONEB) 0.5-2.5 (3) MG/3ML nebulizer solution 3 mL, 3 mL, Nebulization, TID, Ojie, Jude, MD, 3 mL at 10/17/18 0723 .  levofloxacin (LEVAQUIN) IVPB 750 mg, 750 mg, Intravenous, Q24H, Ouma, Bing Neighbors, NP, Stopped at 10/16/18 1824 .  losartan (COZAAR) tablet 25 mg, 25 mg, Oral, Daily, Ouma, Bing Neighbors, NP, 25 mg at 10/16/18 0932 .  metoprolol tartrate (LOPRESSOR) tablet 25 mg, 25 mg, Oral, BID, Ojie, Jude, MD .  montelukast (SINGULAIR) tablet 10 mg, 10 mg, Oral, QHS, Ouma, Bing Neighbors, NP, 10 mg at 10/16/18 2120 .  multivitamin with minerals tablet 1 tablet, 1 tablet, Oral, Daily, Gouru, Aruna, MD, 1 tablet at 10/16/18 0931 .  nicotine (NICODERM CQ - dosed in mg/24 hours) patch 21 mg, 21 mg, Transdermal, Daily, Ouma, Bing Neighbors, NP, 21 mg at 10/16/18 1808 .  oxyCODONE (Oxy IR/ROXICODONE) immediate release tablet 10 mg, 10 mg, Oral, QID PRN, Lang Snow, NP, 10 mg at 10/15/18 2152 .  pantoprazole (PROTONIX) EC tablet 40 mg, 40 mg, Oral, Daily, Ouma, Bing Neighbors, NP, 40 mg at 10/16/18 0931 .  predniSONE (DELTASONE) tablet 40 mg, 40 mg, Oral, Q breakfast, Ouma, Bing Neighbors, NP, 40 mg at 10/16/18 0932 .  simvastatin (ZOCOR) tablet 40 mg, 40 mg, Oral, QHS, Ouma, Bing Neighbors, NP,  40 mg at 10/16/18 2120 .  tamsulosin (FLOMAX) capsule 0.4 mg, 0.4 mg, Oral, QPC supper, Lang Snow, NP, 0.4 mg at 10/16/18 1807 .  tiZANidine (ZANAFLEX) tablet 4 mg, 4 mg, Oral, TID PRN, Lang Snow, NP, 4 mg at 10/16/18 1303 .  vitamin C (ASCORBIC ACID) tablet 250 mg, 250 mg, Oral, BID, Gouru, Aruna, MD, 250 mg at 10/16/18 2120    ALLERGIES   Penicillins and Lidocaine     REVIEW OF SYSTEMS    Review of Systems:  Gen:  Denies  fever, sweats, chills weigh loss  HEENT: Denies blurred vision, double vision, ear pain, eye pain, hearing loss, nose bleeds, sore throat Cardiac:  No dizziness, chest pain or heaviness, chest tightness,edema Resp:   Denies cough or sputum  porduction, shortness of breath,wheezing, hemoptysis,  Gi: Denies swallowing difficulty, stomach pain, nausea or vomiting, diarrhea, constipation, bowel incontinence Gu:  Denies bladder incontinence, burning urine Ext:   Denies Joint pain, stiffness or swelling Skin: Denies  skin rash, easy bruising or bleeding or hives Endoc:  Denies polyuria, polydipsia , polyphagia or weight change Psych:   Denies depression, insomnia or hallucinations   Other:  All other systems negative   VS: BP (!) 167/98 (BP Location: Right Arm)   Pulse 78   Temp 98 F (36.7 C) (Oral)   Resp 20   Ht 5\' 6"  (1.676 m)   Wt 55.2 kg   SpO2 100%   BMI 19.64 kg/m      PHYSICAL EXAM    GENERAL:NAD, no fevers, chills, no weakness no fatigue HEAD: Normocephalic, atraumatic.  EYES: Pupils equal, round, reactive to light. Extraocular muscles intact. No scleral icterus.  MOUTH: Moist mucosal membrane. Dentition intact. No abscess noted.  EAR, NOSE, THROAT: Clear without exudates. No external lesions.  NECK: Supple. No thyromegaly. No nodules. No JVD.  PULMONARY: Decreased breath sounds with mild rhonchorous sounds appreciated at the right lung base CARDIOVASCULAR: S1 and S2. Regular rate and rhythm. No murmurs, rubs,  or gallops. No edema. Pedal pulses 2+ bilaterally.  GASTROINTESTINAL: Soft, nontender, nondistended. No masses. Positive bowel sounds. No hepatosplenomegaly.  MUSCULOSKELETAL: No swelling, clubbing, or edema. Range of motion full in all extremities.  NEUROLOGIC: Cranial nerves II through XII are intact. No gross focal neurological deficits. Sensation intact. Reflexes intact.  SKIN: No ulceration, lesions, rashes, or cyanosis. Skin warm and dry. Turgor intact.  PSYCHIATRIC: Mood, affect within normal limits. The patient is awake, alert and oriented x 3. Insight, judgment intact.       IMAGING    Dg Chest 2 View  Result Date: 10/14/2018 CLINICAL DATA:  Chest and abdominal pain. EXAM: CHEST - 2 VIEW COMPARISON:  07/01/2018 FINDINGS: Airspace disease in the right upper lobe in the area of prior nodule seen on CT. Severe underlying COPD/[place]. Heart is normal size. Left lung clear. No effusions or acute bony abnormality. IMPRESSION: Severe COPD. Peripheral airspace disease in the right upper lobe in the area of prior pulmonary nodule. This could reflect pneumonia. Followup PA and lateral chest X-ray is recommended in 3-4 weeks following trial of antibiotic therapy to ensure resolution and exclude underlying malignancy. Electronically Signed   By: Rolm Baptise M.D.   On: 10/14/2018 08:08   Ct Head Wo Contrast  Result Date: 10/15/2018 CLINICAL DATA:  Right eye pain.  Possible facial droop. EXAM: CT HEAD WITHOUT CONTRAST TECHNIQUE: Contiguous axial images were obtained from the base of the skull through the vertex without intravenous contrast. COMPARISON:  None. FINDINGS: Brain: No evidence of acute infarction, hemorrhage, hydrocephalus, extra-axial collection or mass lesion/mass effect. Vascular: No hyperdense vessel or unexpected calcification. Skull: Normal. Negative for fracture or focal lesion. Sinuses/Orbits: No acute finding. Other: None. IMPRESSION: No acute intracranial findings.  Electronically Signed   By: Davina Poke M.D.   On: 10/15/2018 15:00   Dg Chest Port 1 View  Result Date: 10/14/2018 CLINICAL DATA:  Right lower quadrant pain and emesis. EXAM: PORTABLE CHEST 1 VIEW COMPARISON:  October 14, 2018 FINDINGS: Cardiomediastinal silhouette is normal. Mediastinal contours appear intact. Advanced upper lobe predominant emphysema. Persistent large area of subpleural airspace consolidation in the right upper lobe, in the site of previously seen pulmonary nodule. Osseous structures are without acute abnormality. Soft tissues are grossly normal. IMPRESSION: 1. Persistent  large area of subpleural airspace consolidation in the right upper lobe, at the site of previously seen pulmonary nodule. 2. Advanced upper lobe predominant emphysema. Electronically Signed   By: Fidela Salisbury M.D.   On: 10/14/2018 15:46   Ct Angio Chest/abd/pel For Dissection W And/or Wo Contrast  Result Date: 10/14/2018 CLINICAL DATA:  History of AAA, COPD, HTN, here with severe abd pain. Pt was in usual state of health until last night. He states he ate soup for dinner then felt fine, was going to bed around midnight when he had multiple episodes of coughing and vomiting. He had no abd pain at the time however. He awoke this morning with ongoing nausea but no additional vomiting, along with severe burning, aching, epigastric and R sided abd pain. The pain then became primarily RLQ. He's had subjective chills but no fevers. No diarrhea. Pain is worse w/ palpation and eating EXAM: CT ANGIOGRAPHY CHEST, ABDOMEN AND PELVIS TECHNIQUE: Multidetector CT imaging through the chest, abdomen and pelvis was performed using the standard protocol during bolus administration of intravenous contrast. Multiplanar reconstructed images and MIPs were obtained and reviewed to evaluate the vascular anatomy. CONTRAST:  1mL OMNIPAQUE IOHEXOL 350 MG/ML SOLN COMPARISON:  08/13/2018 and previous FINDINGS: CTA CHEST FINDINGS  Cardiovascular: Heart size normal. No pericardial effusion. RV is nondilated. Fair contrast opacification of pulmonary artery branches; the exam was not optimized for detection of pulmonary emboli. Coronary calcifications. Aortic valve leaflet calcifications. Good contrast opacification of the thoracic aorta. No dissection or stenosis. Maximum transverse dimensions as follows: 3.8 cm sinuses of Valsalva 3.1 cm sino-tubular junction 4.4 cm mid ascending (previously 4.2) 3.4 cm distal ascending/proximal arch 2.8 cm distal arch 2.7 cm proximal descending 3.7 cm distal descending saccular aneurysm vs penetrating atheromatous ulcer (previously 3.4) 2.8 cm supra celiac Mediastinum/Nodes: No mediastinal hematoma. 1.2 cm right hilar lymph node image 51/6, previously 0.9 cm. No mediastinal adenopathy. Lungs/Pleura: No pleural effusion. No pneumothorax. Advanced pulmonary emphysema with new patchy peripheral airspace consolidation in the anterior right upper lobe. This obscures the right upper lobe nodules described previously. 0.6 cm probable intrapulmonary lymph node image 92/7, stable. Left lung clear. Musculoskeletal: Stable mild midthoracic compression deformity. No fracture or worrisome bone lesion. Review of the MIP images confirms the above findings. CTA ABDOMEN AND PELVIS FINDINGS VASCULAR Aorta: Moderate calcified atheromatous plaque. No dissection or stenosis. Fusiform infrarenal aneurysm 4.8 x 4.4 cm maximum transverse dimensions, previously 3.7 cm on 12/01/2015 by my measurement. Aneurysm terminates at the bifurcation. Extensive nonocclusive mural thrombus in the aneurysmal segment. No evidence of rupture. Celiac: Short-segment origin stenosis of possible hemodynamic significance, patent distally. 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 No stenosis. Distal branching otherwise unremarkable. Renals: Single left, with ostial plaque resulting in short segment stenosis of at least mild  severity. Duplicated right renal arteries. The superior right renal artery is dominant, and occluded from its origin over length of at least 2 cm, reconstituted distally. The inferior right renal artery is aberrant and diminutive, arising from the aneurysmal segment of the aorta at the same level of the IMA, remains patent. IMA: Arises from the aneurysmal segment of the aorta. Patent without evidence of aneurysm, dissection, vasculitis or significant stenosis. Inflow: On the right, Short-segment origin stenosis of the common iliac artery of possible hemodynamic significance. Calcified eccentric plaque extends distally through its length without tandem significant stenosis or aneurysm. Internal iliac is atheromatous, patent. Right external iliac and common femoral arteries patent. On the left,  calcified atheromatous plaque through the length of the common and internal iliac arteries without high-grade stenosis or aneurysm. External iliac and common femoral patent. Veins: No obvious venous abnormality within the limitations of this arterial phase study. Review of the MIP images confirms the above findings. NON-VASCULAR Hepatobiliary: No focal liver abnormality is seen. No gallstones, gallbladder wall thickening, or biliary dilatation. Pancreas: Unremarkable. No pancreatic ductal dilatation or surrounding inflammatory changes. Spleen: Normal in size without focal abnormality. Adrenals/Urinary Tract: Normal adrenals. Progressive right renal parenchymal atrophy with poor enhancement of the upper pole likely related to superior right renal artery proximal occlusion. No hydronephrosis. No solid renal mass. Urinary bladder physiologically distended. Stomach/Bowel: Stomach is nondistended. Small bowel decompressed. Normal appendix. The colon is nondilated, unremarkable. Lymphatic: No abdominal or pelvic adenopathy. Reproductive: Prostate is unremarkable. Other: No ascites. No free air. Musculoskeletal: Asymmetric thickening  of the crus of the diaphragm on the right stable since 12/01/2015. Lower lumbar spondylitic change. No fracture or worrisome bone lesion. Right hip arthroplasty without dislocation, hardware incompletely visualized. Review of the MIP images confirms the above findings. IMPRESSION: 1. No acute PE, thoracic aortic dissection, or aneurysm rupture. 2. Slight enlargement of 4.4 cm ascending thoracic aortic aneurysm. Recommend annual imaging followup by CTA or MRA. This recommendation follows 2010 ACCF/AHA/AATS/ACR/ASA/SCA/SCAI/SIR/STS/SVM Guidelines for the Diagnosis and Management of Patients with Thoracic Aortic Disease. Circulation. 2010; 121: T267-T245. Aortic aneurysm NOS (ICD10-I71.9) 3. 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 4. Enlarging 4.8 cm fusiform infrarenal abdominal aortic aneurysm (previously 3.7 cm in 2017). Recommend followup by abdomen and pelvis CTA in 6 months, and vascular surgery referral/consultation if not already obtained. This recommendation follows ACR consensus guidelines: White Paper of the ACR Incidental Findings Committee II on Vascular Findings. J Am Coll Radiol 2013; 10:789-794. Aortic aneurysm NOS (ICD10-I71.9) 5. Short-segment origin stenosis of the right common iliac artery of possible hemodynamic significance. Correlate with clinical symptomatology and ABIs. 6. 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 Aortic aneurysm NOS (ICD10-I71.9). Aortic Atherosclerosis (ICD10-I70.0) and Emphysema (ICD10-J43.9). Electronically Signed   By: Lucrezia Europe M.D.   On: 10/14/2018 09:44       PET CT 09/01/18 IMPRESSION: 1. Hypermetabolic (max SUV 80.9) irregular solid 1.8 cm peripheral right upper lobe pulmonary nodule, compatible with primary bronchogenic carcinoma, increased in size from recent chest CT. 2. Hypermetabolic more central right upper lobe 0.8 cm  solid pulmonary nodule, also increased in size, suspicious for pulmonary metastasis to the same lobe. 3. Hypermetabolic right hilar lymph node suspicious for metastatic disease. No hypermetabolic mediastinal adenopathy. 4. No hypermetabolic distant metastatic disease. 5. Infrarenal 4.5 cm Abdominal Aortic Aneurysm (ICD10-I71.9). Recommend follow-up CTA of the abdomen and pelvis in 6 months and vascular surgery referral/consultation. This recommendation follows ACR consensus guidelines: White Paper of the ACR Incidental Findings Committee II on Vascular Findings. J Am Coll Radiol 2013; 10:789-794. 6. Aortic Atherosclerosis (ICD10-I70.0) and Emphysema (ICD10-J43.9).  pe  ASSESSMENT/PLAN   Moderate acute exacerbation of COPD -COPD care path -Levaquin 750 p.o. daily -Prednisone 40 mg p.o. daily-  fast taper due to history of avascular necrosis -Incentive spirometer at bedside -MetaNeb therapy with saline every 6 hours -DuoNebs every 6 hours while awake -Evaluate need for home O2 therapy when optimized for DC -procalcitonin is negative    Spiculated right lung nodule and ipsilateral satellite lesion with PET avid hilar lymphadenopathy -High pretest probability of lung malignancy -Noted oncology input - appreciate  recommendations - Dr Rogue Bussing - will be available to assist with tissue diagnosis as deemed appropriate by tumor board     Tobacco abuse  -Nicotine replacement therapy with transdermal patch -Smoking cessation counseling provided for over 10 minutes today -Patient states he will quit completely immediately       Thank you for allowing me to participate in the care of this patient.   Patient/Family are satisfied with care plan and all questions have been answered.  This document was prepared using Dragon voice recognition software and may include unintentional dictation errors.     Ottie Glazier, M.D.  Division of [place]

## 2018-10-17 NOTE — Discharge Summary (Signed)
[facility] [place] NAME: Joseph Peters    MR#:  [id number]  DATE OF BIRTH:  [birth year]  DATE OF ADMISSION:  10/14/2018   ADMITTING PHYSICIAN: Lang Snow, NP  DATE OF DISCHARGE: 10/17/2018  PRIMARY CARE PHYSICIAN: Zeb Comfort, MD   ADMISSION DIAGNOSIS:  SOB (shortness of breath) [R06.02] Right lower quadrant abdominal pain [R10.31] Abdominal aortic aneurysm (AAA) without rupture ([place]) [I71.4] RLQ abdominal pain [R10.31] DISCHARGE DIAGNOSIS:  Active Problems:   RLQ abdominal pain   Protein-calorie malnutrition, severe   Nodule of upper lobe of right lung  SECONDARY DIAGNOSIS:   Past Medical History:  Diagnosis Date  . AAA (abdominal aortic aneurysm) without rupture ([place])   . Abdominal aneurysm ([place]) 2015   being followed but not large enough to surgically treat  . Anxiety   . Arthritis   . Asthma   . Benign hypertension 07/16/2014  . Chest pain on exertion 07/16/2014  . Chronic pain syndrome   . Coccydynia 02/08/2012  . COPD (chronic obstructive pulmonary disease) ([place])    patient smokes  . Coronary artery disease   . 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 Depression   . 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 IBS (irritable bowel syndrome)   . Myocardial infarction ([place]) 2010  . Restless leg syndrome   . Sciatica   . Spinal stenosis of lumbar region   . Stroke [facility]) 2015   no residual symptoms   HOSPITAL COURSE:  Chief complaint; abdominal pain  HPI; 63 year old male with past medical history of advanced COPD, chronic pain syndrome, CAD, IBS, MI, restless leg syndrome, CVA, AAA, anxiety, depression, tobacco abuse, alcoholism, and hypertension presenting with right lower quadrant pain. Patient was recently seen by pulmonologist Dr. Raul Del on 09/29/2018 for severe COPD, and phenotype.  Had a PET scan which showed right upper lung and right hilar lesions concerning  for malignancy.  Patient states he is trying to quit smoking given possible malignancy and history of AAA. CT angio chest abdomen and pelvis showed no acute PE, slightly enlarging 4.8 cm abdominal aortic aneurysm noted with progressive right renal artery occlusion.  Chest x-ray shows right upper lobe consolidation also noted on CTA.  Patient was diagnosed with COPD exacerbation and pneumonia and admitted to medical service for further evaluation.   Hospital course; 1.Right lower quadrant pain- Felt to be likely due to right lower lobe pneumonia.  Treated with IV antibiotics and significantly improved clinically.  Abdominal pains resolved. CT abdomen shows no new acute findings  2. Rightupperlobe pneumonia - Chest xray showslarge area of subpleural airspace consolidation in the right upper lobe -Patient allergic to penicillin.  Patient treated empirically with levofloxacin.  Seen by pulmonologist.  Being transitioned to p.o. levofloxacin to complete treatment duration  3.Acute COPD Exacerbation secondary to pneumonia- Patient with hx of Advanced COPD with recent PET scan  Clinically improved with IV steroids.  Transition to p.o. prednisone taper.  Nebulizer treatment.  Appreciate input from pulmonologist.  Home oxygen therapy evaluation done this morning.  Patient does not require home oxygen therapy on discharge.  4.Severe right eye pain ; seen by neurologist.  Felt to be secondary to cluster headache.  Patient reports significant improvement already.  No right eye pain this morning.  CT head done was negative.  Seen by ophthalmologist according to patient yesterday but official consult note not yet seen. Reviewed notes from neurologist later today.  Felt to be likely due to cluster headaches that resolved with oxygen.  No further imaging studies recommended  5.HLD  + Goal LDL<100 - Simvastatin 40mg  PO qhs  6.HTN  Continue home blood pressure medications.  Follow-up with primary  care physician for monitoring  7.History of AAA-CT angios chest/abdomen/pelvis shows enlarging aneurysm 4.8 cm previously 3.7 cm in 2017 and short segment stenosis of the right common iliac artery, progressive right renal artery occlusion.  Findings discussed with vascular surgeon Dr. Lucky Cowboy.  Patient seen by cardiologist for preop cardiac evaluation prior to anticipated surgery by Dr. Lucky Cowboy.  Seen by Dr. Ubaldo Glassing.  He recommends follow-up as outpatient for functional study as outpatient.  8.Coronary Artery Disease- hx of MI - ASA 81mg  PO daily 9.Irregular solid 1.8 cm peripheral right upper lobe pulmonary nodule, compatible with primary bronchogenic carcinoma, increased in size from recent chest CT. Hypermetabolic more central right upper lobe 0.8 cm solid pulmonary nodule, also increased in size, suspicious for pulmonary metastasis to the same lobe. Seen by oncologist.  Recommendation was to hold off on investigation for malignancy at this time given current treatment for pneumonia.  To reevaluate in the next few weeks once respiratory status more stable.  Follow-up with oncologist post discharge.  10. Tobacco abuse -Smoking cessation counseling provided -Nicotine patch provided  DISCHARGE CONDITIONS:  Stable CONSULTS OBTAINED:  Treatment Team:  Ottie Glazier, MD Leotis Pain, MD Leandrew Koyanagi, MD Teodoro Spray, MD DRUG ALLERGIES:   Allergies  Allergen Reactions  . Penicillins Swelling, Rash and Other (See Comments)    Did it involve swelling of the face/tongue/throat, SOB, or low BP? yes Did it involve sudden or severe rash/hives, skin peeling, or any reaction on the inside of your mouth or nose? no Did you need to seek medical attention at a hospital or doctor's office? Yes When did it last happen?years  If all above answers are "NO", may proceed with cephalosporin use.    . Lidocaine Other (See Comments)    unsure   DISCHARGE MEDICATIONS:   Allergies  as of 10/17/2018      Reactions   Penicillins Swelling, Rash, Other (See Comments)   Did it involve swelling of the face/tongue/throat, SOB, or low BP? yes Did it involve sudden or severe rash/hives, skin peeling, or any reaction on the inside of your mouth or nose? no Did you need to seek medical attention at a hospital or doctor's office? Yes When did it last happen?years  If all above answers are "NO", may proceed with cephalosporin use.   Lidocaine Other (See Comments)   unsure      Medication List    TAKE these medications   albuterol 108 (90 Base) MCG/ACT inhaler Commonly known as: VENTOLIN HFA Inhale 2 puffs into the lungs every 4 (four) hours as needed for wheezing or shortness of breath.   aspirin EC 81 MG tablet Take 81 mg by mouth at bedtime.   Daliresp 250 MCG Tabs Generic drug: Roflumilast Take 250 mcg by mouth at bedtime. What changed: how much to take   diazepam 5 MG tablet Commonly known as: VALIUM Take 5 mg by mouth at bedtime as needed (restless legs).   feeding supplement (ENSURE ENLIVE) Liqd Take 237 mLs by mouth 3 (three) times daily between meals.   finasteride 5 MG tablet Commonly known as: PROSCAR Take 1 tablet (5 mg total) by mouth daily.   FLUoxetine 20 MG capsule Commonly known as: PROZAC Take 20 mg by mouth daily.  gabapentin 300 MG capsule Commonly known as: NEURONTIN Take 900 mg by mouth at bedtime.   levofloxacin 750 MG tablet Commonly known as: Levaquin Take 1 tablet (750 mg total) by mouth daily for 3 days.   losartan 25 MG tablet Commonly known as: COZAAR Take 25 mg by mouth daily.   metoprolol tartrate 25 MG tablet Commonly known as: LOPRESSOR Take 1 tablet (25 mg total) by mouth 2 (two) times daily.   montelukast 10 MG tablet Commonly known as: SINGULAIR Take 10 mg by mouth at bedtime.   multivitamin with minerals Tabs tablet Take 1 tablet by mouth daily. Start taking on: October 18, 2018   nicotine 21 mg/24hr  patch Commonly known as: NICODERM CQ - dosed in mg/24 hours Place 1 patch (21 mg total) onto the skin daily.   omeprazole 20 MG capsule Commonly known as: PRILOSEC Take 20 mg by mouth daily.   Oxycodone HCl 10 MG Tabs Take 10 mg by mouth 4 (four) times daily as needed for pain.   predniSONE 10 MG tablet Commonly known as: DELTASONE Prednisone 40 mg p.o. daily x1. Then 30 mg p.o. daily x1 day Then 20 mg p.o. daily x1 day Then 10 mg p.o. daily x1 day   simvastatin 40 MG tablet Commonly known as: ZOCOR Take 40 mg by mouth at bedtime.   tamsulosin 0.4 MG Caps capsule Commonly known as: FLOMAX Take 1 capsule (0.4 mg total) by mouth daily after supper.   tiZANidine 4 MG tablet Commonly known as: ZANAFLEX Take 4 mg by mouth 3 (three) times daily as needed for muscle spasms.   Trelegy Ellipta 100-62.5-25 MCG/INH Aepb Generic drug: Fluticasone-Umeclidin-Vilant Inhale 2 puffs into the lungs at bedtime.        DISCHARGE INSTRUCTIONS:   DIET:  Cardiac diet DISCHARGE CONDITION:  Stable ACTIVITY:  Activity as tolerated OXYGEN:  Home Oxygen: No.  Oxygen Delivery: room air DISCHARGE LOCATION:  home   If you experience worsening of your admission symptoms, develop shortness of breath, life threatening emergency, suicidal or homicidal thoughts you must seek medical attention immediately by calling 911 or calling your MD immediately  if symptoms less severe.  You Must read complete instructions/literature along with all the possible adverse reactions/side effects for all the Medicines you take and that have been prescribed to you. Take any new Medicines after you have completely understood and accpet all the possible adverse reactions/side effects.   Please note  You were cared for by a hospitalist during your hospital stay. If you have any questions about your discharge medications or the care you received while you were in the hospital after you are discharged, you can call  the unit and asked to speak with the hospitalist on call if the hospitalist that took care of you is not available. Once you are discharged, your primary care physician will handle any further medical issues. Please note that NO REFILLS for any discharge medications will be authorized once you are discharged, as it is imperative that you return to your primary care physician (or establish a relationship with a primary care physician if you do not have one) for your aftercare needs so that they can reassess your need for medications and monitor your lab values.    On the day of Discharge:  VITAL SIGNS:  Blood pressure (!) 141/85, pulse 73, temperature 98 F (36.7 C), temperature source Oral, resp. rate 20, height 5\' 6"  (1.676 m), weight 55.2 kg, SpO2 98 %. PHYSICAL EXAMINATION:  GENERAL:  63 y.o.-year-old patient lying in the bed with no acute distress.  EYES: Pupils equal, round, reactive to light and accommodation. No scleral icterus. Extraocular muscles intact.  HEENT: Head atraumatic, normocephalic. Oropharynx and nasopharynx clear.  NECK:  Supple, no jugular venous distention. No thyroid enlargement, no tenderness.  LUNGS: Normal breath sounds bilaterally, no wheezing, rales,rhonchi or crepitation. No use of accessory muscles of respiration.  CARDIOVASCULAR: S1, S2 normal. No murmurs, rubs, or gallops.  ABDOMEN: Soft, non-tender, non-distended. Bowel sounds present. No organomegaly or mass.  EXTREMITIES: No pedal edema, cyanosis, or clubbing.  NEUROLOGIC: Cranial nerves II through XII are intact. Muscle strength 5/5 in all extremities. Sensation intact. Gait not checked.  PSYCHIATRIC: The patient is alert and oriented x 3.  SKIN: No obvious rash, lesion, or ulcer.  DATA REVIEW:   CBC Recent Labs  Lab 10/14/18 0640  WBC 11.9*  HGB 14.9  HCT 44.4  PLT 303    Chemistries  Recent Labs  Lab 10/14/18 0640 10/17/18 0550  NA 134* 135  K 4.4 3.8  CL 101 102  CO2 24 28  GLUCOSE  103* 95  BUN 7* 17  CREATININE 1.03 0.91  CALCIUM 9.1 8.6*  MG  --  2.1  AST 18  --   ALT 10  --   ALKPHOS 94  --   BILITOT 1.0  --      Microbiology Results  Results for orders placed or performed during the hospital encounter of 10/14/18  SARS CORONAVIRUS 2 Nasal Swab Aptima Multi Swab     Status: None   Collection Time: 10/14/18 11:12 AM   Specimen: Aptima Multi Swab; Nasal Swab  Result Value Ref Range Status   SARS Coronavirus 2 NEGATIVE NEGATIVE Final    Comment: (NOTE) SARS-CoV-2 target nucleic acids are NOT DETECTED. The SARS-CoV-2 RNA is generally detectable in upper and lower respiratory specimens during the acute phase of infection. Negative results do not preclude SARS-CoV-2 infection, do not rule out co-infections with other pathogens, and should not be used as the sole basis for treatment or other patient management decisions. Negative results must be combined with clinical observations, patient history, and epidemiological information. The expected result is Negative. Fact Sheet for Patients: SugarRoll.be Fact Sheet for Healthcare Providers: https://www.woods-mathews.com/ This test is not yet approved or cleared by the [place] FDA and  has been authorized for detection and/or diagnosis of SARS-CoV-2 by FDA under an Emergency Use Authorization (EUA). This EUA will remain  in effect (meaning this test can be used) for the duration of the COVID-19 declaration under Section 56 4(b)(1) of the Act, 21 U.S.C. section 360bbb-3(b)(1), unless the authorization is terminated or revoked sooner. Performed at [facility], [place] [address], [place], [place] [address]   SARS Coronavirus 2 [facility] order, Performed in [facility] hospital lab) Nasopharyngeal Nasopharyngeal Swab     Status: None   Collection Time: 10/14/18  3:20 PM   Specimen: Nasopharyngeal Swab  Result Value Ref Range Status   SARS Coronavirus 2  NEGATIVE NEGATIVE Final    Comment: (NOTE) If result is NEGATIVE SARS-CoV-2 target nucleic acids are NOT DETECTED. The SARS-CoV-2 RNA is generally detectable in upper and lower  respiratory specimens during the acute phase of infection. The lowest  concentration of SARS-CoV-2 viral copies this assay can detect is 250  copies / mL. A negative result does not preclude SARS-CoV-2 infection  and should not be used as the sole basis for treatment or other  patient management  decisions.  A negative result may occur with  improper specimen collection / handling, submission of specimen other  than nasopharyngeal swab, presence of viral mutation(s) within the  areas targeted by this assay, and inadequate number of viral copies  (<250 copies / mL). A negative result must be combined with clinical  observations, patient history, and epidemiological information. If result is POSITIVE SARS-CoV-2 target nucleic acids are DETECTED. The SARS-CoV-2 RNA is generally detectable in upper and lower  respiratory specimens dur ing the acute phase of infection.  Positive  results are indicative of active infection with SARS-CoV-2.  Clinical  correlation with patient history and other diagnostic information is  necessary to determine patient infection status.  Positive results do  not rule out bacterial infection or co-infection with other viruses. If result is PRESUMPTIVE POSTIVE SARS-CoV-2 nucleic acids MAY BE PRESENT.   A presumptive positive result was obtained on the submitted specimen  and confirmed on repeat testing.  While 2019 novel coronavirus  (SARS-CoV-2) nucleic acids may be present in the submitted sample  additional confirmatory testing may be necessary for epidemiological  and / or clinical management purposes  to differentiate between  SARS-CoV-2 and other Sarbecovirus currently known to infect humans.  If clinically indicated additional testing with an alternate test  methodology [phone])  is advised. The SARS-CoV-2 RNA is generally  detectable in upper and lower respiratory sp ecimens during the acute  phase of infection. The expected result is Negative. Fact Sheet for Patients:  StrictlyIdeas.no Fact Sheet for Healthcare Providers: BankingDealers.co.za This test is not yet approved or cleared by the [place] FDA and has been authorized for detection and/or diagnosis of SARS-CoV-2 by FDA under an Emergency Use Authorization (EUA).  This EUA will remain in effect (meaning this test can be used) for the duration of the COVID-19 declaration under Section 564(b)(1) of the Act, 21 U.S.C. section 360bbb-3(b)(1), unless the authorization is terminated or revoked sooner. Performed at [facility], [address] [place], [place] [address]   Culture, blood (routine x 2) Call MD if unable to obtain prior to antibiotics being given     Status: None (Preliminary result)   Collection Time: 10/14/18  3:27 PM   Specimen: BLOOD  Result Value Ref Range Status   Specimen Description BLOOD RIGHT ANTECUBITAL  Final   Special Requests   Final    BOTTLES DRAWN AEROBIC AND ANAEROBIC Blood Culture results may not be optimal due to an excessive volume of blood received in culture bottles   Culture   Final    NO GROWTH 3 DAYS Performed at [facility], [address] [place], [place] [address]    Report Status PENDING  Incomplete  Culture, blood (routine x 2) Call MD if unable to obtain prior to antibiotics being given     Status: None (Preliminary result)   Collection Time: 10/14/18  3:32 PM   Specimen: BLOOD  Result Value Ref Range Status   Specimen Description BLOOD LEFT ANTECUBITAL  Final   Special Requests   Final    BOTTLES DRAWN AEROBIC AND ANAEROBIC Blood Culture results may not be optimal due to an excessive volume of blood received in culture bottles   Culture   Final    NO GROWTH 3 DAYS Performed at  [facility], [address], [place], [place] [address]    Report Status PENDING  Incomplete    RADIOLOGY:  No results found.   Management plans discussed with the patient, family  and they are in agreement.  CODE STATUS: Full Code   TOTAL TIME TAKING CARE OF THIS PATIENT: 38 minutes.    Desteni Piscopo M.D on 10/17/2018 at 12:32 PM  Between 7am to 6pm - Pager - [phone]  After 6pm go to [email or web address] - Proofreader  [facility]  Office  [phone]  CC: Primary care physician; Zeb Comfort, MD   Note: This dictation was prepared with Dragon dictation along with smaller phrase technology. Any transcriptional errors that result from this process are unintentional.

## 2018-10-19 LAB — CULTURE, BLOOD (ROUTINE X 2)
Culture: NO GROWTH
Culture: NO GROWTH

## 2018-10-22 ENCOUNTER — Other Ambulatory Visit: Payer: Self-pay | Admitting: Family Medicine

## 2018-10-22 ENCOUNTER — Ambulatory Visit
Admission: RE | Admit: 2018-10-22 | Discharge: 2018-10-22 | Disposition: A | Discharge status: Home / Self Care | Payer: Medicare Other | Source: Ambulatory Visit | Attending: Family Medicine | Admitting: Family Medicine

## 2018-10-22 ENCOUNTER — Ambulatory Visit
Admission: RE | Admit: 2018-10-22 | Discharge: 2018-10-22 | Disposition: A | Discharge status: Home / Self Care | Payer: Medicare Other | Attending: Family Medicine | Admitting: Family Medicine

## 2018-10-22 ENCOUNTER — Other Ambulatory Visit: Payer: Self-pay

## 2018-10-22 DIAGNOSIS — J189 Pneumonia, unspecified organism: Secondary | ICD-10-CM | POA: Diagnosis present

## 2018-10-23 ENCOUNTER — Telehealth (INDEPENDENT_AMBULATORY_CARE_PROVIDER_SITE_OTHER): Payer: Self-pay

## 2018-10-23 ENCOUNTER — Telehealth: Payer: Self-pay | Admitting: Internal Medicine

## 2018-10-23 ENCOUNTER — Other Ambulatory Visit: Payer: Medicare Other

## 2018-10-23 DIAGNOSIS — R911 Solitary pulmonary nodule: Secondary | ICD-10-CM

## 2018-10-23 NOTE — Telephone Encounter (Signed)
Spoke with the patients wife on 10/22/2018 and again today to give her the appt day and time of 10/28/2018 @ 1:15 pm for an appt with Dr. Lucky Cowboy to discuss and ask questions about his surgery that is scheduled for 11/05/2018. Patients wife stated that the patient missed his stress test at [facility] so everything has changed. She wanted to know should he keep the appt in our office and I stated that he should so all questions can be answered.

## 2018-10-23 NOTE — Telephone Encounter (Signed)
Reviewed the tumor conference-imaging/PET scan -highly concerning for malignancy right upper lobe; question uptake in the hilar node.  Unfortunately recent CT scan shows pneumonia in the same area-difficult biopsy at this time.  Recommend resolution of pneumonia prior to proceeding with biopsy.  I spoke to pt's wife, [name] of the above recommendations. Awaiting- repair for AAA with Dr.Dew.  Please have the pt follow up in ~4 weeks-with MD/labs- cbc/cmp.  GB

## 2018-10-23 NOTE — Progress Notes (Signed)
Tumor Board Documentation  Joseph Peters was presented by Dr Rogue Bussing at our Tumor Board on 10/23/2018, which included representatives from medical oncology, radiation oncology, pathology, radiology, surgical, surgical oncology, navigation, internal medicine, pharmacy, genetics, pulmonology, palliative care, research.  Joseph Peters currently presents as a new patient, for discussion with history of the following treatments: active survellience.  Additionally, we reviewed previous medical and familial history, history of present illness, and recent lab results along with all available histopathologic and imaging studies. The tumor board considered available treatment options and made the following recommendations: Active surveillance Rescan after condition improves  The following procedures/referrals were also placed: No orders of the defined types were placed in this encounter.   Clinical Trial Status: not discussed   Staging used: Not Applicable  National site-specific guidelines   were discussed with respect to the case.  Tumor board is a meeting of clinicians from various specialty areas who evaluate and discuss patients for whom a multidisciplinary approach is being considered. Final determinations in the plan of care are those of the provider(s). The responsibility for follow up of recommendations given during tumor board is that of the provider.   Today's extended care, comprehensive team conference, Joseph Peters was not present for the discussion and was not examined.   Multidisciplinary Tumor Board is a multidisciplinary case peer review process.  Decisions discussed in the Multidisciplinary Tumor Board reflect the opinions of the specialists present at the conference without having examined the patient.  Ultimately, treatment and diagnostic decisions rest with the primary provider(s) and the patient.

## 2018-10-24 NOTE — Addendum Note (Signed)
Addended by: Sabino Gasser on: 10/24/2018 09:23 AM   Modules accepted: Orders

## 2018-10-27 ENCOUNTER — Telehealth: Payer: Self-pay | Admitting: Internal Medicine

## 2018-10-27 ENCOUNTER — Other Ambulatory Visit (INDEPENDENT_AMBULATORY_CARE_PROVIDER_SITE_OTHER): Payer: Self-pay | Admitting: Nurse Practitioner

## 2018-10-27 NOTE — Telephone Encounter (Signed)
Spoke to Dr. Raul Del regarding the patient's plan of care/discussion the tumor conference.  Recommend follow-up as planned on September 25.  Will order CT at that time.

## 2018-10-28 ENCOUNTER — Other Ambulatory Visit: Payer: Self-pay

## 2018-10-28 ENCOUNTER — Ambulatory Visit (INDEPENDENT_AMBULATORY_CARE_PROVIDER_SITE_OTHER): Payer: Medicare Other | Admitting: Nurse Practitioner

## 2018-10-28 ENCOUNTER — Inpatient Hospital Stay: Admit: 2018-10-28 | Payer: Medicare Other

## 2018-10-28 ENCOUNTER — Encounter (INDEPENDENT_AMBULATORY_CARE_PROVIDER_SITE_OTHER): Payer: Self-pay | Admitting: Vascular Surgery

## 2018-10-28 VITALS — BP 94/61 | HR 80 | Resp 16 | Wt 124.0 lb

## 2018-10-28 DIAGNOSIS — E782 Mixed hyperlipidemia: Secondary | ICD-10-CM | POA: Diagnosis not present

## 2018-10-28 DIAGNOSIS — I714 Abdominal aortic aneurysm, without rupture, unspecified: Secondary | ICD-10-CM

## 2018-10-28 DIAGNOSIS — R911 Solitary pulmonary nodule: Secondary | ICD-10-CM

## 2018-10-30 ENCOUNTER — Telehealth (INDEPENDENT_AMBULATORY_CARE_PROVIDER_SITE_OTHER): Payer: Self-pay

## 2018-10-30 ENCOUNTER — Encounter (INDEPENDENT_AMBULATORY_CARE_PROVIDER_SITE_OTHER): Payer: Self-pay

## 2018-10-30 ENCOUNTER — Encounter (INDEPENDENT_AMBULATORY_CARE_PROVIDER_SITE_OTHER): Payer: Self-pay | Admitting: Nurse Practitioner

## 2018-10-30 NOTE — Telephone Encounter (Signed)
Spoke with the patient's wife he is now scheduled for a AAA repair with Dr. Lucky Cowboy on 11/19/2018. Patient will do his Covid testing and pre-op on 11/14/2018 @ 10:00 at the [facility]. Patient does have Pneumonia so he will need to be cleared by his PCP for that and have cardiac clearance from his Cardiologist as well before surgery. Pre-surgical instructions will be mailed to the patient.

## 2018-10-30 NOTE — Progress Notes (Signed)
SUBJECTIVE:  Patient ID: Joseph Peters, male    DOB: 1955/11/07, 63 y.o.   MRN: [id number] Chief Complaint  Patient presents with  . Follow-up    [facility] 1week follow up    HPI  Joseph Peters is a 63 y.o. male The patient has evidence of an inflamed abdominal aortic aneurysm nearly at 5 cm that was diagnosed during her recent inpatient stay.  The patient was scheduled to have his abdominal aortic aneurysm repaired as an outpatient, however he was not able to obtain an appointment with his cardiologist until 11/06/2018.  Therefore we had to reschedule surgery.  Also complicating his surgery was pneumonia which according to his PCP he still has.  He was most recently placed on another round of antibiotics hoping to clear up his pneumonia.  The patient also had numerous questions about the procedure.  He is states that it was making him anxious about certain aspects of the surgery as well as the possible debilitating act specks of it.  Otherwise, he denies any fever, chills, nausea, vomiting or diarrhea.  He denies any sharp shooting abdominal pain although he does have some gnawing aching pain which is consistent with some inflammation symptoms.  The patient is also being worked up for possible lung cancer due to a nodule that was found on recent scans.  Past Medical History:  Diagnosis Date  . AAA (abdominal aortic aneurysm) without rupture ([place])   . Abdominal aneurysm ([place]) 2015   being followed but not large enough to surgically treat  . Anxiety   . Arthritis   . Asthma   . Benign hypertension 07/16/2014  . Chest pain on exertion 07/16/2014  . Chronic pain syndrome   . Coccydynia 02/08/2012  . COPD (chronic obstructive pulmonary disease) ([place])    patient smokes  . Coronary artery disease   . 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 Depression   . 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 IBS (irritable bowel syndrome)   . Myocardial infarction ([place])  2010  . Restless leg syndrome   . Sciatica   . Spinal stenosis of lumbar region   . Stroke [facility]) 2015   no residual symptoms    Past Surgical History:  Procedure Laterality Date  . CARDIAC CATHETERIZATION  2012  . COLONOSCOPY WITH PROPOFOL N/A 11/14/2016   Procedure: COLONOSCOPY WITH PROPOFOL;  Surgeon: Manya Silvas, MD;  Location: [facility] ENDOSCOPY;  Service: Endoscopy;  Laterality: N/A;  . ESOPHAGOGASTRODUODENOSCOPY (EGD) WITH PROPOFOL N/A 11/14/2016   Procedure: ESOPHAGOGASTRODUODENOSCOPY (EGD) WITH PROPOFOL;  Surgeon: Manya Silvas, MD;  Location: [facility] ENDOSCOPY;  Service: Endoscopy;  Laterality: N/A;  . KNEE SURGERY     right  . SHOULDER ARTHROSCOPY WITH OPEN ROTATOR CUFF REPAIR Right 08/20/2017   Procedure: SHOULDER ARTHROSCOPY WITH OPEN ROTATOR CUFF REPAIR;  Surgeon: Corky Mull, MD;  Location: [facility] ORS;  Service: Orthopedics;  Laterality: Right;  . SHOULDER ARTHROSCOPY WITH SUBACROMIAL DECOMPRESSION, ROTATOR CUFF REPAIR AND BICEP TENDON REPAIR Left 05/10/2016   Procedure: SHOULDER ARTHROSCOPY WITH DEBTRIDEMENT, DECOMPRESSION, REPAIR OF MASSIVE ROTATOR CUFF TEAR AND BICEP TENODESIS;  Surgeon: Corky Mull, MD;  Location: [facility] ORS;  Service: Orthopedics;  Laterality: Left;  Massive cuff tear  . TOTAL HIP ARTHROPLASTY Right 07/01/2018   Procedure: TOTAL HIP ARTHROPLASTY - RIGHT;  Surgeon: Corky Mull, MD;  Location: [facility] ORS;  Service: Orthopedics;  Laterality: Right;    Social History   Socioeconomic History  .  Marital status: Married    Spouse name: Not on file  . Number of children: Not on file  . Years of education: Not on file  . Highest education level: Not on file  Occupational History  . Not on file  Social Needs  . Financial resource strain: Not on file  . Food insecurity    Worry: Not on file    Inability: Not on file  . Transportation needs    Medical: Not on file    Non-medical: Not on file  Tobacco Use  . Smoking status: Current Some Day Smoker     Packs/day: 0.50    Years: 30.00    Pack years: 15.00    Types: Cigarettes  . Smokeless tobacco: Former Systems developer    Quit date: 02/26/1998  Substance and Sexual Activity  . Alcohol use: Yes    Alcohol/week: 0.0 standard drinks    Comment: beer daily  . Drug use: No  . Sexual activity: Not on file  Lifestyle  . Physical activity    Days per week: Not on file    Minutes per session: Not on file  . Stress: Not on file  Relationships  . Social Herbalist on phone: Not on file    Gets together: Not on file    Attends religious service: Not on file    Active member of club or organization: Not on file    Attends meetings of clubs or organizations: Not on file    Relationship status: Not on file  . Intimate partner violence    Fear of current or ex partner: Not on file    Emotionally abused: Not on file    Physically abused: Not on file    Forced sexual activity: Not on file  Other Topics Concern  . Not on file  Social History Narrative   Patient is a Nature conservation officer.;  He lives with his wife at home.  Longstanding history of smoking; quit recently.  Denies alcohol abuse.    Family History  Problem Relation Age of Onset  . Cancer Mother   . COPD Father 54       black lung    Allergies  Allergen Reactions  . Penicillins Swelling, Rash and Other (See Comments)    Did it involve swelling of the face/tongue/throat, SOB, or low BP? yes Did it involve sudden or severe rash/hives, skin peeling, or any reaction on the inside of your mouth or nose? no Did you need to seek medical attention at a hospital or doctor's office? Yes When did it last happen?years  If all above answers are "NO", may proceed with cephalosporin use.    . Lidocaine Other (See Comments)    unsure     Review of Systems   Review of Systems: Negative Unless Checked Constitutional: [] Weight loss  [] Fever  [] Chills Cardiac: [] Chest pain   []  Atrial Fibrillation  [] Palpitations   [] Shortness  of breath when laying flat   [] Shortness of breath with exertion. [] Shortness of breath at rest Vascular:  [] Pain in legs with walking   [] Pain in legs with standing [] Pain in legs when laying flat   [] Claudication    [] Pain in feet when laying flat    [] History of DVT   [] Phlebitis   [] Swelling in legs   [] Varicose veins   [] Non-healing ulcers Pulmonary:   [] Uses home oxygen   [] Productive cough   [] Hemoptysis   [] Wheeze  [] COPD   [] Asthma Neurologic:  [] Dizziness   []   Seizures  [] Blackouts [] History of stroke   [] History of TIA  [] Aphasia   [] Temporary Blindness   [] Weakness or numbness in arm   [] Weakness or numbness in leg Musculoskeletal:   [] Joint swelling   [] Joint pain   [] Low back pain  []  History of Knee Replacement [] Arthritis [] back Surgeries  []  Spinal Stenosis    Hematologic:  [] Easy bruising  [] Easy bleeding   [] Hypercoagulable state   [] Anemic Gastrointestinal:  [] Diarrhea   [] Vomiting  [] Gastroesophageal reflux/heartburn   [] Difficulty swallowing. [] Abdominal pain Genitourinary:  [] Chronic kidney disease   [] Difficult urination  [] Anuric   [] Blood in urine [] Frequent urination  [] Burning with urination   [] Hematuria Skin:  [] Rashes   [] Ulcers [] Wounds Psychological:  [] History of anxiety   []  History of major depression  []  Memory Difficulties      OBJECTIVE:   Physical Exam  BP 94/61 (BP Location: Right Arm)   Pulse 80   Resp 16   Wt 124 lb (56.2 kg)   BMI 20.01 kg/m   Gen: WD/WN, NAD, frail-appearing Head: [place]/AT, No temporalis wasting.  Ear/Nose/Throat: Hearing grossly intact, nares w/o erythema or drainage Eyes: PER, EOMI, sclera nonicteric.  Neck: Supple, no masses.  No JVD.  Pulmonary:  Good air movement, no use of accessory muscles.  Cardiac: RRR Vascular:  Vessel Right Left  Radial Palpable Palpable  Dorsalis Pedis Palpable Palpable  Posterior Tibial Palpable Palpable   Gastrointestinal: soft, non-distended. No guarding/no peritoneal signs.   Musculoskeletal: M/S 5/5 throughout.  No deformity or atrophy.  Neurologic: Pain and light touch intact in extremities.  Symmetrical.  Speech is fluent. Motor exam as listed above. Psychiatric: Judgment intact, Mood & affect appropriate for pt's clinical situation. Dermatologic: No Venous rashes. No Ulcers Noted.  No changes consistent with cellulitis. Lymph : No Cervical lymphadenopathy, no lichenification or skin changes of chronic lymphedema.       ASSESSMENT AND PLAN:  1. Abdominal aortic aneurysm (AAA) without rupture [facility]) Had a long discussion with the patient and his wife as to the procedure for placing a stent graft as well as the expected follow-up schedule.  We also discussed about what he can expect following the surgery.  Proceeded to answer all questions from the patient and his wife.  Patient and wife are also somewhat anxious about possible rupture of the aneurysm.  I discussed the likelihood that rupture of the aneurysm at this size is small and we proceeded to discuss signs and symptoms of rupture and when emergency attention should be sought.  Discussed all risks, alternatives and benefits.  The patient still continues to agree with the surgery.  At this time we will tentatively place the patient out for 2 to 3 weeks to allow his pneumonia to be fully treated prior to surgery.  If it is found that he continues to have pneumonia we may again need to place out his surgery.  We discussed this with the patient and his wife and they understand and are in agreement.  2. Mixed hyperlipidemia Continue statin as ordered and reviewed, no changes at this time   3. Nodule of upper lobe of right lung In the midst of all of this the patient is also undergoing work-up for possible lung cancer.   Current Outpatient Medications on File Prior to Visit  Medication Sig Dispense Refill  . albuterol (PROVENTIL HFA;VENTOLIN HFA) 108 (90 Base) MCG/ACT inhaler Inhale 2 puffs into the lungs every 4  (four) hours as needed for wheezing or shortness of  breath.     Marland Kitchen aspirin EC 81 MG tablet Take 81 mg by mouth at bedtime.     Marland Kitchen DALIRESP 250 MCG TABS Take 250 mcg by mouth at bedtime. 30 tablet 0  . diazepam (VALIUM) 5 MG tablet Take 5 mg by mouth at bedtime as needed (restless legs).   1  . feeding supplement, ENSURE ENLIVE, (ENSURE ENLIVE) LIQD Take 237 mLs by mouth 3 (three) times daily between meals. 21330 mL 0  . finasteride (PROSCAR) 5 MG tablet Take 1 tablet (5 mg total) by mouth daily. 90 tablet 3  . FLUoxetine (PROZAC) 20 MG capsule Take 20 mg by mouth daily.     . Fluticasone-Umeclidin-Vilant (TRELEGY ELLIPTA) 100-62.5-25 MCG/INH AEPB Inhale 2 puffs into the lungs at bedtime.     . gabapentin (NEURONTIN) 300 MG capsule Take 900 mg by mouth at bedtime.     Marland Kitchen losartan (COZAAR) 25 MG tablet Take 25 mg by mouth daily.     . metoprolol tartrate (LOPRESSOR) 25 MG tablet Take 1 tablet (25 mg total) by mouth 2 (two) times daily. 60 tablet 0  . montelukast (SINGULAIR) 10 MG tablet Take 10 mg by mouth at bedtime.    . Multiple Vitamin (MULTIVITAMIN WITH MINERALS) TABS tablet Take 1 tablet by mouth daily. 30 tablet 0  . nicotine (NICODERM CQ - DOSED IN MG/24 HOURS) 21 mg/24hr patch Place 1 patch (21 mg total) onto the skin daily. 14 patch 0  . omeprazole (PRILOSEC) 20 MG capsule Take 20 mg by mouth daily.    . Oxycodone HCl 10 MG TABS Take 10 mg by mouth 4 (four) times daily as needed for pain.    . predniSONE (DELTASONE) 10 MG tablet Prednisone 40 mg p.o. daily x1. Then 30 mg p.o. daily x1 day Then 20 mg p.o. daily x1 day Then 10 mg p.o. daily x1 day 10 tablet 0  . simvastatin (ZOCOR) 40 MG tablet Take 40 mg by mouth at bedtime.     . tamsulosin (FLOMAX) 0.4 MG CAPS capsule Take 1 capsule (0.4 mg total) by mouth daily after supper. 90 capsule 1  . tiZANidine (ZANAFLEX) 4 MG tablet Take 4 mg by mouth 3 (three) times daily as needed for muscle spasms.     No current facility-administered  medications on file prior to visit.     There are no Patient Instructions on file for this visit. No follow-ups on file.   Kris Hartmann, NP  This note was completed with Sales executive.  Any errors are purely unintentional.

## 2018-10-31 ENCOUNTER — Other Ambulatory Visit: Admit: 2018-10-31 | Payer: Medicare Other

## 2018-11-13 ENCOUNTER — Telehealth (INDEPENDENT_AMBULATORY_CARE_PROVIDER_SITE_OTHER): Payer: Self-pay

## 2018-11-13 ENCOUNTER — Other Ambulatory Visit (INDEPENDENT_AMBULATORY_CARE_PROVIDER_SITE_OTHER): Payer: Self-pay | Admitting: Nurse Practitioner

## 2018-11-13 NOTE — Telephone Encounter (Signed)
Spoke with the patient's PCP,  the patient had an appt to be seen because of his diagnosis of pneumonia.and the patient missed his appt to clear him for surgery. Patient is scheduled for surgery with Dr. Lucky Cowboy on 11/19/2018 and his cardiac clearance is complete. Per his PCP they will try to get him to be seen before his surgery on 11/19/2018.

## 2018-11-14 ENCOUNTER — Other Ambulatory Visit: Payer: Medicare Other

## 2018-11-14 ENCOUNTER — Encounter
Admission: RE | Admit: 2018-11-14 | Discharge: 2018-11-14 | Disposition: A | Discharge status: Home / Self Care | Payer: Medicare Other | Source: Ambulatory Visit | Attending: Vascular Surgery | Admitting: Vascular Surgery

## 2018-11-14 ENCOUNTER — Other Ambulatory Visit: Payer: Self-pay

## 2018-11-14 DIAGNOSIS — Z20828 Contact with and (suspected) exposure to other viral communicable diseases: Secondary | ICD-10-CM | POA: Diagnosis not present

## 2018-11-14 DIAGNOSIS — Z01812 Encounter for preprocedural laboratory examination: Secondary | ICD-10-CM | POA: Insufficient documentation

## 2018-11-14 DIAGNOSIS — I714 Abdominal aortic aneurysm, without rupture: Secondary | ICD-10-CM | POA: Insufficient documentation

## 2018-11-14 HISTORY — DX: Polyneuropathy, unspecified: G62.9

## 2018-11-14 HISTORY — DX: Repeated falls: R29.6

## 2018-11-14 HISTORY — DX: Gastro-esophageal reflux disease without esophagitis: K21.9

## 2018-11-14 HISTORY — DX: Dyspnea, unspecified: R06.00

## 2018-11-14 LAB — BASIC METABOLIC PANEL
Anion gap: 12 (ref 5–15)
BUN: 8 mg/dL (ref 8–23)
CO2: 24 mmol/L (ref 22–32)
Calcium: 8.8 mg/dL — ABNORMAL LOW (ref 8.9–10.3)
Chloride: 96 mmol/L — ABNORMAL LOW (ref 98–111)
Creatinine, Ser: 1.1 mg/dL (ref 0.61–1.24)
GFR calc Af Amer: >60 mL/min (ref >60)
GFR calc non Af Amer: >60 mL/min (ref >60)
Glucose, Bld: 78 mg/dL (ref 70–99)
Potassium: 3.9 mmol/L (ref 3.5–5.1)
Sodium: 132 mmol/L — ABNORMAL LOW (ref 135–145)

## 2018-11-14 LAB — CBC WITH DIFFERENTIAL/PLATELET
Abs Immature Granulocytes: 0.06 10*3/uL (ref 0.00–0.07)
Basophils Absolute: 0 10*3/uL (ref 0.0–0.1)
Basophils Relative: 0 %
Eosinophils Absolute: 0 10*3/uL (ref 0.0–0.5)
Eosinophils Relative: 0 %
HCT: 38 % — ABNORMAL LOW (ref 39.0–52.0)
Hemoglobin: 12.8 g/dL — ABNORMAL LOW (ref 13.0–17.0)
Immature Granulocytes: 1 %
Lymphocytes Relative: 9 %
Lymphs Abs: 1 10*3/uL (ref 0.7–4.0)
MCH: 30 pg (ref 26.0–34.0)
MCHC: 33.7 g/dL (ref 30.0–36.0)
MCV: 89.2 fL (ref 80.0–100.0)
Monocytes Absolute: 1.3 10*3/uL — ABNORMAL HIGH (ref 0.1–1.0)
Monocytes Relative: 11 %
Neutro Abs: 8.7 10*3/uL — ABNORMAL HIGH (ref 1.7–7.7)
Neutrophils Relative %: 79 %
Platelets: 421 10*3/uL — ABNORMAL HIGH (ref 150–400)
RBC: 4.26 MIL/uL (ref 4.22–5.81)
RDW: 12.7 % (ref 11.5–15.5)
WBC: 11.1 10*3/uL — ABNORMAL HIGH (ref 4.0–10.5)
nRBC: 0 % (ref 0.0–0.2)

## 2018-11-14 LAB — TYPE AND SCREEN
ABO/RH(D): A POS
Antibody Screen: NEGATIVE

## 2018-11-14 LAB — APTT: aPTT: 28 seconds (ref 24–36)

## 2018-11-14 LAB — SURGICAL PCR SCREEN
MRSA, PCR: NEGATIVE
Staphylococcus aureus: NEGATIVE

## 2018-11-14 LAB — SARS CORONAVIRUS 2 (TAT 6-24 HRS): SARS Coronavirus 2: NEGATIVE

## 2018-11-14 LAB — PROTIME-INR
INR: 1.1 (ref 0.8–1.2)
Prothrombin Time: 14 seconds (ref 11.4–15.2)

## 2018-11-14 NOTE — Pre-Procedure Instructions (Addendum)
Received call fro Dr Pricilla Riffle' office.  "The doctor will not provide clearance, patient needs to receive clearance for Dr Raul Del."

## 2018-11-14 NOTE — Pre-Procedure Instructions (Signed)
Called Dr Randa Lynn regarding patient's medical history. Medical clearance requested. Called and faxed Dr Pricilla Riffle office regarding need for clearance.

## 2018-11-14 NOTE — Pre-Procedure Instructions (Signed)
Called and faxed  Dr Gust Brooms office for clearance.

## 2018-11-14 NOTE — Patient Instructions (Signed)
Your procedure is scheduled on: 11/19/2018 Wed Report to Same Day Surgery 2nd floor medical mall [facility] Entrance-take elevator on left to 2nd floor.  Check in with surgery information desk.) To find out your arrival time please call [phone] between 1PM - 3PM on 11/18/2018 Tues  Remember: Instructions that are not followed completely may result in serious medical risk, up to and including death, or upon the discretion of your surgeon and anesthesiologist your surgery may need to be rescheduled.    _x___ 1. Do not eat food after midnight the night before your procedure. You may drink clear liquids up to 2 hours before you are scheduled to arrive at the hospital for your procedure.  Do not drink clear liquids within 2 hours of your scheduled arrival to the hospital.  Clear liquids include  --Water or Apple juice without pulp  --Clear carbohydrate beverage such as ClearFast or Gatorade  --Black Coffee or Clear Tea (No milk, no creamers, do not add anything to                  the coffee or Tea Type 1 and type 2 diabetics should only drink water.   ____Ensure clear carbohydrate drink on the way to the hospital for bariatric patients  ____Ensure clear carbohydrate drink 3 hours before surgery.   No gum chewing or hard candies.     __x__ 2. No Alcohol for 24 hours before or after surgery.   __x__3. No Smoking or e-cigarettes for 24 prior to surgery.  Do not use any chewable tobacco products for at least 6 hour prior to surgery   ____  4. Bring all medications with you on the day of surgery if instructed.    __x__ 5. Notify your doctor if there is any change in your medical condition     (cold, fever, infections).    x___6. On the morning of surgery brush your teeth with toothpaste and water.  You may rinse your mouth with mouth wash if you wish.  Do not swallow any toothpaste or mouthwash.   Do not wear jewelry, make-up, hairpins, clips or nail polish.  Do not wear lotions,  powders, or perfumes. You may wear deodorant.  Do not shave 48 hours prior to surgery. Men may shave face and neck.  Do not bring valuables to the hospital.    [facility] is not responsible for any belongings or valuables.               Contacts, dentures or bridgework may not be worn into surgery.  Leave your suitcase in the car. After surgery it may be brought to your room.  For patients admitted to the hospital, discharge time is determined by your                       treatment team.  _  Patients discharged the day of surgery will not be allowed to drive home.  You will need someone to drive you home and stay with you the night of your procedure.    Please read over the following fact sheets that you were given:   [facility] Preparing for Surgery and or MRSA Information   _x___ Take anti-hypertensive listed below, cardiac, seizure, asthma,     anti-reflux and psychiatric medicines. These include:  1. albuterol (PROVENTIL HFA;VENTOLIN HFA) 108 (90 Base) MCG/ACT inhaler  2.finasteride (PROSCAR) 5 MG tablet  3.FLUoxetine (PROZAC) 20 MG capsule  4.metoprolol tartrate (LOPRESSOR) 25  MG tablet   5.omeprazole (PRILOSEC) 20 MG capsule  6.  ____Fleets enema or Magnesium Citrate as directed.   _x___ Use CHG Soap or sage wipes as directed on instruction sheet   _x___ Use inhalers on the day of surgery and bring to hospital day of surgery  ____ Stop Metformin and Janumet 2 days prior to surgery.    ____ Take 1/2 of usual insulin dose the night before surgery and none on the morning     surgery.   _x___ Follow recommendations from Cardiologist, Pulmonologist or PCP regarding          stopping Aspirin, Coumadin, Plavix ,Eliquis, Effient, or Pradaxa, and Pletal.  X____Stop Anti-inflammatories such as Advil, Aleve, Ibuprofen, Motrin, Naproxen, Naprosyn, Goodies powders or aspirin products. OK to take Tylenol and                          Celebrex.   _x___ Stop supplements until after  surgery.  But may continue Vitamin D, Vitamin B,       and multivitamin.   ____ Bring C-Pap to the hospital.

## 2018-11-18 NOTE — Telephone Encounter (Signed)
I received information from the patient's pulmonologist that he is not cleared to have his surgery from their standpoint. Patient has been canceled. A message was left for the patient to return my call so this can be explained.

## 2018-11-19 ENCOUNTER — Encounter: Admission: RE | Payer: Self-pay | Source: Home / Self Care

## 2018-11-19 ENCOUNTER — Inpatient Hospital Stay: Admission: RE | Admit: 2018-11-19 | Payer: Medicare Other | Source: Home / Self Care | Admitting: Vascular Surgery

## 2018-11-19 SURGERY — INSERTION, ENDOVASCULAR STENT GRAFT, AORTA, ABDOMINAL
Anesthesia: General

## 2018-11-21 ENCOUNTER — Inpatient Hospital Stay: Payer: Medicare Other | Attending: Internal Medicine | Admitting: Internal Medicine

## 2018-12-02 ENCOUNTER — Telehealth: Payer: Self-pay | Admitting: Licensed Clinical Social Worker

## 2018-12-02 NOTE — Telephone Encounter (Signed)
Received referral from [facility] by  Dr. Pricilla Riffle to see patient for MAC. I called and left a message for patient to call back so I can give him an appointment.

## 2018-12-22 ENCOUNTER — Other Ambulatory Visit: Payer: Self-pay

## 2018-12-22 DIAGNOSIS — N401 Enlarged prostate with lower urinary tract symptoms: Secondary | ICD-10-CM

## 2018-12-22 MED ORDER — TAMSULOSIN HCL 0.4 MG PO CAPS: 0.4000 mg | ORAL_CAPSULE | Freq: Every day | ORAL | 1 refills | Status: DC

## 2018-12-23 ENCOUNTER — Emergency Department
Admission: EM | Admit: 2018-12-23 | Discharge: 2018-12-23 | Disposition: A | Discharge status: Home / Self Care | Payer: Medicare Other | Attending: Emergency Medicine | Admitting: Emergency Medicine

## 2018-12-23 ENCOUNTER — Encounter: Payer: Self-pay | Admitting: Emergency Medicine

## 2018-12-23 ENCOUNTER — Emergency Department: Payer: Medicare Other

## 2018-12-23 ENCOUNTER — Other Ambulatory Visit (INDEPENDENT_AMBULATORY_CARE_PROVIDER_SITE_OTHER): Payer: Self-pay | Admitting: Nurse Practitioner

## 2018-12-23 ENCOUNTER — Other Ambulatory Visit: Payer: Self-pay

## 2018-12-23 ENCOUNTER — Telehealth: Payer: Self-pay | Admitting: Infectious Diseases

## 2018-12-23 DIAGNOSIS — R042 Hemoptysis: Secondary | ICD-10-CM | POA: Diagnosis present

## 2018-12-23 DIAGNOSIS — Z5321 Procedure and treatment not carried out due to patient leaving prior to being seen by health care provider: Secondary | ICD-10-CM | POA: Insufficient documentation

## 2018-12-23 LAB — CBC
HCT: 36.7 % — ABNORMAL LOW (ref 39.0–52.0)
Hemoglobin: 12.1 g/dL — ABNORMAL LOW (ref 13.0–17.0)
MCH: 29.3 pg (ref 26.0–34.0)
MCHC: 33 g/dL (ref 30.0–36.0)
MCV: 88.9 fL (ref 80.0–100.0)
Platelets: 346 10*3/uL (ref 150–400)
RBC: 4.13 MIL/uL — ABNORMAL LOW (ref 4.22–5.81)
RDW: 14.5 % (ref 11.5–15.5)
WBC: 9.9 10*3/uL (ref 4.0–10.5)
nRBC: 0 % (ref 0.0–0.2)

## 2018-12-23 LAB — BASIC METABOLIC PANEL
Anion gap: 12 (ref 5–15)
BUN: 12 mg/dL (ref 8–23)
CO2: 24 mmol/L (ref 22–32)
Calcium: 9.3 mg/dL (ref 8.9–10.3)
Chloride: 100 mmol/L (ref 98–111)
Creatinine, Ser: 1.06 mg/dL (ref 0.61–1.24)
GFR calc Af Amer: >60 mL/min (ref >60)
GFR calc non Af Amer: >60 mL/min (ref >60)
Glucose, Bld: 110 mg/dL — ABNORMAL HIGH (ref 70–99)
Potassium: 4.2 mmol/L (ref 3.5–5.1)
Sodium: 136 mmol/L (ref 135–145)

## 2018-12-23 NOTE — ED Triage Notes (Signed)
Pt here with c/o coughing up blood this am with his sputum, has been treated for pneumonia for the past few weeks, just finished antibiotics. Is due to have surgery for abdominal aneurysm when lungs clear up. Has been covid tested a few times this month and resulted negative. Talking in complete sentences, no SHOB noted.

## 2018-12-23 NOTE — ED Notes (Signed)
No answer when called for room 

## 2018-12-23 NOTE — Telephone Encounter (Signed)
Referral from [facility]  Left message 12/18/2018

## 2018-12-23 NOTE — ED Notes (Signed)
No answer when called 

## 2018-12-24 ENCOUNTER — Other Ambulatory Visit: Payer: Self-pay

## 2018-12-24 ENCOUNTER — Emergency Department: Payer: Medicare Other

## 2018-12-24 ENCOUNTER — Emergency Department
Admission: EM | Admit: 2018-12-24 | Discharge: 2018-12-24 | Disposition: A | Discharge status: Home / Self Care | Payer: Medicare Other | Attending: Emergency Medicine | Admitting: Emergency Medicine

## 2018-12-24 DIAGNOSIS — Z7982 Long term (current) use of aspirin: Secondary | ICD-10-CM | POA: Diagnosis not present

## 2018-12-24 DIAGNOSIS — J449 Chronic obstructive pulmonary disease, unspecified: Secondary | ICD-10-CM | POA: Diagnosis not present

## 2018-12-24 DIAGNOSIS — Z87891 Personal history of nicotine dependence: Secondary | ICD-10-CM | POA: Diagnosis not present

## 2018-12-24 DIAGNOSIS — Z8673 Personal history of transient ischemic attack (TIA), and cerebral infarction without residual deficits: Secondary | ICD-10-CM | POA: Diagnosis not present

## 2018-12-24 DIAGNOSIS — J45909 Unspecified asthma, uncomplicated: Secondary | ICD-10-CM | POA: Insufficient documentation

## 2018-12-24 DIAGNOSIS — Z79899 Other long term (current) drug therapy: Secondary | ICD-10-CM | POA: Insufficient documentation

## 2018-12-24 DIAGNOSIS — I252 Old myocardial infarction: Secondary | ICD-10-CM | POA: Diagnosis not present

## 2018-12-24 DIAGNOSIS — I2510 Atherosclerotic heart disease of native coronary artery without angina pectoris: Secondary | ICD-10-CM | POA: Diagnosis not present

## 2018-12-24 DIAGNOSIS — R042 Hemoptysis: Secondary | ICD-10-CM | POA: Insufficient documentation

## 2018-12-24 DIAGNOSIS — I10 Essential (primary) hypertension: Secondary | ICD-10-CM | POA: Diagnosis not present

## 2018-12-24 LAB — CBC
HCT: 38 % — ABNORMAL LOW (ref 39.0–52.0)
Hemoglobin: 12.6 g/dL — ABNORMAL LOW (ref 13.0–17.0)
MCH: 29.6 pg (ref 26.0–34.0)
MCHC: 33.2 g/dL (ref 30.0–36.0)
MCV: 89.2 fL (ref 80.0–100.0)
Platelets: 360 10*3/uL (ref 150–400)
RBC: 4.26 MIL/uL (ref 4.22–5.81)
RDW: 14.6 % (ref 11.5–15.5)
WBC: 10 10*3/uL (ref 4.0–10.5)
nRBC: 0 % (ref 0.0–0.2)

## 2018-12-24 LAB — TROPONIN I (HIGH SENSITIVITY)
Troponin I (High Sensitivity): 9 ng/L (ref <18)
Troponin I (High Sensitivity): 7 ng/L (ref <18)

## 2018-12-24 LAB — BASIC METABOLIC PANEL
Anion gap: 12 (ref 5–15)
BUN: 9 mg/dL (ref 8–23)
CO2: 23 mmol/L (ref 22–32)
Calcium: 8.8 mg/dL — ABNORMAL LOW (ref 8.9–10.3)
Chloride: 97 mmol/L — ABNORMAL LOW (ref 98–111)
Creatinine, Ser: 0.9 mg/dL (ref 0.61–1.24)
GFR calc Af Amer: >60 mL/min (ref >60)
GFR calc non Af Amer: >60 mL/min (ref >60)
Glucose, Bld: 96 mg/dL (ref 70–99)
Potassium: 3.7 mmol/L (ref 3.5–5.1)
Sodium: 132 mmol/L — ABNORMAL LOW (ref 135–145)

## 2018-12-24 NOTE — ED Provider Notes (Signed)
[facility] Emergency Department Provider Note ____________________________________________   First MD Initiated Contact with Patient 12/24/18 1905     (approximate)  I have reviewed the triage vital signs and the nursing notes.   HISTORY  Chief Complaint Hemoptysis    HPI Joseph Peters is a 63 y.o. male with PMH as noted below including AAA, COPD, pneumonia, and recent diagnosis of MAI who presents with hemoptysis.  The patient states that it has been present for approximately last 2 weeks, and is typically small strings of blood mixed in with the sputum although it has been slightly more over the last few days.  He also reports left-sided chest pain radiating to his back that has been present for the last 2 weeks at least.  The patient denies fever or any increase in shortness of breath.  He states he has been on multiple antibiotic courses.  He called his pulmonologist about the hemoptysis and was told to come to the ED for evaluation.  Past Medical History:  Diagnosis Date  . AAA (abdominal aortic aneurysm) without rupture ([place])   . Abdominal aneurysm ([place]) 2015   being followed but not large enough to surgically treat  . Anxiety   . Arthritis   . Asthma   . Benign hypertension 07/16/2014  . Chest pain on exertion 07/16/2014  . Chronic pain syndrome   . Coccydynia 02/08/2012  . COPD (chronic obstructive pulmonary disease) ([place])    patient smokes  . Coronary artery disease   . 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 Depression   . 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 Dyspnea   . Frequent falls   . GERD (gastroesophageal reflux disease)   . IBS (irritable bowel syndrome)   . Myocardial infarction ([place]) 2010  . Neuropathy   . Restless leg syndrome   . Sciatica   . Spinal stenosis of lumbar region   . Stroke [facility]) 2015   no residual symptoms    Patient Active Problem List   Diagnosis Date Noted  .  Protein-calorie malnutrition, severe 10/15/2018  . Nodule of upper lobe of right lung 10/15/2018  . RLQ abdominal pain 10/14/2018  . Status post total hip replacement, right 07/01/2018  . Strain of right knee 06/26/2018  . Lumbar strain 06/16/2018  . Strain of right hip 06/16/2018  . Hyperlipidemia 01/30/2018  . Rotator cuff tendinitis, right 08/19/2017  . Traumatic complete tear of right rotator cuff 08/19/2017  . Ascending aortic aneurysm ([place]) 08/01/2017  . Tremor, essential 05/28/2017  . Gastroesophageal reflux disease without esophagitis 12/25/2016  . Renal artery stenosis ([place]) 12/05/2015  . 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 Syncope and collapse 12/05/2015  . Essential hypertension 10/15/2015  . COPD exacerbation ([place]) 01/31/2015  . Acute respiratory failure ([place]) 01/31/2015  . Drug allergy 01/30/2015  . AAA (abdominal aortic aneurysm) ([place]) 11/23/2014  . Prostate cancer screening 11/02/2014  . Gross hematuria 11/02/2014  . Chest pain on exertion 07/16/2014  . Benign hypertension 07/16/2014  . 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 Occlusion and stenosis of unspecified carotid artery 05/14/2012  . Coccydynia 02/08/2012  . 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 Gait disorder 02/08/2012  . Spinal stenosis of lumbar region 01/13/2009  . Atherosclerotic heart disease of native coronary artery without angina pectoris 06/28/2008  . Sciatica 06/28/2008  . Recurrent major depressive disorder in partial remission ([place]) 06/28/2008  Past Surgical History:  Procedure Laterality Date  . CARDIAC CATHETERIZATION  2012  . COLONOSCOPY WITH PROPOFOL N/A 11/14/2016   Procedure: COLONOSCOPY WITH PROPOFOL;  Surgeon: Manya Silvas, MD;  Location: [facility] ENDOSCOPY;  Service: Endoscopy;  Laterality: N/A;  . ESOPHAGOGASTRODUODENOSCOPY (EGD) WITH PROPOFOL N/A 11/14/2016   Procedure: ESOPHAGOGASTRODUODENOSCOPY (EGD) WITH  PROPOFOL;  Surgeon: Manya Silvas, MD;  Location: [facility] ENDOSCOPY;  Service: Endoscopy;  Laterality: N/A;  . KNEE SURGERY     right  . SHOULDER ARTHROSCOPY WITH OPEN ROTATOR CUFF REPAIR Right 08/20/2017   Procedure: SHOULDER ARTHROSCOPY WITH OPEN ROTATOR CUFF REPAIR;  Surgeon: Corky Mull, MD;  Location: [facility] ORS;  Service: Orthopedics;  Laterality: Right;  . SHOULDER ARTHROSCOPY WITH SUBACROMIAL DECOMPRESSION, ROTATOR CUFF REPAIR AND BICEP TENDON REPAIR Left 05/10/2016   Procedure: SHOULDER ARTHROSCOPY WITH DEBTRIDEMENT, DECOMPRESSION, REPAIR OF MASSIVE ROTATOR CUFF TEAR AND BICEP TENODESIS;  Surgeon: Corky Mull, MD;  Location: [facility] ORS;  Service: Orthopedics;  Laterality: Left;  Massive cuff tear  . TOTAL HIP ARTHROPLASTY Right 07/01/2018   Procedure: TOTAL HIP ARTHROPLASTY - RIGHT;  Surgeon: Corky Mull, MD;  Location: [facility] ORS;  Service: Orthopedics;  Laterality: Right;    Prior to Admission medications   Medication Sig Start Date End Date Taking? Authorizing Provider  albuterol (PROVENTIL HFA;VENTOLIN HFA) 108 (90 Base) MCG/ACT inhaler Inhale 2 puffs into the lungs every 4 (four) hours as needed for wheezing or shortness of breath.     [provider]  aspirin EC 81 MG tablet Take 81 mg by mouth at bedtime.     [provider]  DALIRESP 250 MCG TABS Take 250 mcg by mouth at bedtime. 10/17/18   Stark Jock Jude, MD  diazepam (VALIUM) 5 MG tablet Take 5 mg by mouth at bedtime as needed (restless legs).  11/15/16   [provider]  finasteride (PROSCAR) 5 MG tablet Take 1 tablet (5 mg total) by mouth daily. 03/06/18   Festus Aloe, MD  FLUoxetine (PROZAC) 20 MG capsule Take 20 mg by mouth daily.     [provider]  Fluticasone-Umeclidin-Vilant (TRELEGY ELLIPTA) 100-62.5-25 MCG/INH AEPB Inhale 2 puffs into the lungs at bedtime.  08/18/18   [provider]  gabapentin (NEURONTIN) 300 MG capsule Take 900 mg by mouth at bedtime.     [provider]  losartan (COZAAR) 25 MG tablet Take 25 mg by mouth daily.     [provider]  metoprolol tartrate (LOPRESSOR) 25 MG tablet Take 1 tablet (25 mg total) by mouth 2 (two) times daily. 10/17/18   Stark Jock, Jude, MD  montelukast (SINGULAIR) 10 MG tablet Take 10 mg by mouth at bedtime.    [provider]  Multiple Vitamin (MULTIVITAMIN WITH MINERALS) TABS tablet Take 1 tablet by mouth daily. 10/18/18   Stark Jock, Jude, MD  nicotine (NICODERM CQ - DOSED IN MG/24 HOURS) 21 mg/24hr patch Place 1 patch (21 mg total) onto the skin daily. 10/17/18   Stark Jock Jude, MD  omeprazole (PRILOSEC) 20 MG capsule Take 20 mg by mouth daily.    [provider]  Oxycodone HCl 10 MG TABS Take 10 mg by mouth 4 (four) times daily as needed for pain. 09/29/18   [provider]  predniSONE (DELTASONE) 10 MG tablet Prednisone 40 mg p.o. daily x1. Then 30 mg p.o. daily x1 day Then 20 mg p.o. daily x1 day Then 10 mg p.o. daily x1 day Patient not taking: Reported on 11/14/2018 10/17/18   Stark Jock, Jude,  MD  simvastatin (ZOCOR) 40 MG tablet Take 40 mg by mouth at bedtime.     [provider]  tamsulosin (FLOMAX) 0.4 MG CAPS capsule Take 1 capsule (0.4 mg total) by mouth daily after supper. 12/22/18   Festus Aloe, MD  tiZANidine (ZANAFLEX) 4 MG tablet Take 4 mg by mouth 3 (three) times daily as needed for muscle spasms. 10/10/18   [provider]    Allergies Penicillins and Lidocaine  Family History  Problem Relation Age of Onset  . Cancer Mother   . COPD Father 20       black lung    Social History Social History   Tobacco Use  . Smoking status: Former Smoker    Packs/day: 0.50    Years: 30.00    Pack years: 15.00    Types: Cigarettes    Quit date: 09/13/2018    Years since quitting: 0.2  . Smokeless tobacco: Former Systems developer    Quit date: 02/26/1998  Substance Use Topics  . Alcohol use: Yes    Alcohol/week: 20.0 standard drinks    Types: 20 Cans of beer per week     Comment: beer daily  . Drug use: No    Review of Systems  Constitutional: No fever. Eyes: No redness. ENT: No sore throat. Cardiovascular: Positive for chest pain. Respiratory: Denies acute shortness of breath. Gastrointestinal: No vomiting or diarrhea.  Genitourinary: Negative for flank pain.  Musculoskeletal: Negative for back pain. Skin: Negative for rash. Neurological: Negative for headache.   ____________________________________________   PHYSICAL EXAM:  VITAL SIGNS: ED Triage Vitals [12/24/18 1607]  Enc Vitals Group     BP (!) 149/79     Pulse Rate (!) 109     Resp 18     Temp 97.9 F (36.6 C)     Temp Source Oral     SpO2 98 %     Weight 110 lb 3.7 oz (50 kg)     Height 5\' 6"  (1.676 m)     Head Circumference      Peak Flow      Pain Score 8     Pain Loc      Pain Edu?      Excl. in [place]?     Constitutional: Alert and oriented.  Relatively well appearing and in no acute distress. Eyes: Conjunctivae are normal.  Head: Atraumatic. Nose: No congestion/rhinnorhea. Mouth/Throat: Mucous membranes are moist.   Neck: Normal range of motion.  Cardiovascular: Normal rate, regular rhythm. Grossly normal heart sounds.  Good peripheral circulation. Respiratory: Normal respiratory effort.  No retractions. Lungs CTAB. Gastrointestinal: No distention.  Musculoskeletal: No lower extremity edema.  Extremities warm and well perfused.  Neurologic:  Normal speech and language. No gross focal neurologic deficits are appreciated.  Skin:  Skin is warm and dry. No rash noted. Psychiatric: Mood and affect are normal. Speech and behavior are normal.  ____________________________________________   LABS (all labs ordered are listed, but only abnormal results are displayed)  Labs Reviewed  BASIC METABOLIC PANEL - Abnormal; Notable for the following components:      Result Value   Sodium 132 (*)    Chloride 97 (*)    Calcium 8.8 (*)    All other components within normal  limits  CBC - Abnormal; Notable for the following components:   Hemoglobin 12.6 (*)    HCT 38.0 (*)    All other components within normal limits  TROPONIN I (HIGH SENSITIVITY)  TROPONIN I (HIGH SENSITIVITY)  ____________________________________________  EKG  ED ECG REPORT I, Arta Silence, the attending physician, personally viewed and interpreted this ECG.  Date: 12/24/2018 EKG Time: 1610 Rate: 95 Rhythm: normal sinus rhythm QRS Axis: normal Intervals: normal ST/T Wave abnormalities: normal Narrative Interpretation: no evidence of acute ischemia; no significant change when compared to EKG of 10/14/2018  ____________________________________________  RADIOLOGY  CT chest: Right and left apical opacities with necrotic areas, suggestive of likely adenocarcinoma versus less likely pneumonia.  ____________________________________________   PROCEDURES  Procedure(s) performed: No  Procedures  Critical Care performed: No ____________________________________________   INITIAL IMPRESSION / ASSESSMENT AND PLAN / ED COURSE  Pertinent labs & imaging results that were available during my care of the patient were reviewed by me and considered in my medical decision making (see chart for details).  63 year old male with PMH as noted above presents with hemoptysis over the last 2 weeks, which she states has somewhat worsened in the last several days although he describes a small volume.  He also has persistent left-sided chest pain over the last 2 weeks which is unchanged.  I extensively reviewed the past medical records in [place].  The patient is followed by Dr. Raul Del from pulmonology.  He has been treated for pneumonia over the last few months with multiple courses of antibiotics.  He has an aortic aneurysm which is to be treated with surgery, but this is being held as he is awaiting treatment for his pneumonia.  Per the notes, it appears that the patient  most recently completed a course of clindamycin.  His sputum is positive for MAI.  He was to be referred to infectious disease.  He also has a PET positive right upper lobe nodule for which he is to follow-up with oncology.  The patient came to the ED yesterday but left before being seen.  He had a chest x-ray showing right upper lobe opacity, suspicious for pneumonia superimposed on chronic pleural thickening.  He had a CT angiogram of the chest in August showing no PE.  On exam today, the patient is relatively comfortable appearing.  His vital signs are normal.  He has no active cough or hemoptysis during my evaluation.  The lungs are clear to auscultation bilaterally.  The remainder of the physical exam is unremarkable.  Overall I suspect that the hemoptysis is due to the patient's ongoing chronic lung issue, specifically the possibility of a malignancy, versus possible persistent pneumonia/MAI.  There is no evidence of cardiac cause, and I have a low suspicion for pulmonary embolism given the lack of tachycardia or hypoxia and the persistent symptoms over the last several weeks without acute change.  Given the chest x-ray findings yesterday, I will proceed with a CT chest to further evaluate for mass versus pneumonia.  ----------------------------------------- 10:26 PM on 12/24/2018 -----------------------------------------  The CT shows right and left apical lesions with some areas of necrosis, most concerning for adenocarcinoma.  Per radiology, this could also be pneumonia although less likely given that the patient has completed antibiotics.  The lab work-up is unremarkable.  The patient has no fever, elevated WBC count, or other symptoms to suggest infection.  The patient is being followed by Dr. Raul Del from pulmonology, Dr. Rogue Bussing from oncology, and has been referred to Dr. Ola Spurr from [place].  There is no one on call for [facility] pulmonology at this hour.  At this time, given that he  has no active hemoptysis, no significant respiratory symptoms, and is overall clinically stable, the  patient does not require admission or further ED monitoring.  He feels comfortable going home.  However, he will need close follow-up.  I sent a secure message to all 3 physicians, and also discussed the results of the work-up and the plan with the patient's wife over the phone, as she has been managing his appointments.  I gave both the patient and his wife the return precautions, and expressed understanding.  ____________________________________________   FINAL CLINICAL IMPRESSION(S) / ED DIAGNOSES  Final diagnoses:  Hemoptysis      NEW MEDICATIONS STARTED DURING THIS VISIT:  Discharge Medication List as of 12/24/2018  9:51 PM       Note:  This document was prepared using Dragon voice recognition software and may include unintentional dictation errors.    Arta Silence, MD 12/24/18 2228

## 2018-12-24 NOTE — Discharge Instructions (Addendum)
Follow-up with Dr. Rogue Bussing as well as with Dr. Ola Spurr.  We have informed them that you were in the hospital tonight and about the CAT scan results.  Return to the ER immediately if you have new or worsening chest pain, difficulty breathing, coughing up more blood, high fevers, weakness, or any other new or worsening symptoms that concern you.

## 2018-12-24 NOTE — ED Notes (Signed)
Pt transported to CT ?

## 2018-12-24 NOTE — ED Triage Notes (Addendum)
Reports that he is here today for continued "spitting up little strings of blood" today. Reports chest pain through left side of chest and into back per patient, worse with cough. Pt finished his course of ABX X 1 week ago. Pt was in ER yesterday and left without being seen. Pt states that nothing has changed except that he is wants to see a doctor.

## 2018-12-30 ENCOUNTER — Telehealth (INDEPENDENT_AMBULATORY_CARE_PROVIDER_SITE_OTHER): Payer: Self-pay | Admitting: Vascular Surgery

## 2019-01-01 ENCOUNTER — Ambulatory Visit (INDEPENDENT_AMBULATORY_CARE_PROVIDER_SITE_OTHER): Payer: Medicare Other | Admitting: Vascular Surgery

## 2019-01-01 ENCOUNTER — Other Ambulatory Visit: Payer: Self-pay

## 2019-01-01 VITALS — BP 156/93 | HR 88 | Resp 16 | Wt 116.0 lb

## 2019-01-01 DIAGNOSIS — I2510 Atherosclerotic heart disease of native coronary artery without angina pectoris: Secondary | ICD-10-CM

## 2019-01-01 DIAGNOSIS — I10 Essential (primary) hypertension: Secondary | ICD-10-CM | POA: Diagnosis not present

## 2019-01-01 DIAGNOSIS — J441 Chronic obstructive pulmonary disease with (acute) exacerbation: Secondary | ICD-10-CM

## 2019-01-01 DIAGNOSIS — I714 Abdominal aortic aneurysm, without rupture, unspecified: Secondary | ICD-10-CM

## 2019-01-01 DIAGNOSIS — K219 Gastro-esophageal reflux disease without esophagitis: Secondary | ICD-10-CM

## 2019-01-01 NOTE — Progress Notes (Signed)
MRN : [id number]  Joseph Peters is a 63 y.o. (1955-04-04) male who presents with chief complaint of  Chief Complaint  Patient presents with  . Follow-up  .  History of Present Illness:   The patient returns to the office for surveillance of a known abdominal aortic aneurysm. Patient denies abdominal pain or back pain, no other abdominal complaints. No changes suggesting embolic episodes.   There have been  interval changes in the patient's overall health care since his last visit.  His pulmonary workup has revealed probable lung cancer with a post obstructive pneumonia.  This has delayed clearance for endovascular repair.  Patient denies amaurosis fugax or TIA symptoms. There is no history of claudication or rest pain symptoms of the lower extremities. The patient denies angina or shortness of breath.   Duplex US of the aorta and iliac arteries shows an AAA measured 5 cm.  Current Meds  Medication Sig  . albuterol (PROVENTIL HFA;VENTOLIN HFA) 108 (90 Base) MCG/ACT inhaler Inhale 2 puffs into the lungs every 4 (four) hours as needed for wheezing or shortness of breath.   Marland Kitchen aspirin EC 81 MG tablet Take 81 mg by mouth at bedtime.   Marland Kitchen DALIRESP 250 MCG TABS Take 250 mcg by mouth at bedtime.  . diazepam (VALIUM) 5 MG tablet Take 5 mg by mouth at bedtime as needed (restless legs).   . finasteride (PROSCAR) 5 MG tablet Take 1 tablet (5 mg total) by mouth daily.  Marland Kitchen FLUoxetine (PROZAC) 20 MG capsule Take 20 mg by mouth daily.   . Fluticasone-Umeclidin-Vilant (TRELEGY ELLIPTA) 100-62.5-25 MCG/INH AEPB Inhale 2 puffs into the lungs at bedtime.   . gabapentin (NEURONTIN) 300 MG capsule Take 900 mg by mouth at bedtime.   Marland Kitchen losartan (COZAAR) 25 MG tablet Take 25 mg by mouth daily.   . metoprolol tartrate (LOPRESSOR) 25 MG tablet Take 1 tablet (25 mg total) by mouth 2 (two) times daily.  . montelukast (SINGULAIR) 10 MG tablet Take 10 mg by mouth at bedtime.  . Multiple Vitamin (MULTIVITAMIN  WITH MINERALS) TABS tablet Take 1 tablet by mouth daily.  . nicotine (NICODERM CQ - DOSED IN MG/24 HOURS) 21 mg/24hr patch Place 1 patch (21 mg total) onto the skin daily.  Marland Kitchen omeprazole (PRILOSEC) 20 MG capsule Take 20 mg by mouth daily.  . Oxycodone HCl 10 MG TABS Take 10 mg by mouth 4 (four) times daily as needed for pain.  . simvastatin (ZOCOR) 40 MG tablet Take 40 mg by mouth at bedtime.   . tamsulosin (FLOMAX) 0.4 MG CAPS capsule Take 1 capsule (0.4 mg total) by mouth daily after supper.  Marland Kitchen tiZANidine (ZANAFLEX) 4 MG tablet Take 4 mg by mouth 3 (three) times daily as needed for muscle spasms.    Past Medical History:  Diagnosis Date  . AAA (abdominal aortic aneurysm) without rupture ([place])   . Abdominal aneurysm ([place]) 2015   being followed but not large enough to surgically treat  . Anxiety   . Arthritis   . Asthma   . Benign hypertension 07/16/2014  . Chest pain on exertion 07/16/2014  . Chronic pain syndrome   . Coccydynia 02/08/2012  . COPD (chronic obstructive pulmonary disease) ([place])    patient smokes  . Coronary artery disease   . 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 Depression   . 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 Dyspnea   . Frequent falls   .  GERD (gastroesophageal reflux disease)   . IBS (irritable bowel syndrome)   . Myocardial infarction ([place]) 2010  . Neuropathy   . Restless leg syndrome   . Sciatica   . Spinal stenosis of lumbar region   . Stroke [facility]) 2015   no residual symptoms    Past Surgical History:  Procedure Laterality Date  . CARDIAC CATHETERIZATION  2012  . COLONOSCOPY WITH PROPOFOL N/A 11/14/2016   Procedure: COLONOSCOPY WITH PROPOFOL;  Surgeon: Manya Silvas, MD;  Location: [facility] ENDOSCOPY;  Service: Endoscopy;  Laterality: N/A;  . ESOPHAGOGASTRODUODENOSCOPY (EGD) WITH PROPOFOL N/A 11/14/2016   Procedure: ESOPHAGOGASTRODUODENOSCOPY (EGD) WITH PROPOFOL;  Surgeon: Manya Silvas, MD;  Location: [facility]  ENDOSCOPY;  Service: Endoscopy;  Laterality: N/A;  . KNEE SURGERY     right  . SHOULDER ARTHROSCOPY WITH OPEN ROTATOR CUFF REPAIR Right 08/20/2017   Procedure: SHOULDER ARTHROSCOPY WITH OPEN ROTATOR CUFF REPAIR;  Surgeon: Corky Mull, MD;  Location: [facility] ORS;  Service: Orthopedics;  Laterality: Right;  . SHOULDER ARTHROSCOPY WITH SUBACROMIAL DECOMPRESSION, ROTATOR CUFF REPAIR AND BICEP TENDON REPAIR Left 05/10/2016   Procedure: SHOULDER ARTHROSCOPY WITH DEBTRIDEMENT, DECOMPRESSION, REPAIR OF MASSIVE ROTATOR CUFF TEAR AND BICEP TENODESIS;  Surgeon: Corky Mull, MD;  Location: [facility] ORS;  Service: Orthopedics;  Laterality: Left;  Massive cuff tear  . TOTAL HIP ARTHROPLASTY Right 07/01/2018   Procedure: TOTAL HIP ARTHROPLASTY - RIGHT;  Surgeon: Corky Mull, MD;  Location: [facility] ORS;  Service: Orthopedics;  Laterality: Right;    Social History Social History   Tobacco Use  . Smoking status: Former Smoker    Packs/day: 0.50    Years: 30.00    Pack years: 15.00    Types: Cigarettes    Quit date: 09/13/2018    Years since quitting: 0.3  . Smokeless tobacco: Former Systems developer    Quit date: 02/26/1998  Substance Use Topics  . Alcohol use: Yes    Alcohol/week: 20.0 standard drinks    Types: 20 Cans of beer per week    Comment: beer daily  . Drug use: No    Family History Family History  Problem Relation Age of Onset  . Cancer Mother   . COPD Father 16       black lung    Allergies  Allergen Reactions  . Penicillins Swelling, Rash and Other (See Comments)    Did it involve swelling of the face/tongue/throat, SOB, or low BP? yes Did it involve sudden or severe rash/hives, skin peeling, or any reaction on the inside of your mouth or nose? no Did you need to seek medical attention at a hospital or doctor's office? Yes When did it last happen?years  If all above answers are "NO", may proceed with cephalosporin use.    . Lidocaine Other (See Comments)    unsure     REVIEW OF  SYSTEMS (Negative unless checked)  Constitutional: [] Weight loss  [] Fever  [] Chills Cardiac: [] Chest pain   [] Chest pressure   [] Palpitations   [] Shortness of breath when laying flat   [x] Shortness of breath with exertion. Vascular:  [] Pain in legs with walking   [] Pain in legs at rest  [] History of DVT   [] Phlebitis   [] Swelling in legs   [] Varicose veins   [] Non-healing ulcers Pulmonary:   [] Uses home oxygen   [] Productive cough   [] Hemoptysis   [] Wheeze  [x] COPD   [] Asthma Neurologic:  [] Dizziness   [] Seizures   [] History of stroke   [] History of TIA  [] Aphasia   []   Vissual changes   [] Weakness or numbness in arm   [] Weakness or numbness in leg Musculoskeletal:   [] Joint swelling   [] Joint pain   [] Low back pain Hematologic:  [] Easy bruising  [] Easy bleeding   [] Hypercoagulable state   [] Anemic Gastrointestinal:  [] Diarrhea   [] Vomiting  [] Gastroesophageal reflux/heartburn   [] Difficulty swallowing. Genitourinary:  [] Chronic kidney disease   [] Difficult urination  [] Frequent urination   [] Blood in urine Skin:  [] Rashes   [] Ulcers  Psychological:  [] History of anxiety   []  History of major depression.  Physical Examination  Vitals:   01/01/19 1142  BP: (!) 156/93  Pulse: 88  Resp: 16  Weight: 116 lb (52.6 kg)   Body mass index is 18.72 kg/m. Gen: WD/WN, NAD Head: [place]/AT, No temporalis wasting.  Ear/Nose/Throat: Hearing grossly intact, nares w/o erythema or drainage Eyes: PER, EOMI, sclera nonicteric.  Neck: Supple, no large masses.   Pulmonary:  Good air movement, no audible wheezing bilaterally, no use of accessory muscles.  Cardiac: RRR, no JVD Vascular:  Vessel Right Left  Radial Palpable Palpable  Gastrointestinal: Non-distended. No guarding/no peritoneal signs.  Musculoskeletal: M/S 5/5 throughout.  No deformity or atrophy.  Neurologic: CN 2-12 intact. Symmetrical.  Speech is fluent. Motor exam as listed above. Psychiatric: Judgment intact, Mood & affect appropriate for  pt's clinical situation. Dermatologic: No rashes or ulcers noted.  No changes consistent with cellulitis. Lymph : No lichenification or skin changes of chronic lymphedema.  CBC Lab Results  Component Value Date   WBC 10.0 12/24/2018   HGB 12.6 (L) 12/24/2018   HCT 38.0 (L) 12/24/2018   MCV 89.2 12/24/2018   PLT 360 12/24/2018    BMET    Component Value Date/Time   NA 132 (L) 12/24/2018 1610   NA 138 11/02/2014 1635   NA 139 06/23/2012 1028   K 3.7 12/24/2018 1610   K 4.6 06/23/2012 1028   CL 97 (L) 12/24/2018 1610   CL 105 06/23/2012 1028   CO2 23 12/24/2018 1610   CO2 26 06/23/2012 1028   GLUCOSE 96 12/24/2018 1610   GLUCOSE 88 06/23/2012 1028   BUN 9 12/24/2018 1610   BUN 14 11/02/2014 1635   BUN 12 06/23/2012 1028   CREATININE 0.90 12/24/2018 1610   CREATININE 0.84 06/23/2012 1028   CALCIUM 8.8 (L) 12/24/2018 1610   CALCIUM 8.8 06/23/2012 1028   GFRNONAA >60 12/24/2018 1610   GFRNONAA >60 06/23/2012 1028   GFRAA >60 12/24/2018 1610   GFRAA >60 06/23/2012 1028   Estimated Creatinine Clearance: 63.3 mL/min (by C-G formula based on SCr of 0.9 mg/dL).  COAG Lab Results  Component Value Date   INR 1.1 11/14/2018    Radiology Dg Chest 2 View  Result Date: 12/23/2018 CLINICAL DATA:  Shortness of breath and cough EXAM: CHEST - 2 VIEW COMPARISON:  August 18,2020 chest CT; chest radiograph October 22, 2018 FINDINGS: Underlying emphysematous change. There is airspace opacity superimposed on what appears to be chronic thickening in the periphery of the anterior segment of the right upper lobe. Cystic change in the right upper lobe remains as well. There is atelectatic change in the left apex. Lungs elsewhere are clear. Heart size and pulmonary vascularity are within normal limits. No adenopathy appreciable. No bone lesions. IMPRESSION: Suspect pneumonia superimposed on chronic pleural thickening in the right upper lobe. No well-defined mass in this area; a mass in this area  could easily be obscured by the degree of infiltrate seen. There is atelectasis in  the left apex. Lungs elsewhere are clear, although there is underlying emphysematous change which is better delineated by CT. The cardiac silhouette is stable and within normal limits. No adenopathy appreciable by radiography. Followup PA and lateral chest radiographs recommended in 3-4 weeks following trial of antibiotic therapy to ensure resolution and exclude underlying malignancy. Electronically Signed   By: Lowella Grip III M.D.   On: 12/23/2018 17:22   Ct Chest Wo Contrast  Result Date: 12/24/2018 CLINICAL DATA:  hemoptysis EXAM: CT CHEST WITHOUT CONTRAST TECHNIQUE: Multidetector CT imaging of the chest was performed following the standard protocol without IV contrast. COMPARISON:  Chest radiograph 10/14/2018 FINDINGS: Cardiovascular: There is calcific aortic atherosclerosis. Unchanged appearance ascending aortic aneurysm. Mediastinum/Nodes: No mediastinal lymphadenopathy. No axillary lymphadenopathy. Lungs/Pleura: There is severe centrilobular emphysema. There is progressive consolidation of the right lung apex with areas of necrosis. There is also a new area of spiculation interstitial thickening of the left lung apex. No focal abnormality in the right middle or lower lobes or in the left lower lobe. No pleural effusion. Upper Abdomen: Visualized upper abdominal structures are normal. Musculoskeletal: No chest wall mass or suspicious bone lesions identified. IMPRESSION: 1. Progressive consolidation of the right lung apex with areas of necrosis and new area of irregular opacity in the left lung apex, 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 Necrotizing right upper lobe pneumonia is also a possibility, though reportedly the patient has completed antibiotic therapy, further raising suspicion for malignancy. 2. Severe centrilobular emphysema. 3. Aortic Atherosclerosis (ICD10-I70.0) and Emphysema  (ICD10-J43.9). 4. Unchanged appearance of thoracic aortic aneurysm, within the limitations of this non-contrast study. Electronically Signed   By: Ulyses Jarred M.D.   On: 12/24/2018 20:03     Assessment/Plan 1. Abdominal aortic aneurysm (AAA) without rupture ([place]) Recommend: The aneurysm is > 5 cm and therefore should undergo repair. Patient is status post CT scan of the abdominal aorta. The patient is a candidate for endovascular repair.   He will require cardiac clearance.   The patient will continue antiplatelet therapy as prescribed (since the patient is undergoing endovascular repair as opposed to open repair) as well as aggressive management of hyperlipidemia. Exercise is again strongly encouraged.   The patient is reminded that lifetime routine surveillance is a necessity with an endograft.   The risks and benefits of AAA repair are reviewed with the patient.  All questions are answered.  Alternative therapies are also discussed.  The patient agrees to proceed with endovascular aneurysm repair.  Patient will follow-up with me in the office after the surgery.  2. Atherosclerosis of native coronary artery of native heart without angina pectoris Continue cardiac and antihypertensive medications as already ordered and reviewed, no changes at this time.  Continue statin as ordered and reviewed, no changes at this time  Nitrates PRN for chest pain   3. Essential hypertension Continue antihypertensive medications as already ordered, these medications have been reviewed and there are no changes at this time.   4. COPD exacerbation ([place]) Continue pulmonary medications and aerosols as already ordered, these medications have been reviewed and there are no changes at this time.  I will contact both Oncology and Pulmonary to see how to proceed  5. Gastroesophageal reflux disease without esophagitis Continue PPI as already ordered, this medication has been reviewed and there are no  changes at this time.  Avoidence of caffeine and alcohol  Moderate elevation of the head of the bed    Hortencia Pilar, MD  01/01/2019 9:05 PM

## 2019-01-07 ENCOUNTER — Telehealth: Payer: Self-pay | Admitting: Internal Medicine

## 2019-01-07 NOTE — Telephone Encounter (Signed)
Spoke to Dr. Delana Meyer vascular surgery regarding patient's most likely underlying lung malignancy-in general life expectancy would be the order of "many months" from "malignancy" standpoint.  I think it is reasonable to proceed with repair of his abdominal aortic aneurysm.  Spoke with Dr. Jeryl Columbia his multiple comorbidities/cognitive issues-patient will continue follow-up with pulmonary after repair of AAA-diagnosis of his "lung cancer".  Patient will be referred back to oncology after work-up with Dr.Aleskerov.   Hayley-please follow the patient.

## 2019-01-20 ENCOUNTER — Ambulatory Visit: Payer: Medicare Other | Attending: Infectious Diseases | Admitting: Infectious Diseases

## 2019-01-20 ENCOUNTER — Other Ambulatory Visit
Admission: RE | Admit: 2019-01-20 | Discharge: 2019-01-20 | Disposition: A | Discharge status: Home / Self Care | Payer: Medicare Other | Source: Ambulatory Visit | Attending: Infectious Diseases | Admitting: Infectious Diseases

## 2019-01-20 ENCOUNTER — Other Ambulatory Visit: Payer: Self-pay

## 2019-01-20 ENCOUNTER — Encounter: Payer: Self-pay | Admitting: Infectious Diseases

## 2019-01-20 VITALS — BP 121/81 | HR 66 | Temp 97.9°F | Resp 16 | Ht 66.0 in | Wt 117.6 lb

## 2019-01-20 DIAGNOSIS — Z96612 Presence of left artificial shoulder joint: Secondary | ICD-10-CM

## 2019-01-20 DIAGNOSIS — A310 Pulmonary mycobacterial infection: Secondary | ICD-10-CM

## 2019-01-20 DIAGNOSIS — Z884 Allergy status to anesthetic agent status: Secondary | ICD-10-CM

## 2019-01-20 DIAGNOSIS — R918 Other nonspecific abnormal finding of lung field: Secondary | ICD-10-CM

## 2019-01-20 DIAGNOSIS — I10 Essential (primary) hypertension: Secondary | ICD-10-CM

## 2019-01-20 DIAGNOSIS — I714 Abdominal aortic aneurysm, without rupture: Secondary | ICD-10-CM | POA: Diagnosis not present

## 2019-01-20 DIAGNOSIS — J439 Emphysema, unspecified: Secondary | ICD-10-CM

## 2019-01-20 DIAGNOSIS — F17210 Nicotine dependence, cigarettes, uncomplicated: Secondary | ICD-10-CM

## 2019-01-20 DIAGNOSIS — Z880 Allergy status to penicillin: Secondary | ICD-10-CM

## 2019-01-20 DIAGNOSIS — Z96611 Presence of right artificial shoulder joint: Secondary | ICD-10-CM

## 2019-01-20 DIAGNOSIS — Z7289 Other problems related to lifestyle: Secondary | ICD-10-CM

## 2019-01-20 DIAGNOSIS — Z96641 Presence of right artificial hip joint: Secondary | ICD-10-CM

## 2019-01-20 NOTE — Patient Instructions (Addendum)
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 You have a cavitating lesion rt upper lobe- there is a concern that it may be cancer- one sputum had MAC positive. You will need bronchoscopy to r/o cancer and also MAC! I spoke to your doctor Raul Del about this/ Also we will get three sputum for culture AFB and see whether this bacteria is there in all. If so we can treat it. You do not have the regular bacteria pneumonia as antibiotics so far have not worked Today you are getting flu vaccine as requested by you and you have no contraindication

## 2019-01-20 NOTE — Progress Notes (Signed)
NAME: Joseph Peters  DOB: 01-Feb-1956  MRN: [id number]  Date/Time: 01/20/2019 9:24 AM  REQUESTING PROVIDER: Dr. Raul Del Subjective:  REASON FOR CONSULT: MAI ? Joseph Peters is a 63 y.o. with a history of COPD, Smoker,RT THA, COPD, HTN, aortic aneurysm, chronic cough is referrred to me for possible MAC infection. For the past 1 year patient has been having recurrent cough and bringing up sputum and sometimes blood-tinged.  He has been on multiple courses of antibiotics with no relief.  He has been given multiple courses of steroids and also is using inhaler.  He gets regular CT chest and the one done this year in June 2020 revealed a new 1 cm mildly spiculated pulmonary nodule of the right upper lobe concerning for malignancy. He is followed by Dr. Raul Del the pulmonologist at [facility]. On October 14, 2018 he presented to the [facility] emergency department with right lower quadrant abdominal pain.  CT angio of the chest abdomen and pelvis showed no acute PE, slightly enlarging 4.8 cm abdominal aortic aneurysm with progressive right renal artery occlusion.  On the chest CT there was advanced pulmonary emphysema with new patchy perireferral airspace consolidation in the anterior right upper lobe.  This was obscuring the right upper lobe nodules.  I spoke to Dr. Raul Del.  He said his partnerl was trying to do a bronchoscopy but he could not get clearance from anesthesia because of the aneurysm. 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  He is referred to me for possible treatment. Patient does not have any fever.  He has lost some weight He has poor appetite He has chronic cough He has some blood-tinged sputum He continues to smoke He also drinks 20 cans of beer a week. He has seen Dr. Franchot Gallo vascular surgeon on 01/01/2019 and as the size of the abdominal aortic aneurysm was more than 5 cm he recommends endovascular repair.  But he needs cardiac clearance.  His  cardiologist Dr. Ubaldo Glassing and and a nuclear medicine SPECT scan done in September 2020 revealed mildly reduced LV function at 50% and fixed inferior defect no reversible ischemia.  The Lexiscan stress test was negative.  He has to see Dr. Ubaldo Glassing now.  May 2020 patient had right total hip replacement and has done well.  Looks like he got it under spinal anesthesia. Past Medical History:  Diagnosis Date  . AAA (abdominal aortic aneurysm) without rupture ([place])   . Abdominal aneurysm ([place]) 2015   being followed but not large enough to surgically treat  . Anxiety   . Arthritis   . Asthma   . Benign hypertension 07/16/2014  . Chest pain on exertion 07/16/2014  . Chronic pain syndrome   . Coccydynia 02/08/2012  . COPD (chronic obstructive pulmonary disease) ([place])    patient smokes  . Coronary artery disease   . 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 Depression   . 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 Dyspnea   . Frequent falls   . GERD (gastroesophageal reflux disease)   . IBS (irritable bowel syndrome)   . Myocardial infarction ([place]) 2010  . Neuropathy   . Restless leg syndrome   . Sciatica   . Spinal stenosis of lumbar region   . Stroke [facility]) 2015   no residual symptoms    Past Surgical History:  Procedure Laterality Date  . CARDIAC CATHETERIZATION  2012  . COLONOSCOPY WITH PROPOFOL N/A 11/14/2016  Procedure: COLONOSCOPY WITH PROPOFOL;  Surgeon: Manya Silvas, MD;  Location: [facility] ENDOSCOPY;  Service: Endoscopy;  Laterality: N/A;  . ESOPHAGOGASTRODUODENOSCOPY (EGD) WITH PROPOFOL N/A 11/14/2016   Procedure: ESOPHAGOGASTRODUODENOSCOPY (EGD) WITH PROPOFOL;  Surgeon: Manya Silvas, MD;  Location: [facility] ENDOSCOPY;  Service: Endoscopy;  Laterality: N/A;  . KNEE SURGERY     right  . SHOULDER ARTHROSCOPY WITH OPEN ROTATOR CUFF REPAIR Right 08/20/2017   Procedure: SHOULDER ARTHROSCOPY WITH OPEN ROTATOR CUFF REPAIR;  Surgeon: Corky Mull, MD;  Location:  [facility] ORS;  Service: Orthopedics;  Laterality: Right;  . SHOULDER ARTHROSCOPY WITH SUBACROMIAL DECOMPRESSION, ROTATOR CUFF REPAIR AND BICEP TENDON REPAIR Left 05/10/2016   Procedure: SHOULDER ARTHROSCOPY WITH DEBTRIDEMENT, DECOMPRESSION, REPAIR OF MASSIVE ROTATOR CUFF TEAR AND BICEP TENODESIS;  Surgeon: Corky Mull, MD;  Location: [facility] ORS;  Service: Orthopedics;  Laterality: Left;  Massive cuff tear  . TOTAL HIP ARTHROPLASTY Right 07/01/2018   Procedure: TOTAL HIP ARTHROPLASTY - RIGHT;  Surgeon: Corky Mull, MD;  Location: [facility] ORS;  Service: Orthopedics;  Laterality: Right;    Social History   Socioeconomic History  . Marital status: Married    Spouse name: Not on file  . Number of children: Not on file  . Years of education: Not on file  . Highest education level: Not on file  Occupational History  . Not on file  Social Needs  . Financial resource strain: Not on file  . Food insecurity    Worry: Not on file    Inability: Not on file  . Transportation needs    Medical: Not on file    Non-medical: Not on file  Tobacco Use  . Smoking status: Current Every Day Smoker    Packs/day: 0.50    Years: 30.00    Pack years: 15.00    Types: Cigarettes    Start date: 02/26/1973  . Smokeless tobacco: Former Systems developer    Quit date: 02/26/1998  . Tobacco comment: tried to quit in past   Substance and Sexual Activity  . Alcohol use: Yes    Alcohol/week: 20.0 standard drinks    Types: 20 Cans of beer per week    Comment: week   . Drug use: No  . Sexual activity: Not on file  Lifestyle  . Physical activity    Days per week: Not on file    Minutes per session: Not on file  . Stress: Not on file  Relationships  . Social Herbalist on phone: Not on file    Gets together: Not on file    Attends religious service: Not on file    Active member of club or organization: Not on file    Attends meetings of clubs or organizations: Not on file    Relationship status: Not on file  . Intimate  partner violence    Fear of current or ex partner: Not on file    Emotionally abused: Not on file    Physically abused: Not on file    Forced sexual activity: Not on file  Other Topics Concern  . Not on file  Social History Narrative   Patient is a Nature conservation officer.;  He lives with his wife at home.  Longstanding history of smoking; quit recently.  Denies alcohol abuse.    Family History  Problem Relation Age of Onset  . Cancer Mother   . COPD Father 59       black lung   Allergies  Allergen Reactions  .  Penicillins Swelling, Rash and Other (See Comments)    Did it involve swelling of the face/tongue/throat, SOB, or low BP? yes Did it involve sudden or severe rash/hives, skin peeling, or any reaction on the inside of your mouth or nose? no Did you need to seek medical attention at a hospital or doctor's office? Yes When did it last happen?years  If all above answers are "NO", may proceed with cephalosporin use.    . Lidocaine Other (See Comments)    unsure   ? Current Outpatient Medications  Medication Sig Dispense Refill  . albuterol (PROVENTIL HFA;VENTOLIN HFA) 108 (90 Base) MCG/ACT inhaler Inhale 2 puffs into the lungs every 4 (four) hours as needed for wheezing or shortness of breath.     Marland Kitchen DALIRESP 250 MCG TABS Take 250 mcg by mouth at bedtime. 30 tablet 0  . finasteride (PROSCAR) 5 MG tablet Take 1 tablet (5 mg total) by mouth daily. 90 tablet 3  . FLUoxetine (PROZAC) 20 MG capsule Take 20 mg by mouth daily.     . Fluticasone-Umeclidin-Vilant (TRELEGY ELLIPTA) 100-62.5-25 MCG/INH AEPB Inhale 2 puffs into the lungs at bedtime.     . gabapentin (NEURONTIN) 300 MG capsule Take 900 mg by mouth at bedtime.     Marland Kitchen losartan (COZAAR) 25 MG tablet Take 25 mg by mouth daily.     . montelukast (SINGULAIR) 10 MG tablet Take 10 mg by mouth at bedtime.    . Multiple Vitamin (MULTIVITAMIN WITH MINERALS) TABS tablet Take 1 tablet by mouth daily. 30 tablet 0  . Nebulizers (COMP  AIR COMPRESSOR NEBULIZER) MISC USE AS DIRECTED    . omeprazole (PRILOSEC) 20 MG capsule Take 20 mg by mouth daily.    . Oxycodone HCl 10 MG TABS Take 10 mg by mouth 4 (four) times daily as needed for pain.    . simvastatin (ZOCOR) 40 MG tablet Take 40 mg by mouth at bedtime.     . tamsulosin (FLOMAX) 0.4 MG CAPS capsule Take 1 capsule (0.4 mg total) by mouth daily after supper. 90 capsule 1  . diazepam (VALIUM) 5 MG tablet Take 5 mg by mouth at bedtime as needed (restless legs).   1  . metoprolol tartrate (LOPRESSOR) 25 MG tablet Take 1 tablet (25 mg total) by mouth 2 (two) times daily. (Patient not taking: Reported on 01/20/2019) 60 tablet 0   No current facility-administered medications for this visit.      Abtx:  Anti-infectives (From admission, onward)   None      REVIEW OF SYSTEMS:  Const: negative fever, negative chills, positive weight loss Eyes: negative diplopia or visual changes, negative eye pain ENT: negative coryza, negative sore throat Resp: Cough, shortness of breath, hemoptysis Cards: negative for chest pain, palpitations, lower extremity edema GU: negative for frequency, dysuria and hematuria GI: Has occasional abdominal pain Skin: negative for rash and pruritus Heme: negative for easy bruising and gum/nose bleeding MS: negative for myalgias, arthralgias, back pain and muscle weakness Neurolo:negative for headaches, dizziness, vertigo, memory problems  Psych: negative for feelings of anxiety, depression  Endocrine: negative for thyroid, diabetes Allergy/Immunology- negative for any medication or food allergies ? Pertinent Positives include : Objective:  VITALS:  BP 121/81   Pulse 66   Temp 97.9 F (36.6 C) (Temporal)   Resp 16   Ht _0  (1.676 m)   Wt 117 lb 9.6 oz (53.3 kg)   SpO2 97%   BMI 18.98 kg/m  PHYSICAL EXAM:  General: Alert, cooperative,  emaciated head: Normocephalic, without obvious abnormality, atraumatic. Eyes: Conjunctivae clear,  anicteric sclerae. Pupils are equal ENT Nares normal. No drainage or sinus tenderness. Lips, mucosa, and tongue normal. No Thrush Neck: Supple, symmetrical, no adenopathy, thyroid: non tender no carotid bruit and no JVD. Back: No CVA tenderness. Lungs: Clear to auscultation bilaterally. No Wheezing or Rhonchi. No rales. Heart: Regular rate and rhythm, no murmur, rub or gallop. Abdomen: Soft, non-tender,not distended. Bowel sounds normal. No masses Extremities: atraumatic, no cyanosis. No edema. No clubbing Skin: No rashes or lesions. Or bruising Lymph: Cervical, supraclavicular normal. Neurologic: Grossly non-focal Pertinent Labs Lab Results CBC    Component Value Date/Time   WBC 10.0 12/24/2018 1610   RBC 4.26 12/24/2018 1610   HGB 12.6 (L) 12/24/2018 1610   HGB 15.6 06/23/2012 1028   HCT 38.0 (L) 12/24/2018 1610   HCT 44.3 06/23/2012 1028   PLT 360 12/24/2018 1610   PLT 174 06/23/2012 1028   MCV 89.2 12/24/2018 1610   MCV 93 06/23/2012 1028   MCH 29.6 12/24/2018 1610   MCHC 33.2 12/24/2018 1610   RDW 14.6 12/24/2018 1610   RDW 13.1 06/23/2012 1028   LYMPHSABS 1.0 11/14/2018 1122   MONOABS 1.3 (H) 11/14/2018 1122   EOSABS 0.0 11/14/2018 1122   BASOSABS 0.0 11/14/2018 1122    CMP Latest Ref Rng & Units 12/24/2018 12/23/2018 11/14/2018  Glucose 70 - 99 mg/dL 96 110(H) 78  BUN 8 - 23 mg/dL _0 Creatinine 0.61 - 1.24 mg/dL 0.90 1.06 1.10  Sodium 135 - 145 mmol/L 132(L) 136 132(L)  Potassium 3.5 - 5.1 mmol/L 3.7 4.2 3.9  Chloride 98 - 111 mmol/L 97(L) 100 96(L)  CO2 22 - 32 mmol/L _1 Calcium 8.9 - 10.3 mg/dL 8.8(L) 9.3 8.8(L)  Total Protein 6.5 - 8.1 g/dL - - -  Total Bilirubin 0.3 - 1.2 mg/dL - - -  Alkaline Phos 38 - 126 U/L - - -  AST 15 - 41 U/L - - -  ALT 0 - 44 U/L - - -      Microbiology:  IMAGING RESULTS:  I have personally reviewed the films Progressive consolidation of the right lung apex with areas of necrosis and new area of irregular  opacity in the left lung apex, 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 Necrotizing right upper lobe pneumonia is also a possibility, though reportedly the patient has completed antibiotic therapy, further raising suspicion for malignancy. 2. Severe centrilobular emphysema.?      Impression/Recommendation  COPD with emphysema on inhalers and short course of steroids  Right upper lobe infiltrate with cavitation.  Started as nodules.  With high risk for smoking need to rule out malignancy.  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  This could be colonization.  He needs bronchoscopy and to be sent for BAL as well as for biopsy.  Meanwhile if he gets 3 sputum for AFB culture and if they are positive then I can start the treatment for MAC. Discussed with Dr. Raul Del who said patient was not cleared by anesthesia to undergo bronchoscopy in September.  The nuclear medicine myocardial perfusion SPECT scan from 11/07/2018 shows mildly reduced LV function at 50%.  Fixed inferior defect with no reversible ischemia.  Negative Lexiscan stress test.  Abdominal aortic aneurysm more than 5 cm.  Awaiting endovascular repair. 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 Raul Del.  Note:  This document was prepared using Dragon voice recognition software and may include unintentional dictation errors.

## 2019-01-21 ENCOUNTER — Other Ambulatory Visit
Admission: RE | Admit: 2019-01-21 | Discharge: 2019-01-21 | Disposition: A | Discharge status: Home / Self Care | Payer: Medicare Other | Source: Ambulatory Visit | Attending: Infectious Diseases | Admitting: Infectious Diseases

## 2019-01-21 DIAGNOSIS — A310 Pulmonary mycobacterial infection: Secondary | ICD-10-CM | POA: Diagnosis present

## 2019-01-30 ENCOUNTER — Other Ambulatory Visit: Payer: Self-pay | Admitting: Pulmonary Disease

## 2019-01-30 DIAGNOSIS — R918 Other nonspecific abnormal finding of lung field: Secondary | ICD-10-CM

## 2019-02-02 ENCOUNTER — Telehealth (INDEPENDENT_AMBULATORY_CARE_PROVIDER_SITE_OTHER): Payer: Self-pay | Admitting: Vascular Surgery

## 2019-02-02 ENCOUNTER — Ambulatory Visit (INDEPENDENT_AMBULATORY_CARE_PROVIDER_SITE_OTHER): Payer: Medicare Other | Admitting: Vascular Surgery

## 2019-02-02 ENCOUNTER — Encounter (INDEPENDENT_AMBULATORY_CARE_PROVIDER_SITE_OTHER): Payer: Medicare Other

## 2019-02-02 ENCOUNTER — Telehealth: Payer: Self-pay

## 2019-02-02 NOTE — Telephone Encounter (Signed)
Patient showed up this morning for his yearly appt from last year that was cancelled in May of this year. Wife states that she doesn't understand why it has been cancelled when he has not has his sgy yet. Last note in November discusses moving forward with sgy. I advised them that I would send a note to have someone call and clarify what is going on

## 2019-02-02 NOTE — Telephone Encounter (Signed)
Asking about MAC labs that they did before Thanksgiving. Please review lab and instruct me on what to advise or contact patient and attach documentation of call.

## 2019-02-02 NOTE — Telephone Encounter (Signed)
This patient was originally scheduled to have his AAA done in September however he had pneumonia and had to be cleared from that standpoint.  As per the last note in November, we still need to move forward with his surgery.  I'm not sure where his pulmonologist stands.  I noticed that there was a message left for them to contact you regarding this.  Could you touch base with them and explain this to them. Thanks.

## 2019-02-03 LAB — AFB ORGANISM ID BY DNA PROBE: M avium complex: POSITIVE — AB

## 2019-02-03 LAB — ACID FAST CULTURE WITH REFLEXED SENSITIVITIES (MYCOBACTERIA): Acid Fast Culture: POSITIVE — AB

## 2019-02-04 ENCOUNTER — Encounter
Admission: RE | Admit: 2019-02-04 | Discharge: 2019-02-04 | Disposition: A | Discharge status: Home / Self Care | Payer: Medicare Other | Source: Ambulatory Visit | Attending: Pulmonary Disease | Admitting: Pulmonary Disease

## 2019-02-04 ENCOUNTER — Other Ambulatory Visit: Payer: Self-pay

## 2019-02-04 DIAGNOSIS — Z01812 Encounter for preprocedural laboratory examination: Secondary | ICD-10-CM | POA: Insufficient documentation

## 2019-02-04 HISTORY — DX: Inflammatory disease of prostate, unspecified: N41.9

## 2019-02-04 HISTORY — DX: Hyperlipidemia, unspecified: E78.5

## 2019-02-04 HISTORY — DX: Alcohol dependence, uncomplicated: F10.20

## 2019-02-04 HISTORY — DX: Essential tremor: G25.0

## 2019-02-04 LAB — CBC
HCT: 41.3 % (ref 39.0–52.0)
Hemoglobin: 13.7 g/dL (ref 13.0–17.0)
MCH: 29.5 pg (ref 26.0–34.0)
MCHC: 33.2 g/dL (ref 30.0–36.0)
MCV: 89 fL (ref 80.0–100.0)
Platelets: 246 10*3/uL (ref 150–400)
RBC: 4.64 MIL/uL (ref 4.22–5.81)
RDW: 15 % (ref 11.5–15.5)
WBC: 7.9 10*3/uL (ref 4.0–10.5)
nRBC: 0 % (ref 0.0–0.2)

## 2019-02-04 LAB — PROTIME-INR
INR: 1 (ref 0.8–1.2)
Prothrombin Time: 13.2 seconds (ref 11.4–15.2)

## 2019-02-04 LAB — APTT: aPTT: 31 seconds (ref 24–36)

## 2019-02-04 NOTE — Patient Instructions (Signed)
Your procedure is scheduled on: Friday 12/18 Report to Day Surgery. To find out your arrival time please call [phone] between 1PM - 3PM on Thurs 12/17.  Remember: Instructions that are not followed completely may result in serious medical risk,  up to and including death, or upon the discretion of your surgeon and anesthesiologist your  surgery may need to be rescheduled.     _X__ 1. Do not eat food after midnight the night before your procedure.                 No gum chewing or hard candies. You may drink clear liquids up to 2 hours                 before you are scheduled to arrive for your surgery- DO not drink clear                 liquids within 2 hours of the start of your surgery.                 Clear Liquids include:  water, apple juice without pulp, clear carbohydrate                 drink such as Clearfast of Gatorade, Black Coffee or Tea (Do not add                 anything to coffee or tea).  __X__2.  On the morning of surgery brush your teeth with toothpaste and water, you                may rinse your mouth with mouthwash if you wish.  Do not swallow any toothpaste of mouthwash.     _X__ 3.  No Alcohol for 24 hours before or after surgery.   _X__ 4.  Do Not Smoke or use e-cigarettes For 24 Hours Prior to Your Surgery.                 Do not use any chewable tobacco products for at least 6 hours prior to                 surgery.  ____  5.  Bring all medications with you on the day of surgery if instructed.   ___x_  6.  Notify your doctor if there is any change in your medical condition      (cold, fever, infections).     Do not wear jewelry, make-up, hairpins, clips or nail polish. Do not wear lotions, powders, or perfumes. You may wear deodorant. Do not shave 48 hours prior to surgery. Men may shave face and neck. Do not bring valuables to the hospital.    [facility] is not responsible for any belongings or valuables.  Contacts,  dentures or bridgework may not be worn into surgery. Leave your suitcase in the car. After surgery it may be brought to your room. For patients admitted to the hospital, discharge time is determined by your treatment team.   Patients discharged the day of surgery will not be allowed to drive home.   Please read over the following fact sheets that you were given:    __x__ Take these medicines the morning of surgery with A SIP OF WATER:    1. omeprazole (PRILOSEC) 20 MG capsule  Dose the night before and the morning of surgery  2. finasteride (PROSCAR) 5 MG tablet  3. FLUoxetine (PROZAC) 20 MG capsule  4.  5.  6.  ____ Fleet Enema (as directed)   ____ Use CHG Soap as directed  _x___ Use inhalers on the day of surgeryalbuterol (PROVENTIL HFA;VENTOLIN HFA) 108 (90 Base) MCG/ACT inhaler and bring it with you. Fluticasone-Umeclidin-Vilant (TRELEGY ELLIPTA) 100-62.5-25 MCG/INH AEPB  ____ Stop metformin 2 days prior to surgery    ____ Take 1/2 of usual insulin dose the night before surgery. No insulin the morning          of surgery.   _x___ Stopped aspirin already  __x__ Stop Anti-inflammatories   Ibuprofen aleve motrin advil    May take tylenol   ____ Stop supplements until after surgery.    ____ Bring C-Pap to the hospital.

## 2019-02-10 ENCOUNTER — Telehealth: Payer: Self-pay | Admitting: Infectious Diseases

## 2019-02-10 ENCOUNTER — Other Ambulatory Visit
Admission: RE | Admit: 2019-02-10 | Discharge: 2019-02-10 | Disposition: A | Discharge status: Home / Self Care | Payer: Medicare Other | Source: Ambulatory Visit | Attending: Pulmonary Disease | Admitting: Pulmonary Disease

## 2019-02-10 ENCOUNTER — Ambulatory Visit: Payer: Medicare Other | Admitting: Infectious Diseases

## 2019-02-10 DIAGNOSIS — Z20828 Contact with and (suspected) exposure to other viral communicable diseases: Secondary | ICD-10-CM | POA: Insufficient documentation

## 2019-02-10 DIAGNOSIS — Z01812 Encounter for preprocedural laboratory examination: Secondary | ICD-10-CM | POA: Insufficient documentation

## 2019-02-10 LAB — SARS CORONAVIRUS 2 (TAT 6-24 HRS): SARS Coronavirus 2: NEGATIVE

## 2019-02-10 NOTE — Telephone Encounter (Signed)
Joseph Peters called and stated that she was waiting on specimen results from before thanksgiving.  Requesting a call back at [phone]

## 2019-02-11 ENCOUNTER — Ambulatory Visit: Admission: RE | Admit: 2019-02-11 | Payer: Medicare Other | Source: Ambulatory Visit

## 2019-02-11 LAB — AFB ORGANISM ID BY DNA PROBE: M avium complex: POSITIVE — AB

## 2019-02-11 LAB — ACID FAST CULTURE WITH REFLEXED SENSITIVITIES (MYCOBACTERIA): Acid Fast Culture: POSITIVE — AB

## 2019-02-11 LAB — MAC SUSCEPTIBILITY BROTH
Clarithromycin: 2
Linezolid: 32
Moxifloxacin: 4
Streptomycin: 64

## 2019-02-12 ENCOUNTER — Other Ambulatory Visit: Payer: Self-pay

## 2019-02-12 ENCOUNTER — Ambulatory Visit: Payer: Medicare Other | Attending: Infectious Diseases | Admitting: Infectious Diseases

## 2019-02-12 ENCOUNTER — Encounter: Payer: Self-pay | Admitting: Infectious Diseases

## 2019-02-12 VITALS — BP 188/117 | HR 80 | Temp 97.3°F | Resp 17 | Ht 66.0 in | Wt 120.0 lb

## 2019-02-12 DIAGNOSIS — A310 Pulmonary mycobacterial infection: Secondary | ICD-10-CM

## 2019-02-12 DIAGNOSIS — Z96612 Presence of left artificial shoulder joint: Secondary | ICD-10-CM

## 2019-02-12 DIAGNOSIS — I719 Aortic aneurysm of unspecified site, without rupture: Secondary | ICD-10-CM

## 2019-02-12 DIAGNOSIS — Z720 Tobacco use: Secondary | ICD-10-CM

## 2019-02-12 DIAGNOSIS — I10 Essential (primary) hypertension: Secondary | ICD-10-CM

## 2019-02-12 DIAGNOSIS — Z96641 Presence of right artificial hip joint: Secondary | ICD-10-CM

## 2019-02-12 DIAGNOSIS — Z96611 Presence of right artificial shoulder joint: Secondary | ICD-10-CM

## 2019-02-12 DIAGNOSIS — R918 Other nonspecific abnormal finding of lung field: Secondary | ICD-10-CM | POA: Diagnosis not present

## 2019-02-12 DIAGNOSIS — J439 Emphysema, unspecified: Secondary | ICD-10-CM | POA: Diagnosis not present

## 2019-02-12 DIAGNOSIS — Z884 Allergy status to anesthetic agent status: Secondary | ICD-10-CM

## 2019-02-12 DIAGNOSIS — N280 Ischemia and infarction of kidney: Secondary | ICD-10-CM

## 2019-02-12 DIAGNOSIS — Z880 Allergy status to penicillin: Secondary | ICD-10-CM

## 2019-02-12 DIAGNOSIS — Z7289 Other problems related to lifestyle: Secondary | ICD-10-CM

## 2019-02-12 LAB — ACID FAST CULTURE WITH REFLEXED SENSITIVITIES (MYCOBACTERIA): Acid Fast Culture: POSITIVE — AB

## 2019-02-12 LAB — AFB ORGANISM ID BY DNA PROBE: M avium complex: POSITIVE — AB

## 2019-02-12 NOTE — Patient Instructions (Signed)
You are here for follow up of Pulmonary MAC infection.you have cavitary lesions in the lungs and the question is whether you just have the MAC or do you have cancer? You have underlying COPD and you continue to smoke/ Tomorrow you are having a lung biopsy!  If we have to start treatment for MAC - you will need 4 antibiotics 3 tablets and one injection ( amikacin)  Ethambutol 836m once a day _ you need to get an eye examination at baseline to look for color  Or visual  defects-,optic neuritis   Rifampin 6029monce a day- This drug causes interactions with other drugs- you cannot take simvastatin- your doctor should change to rosuvastatin Azithromycin 50011mnce a day  Amikacin injection-   _______________________________________________________  fi your doctor wants to operate on your aneursym there is no contraindication from this infection but it would be your COPD that can cause the problem  __________________________________________________________  Once you have the lung biopsy we will discuss with all your docotrs and start the treatment  While on these meds you cannot drink alcohol, you will need labs on regualr basis Attached are the side effects of the  meds

## 2019-02-12 NOTE — Progress Notes (Signed)
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 male with a history of COPD, Smoker,RT THA, COPD, HTN, aortic aneurysm, chronic cough. For the past 1 year patient has been having recurrent cough and bringing up sputum and sometimes blood-tinged.  He has been on multiple courses of antibiotics with no relief.  He has been given multiple courses of steroids and also is using inhaler.  He gets regular CT chest and the one done this year in June 2020 revealed a new 1 cm mildly spiculated pulmonary nodule of the right upper lobe concerning for malignancy. He is followed by Dr. Raul Del the pulmonologist at [facility]. On October 14, 2018 he presented to the [facility] emergency department with right lower quadrant abdominal pain.  CT angio of the chest abdomen and pelvis showed no acute PE, slightly enlarging 4.8 cm abdominal aortic aneurysm with progressive right renal artery occlusion.  On the chest CT there was advanced pulmonary emphysema with new patchy perireferral airspace consolidation in the anterior right upper lobe.  This was obscuring the right upper lobe nodules.  I spoke to Dr. Raul Del.  He said his partnerl was trying to do a bronchoscopy but he could not get clearance from anesthesia because of the aneurysm. 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  He is referred to me for possible treatment. Patient does not have any fever.  He has lost some weight He has poor appetite He has chronic cough He has some blood-tinged sputum He continues to smoke He also drinks 20 cans of beer a  week. He has seen Dr. Franchot Gallo vascular surgeon on 01/01/2019 and as the size of the abdominal aortic aneurysm was more than 5 cm he recommends endovascular repair.  But he needs cardiac clearance.  His cardiologist Dr. Ubaldo Glassing did a nuclear medicine SPECT scan done in September 2020 revealed mildly reduced LV function at 50% and fixed inferior defect no reversible ischemia.  The Lexiscan stress test was negative.   May 2020 patient had right total hip replacement and has done well.  Looks like he got it under spinal anesthesia.   Past Medical History:  Diagnosis Date  . AAA (abdominal aortic aneurysm) without rupture ([place])   . Abdominal aneurysm ([place]) 2015   being followed but not large enough to surgically treat  . Alcoholism ([place])   . Anxiety   . Arthritis   . Asthma   . Benign hypertension 07/16/2014  . Chest pain on exertion 07/16/2014  . Chronic pain syndrome   . Coccydynia 02/08/2012  . COPD (chronic obstructive pulmonary disease) ([place])    patient smokes  . Coronary artery disease   . 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 Depression   . 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 Dyspnea   . Frequent falls   . GERD (gastroesophageal reflux disease)   . Hyperlipidemia   . IBS (irritable bowel syndrome)   . Myocardial infarction ([place]) 2010  . Neuropathy   . Prostatitis   . Restless leg syndrome   .  Sciatica   . Spinal stenosis of lumbar region   . Stroke ([place]) 2015   no residual symptoms  . Tremor, essential     Past Surgical History:  Procedure Laterality Date  . CARDIAC CATHETERIZATION  2012  . COLONOSCOPY WITH PROPOFOL N/A 11/14/2016   Procedure: COLONOSCOPY WITH PROPOFOL;  Surgeon: Manya Silvas, MD;  Location: [facility] ENDOSCOPY;  Service: Endoscopy;  Laterality: N/A;  . ESOPHAGOGASTRODUODENOSCOPY (EGD) WITH PROPOFOL N/A 11/14/2016   Procedure: ESOPHAGOGASTRODUODENOSCOPY (EGD) WITH PROPOFOL;  Surgeon: Manya Silvas, MD;  Location:  [facility] ENDOSCOPY;  Service: Endoscopy;  Laterality: N/A;  . JOINT REPLACEMENT Right    hip  . KNEE SURGERY     right  . SHOULDER ARTHROSCOPY WITH OPEN ROTATOR CUFF REPAIR Right 08/20/2017   Procedure: SHOULDER ARTHROSCOPY WITH OPEN ROTATOR CUFF REPAIR;  Surgeon: Corky Mull, MD;  Location: [facility] ORS;  Service: Orthopedics;  Laterality: Right;  . SHOULDER ARTHROSCOPY WITH SUBACROMIAL DECOMPRESSION, ROTATOR CUFF REPAIR AND BICEP TENDON REPAIR Left 05/10/2016   Procedure: SHOULDER ARTHROSCOPY WITH DEBTRIDEMENT, DECOMPRESSION, REPAIR OF MASSIVE ROTATOR CUFF TEAR AND BICEP TENODESIS;  Surgeon: Corky Mull, MD;  Location: [facility] ORS;  Service: Orthopedics;  Laterality: Left;  Massive cuff tear  . TOTAL HIP ARTHROPLASTY Right 07/01/2018   Procedure: TOTAL HIP ARTHROPLASTY - RIGHT;  Surgeon: Corky Mull, MD;  Location: [facility] ORS;  Service: Orthopedics;  Laterality: Right;    Social History   Socioeconomic History  . Marital status: Married    Spouse name: Not on file  . Number of children: Not on file  . Years of education: Not on file  . Highest education level: Not on file  Occupational History  . Not on file  Tobacco Use  . Smoking status: Light Tobacco Smoker    Packs/day: 0.50    Years: 30.00    Pack years: 15.00    Types: Cigarettes    Start date: 02/26/1973    Last attempt to quit: 09/21/2018    Years since quitting: 0.3  . Smokeless tobacco: Former Systems developer    Quit date: 02/26/1998  . Tobacco comment: smokes 2-3 cig a week 02/12/19  Substance and Sexual Activity  . Alcohol use: Yes    Alcohol/week: 20.0 standard drinks    Types: 20 Cans of beer per week    Comment: week   . Drug use: No  . Sexual activity: Not on file  Other Topics Concern  . Not on file  Social History Narrative   Patient is a Nature conservation officer.;  He lives with his wife at home.  Longstanding history of smoking; quit recently.  Denies alcohol abuse.   Social Determinants of Health   Financial Resource Strain:    . Difficulty of Paying Living Expenses: Not on file  Food Insecurity:   . Worried About Charity fundraiser in the Last Year: Not on file  . Ran Out of Food in the Last Year: Not on file  Transportation Needs:   . Lack of Transportation (Medical): Not on file  . Lack of Transportation (Non-Medical): Not on file  Physical Activity:   . Days of Exercise per Week: Not on file  . Minutes of Exercise per Session: Not on file  Stress:   . Feeling of Stress : Not on file  Social Connections:   . Frequency of Communication with Friends and Family: Not on file  . Frequency of Social Gatherings with Friends and Family: Not on file  . Attends Religious Services:  Not on file  . Active Member of Clubs or Organizations: Not on file  . Attends Archivist Meetings: Not on file  . Marital Status: Not on file  Intimate Partner Violence:   . Fear of Current or Ex-Partner: Not on file  . Emotionally Abused: Not on file  . Physically Abused: Not on file  . Sexually Abused: Not on file    Family History  Problem Relation Age of Onset  . Cancer Mother   . COPD Father 52       black lung   Allergies  Allergen Reactions  . Penicillins Swelling, Rash and Other (See Comments)    Swelling in throat  Did it involve swelling of the face/tongue/throat, SOB, or low BP? yes Did it involve sudden or severe rash/hives, skin peeling, or any reaction on the inside of your mouth or nose? no Did you need to seek medical attention at a hospital or doctor's office? Yes When did it last happen?years  If all above answers are "NO", may proceed with cephalosporin use.    . Lidocaine Other (See Comments)    Unsure  At dentist's office   ? Current Outpatient Medications  Medication Sig Dispense Refill  . albuterol (PROVENTIL HFA;VENTOLIN HFA) 108 (90 Base) MCG/ACT inhaler Inhale 2 puffs into the lungs every 4 (four) hours as needed for wheezing or shortness of breath.     Marland Kitchen DALIRESP 250 MCG  TABS Take 250 mcg by mouth at bedtime. 30 tablet 0  . finasteride (PROSCAR) 5 MG tablet Take 1 tablet (5 mg total) by mouth daily. 90 tablet 3  . FLUoxetine (PROZAC) 20 MG capsule Take 20 mg by mouth daily.     . Fluticasone-Umeclidin-Vilant (TRELEGY ELLIPTA) 100-62.5-25 MCG/INH AEPB Inhale 2 puffs into the lungs at bedtime.     . gabapentin (NEURONTIN) 300 MG capsule Take 900 mg by mouth at bedtime.     Marland Kitchen losartan (COZAAR) 25 MG tablet Take 25 mg by mouth daily.     . montelukast (SINGULAIR) 10 MG tablet Take 10 mg by mouth at bedtime.    . Nebulizers (COMP AIR COMPRESSOR NEBULIZER) MISC USE AS DIRECTED    . omeprazole (PRILOSEC) 20 MG capsule Take 20 mg by mouth daily.    . Oxycodone HCl 10 MG TABS Take 10 mg by mouth 4 (four) times daily as needed for pain.    . simvastatin (ZOCOR) 40 MG tablet Take 40 mg by mouth at bedtime.     . tamsulosin (FLOMAX) 0.4 MG CAPS capsule Take 1 capsule (0.4 mg total) by mouth daily after supper. 90 capsule 1   No current facility-administered medications for this visit.      REVIEW OF SYSTEMS:  Const: negative fever, negative chills,  weight loss Eyes: negative diplopia or visual changes, negative eye pain ENT: negative coryza, negative sore throat Resp: Cough, shortness of breath, hemoptysis Cards: negative for chest pain, palpitations, lower extremity edema GU: negative for frequency, dysuria and hematuria GI: Has occasional abdominal pain Skin: negative for rash and pruritus Heme: negative for easy bruising and gum/nose bleeding MS: negative for myalgias, arthralgias, back pain and muscle weakness Neurolo:negative for headaches, dizziness, vertigo, memory problems  Psych: negative for feelings of anxiety, depression  Endocrine: negative for thyroid, diabetes Allergy/Immunology-Penicillin Objective:  VITALS:  BP (!) 188/117   Pulse 80   Temp (!) 97.3 F (36.3 C) (Oral)   Resp 17   Ht _0  (1.676 m)   Wt 120 lb (  54.4 kg)   SpO2 95%    BMI 19.37 kg/m  PHYSICAL EXAM:  General: Alert, cooperative, emaciated  Head: Normocephalic, without obvious abnormality, atraumatic. Eyes: Conjunctivae clear, anicteric sclerae. Pupils are equal ENT Nares normal. No drainage or sinus tenderness. Lips, mucosa, and tongue normal. No Thrush Neck: Supple, symmetrical, no adenopathy, thyroid: non tender no carotid bruit and no JVD. Back: No CVA tenderness. Lungs: b/l air entry- few rhonchiHeart: Regular rate and rhythm, no murmur, rub or gallop. Abdomen: Soft, non-tender,not distended. Bowel sounds normal. No masses Extremities: atraumatic, no cyanosis. No edema. No clubbing Skin: No rashes or lesions. Or bruising Lymph: Cervical, supraclavicular normal. Neurologic: Grossly non-focal Pertinent Labs Lab Results CBC    Component Value Date/Time   WBC 7.9 02/04/2019 1337   RBC 4.64 02/04/2019 1337   HGB 13.7 02/04/2019 1337   HGB 15.6 06/23/2012 1028   HCT 41.3 02/04/2019 1337   HCT 44.3 06/23/2012 1028   PLT 246 02/04/2019 1337   PLT 174 06/23/2012 1028   MCV 89.0 02/04/2019 1337   MCV 93 06/23/2012 1028   MCH 29.5 02/04/2019 1337   MCHC 33.2 02/04/2019 1337   RDW 15.0 02/04/2019 1337   RDW 13.1 06/23/2012 1028   LYMPHSABS 1.0 11/14/2018 1122   MONOABS 1.3 (H) 11/14/2018 1122   EOSABS 0.0 11/14/2018 1122   BASOSABS 0.0 11/14/2018 1122    CMP Latest Ref Rng & Units 12/24/2018 12/23/2018 11/14/2018  Glucose 70 - 99 mg/dL 96 110(H) 78  BUN 8 - 23 mg/dL _0 Creatinine 0.61 - 1.24 mg/dL 0.90 1.06 1.10  Sodium 135 - 145 mmol/L 132(L) 136 132(L)  Potassium 3.5 - 5.1 mmol/L 3.7 4.2 3.9  Chloride 98 - 111 mmol/L 97(L) 100 96(L)  CO2 22 - 32 mmol/L _1 Calcium 8.9 - 10.3 mg/dL 8.8(L) 9.3 8.8(L)  Total Protein 6.5 - 8.1 g/dL - - -  Total Bilirubin 0.3 - 1.2 mg/dL - - -  Alkaline Phos 38 - 126 U/L - - -  AST 15 - 41 U/L - - -  ALT 0 - 44 U/L - - -      Microbiology: Sputum X 3- 01/20/19, 01/21/19 MAC  IMAGING  RESULTS:   I have personally reviewed the films Progressive consolidation of the right lung apex with areas of necrosis and new area of irregular opacity in the left lung apex, 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 Necrotizing right upper lobe pneumonia is also a possibility, though reportedly the patient has completed antibiotic therapy, further raising suspicion for malignancy. 2. Severe centrilobular emphysema.?      Impression/Recommendation  COPD with emphysema on inhalers and short course of steroids  Right upper lobe infiltrate with cavitation.  Started as nodules.  Malignancy has to be ruled out He is getting bronchoscopy tomorrow.    He has MAI in 3 sputums.  So with cavitation and infiltrate this will have to be treated like a pulmonary infection. Will need to r/o concomitant lung malignancy  As per the current guidelines he will need Quadruple therapy because of cavitating lesions Azithromycin 56m + Rifampin 6053m ethambutol 80057m amikacin IV ( for only 2 months) The oral meds will have to be given for a year from a negative sputum There are many sideeffects and Drug interactions which we discussed with aptient AleLockie Molae pharmacist was in the room and he discussed all the medications in great detail. We also spoke to his wife on the phone  PT is on simvastatin which will have to be changed to rosuvastatin or atorvastatin He will need to see ophthalmologist to check for baseline changes including optic neuritis    Abdominal aortic aneurysm more than 5 cm.  Awaiting endovascular repair. 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 Meanwhile I will discuss with his pulmonologist /cardiologist   ___________________________________________________ Discussed with patient, and his wife in detail  Note:  This document was prepared using Dragon voice recognition software and may include unintentional dictation errors.

## 2019-02-13 ENCOUNTER — Ambulatory Visit: Payer: Medicare Other

## 2019-02-13 ENCOUNTER — Ambulatory Visit
Admission: RE | Admit: 2019-02-13 | Discharge: 2019-02-13 | Disposition: A | Discharge status: Home / Self Care | Payer: Medicare Other | Attending: Pulmonary Disease | Admitting: Pulmonary Disease

## 2019-02-13 ENCOUNTER — Inpatient Hospital Stay: Admit: 2019-02-13 | Payer: Medicare Other

## 2019-02-13 ENCOUNTER — Encounter
Admission: RE | Disposition: A | Discharge status: Home / Self Care | Payer: Self-pay | Source: Home / Self Care | Attending: Pulmonary Disease

## 2019-02-13 ENCOUNTER — Ambulatory Visit: Payer: Medicare Other | Admitting: Certified Registered Nurse Anesthetist

## 2019-02-13 ENCOUNTER — Other Ambulatory Visit: Payer: Self-pay

## 2019-02-13 DIAGNOSIS — F329 Major depressive disorder, single episode, unspecified: Secondary | ICD-10-CM | POA: Diagnosis not present

## 2019-02-13 DIAGNOSIS — I712 Thoracic aortic aneurysm, without rupture: Secondary | ICD-10-CM | POA: Diagnosis not present

## 2019-02-13 DIAGNOSIS — J441 Chronic obstructive pulmonary disease with (acute) exacerbation: Secondary | ICD-10-CM | POA: Insufficient documentation

## 2019-02-13 DIAGNOSIS — R918 Other nonspecific abnormal finding of lung field: Secondary | ICD-10-CM | POA: Insufficient documentation

## 2019-02-13 DIAGNOSIS — G894 Chronic pain syndrome: Secondary | ICD-10-CM | POA: Insufficient documentation

## 2019-02-13 DIAGNOSIS — Z79899 Other long term (current) drug therapy: Secondary | ICD-10-CM | POA: Insufficient documentation

## 2019-02-13 DIAGNOSIS — I2510 Atherosclerotic heart disease of native coronary artery without angina pectoris: Secondary | ICD-10-CM | POA: Diagnosis not present

## 2019-02-13 DIAGNOSIS — J8410 Pulmonary fibrosis, unspecified: Secondary | ICD-10-CM | POA: Diagnosis not present

## 2019-02-13 DIAGNOSIS — I10 Essential (primary) hypertension: Secondary | ICD-10-CM | POA: Insufficient documentation

## 2019-02-13 DIAGNOSIS — G2581 Restless legs syndrome: Secondary | ICD-10-CM | POA: Diagnosis not present

## 2019-02-13 DIAGNOSIS — R590 Localized enlarged lymph nodes: Secondary | ICD-10-CM | POA: Diagnosis not present

## 2019-02-13 DIAGNOSIS — F419 Anxiety disorder, unspecified: Secondary | ICD-10-CM | POA: Diagnosis not present

## 2019-02-13 DIAGNOSIS — I252 Old myocardial infarction: Secondary | ICD-10-CM | POA: Insufficient documentation

## 2019-02-13 DIAGNOSIS — Z7951 Long term (current) use of inhaled steroids: Secondary | ICD-10-CM | POA: Insufficient documentation

## 2019-02-13 DIAGNOSIS — Z96641 Presence of right artificial hip joint: Secondary | ICD-10-CM | POA: Insufficient documentation

## 2019-02-13 DIAGNOSIS — Z8673 Personal history of transient ischemic attack (TIA), and cerebral infarction without residual deficits: Secondary | ICD-10-CM | POA: Diagnosis not present

## 2019-02-13 DIAGNOSIS — Z5309 Procedure and treatment not carried out because of other contraindication: Secondary | ICD-10-CM | POA: Diagnosis not present

## 2019-02-13 DIAGNOSIS — F17210 Nicotine dependence, cigarettes, uncomplicated: Secondary | ICD-10-CM | POA: Diagnosis not present

## 2019-02-13 HISTORY — PX: VIDEO BRONCHOSCOPY WITH ENDOBRONCHIAL NAVIGATION: SHX6175

## 2019-02-13 HISTORY — PX: VIDEO BRONCHOSCOPY WITH ENDOBRONCHIAL ULTRASOUND: SHX6177

## 2019-02-13 SURGERY — VIDEO BRONCHOSCOPY WITH ENDOBRONCHIAL NAVIGATION
Anesthesia: General

## 2019-02-13 MED ORDER — MIDAZOLAM HCL 2 MG/2ML IJ SOLN
INTRAMUSCULAR | Status: AC
  Filled 2019-02-13: qty 2

## 2019-02-13 MED ORDER — LABETALOL HCL 5 MG/ML IV SOLN
10.0000 mg | Freq: Once | INTRAVENOUS | Status: AC
  Administered 2019-02-13: 10 mg via INTRAVENOUS
  Filled 2019-02-13: qty 4

## 2019-02-13 MED ORDER — LABETALOL HCL 5 MG/ML IV SOLN
5.0000 mg | INTRAVENOUS | Status: AC
  Filled 2019-02-13: qty 4

## 2019-02-13 MED ORDER — LABETALOL HCL 5 MG/ML IV SOLN
INTRAVENOUS | Status: AC
  Filled 2019-02-13: qty 4

## 2019-02-13 MED ORDER — FENTANYL CITRATE (PF) 100 MCG/2ML IJ SOLN
INTRAMUSCULAR | Status: AC
  Filled 2019-02-13: qty 2

## 2019-02-13 MED ORDER — LABETALOL HCL 5 MG/ML IV SOLN
10.0000 mg | Freq: Once | INTRAVENOUS | Status: AC
  Administered 2019-02-13: 10 mg via INTRAVENOUS

## 2019-02-13 MED ORDER — LACTATED RINGERS IV SOLN: INTRAVENOUS | Status: DC

## 2019-02-13 MED ORDER — SODIUM CHLORIDE FLUSH 0.9 % IV SOLN
INTRAVENOUS | Status: AC
  Filled 2019-02-13: qty 20

## 2019-02-13 MED ORDER — LABETALOL HCL 5 MG/ML IV SOLN
5.0000 mg | Freq: Once | INTRAVENOUS | Status: AC
  Filled 2019-02-13: qty 4

## 2019-02-13 MED ORDER — LABETALOL HCL 5 MG/ML IV SOLN
INTRAVENOUS | Status: AC
  Administered 2019-02-13: 5 mg via INTRAVENOUS
  Filled 2019-02-13: qty 4

## 2019-02-13 NOTE — Anesthesia Preprocedure Evaluation (Addendum)
Anesthesia Evaluation  Patient identified by MRN, date of birth, ID band Patient awake    Reviewed: Allergy & Precautions, H&P , NPO status , Patient's Chart, lab work & pertinent test results, reviewed documented beta blocker date and time   Airway Mallampati: II  TM Distance: >3 FB Neck ROM: full    Dental  (+) Teeth Intact   Pulmonary shortness of breath and with exertion, asthma , COPD,  COPD inhaler, Current Smoker and Patient abstained from smoking.,    Pulmonary exam normal        Cardiovascular Exercise Tolerance: Poor hypertension, On Medications + CAD, + Past MI and + Peripheral Vascular Disease  Normal cardiovascular exam Rhythm:regular Rate:Normal     Neuro/Psych PSYCHIATRIC DISORDERS Anxiety Depression  Neuromuscular disease CVA    GI/Hepatic Neg liver ROS, GERD  ,  Endo/Other  negative endocrine ROS  Renal/GU negative Renal ROS  negative genitourinary   Musculoskeletal   Abdominal   Peds  Hematology negative hematology ROS (+)   Anesthesia Other Findings Past Medical History: No date: AAA (abdominal aortic aneurysm) without rupture ([place]) 2015: Abdominal aneurysm ([facility])     Comment:  being followed but not large enough to surgically treat No date: Alcoholism ([place]) No date: Anxiety No date: Arthritis No date: Asthma 07/16/2014: Benign hypertension 07/16/2014: Chest pain on exertion No date: Chronic pain syndrome 02/08/2012: Coccydynia No date: COPD (chronic obstructive pulmonary disease) ([facility])     Comment:  patient smokes No date: Coronary artery disease 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     Comment:  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 No date: Depression 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, thoracic No date: Dyspnea No date: Frequent falls No date: GERD (gastroesophageal reflux disease) No date: Hyperlipidemia No date: IBS (irritable bowel syndrome) 2010: Myocardial infarction ([place]) No date:  Neuropathy No date: Prostatitis No date: Restless leg syndrome No date: Sciatica No date: Spinal stenosis of lumbar region 2015: Stroke ([place])     Comment:  no residual symptoms No date: Tremor, essential Past Surgical History: 2012: CARDIAC CATHETERIZATION 11/14/2016: COLONOSCOPY WITH PROPOFOL; N/A     Comment:  Procedure: COLONOSCOPY WITH PROPOFOL;  Surgeon: Manya Silvas, MD;  Location: [facility] ENDOSCOPY;  Service:               Endoscopy;  Laterality: N/A; 11/14/2016: ESOPHAGOGASTRODUODENOSCOPY (EGD) WITH PROPOFOL; N/A     Comment:  Procedure: ESOPHAGOGASTRODUODENOSCOPY (EGD) WITH               PROPOFOL;  Surgeon: Manya Silvas, MD;  Location:               [facility] ENDOSCOPY;  Service: Endoscopy;  Laterality: N/A; No date: JOINT REPLACEMENT; Right     Comment:  hip No date: KNEE SURGERY     Comment:  right 08/20/2017: SHOULDER ARTHROSCOPY WITH OPEN ROTATOR CUFF REPAIR; Right     Comment:  Procedure: SHOULDER ARTHROSCOPY WITH OPEN ROTATOR CUFF               REPAIR;  Surgeon: Corky Mull, MD;  Location: [facility] ORS;              Service: Orthopedics;  Laterality: Right; 05/10/2016: SHOULDER ARTHROSCOPY WITH SUBACROMIAL DECOMPRESSION,  ROTATOR CUFF REPAIR AND BICEP TENDON REPAIR; Left     Comment:  Procedure: SHOULDER ARTHROSCOPY WITH DEBTRIDEMENT,               DECOMPRESSION, REPAIR OF  MASSIVE ROTATOR CUFF TEAR AND               BICEP TENODESIS;  Surgeon: Corky Mull, MD;  Location:               [facility] ORS;  Service: Orthopedics;  Laterality: Left;                Massive cuff tear 07/01/2018: TOTAL HIP ARTHROPLASTY; Right     Comment:  Procedure: TOTAL HIP ARTHROPLASTY - RIGHT;  Surgeon:               Corky Mull, MD;  Location: [facility] ORS;  Service:               Orthopedics;  Laterality: Right; BMI    Body Mass Index: 19.21 kg/m     Reproductive/Obstetrics negative OB ROS                             Anesthesia Physical Anesthesia  Plan  ASA: IV  Anesthesia Plan: General ETT   Post-op Pain Management:    Induction:   PONV Risk Score and Plan:   Airway Management Planned:   Additional Equipment:   Intra-op Plan:   Post-operative Plan:   Informed Consent: I have reviewed the patients History and Physical, chart, labs and discussed the procedure including the risks, benefits and alternatives for the proposed anesthesia with the patient or authorized representative who has indicated his/her understanding and acceptance.     Dental Advisory Given  Plan Discussed with: CRNA  Anesthesia Plan Comments: (Case ultimately cancelled secondary refractory bp issues and symptomatic headache with pending eval. For a 6 cm aneurysm.  JA)       Anesthesia Quick Evaluation

## 2019-02-13 NOTE — Progress Notes (Signed)
Pt noted with elevated blood pressure., also reported persistent H/A. Notified Dr. Andree Elk. Patient treated with labetalol 20mg  IV in divided doses. Please refer to [facility]. Patient continued with consistent elevated blood pressures. Surgical case cancelled today and to be rescheduled, however CT scan completed today as previously ordered for this case. Stacy aware and awaiting to speak to [employer] to reschedule. Patient was educated by MD and verbalized understanding. Patient returned from CT scan, V/S obtained- notified MD. Orders received, treat with IV labetalol 10mg  x 1 and okay to discharge, encourage patient to take home BP meds. Patient reported improvement in headache and verbalized understanding. Aware that office will call to reschedule.

## 2019-02-13 NOTE — H&P (Signed)
Pulmonary Medicine          Date: 02/13/2019,   MRN# [id number] Joseph Peters [facility] 1956/01/08     Admission                  Current       CHIEF COMPLAINT:   RUL chronic infiltrate with underlying mass possible malignancy   HISTORY OF PRESENT ILLNESS   63 year old male with past medical history of advanced combined pulmonary fibrosis and centrilobular bullous emphysema with hyperinflated lung volumes and grade 2D COPD with DLCO 45%, recent treatment for mild COPD exacerbation outpatient basis as well as CT chest and PET scan with concerning hypermetabolic right lung lesions and mediastinal lymphadenopathy suggestive of primary bronchogenic carcinoma.  He was seen by primary pulmonologist 09/29/2018 and was planned for possible EBUS assisted lymph node biopsies however decision was made for additional input from tumor board.  We have discussed these findings with patient and he had significant anxiety and wanted additional evaluation for possible lung cancer to initiate treatment as necessary.  Patient does have thoracic and abdominal aortic aneurysm with enlargement as well as multiple comorbid conditions including chronic pain syndrome, CAD, IBS, MI, restless leg syndrome, CVA, AAA, anxiety, depression, tobacco abuse, alcoholism, and hypertension.    He states that he came in due to worsening abdominal pain the right lower quadrant however reports increased phlegm with dark color orange to brown despite full compliance with his inhaler regimen.  He also describes episodes of vomiting due to nonstop phlegm production overnight.  Despite all this he continues to actively smoke however has cut down to 4 cigarettes/day.  And a lengthy discourse with him regarding smoking cessation today and he has made a promise that he will stop immediately.  On arrival to the ED, he was afebrile with blood pressure175/133m Hg and pulse rate 90beats/min. There were no focal neurological  deficits; he was alert and oriented x4.Initial labs revealed lipase 23, sodium 134, otherwise unremarkable CMP, slightly elevated WBC of 11.9 otherwise unremarkable. Urinalysis negative for UTI. CT angio chest abdomen and pelvis showed no acute PE, slightly enlarging 4.8 cm abdominal aortic aneurysm noted with progressive right renal artery occlusion. Chest x-ray shows right upper lobe consolidation also noted on CTA. Given enlarging aneurysm and persistent elevated blood pressure with signs of COPD exacerbations patient will be admitted for further management with pulmonary consultation placed by primary internal medicine provider for evaluation of acute COPD exacerbation and possible underlying malignancy.  Have briefly discussed his case with oncologist in place consultation to further guide [place] next steps due to high risk for anesthesia to obtain tissue specimen.  He has been seen by ID and had 1 sputum + for MAC with plan for additional plan for tx post bronch  We discussed plan for ENB/BAL/EBUS patient is agreeable    PAST MEDICAL HISTORY   Past Medical History:  Diagnosis Date  . AAA (abdominal aortic aneurysm) without rupture ([facility]   . Abdominal aneurysm ([facility] 2015   being followed but not large enough to surgically treat  . Alcoholism ([facility]   . Anxiety   . Arthritis   . Asthma   . Benign hypertension 07/16/2014  . Chest pain on exertion 07/16/2014  . Chronic pain syndrome   . Coccydynia 02/08/2012  . COPD (chronic obstructive pulmonary disease) ([facility]    patient smokes  . Coronary artery disease   . 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  Depression   . 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 Dyspnea   . Frequent falls   . GERD (gastroesophageal reflux disease)   . Hyperlipidemia   . IBS (irritable bowel syndrome)   . Myocardial infarction ([place]) 2010  . Neuropathy   . Prostatitis   . Restless leg syndrome   . Sciatica   . Spinal  stenosis of lumbar region   . Stroke ([place]) 2015   no residual symptoms  . Tremor, essential      SURGICAL HISTORY   Past Surgical History:  Procedure Laterality Date  . CARDIAC CATHETERIZATION  2012  . COLONOSCOPY WITH PROPOFOL N/A 11/14/2016   Procedure: COLONOSCOPY WITH PROPOFOL;  Surgeon: Manya Silvas, MD;  Location: [facility] ENDOSCOPY;  Service: Endoscopy;  Laterality: N/A;  . ESOPHAGOGASTRODUODENOSCOPY (EGD) WITH PROPOFOL N/A 11/14/2016   Procedure: ESOPHAGOGASTRODUODENOSCOPY (EGD) WITH PROPOFOL;  Surgeon: Manya Silvas, MD;  Location: [facility] ENDOSCOPY;  Service: Endoscopy;  Laterality: N/A;  . JOINT REPLACEMENT Right    hip  . KNEE SURGERY     right  . SHOULDER ARTHROSCOPY WITH OPEN ROTATOR CUFF REPAIR Right 08/20/2017   Procedure: SHOULDER ARTHROSCOPY WITH OPEN ROTATOR CUFF REPAIR;  Surgeon: Corky Mull, MD;  Location: [facility] ORS;  Service: Orthopedics;  Laterality: Right;  . SHOULDER ARTHROSCOPY WITH SUBACROMIAL DECOMPRESSION, ROTATOR CUFF REPAIR AND BICEP TENDON REPAIR Left 05/10/2016   Procedure: SHOULDER ARTHROSCOPY WITH DEBTRIDEMENT, DECOMPRESSION, REPAIR OF MASSIVE ROTATOR CUFF TEAR AND BICEP TENODESIS;  Surgeon: Corky Mull, MD;  Location: [facility] ORS;  Service: Orthopedics;  Laterality: Left;  Massive cuff tear  . TOTAL HIP ARTHROPLASTY Right 07/01/2018   Procedure: TOTAL HIP ARTHROPLASTY - RIGHT;  Surgeon: Corky Mull, MD;  Location: [facility] ORS;  Service: Orthopedics;  Laterality: Right;     FAMILY HISTORY   Family History  Problem Relation Age of Onset  . Cancer Mother   . COPD Father 41       black lung     SOCIAL HISTORY   Social History   Tobacco Use  . Smoking status: Light Tobacco Smoker    Packs/day: 0.50    Years: 30.00    Pack years: 15.00    Types: Cigarettes    Start date: 02/26/1973    Last attempt to quit: 09/21/2018    Years since quitting: 0.3  . Smokeless tobacco: Former Systems developer    Quit date: 02/26/1998  . Tobacco comment: smokes 2-3 cig a week  02/12/19  Substance Use Topics  . Alcohol use: Yes    Alcohol/week: 20.0 standard drinks    Types: 20 Cans of beer per week    Comment: week   . Drug use: No     MEDICATIONS    Home Medication:  Current Outpatient Rx  . Order #: [id number] Class: Historical Med  . Order #: [id number] Class: Print  . Order #: [id number] Class: Normal  . Order #: [id number] Class: Historical Med  . Order #: [id number] Class: Historical Med  . Order #: [id number] Class: Historical Med  . Order #: [id number] Class: Historical Med  . Order #: [id number] Class: Historical Med  . Order #: [id number] Class: Historical Med  . Order #: [id number] Class: Historical Med  . Order #: [id number] Class: Historical Med  . Order #: [id number] Class: Normal  . Order #: [id number] Class: Historical Med    Current Medication: No current facility-administered medications for this encounter.  Current Outpatient Medications:  .  albuterol (PROVENTIL HFA;VENTOLIN HFA) 108 (90 Base) MCG/ACT inhaler, Inhale 2 puffs into the lungs every 4 (  four) hours as needed for wheezing or shortness of breath. , Disp: , Rfl:  .  DALIRESP 250 MCG TABS, Take 250 mcg by mouth at bedtime., Disp: 30 tablet, Rfl: 0 .  finasteride (PROSCAR) 5 MG tablet, Take 1 tablet (5 mg total) by mouth daily., Disp: 90 tablet, Rfl: 3 .  FLUoxetine (PROZAC) 20 MG capsule, Take 20 mg by mouth daily. , Disp: , Rfl:  .  Fluticasone-Umeclidin-Vilant (TRELEGY ELLIPTA) 100-62.5-25 MCG/INH AEPB, Inhale 2 puffs into the lungs at bedtime. , Disp: , Rfl:  .  gabapentin (NEURONTIN) 300 MG capsule, Take 900 mg by mouth at bedtime. , Disp: , Rfl:  .  losartan (COZAAR) 25 MG tablet, Take 25 mg by mouth daily. , Disp: , Rfl:  .  montelukast (SINGULAIR) 10 MG tablet, Take 10 mg by mouth at bedtime., Disp: , Rfl:  .  omeprazole (PRILOSEC) 20 MG capsule, Take 20 mg by mouth daily., Disp: , Rfl:  .  Oxycodone HCl 10 MG TABS, Take 10 mg by mouth 4 (four) times daily as needed for pain., Disp: , Rfl:    .  simvastatin (ZOCOR) 40 MG tablet, Take 40 mg by mouth at bedtime. , Disp: , Rfl:  .  tamsulosin (FLOMAX) 0.4 MG CAPS capsule, Take 1 capsule (0.4 mg total) by mouth daily after supper., Disp: 90 capsule, Rfl: 1 .  Nebulizers (COMP AIR COMPRESSOR NEBULIZER) MISC, USE AS DIRECTED, Disp: , Rfl:     ALLERGIES   Penicillins and Lidocaine     REVIEW OF SYSTEMS    Review of Systems:  Gen:  Denies  fever, sweats, chills weigh loss  HEENT: Denies blurred vision, double vision, ear pain, eye pain, hearing loss, nose bleeds, sore throat Cardiac:  No dizziness, chest pain or heaviness, chest tightness,edema Resp:   Denies cough or sputum porduction, shortness of breath,wheezing, hemoptysis,  Gi: Denies swallowing difficulty, stomach pain, nausea or vomiting, diarrhea, constipation, bowel incontinence Gu:  Denies bladder incontinence, burning urine Ext:   Denies Joint pain, stiffness or swelling Skin: Denies  skin rash, easy bruising or bleeding or hives Endoc:  Denies polyuria, polydipsia , polyphagia or weight change Psych:   Denies depression, insomnia or hallucinations   Other:  All other systems negative   VS: There were no vitals taken for this visit.     PHYSICAL EXAM    GENERAL:NAD, no fevers, chills, no weakness no fatigue HEAD: Normocephalic, atraumatic.  EYES: Pupils equal, round, reactive to light. Extraocular muscles intact. No scleral icterus.  MOUTH: Moist mucosal membrane. Dentition intact. No abscess noted.  EAR, NOSE, THROAT: Clear without exudates. No external lesions.  NECK: Supple. No thyromegaly. No nodules. No JVD.  PULMONARY: Diffuse coarse rhonchi right sided +wheezes CARDIOVASCULAR: S1 and S2. Regular rate and rhythm. No murmurs, rubs, or gallops. No edema. Pedal pulses 2+ bilaterally.  GASTROINTESTINAL: Soft, nontender, nondistended. No masses. Positive bowel sounds. No hepatosplenomegaly.  MUSCULOSKELETAL: No swelling, clubbing, or edema. Range of  motion full in all extremities.  NEUROLOGIC: Cranial nerves II through XII are intact. No gross focal neurological deficits. Sensation intact. Reflexes intact.  SKIN: No ulceration, lesions, rashes, or cyanosis. Skin warm and dry. Turgor intact.  PSYCHIATRIC: Mood, affect within normal limits. The patient is awake, alert and oriented x 3. Insight, judgment intact.       IMAGING    No results found.    ASSESSMENT/PLAN    Moderate acute exacerbation of COPD -COPD care path   Spiculated right lung nodule  and ipsilateral satellite lesion with PET avid hilar lymphadenopathy -High pretest probability of lung malignancy -ENB/EBUS discussed patient wishes to proceed - complications include - death, bleeding, infection, pneumothorax/pneumomediastinum which may require chest tube and/or prolonged endotracheal intubation with mechanical ventialtion , patient reviewed and agrees to proceed         Thank you for allowing me to participate in the care of this patient.   Patient/Family are satisfied with care plan and all questions have been answered.  This document was prepared using Dragon voice recognition software and may include unintentional dictation errors.     Ottie Glazier, M.D.  Division of [place]

## 2019-02-24 ENCOUNTER — Ambulatory Visit: Payer: Medicare Other | Admitting: Infectious Diseases

## 2019-02-24 ENCOUNTER — Ambulatory Visit: Payer: Medicare Other | Attending: Infectious Diseases | Admitting: Infectious Diseases

## 2019-02-24 ENCOUNTER — Other Ambulatory Visit: Payer: Self-pay

## 2019-02-24 DIAGNOSIS — A310 Pulmonary mycobacterial infection: Secondary | ICD-10-CM | POA: Diagnosis not present

## 2019-02-24 NOTE — Progress Notes (Signed)
The purpose of this virtual visit is to provide medical care while limiting exposure to the novel coronavirus (COVID19) for both patient and office staff.   Consent was obtained for phone visit:  Yes.   Answered questions that patient had about telehealth interaction:  Yes.   I discussed the limitations, risks, security and privacy concerns of performing an evaluation and management service by telephone. I also discussed with the patient that there may be a patient responsible charge related to this service. The patient expressed understanding and agreed to proceed.   Patient Location: Home Provider Location:office  To discuss the treatment plan for MAC lung after bronchsocpy which was scheduled for l2/18/20. The Bronchoscopy was canceled because of high BP. As per patient 's wife he was told not to take his BP meds before the procedure and that could have been the reaso it was high! The bronchoscopy and biopsy would have ruled out or confirmed malignancy. He has 3 sputums positive for MAC and has ac avitary lesion in the rt lung Current guidelines for MAC treatment is a 4 drug regimen of azithromycin, rifampin , ethambutol and Iv amikacin- The latter given for the first 2 months Pt is on simvastatin and that will need to be changed to rosuvastatin or atorvastatin Also needs to get opthalomology evaluation - Discussed this with patient and his wife

## 2019-03-02 ENCOUNTER — Other Ambulatory Visit: Payer: Self-pay

## 2019-03-02 DIAGNOSIS — R310 Gross hematuria: Secondary | ICD-10-CM

## 2019-03-02 MED ORDER — FINASTERIDE 5 MG PO TABS: 5.0000 mg | ORAL_TABLET | Freq: Every day | ORAL | 0 refills | Status: AC

## 2019-03-04 ENCOUNTER — Other Ambulatory Visit: Payer: Self-pay

## 2019-03-04 ENCOUNTER — Encounter
Admission: RE | Admit: 2019-03-04 | Discharge: 2019-03-04 | Disposition: A | Discharge status: Home / Self Care | Payer: Medicare Other | Source: Ambulatory Visit | Attending: Pulmonary Disease | Admitting: Pulmonary Disease

## 2019-03-04 HISTORY — DX: Pneumonia, unspecified organism: J18.9

## 2019-03-04 NOTE — Pre-Procedure Instructions (Signed)
Per patient, his ID physician instructed him to take his Losartan the morning of procedure.  Patient's procedure was cx previously d/t elevated BP.  Confirmed w/ anesthesiology (Dr. Vashti Hey).  Patient aware okay to take Losartan DOS from anesthesia perspective.  No further questions.

## 2019-03-04 NOTE — Patient Instructions (Addendum)
Your COVID test is scheduled on: Wednesday 03/11/19.  Drive up in front of [place] between 8:00 and 10:30 am and remain in your vehicle.  Your procedure is scheduled on: Friday 03/13/19 Report to Same Day Surgery 2nd floor [facility] Entrance-take elevator on left to 2nd floor.  Check in with surgery information desk.) To find out your arrival time, call [phone] 1:00-3:00 PM on Thursday 03/12/19  Remember: Instructions that are not followed completely may result in serious medical risk, up to and including death, or upon the discretion of your surgeon and anesthesiologist your surgery may need to be rescheduled.   __x__ 1. Do not eat food (including mints, candies, chewing gum) after midnight the night before your procedure. You may drink water up to 2 hours before you are scheduled to arrive at the hospital for your procedure.  Do not drink anything within 2 hours of your scheduled arrival to the hospital.   __x__ 2. No Alcohol or smoking for 24 hours before or after surgery.  __x__ 3. Notify your doctor if there is any change in your medical condition (cold, fever, infections).  __x__ 4. On the morning of surgery brush your teeth with toothpaste and water.  You may rinse your mouth with mouthwash if you wish.  Do not swallow any toothpaste or mouthwash.  __x__ Use antibacterial soap such as Dial to wash your face and shower/bathe on the day of surgery.   Do not wear jewelry, lotions, powders, deodorant or perfumes on the day of surgery.  Do not shave below your neckline for 48 hours prior to surgery.   Do not bring valuables to the hospital.  [facility] is not responsible for any belongings or valuables.               Eyeglasses, dentures or bridgework may not be worn into surgery.  For patients discharged on the day of surgery, you will NOT be permitted to drive yourself home.  You must have a responsible adult with you for 24 hours after surgery.  __x__  Take these medicines on the morning of surgery with a SMALL SIP OF WATER:  1.  Fluoxetine (Prozac)   2.  Omeprazole (Prilosec)  3.  Finasteride (Proscar)  4.  LOSARTAN (COZAAR)  __x__ Use inhaler (Albuterol) on the day of surgery and bring with you to the hospital.  __x__ Continue to take your evening medicines as normal, including the evening before surgery.  ____ If you take any blood thinners, follow recommendations from Cardiologist, Pulmonologist or PCP regarding stopping them before surgery Aspirin, Coumadin, Plavix, Eliquis, Effient, Pradaxa, and Pletal.  __x__ STARTING TODAY UNTIL THE DAY OF SURGERY: Do not take any Anti-inflammatories such as Advil, Ibuprofen, Motrin, Aleve, Naproxen, Naprosyn, BC/Goodies powders or aspirin products. You may continue to take Tylenol as needed.   __x__ STARTING TODAY UNTIL THE DAY OF SURGERY: Do not take any over the counter supplements.

## 2019-03-05 ENCOUNTER — Telehealth: Payer: Self-pay

## 2019-03-05 NOTE — Telephone Encounter (Signed)
LM at home and with wife.

## 2019-03-05 NOTE — Telephone Encounter (Signed)
-----   Message from Tsosie Billing, MD sent at 03/05/2019 10:10 AM EST ----- Please call patient/wife to find out whether he has a PCP and name of PCP and did he see the PCP for changing his chloesterol medicine? 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 I have sent a message to his cardiologist Dr.Fath as well. If he does not have a PCP he will have to contact his cardioligist to change the simvastatin to another med like rosuvastatin. Also he will have to see an eye doctor as he will be starting this antibiotic called ethambutol and we need a bseline assessment of the vision, color vision and r/o optic neuritis

## 2019-03-06 ENCOUNTER — Ambulatory Visit: Payer: Medicare Other | Admitting: Urology

## 2019-03-09 ENCOUNTER — Telehealth: Payer: Self-pay

## 2019-03-09 NOTE — Telephone Encounter (Signed)
-----   Message from Tsosie Billing, MD sent at 03/05/2019 10:10 AM EST ----- Please call patient/wife to find out whether he has a PCP and name of PCP and did he see the PCP for changing his chloesterol medicine? 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 I have sent a message to his cardiologist Dr.Fath as well. If he does not have a PCP he will have to contact his cardioligist to change the simvastatin to another med like rosuvastatin. Also he will have to see an eye doctor as he will be starting this antibiotic called ethambutol and we need a bseline assessment of the vision, color vision and r/o optic neuritis

## 2019-03-09 NOTE — Telephone Encounter (Signed)
Lm x 3

## 2019-03-09 NOTE — Telephone Encounter (Signed)
LM X 2 BOTH NUMBERS

## 2019-03-10 NOTE — Telephone Encounter (Addendum)
Spoke to wife. Patient has appt coming up with PCP- [facility] Dr. Janene Harvey. Also saw eye doctor and is dropping off the studies that they said cover all your needs to R/O Optic neuritis and show vision tests. They will come by soon to drop off and said they will disucss cholesterol meds when they see PCP for NP visit coming up later in week.

## 2019-03-10 NOTE — Telephone Encounter (Signed)
Thanks

## 2019-03-11 ENCOUNTER — Other Ambulatory Visit: Payer: Self-pay

## 2019-03-11 ENCOUNTER — Other Ambulatory Visit
Admission: RE | Admit: 2019-03-11 | Discharge: 2019-03-11 | Disposition: A | Discharge status: Home / Self Care | Payer: Medicare Other | Source: Ambulatory Visit | Attending: Pulmonary Disease | Admitting: Pulmonary Disease

## 2019-03-11 DIAGNOSIS — Z20822 Contact with and (suspected) exposure to covid-19: Secondary | ICD-10-CM | POA: Insufficient documentation

## 2019-03-11 DIAGNOSIS — Z01812 Encounter for preprocedural laboratory examination: Secondary | ICD-10-CM | POA: Insufficient documentation

## 2019-03-11 LAB — SARS CORONAVIRUS 2 (TAT 6-24 HRS): SARS Coronavirus 2: NEGATIVE

## 2019-03-12 ENCOUNTER — Telehealth: Payer: Self-pay

## 2019-03-12 NOTE — Telephone Encounter (Signed)
LM asking him to call back regarding his coming by office yesterday. Abigail Butts reported patient was upset and mentioned a form he needs filled out by Friday. We have no forms and have no clearance rights for his Pulm. Procedure Friday.

## 2019-03-13 ENCOUNTER — Ambulatory Visit: Payer: Medicare Other

## 2019-03-13 ENCOUNTER — Other Ambulatory Visit: Payer: Self-pay

## 2019-03-13 ENCOUNTER — Ambulatory Visit: Payer: Medicare Other | Admitting: Certified Registered Nurse Anesthetist

## 2019-03-13 ENCOUNTER — Encounter
Admission: RE | Disposition: A | Discharge status: Home / Self Care | Payer: Self-pay | Source: Home / Self Care | Attending: Pulmonary Disease

## 2019-03-13 ENCOUNTER — Ambulatory Visit
Admission: RE | Admit: 2019-03-13 | Discharge: 2019-03-13 | Disposition: A | Discharge status: Home / Self Care | Payer: Medicare Other | Attending: Pulmonary Disease | Admitting: Pulmonary Disease

## 2019-03-13 DIAGNOSIS — K219 Gastro-esophageal reflux disease without esophagitis: Secondary | ICD-10-CM | POA: Insufficient documentation

## 2019-03-13 DIAGNOSIS — I2510 Atherosclerotic heart disease of native coronary artery without angina pectoris: Secondary | ICD-10-CM | POA: Insufficient documentation

## 2019-03-13 DIAGNOSIS — F17210 Nicotine dependence, cigarettes, uncomplicated: Secondary | ICD-10-CM | POA: Insufficient documentation

## 2019-03-13 DIAGNOSIS — G2581 Restless legs syndrome: Secondary | ICD-10-CM | POA: Diagnosis not present

## 2019-03-13 DIAGNOSIS — M48061 Spinal stenosis, lumbar region without neurogenic claudication: Secondary | ICD-10-CM | POA: Insufficient documentation

## 2019-03-13 DIAGNOSIS — J850 Gangrene and necrosis of lung: Secondary | ICD-10-CM | POA: Diagnosis not present

## 2019-03-13 DIAGNOSIS — F1020 Alcohol dependence, uncomplicated: Secondary | ICD-10-CM | POA: Insufficient documentation

## 2019-03-13 DIAGNOSIS — R911 Solitary pulmonary nodule: Secondary | ICD-10-CM | POA: Insufficient documentation

## 2019-03-13 DIAGNOSIS — I714 Abdominal aortic aneurysm, without rupture: Secondary | ICD-10-CM | POA: Insufficient documentation

## 2019-03-13 DIAGNOSIS — F419 Anxiety disorder, unspecified: Secondary | ICD-10-CM | POA: Insufficient documentation

## 2019-03-13 DIAGNOSIS — I252 Old myocardial infarction: Secondary | ICD-10-CM | POA: Insufficient documentation

## 2019-03-13 DIAGNOSIS — Z8673 Personal history of transient ischemic attack (TIA), and cerebral infarction without residual deficits: Secondary | ICD-10-CM | POA: Insufficient documentation

## 2019-03-13 DIAGNOSIS — E785 Hyperlipidemia, unspecified: Secondary | ICD-10-CM | POA: Diagnosis not present

## 2019-03-13 DIAGNOSIS — I739 Peripheral vascular disease, unspecified: Secondary | ICD-10-CM | POA: Insufficient documentation

## 2019-03-13 DIAGNOSIS — I10 Essential (primary) hypertension: Secondary | ICD-10-CM | POA: Diagnosis not present

## 2019-03-13 DIAGNOSIS — J439 Emphysema, unspecified: Secondary | ICD-10-CM | POA: Diagnosis not present

## 2019-03-13 DIAGNOSIS — G894 Chronic pain syndrome: Secondary | ICD-10-CM | POA: Insufficient documentation

## 2019-03-13 DIAGNOSIS — K589 Irritable bowel syndrome without diarrhea: Secondary | ICD-10-CM | POA: Insufficient documentation

## 2019-03-13 DIAGNOSIS — J8410 Pulmonary fibrosis, unspecified: Secondary | ICD-10-CM | POA: Insufficient documentation

## 2019-03-13 DIAGNOSIS — J984 Other disorders of lung: Secondary | ICD-10-CM

## 2019-03-13 DIAGNOSIS — F329 Major depressive disorder, single episode, unspecified: Secondary | ICD-10-CM | POA: Insufficient documentation

## 2019-03-13 HISTORY — PX: VIDEO BRONCHOSCOPY WITH ENDOBRONCHIAL NAVIGATION: SHX6175

## 2019-03-13 HISTORY — PX: VIDEO BRONCHOSCOPY WITH ENDOBRONCHIAL ULTRASOUND: SHX6177

## 2019-03-13 LAB — COMPREHENSIVE METABOLIC PANEL
ALT: 8 U/L (ref 0–44)
AST: 17 U/L (ref 15–41)
Albumin: 3.7 g/dL (ref 3.5–5.0)
Alkaline Phosphatase: 96 U/L (ref 38–126)
Anion gap: 11 (ref 5–15)
BUN: 12 mg/dL (ref 8–23)
CO2: 23 mmol/L (ref 22–32)
Calcium: 8.6 mg/dL — ABNORMAL LOW (ref 8.9–10.3)
Chloride: 100 mmol/L (ref 98–111)
Creatinine, Ser: 0.87 mg/dL (ref 0.61–1.24)
GFR calc Af Amer: >60 mL/min (ref >60)
GFR calc non Af Amer: >60 mL/min (ref >60)
Glucose, Bld: 72 mg/dL (ref 70–99)
Potassium: 3.7 mmol/L (ref 3.5–5.1)
Sodium: 134 mmol/L — ABNORMAL LOW (ref 135–145)
Total Bilirubin: 1 mg/dL (ref 0.3–1.2)
Total Protein: 7.5 g/dL (ref 6.5–8.1)

## 2019-03-13 LAB — KOH PREP: KOH Prep: NONE SEEN

## 2019-03-13 SURGERY — VIDEO BRONCHOSCOPY WITH ENDOBRONCHIAL NAVIGATION
Anesthesia: General

## 2019-03-13 MED ORDER — SUGAMMADEX SODIUM 200 MG/2ML IV SOLN
INTRAVENOUS | Status: DC | PRN
  Administered 2019-03-13: 200 mg via INTRAVENOUS

## 2019-03-13 MED ORDER — IPRATROPIUM-ALBUTEROL 0.5-2.5 (3) MG/3ML IN SOLN: 3.0000 mL | Freq: Once | RESPIRATORY_TRACT | Status: AC

## 2019-03-13 MED ORDER — SUGAMMADEX SODIUM 200 MG/2ML IV SOLN
INTRAVENOUS | Status: AC
  Filled 2019-03-13: qty 2

## 2019-03-13 MED ORDER — FENTANYL CITRATE (PF) 100 MCG/2ML IJ SOLN
INTRAMUSCULAR | Status: AC
  Filled 2019-03-13: qty 2

## 2019-03-13 MED ORDER — ROCURONIUM BROMIDE 100 MG/10ML IV SOLN
INTRAVENOUS | Status: DC | PRN
  Administered 2019-03-13: 10 mg via INTRAVENOUS
  Administered 2019-03-13: 40 mg via INTRAVENOUS

## 2019-03-13 MED ORDER — PHENYLEPHRINE HCL 0.25 % NA SOLN
1.0000 | Freq: Four times a day (QID) | NASAL | Status: DC | PRN
  Filled 2019-03-13: qty 15

## 2019-03-13 MED ORDER — BUTAMBEN-TETRACAINE-BENZOCAINE 2-2-14 % EX AERO
1.0000 | INHALATION_SPRAY | Freq: Once | CUTANEOUS | Status: DC
  Filled 2019-03-13: qty 20

## 2019-03-13 MED ORDER — PHENYLEPHRINE HCL (PRESSORS) 10 MG/ML IV SOLN
INTRAVENOUS | Status: DC | PRN
  Administered 2019-03-13 (×4): 100 ug via INTRAVENOUS
  Administered 2019-03-13: 200 ug via INTRAVENOUS
  Administered 2019-03-13 (×2): 100 ug via INTRAVENOUS
  Administered 2019-03-13: 200 ug via INTRAVENOUS

## 2019-03-13 MED ORDER — FENTANYL CITRATE (PF) 100 MCG/2ML IJ SOLN: 25.0000 ug | INTRAMUSCULAR | Status: DC | PRN

## 2019-03-13 MED ORDER — EPHEDRINE SULFATE 50 MG/ML IJ SOLN: INTRAMUSCULAR | Status: DC | PRN

## 2019-03-13 MED ORDER — LACTATED RINGERS IV SOLN: INTRAVENOUS | Status: DC

## 2019-03-13 MED ORDER — ONDANSETRON HCL 4 MG/2ML IJ SOLN
INTRAMUSCULAR | Status: DC | PRN
  Administered 2019-03-13: 4 mg via INTRAVENOUS

## 2019-03-13 MED ORDER — SODIUM CHLORIDE (PF) 0.9 % IJ SOLN
INTRAMUSCULAR | Status: AC
  Filled 2019-03-13: qty 10

## 2019-03-13 MED ORDER — FENTANYL CITRATE (PF) 100 MCG/2ML IJ SOLN
INTRAMUSCULAR | Status: DC | PRN
  Administered 2019-03-13 (×2): 50 ug via INTRAVENOUS

## 2019-03-13 MED ORDER — LABETALOL HCL 5 MG/ML IV SOLN
10.0000 mg | Freq: Once | INTRAVENOUS | Status: AC
  Filled 2019-03-13: qty 4

## 2019-03-13 MED ORDER — MIDAZOLAM HCL 2 MG/2ML IJ SOLN
INTRAMUSCULAR | Status: DC | PRN
  Administered 2019-03-13: 1 mg via INTRAVENOUS

## 2019-03-13 MED ORDER — KETOROLAC TROMETHAMINE 30 MG/ML IJ SOLN
INTRAMUSCULAR | Status: DC | PRN
  Administered 2019-03-13: 30 mg via INTRAVENOUS

## 2019-03-13 MED ORDER — DEXAMETHASONE SODIUM PHOSPHATE 10 MG/ML IJ SOLN
INTRAMUSCULAR | Status: AC
  Filled 2019-03-13: qty 1

## 2019-03-13 MED ORDER — MIDAZOLAM HCL 2 MG/2ML IJ SOLN
INTRAMUSCULAR | Status: AC
  Filled 2019-03-13: qty 2

## 2019-03-13 MED ORDER — EPHEDRINE SULFATE 50 MG/ML IJ SOLN
INTRAMUSCULAR | Status: DC | PRN
  Administered 2019-03-13 (×2): 5 mg via INTRAVENOUS

## 2019-03-13 MED ORDER — EPHEDRINE SULFATE 50 MG/ML IJ SOLN
INTRAMUSCULAR | Status: AC
  Filled 2019-03-13: qty 1

## 2019-03-13 MED ORDER — PROPOFOL 10 MG/ML IV BOLUS
INTRAVENOUS | Status: AC
  Filled 2019-03-13: qty 20

## 2019-03-13 MED ORDER — LABETALOL HCL 5 MG/ML IV SOLN
INTRAVENOUS | Status: AC
  Administered 2019-03-13: 10 mg via INTRAVENOUS
  Filled 2019-03-13: qty 4

## 2019-03-13 MED ORDER — DEXAMETHASONE SODIUM PHOSPHATE 10 MG/ML IJ SOLN
INTRAMUSCULAR | Status: DC | PRN
  Administered 2019-03-13: 5 mg via INTRAVENOUS

## 2019-03-13 MED ORDER — IPRATROPIUM-ALBUTEROL 0.5-2.5 (3) MG/3ML IN SOLN
RESPIRATORY_TRACT | Status: AC
  Administered 2019-03-13: 3 mL via RESPIRATORY_TRACT
  Filled 2019-03-13: qty 3

## 2019-03-13 MED ORDER — SODIUM CHLORIDE 0.9 % IV SOLN
INTRAVENOUS | Status: DC | PRN
  Administered 2019-03-13: 40 ug/min via INTRAVENOUS

## 2019-03-13 MED ORDER — PROPOFOL 10 MG/ML IV BOLUS
INTRAVENOUS | Status: DC | PRN
  Administered 2019-03-13: 10 mg via INTRAVENOUS
  Administered 2019-03-13: 30 mg via INTRAVENOUS
  Administered 2019-03-13: 70 mg via INTRAVENOUS

## 2019-03-13 MED ORDER — ONDANSETRON HCL 4 MG/2ML IJ SOLN
INTRAMUSCULAR | Status: AC
  Filled 2019-03-13: qty 2

## 2019-03-13 MED ORDER — LIDOCAINE HCL (CARDIAC) PF 100 MG/5ML IV SOSY
PREFILLED_SYRINGE | INTRAVENOUS | Status: DC | PRN
  Administered 2019-03-13: 80 mg via INTRAVENOUS

## 2019-03-13 MED ORDER — ONDANSETRON HCL 4 MG/2ML IJ SOLN: 4.0000 mg | Freq: Once | INTRAMUSCULAR | Status: DC | PRN

## 2019-03-13 NOTE — Discharge Instructions (Signed)
Flexible Bronchoscopy, Care After This sheet gives you information about how to care for yourself after your test. Your doctor may also give you more specific instructions. If you have problems or questions, contact your doctor. Follow these instructions at home: Eating and drinking  Do not eat or drink anything (not even water) for 2 hours after your test, or until your numbing medicine (local anesthetic) wears off.  When your numbness is gone and your cough and gag reflexes have come back, you may: ? Eat only soft foods. ? Slowly drink liquids.  The day after the test, go back to your normal diet. Driving  Do not drive for 24 hours if you were given a medicine to help you relax (sedative).  Do not drive or use heavy machinery while taking prescription pain medicine. General instructions   Take over-the-counter and prescription medicines only as told by your doctor.  Return to your normal activities as told. Ask what activities are safe for you.  Do not use any products that have nicotine or tobacco in them. This includes cigarettes and e-cigarettes. If you need help quitting, ask your doctor.  Keep all follow-up visits as told by your doctor. This is important. It is very important if you had a tissue sample (biopsy) taken. Get help right away if:  You have shortness of breath that gets worse.  You get light-headed.  You feel like you are going to pass out (faint).  You have chest pain.  You cough up: ? More than a little blood. ? More blood than before. Summary  Do not eat or drink anything (not even water) for 2 hours after your test, or until your numbing medicine wears off.  Do not use cigarettes. Do not use e-cigarettes.  Get help right away if you have chest pain. This information is not intended to replace advice given to you by your health care provider. Make sure you discuss any questions you have with your health care provider. Document Revised: 01/25/2017  Document Reviewed: 03/02/2016 Elsevier Patient Education  2020 [place]. Please call Dr. Teodoro Kil office Monday to schedule a 7-day follow up appointment to discuss results.  AMBULATORY SURGERY  DISCHARGE INSTRUCTIONS   1) The drugs that you were given will stay in your system until tomorrow so for the next 24 hours you should not:  A) Drive an automobile B) Make any legal decisions C) Drink any alcoholic beverage   2) You may resume regular meals tomorrow.  Today it is better to start with liquids and gradually work up to solid foods.  You may eat anything you prefer, but it is better to start with liquids, then soup and crackers, and gradually work up to solid foods.   3) Please notify your doctor immediately if you have any unusual bleeding, trouble breathing, redness and pain at the surgery site, drainage, fever, or pain not relieved by medication.    4) Additional Instructions:        Please contact your physician with any problems or Same Day Surgery at [phone], Monday through Friday 6 am to 4 pm, or [place] at [facility] number at [phone].

## 2019-03-13 NOTE — Transfer of Care (Signed)
Immediate Anesthesia Transfer of Care Note  Patient: Joseph Peters  Procedure(s) Performed: VIDEO BRONCHOSCOPY WITH ENDOBRONCHIAL NAVIGATION (N/A ) VIDEO BRONCHOSCOPY WITH ENDOBRONCHIAL ULTRASOUND (N/A )  Patient Location: PACU  Anesthesia Type:General  Level of Consciousness: awake, alert  and oriented  Airway & Oxygen Therapy: Patient Spontanous Breathing and Patient connected to face mask oxygen  Post-op Assessment: Report given to RN and Post -op Vital signs reviewed and stable  Post vital signs: Reviewed and stable  Last Vitals:  Vitals Value Taken Time  BP 135/80 03/13/19 1511  Temp 36.4 C 03/13/19 1511  Pulse 78 03/13/19 1511  Resp 11 03/13/19 1511  SpO2 100 % 03/13/19 1511    Last Pain:  Vitals:   03/13/19 1214  TempSrc: Tympanic  PainSc: 8          Complications: No apparent anesthesia complications

## 2019-03-13 NOTE — Anesthesia Procedure Notes (Signed)
Procedure Name: Intubation Date/Time: 03/13/2019 1:23 PM Performed by: Caryl Asp, CRNA Pre-anesthesia Checklist: Patient identified, Emergency Drugs available, Suction available and Patient being monitored Patient Re-evaluated:Patient Re-evaluated prior to induction Oxygen Delivery Method: Circle system utilized Preoxygenation: Pre-oxygenation with 100% oxygen Induction Type: IV induction Ventilation: Mask ventilation without difficulty Laryngoscope Size: McGraph and 4 Grade View: Grade I Tube type: Oral Tube size: 9.0 mm Number of attempts: 1 Airway Equipment and Method: Stylet and Oral airway Placement Confirmation: ETT inserted through vocal cords under direct vision,  positive ETCO2 and breath sounds checked- equal and bilateral Secured at: 21 cm Tube secured with: Tape Dental Injury: Teeth and Oropharynx as per pre-operative assessment

## 2019-03-13 NOTE — Anesthesia Preprocedure Evaluation (Signed)
Anesthesia Evaluation  Patient identified by MRN, date of birth, ID band Patient awake    Reviewed: Allergy & Precautions, H&P , NPO status , Patient's Chart, lab work & pertinent test results, reviewed documented beta blocker date and time   Airway Mallampati: II  TM Distance: >3 FB Neck ROM: full    Dental  (+) Teeth Intact   Pulmonary shortness of breath and with exertion, asthma , COPD,  COPD inhaler, Current Smoker and Patient abstained from smoking.,    Pulmonary exam normal        Cardiovascular Exercise Tolerance: Poor hypertension, On Medications + CAD, + Past MI and + Peripheral Vascular Disease  Normal cardiovascular exam Rhythm:regular Rate:Normal     Neuro/Psych PSYCHIATRIC DISORDERS Anxiety Depression  Neuromuscular disease CVA    GI/Hepatic Neg liver ROS, GERD  ,  Endo/Other  negative endocrine ROS  Renal/GU negative Renal ROS  negative genitourinary   Musculoskeletal   Abdominal   Peds  Hematology negative hematology ROS (+)   Anesthesia Other Findings Past Medical History: No date: AAA (abdominal aortic aneurysm) without rupture ([place]) 2015: Abdominal aneurysm (HCC)     Comment:  being followed but not large enough to surgically treat No date: Alcoholism ([place]) No date: Anxiety No date: Arthritis No date: Asthma 07/16/2014: Benign hypertension 07/16/2014: Chest pain on exertion No date: Chronic pain syndrome 02/08/2012: Coccydynia No date: COPD (chronic obstructive pulmonary disease) (HCC)     Comment:  patient smokes No date: Coronary artery disease 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     Comment:  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 No date: Depression 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, thoracic No date: Dyspnea No date: Frequent falls No date: GERD (gastroesophageal reflux disease) No date: Hyperlipidemia No date: IBS (irritable bowel syndrome) 2010: Myocardial infarction ([place]) No date:  Neuropathy No date: Prostatitis No date: Restless leg syndrome No date: Sciatica No date: Spinal stenosis of lumbar region 2015: Stroke ([place])     Comment:  no residual symptoms No date: Tremor, essential Past Surgical History: 2012: CARDIAC CATHETERIZATION 11/14/2016: COLONOSCOPY WITH PROPOFOL; N/A     Comment:  Procedure: COLONOSCOPY WITH PROPOFOL;  Surgeon: Manya Silvas, MD;  Location: [facility] ENDOSCOPY;  Service:               Endoscopy;  Laterality: N/A; 11/14/2016: ESOPHAGOGASTRODUODENOSCOPY (EGD) WITH PROPOFOL; N/A     Comment:  Procedure: ESOPHAGOGASTRODUODENOSCOPY (EGD) WITH               PROPOFOL;  Surgeon: Manya Silvas, MD;  Location:               [facility] ENDOSCOPY;  Service: Endoscopy;  Laterality: N/A; No date: JOINT REPLACEMENT; Right     Comment:  hip No date: KNEE SURGERY     Comment:  right 08/20/2017: SHOULDER ARTHROSCOPY WITH OPEN ROTATOR CUFF REPAIR; Right     Comment:  Procedure: SHOULDER ARTHROSCOPY WITH OPEN ROTATOR CUFF               REPAIR;  Surgeon: Corky Mull, MD;  Location: [facility] ORS;              Service: Orthopedics;  Laterality: Right; 05/10/2016: SHOULDER ARTHROSCOPY WITH SUBACROMIAL DECOMPRESSION,  ROTATOR CUFF REPAIR AND BICEP TENDON REPAIR; Left     Comment:  Procedure: SHOULDER ARTHROSCOPY WITH DEBTRIDEMENT,               DECOMPRESSION, REPAIR OF  MASSIVE ROTATOR CUFF TEAR AND               BICEP TENODESIS;  Surgeon: Corky Mull, MD;  Location:               [facility] ORS;  Service: Orthopedics;  Laterality: Left;                Massive cuff tear 07/01/2018: TOTAL HIP ARTHROPLASTY; Right     Comment:  Procedure: TOTAL HIP ARTHROPLASTY - RIGHT;  Surgeon:               Corky Mull, MD;  Location: [facility] ORS;  Service:               Orthopedics;  Laterality: Right; BMI    Body Mass Index: 19.21 kg/m     Reproductive/Obstetrics negative OB ROS                             Anesthesia Physical  Anesthesia  Plan  ASA: IV  Anesthesia Plan: General   Post-op Pain Management:    Induction:   PONV Risk Score and Plan:   Airway Management Planned: Oral ETT  Additional Equipment:   Intra-op Plan:   Post-operative Plan: Extubation in OR  Informed Consent: I have reviewed the patients History and Physical, chart, labs and discussed the procedure including the risks, benefits and alternatives for the proposed anesthesia with the patient or authorized representative who has indicated his/her understanding and acceptance.     Dental Advisory Given  Plan Discussed with: CRNA  Anesthesia Plan Comments: (Case ultimately cancelled secondary refractory bp issues and symptomatic headache with pending eval. For a 6 cm aneurysm.  JA)        Anesthesia Quick Evaluation

## 2019-03-13 NOTE — Procedures (Signed)
FIBEROPTIC BRONCHOSCOPY WITH BRONCHOALVEOLAR LAVAGE PROCEDURE NOTE  ENDOBRONCHIAL ULTRASOUND PROCEDURE NOTE  ELECTROMAGNETIC NAVIGATION BRONCHOSCOPY PROCEDURE NOTE  Flexible bronchoscopy was performed on 03/13/19 by : Lanney Gins MD   assistance by : 1)[employer] team  and 2)[employer] staff and 3) anesthesia team and 4) Frederich Cha RT   Indication for the procedure was :  Pre-procedural H&P. The following assessment was performed on the day of the procedure prior to initiating sedation History:  Chest pain n Dyspnea y Hemoptysis n Cough y Fever n Other pertinent items y  Examination Vital signs -reviewed as per nursing documentation today Cardiac    Murmurs: n  Rubs : n  Gallop: n Lungs Wheezing: n Rales : y Rhonchi :y  Other pertinent findings: accelerated HTN controlled by anesthesia team prior to start of procedure   Pre-procedural assessment for Procedural Sedation included: Depth of sedation: As per anesthesia team  ASA Classification:  2 Mallampati airway assessment: 2    Medication list reviewed: yes   The patient's interval history was taken and revealed: no new complaints The pre- procedure physical examination revealed: No new findings Refer to prior clinic note for details.  Informed Consent: Informed consent was obtained from:  patient after explanation of procedure and risks, benefits, as well as alternative procedures available.  Explanation of level of sedation and possible transfusion was also provided.    Procedural Preparation: Time out was performed and patient was identified by name and birthdate and procedure to be performed and side for sampling, if any, was specified. Pt was intubated by anesthesia.  The patient was appropriately draped.  Fiberoptic bronchoscopy Procedure Findings: Bronchoscope was inserted via ETT  without difficulty.  Posterior oropharynx, epiglottis, arytenoids, false cords and vocal cords were not visualized as  these were bypassed by endotracheal tube.   The distal trachea was normal in circumference and appearance without mucosal, cartilaginous or branching abnormalities.  The main carina was mildly splayed .   All right and left lobar airways were visualized to the Subsegmental level.  Sub- sub segmental carinae were identified in all the distal airways.   Secretions were visible in the following airways and appeared to be thickened phlegm.  The mucosa was : non-friable  Airways were notable for:        exophytic lesions :none        extrinsic compression in the following distributions: RUL anterior segment.       Friable mucosa: none        Anthrocotic material /pigmentation: none    Pictorial documentation attached: n/a      Broncho-alveolar lavage site:RUL   sent for cytology and microbiology                              2m volume infused 30 ml volume returned with serosanguineous fluid plus phlegm.  The fiberoptic bronchoscope was removed and the ENB scope was introduced.    Electromagnetic navigational bronchoscopy procedure findings:  After appropriate planning and registration target lesion was located on right upper lobe.  Under fluoroscopic guidance Cytobrush specimens X3 were obtained sent for pathologic evaluation and CytoLyt solution.  Next right upper lobe target lesion was biopsied with forceps x6, [employer] reported atypical cells in biopsies.  3 of the biopsies were sent in saline for microbiology studies.  Using LG guidance at target lesion and additional bronchoalveolar lavage was performed at this area for cytologic evaluation. Postprocedure diagnosis: Atypical cells suggestive of  malignancy, infectious component pending microbiologic studies       Endobronchial ultrasound-guided lymph node biopsies procedure findings:  Examination began to evaluate for pathologically enlarged lymph nodes starting on the left side progressing to the right in the following sequence:  11L>10L>4L>7>4R.  All lymph node biopsies performed with 21g needle Olympus ViziShotII. Lymph node biopsies were sent in cytolite for all sampled stations.  Starting on the left station 11 L was 0.7 cm and was not biopsied, next under ultrasound guidance station 10 L was approximately 1.2 cm and was biopsied 3 times with lymphoid shedding and without atypical cells found as per [employer].  Next station for L was evaluated under ultrasound was found to be 0.5 cm, this lymph node was not biopsied.  Station 7 was located next and was 2.25 cm this was biopsied 4 times with cellular lymphoid shedding but with no atypical cells noted by [employer].  Station 10 R was evaluated and found to be 1.5 cm in size this was biopsied 4 times and found to have atypical cells on 3 out of 4 biopsies. Postprocedure diagnosis: Atypical cells suggestive of malignancy  Fluoroscopy Used: Yes;           Immediate sampling complications included: None Epinephrine 0 ml was used topically  The bronchoscopy was terminated due to completion of the planned procedure and the bronchoscope was removed.   Total dosage of Lidocaine was 0 mg Total fluoroscopy time was 4 minutes    Estimated Blood loss: 2.5 cc.  Complications included: None immediate   Disposition: Home   Follow up with Dr. Lanney Gins  in 7 days for result discussion.     Claudette Stapler MD  [place] Division of Pulmonary & Critical Care Medicine

## 2019-03-13 NOTE — H&P (Signed)
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 He was seen by primary pulmonologist 09/29/2018 and was planned for possible EBUS assisted lymph node biopsies however decision was made for additional input from tumor board.We have discussed these findings with patient and he had significant anxiety and wanted additional evaluation for possible lung cancer to initiate treatment as necessary. Patient does have thoracic and abdominal aortic aneurysm with enlargement as well as multiple comorbid conditions including chronic pain syndrome, CAD, IBS, MI, restless leg syndrome, CVA, AAA, anxiety, depression, tobacco abuse, alcoholism, and hypertension.   He has been seen by ID and had 1 sputum + for MAC with plan for additional plan for tx post bronch  We discussed plan for ENB/BAL/EBUS patient is agreeable    PAST MEDICAL HISTORY   Past Medical History:  Diagnosis Date  . AAA (abdominal aortic aneurysm) without rupture ([place])   . Abdominal aneurysm ([place]) 2015   being followed but not large enough to surgically treat  . Alcoholism ([place])   . Anxiety   . Arthritis   . Asthma   . Benign hypertension 07/16/2014  . Chest pain on exertion 07/16/2014  . Chronic pain syndrome   . Coccydynia 02/08/2012  . COPD (chronic obstructive pulmonary disease)  ([place])    patient smokes  . Coronary artery disease   . 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 Depression   . 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 Dyspnea   . Frequent falls   . GERD (gastroesophageal reflux disease)   . Hyperlipidemia   . IBS (irritable bowel syndrome)   . Myocardial infarction ([place]) 2010  . Neuropathy   . Pneumonia   . Prostatitis   . Restless leg syndrome   . Sciatica   . Spinal stenosis of lumbar region   . Stroke ([place]) 2015   no residual symptoms  . Tremor, essential      SURGICAL HISTORY   Past Surgical History:  Procedure Laterality Date  . CARDIAC CATHETERIZATION  2012  . COLONOSCOPY WITH PROPOFOL N/A 11/14/2016   Procedure: COLONOSCOPY WITH PROPOFOL;  Surgeon: Manya Silvas, MD;  Location: [facility] ENDOSCOPY;  Service: Endoscopy;  Laterality: N/A;  . ESOPHAGOGASTRODUODENOSCOPY (EGD) WITH PROPOFOL N/A 11/14/2016   Procedure: ESOPHAGOGASTRODUODENOSCOPY (EGD) WITH PROPOFOL;  Surgeon: Manya Silvas, MD;  Location: [facility] ENDOSCOPY;  Service: Endoscopy;  Laterality: N/A;  . JOINT REPLACEMENT Right    hip  . KNEE SURGERY     right  . SHOULDER ARTHROSCOPY WITH OPEN ROTATOR CUFF REPAIR Right 08/20/2017   Procedure: SHOULDER ARTHROSCOPY WITH OPEN ROTATOR CUFF REPAIR;  Surgeon: Corky Mull, MD;  Location: [facility] ORS;  Service: Orthopedics;  Laterality: Right;  . SHOULDER ARTHROSCOPY WITH SUBACROMIAL DECOMPRESSION, ROTATOR CUFF REPAIR AND BICEP TENDON REPAIR Left 05/10/2016  Procedure: SHOULDER ARTHROSCOPY WITH DEBTRIDEMENT, DECOMPRESSION, REPAIR OF MASSIVE ROTATOR CUFF TEAR AND BICEP TENODESIS;  Surgeon: Corky Mull, MD;  Location: [facility] ORS;  Service: Orthopedics;  Laterality: Left;  Massive cuff tear  . TOTAL HIP ARTHROPLASTY Right 07/01/2018   Procedure: TOTAL HIP ARTHROPLASTY - RIGHT;  Surgeon: Corky Mull, MD;  Location: [facility] ORS;  Service: Orthopedics;  Laterality: Right;  Marland Kitchen VIDEO BRONCHOSCOPY WITH ENDOBRONCHIAL  NAVIGATION N/A 02/13/2019   Procedure: VIDEO BRONCHOSCOPY WITH ENDOBRONCHIAL NAVIGATION;  Surgeon: Ottie Glazier, MD;  Location: [facility] ORS;  Service: Thoracic;  Laterality: N/A;  . VIDEO BRONCHOSCOPY WITH ENDOBRONCHIAL ULTRASOUND N/A 02/13/2019   Procedure: VIDEO BRONCHOSCOPY WITH ENDOBRONCHIAL ULTRASOUND;  Surgeon: Ottie Glazier, MD;  Location: [facility] ORS;  Service: Thoracic;  Laterality: N/A;     FAMILY HISTORY   Family History  Problem Relation Age of Onset  . Cancer Mother   . COPD Father 66       black lung     SOCIAL HISTORY   Social History   Tobacco Use  . Smoking status: Light Tobacco Smoker    Packs/day: 0.50    Years: 30.00    Pack years: 15.00    Types: Cigarettes    Start date: 02/26/1973    Last attempt to quit: 09/21/2018    Years since quitting: 0.4  . Smokeless tobacco: Former Systems developer    Quit date: 02/26/1998  . Tobacco comment: smokes 2-3 cig a week 02/12/19  Substance Use Topics  . Alcohol use: Yes    Alcohol/week: 20.0 standard drinks    Types: 20 Cans of beer per week    Comment: week   . Drug use: No     MEDICATIONS    Home Medication:    Current Medication:  Current Facility-Administered Medications:  .  butamben-tetracaine-benzocaine (CETACAINE) spray 1 spray, 1 spray, Topical, Once, Ardis Lawley, MD .  lactated ringers infusion, , Intravenous, Continuous, Adams, Alvina Filbert, MD .  phenylephrine (NEO-SYNEPHRINE) 0.25 % nasal spray 1 spray, 1 spray, Each Nare, Q6H PRN, Ottie Glazier, MD    ALLERGIES   Penicillins and Lidocaine     REVIEW OF SYSTEMS    Review of Systems:  Gen:  Denies  fever, sweats, chills weigh loss  HEENT: Denies blurred vision, double vision, ear pain, eye pain, hearing loss, nose bleeds, sore throat Cardiac:  No dizziness, chest pain or heaviness, chest tightness,edema Resp:   Denies cough or sputum porduction, shortness of breath,wheezing, hemoptysis,  Gi: Denies swallowing difficulty, stomach pain, nausea  or vomiting, diarrhea, constipation, bowel incontinence Gu:  Denies bladder incontinence, burning urine Ext:   Denies Joint pain, stiffness or swelling Skin: Denies  skin rash, easy bruising or bleeding or hives Endoc:  Denies polyuria, polydipsia , polyphagia or weight change Psych:   Denies depression, insomnia or hallucinations   Other:  All other systems negative   VS: BP (!) 148/122   Pulse (!) 101   Temp (!) 97.3 F (36.3 C) (Tympanic)   Resp (!) 22   Ht _0  (1.651 m)   Wt 49.9 kg   SpO2 100%   BMI 18.30 kg/m      PHYSICAL EXAM    GENERAL:NAD, no fevers, chills, no weakness no fatigue HEAD: Normocephalic, atraumatic.  EYES: Pupils equal, round, reactive to light. Extraocular muscles intact. No scleral icterus.  MOUTH: Moist mucosal membrane. Dentition intact. No abscess noted.  EAR, NOSE, THROAT: Clear without exudates. No external lesions.  NECK: Supple. No thyromegaly. No nodules. No  JVD.  PULMONARY: Diffuse coarse rhonchi right sided +wheezes CARDIOVASCULAR: S1 and S2. Regular rate and rhythm. No murmurs, rubs, or gallops. No edema. Pedal pulses 2+ bilaterally.  GASTROINTESTINAL: Soft, nontender, nondistended. No masses. Positive bowel sounds. No hepatosplenomegaly.  MUSCULOSKELETAL: No swelling, clubbing, or edema. Range of motion full in all extremities.  NEUROLOGIC: Cranial nerves II through XII are intact. No gross focal neurological deficits. Sensation intact. Reflexes intact.  SKIN: No ulceration, lesions, rashes, or cyanosis. Skin warm and dry. Turgor intact.  PSYCHIATRIC: Mood, affect within normal limits. The patient is awake, alert and oriented x 3. Insight, judgment intact.       IMAGING    CT Super D Chest Wo Contrast  Result Date: 02/13/2019 CLINICAL DATA:  Preop for bronchoscopy. EXAM: CT CHEST WITHOUT CONTRAST TECHNIQUE: Multidetector CT imaging of the chest was performed using thin slice collimation for electromagnetic bronchoscopy planning  purposes, without intravenous contrast. COMPARISON:  Chest CT 12/24/2018 FINDINGS: Cardiovascular: The heart is normal in size. No pericardial effusion. Stable fusiform aneurysmal dilatation of the ascending thoracic aorta with maximum measurement of 4.3 cm on image 29/2 at the level of the right main pulmonary artery. Moderate atherosclerotic calcifications involving the descending thoracic aorta with a small focal saccular aneurysms involving the lateral and anterior walls distally. Mediastinum/Nodes: No mediastinal adenopathy. Mild fullness in the right hilar area but difficult to measure distinct lymph node without contrast. The esophagus is grossly normal. Lungs/Pleura: Severe emphysematous changes again demonstrated. Left apical scarring changes have contracted somewhat. There may have been some overlying inflammation or infection on the prior CT. Dense right upper lobe airspace consolidation with scattered air bronchograms. Associated loss of volume in the right upper lobe. Findings could be due to infection or mass. The upper lobe bronchus is patent. Stable nodularity in the lower aspect of the right upper lobe. The right middle lobe and right lower lobe are clear. No worrisome left lung lesions. Upper Abdomen: No significant upper abdominal findings. Advanced atherosclerotic calcifications involving the upper abdominal aorta. No adrenal gland lesions or obvious hepatic lesions. Musculoskeletal: No chest wall mass, supraclavicular or axillary adenopathy. The thyroid gland is grossly normal. No worrisome bone lesions. There is a stable mild midthoracic compression deformity. IMPRESSION: 1. Persistent and slightly progressive dense airspace consolidation in the right upper lobe. Infection versus tumor. 2. Improved left apical density. This is more linear in appearance and is probably post pneumonic scarring change. 3. No obvious mediastinal or hilar adenopathy. 4. Severe emphysematous changes and pulmonary  scarring. 5. 4.3 cm ascending thoracic aortic aneurysm Recommend annual imaging followup by CTA or MRA. This recommendation follows 2010 ACCF/AHA/AATS/ACR/ASA/SCA/SCAI/SIR/STS/SVM Guidelines for the Diagnosis and Management of Patients with Thoracic Aortic Disease. Circulation. 2010; 121: O130-Q657. Aortic aneurysm NOS (ICD10-I71.9) 6. Advanced three-vessel coronary artery calcifications. 7. Advanced atherosclerotic calcifications involving the descending thoracic aorta with 2 small saccular aneurysms. Aortic Atherosclerosis (ICD10-I70.0) and Emphysema (ICD10-J43.9). Electronically Signed   By: Marijo Sanes M.D.   On: 02/13/2019 15:15      ASSESSMENT/PLAN   Moderate acute exacerbation of COPD -COPD care path   Spiculated right lung nodule and ipsilateral satellite lesion with PET avid hilar lymphadenopathy -High pretest probability of lung malignancy -ENB/EBUS discussed patient wishes to proceed - complications include - death, bleeding, infection, pneumothorax/pneumomediastinum which may require chest tube and/or prolonged endotracheal intubation with mechanical ventialtion , patient reviewed and agrees to proceed      Thank you for allowing me to participate in the care of this patient.  Patient/Family are satisfied with care plan and all questions have been answered.  This document was prepared using Dragon voice recognition software and may include unintentional dictation errors.     Ottie Glazier, M.D.  Division of [place]

## 2019-03-13 NOTE — Anesthesia Postprocedure Evaluation (Signed)
Anesthesia Post Note  Patient: Joseph Peters  Procedure(s) Performed: VIDEO BRONCHOSCOPY WITH ENDOBRONCHIAL NAVIGATION (N/A ) VIDEO BRONCHOSCOPY WITH ENDOBRONCHIAL ULTRASOUND (N/A )  Patient location during evaluation: PACU Anesthesia Type: General Level of consciousness: awake and alert and oriented Pain management: pain level controlled Vital Signs Assessment: post-procedure vital signs reviewed and stable Respiratory status: spontaneous breathing Cardiovascular status: blood pressure returned to baseline Anesthetic complications: no     Last Vitals:  Vitals:   03/13/19 1541 03/13/19 1544  BP: 127/80   Pulse: 78 72  Resp: 15 16  Temp:    SpO2: 100% 99%    Last Pain:  Vitals:   03/13/19 1511  TempSrc:   PainSc: 0-No pain                 Rhiley Solem

## 2019-03-15 LAB — ACID FAST SMEAR (AFB, MYCOBACTERIA): Acid Fast Smear: NEGATIVE

## 2019-03-15 LAB — CULTURE, BAL-QUANTITATIVE W GRAM STAIN
Culture: NO GROWTH
Gram Stain: NONE SEEN

## 2019-03-16 LAB — CYTOLOGY - NON PAP

## 2019-03-17 LAB — SURGICAL PATHOLOGY

## 2019-03-19 LAB — ASPERGILLUS ANTIGEN, BAL/SERUM: Aspergillus Ag, BAL/Serum: 0.04 Index (ref 0.00–0.49)

## 2019-03-20 ENCOUNTER — Encounter: Payer: Self-pay | Admitting: Urology

## 2019-03-20 ENCOUNTER — Ambulatory Visit: Payer: Medicare Other | Admitting: Urology

## 2019-03-23 LAB — MAC SUSCEPTIBILITY BROTH

## 2019-03-24 ENCOUNTER — Ambulatory Visit: Payer: Medicare Other | Attending: Infectious Diseases | Admitting: Infectious Diseases

## 2019-03-24 ENCOUNTER — Other Ambulatory Visit: Payer: Self-pay

## 2019-03-24 ENCOUNTER — Other Ambulatory Visit: Payer: Self-pay | Admitting: Infectious Diseases

## 2019-03-24 DIAGNOSIS — A310 Pulmonary mycobacterial infection: Secondary | ICD-10-CM | POA: Diagnosis not present

## 2019-03-24 LAB — AFB ORGANISM ID BY DNA PROBE
M avium complex: POSITIVE — AB
M tuberculosis complex: NEGATIVE

## 2019-03-24 LAB — ACID FAST CULTURE WITH REFLEXED SENSITIVITIES (MYCOBACTERIA): Acid Fast Culture: POSITIVE

## 2019-03-24 MED ORDER — AZITHROMYCIN 500 MG PO TABS: 500.0000 mg | ORAL_TABLET | Freq: Every day | ORAL | 0 refills | Status: DC

## 2019-03-24 MED ORDER — ETHAMBUTOL HCL 400 MG PO TABS: 800.0000 mg | ORAL_TABLET | Freq: Every day | ORAL | 0 refills | Status: DC

## 2019-03-24 MED ORDER — RIFAMPIN 300 MG PO CAPS: 600.0000 mg | ORAL_CAPSULE | Freq: Every day | ORAL | 0 refills | Status: DC

## 2019-03-24 NOTE — Progress Notes (Signed)
The purpose of this virtual visit is to provide medical care while limiting exposure to the novel coronavirus (COVID19) for both patient and office staff.   Consent was obtained for phone visit:  Yes.   Answered questions that patient had about telehealth interaction:  Yes.   I discussed the limitations, risks, security and privacy concerns of performing an evaluation and management service by telephone. I also discussed with the patient that there may be a patient responsible charge related to this service. The patient expressed understanding and agreed to proceed.   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 He has been given information on all the above meds before Told to change simvastatin- can do rosuvastatin Also daliresp may not be effective with the rifampin He has an appt with PCP Joseph Peters on Feb 2nd and will discuss to change meds-  Follow up in 2 weeks with labs

## 2019-04-01 DIAGNOSIS — G2581 Restless legs syndrome: Secondary | ICD-10-CM | POA: Insufficient documentation

## 2019-04-02 ENCOUNTER — Telehealth: Payer: Self-pay | Admitting: Infectious Diseases

## 2019-04-02 MED ORDER — ONDANSETRON 4 MG PO TBDP: 4.0000 mg | ORAL_TABLET | Freq: Three times a day (TID) | ORAL | 0 refills | Status: DC | PRN

## 2019-04-02 NOTE — Telephone Encounter (Signed)
Call from Patient and Hinton Dyer his care taker- pt has had nausea, vomiting and diarrhea since starting treatment for MAC last week. Told them to stop meds now and asked to come to ED if he is unable to keep anything down- they dont want to come to the Ed and wait. So will give zofran to see whether he can take Fluids- if he is dehydrated or other symptoms like dizzy, low BP he will have to come to ED. Decision to restart triple therapy for MAC ( azithromycin, rifampin+ ethambutol) will be made later

## 2019-04-03 LAB — CULTURE, FUNGUS WITHOUT SMEAR

## 2019-04-06 ENCOUNTER — Other Ambulatory Visit: Payer: Self-pay | Admitting: Infectious Diseases

## 2019-04-06 DIAGNOSIS — A310 Pulmonary mycobacterial infection: Secondary | ICD-10-CM

## 2019-04-07 ENCOUNTER — Encounter: Payer: Self-pay | Admitting: Intensive Care

## 2019-04-07 ENCOUNTER — Ambulatory Visit: Payer: Medicare Other | Admitting: Infectious Diseases

## 2019-04-07 ENCOUNTER — Other Ambulatory Visit: Payer: Self-pay

## 2019-04-07 ENCOUNTER — Inpatient Hospital Stay
Admit: 2019-04-07 | Discharge: 2019-04-07 | Disposition: A | Discharge status: Home / Self Care | Payer: Medicare Other | Attending: Cardiology | Admitting: Cardiology

## 2019-04-07 ENCOUNTER — Inpatient Hospital Stay
Admission: EM | Admit: 2019-04-07 | Discharge: 2019-04-11 | DRG: 177 | Disposition: A | Discharge status: Home Health | Payer: Medicare Other | Source: Ambulatory Visit | Attending: Internal Medicine | Admitting: Internal Medicine

## 2019-04-07 ENCOUNTER — Emergency Department: Payer: Medicare Other

## 2019-04-07 DIAGNOSIS — J441 Chronic obstructive pulmonary disease with (acute) exacerbation: Secondary | ICD-10-CM | POA: Diagnosis present

## 2019-04-07 DIAGNOSIS — Z9889 Other specified postprocedural states: Secondary | ICD-10-CM

## 2019-04-07 DIAGNOSIS — Z880 Allergy status to penicillin: Secondary | ICD-10-CM

## 2019-04-07 DIAGNOSIS — J90 Pleural effusion, not elsewhere classified: Secondary | ICD-10-CM

## 2019-04-07 DIAGNOSIS — Z79899 Other long term (current) drug therapy: Secondary | ICD-10-CM | POA: Diagnosis not present

## 2019-04-07 DIAGNOSIS — E785 Hyperlipidemia, unspecified: Secondary | ICD-10-CM | POA: Diagnosis present

## 2019-04-07 DIAGNOSIS — J432 Centrilobular emphysema: Secondary | ICD-10-CM | POA: Diagnosis present

## 2019-04-07 DIAGNOSIS — I214 Non-ST elevation (NSTEMI) myocardial infarction: Secondary | ICD-10-CM | POA: Diagnosis not present

## 2019-04-07 DIAGNOSIS — Z96611 Presence of right artificial shoulder joint: Secondary | ICD-10-CM | POA: Diagnosis present

## 2019-04-07 DIAGNOSIS — Z96612 Presence of left artificial shoulder joint: Secondary | ICD-10-CM | POA: Diagnosis present

## 2019-04-07 DIAGNOSIS — Z8673 Personal history of transient ischemic attack (TIA), and cerebral infarction without residual deficits: Secondary | ICD-10-CM

## 2019-04-07 DIAGNOSIS — F329 Major depressive disorder, single episode, unspecified: Secondary | ICD-10-CM | POA: Diagnosis present

## 2019-04-07 DIAGNOSIS — R0602 Shortness of breath: Secondary | ICD-10-CM

## 2019-04-07 DIAGNOSIS — N179 Acute kidney failure, unspecified: Secondary | ICD-10-CM | POA: Diagnosis present

## 2019-04-07 DIAGNOSIS — D649 Anemia, unspecified: Secondary | ICD-10-CM | POA: Diagnosis present

## 2019-04-07 DIAGNOSIS — Z96641 Presence of right artificial hip joint: Secondary | ICD-10-CM | POA: Diagnosis present

## 2019-04-07 DIAGNOSIS — J449 Chronic obstructive pulmonary disease, unspecified: Secondary | ICD-10-CM | POA: Diagnosis not present

## 2019-04-07 DIAGNOSIS — J9601 Acute respiratory failure with hypoxia: Secondary | ICD-10-CM | POA: Diagnosis present

## 2019-04-07 DIAGNOSIS — F17210 Nicotine dependence, cigarettes, uncomplicated: Secondary | ICD-10-CM | POA: Diagnosis present

## 2019-04-07 DIAGNOSIS — I252 Old myocardial infarction: Secondary | ICD-10-CM | POA: Diagnosis not present

## 2019-04-07 DIAGNOSIS — J8410 Pulmonary fibrosis, unspecified: Secondary | ICD-10-CM | POA: Diagnosis present

## 2019-04-07 DIAGNOSIS — I714 Abdominal aortic aneurysm, without rupture, unspecified: Secondary | ICD-10-CM | POA: Diagnosis present

## 2019-04-07 DIAGNOSIS — A310 Pulmonary mycobacterial infection: Secondary | ICD-10-CM | POA: Diagnosis present

## 2019-04-07 DIAGNOSIS — I2510 Atherosclerotic heart disease of native coronary artery without angina pectoris: Secondary | ICD-10-CM | POA: Diagnosis present

## 2019-04-07 DIAGNOSIS — Z825 Family history of asthma and other chronic lower respiratory diseases: Secondary | ICD-10-CM

## 2019-04-07 DIAGNOSIS — Z884 Allergy status to anesthetic agent status: Secondary | ICD-10-CM | POA: Diagnosis not present

## 2019-04-07 DIAGNOSIS — I10 Essential (primary) hypertension: Secondary | ICD-10-CM | POA: Diagnosis present

## 2019-04-07 DIAGNOSIS — K219 Gastro-esophageal reflux disease without esophagitis: Secondary | ICD-10-CM | POA: Diagnosis present

## 2019-04-07 DIAGNOSIS — I712 Thoracic aortic aneurysm, without rupture: Secondary | ICD-10-CM | POA: Diagnosis present

## 2019-04-07 DIAGNOSIS — Z20822 Contact with and (suspected) exposure to covid-19: Secondary | ICD-10-CM | POA: Diagnosis present

## 2019-04-07 DIAGNOSIS — R778 Other specified abnormalities of plasma proteins: Secondary | ICD-10-CM | POA: Diagnosis not present

## 2019-04-07 DIAGNOSIS — Z7951 Long term (current) use of inhaled steroids: Secondary | ICD-10-CM

## 2019-04-07 DIAGNOSIS — R079 Chest pain, unspecified: Secondary | ICD-10-CM

## 2019-04-07 DIAGNOSIS — Z8619 Personal history of other infectious and parasitic diseases: Secondary | ICD-10-CM

## 2019-04-07 DIAGNOSIS — I700 Atherosclerosis of aorta: Secondary | ICD-10-CM | POA: Diagnosis present

## 2019-04-07 DIAGNOSIS — R55 Syncope and collapse: Secondary | ICD-10-CM | POA: Diagnosis not present

## 2019-04-07 DIAGNOSIS — Z720 Tobacco use: Secondary | ICD-10-CM | POA: Diagnosis not present

## 2019-04-07 DIAGNOSIS — R0902 Hypoxemia: Secondary | ICD-10-CM | POA: Diagnosis not present

## 2019-04-07 LAB — CBC
HCT: 39.3 % (ref 39.0–52.0)
Hemoglobin: 13.2 g/dL (ref 13.0–17.0)
MCH: 29.6 pg (ref 26.0–34.0)
MCHC: 33.6 g/dL (ref 30.0–36.0)
MCV: 88.1 fL (ref 80.0–100.0)
Platelets: 238 10*3/uL (ref 150–400)
RBC: 4.46 MIL/uL (ref 4.22–5.81)
RDW: 13.9 % (ref 11.5–15.5)
WBC: 9.6 10*3/uL (ref 4.0–10.5)
nRBC: 0 % (ref 0.0–0.2)

## 2019-04-07 LAB — BASIC METABOLIC PANEL
Anion gap: 13 (ref 5–15)
BUN: 15 mg/dL (ref 8–23)
CO2: 24 mmol/L (ref 22–32)
Calcium: 8.7 mg/dL — ABNORMAL LOW (ref 8.9–10.3)
Chloride: 97 mmol/L — ABNORMAL LOW (ref 98–111)
Creatinine, Ser: 1.18 mg/dL (ref 0.61–1.24)
GFR calc Af Amer: >60 mL/min (ref >60)
GFR calc non Af Amer: >60 mL/min (ref >60)
Glucose, Bld: 104 mg/dL — ABNORMAL HIGH (ref 70–99)
Potassium: 4 mmol/L (ref 3.5–5.1)
Sodium: 134 mmol/L — ABNORMAL LOW (ref 135–145)

## 2019-04-07 LAB — PROTIME-INR
INR: 1.1 (ref 0.8–1.2)
Prothrombin Time: 13.9 seconds (ref 11.4–15.2)

## 2019-04-07 LAB — APTT: aPTT: 32 seconds (ref 24–36)

## 2019-04-07 LAB — RESPIRATORY PANEL BY RT PCR (FLU A&B, COVID)
Influenza A by PCR: NEGATIVE
Influenza B by PCR: NEGATIVE
SARS Coronavirus 2 by RT PCR: NEGATIVE

## 2019-04-07 LAB — TROPONIN I (HIGH SENSITIVITY)
Troponin I (High Sensitivity): 169 ng/L (ref <18)
Troponin I (High Sensitivity): 129 ng/L (ref <18)
Troponin I (High Sensitivity): 119 ng/L (ref <18)
Troponin I (High Sensitivity): 87 ng/L — ABNORMAL HIGH (ref <18)

## 2019-04-07 LAB — TSH: TSH: 2.292 u[IU]/mL (ref 0.350–4.500)

## 2019-04-07 LAB — BRAIN NATRIURETIC PEPTIDE: B Natriuretic Peptide: 3036 pg/mL — ABNORMAL HIGH (ref 0.0–100.0)

## 2019-04-07 LAB — HEPARIN LEVEL (UNFRACTIONATED): Heparin Unfractionated: 0.1 IU/mL — ABNORMAL LOW (ref 0.30–0.70)

## 2019-04-07 LAB — T4, FREE: Free T4: 1.17 ng/dL — ABNORMAL HIGH (ref 0.61–1.12)

## 2019-04-07 MED ORDER — ACETAMINOPHEN 325 MG PO TABS: 650.0000 mg | ORAL_TABLET | ORAL | Status: DC | PRN

## 2019-04-07 MED ORDER — FINASTERIDE 5 MG PO TABS
5.0000 mg | ORAL_TABLET | Freq: Every day | ORAL | Status: DC
  Administered 2019-04-08 – 2019-04-11 (×4): 5 mg via ORAL
  Filled 2019-04-07 (×6): qty 1

## 2019-04-07 MED ORDER — PREDNISONE 20 MG PO TABS
60.0000 mg | ORAL_TABLET | Freq: Once | ORAL | Status: AC
  Administered 2019-04-07: 60 mg via ORAL
  Filled 2019-04-07: qty 3

## 2019-04-07 MED ORDER — SODIUM CHLORIDE 0.9 % IV SOLN
250.0000 mL | INTRAVENOUS | Status: DC | PRN
  Administered 2019-04-08: 250 mL via INTRAVENOUS

## 2019-04-07 MED ORDER — LACTATED RINGERS IV SOLN: INTRAVENOUS | Status: DC

## 2019-04-07 MED ORDER — OXYCODONE HCL 5 MG PO TABS
10.0000 mg | ORAL_TABLET | Freq: Four times a day (QID) | ORAL | Status: DC | PRN
  Administered 2019-04-07: 10 mg via ORAL
  Filled 2019-04-07 (×2): qty 2

## 2019-04-07 MED ORDER — NITROGLYCERIN 0.4 MG SL SUBL: 0.4000 mg | SUBLINGUAL_TABLET | SUBLINGUAL | Status: DC | PRN

## 2019-04-07 MED ORDER — LOSARTAN POTASSIUM 25 MG PO TABS
25.0000 mg | ORAL_TABLET | Freq: Every day | ORAL | Status: DC
  Administered 2019-04-08 – 2019-04-11 (×4): 25 mg via ORAL
  Filled 2019-04-07 (×5): qty 1

## 2019-04-07 MED ORDER — AZITHROMYCIN 250 MG PO TABS
500.0000 mg | ORAL_TABLET | Freq: Every day | ORAL | Status: DC
  Administered 2019-04-09 – 2019-04-11 (×3): 500 mg via ORAL
  Filled 2019-04-07 (×5): qty 2

## 2019-04-07 MED ORDER — LACTATED RINGERS IV BOLUS
1000.0000 mL | Freq: Once | INTRAVENOUS | Status: AC
  Administered 2019-04-07: 1000 mL via INTRAVENOUS

## 2019-04-07 MED ORDER — BENZONATATE 100 MG PO CAPS
200.0000 mg | ORAL_CAPSULE | Freq: Three times a day (TID) | ORAL | Status: DC | PRN
  Administered 2019-04-07 – 2019-04-10 (×5): 200 mg via ORAL
  Filled 2019-04-07 (×5): qty 2

## 2019-04-07 MED ORDER — SODIUM CHLORIDE 0.9% FLUSH
3.0000 mL | Freq: Two times a day (BID) | INTRAVENOUS | Status: DC
  Administered 2019-04-07 – 2019-04-10 (×7): 3 mL via INTRAVENOUS

## 2019-04-07 MED ORDER — HEPARIN (PORCINE) 25000 UT/250ML-% IV SOLN
900.0000 [IU]/h | INTRAVENOUS | Status: DC
  Administered 2019-04-07: 15:00:00 550 [IU]/h via INTRAVENOUS
  Filled 2019-04-07 (×2): qty 250

## 2019-04-07 MED ORDER — GUAIFENESIN-DM 100-10 MG/5ML PO SYRP
10.0000 mL | ORAL_SOLUTION | ORAL | Status: DC | PRN
  Administered 2019-04-07: 10 mL via ORAL
  Filled 2019-04-07: qty 10

## 2019-04-07 MED ORDER — HEPARIN BOLUS VIA INFUSION
3000.0000 [IU] | Freq: Once | INTRAVENOUS | Status: AC
  Administered 2019-04-07: 3000 [IU] via INTRAVENOUS
  Filled 2019-04-07: qty 3000

## 2019-04-07 MED ORDER — FLUOXETINE HCL 20 MG PO CAPS
20.0000 mg | ORAL_CAPSULE | Freq: Every day | ORAL | Status: DC
  Administered 2019-04-08 – 2019-04-11 (×4): 20 mg via ORAL
  Filled 2019-04-07 (×4): qty 1

## 2019-04-07 MED ORDER — ASPIRIN 81 MG PO CHEW
324.0000 mg | CHEWABLE_TABLET | ORAL | Status: AC
  Administered 2019-04-07: 324 mg via ORAL
  Filled 2019-04-07: qty 4

## 2019-04-07 MED ORDER — ONDANSETRON HCL 4 MG/2ML IJ SOLN: 4.0000 mg | Freq: Four times a day (QID) | INTRAMUSCULAR | Status: DC | PRN

## 2019-04-07 MED ORDER — ALBUTEROL SULFATE HFA 108 (90 BASE) MCG/ACT IN AERS
2.0000 | INHALATION_SPRAY | Freq: Once | RESPIRATORY_TRACT | Status: DC
  Filled 2019-04-07: qty 6.7

## 2019-04-07 MED ORDER — IOHEXOL 350 MG/ML SOLN
60.0000 mL | Freq: Once | INTRAVENOUS | Status: AC | PRN
  Administered 2019-04-07: 13:00:00 60 mL via INTRAVENOUS

## 2019-04-07 MED ORDER — ETHAMBUTOL HCL 400 MG PO TABS
800.0000 mg | ORAL_TABLET | Freq: Every day | ORAL | Status: DC
  Administered 2019-04-09 – 2019-04-11 (×3): 800 mg via ORAL
  Filled 2019-04-07 (×6): qty 2

## 2019-04-07 MED ORDER — ONDANSETRON HCL 4 MG/2ML IJ SOLN
4.0000 mg | Freq: Once | INTRAMUSCULAR | Status: AC
  Administered 2019-04-07: 12:00:00 4 mg via INTRAVENOUS
  Filled 2019-04-07: qty 2

## 2019-04-07 MED ORDER — ASPIRIN EC 81 MG PO TBEC
81.0000 mg | DELAYED_RELEASE_TABLET | Freq: Every day | ORAL | Status: DC
  Administered 2019-04-08 – 2019-04-11 (×4): 81 mg via ORAL
  Filled 2019-04-07 (×5): qty 1

## 2019-04-07 MED ORDER — RIFAMPIN 300 MG PO CAPS
600.0000 mg | ORAL_CAPSULE | Freq: Every day | ORAL | Status: DC
  Administered 2019-04-08: 600 mg via ORAL
  Filled 2019-04-07 (×2): qty 2

## 2019-04-07 MED ORDER — HEPARIN BOLUS VIA INFUSION
2000.0000 [IU] | Freq: Once | INTRAVENOUS | Status: DC
  Filled 2019-04-07: qty 2000

## 2019-04-07 MED ORDER — SODIUM CHLORIDE 0.9% FLUSH: 3.0000 mL | INTRAVENOUS | Status: DC | PRN

## 2019-04-07 MED ORDER — ATORVASTATIN CALCIUM 80 MG PO TABS
80.0000 mg | ORAL_TABLET | Freq: Every day | ORAL | Status: DC
  Administered 2019-04-07 – 2019-04-08 (×2): 80 mg via ORAL
  Filled 2019-04-07 (×2): qty 1

## 2019-04-07 MED ORDER — TAMSULOSIN HCL 0.4 MG PO CAPS
0.4000 mg | ORAL_CAPSULE | Freq: Every day | ORAL | Status: DC
  Administered 2019-04-07 – 2019-04-10 (×4): 0.4 mg via ORAL
  Filled 2019-04-07 (×4): qty 1

## 2019-04-07 MED ORDER — ASPIRIN 300 MG RE SUPP: 300.0000 mg | RECTAL | Status: AC

## 2019-04-07 NOTE — H&P (Signed)
History and Physical    Joseph Peters LYY:[id number] DOB: 12-Feb-1956 DOA: 04/07/2019   PCP: Barbaraann Boys, MD   Outpatient Specialists: Dr.ravishankar   Patient coming from: home  Chief Complaint: Chest pain  HPI: Joseph Peters is a 64 y.o. male with medical history significant of mi and MAC c/o chest pain and today he could not walk and became sob and  He fell , got up from kitchen floor and then tried to go to his ID appt and was sent here  Form there for chest pain and sob.   ED Course:  Blood pressure (!) 131/98, pulse (!) 102, temperature 98.4 F (36.9 C), temperature source Oral, resp. rate 13, height _0  (1.651 m), weight 45.8 kg, SpO2 93 %.  Pt is alert and gives history. Pt reports 5/10 chest pain and is middle of chest and it radiated to right side and then to left side of chest. Pt report nausea and vomiting ad dizziness. Pt was given albuterol breathing t/t and prednisone and zofran and 2 L of lr bolus, asa and ntg.   Review of Systems: As per HPI otherwise 10 point review of systems otherwise negative.   Past Medical History:  Diagnosis Date  . AAA (abdominal aortic aneurysm) without rupture ([place])   . Abdominal aneurysm ([place]) 2015   being followed but not large enough to surgically treat  . Alcoholism ([place])   . Anxiety   . Arthritis   . Asthma   . Benign hypertension 07/16/2014  . Chest pain on exertion 07/16/2014  . Chronic pain syndrome   . Coccydynia 02/08/2012  . COPD (chronic obstructive pulmonary disease) ([place])    patient smokes  . Coronary artery disease   . 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 Depression   . 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 Dyspnea   . Frequent falls   . GERD (gastroesophageal reflux disease)   . Hyperlipidemia   . IBS (irritable bowel syndrome)   . Myocardial infarction ([place]) 2010  . Neuropathy   . Pneumonia   . Prostatitis   . Restless leg syndrome   . Sciatica   . Spinal  stenosis of lumbar region   . Stroke ([place]) 2015   no residual symptoms  . Tremor, essential     Past Surgical History:  Procedure Laterality Date  . CARDIAC CATHETERIZATION  2012  . COLONOSCOPY WITH PROPOFOL N/A 11/14/2016   Procedure: COLONOSCOPY WITH PROPOFOL;  Surgeon: Manya Silvas, MD;  Location[facility] ENDOSCOPY;  Service: Endoscopy;  Laterality: N/A;  . ESOPHAGOGASTRODUODENOSCOPY (EGD) WITH PROPOFOL N/A 11/14/2016   Procedure: ESOPHAGOGASTRODUODENOSCOPY (EGD) WITH PROPOFOL;  Surgeon: Manya Silvas, MD;  Location: [facility] ENDOSCOPY;  Service: Endoscopy;  Laterality: N/A;  . JOINT REPLACEMENT Right    hip  . KNEE SURGERY     right  . SHOULDER ARTHROSCOPY WITH OPEN ROTATOR CUFF REPAIR Right 08/20/2017   Procedure: SHOULDER ARTHROSCOPY WITH OPEN ROTATOR CUFF REPAIR;  Surgeon: Corky Mull, MD;  Location: [facility] ORS;  Service: Orthopedics;  Laterality: Right;  . SHOULDER ARTHROSCOPY WITH SUBACROMIAL DECOMPRESSION, ROTATOR CUFF REPAIR AND BICEP TENDON REPAIR Left 05/10/2016   Procedure: SHOULDER ARTHROSCOPY WITH DEBTRIDEMENT, DECOMPRESSION, REPAIR OF MASSIVE ROTATOR CUFF TEAR AND BICEP TENODESIS;  Surgeon: Corky Mull, MD;  Location: [facility] ORS;  Service: Orthopedics;  Laterality: Left;  Massive cuff tear  . TOTAL HIP ARTHROPLASTY Right 07/01/2018   Procedure: TOTAL HIP ARTHROPLASTY - RIGHT;  Surgeon:  Poggi, Marshall Cork, MD;  Location: [facility] ORS;  Service: Orthopedics;  Laterality: Right;  Marland Kitchen VIDEO BRONCHOSCOPY WITH ENDOBRONCHIAL NAVIGATION N/A 02/13/2019   Procedure: VIDEO BRONCHOSCOPY WITH ENDOBRONCHIAL NAVIGATION;  Surgeon: Ottie Glazier, MD;  Location: [facility] ORS;  Service: Thoracic;  Laterality: N/A;  . VIDEO BRONCHOSCOPY WITH ENDOBRONCHIAL NAVIGATION N/A 03/13/2019   Procedure: VIDEO BRONCHOSCOPY WITH ENDOBRONCHIAL NAVIGATION;  Surgeon: Ottie Glazier, MD;  Location: [facility] ORS;  Service: Thoracic;  Laterality: N/A;  . VIDEO BRONCHOSCOPY WITH ENDOBRONCHIAL ULTRASOUND N/A 02/13/2019   Procedure:  VIDEO BRONCHOSCOPY WITH ENDOBRONCHIAL ULTRASOUND;  Surgeon: Ottie Glazier, MD;  Location: [facility] ORS;  Service: Thoracic;  Laterality: N/A;  . VIDEO BRONCHOSCOPY WITH ENDOBRONCHIAL ULTRASOUND N/A 03/13/2019   Procedure: VIDEO BRONCHOSCOPY WITH ENDOBRONCHIAL ULTRASOUND;  Surgeon: Ottie Glazier, MD;  Location: [facility] ORS;  Service: Thoracic;  Laterality: N/A;     reports that he has been smoking cigarettes. He started smoking about 46 years ago. He has a 15.00 pack-year smoking history. He quit smokeless tobacco use about 21 years ago. He reports current alcohol use of about 20.0 standard drinks of alcohol per week. He reports current drug use.  Allergies  Allergen Reactions  . Penicillins Swelling, Rash and Other (See Comments)    Swelling in throat  Did it involve swelling of the face/tongue/throat, SOB, or low BP? yes Did it involve sudden or severe rash/hives, skin peeling, or any reaction on the inside of your mouth or nose? no Did you need to seek medical attention at a hospital or doctor's office? Yes When did it last happen?years  If all above answers are "NO", may proceed with cephalosporin use.    . Lidocaine Other (See Comments)    Unsure  At dentist's office    Family History  Problem Relation Age of Onset  . Cancer Mother   . COPD Father 71       black lung     Prior to Admission medications   Medication Sig Start Date End Date Taking? Authorizing Provider  albuterol (PROVENTIL HFA;VENTOLIN HFA) 108 (90 Base) MCG/ACT inhaler Inhale 2 puffs into the lungs every 4 (four) hours as needed for wheezing or shortness of breath.     [provider]  azithromycin (ZITHROMAX) 500 MG tablet Take 1 tablet (500 mg total) by mouth daily. 03/24/19   Tsosie Billing, MD  DALIRESP 250 MCG TABS Take 250 mcg by mouth at bedtime. 10/17/18   Stark Jock Jude, MD  ethambutol (MYAMBUTOL) 400 MG tablet Take 2 tablets (800 mg total) by mouth daily. 03/24/19   Tsosie Billing,  MD  finasteride (PROSCAR) 5 MG tablet Take 1 tablet (5 mg total) by mouth daily. 03/02/19   Festus Aloe, MD  FLUoxetine (PROZAC) 20 MG capsule Take 20 mg by mouth daily.     [provider]  Fluticasone-Umeclidin-Vilant (TRELEGY ELLIPTA) 100-62.5-25 MCG/INH AEPB Inhale 2 puffs into the lungs at bedtime.  08/18/18   [provider]  gabapentin (NEURONTIN) 300 MG capsule Take 900 mg by mouth at bedtime.     [provider]  losartan (COZAAR) 25 MG tablet Take 25 mg by mouth daily.     [provider]  montelukast (SINGULAIR) 10 MG tablet Take 10 mg by mouth at bedtime.    [provider]  Nebulizers (COMP AIR COMPRESSOR NEBULIZER) MISC USE AS DIRECTED 07/19/17   [provider]  omeprazole (PRILOSEC) 20 MG capsule Take 20 mg by mouth daily.    [provider]  ondansetron (ZOFRAN ODT) 4  MG disintegrating tablet Take 1 tablet (4 mg total) by mouth every 8 (eight) hours as needed for nausea or vomiting. 04/02/19   Tsosie Billing, MD  Oxycodone HCl 10 MG TABS Take 10 mg by mouth 4 (four) times daily as needed for pain. 09/29/18   [provider]  rifampin (RIFADIN) 300 MG capsule Take 2 capsules (600 mg total) by mouth daily. 03/24/19   Tsosie Billing, MD  simvastatin (ZOCOR) 40 MG tablet Take 40 mg by mouth at bedtime.     [provider]  tamsulosin (FLOMAX) 0.4 MG CAPS capsule Take 1 capsule (0.4 mg total) by mouth daily after supper. 12/22/18   Festus Aloe, MD    Physical Exam: Vitals:   04/07/19 1130 04/07/19 1200 04/07/19 1230 04/07/19 1537  BP: (!) 139/98 (!) 135/91 (!) 131/98 (!) 121/92  Pulse: (!) 112 (!) 108 (!) 102 98  Resp: 13   18  Temp:    98.1 F (36.7 C)  TempSrc:    Oral  SpO2: 93% 90% 93% 93%  Weight:    54.7 kg  Height:    _0  (1.651 m)      Constitutional: NAD, calm, comfortable Vitals:   04/07/19 1130 04/07/19 1200 04/07/19 1230 04/07/19 1537  BP: (!) 139/98 (!)  135/91 (!) 131/98 (!) 121/92  Pulse: (!) 112 (!) 108 (!) 102 98  Resp: 13   18  Temp:    98.1 F (36.7 C)  TempSrc:    Oral  SpO2: 93% 90% 93% 93%  Weight:    54.7 kg  Height:    _1  (1.651 m)   Eyes: PERRL, lids and conjunctivae normal ENMT: Mucous membranes are moist. Posterior pharynx clear of any exudate or lesions.Normal dentition.  Neck: normal, supple, no masses, no thyromegaly Respiratory: clear to auscultation bilaterally, no wheezing, no crackles. Normal respiratory effort. No accessory muscle use.  Cardiovascular: Regular rate and rhythm, no murmurs / rubs / gallops. No extremity edema. 2+ pedal pulses. No carotid bruits.  Abdomen: no tenderness, no masses palpated. No hepatosplenomegaly. Bowel sounds positive.  Musculoskeletal: no clubbing / cyanosis. No joint deformity upper and lower extremities. Good ROM, no contractures. Normal muscle tone.  Skin: no rashes, lesions, ulcers. No induration Neurologic: CN 2-12 grossly intact. Sensation intact, DTR normal. Strength 5/5 in all 4.  Psychiatric: Normal judgment and insight. Alert and oriented x 3. Normal mood.    Labs on Admission: I have personally reviewed following labs and imaging studies  CBC: Recent Labs  Lab 04/07/19 1131  WBC 9.6  HGB 13.2  HCT 39.3  MCV 88.1  PLT 700   Basic Metabolic Panel: Recent Labs  Lab 04/07/19 1131  NA 134*  K 4.0  CL 97*  CO2 24  GLUCOSE 104*  BUN 15  CREATININE 1.18  CALCIUM 8.7*   GFR: Estimated Creatinine Clearance: 50.2 mL/min (by C-G formula based on SCr of 1.18 mg/dL). Liver Function Tests: No results for input(s): AST, ALT, ALKPHOS, BILITOT, PROT, ALBUMIN in the last 168 hours. No results for input(s): LIPASE, AMYLASE in the last 168 hours. No results for input(s): AMMONIA in the last 168 hours. Coagulation Profile: Recent Labs  Lab 04/07/19 1131  INR 1.1   Cardiac Enzymes: No results for input(s): CKTOTAL, CKMB, CKMBINDEX, TROPONINI in the last 168  hours. BNP (last 3 results) No results for input(s): PROBNP in the last 8760 hours. HbA1C: No results for input(s): HGBA1C in the last 72 hours. CBG: No results for input(s):  GLUCAP in the last 168 hours. Lipid Profile: No results for input(s): CHOL, HDL, LDLCALC, TRIG, CHOLHDL, LDLDIRECT in the last 72 hours. Thyroid Function Tests: Recent Labs    04/07/19 1326  TSH 2.292  FREET4 1.17*   Anemia Panel: No results for input(s): VITAMINB12, FOLATE, FERRITIN, TIBC, IRON, RETICCTPCT in the last 72 hours. Urine analysis:    Component Value Date/Time   COLORURINE STRAW (A) 10/14/2018 1112   APPEARANCEUR CLEAR (A) 10/14/2018 1112   APPEARANCEUR Clear 11/02/2014 1602   LABSPEC 1.036 (H) 10/14/2018 1112   PHURINE 6.0 10/14/2018 1112   GLUCOSEU NEGATIVE 10/14/2018 1112   HGBUR NEGATIVE 10/14/2018 1112   BILIRUBINUR NEGATIVE 10/14/2018 1112   BILIRUBINUR Negative 11/02/2014 1602   KETONESUR 5 (A) 10/14/2018 1112   PROTEINUR NEGATIVE 10/14/2018 1112   NITRITE NEGATIVE 10/14/2018 1112   LEUKOCYTESUR NEGATIVE 10/14/2018 1112    Recent Results (from the past 240 hour(s))  Respiratory Panel by RT PCR (Flu A&B, Covid) - Nasopharyngeal Swab     Status: None   Collection Time: 04/07/19 11:36 AM   Specimen: Nasopharyngeal Swab  Result Value Ref Range Status   SARS Coronavirus 2 by RT PCR NEGATIVE NEGATIVE Final    Comment: (NOTE) SARS-CoV-2 target nucleic acids are NOT DETECTED. The SARS-CoV-2 RNA is generally detectable in upper respiratoy specimens during the acute phase of infection. The lowest concentration of SARS-CoV-2 viral copies this assay can detect is 131 copies/mL. A negative result does not preclude SARS-Cov-2 infection and should not be used as the sole basis for treatment or other patient management decisions. A negative result may occur with  improper specimen collection/handling, submission of specimen other than nasopharyngeal swab, presence of viral mutation(s)  within the areas targeted by this assay, and inadequate number of viral copies (<131 copies/mL). A negative result must be combined with clinical observations, patient history, and epidemiological information. The expected result is Negative. Fact Sheet for Patients:  PinkCheek.be Fact Sheet for Healthcare Providers:  GravelBags.it This test is not yet ap proved or cleared by the [place] FDA and  has been authorized for detection and/or diagnosis of SARS-CoV-2 by FDA under an Emergency Use Authorization (EUA). This EUA will remain  in effect (meaning this test can be used) for the duration of the COVID-19 declaration under Section 564(b)(1) of the Act, 21 U.S.C. section 360bbb-3(b)(1), unless the authorization is terminated or revoked sooner.    Influenza A by PCR NEGATIVE NEGATIVE Final   Influenza B by PCR NEGATIVE NEGATIVE Final    Comment: (NOTE) The Xpert Xpress SARS-CoV-2/FLU/RSV assay is intended as an aid in  the diagnosis of influenza from Nasopharyngeal swab specimens and  should not be used as a sole basis for treatment. Nasal washings and  aspirates are unacceptable for Xpert Xpress SARS-CoV-2/FLU/RSV  testing. Fact Sheet for Patients: PinkCheek.be Fact Sheet for Healthcare Providers: GravelBags.it This test is not yet approved or cleared by the [place] FDA and  has been authorized for detection and/or diagnosis of SARS-CoV-2 by  FDA under an Emergency Use Authorization (EUA). This EUA will remain  in effect (meaning this test can be used) for the duration of the  Covid-19 declaration under Section 564(b)(1) of the Act, 21  U.S.C. section 360bbb-3(b)(1), unless the authorization is  terminated or revoked. Performed at [facility], [address] [place], [place] [address]      Radiological Exams on Admission: DG Chest 2  View  Result Date: 04/07/2019 CLINICAL DATA:  Chest pain and  shortness of breath EXAM: CHEST - 2 VIEW COMPARISON:  March 13, 2019 chest radiograph and chest CT February 13, 2019 FINDINGS: There remains consolidation with volume loss in the right upper lobe with slightly improved aeration in this area compared to prior studies. Elsewhere, there is extensive underlying emphysematous change with fibrosis. Fibrosis is most severe in the right base region. No new opacity evident. Heart size and pulmonary vascularity are normal. No adenopathy is evident. There is aortic atherosclerosis. No bone lesions. There is calcification in each carotid artery. IMPRESSION: Underlying emphysematous change in fibrosis. Consolidation in the right upper lobe is slightly less pronounced than on recent prior studies. No new opacity evident. Stable cardiac silhouette. No adenopathy demonstrable by radiography. Aortic Atherosclerosis (ICD10-I70.0). Electronically Signed   By: Lowella Grip III M.D.   On: 04/07/2019 11:20   CT Angio Chest PE W/Cm &/Or Wo Cm  Result Date: 04/07/2019 CLINICAL DATA:  Shortness of breath. EXAM: CT ANGIOGRAPHY CHEST WITH CONTRAST TECHNIQUE: Multidetector CT imaging of the chest was performed using the standard protocol during bolus administration of intravenous contrast. Multiplanar CT image reconstructions and MIPs were obtained to evaluate the vascular anatomy. CONTRAST:  32m OMNIPAQUE IOHEXOL 350 MG/ML SOLN COMPARISON:  Chest radiograph April 07, 2019; chest CT February 13, 2019 FINDINGS: Cardiovascular: There is no demonstrable pulmonary embolus. Ascending thoracic aortic diameter measures 4.6 x 4.5 cm. No dissection is appreciable. The contrast bolus in the aorta is somewhat less than optimal for dissection assessment. There is localized aneurysmal dilatation in the distal descending thoracic aorta at the diaphragmatic hiatus with localized plaque and calcification along the leftward aspect of  this aneurysm. The descending thoracic aorta at the level of the diaphragmatic hiatus measures 3.8 x 3.1 cm. Appearance of this aneurysm is similar to most recent CT. There is aortic atherosclerosis as well as scattered foci of calcification in proximal visualized great vessels. There are foci of coronary artery calcification. There is left ventricular hypertrophy. No pericardial effusion or pericardial thickening. Mediastinum/Nodes: Thyroid appears unremarkable. There are mildly prominent right hilar lymph nodes, largest measuring 1.5 x 1.3 cm. Subcarinal lymph node measures 1.4 x 1.2 cm. No esophageal lesions are evident. Lungs/Pleura: There is a moderate right pleural effusion with a much smaller left pleural effusion. There is extensive underlying emphysematous change with fibrosis in the lower lung regions, significantly more on the right than on the left. There are multiple areas of cicatrization throughout the lungs bilaterally, more severe on the right than on the left. There is consolidation in the anterior segment right upper lobe with air bronchograms and volume loss. No similar consolidation elsewhere. Upper Abdomen: There is upper abdominal aortic atherosclerosis. Visualized upper abdominal structures otherwise appear unremarkable. Musculoskeletal: Anterior wedging of a midthoracic vertebral body is stable. No blastic or lytic bone lesions. No evident chest wall lesions. Review of the MIP images confirms the above findings. IMPRESSION: 1.  No demonstrable pulmonary embolus. 2. Prominence of the ascending thoracic aorta with a measured diameter of 4.6 x 4.5 cm. Ascending thoracic aortic aneurysm. Recommend semi-annual imaging followup by CTA or MRA and referral to cardiothoracic surgery if not already obtained. This recommendation follows 2010 ACCF/AHA/AATS/ACR/ASA/SCA/SCAI/SIR/STS/SVM Guidelines for the Diagnosis and Management of Patients With Thoracic Aortic Disease. Circulation. 2010; 121:: [id number]  Aortic aneurysm NOS (ICD10-I71.9). No dissection evident with contrast bolus somewhat less than optimal for dissection assessment. 3. Localized aneurysmal dilatation of the descending thoracic aorta at the diaphragmatic hiatus level measuring 3.8 x 3.1 cm. Localized plaque and  dilatation along the leftward aspect of the aneurysm, also present previously. Note that there is aortic atherosclerosis as well as foci of great vessel and coronary artery calcification. 4. 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 Pleural effusions bilaterally, larger on the right than on the left. Persistent consolidation in the right upper lobe anteriorly with air bronchograms. Appearance most consistent with pneumonia with possibility of superimposed neoplasm not excluded anteriorly toward the apex on the right. 5.  Prominent right hilar and subcarinal lymph nodes. Aortic Atherosclerosis (ICD10-I70.0) and Emphysema (ICD10-J43.9). Aortic aneurysm NOS (ICD10-I71.9). Electronically Signed   By: Lowella Grip III M.D.   On: 04/07/2019 13:17    EKG: LVH SINUS TACH 117.  Assessment/Plan Principal Problem:   NSTEMI (non-ST elevated myocardial infarction) ([place]) Active Problems:   Benign hypertension   AAA (abdominal aortic aneurysm) ([facility])   COPD exacerbation ([facility])   Essential hypertension   Gastroesophageal reflux disease without esophagitis   NSTEMI: Pt had come in with acute onset of doe and chest pain.  He did report that he has been having some discomfort past few days.  We have started him on heparin drip and cardiology Dr. Saralyn Pilar has been consulted.  Pt is continued on statin therapy in addition to prn nitro and asa 81.  HTN: Blood pressure (!) 121/92, pulse 98, temperature 98.1 F (36.7 C), temperature source Oral, resp. rate 18, height _0  (1.651 m), weight 54.7 kg, SpO2 93 %. bp is stable we will follow, pt is on losartan 25 mg.   AAA: CTA chest done is   Negative for PE but shows and ascending thoracic aortic aneurysm 4.5 x 4.6 cm and also aneurysmal dilatation of descending thoracic aorta without evidence of dissection.  Vascular consult.   DVT prophylaxis: Heparin drip full anticoagulation dose.   Code Status:Full Family Communication: none Disposition Plan: home Consults called: Dr.End called by edmd in ED. Admission status: Inpateint   Para Skeans MD [facility] If 7PM-7AM, please contact night-coverage [email or web address] Password [facility]  04/07/2019, 5:28 PM

## 2019-04-07 NOTE — Consult Note (Signed)
ANTICOAGULATION CONSULT NOTE - Initial Consult  Pharmacy Consult for Heparin Drip Indication: chest pain/ACS/STEMI  Allergies  Allergen Reactions  . Penicillins Swelling, Rash and Other (See Comments)    Swelling in throat  Did it involve swelling of the face/tongue/throat, SOB, or low BP? yes Did it involve sudden or severe rash/hives, skin peeling, or any reaction on the inside of your mouth or nose? no Did you need to seek medical attention at a hospital or doctor's office? Yes When did it last happen?years  If all above answers are "NO", may proceed with cephalosporin use.    . Lidocaine Other (See Comments)    Unsure  At dentist's office    Patient Measurements: Height: 5\' 5"  (165.1 cm) Weight: 101 lb (45.8 kg) IBW/kg (Calculated) : 61.5 Heparin Dosing Weight: 45.8kg  Vital Signs: Temp: 98.4 F (36.9 C) (02/09 1032) Temp Source: Oral (02/09 1032) BP: 131/98 (02/09 1230) Pulse Rate: 102 (02/09 1230)  Labs: Recent Labs    04/07/19 1131  HGB 13.2  HCT 39.3  PLT 238  CREATININE 1.18  TROPONINIHS 169*    Estimated Creatinine Clearance: 42 mL/min (by C-G formula based on SCr of 1.18 mg/dL).   Medical History: Past Medical History:  Diagnosis Date  . AAA (abdominal aortic aneurysm) without rupture ([place])   . Abdominal aneurysm ([place]) 2015   being followed but not large enough to surgically treat  . Alcoholism ([place])   . Anxiety   . Arthritis   . Asthma   . Benign hypertension 07/16/2014  . Chest pain on exertion 07/16/2014  . Chronic pain syndrome   . Coccydynia 02/08/2012  . COPD (chronic obstructive pulmonary disease) ([place])    patient smokes  . Coronary artery disease   . 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 Depression   . 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 Dyspnea   . Frequent falls   . GERD (gastroesophageal reflux disease)   . Hyperlipidemia   . IBS (irritable bowel syndrome)   . Myocardial  infarction ([place]) 2010  . Neuropathy   . Pneumonia   . Prostatitis   . Restless leg syndrome   . Sciatica   . Spinal stenosis of lumbar region   . Stroke ([place]) 2015   no residual symptoms  . Tremor, essential     Medications:  No PTA anticoagulant of record  Assessment:  64 y.o. male with past medical history of CAD, COPD, hypertension, AAA, pulmonary fibrosis presents to the ED complaining worsening of shortness of breath and elevated troponins.  Pharmacy has been consulted to initiate and monitor a heparin drip.  Goal of Therapy:  Heparin level 0.3-0.7 units/ml Monitor platelets by anticoagulation protocol: Yes   Plan:  Give 3000 units bolus x 1 Start heparin infusion at 550 units/hr Check anti-Xa level in 6 hours and daily while on heparin Continue to monitor H&H and platelets  Lu Duffel, PharmD, BCPS Clinical Pharmacist 04/07/2019 12:55 PM

## 2019-04-07 NOTE — ED Notes (Signed)
Dr. Patel at bedside 

## 2019-04-07 NOTE — ED Triage Notes (Addendum)
Patient c/o sob for 13months that has gotten worse the last 2 weeks. Denies using oxygen at home. Reports hx of lung disease. Recent biopsy done of lung to rule out cancer

## 2019-04-07 NOTE — Plan of Care (Signed)
  Problem: Education: Goal: Knowledge of General Education information will improve Description: Including pain rating scale, medication(s)/side effects and non-pharmacologic comfort measures 04/07/2019 2320 by Joanne Gavel, RN Outcome: Progressing 04/07/2019 2319 by Joanne Gavel, RN Outcome: Progressing   Problem: Health Behavior/Discharge Planning: Goal: Ability to manage health-related needs will improve 04/07/2019 2320 by Joanne Gavel, RN Outcome: Progressing 04/07/2019 2319 by Joanne Gavel, RN Outcome: Progressing   Problem: Clinical Measurements: Goal: Ability to maintain clinical measurements within normal limits will improve Outcome: Progressing   Problem: Clinical Measurements: Goal: Respiratory complications will improve Outcome: Not Progressing Note: Patient is short of breath at rest.

## 2019-04-07 NOTE — ED Notes (Signed)
Dr. Charna Archer informed of elevated trop

## 2019-04-07 NOTE — Consult Note (Signed)
[facility] Cardiology  CARDIOLOGY CONSULT NOTE  Patient ID: Joseph Peters MRN: [id number] DOB/AGE: 1955/12/16 64 y.o.  Admit date: 04/07/2019 Referring Physician Posey Pronto Primary Physician [facility] Primary Cardiologist Fath Reason for Consultation elevated troponin  HPI: 64 year old gentleman referred for evaluation of elevated troponin.  Patient has known COPD.  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  Patient was scheduled to see his ID physician today, developed acute shortness of breath and presented to [facility] emergency room.  Upon questioning, patient denies chest pain.  ECG reveals sinus tachycardia with nonspecific T wave abnormalities laterally.  Admission labs notable for elevated high-sensitivity troponin I 69 and 129.  Previous [employer] 11/07/2018 revealed a mild inferior wall defect, followed by Dr. Ubaldo Glassing.  Patient clearly experiencing tachypnea with shortness of breath, use of accessory muscles.  Review of systems complete and found to be negative unless listed above     Past Medical History:  Diagnosis Date  . AAA (abdominal aortic aneurysm) without rupture ([place])   . Abdominal aneurysm ([place]) 2015   being followed but not large enough to surgically treat  . Alcoholism ([place])   . Anxiety   . Arthritis   . Asthma   . Benign hypertension 07/16/2014  . Chest pain on exertion 07/16/2014  . Chronic pain syndrome   . Coccydynia 02/08/2012  . COPD (chronic obstructive pulmonary disease) ([place])    patient smokes  . Coronary artery disease   . 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 Depression   . 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 Dyspnea   . Frequent falls   . GERD (gastroesophageal reflux disease)   . Hyperlipidemia   . IBS (irritable bowel syndrome)   . Myocardial infarction ([place]) 2010  . Neuropathy    . Pneumonia   . Prostatitis   . Restless leg syndrome   . Sciatica   . Spinal stenosis of lumbar region   . Stroke ([place]) 2015   no residual symptoms  . Tremor, essential     Past Surgical History:  Procedure Laterality Date  . CARDIAC CATHETERIZATION  2012  . COLONOSCOPY WITH PROPOFOL N/A 11/14/2016   Procedure: COLONOSCOPY WITH PROPOFOL;  Surgeon: Manya Silvas, MD;  Location: [facility] ENDOSCOPY;  Service: Endoscopy;  Laterality: N/A;  . ESOPHAGOGASTRODUODENOSCOPY (EGD) WITH PROPOFOL N/A 11/14/2016   Procedure: ESOPHAGOGASTRODUODENOSCOPY (EGD) WITH PROPOFOL;  Surgeon: Manya Silvas, MD;  Location: [facility] ENDOSCOPY;  Service: Endoscopy;  Laterality: N/A;  . JOINT REPLACEMENT Right    hip  . KNEE SURGERY     right  . SHOULDER ARTHROSCOPY WITH OPEN ROTATOR CUFF REPAIR Right 08/20/2017   Procedure: SHOULDER ARTHROSCOPY WITH OPEN ROTATOR CUFF REPAIR;  Surgeon: Corky Mull, MD;  Location: [facility] ORS;  Service: Orthopedics;  Laterality: Right;  . SHOULDER ARTHROSCOPY WITH SUBACROMIAL DECOMPRESSION, ROTATOR CUFF REPAIR AND BICEP TENDON REPAIR Left 05/10/2016   Procedure: SHOULDER ARTHROSCOPY WITH DEBTRIDEMENT, DECOMPRESSION, REPAIR OF MASSIVE ROTATOR CUFF TEAR AND BICEP TENODESIS;  Surgeon: Corky Mull, MD;  Location: [facility] ORS;  Service: Orthopedics;  Laterality: Left;  Massive cuff tear  . TOTAL HIP ARTHROPLASTY Right 07/01/2018   Procedure: TOTAL HIP ARTHROPLASTY - RIGHT;  Surgeon: Corky Mull, MD;  Location: [facility] ORS;  Service: Orthopedics;  Laterality: Right;  Marland Kitchen VIDEO BRONCHOSCOPY WITH ENDOBRONCHIAL NAVIGATION N/A 02/13/2019   Procedure: VIDEO BRONCHOSCOPY WITH ENDOBRONCHIAL  NAVIGATION;  Surgeon: Ottie Glazier, MD;  Location: [facility] ORS;  Service: Thoracic;  Laterality: N/A;  . VIDEO BRONCHOSCOPY WITH ENDOBRONCHIAL NAVIGATION N/A 03/13/2019   Procedure: VIDEO BRONCHOSCOPY WITH ENDOBRONCHIAL NAVIGATION;  Surgeon: Ottie Glazier, MD;  Location: [facility] ORS;  Service: Thoracic;  Laterality: N/A;   . VIDEO BRONCHOSCOPY WITH ENDOBRONCHIAL ULTRASOUND N/A 02/13/2019   Procedure: VIDEO BRONCHOSCOPY WITH ENDOBRONCHIAL ULTRASOUND;  Surgeon: Ottie Glazier, MD;  Location: [facility] ORS;  Service: Thoracic;  Laterality: N/A;  . VIDEO BRONCHOSCOPY WITH ENDOBRONCHIAL ULTRASOUND N/A 03/13/2019   Procedure: VIDEO BRONCHOSCOPY WITH ENDOBRONCHIAL ULTRASOUND;  Surgeon: Ottie Glazier, MD;  Location: [facility] ORS;  Service: Thoracic;  Laterality: N/A;    Medications Prior to Admission  Medication Sig Dispense Refill Last Dose  . albuterol (PROVENTIL HFA;VENTOLIN HFA) 108 (90 Base) MCG/ACT inhaler Inhale 2 puffs into the lungs every 4 (four) hours as needed for wheezing or shortness of breath.    prn at prn  . DALIRESP 250 MCG TABS Take 250 mcg by mouth at bedtime. 30 tablet 0 04/06/2019 at Unknown time  . finasteride (PROSCAR) 5 MG tablet Take 1 tablet (5 mg total) by mouth daily. 30 tablet 0 04/07/2019 at Unknown time  . FLUoxetine (PROZAC) 20 MG capsule Take 20 mg by mouth daily.    04/07/2019 at Unknown time  . Fluticasone-Umeclidin-Vilant (TRELEGY ELLIPTA) 100-62.5-25 MCG/INH AEPB Inhale 2 puffs into the lungs at bedtime.    04/06/2019 at Unknown time  . gabapentin (NEURONTIN) 300 MG capsule Take 900 mg by mouth at bedtime.    04/06/2019 at Unknown time  . losartan (COZAAR) 25 MG tablet Take 25 mg by mouth daily.    04/07/2019 at Unknown time  . montelukast (SINGULAIR) 10 MG tablet Take 10 mg by mouth at bedtime.   04/06/2019 at Unknown time  . omeprazole (PRILOSEC) 20 MG capsule Take 20 mg by mouth daily.   04/07/2019 at unknown  . ondansetron (ZOFRAN ODT) 4 MG disintegrating tablet Take 1 tablet (4 mg total) by mouth every 8 (eight) hours as needed for nausea or vomiting. 20 tablet 0 prn at prn  . Oxycodone HCl 10 MG TABS Take 10 mg by mouth 4 (four) times daily as needed for pain.   prn at prn  . rosuvastatin (CRESTOR) 5 MG tablet Take 5 mg by mouth at bedtime.   unknown at unknown  . tamsulosin (FLOMAX) 0.4 MG CAPS  capsule Take 1 capsule (0.4 mg total) by mouth daily after supper. 90 capsule 1 04/06/2019 at Unknown time   Social History   Socioeconomic History  . Marital status: Married    Spouse name: Not on file  . Number of children: Not on file  . Years of education: Not on file  . Highest education level: Not on file  Occupational History  . Not on file  Tobacco Use  . Smoking status: Light Tobacco Smoker    Packs/day: 0.50    Years: 30.00    Pack years: 15.00    Types: Cigarettes    Start date: 02/26/1973    Last attempt to quit: 09/21/2018    Years since quitting: 0.5  . Smokeless tobacco: Former Systems developer    Quit date: 02/26/1998  . Tobacco comment: smokes 2-3 cig a week 02/12/19  Substance and Sexual Activity  . Alcohol use: Yes    Alcohol/week: 20.0 standard drinks    Types: 20 Cans of beer per week    Comment: week   . Drug use: Yes    Comment:  prescribed hydrocodone  . Sexual activity: Not on file  Other Topics Concern  . Not on file  Social History Narrative   Patient is a Nature conservation officer.;  He lives with his wife at home.  Longstanding history of smoking; quit recently.  Denies alcohol abuse.   Social Determinants of Health   Financial Resource Strain:   . Difficulty of Paying Living Expenses: Not on file  Food Insecurity:   . Worried About Charity fundraiser in the Last Year: Not on file  . Ran Out of Food in the Last Year: Not on file  Transportation Needs:   . Lack of Transportation (Medical): Not on file  . Lack of Transportation (Non-Medical): Not on file  Physical Activity:   . Days of Exercise per Week: Not on file  . Minutes of Exercise per Session: Not on file  Stress:   . Feeling of Stress : Not on file  Social Connections:   . Frequency of Communication with Friends and Family: Not on file  . Frequency of Social Gatherings with Friends and Family: Not on file  . Attends Religious Services: Not on file  . Active Member of Clubs or Organizations: Not on  file  . Attends Archivist Meetings: Not on file  . Marital Status: Not on file  Intimate Partner Violence:   . Fear of Current or Ex-Partner: Not on file  . Emotionally Abused: Not on file  . Physically Abused: Not on file  . Sexually Abused: Not on file    Family History  Problem Relation Age of Onset  . Cancer Mother   . COPD Father 70       black lung      Review of systems complete and found to be negative unless listed above      PHYSICAL EXAM  General: Well developed, well nourished, in no acute distress HEENT:  Normocephalic and atramatic Neck:  No JVD.  Lungs: Clear bilaterally to auscultation and percussion. Heart: HRRR . Normal S1 and S2 without gallops or murmurs.  Abdomen: Bowel sounds are positive, abdomen soft and non-tender  Msk:  Back normal, normal gait. Normal strength and tone for age. Extremities: No clubbing, cyanosis or edema.   Neuro: Alert and oriented X 3. Psych:  Good affect, responds appropriately  Labs:   Lab Results  Component Value Date   WBC 9.6 04/07/2019   HGB 13.2 04/07/2019   HCT 39.3 04/07/2019   MCV 88.1 04/07/2019   PLT 238 04/07/2019    Recent Labs  Lab 04/07/19 1131  NA 134*  K 4.0  CL 97*  CO2 24  BUN 15  CREATININE 1.18  CALCIUM 8.7*  GLUCOSE 104*   Lab Results  Component Value Date   TROPONINI <0.03 06/15/2016   No results found for: CHOL No results found for: HDL No results found for: LDLCALC No results found for: TRIG No results found for: CHOLHDL No results found for: LDLDIRECT    Radiology: DG Chest 2 View  Result Date: 04/07/2019 CLINICAL DATA:  Chest pain and shortness of breath EXAM: CHEST - 2 VIEW COMPARISON:  March 13, 2019 chest radiograph and chest CT February 13, 2019 FINDINGS: There remains consolidation with volume loss in the right upper lobe with slightly improved aeration in this area compared to prior studies. Elsewhere, there is extensive underlying emphysematous change with  fibrosis. Fibrosis is most severe in the right base region. No new opacity evident. Heart size and pulmonary vascularity are  normal. No adenopathy is evident. There is aortic atherosclerosis. No bone lesions. There is calcification in each carotid artery. IMPRESSION: Underlying emphysematous change in fibrosis. Consolidation in the right upper lobe is slightly less pronounced than on recent prior studies. No new opacity evident. Stable cardiac silhouette. No adenopathy demonstrable by radiography. Aortic Atherosclerosis (ICD10-I70.0). Electronically Signed   By: Lowella Grip III M.D.   On: 04/07/2019 11:20   CT Angio Chest PE W/Cm &/Or Wo Cm  Result Date: 04/07/2019 CLINICAL DATA:  Shortness of breath. EXAM: CT ANGIOGRAPHY CHEST WITH CONTRAST TECHNIQUE: Multidetector CT imaging of the chest was performed using the standard protocol during bolus administration of intravenous contrast. Multiplanar CT image reconstructions and MIPs were obtained to evaluate the vascular anatomy. CONTRAST:  29m OMNIPAQUE IOHEXOL 350 MG/ML SOLN COMPARISON:  Chest radiograph April 07, 2019; chest CT February 13, 2019 FINDINGS: Cardiovascular: There is no demonstrable pulmonary embolus. Ascending thoracic aortic diameter measures 4.6 x 4.5 cm. No dissection is appreciable. The contrast bolus in the aorta is somewhat less than optimal for dissection assessment. There is localized aneurysmal dilatation in the distal descending thoracic aorta at the diaphragmatic hiatus with localized plaque and calcification along the leftward aspect of this aneurysm. The descending thoracic aorta at the level of the diaphragmatic hiatus measures 3.8 x 3.1 cm. Appearance of this aneurysm is similar to most recent CT. There is aortic atherosclerosis as well as scattered foci of calcification in proximal visualized great vessels. There are foci of coronary artery calcification. There is left ventricular hypertrophy. No pericardial effusion or  pericardial thickening. Mediastinum/Nodes: Thyroid appears unremarkable. There are mildly prominent right hilar lymph nodes, largest measuring 1.5 x 1.3 cm. Subcarinal lymph node measures 1.4 x 1.2 cm. No esophageal lesions are evident. Lungs/Pleura: There is a moderate right pleural effusion with a much smaller left pleural effusion. There is extensive underlying emphysematous change with fibrosis in the lower lung regions, significantly more on the right than on the left. There are multiple areas of cicatrization throughout the lungs bilaterally, more severe on the right than on the left. There is consolidation in the anterior segment right upper lobe with air bronchograms and volume loss. No similar consolidation elsewhere. Upper Abdomen: There is upper abdominal aortic atherosclerosis. Visualized upper abdominal structures otherwise appear unremarkable. Musculoskeletal: Anterior wedging of a midthoracic vertebral body is stable. No blastic or lytic bone lesions. No evident chest wall lesions. Review of the MIP images confirms the above findings. IMPRESSION: 1.  No demonstrable pulmonary embolus. 2. Prominence of the ascending thoracic aorta with a measured diameter of 4.6 x 4.5 cm. Ascending thoracic aortic aneurysm. Recommend semi-annual imaging followup by CTA or MRA and referral to cardiothoracic surgery if not already obtained. This recommendation follows 2010 ACCF/AHA/AATS/ACR/ASA/SCA/SCAI/SIR/STS/SVM Guidelines for the Diagnosis and Management of Patients With Thoracic Aortic Disease. Circulation. 2010; 121:: [id number] Aortic aneurysm NOS (ICD10-I71.9). No dissection evident with contrast bolus somewhat less than optimal for dissection assessment. 3. Localized aneurysmal dilatation of the descending thoracic aorta at the diaphragmatic hiatus level measuring 3.8 x 3.1 cm. Localized plaque and dilatation along the leftward aspect of the aneurysm, also present previously. Note that there is aortic  atherosclerosis as well as foci of great vessel and coronary artery calcification. 4. 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 Pleural effusions bilaterally, larger on the right than on the left. Persistent consolidation in the right upper lobe anteriorly with air bronchograms. Appearance most consistent with pneumonia with possibility  of superimposed neoplasm not excluded anteriorly toward the apex on the right. 5.  Prominent right hilar and subcarinal lymph nodes. Aortic Atherosclerosis (ICD10-I70.0) and Emphysema (ICD10-J43.9). Aortic aneurysm NOS (ICD10-I71.9). Electronically Signed   By: Lowella Grip III M.D.   On: 04/07/2019 13:17   DG Chest Port 1 View  Result Date: 03/13/2019 CLINICAL DATA:  Status post bronchoscopy EXAM: PORTABLE CHEST 1 VIEW COMPARISON:  12/23/2018 FINDINGS: The heart size and mediastinal contours are within normal limits. Redemonstrated masslike consolidation of the anterior right upper lobe. Emphysema. The visualized skeletal structures are unremarkable. IMPRESSION: 1. Redemonstrated masslike consolidation of the anterior right upper lobe. No acute appearing airspace opacity. 2.  Emphysema. Electronically Signed   By: Eddie Candle M.D.   On: 03/13/2019 16:17   DG C-Arm 1-60 Min-No Report  Result Date: 03/13/2019 Fluoroscopy was utilized by the requesting physician.  No radiographic interpretation.    EKG: Sinus tachycardia with nonspecific T wave abnormalities laterally  ASSESSMENT AND PLAN:   1.  Borderline elevated high-sensitivity troponin (169, 129 ), with abnormal ECG, in the absence of chest pain, in the setting of once of breath with tachypnea, recent diagnosis of MAC 2.  MAC, on azithromycin and rifampin, recent negative right upper lobe biopsy 3.  COPD 4.  Abdominal aortic aneurysm, 5 cm in diameter  Recommendations  1.  Agree with current therapy 2.  Continue heparin drip for now 3.  Recycle  troponins 4.  2D echocardiogram 5.  Further recommendations pending initial clinical course and echocardiogram findings  Signed: Isaias Cowman MD,PhD, [facility] 04/07/2019, 3:42 PM

## 2019-04-07 NOTE — ED Notes (Signed)
Lab to add blue top tube to labs previously sent

## 2019-04-07 NOTE — Consult Note (Signed)
ANTICOAGULATION CONSULT NOTE - Initial Consult  Pharmacy Consult for Heparin Drip Indication: chest pain/ACS/STEMI  Allergies  Allergen Reactions  . Penicillins Swelling, Rash and Other (See Comments)    Swelling in throat  Did it involve swelling of the face/tongue/throat, SOB, or low BP? yes Did it involve sudden or severe rash/hives, skin peeling, or any reaction on the inside of your mouth or nose? no Did you need to seek medical attention at a hospital or doctor's office? Yes When did it last happen?years  If all above answers are "NO", may proceed with cephalosporin use.    . Lidocaine Other (See Comments)    Unsure  At dentist's office    Patient Measurements: Height: 5\' 5"  (165.1 cm) Weight: 120 lb 9.6 oz (54.7 kg) IBW/kg (Calculated) : 61.5 Heparin Dosing Weight: 45.8kg  Vital Signs: Temp: 98.1 F (36.7 C) (02/09 1537) Temp Source: Oral (02/09 1537) BP: 121/92 (02/09 1537) Pulse Rate: 98 (02/09 1537)  Labs: Recent Labs    04/07/19 1131 04/07/19 1326 04/07/19 1534  HGB 13.2  --   --   HCT 39.3  --   --   PLT 238  --   --   APTT 32  --   --   LABPROT 13.9  --   --   INR 1.1  --   --   CREATININE 1.18  --   --   TROPONINIHS 169* 129* 119*    Estimated Creatinine Clearance: 50.2 mL/min (by C-G formula based on SCr of 1.18 mg/dL).   Medical History: Past Medical History:  Diagnosis Date  . AAA (abdominal aortic aneurysm) without rupture ([place])   . Abdominal aneurysm ([place]) 2015   being followed but not large enough to surgically treat  . Alcoholism ([place])   . Anxiety   . Arthritis   . Asthma   . Benign hypertension 07/16/2014  . Chest pain on exertion 07/16/2014  . Chronic pain syndrome   . Coccydynia 02/08/2012  . COPD (chronic obstructive pulmonary disease) ([place])    patient smokes  . Coronary artery disease   . 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 Depression   . 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  Dyspnea   . Frequent falls   . GERD (gastroesophageal reflux disease)   . Hyperlipidemia   . IBS (irritable bowel syndrome)   . Myocardial infarction ([place]) 2010  . Neuropathy   . Pneumonia   . Prostatitis   . Restless leg syndrome   . Sciatica   . Spinal stenosis of lumbar region   . Stroke ([place]) 2015   no residual symptoms  . Tremor, essential     Medications:  No PTA anticoagulant of record  Assessment:  64 y.o. male with past medical history of CAD, COPD, hypertension, AAA, pulmonary fibrosis presents to the ED complaining worsening of shortness of breath and elevated troponins.  Pharmacy was consulted to initiate and monitor a heparin drip.  Pt was never given 3000 units bolus, and heparin infusion was started at 550 units/hr  2/9 @ 1840  HL  < 0.10  Goal of Therapy:  Heparin level 0.3-0.7 units/ml Monitor platelets by anticoagulation protocol: Yes   Plan:  Will give the original bolus of 3000 units (spoke with nursing) and increase rate to 750 units/hr  Will check anti-Xa level in 6 hours per protocol  Will continue to monitor H&H and platelets  Lu Duffel, PharmD, BCPS Clinical Pharmacist 04/07/2019  6:53 PM

## 2019-04-07 NOTE — ED Notes (Signed)
Report given to Ashley RN

## 2019-04-07 NOTE — ED Provider Notes (Signed)
[facility] Emergency Department Provider Note   ____________________________________________   First MD Initiated Contact with Patient 04/07/19 1046     (approximate)  I have reviewed the triage vital signs and the nursing notes.   HISTORY  Chief Complaint Shortness of Breath    HPI Joseph Peters is a 64 y.o. male with past medical history of CAD, COPD, hypertension, AAA, pulmonary fibrosis presents to the ED complaining of shortness of breath.  Patient reports that he has dealt with shortness of breath for a long time but it has seemed acutely worse over the past week.  It is associated with a productive cough as well as pain in his chest after he coughs, but he denies any recent fevers.  He was recently diagnosed with MAC and was started on antibiotics for this last week, but he states he has had difficulty tolerating these due to vomiting and diarrhea.  He was told to stop the medications until he could follow-up with infectious disease earlier today, however he was significantly short of breath when he arrived there and was referred to the ED for further evaluation.  He has been using his albuterol inhaler at home without significant relief.        Past Medical History:  Diagnosis Date  . AAA (abdominal aortic aneurysm) without rupture ([place])   . Abdominal aneurysm ([place]) 2015   being followed but not large enough to surgically treat  . Alcoholism ([place])   . Anxiety   . Arthritis   . Asthma   . Benign hypertension 07/16/2014  . Chest pain on exertion 07/16/2014  . Chronic pain syndrome   . Coccydynia 02/08/2012  . COPD (chronic obstructive pulmonary disease) ([place])    patient smokes  . Coronary artery disease   . 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 Depression   . 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 Dyspnea   . Frequent falls   . GERD (gastroesophageal reflux disease)   . Hyperlipidemia   . IBS  (irritable bowel syndrome)   . Myocardial infarction ([place]) 2010  . Neuropathy   . Pneumonia   . Prostatitis   . Restless leg syndrome   . Sciatica   . Spinal stenosis of lumbar region   . Stroke ([place]) 2015   no residual symptoms  . Tremor, essential     Patient Active Problem List   Diagnosis Date Noted  . NSTEMI (non-ST elevated myocardial infarction) ([place]) 04/07/2019  . Restless legs syndrome (RLS) 04/01/2019  . Protein-calorie malnutrition, severe 10/15/2018  . Nodule of upper lobe of right lung 10/15/2018  . RLQ abdominal pain 10/14/2018  . Status post total hip replacement, right 07/01/2018  . Strain of right knee 06/26/2018  . Lumbar strain 06/16/2018  . Strain of right hip 06/16/2018  . Hyperlipidemia 01/30/2018  . Rotator cuff tendinitis, right 08/19/2017  . Traumatic complete tear of right rotator cuff 08/19/2017  . Ascending aortic aneurysm ([place]) 08/01/2017  . Tremor, essential 05/28/2017  . Gastroesophageal reflux disease without esophagitis 12/25/2016  . Renal artery stenosis ([place]) 12/05/2015  . 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 Syncope and collapse 12/05/2015  . Essential hypertension 10/15/2015  . COPD exacerbation ([place]) 01/31/2015  . Acute respiratory failure ([place]) 01/31/2015  . Drug allergy 01/30/2015  . AAA (abdominal aortic aneurysm) ([place]) 11/23/2014  . Prostate cancer screening 11/02/2014  . Gross hematuria 11/02/2014  . Chest pain 07/16/2014  . Benign hypertension  07/16/2014  . 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 Occlusion and stenosis of unspecified carotid artery 05/14/2012  . Coccydynia 02/08/2012  . 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 Gait disorder 02/08/2012  . Spinal stenosis of lumbar region 01/13/2009  . Atherosclerotic heart disease of native coronary artery without angina pectoris 06/28/2008  . Sciatica 06/28/2008  . Recurrent major depressive disorder in  partial remission ([place]) 06/28/2008    Past Surgical History:  Procedure Laterality Date  . CARDIAC CATHETERIZATION  2012  . COLONOSCOPY WITH PROPOFOL N/A 11/14/2016   Procedure: COLONOSCOPY WITH PROPOFOL;  Surgeon: Manya Silvas, MD;  Location: [facility] ENDOSCOPY;  Service: Endoscopy;  Laterality: N/A;  . ESOPHAGOGASTRODUODENOSCOPY (EGD) WITH PROPOFOL N/A 11/14/2016   Procedure: ESOPHAGOGASTRODUODENOSCOPY (EGD) WITH PROPOFOL;  Surgeon: Manya Silvas, MD;  Location: [facility] ENDOSCOPY;  Service: Endoscopy;  Laterality: N/A;  . JOINT REPLACEMENT Right    hip  . KNEE SURGERY     right  . SHOULDER ARTHROSCOPY WITH OPEN ROTATOR CUFF REPAIR Right 08/20/2017   Procedure: SHOULDER ARTHROSCOPY WITH OPEN ROTATOR CUFF REPAIR;  Surgeon: Corky Mull, MD;  Location: [facility] ORS;  Service: Orthopedics;  Laterality: Right;  . SHOULDER ARTHROSCOPY WITH SUBACROMIAL DECOMPRESSION, ROTATOR CUFF REPAIR AND BICEP TENDON REPAIR Left 05/10/2016   Procedure: SHOULDER ARTHROSCOPY WITH DEBTRIDEMENT, DECOMPRESSION, REPAIR OF MASSIVE ROTATOR CUFF TEAR AND BICEP TENODESIS;  Surgeon: Corky Mull, MD;  Location: [facility] ORS;  Service: Orthopedics;  Laterality: Left;  Massive cuff tear  . TOTAL HIP ARTHROPLASTY Right 07/01/2018   Procedure: TOTAL HIP ARTHROPLASTY - RIGHT;  Surgeon: Corky Mull, MD;  Location: [facility] ORS;  Service: Orthopedics;  Laterality: Right;  Marland Kitchen VIDEO BRONCHOSCOPY WITH ENDOBRONCHIAL NAVIGATION N/A 02/13/2019   Procedure: VIDEO BRONCHOSCOPY WITH ENDOBRONCHIAL NAVIGATION;  Surgeon: Ottie Glazier, MD;  Location: [facility] ORS;  Service: Thoracic;  Laterality: N/A;  . VIDEO BRONCHOSCOPY WITH ENDOBRONCHIAL NAVIGATION N/A 03/13/2019   Procedure: VIDEO BRONCHOSCOPY WITH ENDOBRONCHIAL NAVIGATION;  Surgeon: Ottie Glazier, MD;  Location: [facility] ORS;  Service: Thoracic;  Laterality: N/A;  . VIDEO BRONCHOSCOPY WITH ENDOBRONCHIAL ULTRASOUND N/A 02/13/2019   Procedure: VIDEO BRONCHOSCOPY WITH ENDOBRONCHIAL ULTRASOUND;  Surgeon:  Ottie Glazier, MD;  Location: [facility] ORS;  Service: Thoracic;  Laterality: N/A;  . VIDEO BRONCHOSCOPY WITH ENDOBRONCHIAL ULTRASOUND N/A 03/13/2019   Procedure: VIDEO BRONCHOSCOPY WITH ENDOBRONCHIAL ULTRASOUND;  Surgeon: Ottie Glazier, MD;  Location: [facility] ORS;  Service: Thoracic;  Laterality: N/A;    Prior to Admission medications   Medication Sig Start Date End Date Taking? Authorizing Provider  albuterol (PROVENTIL HFA;VENTOLIN HFA) 108 (90 Base) MCG/ACT inhaler Inhale 2 puffs into the lungs every 4 (four) hours as needed for wheezing or shortness of breath.    Yes [provider]  DALIRESP 250 MCG TABS Take 250 mcg by mouth at bedtime. 10/17/18  Yes Ojie, Jude, MD  finasteride (PROSCAR) 5 MG tablet Take 1 tablet (5 mg total) by mouth daily. 03/02/19  Yes Festus Aloe, MD  FLUoxetine (PROZAC) 20 MG capsule Take 20 mg by mouth daily.    Yes [provider]  Fluticasone-Umeclidin-Vilant (TRELEGY ELLIPTA) 100-62.5-25 MCG/INH AEPB Inhale 2 puffs into the lungs at bedtime.  08/18/18  Yes [provider]  gabapentin (NEURONTIN) 300 MG capsule Take 900 mg by mouth at bedtime.    Yes [provider]  losartan (COZAAR) 25 MG tablet Take 25 mg by mouth daily.    Yes [provider]  montelukast (SINGULAIR) 10 MG  tablet Take 10 mg by mouth at bedtime.   Yes [provider]  omeprazole (PRILOSEC) 20 MG capsule Take 20 mg by mouth daily.   Yes [provider]  ondansetron (ZOFRAN ODT) 4 MG disintegrating tablet Take 1 tablet (4 mg total) by mouth every 8 (eight) hours as needed for nausea or vomiting. 04/02/19  Yes Tsosie Billing, MD  Oxycodone HCl 10 MG TABS Take 10 mg by mouth 4 (four) times daily as needed for pain. 09/29/18  Yes [provider]  rosuvastatin (CRESTOR) 5 MG tablet Take 5 mg by mouth at bedtime.   Yes [provider]  tamsulosin (FLOMAX) 0.4 MG CAPS capsule Take 1 capsule (0.4 mg total) by mouth daily  after supper. 12/22/18  Yes Festus Aloe, MD    Allergies Penicillins and Lidocaine  Family History  Problem Relation Age of Onset  . Cancer Mother   . COPD Father 81       black lung    Social History Social History   Tobacco Use  . Smoking status: Light Tobacco Smoker    Packs/day: 0.50    Years: 30.00    Pack years: 15.00    Types: Cigarettes    Start date: 02/26/1973    Last attempt to quit: 09/21/2018    Years since quitting: 0.5  . Smokeless tobacco: Former Systems developer    Quit date: 02/26/1998  . Tobacco comment: smokes 2-3 cig a week 02/12/19  Substance Use Topics  . Alcohol use: Yes    Alcohol/week: 20.0 standard drinks    Types: 20 Cans of beer per week    Comment: week   . Drug use: Yes    Comment: prescribed hydrocodone    Review of Systems  Constitutional: No fever/chills Eyes: No visual changes. ENT: No sore throat. Cardiovascular: Positive for chest pain. Respiratory: Positive for cough and shortness of breath. Gastrointestinal: No abdominal pain.  Positive for nausea, vomiting, and diarrhea.  No constipation. Genitourinary: Negative for dysuria. Musculoskeletal: Negative for back pain. Skin: Negative for rash. Neurological: Negative for headaches, focal weakness or numbness.  ____________________________________________   PHYSICAL EXAM:  VITAL SIGNS: ED Triage Vitals  Enc Vitals Group     BP 04/07/19 1032 129/90     Pulse Rate 04/07/19 1032 (!) 120     Resp 04/07/19 1032 (!) 22     Temp 04/07/19 1032 98.4 F (36.9 C)     Temp Source 04/07/19 1032 Oral     SpO2 04/07/19 1032 93 %     Weight 04/07/19 1040 101 lb (45.8 kg)     Height 04/07/19 1040 _0  (1.651 m)     Head Circumference --      Peak Flow --      Pain Score 04/07/19 1040 8     Pain Loc --      Pain Edu? --      Excl. in [place]? --     Constitutional: Alert and oriented. Eyes: Conjunctivae are normal. Head: Atraumatic. Nose: No congestion/rhinnorhea. Mouth/Throat: Mucous  membranes are moist. Neck: Normal ROM Cardiovascular: Tachycardic, regular rhythm. Grossly normal heart sounds. Respiratory: Tachypneic with increased respiratory effort.  No retractions. Lungs with expiratory wheezing throughout, crackles over right base. Gastrointestinal: Soft and nontender. No distention. Genitourinary: deferred Musculoskeletal: No lower extremity tenderness nor edema. Neurologic:  Normal speech and language. No gross focal neurologic deficits are appreciated. Skin:  Skin is warm, dry and intact. No rash noted. Psychiatric: Mood and affect are normal. Speech and  behavior are normal.  ____________________________________________   LABS (all labs ordered are listed, but only abnormal results are displayed)  Labs Reviewed  BASIC METABOLIC PANEL - Abnormal; Notable for the following components:      Result Value   Sodium 134 (*)    Chloride 97 (*)    Glucose, Bld 104 (*)    Calcium 8.7 (*)    All other components within normal limits  T4, FREE - Abnormal; Notable for the following components:   Free T4 1.17 (*)    All other components within normal limits  BRAIN NATRIURETIC PEPTIDE - Abnormal; Notable for the following components:   B Natriuretic Peptide 3,036.0 (*)    All other components within normal limits  TROPONIN I (HIGH SENSITIVITY) - Abnormal; Notable for the following components:   Troponin I (High Sensitivity) 169 (*)    All other components within normal limits  TROPONIN I (HIGH SENSITIVITY) - Abnormal; Notable for the following components:   Troponin I (High Sensitivity) 129 (*)    All other components within normal limits  RESPIRATORY PANEL BY RT PCR (FLU A&B, COVID)  CBC  APTT  PROTIME-INR  TSH  HEPARIN LEVEL (UNFRACTIONATED)  TROPONIN I (HIGH SENSITIVITY)  TROPONIN I (HIGH SENSITIVITY)   ____________________________________________  EKG  ED ECG REPORT I, Blake Divine, the attending physician, personally viewed and interpreted this  ECG.   Date: 04/07/2019  EKG Time: 10:38  Rate: 115  Rhythm: sinus tachycardia  Axis: LAD  Intervals:none  ST&T Change: Inferolateral T wave changes   PROCEDURES  Procedure(s) performed (including Critical Care):  Procedures   ____________________________________________   INITIAL IMPRESSION / ASSESSMENT AND PLAN / ED COURSE       64 year old male with history of COPD, CAD, pulmonary fibrosis, and recent diagnosis of pulmonary MAC presents to the ED with acute on chronic shortness of breath over about the past week.  He is tachypneic with increased respiratory effort, however maintaining O2 sats on room air.  He does have some wheezing on exam, will treat with prednisone and albuterol.  We will also check CTA to rule out PE given his tachycardia and shortness of breath out of proportion to the level of his wheezing.  Chest x-ray appears grossly similar to prior with changes concerning for MAC, he did have recent biopsy that was negative for malignancy.  He complains of some chest pain, however this is with coughing and I have low suspicion for ACS.  CTA negative for PE, does show patient's known ascending and descending aortic aneurysms but no evidence of dissection or extravasation.  There are also significant changes consistent with patient's known diagnosis of pulmonary MAC.  Troponin noted to be elevated at greater than 160 and although his symptoms are atypical, he had an abnormal stress test last year with no follow-up interventions, noted to have T wave inversions on EKG today.  Case discussed with Dr. Saralyn Pilar of cardiology, who will evaluate and agrees with plan for heparin drip for now.      ____________________________________________   FINAL CLINICAL IMPRESSION(S) / ED DIAGNOSES  Final diagnoses:  Shortness of breath  Chest pain, unspecified type  COPD exacerbation ([place])  History of MAC infection     ED Discharge Orders    None       Note:  This  document was prepared using Dragon voice recognition software and may include unintentional dictation errors.   Blake Divine, MD 04/07/19 [phone]

## 2019-04-08 ENCOUNTER — Inpatient Hospital Stay: Payer: Medicare Other

## 2019-04-08 DIAGNOSIS — Z96641 Presence of right artificial hip joint: Secondary | ICD-10-CM

## 2019-04-08 DIAGNOSIS — J90 Pleural effusion, not elsewhere classified: Secondary | ICD-10-CM

## 2019-04-08 DIAGNOSIS — J449 Chronic obstructive pulmonary disease, unspecified: Secondary | ICD-10-CM

## 2019-04-08 DIAGNOSIS — Z96611 Presence of right artificial shoulder joint: Secondary | ICD-10-CM

## 2019-04-08 DIAGNOSIS — A310 Pulmonary mycobacterial infection: Principal | ICD-10-CM

## 2019-04-08 DIAGNOSIS — Z880 Allergy status to penicillin: Secondary | ICD-10-CM

## 2019-04-08 DIAGNOSIS — Z96612 Presence of left artificial shoulder joint: Secondary | ICD-10-CM

## 2019-04-08 DIAGNOSIS — Z884 Allergy status to anesthetic agent status: Secondary | ICD-10-CM

## 2019-04-08 DIAGNOSIS — R55 Syncope and collapse: Secondary | ICD-10-CM

## 2019-04-08 DIAGNOSIS — R778 Other specified abnormalities of plasma proteins: Secondary | ICD-10-CM

## 2019-04-08 DIAGNOSIS — I714 Abdominal aortic aneurysm, without rupture: Secondary | ICD-10-CM

## 2019-04-08 DIAGNOSIS — Z720 Tobacco use: Secondary | ICD-10-CM

## 2019-04-08 DIAGNOSIS — I214 Non-ST elevation (NSTEMI) myocardial infarction: Secondary | ICD-10-CM

## 2019-04-08 DIAGNOSIS — J9601 Acute respiratory failure with hypoxia: Secondary | ICD-10-CM

## 2019-04-08 LAB — BODY FLUID CELL COUNT WITH DIFFERENTIAL
Eos, Fluid: 0 %
Lymphs, Fluid: 35 %
Monocyte-Macrophage-Serous Fluid: 50 %
Neutrophil Count, Fluid: 15 %
Total Nucleated Cell Count, Fluid: 798 cu mm

## 2019-04-08 LAB — CBC
HCT: 32.3 % — ABNORMAL LOW (ref 39.0–52.0)
Hemoglobin: 10.8 g/dL — ABNORMAL LOW (ref 13.0–17.0)
MCH: 29.9 pg (ref 26.0–34.0)
MCHC: 33.4 g/dL (ref 30.0–36.0)
MCV: 89.5 fL (ref 80.0–100.0)
Platelets: 190 10*3/uL (ref 150–400)
RBC: 3.61 MIL/uL — ABNORMAL LOW (ref 4.22–5.81)
RDW: 13.8 % (ref 11.5–15.5)
WBC: 8 10*3/uL (ref 4.0–10.5)
nRBC: 0 % (ref 0.0–0.2)

## 2019-04-08 LAB — ECHOCARDIOGRAM COMPLETE
Height: 65 in
Weight: 1929.6 oz

## 2019-04-08 LAB — GLUCOSE, PLEURAL OR PERITONEAL FLUID: Glucose, Fluid: 103 mg/dL

## 2019-04-08 LAB — HEPARIN LEVEL (UNFRACTIONATED): Heparin Unfractionated: 0.12 IU/mL — ABNORMAL LOW (ref 0.30–0.70)

## 2019-04-08 LAB — LACTATE DEHYDROGENASE, PLEURAL OR PERITONEAL FLUID: LD, Fluid: 106 U/L — ABNORMAL HIGH (ref 3–23)

## 2019-04-08 MED ORDER — IPRATROPIUM-ALBUTEROL 0.5-2.5 (3) MG/3ML IN SOLN
3.0000 mL | Freq: Four times a day (QID) | RESPIRATORY_TRACT | Status: DC
  Administered 2019-04-08 – 2019-04-09 (×2): 3 mL via RESPIRATORY_TRACT
  Filled 2019-04-08 (×3): qty 3

## 2019-04-08 MED ORDER — CHLOROPROCAINE HCL (PF) 2 % IJ SOLN
10.0000 mL | Freq: Once | INTRAMUSCULAR | Status: DC
  Filled 2019-04-08: qty 10

## 2019-04-08 MED ORDER — RIFAMPIN 300 MG PO CAPS
600.0000 mg | ORAL_CAPSULE | Freq: Every day | ORAL | Status: DC
  Filled 2019-04-08: qty 2

## 2019-04-08 MED ORDER — RIFAMPIN 300 MG PO CAPS
600.0000 mg | ORAL_CAPSULE | Freq: Every day | ORAL | Status: DC
  Administered 2019-04-09 – 2019-04-11 (×3): 600 mg via ORAL
  Filled 2019-04-08 (×3): qty 2

## 2019-04-08 MED ORDER — BUDESONIDE 0.5 MG/2ML IN SUSP
0.5000 mg | Freq: Two times a day (BID) | RESPIRATORY_TRACT | Status: DC
  Administered 2019-04-08 – 2019-04-11 (×6): 0.5 mg via RESPIRATORY_TRACT
  Filled 2019-04-08 (×7): qty 2

## 2019-04-08 MED ORDER — METOPROLOL SUCCINATE ER 25 MG PO TB24
25.0000 mg | ORAL_TABLET | Freq: Every day | ORAL | Status: DC
  Administered 2019-04-08 – 2019-04-11 (×4): 25 mg via ORAL
  Filled 2019-04-08 (×4): qty 1

## 2019-04-08 MED ORDER — HEPARIN BOLUS VIA INFUSION
1600.0000 [IU] | Freq: Once | INTRAVENOUS | Status: AC
  Administered 2019-04-08: 1600 [IU] via INTRAVENOUS
  Filled 2019-04-08: qty 1600

## 2019-04-08 MED ORDER — OXYCODONE HCL 5 MG PO TABS: 10.0000 mg | ORAL_TABLET | Freq: Four times a day (QID) | ORAL | Status: DC | PRN

## 2019-04-08 MED ORDER — DEXTROSE 5 % IV SOLN
600.0000 mg | INTRAVENOUS | Status: DC
  Administered 2019-04-08 – 2019-04-09 (×2): 600 mg via INTRAVENOUS
  Filled 2019-04-08 (×3): qty 2.4

## 2019-04-08 MED ORDER — AZITHROMYCIN 250 MG PO TABS
250.0000 mg | ORAL_TABLET | Freq: Once | ORAL | Status: AC
  Administered 2019-04-08: 250 mg via ORAL
  Filled 2019-04-08: qty 1

## 2019-04-08 MED ORDER — GABAPENTIN 300 MG PO CAPS
900.0000 mg | ORAL_CAPSULE | Freq: Every day | ORAL | Status: DC
  Administered 2019-04-08 – 2019-04-10 (×3): 900 mg via ORAL
  Filled 2019-04-08 (×3): qty 3

## 2019-04-08 MED ORDER — ONDANSETRON 4 MG PO TBDP
4.0000 mg | ORAL_TABLET | Freq: Once | ORAL | Status: AC
  Administered 2019-04-08: 4 mg via ORAL
  Filled 2019-04-08: qty 1

## 2019-04-08 MED ORDER — ENOXAPARIN SODIUM 40 MG/0.4ML ~~LOC~~ SOLN
40.0000 mg | SUBCUTANEOUS | Status: DC
  Administered 2019-04-08 – 2019-04-10 (×3): 40 mg via SUBCUTANEOUS
  Filled 2019-04-08 (×3): qty 0.4

## 2019-04-08 MED ORDER — METHYLPREDNISOLONE SODIUM SUCC 40 MG IJ SOLR
40.0000 mg | Freq: Two times a day (BID) | INTRAMUSCULAR | Status: DC
  Administered 2019-04-08 – 2019-04-11 (×6): 40 mg via INTRAVENOUS
  Filled 2019-04-08 (×6): qty 1

## 2019-04-08 MED ORDER — ONDANSETRON 4 MG PO TBDP
4.0000 mg | ORAL_TABLET | ORAL | Status: DC
  Administered 2019-04-09 – 2019-04-11 (×3): 4 mg via ORAL
  Filled 2019-04-08 (×3): qty 1

## 2019-04-08 NOTE — Progress Notes (Signed)
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 ID consulted and recs apprec    COPD: severe. Continue on azithromycin. Continue on supplemental oxygen and wean as tolerated. Continue on bronchodilators. Pulmon consulted  Acute hypoxic respiratory failure: likely multifactorial, secondary to above including but not limited to MAC & severe COPD. Continue on supplemental oxygen and wean as tolerated.   Elevated troponins: d/c IV heparin drip as per cardio. Started on metoprolol. Continue on tele. Cardio following and recs apprec   HTN: continue on losartan & metoprolol  HLD: continue on statin  Thoracic aortic aneurysm: CTA chest done is neg for PE but shows and ascending thoracic aortic aneurysm 4.5 x 4.6 cm and also aneurysmal dilatation of descending thoracic aorta without evidence of dissection. Vascular consulted today   Normocytic anemia: no need for a transfusion at this time. Will continue to monitor   Smoker: pt refuses nicotine patch. Smoking cessation counseling    DVT prophylaxis: lovenox  Code Status: full  Family Communication:  Disposition Plan:    Consultants:   Cardio  ID  Pulmonary  Vascular surgery     Procedures:    Antimicrobials: azithromycin, rifampin, ethambutol   Subjective: Pt c/o cough  Objective: Vitals:   04/07/19 1537 04/07/19 2017 04/08/19 0427 04/08/19 0802  BP: (!) 121/92 106/85 118/77 (!) 134/99  Pulse: 98 94 97 (!) 107  Resp: _0 Temp: 98.1 F (36.7 C) 97.6 F (36.4 C) 97.9 F (36.6 C) 97.6 F  (36.4 C)  TempSrc: Oral Oral Oral   SpO2: 93% 92% 95% 98%  Weight: 54.7 kg  54.3 kg   Height: _1  (1.651 m)       Intake/Output Summary (Last 24 hours) at 04/08/2019 0814 Last data filed at 04/08/2019 0700 Gross per 24 hour  Intake 3118.17 ml  Output 0 ml  Net 3118.17 ml   Filed Weights   04/07/19 1040 04/07/19 1537 04/08/19 0427  Weight: 45.8 kg 54.7 kg 54.3 kg    Examination:  General exam: Appears calm and comfortable. Appears older than stated age  Respiratory system: course breath sounds b/l.  Cardiovascular system: S1 & S2 +. No rubs, gallops or clicks.  Gastrointestinal system: Abdomen is nondistended, soft and nontender. Normal bowel sounds heard. Central nervous system: Alert and oriented. Moves all 4 extremities  Psychiatry: Judgement and insight appear normal. Flat mood and affect     Data Reviewed: I have personally reviewed following labs and imaging studies  CBC: Recent Labs  Lab 04/07/19 1131 04/08/19 0203  WBC 9.6 8.0  HGB 13.2 10.8*  HCT 39.3 32.3*  MCV 88.1 89.5  PLT 238 449   Basic Metabolic Panel: Recent Labs  Lab 04/07/19 1131  NA 134*  K 4.0  CL 97*  CO2 24  GLUCOSE 104*  BUN 15  CREATININE 1.18  CALCIUM 8.7*   GFR: Estimated Creatinine Clearance: 49.9 mL/min (by C-G formula based on SCr of 1.18 mg/dL). Liver Function Tests: No results for input(s): AST, ALT, ALKPHOS, BILITOT, PROT, ALBUMIN in the last 168 hours. No results for  input(s): LIPASE, AMYLASE in the last 168 hours. No results for input(s): AMMONIA in the last 168 hours. Coagulation Profile: Recent Labs  Lab 04/07/19 1131  INR 1.1   Cardiac Enzymes: No results for input(s): CKTOTAL, CKMB, CKMBINDEX, TROPONINI in the last 168 hours. BNP (last 3 results) No results for input(s): PROBNP in the last 8760 hours. HbA1C: No results for input(s): HGBA1C in the last 72 hours. CBG: No results for input(s): GLUCAP in the last 168 hours. Lipid Profile: No results for  input(s): CHOL, HDL, LDLCALC, TRIG, CHOLHDL, LDLDIRECT in the last 72 hours. Thyroid Function Tests: Recent Labs    04/07/19 1326  TSH 2.292  FREET4 1.17*   Anemia Panel: No results for input(s): VITAMINB12, FOLATE, FERRITIN, TIBC, IRON, RETICCTPCT in the last 72 hours. Sepsis Labs: No results for input(s): PROCALCITON, LATICACIDVEN in the last 168 hours.  Recent Results (from the past 240 hour(s))  Respiratory Panel by RT PCR (Flu A&B, Covid) - Nasopharyngeal Swab     Status: None   Collection Time: 04/07/19 11:36 AM   Specimen: Nasopharyngeal Swab  Result Value Ref Range Status   SARS Coronavirus 2 by RT PCR NEGATIVE NEGATIVE Final    Comment: (NOTE) SARS-CoV-2 target nucleic acids are NOT DETECTED. The SARS-CoV-2 RNA is generally detectable in upper respiratoy specimens during the acute phase of infection. The lowest concentration of SARS-CoV-2 viral copies this assay can detect is 131 copies/mL. A negative result does not preclude SARS-Cov-2 infection and should not be used as the sole basis for treatment or other patient management decisions. A negative result may occur with  improper specimen collection/handling, submission of specimen other than nasopharyngeal swab, presence of viral mutation(s) within the areas targeted by this assay, and inadequate number of viral copies (<131 copies/mL). A negative result must be combined with clinical observations, patient history, and epidemiological information. The expected result is Negative. Fact Sheet for Patients:  PinkCheek.be Fact Sheet for Healthcare Providers:  GravelBags.it This test is not yet ap proved or cleared by the [place] FDA and  has been authorized for detection and/or diagnosis of SARS-CoV-2 by FDA under an Emergency Use Authorization (EUA). This EUA will remain  in effect (meaning this test can be used) for the duration of the COVID-19  declaration under Section 564(b)(1) of the Act, 21 U.S.C. section 360bbb-3(b)(1), unless the authorization is terminated or revoked sooner.    Influenza A by PCR NEGATIVE NEGATIVE Final   Influenza B by PCR NEGATIVE NEGATIVE Final    Comment: (NOTE) The Xpert Xpress SARS-CoV-2/FLU/RSV assay is intended as an aid in  the diagnosis of influenza from Nasopharyngeal swab specimens and  should not be used as a sole basis for treatment. Nasal washings and  aspirates are unacceptable for Xpert Xpress SARS-CoV-2/FLU/RSV  testing. Fact Sheet for Patients: PinkCheek.be Fact Sheet for Healthcare Providers: GravelBags.it This test is not yet approved or cleared by the [place] FDA and  has been authorized for detection and/or diagnosis of SARS-CoV-2 by  FDA under an Emergency Use Authorization (EUA). This EUA will remain  in effect (meaning this test can be used) for the duration of the  Covid-19 declaration under Section 564(b)(1) of the Act, 21  U.S.C. section 360bbb-3(b)(1), unless the authorization is  terminated or revoked. Performed at [facility], [address] [place], [place] [address]          Radiology Studies: DG Chest 2 View  Result Date: 04/07/2019 CLINICAL DATA:  Chest pain and shortness  of breath EXAM: CHEST - 2 VIEW COMPARISON:  March 13, 2019 chest radiograph and chest CT February 13, 2019 FINDINGS: There remains consolidation with volume loss in the right upper lobe with slightly improved aeration in this area compared to prior studies. Elsewhere, there is extensive underlying emphysematous change with fibrosis. Fibrosis is most severe in the right base region. No new opacity evident. Heart size and pulmonary vascularity are normal. No adenopathy is evident. There is aortic atherosclerosis. No bone lesions. There is calcification in each carotid artery. IMPRESSION: Underlying emphysematous change in  fibrosis. Consolidation in the right upper lobe is slightly less pronounced than on recent prior studies. No new opacity evident. Stable cardiac silhouette. No adenopathy demonstrable by radiography. Aortic Atherosclerosis (ICD10-I70.0). Electronically Signed   By: Lowella Grip III M.D.   On: 04/07/2019 11:20   CT Angio Chest PE W/Cm &/Or Wo Cm  Result Date: 04/07/2019 CLINICAL DATA:  Shortness of breath. EXAM: CT ANGIOGRAPHY CHEST WITH CONTRAST TECHNIQUE: Multidetector CT imaging of the chest was performed using the standard protocol during bolus administration of intravenous contrast. Multiplanar CT image reconstructions and MIPs were obtained to evaluate the vascular anatomy. CONTRAST:  47m OMNIPAQUE IOHEXOL 350 MG/ML SOLN COMPARISON:  Chest radiograph April 07, 2019; chest CT February 13, 2019 FINDINGS: Cardiovascular: There is no demonstrable pulmonary embolus. Ascending thoracic aortic diameter measures 4.6 x 4.5 cm. No dissection is appreciable. The contrast bolus in the aorta is somewhat less than optimal for dissection assessment. There is localized aneurysmal dilatation in the distal descending thoracic aorta at the diaphragmatic hiatus with localized plaque and calcification along the leftward aspect of this aneurysm. The descending thoracic aorta at the level of the diaphragmatic hiatus measures 3.8 x 3.1 cm. Appearance of this aneurysm is similar to most recent CT. There is aortic atherosclerosis as well as scattered foci of calcification in proximal visualized great vessels. There are foci of coronary artery calcification. There is left ventricular hypertrophy. No pericardial effusion or pericardial thickening. Mediastinum/Nodes: Thyroid appears unremarkable. There are mildly prominent right hilar lymph nodes, largest measuring 1.5 x 1.3 cm. Subcarinal lymph node measures 1.4 x 1.2 cm. No esophageal lesions are evident. Lungs/Pleura: There is a moderate right pleural effusion with a much  smaller left pleural effusion. There is extensive underlying emphysematous change with fibrosis in the lower lung regions, significantly more on the right than on the left. There are multiple areas of cicatrization throughout the lungs bilaterally, more severe on the right than on the left. There is consolidation in the anterior segment right upper lobe with air bronchograms and volume loss. No similar consolidation elsewhere. Upper Abdomen: There is upper abdominal aortic atherosclerosis. Visualized upper abdominal structures otherwise appear unremarkable. Musculoskeletal: Anterior wedging of a midthoracic vertebral body is stable. No blastic or lytic bone lesions. No evident chest wall lesions. Review of the MIP images confirms the above findings. IMPRESSION: 1.  No demonstrable pulmonary embolus. 2. Prominence of the ascending thoracic aorta with a measured diameter of 4.6 x 4.5 cm. Ascending thoracic aortic aneurysm. Recommend semi-annual imaging followup by CTA or MRA and referral to cardiothoracic surgery if not already obtained. This recommendation follows 2010 ACCF/AHA/AATS/ACR/ASA/SCA/SCAI/SIR/STS/SVM Guidelines for the Diagnosis and Management of Patients With Thoracic Aortic Disease. Circulation. 2010; 121:: Z610-R604 Aortic aneurysm NOS (ICD10-I71.9). No dissection evident with contrast bolus somewhat less than optimal for dissection assessment. 3. Localized aneurysmal dilatation of the descending thoracic aorta at the diaphragmatic hiatus level measuring 3.8 x 3.1 cm. Localized plaque and dilatation  along the leftward aspect of the aneurysm, also present previously. Note that there is aortic atherosclerosis as well as foci of great vessel and coronary artery calcification. 4. 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 Pleural effusions bilaterally, larger on the right than on the left. Persistent consolidation in the right upper lobe anteriorly with air  bronchograms. Appearance most consistent with pneumonia with possibility of superimposed neoplasm not excluded anteriorly toward the apex on the right. 5.  Prominent right hilar and subcarinal lymph nodes. Aortic Atherosclerosis (ICD10-I70.0) and Emphysema (ICD10-J43.9). Aortic aneurysm NOS (ICD10-I71.9). Electronically Signed   By: Lowella Grip III M.D.   On: 04/07/2019 13:17        Scheduled Meds:  albuterol  2 puff Inhalation Once   aspirin EC  81 mg Oral Daily   atorvastatin  80 mg Oral q1800   azithromycin  500 mg Oral Daily   ethambutol  800 mg Oral Daily   finasteride  5 mg Oral Daily   FLUoxetine  20 mg Oral Daily   losartan  25 mg Oral Daily   metoprolol succinate  25 mg Oral Daily   rifampin  600 mg Oral Daily   sodium chloride flush  3 mL Intravenous Q12H   tamsulosin  0.4 mg Oral QPC supper   Continuous Infusions:  sodium chloride       LOS: 1 day    Time spent: 33 mins     Wyvonnia Dusky, MD [facility] Pager [phone]  If 7PM-7AM, please contact night-coverage [email or web address] 04/08/2019, 8:14 AM

## 2019-04-08 NOTE — Progress Notes (Signed)
[facility] Cardiology  SUBJECTIVE: Patient laying in bed, reports feeling better, less shortness of breath, denies chest pain except when cough   Vitals:   04/07/19 1230 04/07/19 1537 04/07/19 2017 04/08/19 0427  BP: (!) 131/98 (!) 121/92 106/85 118/77  Pulse: (!) 102 98 94 97  Resp:  _0 Temp:  98.1 F (36.7 C) 97.6 F (36.4 C) 97.9 F (36.6 C)  TempSrc:  Oral Oral Oral  SpO2: 93% 93% 92% 95%  Weight:  54.7 kg  54.3 kg  Height:  _1  (1.651 m)       Intake/Output Summary (Last 24 hours) at 04/08/2019 0746 Last data filed at 04/08/2019 0700 Gross per 24 hour  Intake 3118.17 ml  Output 0 ml  Net 3118.17 ml      PHYSICAL EXAM  General: Well developed, well nourished, in no acute distress HEENT:  Normocephalic and atramatic Neck:  No JVD.  Lungs: Clear bilaterally to auscultation and percussion. Heart: HRRR . Normal S1 and S2 without gallops or murmurs.  Abdomen: Bowel sounds are positive, abdomen soft and non-tender  Msk:  Back normal, normal gait. Normal strength and tone for age. Extremities: No clubbing, cyanosis or edema.   Neuro: Alert and oriented X 3. Psych:  Good affect, responds appropriately   LABS: Basic Metabolic Panel: Recent Labs    04/07/19 1131  NA 134*  K 4.0  CL 97*  CO2 24  GLUCOSE 104*  BUN 15  CREATININE 1.18  CALCIUM 8.7*   Liver Function Tests: No results for input(s): AST, ALT, ALKPHOS, BILITOT, PROT, ALBUMIN in the last 72 hours. No results for input(s): LIPASE, AMYLASE in the last 72 hours. CBC: Recent Labs    04/07/19 1131 04/08/19 0203  WBC 9.6 8.0  HGB 13.2 10.8*  HCT 39.3 32.3*  MCV 88.1 89.5  PLT 238 190   Cardiac Enzymes: No results for input(s): CKTOTAL, CKMB, CKMBINDEX, TROPONINI in the last 72 hours. BNP: Invalid input(s): POCBNP D-Dimer: No results for input(s): DDIMER in the last 72 hours. Hemoglobin A1C: No results for input(s): HGBA1C in the last 72 hours. Fasting Lipid Panel: No results for input(s):  CHOL, HDL, LDLCALC, TRIG, CHOLHDL, LDLDIRECT in the last 72 hours. Thyroid Function Tests: Recent Labs    04/07/19 1326  TSH 2.292   Anemia Panel: No results for input(s): VITAMINB12, FOLATE, FERRITIN, TIBC, IRON, RETICCTPCT in the last 72 hours.  DG Chest 2 View  Result Date: 04/07/2019 CLINICAL DATA:  Chest pain and shortness of breath EXAM: CHEST - 2 VIEW COMPARISON:  March 13, 2019 chest radiograph and chest CT February 13, 2019 FINDINGS: There remains consolidation with volume loss in the right upper lobe with slightly improved aeration in this area compared to prior studies. Elsewhere, there is extensive underlying emphysematous change with fibrosis. Fibrosis is most severe in the right base region. No new opacity evident. Heart size and pulmonary vascularity are normal. No adenopathy is evident. There is aortic atherosclerosis. No bone lesions. There is calcification in each carotid artery. IMPRESSION: Underlying emphysematous change in fibrosis. Consolidation in the right upper lobe is slightly less pronounced than on recent prior studies. No new opacity evident. Stable cardiac silhouette. No adenopathy demonstrable by radiography. Aortic Atherosclerosis (ICD10-I70.0). Electronically Signed   By: Lowella Grip III M.D.   On: 04/07/2019 11:20   CT Angio Chest PE W/Cm &/Or Wo Cm  Result Date: 04/07/2019 CLINICAL DATA:  Shortness of breath. EXAM: CT ANGIOGRAPHY CHEST WITH CONTRAST TECHNIQUE: Multidetector CT  imaging of the chest was performed using the standard protocol during bolus administration of intravenous contrast. Multiplanar CT image reconstructions and MIPs were obtained to evaluate the vascular anatomy. CONTRAST:  62m OMNIPAQUE IOHEXOL 350 MG/ML SOLN COMPARISON:  Chest radiograph April 07, 2019; chest CT February 13, 2019 FINDINGS: Cardiovascular: There is no demonstrable pulmonary embolus. Ascending thoracic aortic diameter measures 4.6 x 4.5 cm. No dissection is appreciable.  The contrast bolus in the aorta is somewhat less than optimal for dissection assessment. There is localized aneurysmal dilatation in the distal descending thoracic aorta at the diaphragmatic hiatus with localized plaque and calcification along the leftward aspect of this aneurysm. The descending thoracic aorta at the level of the diaphragmatic hiatus measures 3.8 x 3.1 cm. Appearance of this aneurysm is similar to most recent CT. There is aortic atherosclerosis as well as scattered foci of calcification in proximal visualized great vessels. There are foci of coronary artery calcification. There is left ventricular hypertrophy. No pericardial effusion or pericardial thickening. Mediastinum/Nodes: Thyroid appears unremarkable. There are mildly prominent right hilar lymph nodes, largest measuring 1.5 x 1.3 cm. Subcarinal lymph node measures 1.4 x 1.2 cm. No esophageal lesions are evident. Lungs/Pleura: There is a moderate right pleural effusion with a much smaller left pleural effusion. There is extensive underlying emphysematous change with fibrosis in the lower lung regions, significantly more on the right than on the left. There are multiple areas of cicatrization throughout the lungs bilaterally, more severe on the right than on the left. There is consolidation in the anterior segment right upper lobe with air bronchograms and volume loss. No similar consolidation elsewhere. Upper Abdomen: There is upper abdominal aortic atherosclerosis. Visualized upper abdominal structures otherwise appear unremarkable. Musculoskeletal: Anterior wedging of a midthoracic vertebral body is stable. No blastic or lytic bone lesions. No evident chest wall lesions. Review of the MIP images confirms the above findings. IMPRESSION: 1.  No demonstrable pulmonary embolus. 2. Prominence of the ascending thoracic aorta with a measured diameter of 4.6 x 4.5 cm. Ascending thoracic aortic aneurysm. Recommend semi-annual imaging followup by CTA  or MRA and referral to cardiothoracic surgery if not already obtained. This recommendation follows 2010 ACCF/AHA/AATS/ACR/ASA/SCA/SCAI/SIR/STS/SVM Guidelines for the Diagnosis and Management of Patients With Thoracic Aortic Disease. Circulation. 2010; 121:: B017-P102 Aortic aneurysm NOS (ICD10-I71.9). No dissection evident with contrast bolus somewhat less than optimal for dissection assessment. 3. Localized aneurysmal dilatation of the descending thoracic aorta at the diaphragmatic hiatus level measuring 3.8 x 3.1 cm. Localized plaque and dilatation along the leftward aspect of the aneurysm, also present previously. Note that there is aortic atherosclerosis as well as foci of great vessel and coronary artery calcification. 4. 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 Pleural effusions bilaterally, larger on the right than on the left. Persistent consolidation in the right upper lobe anteriorly with air bronchograms. Appearance most consistent with pneumonia with possibility of superimposed neoplasm not excluded anteriorly toward the apex on the right. 5.  Prominent right hilar and subcarinal lymph nodes. Aortic Atherosclerosis (ICD10-I70.0) and Emphysema (ICD10-J43.9). Aortic aneurysm NOS (ICD10-I71.9). Electronically Signed   By: WLowella GripIII M.D.   On: 04/07/2019 13:17     Echo pending   TELEMETRY: sinus tachycardia:  ASSESSMENT AND PLAN:  Principal Problem:   NSTEMI (non-ST elevated myocardial infarction) ([facility] Active Problems:   Benign hypertension   AAA (abdominal aortic aneurysm) (HCC)   COPD exacerbation (HCC)   Essential hypertension   Gastroesophageal reflux disease  without esophagitis    1.  Borderline elevated high-sensitivity troponin (169, 129, 119, 87 ), trending down, in the absence of angina, with abnormal ECG, in the setting of tachypnea, COPD and MAC 2.  MAC, on azithromycin and rifampin, with negative right upper lobe  biopsy malignancy 3.  COPD, with ongoing tobacco abuse 4.  Abdominal aortic aneurysm, 5 cm in diameter  Recommendations  1.  Agree with overall current therapy 2.  DC heparin 3.  Review 2D echocardiogram 4.  Defer cardiac catheterization in the absence of anginal chest pain, with borderline elevated troponin trending down, and patient with right upper lobe cavitary lesion with diagnosis of MAC 5.  Add low-dose metoprolol succinate 25 mg daily   Isaias Cowman, MD, PhD, [facility] 04/08/2019 7:46 AM

## 2019-04-08 NOTE — Progress Notes (Signed)
Pharmacy Antibiotic Note  Joseph Peters is a 64 y.o. male admitted on 04/07/2019 with pulmonary MAC.  Pharmacy has been consulted for amikacin dosing.  Today, 04/08/2019 - SCr is slightly above baseline (usually <1) - also on rifampin, ethambutol and azithromycin but having issues with N/V.    Plan:  Start Amikacin 671m IV q24h for estimated peak =45 and trough <2 (goal is peak 35-45 and trough <5)  Follow renal function  Get levels if remains inpatient for 2-3 doses   Height: _0  (165.1 cm) Weight: 119 lb 12.8 oz (54.3 kg) IBW/kg (Calculated) : 61.5  Temp (24hrs), Avg:97.7 F (36.5 C), Min:97.6 F (36.4 C), Max:97.9 F (36.6 C)  Recent Labs  Lab 04/07/19 1131 04/08/19 0203  WBC 9.6 8.0  CREATININE 1.18  --     Estimated Creatinine Clearance: 49.9 mL/min (by C-G formula based on SCr of 1.18 mg/dL).    Allergies  Allergen Reactions  . Penicillins Swelling, Rash and Other (See Comments)    Swelling in throat  Did it involve swelling of the face/tongue/throat, SOB, or low BP? yes Did it involve sudden or severe rash/hives, skin peeling, or any reaction on the inside of your mouth or nose? no Did you need to seek medical attention at a hospital or doctor's office? Yes When did it last happen?years  If all above answers are "NO", may proceed with cephalosporin use.    . Lidocaine Other (See Comments)    Unsure  At dentist's office     Thank you for allowing pharmacy to be a part of this patient's care.  DDoreene Eland PharmD, BCPS.   Work Cell: [phone]/11/2019 4:56 PM

## 2019-04-08 NOTE — Procedures (Signed)
Pre procedural Dx: Symptomatic pleural effusion Post procedural Dx: Same  Successful US guided right sided thoracentesis yielding 450 cc of serous pleural fluid.   Samples sent to lab for analysis.  EBL: None Complications: None immediate.  Ronny Bacon, MD Pager #: [phone]

## 2019-04-08 NOTE — Progress Notes (Signed)
PT Cancellation Note  Patient Details Name: Joseph Peters MRN: [id number] DOB: 09-08-1955   Cancelled Treatment:    Reason Eval/Treat Not Completed: Patient at procedure or test/unavailable; Per nursing pt receiving thoracentesis and expected to be out of the room for around 2 hours.  Will attempt to see pt at a future date/time as medically appropriate.    Linus Salmons PT, DPT 04/08/19, 4:30 PM

## 2019-04-08 NOTE — Consult Note (Signed)
NAME: Joseph Peters  DOB: 04/22/1955  MRN: [id number]  Date/Time: 04/08/2019 6:54 PM  REQUESTING PROVIDER: Dr.williams Subjective:  REASON FOR CONSULT: MAC lung ? Joseph Peters is a 64 y.o. male with a history of COPD, cavitary MAC infection rt lung was admitted with increasing SOB Pt is known to me . He has multiple comorbidities. He was started on Triple MAC therapy with azithromycin, rifampin and ethambutol on 03/24/19. On 04/02/19 his meds were stopped as he called my office  having severe vomiting, some diarrhea. He did not want to go to the ED. So zofran ODT was prescribed and he was asked to take it and then come and see me on 04/27/2019. Yesterday he had passed out at home due to SOB and when he came to my office , he was in the waiting room feeling very SOB with pulse ox of 82% and was sent to the ED.Pt says he started to feel a little better in regard to cough and sob after started MAC treatment. Once he stopped it because of severe GI symptoms the sob got worse. He has no fever No hemoptysis Recent Bronch had r/o malignancy New PCP saw her on 04/01/19  He is afebrile No chills or night sweats Diarrhea and vomiting has resolved since stopping MAC treatment as OP SOB worse, says he has no oxygen at home and will need it   Past Medical History:  Diagnosis Date  . AAA (abdominal aortic aneurysm) without rupture ([place])   . Abdominal aneurysm ([place]) 2015   being followed but not large enough to surgically treat  . Alcoholism ([place])   . Anxiety   . Arthritis   . Asthma   . Benign hypertension 07/16/2014  . Chest pain on exertion 07/16/2014  . Chronic pain syndrome   . Coccydynia 02/08/2012  . COPD (chronic obstructive pulmonary disease) ([place])    patient smokes  . Coronary artery disease   . 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 Depression   . 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 Dyspnea   . Frequent falls   . GERD (gastroesophageal reflux  disease)   . Hyperlipidemia   . IBS (irritable bowel syndrome)   . Myocardial infarction ([place]) 2010  . Neuropathy   . Pneumonia   . Prostatitis   . Restless leg syndrome   . Sciatica   . Spinal stenosis of lumbar region   . Stroke ([place]) 2015   no residual symptoms  . Tremor, essential     Past Surgical History:  Procedure Laterality Date  . CARDIAC CATHETERIZATION  2012  . COLONOSCOPY WITH PROPOFOL N/A 11/14/2016   Procedure: COLONOSCOPY WITH PROPOFOL;  Surgeon: Manya Silvas, MD;  Location: [facility] ENDOSCOPY;  Service: Endoscopy;  Laterality: N/A;  . ESOPHAGOGASTRODUODENOSCOPY (EGD) WITH PROPOFOL N/A 11/14/2016   Procedure: ESOPHAGOGASTRODUODENOSCOPY (EGD) WITH PROPOFOL;  Surgeon: Manya Silvas, MD;  Location: [facility] ENDOSCOPY;  Service: Endoscopy;  Laterality: N/A;  . JOINT REPLACEMENT Right    hip  . KNEE SURGERY     right  . SHOULDER ARTHROSCOPY WITH OPEN ROTATOR CUFF REPAIR Right 08/20/2017   Procedure: SHOULDER ARTHROSCOPY WITH OPEN ROTATOR CUFF REPAIR;  Surgeon: Corky Mull, MD;  Location: [facility] ORS;  Service: Orthopedics;  Laterality: Right;  . SHOULDER ARTHROSCOPY WITH SUBACROMIAL DECOMPRESSION, ROTATOR CUFF REPAIR AND BICEP TENDON REPAIR Left 05/10/2016   Procedure: SHOULDER ARTHROSCOPY WITH DEBTRIDEMENT, DECOMPRESSION, REPAIR OF MASSIVE ROTATOR CUFF TEAR AND BICEP TENODESIS;  Surgeon: Corky Mull, MD;  Location: [facility] ORS;  Service: Orthopedics;  Laterality: Left;  Massive cuff tear  . TOTAL HIP ARTHROPLASTY Right 07/01/2018   Procedure: TOTAL HIP ARTHROPLASTY - RIGHT;  Surgeon: Corky Mull, MD;  Location: [facility] ORS;  Service: Orthopedics;  Laterality: Right;  Marland Kitchen VIDEO BRONCHOSCOPY WITH ENDOBRONCHIAL NAVIGATION N/A 02/13/2019   Procedure: VIDEO BRONCHOSCOPY WITH ENDOBRONCHIAL NAVIGATION;  Surgeon: Ottie Glazier, MD;  Location: [facility] ORS;  Service: Thoracic;  Laterality: N/A;  . VIDEO BRONCHOSCOPY WITH ENDOBRONCHIAL NAVIGATION N/A 03/13/2019   Procedure: VIDEO BRONCHOSCOPY  WITH ENDOBRONCHIAL NAVIGATION;  Surgeon: Ottie Glazier, MD;  Location: [facility] ORS;  Service: Thoracic;  Laterality: N/A;  . VIDEO BRONCHOSCOPY WITH ENDOBRONCHIAL ULTRASOUND N/A 02/13/2019   Procedure: VIDEO BRONCHOSCOPY WITH ENDOBRONCHIAL ULTRASOUND;  Surgeon: Ottie Glazier, MD;  Location: [facility] ORS;  Service: Thoracic;  Laterality: N/A;  . VIDEO BRONCHOSCOPY WITH ENDOBRONCHIAL ULTRASOUND N/A 03/13/2019   Procedure: VIDEO BRONCHOSCOPY WITH ENDOBRONCHIAL ULTRASOUND;  Surgeon: Ottie Glazier, MD;  Location: [facility] ORS;  Service: Thoracic;  Laterality: N/A;    Social History   Socioeconomic History  . Marital status: Married    Spouse name: Not on file  . Number of children: Not on file  . Years of education: Not on file  . Highest education level: Not on file  Occupational History  . Not on file  Tobacco Use  . Smoking status: Light Tobacco Smoker    Packs/day: 0.50    Years: 30.00    Pack years: 15.00    Types: Cigarettes    Start date: 02/26/1973    Last attempt to quit: 09/21/2018    Years since quitting: 0.5  . Smokeless tobacco: Former Systems developer    Quit date: 02/26/1998  . Tobacco comment: smokes 2-3 cig a week 02/12/19  Substance and Sexual Activity  . Alcohol use: Yes    Alcohol/week: 20.0 standard drinks    Types: 20 Cans of beer per week    Comment: week   . Drug use: Yes    Comment: prescribed hydrocodone  . Sexual activity: Not on file  Other Topics Concern  . Not on file  Social History Narrative   Patient is a Nature conservation officer.;  He lives with his wife at home.  Longstanding history of smoking; quit recently.  Denies alcohol abuse.   Social Determinants of Health   Financial Resource Strain:   . Difficulty of Paying Living Expenses: Not on file  Food Insecurity:   . Worried About Charity fundraiser in the Last Year: Not on file  . Ran Out of Food in the Last Year: Not on file  Transportation Needs:   . Lack of Transportation (Medical): Not on file  . Lack of  Transportation (Non-Medical): Not on file  Physical Activity:   . Days of Exercise per Week: Not on file  . Minutes of Exercise per Session: Not on file  Stress:   . Feeling of Stress : Not on file  Social Connections:   . Frequency of Communication with Friends and Family: Not on file  . Frequency of Social Gatherings with Friends and Family: Not on file  . Attends Religious Services: Not on file  . Active Member of Clubs or Organizations: Not on file  . Attends Archivist Meetings: Not on file  . Marital Status: Not on file  Intimate Partner Violence:   . Fear of Current or Ex-Partner: Not on file  . Emotionally Abused: Not on file  . Physically  Abused: Not on file  . Sexually Abused: Not on file    Family History  Problem Relation Age of Onset  . Cancer Mother   . COPD Father 108       black lung   Allergies  Allergen Reactions  . Penicillins Swelling, Rash and Other (See Comments)    Swelling in throat  Did it involve swelling of the face/tongue/throat, SOB, or low BP? yes Did it involve sudden or severe rash/hives, skin peeling, or any reaction on the inside of your mouth or nose? no Did you need to seek medical attention at a hospital or doctor's office? Yes When did it last happen?years  If all above answers are "NO", may proceed with cephalosporin use.    . Lidocaine Other (See Comments)    Unsure  At dentist's office   ? Current Facility-Administered Medications  Medication Dose Route Frequency Provider Last Rate Last Admin  . 0.9 %  sodium chloride infusion  250 mL Intravenous PRN Para Skeans, MD      . acetaminophen (TYLENOL) tablet 650 mg  650 mg Oral Q4H PRN Para Skeans, MD      . albuterol (VENTOLIN HFA) 108 (90 Base) MCG/ACT inhaler 2 puff  2 puff Inhalation Once Blake Divine, MD      . amikacin (AMIKIN) 600 mg in dextrose 5 % 100 mL IVPB  600 mg Intravenous Q24H Berton Mount, RPH      . aspirin EC tablet 81 mg  81 mg Oral Daily  Para Skeans, MD   81 mg at 04/08/19 9518  . atorvastatin (LIPITOR) tablet 80 mg  80 mg Oral q1800 Para Skeans, MD   80 mg at 04/08/19 1758  . azithromycin (ZITHROMAX) tablet 500 mg  500 mg Oral Daily Florina Ou V, MD      . benzonatate (TESSALON) capsule 200 mg  200 mg Oral TID PRN Para Skeans, MD   200 mg at 04/08/19 0720  . budesonide (PULMICORT) nebulizer solution 0.5 mg  0.5 mg Nebulization BID Ottie Glazier, MD      . chloroprocaine (PF) (NESACAINE-MPF) 2 % injection 10 mL  10 mL Other Once Sandi Mariscal, MD      . enoxaparin (LOVENOX) injection 40 mg  40 mg Subcutaneous Q24H Eppie Gibson M, MD      . ethambutol (MYAMBUTOL) tablet 800 mg  800 mg Oral Daily Florina Ou V, MD      . finasteride (PROSCAR) tablet 5 mg  5 mg Oral Daily Para Skeans, MD   5 mg at 04/08/19 0929  . FLUoxetine (PROZAC) capsule 20 mg  20 mg Oral Daily Para Skeans, MD   20 mg at 04/08/19 0929  . gabapentin (NEURONTIN) capsule 900 mg  900 mg Oral QHS Wyvonnia Dusky, MD      . guaiFENesin-dextromethorphan [facility] DM) 100-10 MG/5ML syrup 10 mL  10 mL Oral Q4H PRN Para Skeans, MD   10 mL at 04/07/19 2354  . ipratropium-albuterol (DUONEB) 0.5-2.5 (3) MG/3ML nebulizer solution 3 mL  3 mL Nebulization Q6H Aleskerov, Fuad, MD      . losartan (COZAAR) tablet 25 mg  25 mg Oral Daily Para Skeans, MD   25 mg at 04/08/19 0929  . methylPREDNISolone sodium succinate (SOLU-MEDROL) 40 mg/mL injection 40 mg  40 mg Intravenous Q12H Ottie Glazier, MD   40 mg at 04/08/19 1759  . metoprolol succinate (TOPROL-XL) 24 hr tablet 25 mg  25  mg Oral Daily Paraschos, Alexander, MD   25 mg at 04/08/19 0929  . nitroGLYCERIN (NITROSTAT) SL tablet 0.4 mg  0.4 mg Sublingual Q5 Min x 3 PRN Para Skeans, MD      . ondansetron [facility]) injection 4 mg  4 mg Intravenous Q6H PRN Para Skeans, MD      . Derrill Memo ON 04/09/2019] ondansetron (ZOFRAN-ODT) disintegrating tablet 4 mg  4 mg Oral Q24H Tayler Lassen, MD      .  oxyCODONE (Oxy IR/ROXICODONE) immediate release tablet 10 mg  10 mg Oral QID PRN Oswald Hillock, RPH      . [START ON 04/09/2019] rifampin (RIFADIN) capsule 600 mg  600 mg Oral Daily Tyreque Finken, MD      . sodium chloride flush (NS) 0.9 % injection 3 mL  3 mL Intravenous Q12H Para Skeans, MD   3 mL at 04/08/19 0929  . sodium chloride flush (NS) 0.9 % injection 3 mL  3 mL Intravenous PRN Para Skeans, MD      . tamsulosin [facility]) capsule 0.4 mg  0.4 mg Oral QPC supper Para Skeans, MD   0.4 mg at 04/08/19 1758     Abtx:  Anti-infectives (From admission, onward)   Start     Dose/Rate Route Frequency Ordered Stop   04/09/19 1000  rifampin (RIFADIN) capsule 600 mg     600 mg Oral Daily 04/08/19 1837     04/08/19 2000  amikacin (AMIKIN) 600 mg in dextrose 5 % 100 mL IVPB     600 mg 102.4 mL/hr over 60 Minutes Intravenous Every 24 hours 04/08/19 1657     04/08/19 1800  rifampin (RIFADIN) capsule 600 mg  Status:  Discontinued     600 mg Oral Daily 04/08/19 1613 04/08/19 1834   04/08/19 1800  azithromycin (ZITHROMAX) tablet 250 mg     250 mg Oral  Once 04/08/19 1613 04/08/19 1835   04/07/19 1430  rifampin (RIFADIN) capsule 600 mg  Status:  Discontinued     600 mg Oral Daily 04/07/19 1415 04/08/19 1613   04/07/19 1430  azithromycin (ZITHROMAX) tablet 500 mg     500 mg Oral Daily 04/07/19 1415     04/07/19 1430  ethambutol (MYAMBUTOL) tablet 800 mg     800 mg Oral Daily 04/07/19 1415        REVIEW OF SYSTEMS:  Const: negative fever, negative chills,  weight loss Eyes: negative diplopia or visual changes, negative eye pain ENT: negative coryza, negative sore throat Resp: negative cough, hemoptysis, dyspnea Cards: negative for chest pain, palpitations, lower extremity edema GU: negative for frequency, dysuria and hematuria GI: as above Skin: negative for rash and pruritus Heme: negative for easy bruising and gum/nose bleeding MS: generalized weakness Neurolo:negative for  headaches, has dizziness, syncope  memory problems  Psych:  Anxiety Endocrine: negative for thyroid, diabetes Allergy/Immunology- as noted above Objective:  VITALS:  BP 103/74 (BP Location: Right Arm)   Pulse 88   Temp 98.1 F (36.7 C) (Oral)   Resp 18   Ht _0  (1.651 m)   Wt 54.3 kg   SpO2 97%   BMI 19.94 kg/m  PHYSICAL EXAM:  General: Alert, cooperative, some resp distress, appears stated age. emaciated Head: Normocephalic, without obvious abnormality, atraumatic. Eyes: Conjunctivae clear, anicteric sclerae. Pupils are equal ENT Nares normal. No drainage or sinus tenderness. Lips, mucosa, and tongue normal. No Thrush Neck: Supple, symmetrical, no adenopathy, thyroid: non tender no carotid bruit and  no JVD. Back: No CVA tenderness. Lungs: b/l rhonchi Heart: Regular rate and rhythm, no murmur, rub or gallop. Abdomen: Soft, non-tender,not distended. Bowel sounds normal. No masses Extremities: atraumatic, no cyanosis. No edema. No clubbing Skin: No rashes or lesions. Or bruising Lymph: Cervical, supraclavicular normal. Neurologic: Grossly non-focal Pertinent Labs Lab Results CBC    Component Value Date/Time   WBC 8.0 04/08/2019 0203   RBC 3.61 (L) 04/08/2019 0203   HGB 10.8 (L) 04/08/2019 0203   HGB 15.6 06/23/2012 1028   HCT 32.3 (L) 04/08/2019 0203   HCT 44.3 06/23/2012 1028   PLT 190 04/08/2019 0203   PLT 174 06/23/2012 1028   MCV 89.5 04/08/2019 0203   MCV 93 06/23/2012 1028   MCH 29.9 04/08/2019 0203   MCHC 33.4 04/08/2019 0203   RDW 13.8 04/08/2019 0203   RDW 13.1 06/23/2012 1028   LYMPHSABS 1.0 11/14/2018 1122   MONOABS 1.3 (H) 11/14/2018 1122   EOSABS 0.0 11/14/2018 1122   BASOSABS 0.0 11/14/2018 1122    CMP Latest Ref Rng & Units 04/07/2019 03/13/2019 12/24/2018  Glucose 70 - 99 mg/dL 104(H) 72 96  BUN 8 - 23 mg/dL _0 Creatinine 0.61 - 1.24 mg/dL 1.18 0.87 0.90  Sodium 135 - 145 mmol/L 134(L) 134(L) 132(L)  Potassium 3.5 - 5.1 mmol/L 4.0 3.7  3.7  Chloride 98 - 111 mmol/L 97(L) 100 97(L)  CO2 22 - 32 mmol/L _1 Calcium 8.9 - 10.3 mg/dL 8.7(L) 8.6(L) 8.8(L)  Total Protein 6.5 - 8.1 g/dL - 7.5 -  Total Bilirubin 0.3 - 1.2 mg/dL - 1.0 -  Alkaline Phos 38 - 126 U/L - 96 -  AST 15 - 41 U/L - 17 -  ALT 0 - 44 U/L - 8 -      Microbiology: Recent Results (from the past 240 hour(s))  Respiratory Panel by RT PCR (Flu A&B, Covid) - Nasopharyngeal Swab     Status: None   Collection Time: 04/07/19 11:36 AM   Specimen: Nasopharyngeal Swab  Result Value Ref Range Status   SARS Coronavirus 2 by RT PCR NEGATIVE NEGATIVE Final    Comment: (NOTE) SARS-CoV-2 target nucleic acids are NOT DETECTED. The SARS-CoV-2 RNA is generally detectable in upper respiratoy specimens during the acute phase of infection. The lowest concentration of SARS-CoV-2 viral copies this assay can detect is 131 copies/mL. A negative result does not preclude SARS-Cov-2 infection and should not be used as the sole basis for treatment or other patient management decisions. A negative result may occur with  improper specimen collection/handling, submission of specimen other than nasopharyngeal swab, presence of viral mutation(s) within the areas targeted by this assay, and inadequate number of viral copies (<131 copies/mL). A negative result must be combined with clinical observations, patient history, and epidemiological information. The expected result is Negative. Fact Sheet for Patients:  PinkCheek.be Fact Sheet for Healthcare Providers:  GravelBags.it This test is not yet ap proved or cleared by the [place] FDA and  has been authorized for detection and/or diagnosis of SARS-CoV-2 by FDA under an Emergency Use Authorization (EUA). This EUA will remain  in effect (meaning this test can be used) for the duration of the COVID-19 declaration under Section 564(b)(1) of the Act, 21 U.S.C. section  360bbb-3(b)(1), unless the authorization is terminated or revoked sooner.    Influenza A by PCR NEGATIVE NEGATIVE Final   Influenza B by PCR NEGATIVE NEGATIVE Final    Comment: (NOTE) The Xpert Xpress SARS-CoV-2/FLU/RSV  assay is intended as an aid in  the diagnosis of influenza from Nasopharyngeal swab specimens and  should not be used as a sole basis for treatment. Nasal washings and  aspirates are unacceptable for Xpert Xpress SARS-CoV-2/FLU/RSV  testing. Fact Sheet for Patients: PinkCheek.be Fact Sheet for Healthcare Providers: GravelBags.it This test is not yet approved or cleared by the [place] FDA and  has been authorized for detection and/or diagnosis of SARS-CoV-2 by  FDA under an Emergency Use Authorization (EUA). This EUA will remain  in effect (meaning this test can be used) for the duration of the  Covid-19 declaration under Section 564(b)(1) of the Act, 21  U.S.C. section 360bbb-3(b)(1), unless the authorization is  terminated or revoked. Performed at [facility], [address] [place], [place] [address]     IMAGING RESULTS:   Extensive emphysematous change in fibrosis, more severe on the right than on the left. Pleural effusions bilaterally, larger on the right than on the left. Persistent consolidation in the right upper lobe anteriorly with air bronchograms. Appearance most consistent with pneumonia with possibility of superimposed neoplasm not excluded anteriorly toward the apex on the right. I have personally reviewed the films ? Impression/Recommendation ? ?Acute hypoxic resp failure with underlying COPD/cavitary MAC  Rt pleural effusion would have tipped him over the edge. 400c c was taken out  Elevated troponin- seen by cardiologist-    Cavitary MAC rt lobe with underlying COPD Malignancy ruled out recently by bronch/biopsy. Pt started on triple regimen Azithromcyin 500,  Rifampin 616m and Ethambutol 8047mon 03/24/19 with discussion on introducing Amikacin ( as per current guidelines needed for cavitary MAC) Because of severe nausea and vomiting and diarrhea- meds stopped after a week- will add zofran before the meds and restart- may also space it out over a 3 hour period or may be take ethambutol in the evening . 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 ___________________________________________________ Discussed with patient, requesting provider

## 2019-04-08 NOTE — Consult Note (Signed)
ANTICOAGULATION CONSULT NOTE - Initial Consult  Pharmacy Consult for Heparin Drip Indication: chest pain/ACS/STEMI  Allergies  Allergen Reactions  . Penicillins Swelling, Rash and Other (See Comments)    Swelling in throat  Did it involve swelling of the face/tongue/throat, SOB, or low BP? yes Did it involve sudden or severe rash/hives, skin peeling, or any reaction on the inside of your mouth or nose? no Did you need to seek medical attention at a hospital or doctor's office? Yes When did it last happen?years  If all above answers are "NO", may proceed with cephalosporin use.    . Lidocaine Other (See Comments)    Unsure  At dentist's office    Patient Measurements: Height: 5\' 5"  (165.1 cm) Weight: 120 lb 9.6 oz (54.7 kg) IBW/kg (Calculated) : 61.5 Heparin Dosing Weight: 45.8kg  Vital Signs: Temp: 97.6 F (36.4 C) (02/09 2017) Temp Source: Oral (02/09 2017) BP: 106/85 (02/09 2017) Pulse Rate: 94 (02/09 2017)  Labs: Recent Labs    04/07/19 1131 04/07/19 1131 04/07/19 1326 04/07/19 1534 04/07/19 1828 04/08/19 0203  HGB 13.2  --   --   --   --   --   HCT 39.3  --   --   --   --   --   PLT 238  --   --   --   --   --   APTT 32  --   --   --   --   --   LABPROT 13.9  --   --   --   --   --   INR 1.1  --   --   --   --   --   HEPARINUNFRC  --   --   --   --  0.10* 0.12*  CREATININE 1.18  --   --   --   --   --   TROPONINIHS 169*   < > 129* 119* 87*  --    < > = values in this interval not displayed.    Estimated Creatinine Clearance: 50.2 mL/min (by C-G formula based on SCr of 1.18 mg/dL).   Medical History: Past Medical History:  Diagnosis Date  . AAA (abdominal aortic aneurysm) without rupture ([place])   . Abdominal aneurysm ([place]) 2015   being followed but not large enough to surgically treat  . Alcoholism ([place])   . Anxiety   . Arthritis   . Asthma   . Benign hypertension 07/16/2014  . Chest pain on exertion 07/16/2014  . Chronic pain syndrome   .  Coccydynia 02/08/2012  . COPD (chronic obstructive pulmonary disease) ([place])    patient smokes  . Coronary artery disease   . 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 Depression   . 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 Dyspnea   . Frequent falls   . GERD (gastroesophageal reflux disease)   . Hyperlipidemia   . IBS (irritable bowel syndrome)   . Myocardial infarction ([place]) 2010  . Neuropathy   . Pneumonia   . Prostatitis   . Restless leg syndrome   . Sciatica   . Spinal stenosis of lumbar region   . Stroke ([place]) 2015   no residual symptoms  . Tremor, essential     Medications:  No PTA anticoagulant of record  Assessment:  64 y.o. male with past medical history of CAD, COPD, hypertension, AAA, pulmonary fibrosis presents to the ED complaining worsening of shortness  of breath and elevated troponins.  Pharmacy was consulted to initiate and monitor a heparin drip.  Pt was never given 3000 units bolus, and heparin infusion was started at 550 units/hr  2/9 @ 1840  HL  < 0.10  Goal of Therapy:  Heparin level 0.3-0.7 units/ml Monitor platelets by anticoagulation protocol: Yes   Plan:  02/10 @ 0300 HL 0.12 subtherapeutic. Will rebolus w/ heparin 1600 units IV x 1 and increase rate to 900 units/hr and will recheck HL @ 0900, will check CBC w/ am labs and continue to monitor.  Tobie Lords, PharmD, BCPS Clinical Pharmacist 04/08/2019 3:05 AM

## 2019-04-08 NOTE — Progress Notes (Signed)
MD notified that patient refused his azithromycin and ethambutol this morning per orders from his Infectious disease doctor.

## 2019-04-08 NOTE — Consult Note (Signed)
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 He was seen by me on consult service and had bronchoscopy with biopsies which showed granulomas with AFB + inflammatory infiltrate consistent with MAC but negative for malignancy. Patient did have thoracic and abdominal aortic aneurysm with enlargement as well as multiple comorbid conditions including chronic pain syndrome, CAD, IBS, MI, restless leg syndrome, CVA, AAA, anxiety, depression, tobacco abuse, alcoholism, and hypertension.Pulmonary consultation placed by hospitalist service for further evaluation and management of complex pulmonary process.  Patient had taken triple antibiotic therapy for Mycobacterium avium complex and had reported some diarrhea with nausea to infectious disease who had asked him to stop therapy.  Patient has additional follow-up with ID.  He reports that his cough had improved however he developed worsening shortness of breath which is why he came into the hospital.    PAST MEDICAL HISTORY   Past Medical History:  Diagnosis Date  . AAA (abdominal aortic aneurysm) without rupture ([place])   . Abdominal aneurysm ([place]) 2015   being followed but not large enough to surgically treat  . Alcoholism ([place])   . Anxiety   . Arthritis   . Asthma   . Benign  hypertension 07/16/2014  . Chest pain on exertion 07/16/2014  . Chronic pain syndrome   . Coccydynia 02/08/2012  . COPD (chronic obstructive pulmonary disease) ([place])    patient smokes  . Coronary artery disease   . 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 Depression   . 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 Dyspnea   . Frequent falls   . GERD (gastroesophageal reflux disease)   . Hyperlipidemia   . IBS (irritable bowel syndrome)   . Myocardial infarction ([place]) 2010  . Neuropathy   . Pneumonia   . Prostatitis   . Restless leg syndrome   . Sciatica   . Spinal stenosis of lumbar region   . Stroke ([place]) 2015   no residual symptoms  . Tremor, essential      SURGICAL HISTORY   Past Surgical History:  Procedure Laterality Date  . CARDIAC CATHETERIZATION  2012  . COLONOSCOPY WITH PROPOFOL N/A 11/14/2016   Procedure: COLONOSCOPY WITH PROPOFOL;  Surgeon: Manya Silvas, MD;  Location[facility] ENDOSCOPY;  Service: Endoscopy;  Laterality: N/A;  . ESOPHAGOGASTRODUODENOSCOPY (EGD) WITH PROPOFOL N/A 11/14/2016   Procedure: ESOPHAGOGASTRODUODENOSCOPY (EGD) WITH PROPOFOL;  Surgeon: Manya Silvas, MD;  Location: [facility] ENDOSCOPY;  Service: Endoscopy;  Laterality: N/A;  . JOINT REPLACEMENT Right    hip  . KNEE SURGERY     right  . SHOULDER ARTHROSCOPY WITH OPEN ROTATOR CUFF REPAIR Right 08/20/2017   Procedure: SHOULDER ARTHROSCOPY WITH OPEN ROTATOR CUFF REPAIR;  Surgeon: Corky Mull, MD;  Location: [facility] ORS;  Service: Orthopedics;  Laterality: Right;  . SHOULDER ARTHROSCOPY WITH SUBACROMIAL DECOMPRESSION, ROTATOR CUFF REPAIR AND BICEP TENDON REPAIR Left 05/10/2016   Procedure: SHOULDER ARTHROSCOPY WITH DEBTRIDEMENT, DECOMPRESSION, REPAIR OF MASSIVE ROTATOR CUFF TEAR AND BICEP TENODESIS;  Surgeon: Corky Mull, MD;  Location: [facility] ORS;  Service: Orthopedics;  Laterality: Left;  Massive cuff tear  . TOTAL HIP ARTHROPLASTY Right 07/01/2018   Procedure: TOTAL HIP  ARTHROPLASTY - RIGHT;  Surgeon: Corky Mull, MD;  Location: [facility] ORS;  Service: Orthopedics;  Laterality: Right;  Marland Kitchen VIDEO BRONCHOSCOPY WITH ENDOBRONCHIAL NAVIGATION N/A 02/13/2019   Procedure: VIDEO BRONCHOSCOPY WITH ENDOBRONCHIAL NAVIGATION;  Surgeon: Ottie Glazier, MD;  Location: [facility] ORS;  Service: Thoracic;  Laterality: N/A;  . VIDEO BRONCHOSCOPY WITH ENDOBRONCHIAL NAVIGATION N/A 03/13/2019   Procedure: VIDEO BRONCHOSCOPY WITH ENDOBRONCHIAL NAVIGATION;  Surgeon: Ottie Glazier, MD;  Location: [facility] ORS;  Service: Thoracic;  Laterality: N/A;  . VIDEO BRONCHOSCOPY WITH ENDOBRONCHIAL ULTRASOUND N/A 02/13/2019   Procedure: VIDEO BRONCHOSCOPY WITH ENDOBRONCHIAL ULTRASOUND;  Surgeon: Ottie Glazier, MD;  Location: [facility] ORS;  Service: Thoracic;  Laterality: N/A;  . VIDEO BRONCHOSCOPY WITH ENDOBRONCHIAL ULTRASOUND N/A 03/13/2019   Procedure: VIDEO BRONCHOSCOPY WITH ENDOBRONCHIAL ULTRASOUND;  Surgeon: Ottie Glazier, MD;  Location: [facility] ORS;  Service: Thoracic;  Laterality: N/A;     FAMILY HISTORY   Family History  Problem Relation Age of Onset  . Cancer Mother   . COPD Father 29       black lung     SOCIAL HISTORY   Social History   Tobacco Use  . Smoking status: Light Tobacco Smoker    Packs/day: 0.50    Years: 30.00    Pack years: 15.00    Types: Cigarettes    Start date: 02/26/1973    Last attempt to quit: 09/21/2018    Years since quitting: 0.5  . Smokeless tobacco: Former Systems developer    Quit date: 02/26/1998  . Tobacco comment: smokes 2-3 cig a week 02/12/19  Substance Use Topics  . Alcohol use: Yes    Alcohol/week: 20.0 standard drinks    Types: 20 Cans of beer per week    Comment: week   . Drug use: Yes    Comment: prescribed hydrocodone     MEDICATIONS    Home Medication:    Current Medication:  Current Facility-Administered Medications:  .  0.9 %  sodium chloride infusion, 250 mL, Intravenous, PRN, Florina Ou V, MD .  acetaminophen (TYLENOL) tablet 650 mg, 650  mg, Oral, Q4H PRN, Posey Pronto, Ekta V, MD .  albuterol (VENTOLIN HFA) 108 (90 Base) MCG/ACT inhaler 2 puff, 2 puff, Inhalation, Once, Blake Divine, MD .  aspirin EC tablet 81 mg, 81 mg, Oral, Daily, Florina Ou V, MD, 81 mg at 04/08/19 0928 .  atorvastatin (LIPITOR) tablet 80 mg, 80 mg, Oral, q1800, Para Skeans, MD, 80 mg at 04/07/19 1655 .  azithromycin (ZITHROMAX) tablet 500 mg, 500 mg, Oral, Daily, Posey Pronto, Ekta V, MD .  benzonatate (TESSALON) capsule 200 mg, 200 mg, Oral, TID PRN, Para Skeans, MD, 200 mg at 04/08/19 0720 .  enoxaparin (LOVENOX) injection 40 mg, 40 mg, Subcutaneous, Q24H, Eppie Gibson M, MD .  ethambutol (MYAMBUTOL) tablet 800 mg, 800 mg, Oral, Daily, Florina Ou V, MD .  finasteride (PROSCAR) tablet 5 mg, 5 mg, Oral, Daily, Para Skeans, MD, 5 mg at 04/08/19 0929 .  FLUoxetine (PROZAC) capsule 20 mg, 20 mg, Oral, Daily, Florina Ou V, MD, 20 mg at 04/08/19 0929 .  guaiFENesin-dextromethorphan (ROBITUSSIN DM)  100-10 MG/5ML syrup 10 mL, 10 mL, Oral, Q4H PRN, Para Skeans, MD, 10 mL at 04/07/19 2354 .  losartan (COZAAR) tablet 25 mg, 25 mg, Oral, Daily, Florina Ou V, MD, 25 mg at 04/08/19 0929 .  metoprolol succinate (TOPROL-XL) 24 hr tablet 25 mg, 25 mg, Oral, Daily, Paraschos, Alexander, MD, 25 mg at 04/08/19 0929 .  nitroGLYCERIN (NITROSTAT) SL tablet 0.4 mg, 0.4 mg, Sublingual, Q5 Min x 3 PRN, Florina Ou V, MD .  ondansetron (ZOFRAN) injection 4 mg, 4 mg, Intravenous, Q6H PRN, Florina Ou V, MD .  oxyCODONE (Oxy IR/ROXICODONE) immediate release tablet 10 mg, 10 mg, Oral, QID PRN, Oswald Hillock, RPH .  rifampin (RIFADIN) capsule 600 mg, 600 mg, Oral, Daily, Florina Ou V, MD, 600 mg at 04/08/19 0929 .  sodium chloride flush (NS) 0.9 % injection 3 mL, 3 mL, Intravenous, Q12H, Para Skeans, MD, 3 mL at 04/08/19 0929 .  sodium chloride flush (NS) 0.9 % injection 3 mL, 3 mL, Intravenous, PRN, Para Skeans, MD .  tamsulosin (FLOMAX) capsule 0.4 mg, 0.4 mg, Oral, QPC  supper, Florina Ou V, MD, 0.4 mg at 04/07/19 1655    ALLERGIES   Penicillins and Lidocaine     REVIEW OF SYSTEMS    Review of Systems:  Gen:  Denies  fever, sweats, chills weigh loss  HEENT: Denies blurred vision, double vision, ear pain, eye pain, hearing loss, nose bleeds, sore throat Cardiac:  No dizziness, chest pain or heaviness, chest tightness,edema Resp:   Denies cough or sputum porduction, shortness of breath,wheezing, hemoptysis,  Gi: Denies swallowing difficulty, stomach pain, nausea or vomiting, diarrhea, constipation, bowel incontinence Gu:  Denies bladder incontinence, burning urine Ext:   Denies Joint pain, stiffness or swelling Skin: Denies  skin rash, easy bruising or bleeding or hives Endoc:  Denies polyuria, polydipsia , polyphagia or weight change Psych:   Denies depression, insomnia or hallucinations   Other:  All other systems negative   VS: BP 105/82 (BP Location: Right Arm)   Pulse 100   Temp 97.7 F (36.5 C)   Resp 18   Ht _0  (1.651 m)   Wt 54.3 kg   SpO2 94%   BMI 19.94 kg/m      PHYSICAL EXAM    GENERAL:NAD, no fevers, chills, no weakness no fatigue HEAD: Normocephalic, atraumatic.  EYES: Pupils equal, round, reactive to light. Extraocular muscles intact. No scleral icterus.  MOUTH: Moist mucosal membrane. Dentition intact. No abscess noted.  EAR, NOSE, THROAT: Clear without exudates. No external lesions.  NECK: Supple. No thyromegaly. No nodules. No JVD.  PULMONARY: Bilateral rhonchorous breath sounds worse on the right and crackles at the right. CARDIOVASCULAR: S1 and S2. Regular rate and rhythm. No murmurs, rubs, or gallops. No edema. Pedal pulses 2+ bilaterally.  GASTROINTESTINAL: Soft, nontender, nondistended. No masses. Positive bowel sounds. No hepatosplenomegaly.  MUSCULOSKELETAL: No swelling, clubbing, or edema. Range of motion full in all extremities.  NEUROLOGIC: Cranial nerves II through XII are intact. No gross focal  neurological deficits. Sensation intact. Reflexes intact.  SKIN: No ulceration, lesions, rashes, or cyanosis. Skin warm and dry. Turgor intact.  PSYCHIATRIC: Mood, affect within normal limits. The patient is awake, alert and oriented x 3. Insight, judgment intact.       IMAGING    DG Chest 2 View  Result Date: 04/07/2019 CLINICAL DATA:  Chest pain and shortness of breath EXAM: CHEST - 2 VIEW COMPARISON:  March 13, 2019 chest radiograph and  chest CT February 13, 2019 FINDINGS: There remains consolidation with volume loss in the right upper lobe with slightly improved aeration in this area compared to prior studies. Elsewhere, there is extensive underlying emphysematous change with fibrosis. Fibrosis is most severe in the right base region. No new opacity evident. Heart size and pulmonary vascularity are normal. No adenopathy is evident. There is aortic atherosclerosis. No bone lesions. There is calcification in each carotid artery. IMPRESSION: Underlying emphysematous change in fibrosis. Consolidation in the right upper lobe is slightly less pronounced than on recent prior studies. No new opacity evident. Stable cardiac silhouette. No adenopathy demonstrable by radiography. Aortic Atherosclerosis (ICD10-I70.0). Electronically Signed   By: Lowella Grip III M.D.   On: 04/07/2019 11:20   CT Angio Chest PE W/Cm &/Or Wo Cm  Result Date: 04/07/2019 CLINICAL DATA:  Shortness of breath. EXAM: CT ANGIOGRAPHY CHEST WITH CONTRAST TECHNIQUE: Multidetector CT imaging of the chest was performed using the standard protocol during bolus administration of intravenous contrast. Multiplanar CT image reconstructions and MIPs were obtained to evaluate the vascular anatomy. CONTRAST:  76m OMNIPAQUE IOHEXOL 350 MG/ML SOLN COMPARISON:  Chest radiograph April 07, 2019; chest CT February 13, 2019 FINDINGS: Cardiovascular: There is no demonstrable pulmonary embolus. Ascending thoracic aortic diameter measures 4.6 x 4.5  cm. No dissection is appreciable. The contrast bolus in the aorta is somewhat less than optimal for dissection assessment. There is localized aneurysmal dilatation in the distal descending thoracic aorta at the diaphragmatic hiatus with localized plaque and calcification along the leftward aspect of this aneurysm. The descending thoracic aorta at the level of the diaphragmatic hiatus measures 3.8 x 3.1 cm. Appearance of this aneurysm is similar to most recent CT. There is aortic atherosclerosis as well as scattered foci of calcification in proximal visualized great vessels. There are foci of coronary artery calcification. There is left ventricular hypertrophy. No pericardial effusion or pericardial thickening. Mediastinum/Nodes: Thyroid appears unremarkable. There are mildly prominent right hilar lymph nodes, largest measuring 1.5 x 1.3 cm. Subcarinal lymph node measures 1.4 x 1.2 cm. No esophageal lesions are evident. Lungs/Pleura: There is a moderate right pleural effusion with a much smaller left pleural effusion. There is extensive underlying emphysematous change with fibrosis in the lower lung regions, significantly more on the right than on the left. There are multiple areas of cicatrization throughout the lungs bilaterally, more severe on the right than on the left. There is consolidation in the anterior segment right upper lobe with air bronchograms and volume loss. No similar consolidation elsewhere. Upper Abdomen: There is upper abdominal aortic atherosclerosis. Visualized upper abdominal structures otherwise appear unremarkable. Musculoskeletal: Anterior wedging of a midthoracic vertebral body is stable. No blastic or lytic bone lesions. No evident chest wall lesions. Review of the MIP images confirms the above findings. IMPRESSION: 1.  No demonstrable pulmonary embolus. 2. Prominence of the ascending thoracic aorta with a measured diameter of 4.6 x 4.5 cm. Ascending thoracic aortic aneurysm. Recommend  semi-annual imaging followup by CTA or MRA and referral to cardiothoracic surgery if not already obtained. This recommendation follows 2010 ACCF/AHA/AATS/ACR/ASA/SCA/SCAI/SIR/STS/SVM Guidelines for the Diagnosis and Management of Patients With Thoracic Aortic Disease. Circulation. 2010; 121:: O962-X528 Aortic aneurysm NOS (ICD10-I71.9). No dissection evident with contrast bolus somewhat less than optimal for dissection assessment. 3. Localized aneurysmal dilatation of the descending thoracic aorta at the diaphragmatic hiatus level measuring 3.8 x 3.1 cm. Localized plaque and dilatation along the leftward aspect of the aneurysm, also present previously. Note that there is aortic  atherosclerosis as well as foci of great vessel and coronary artery calcification. 4. 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 Pleural effusions bilaterally, larger on the right than on the left. Persistent consolidation in the right upper lobe anteriorly with air bronchograms. Appearance most consistent with pneumonia with possibility of superimposed neoplasm not excluded anteriorly toward the apex on the right. 5.  Prominent right hilar and subcarinal lymph nodes. Aortic Atherosclerosis (ICD10-I70.0) and Emphysema (ICD10-J43.9). Aortic aneurysm NOS (ICD10-I71.9). Electronically Signed   By: Lowella Grip III M.D.   On: 04/07/2019 13:17   DG Chest Port 1 View  Result Date: 03/13/2019 CLINICAL DATA:  Status post bronchoscopy EXAM: PORTABLE CHEST 1 VIEW COMPARISON:  12/23/2018 FINDINGS: The heart size and mediastinal contours are within normal limits. Redemonstrated masslike consolidation of the anterior right upper lobe. Emphysema. The visualized skeletal structures are unremarkable. IMPRESSION: 1. Redemonstrated masslike consolidation of the anterior right upper lobe. No acute appearing airspace opacity. 2.  Emphysema. Electronically Signed   By: Eddie Candle M.D.   On: 03/13/2019 16:17     DG C-Arm 1-60 Min-No Report  Result Date: 03/13/2019 Fluoroscopy was utilized by the requesting physician.  No radiographic interpretation.      ASSESSMENT/PLAN   Moderate Acute exacerbation of COPD  -Continue DuoNebs every 6 hours  -COPD care path -Budesonide 0.5 mg nebulized twice daily -Solu-Medrol 40 twice daily IV -Aggressive bronchopulmonary hygiene protocol -MetaNeb therapy twice daily with respiratory therapist -Incentive spirometer and Acapella flutter valve -Physical and Occupational Therapy    Mycobacterium Avium Complex -Patient has been started on Zithromax, rifampin, ethambutol -He is being followed by infectious disease, will defer antibiotics to Dr. Delaine Lame    Moderate right pleural effusion   -Contributing to hypoxemia -ultrasound-guided thoracentesis -Pleural fluid studies with ADA, AFB, pH, glucose, cytology, LDH, albumin, protein triglycerides,     Thank you for allowing me to participate in the care of this patient.   Patient/Family are satisfied with care plan and all questions have been answered.  This document was prepared using Dragon voice recognition software and may include unintentional dictation errors.     Ottie Glazier, M.D.  Division of [place]

## 2019-04-09 DIAGNOSIS — J441 Chronic obstructive pulmonary disease with (acute) exacerbation: Secondary | ICD-10-CM

## 2019-04-09 LAB — CBC
HCT: 35.2 % — ABNORMAL LOW (ref 39.0–52.0)
Hemoglobin: 11.5 g/dL — ABNORMAL LOW (ref 13.0–17.0)
MCH: 29.5 pg (ref 26.0–34.0)
MCHC: 32.7 g/dL (ref 30.0–36.0)
MCV: 90.3 fL (ref 80.0–100.0)
Platelets: 224 10*3/uL (ref 150–400)
RBC: 3.9 MIL/uL — ABNORMAL LOW (ref 4.22–5.81)
RDW: 13.9 % (ref 11.5–15.5)
WBC: 3.8 10*3/uL — ABNORMAL LOW (ref 4.0–10.5)
nRBC: 0 % (ref 0.0–0.2)

## 2019-04-09 LAB — COMPREHENSIVE METABOLIC PANEL
ALT: 21 U/L (ref 0–44)
AST: 38 U/L (ref 15–41)
Albumin: 2.8 g/dL — ABNORMAL LOW (ref 3.5–5.0)
Alkaline Phosphatase: 99 U/L (ref 38–126)
Anion gap: 11 (ref 5–15)
BUN: 20 mg/dL (ref 8–23)
CO2: 21 mmol/L — ABNORMAL LOW (ref 22–32)
Calcium: 8.2 mg/dL — ABNORMAL LOW (ref 8.9–10.3)
Chloride: 101 mmol/L (ref 98–111)
Creatinine, Ser: 1.16 mg/dL (ref 0.61–1.24)
GFR calc Af Amer: >60 mL/min (ref >60)
GFR calc non Af Amer: >60 mL/min (ref >60)
Glucose, Bld: 115 mg/dL — ABNORMAL HIGH (ref 70–99)
Potassium: 4.8 mmol/L (ref 3.5–5.1)
Sodium: 133 mmol/L — ABNORMAL LOW (ref 135–145)
Total Bilirubin: 0.9 mg/dL (ref 0.3–1.2)
Total Protein: 6 g/dL — ABNORMAL LOW (ref 6.5–8.1)

## 2019-04-09 MED ORDER — IPRATROPIUM-ALBUTEROL 0.5-2.5 (3) MG/3ML IN SOLN
3.0000 mL | Freq: Three times a day (TID) | RESPIRATORY_TRACT | Status: DC
  Administered 2019-04-09 – 2019-04-11 (×7): 3 mL via RESPIRATORY_TRACT
  Filled 2019-04-09 (×7): qty 3

## 2019-04-09 MED ORDER — ROSUVASTATIN CALCIUM 10 MG PO TABS
20.0000 mg | ORAL_TABLET | Freq: Every day | ORAL | Status: DC
  Administered 2019-04-09 – 2019-04-10 (×2): 20 mg via ORAL
  Filled 2019-04-09 (×2): qty 2

## 2019-04-09 MED ORDER — SODIUM CHLORIDE 3 % IN NEBU
4.0000 mL | INHALATION_SOLUTION | RESPIRATORY_TRACT | Status: DC
  Administered 2019-04-09 – 2019-04-10 (×2): 4 mL via RESPIRATORY_TRACT
  Filled 2019-04-09 (×3): qty 4

## 2019-04-09 NOTE — Evaluation (Signed)
Occupational Therapy Evaluation Patient Details Name: Joseph Peters MRN: [id number] DOB: 06-25-1955 Today's Date: 04/09/2019    History of Present Illness Per MD notes: Pt is a 64 y.o. male with past medical history of CAD, COPD, hypertension, AAA, pulmonary fibrosis presents to the ED complaining of shortness of breath.  Patient reports that he has dealt with shortness of breath for a long time but it has seemed acutely worse over the past week.  It is associated with a productive cough as well as pain in his chest after he coughs, but he denies any recent fevers.  Pt recently diagnosed with MAC and was started on antibiotics for this last week, but he states he has had difficulty tolerating these due to vomiting and diarrhea.  Pt was told to stop the medications until he could follow-up with infectious disease earlier today, however he was significantly short of breath when he arrived there and was referred to the ED for further evaluation.  Pt has been using his albuterol inhaler at home without significant relief.   Clinical Impression   Pt was seen for OT evaluation this date. Prior to hospital admission, pt was I with most BADLs. Pt lives with his wife and live in caregiver in single story home with 1-2 STE including bilateral hand rails. The caregiver is available 24/7 and assists with household IADLs. Currently pt demonstrates slight decrease in activity tolerance during functional tasks but possesses good safety awareness and ability to perform self-pacing.  Pt currently requires supervision for out of chair ADLs with RW.  Do not anticipate further OT services needed in acute setting at this time due to level of function. Based on his performance, recommending return to home with supervision for taxing ADLs.  Thank you.        Follow Up Recommendations  No OT follow up;Supervision - Intermittent(Recommending supervision for bathing and taxing IADLs)    Equipment Recommendations  None  recommended by OT    Recommendations for Other Services       Precautions / Restrictions Precautions Precautions: Fall Restrictions Weight Bearing Restrictions: No      Mobility Bed Mobility Overal bed mobility: Modified Independent                Transfers Overall transfer level: Needs assistance Equipment used: Rolling walker (2 wheeled) Transfers: Sit to/from Stand Sit to Stand: Min guard             Balance Overall balance assessment: No apparent balance deficits (not formally assessed)                                         ADL either performed or assessed with clinical judgement   ADL Overall ADL's : At baseline                                       General ADL Comments: Presents with decreased activity tolerance during tasks requring extra time     Vision Baseline Vision/History: Wears glasses Wears Glasses: Reading only Patient Visual Report: Other (comment)(Noted sometimes he sees black dots when fatigued) Vision Assessment?: Yes Ocular Range of Motion: Within Functional Limits Tracking/Visual Pursuits: Able to track stimulus in all quads without difficulty Visual Fields: No apparent deficits     Perception     Praxis  Pertinent Vitals/Pain Pain Assessment: No/denies pain     Hand Dominance Right   Extremity/Trunk Assessment Upper Extremity Assessment Upper Extremity Assessment: Overall WFL for tasks assessed   Lower Extremity Assessment Lower Extremity Assessment: Defer to PT evaluation   Cervical / Trunk Assessment Cervical / Trunk Assessment: Normal   Communication Communication Communication: No difficulties   Cognition Arousal/Alertness: Awake/alert Behavior During Therapy: WFL for tasks assessed/performed Overall Cognitive Status: Within Functional Limits for tasks assessed                                     General Comments  demonstrates low activity tolerance;  some deficit when fatigued    Exercises     Shoulder Instructions      Home Living Family/patient expects to be discharged to:: Private residence Living Arrangements: Spouse/significant other Available Help at Discharge: Family;Friend(s);Available 24 hours/day Type of Home: Mobile home Home Access: Stairs to enter Entrance Stairs-Number of Steps: 1-2 depending on entrance with bilateral hand rails Entrance Stairs-Rails: Can reach both;Left;Right Home Layout: One level     Bathroom Shower/Tub: Teacher, early years/pre: Standard Bathroom Accessibility: Yes   Home Equipment: Grab bars - toilet;Walker - 2 wheels;Cane - single point;Shower seat - built in;Grab bars - tub/shower;Walker - 4 wheels          Prior Functioning/Environment Level of Independence: Needs assistance    ADL's / Homemaking Assistance Needed: Pt stated that he is ind with bathing but stated that a friend that lives with him will help with IADLs   Comments: Per chart pt has had multiple falls in past six months including one immediately prior to his prior R THA in May. On this date he states he has had no falls in past 6 months.        OT Problem List: Decreased activity tolerance      OT Treatment/Interventions:      OT Goals(Current goals can be found in the care plan section) Acute Rehab OT Goals Patient Stated Goal: I want to be able to go home. OT Goal Formulation: All assessment and education complete, DC therapy  OT Frequency:     Barriers to D/C:            Co-evaluation              AM-PAC OT "6 Clicks" Daily Activity     Outcome Measure Help from another person eating meals?: A Little(set up) Help from another person taking care of personal grooming?: A Little Help from another person toileting, which includes using toliet, bedpan, or urinal?: A Little Help from another person bathing (including washing, rinsing, drying)?: A Little Help from another person to put on  and taking off regular upper body clothing?: A Little Help from another person to put on and taking off regular lower body clothing?: A Little 6 Click Score: 18   End of Session Equipment Utilized During Treatment: Rolling walker  Activity Tolerance: Patient limited by fatigue(Able to comprehend when he needed a seated rest break due to fatigue) Patient left: in chair;with chair alarm set;with call bell/phone within reach  OT Visit Diagnosis: History of falling (Z91.81)                Time: 0737-1062 OT Time Calculation (min): 32 min Charges:  OT Evaluation $OT Eval Low Complexity: 1 Low OT Treatments $Therapeutic Activity: 8-22 mins  Baldomero Lamy, MS,  OTR/L 04/09/19, 2:13 PM

## 2019-04-09 NOTE — Progress Notes (Signed)
Pulmonary Medicine          Date: 04/09/2019,   MRN# [id number] [facility] [birth year]     AdmissionWeight: 45.8 kg                 CurrentWeight: 54.3 kg   Referring Physician: Dr Jimmye Norman   CHIEF COMPLAINT:   Advanced Emphysema with Mycobacterium Avium Complex    HISTORY OF PRESENT ILLNESS   Patient sitting in chair reports clinical improvement.   He worked well with RT today - using MEtaNEB with hypersal he was able to recruit RUL atelectatic segment and brought up a lot of phlegm.    We discussed care plan together with Dr Delaine Lame.    PAST MEDICAL HISTORY   Past Medical History:  Diagnosis Date  . AAA (abdominal aortic aneurysm) without rupture ([place])   . Abdominal aneurysm ([place]) 2015   being followed but not large enough to surgically treat  . Alcoholism ([place])   . Anxiety   . Arthritis   . Asthma   . Benign hypertension 07/16/2014  . Chest pain on exertion 07/16/2014  . Chronic pain syndrome   . Coccydynia 02/08/2012  . COPD (chronic obstructive pulmonary disease) ([place])    patient smokes  . Coronary artery disease   . 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 Depression   . 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 Dyspnea   . Frequent falls   . GERD (gastroesophageal reflux disease)   . Hyperlipidemia   . IBS (irritable bowel syndrome)   . Myocardial infarction ([place]) 2010  . Neuropathy   . Pneumonia   . Prostatitis   . Restless leg syndrome   . Sciatica   . Spinal stenosis of lumbar region   . Stroke ([place]) 2015   no residual symptoms  . Tremor, essential      SURGICAL HISTORY   Past Surgical History:  Procedure Laterality Date  . CARDIAC CATHETERIZATION  2012  . COLONOSCOPY WITH PROPOFOL N/A 11/14/2016   Procedure: COLONOSCOPY WITH PROPOFOL;  Surgeon: Manya Silvas, MD;  Location: [facility] ENDOSCOPY;  Service: Endoscopy;  Laterality: N/A;  . ESOPHAGOGASTRODUODENOSCOPY (EGD) WITH PROPOFOL N/A  11/14/2016   Procedure: ESOPHAGOGASTRODUODENOSCOPY (EGD) WITH PROPOFOL;  Surgeon: Manya Silvas, MD;  Location: [facility] ENDOSCOPY;  Service: Endoscopy;  Laterality: N/A;  . JOINT REPLACEMENT Right    hip  . KNEE SURGERY     right  . SHOULDER ARTHROSCOPY WITH OPEN ROTATOR CUFF REPAIR Right 08/20/2017   Procedure: SHOULDER ARTHROSCOPY WITH OPEN ROTATOR CUFF REPAIR;  Surgeon: Corky Mull, MD;  Location: [facility] ORS;  Service: Orthopedics;  Laterality: Right;  . SHOULDER ARTHROSCOPY WITH SUBACROMIAL DECOMPRESSION, ROTATOR CUFF REPAIR AND BICEP TENDON REPAIR Left 05/10/2016   Procedure: SHOULDER ARTHROSCOPY WITH DEBTRIDEMENT, DECOMPRESSION, REPAIR OF MASSIVE ROTATOR CUFF TEAR AND BICEP TENODESIS;  Surgeon: Corky Mull, MD;  Location: [facility] ORS;  Service: Orthopedics;  Laterality: Left;  Massive cuff tear  . TOTAL HIP ARTHROPLASTY Right 07/01/2018   Procedure: TOTAL HIP ARTHROPLASTY - RIGHT;  Surgeon: Corky Mull, MD;  Location: [facility] ORS;  Service: Orthopedics;  Laterality: Right;  Marland Kitchen VIDEO BRONCHOSCOPY WITH ENDOBRONCHIAL NAVIGATION N/A 02/13/2019   Procedure: VIDEO BRONCHOSCOPY WITH ENDOBRONCHIAL NAVIGATION;  Surgeon: Ottie Glazier, MD;  Location: [facility] ORS;  Service: Thoracic;  Laterality: N/A;  . VIDEO BRONCHOSCOPY WITH ENDOBRONCHIAL NAVIGATION N/A 03/13/2019   Procedure: VIDEO BRONCHOSCOPY WITH ENDOBRONCHIAL NAVIGATION;  Surgeon: Ottie Glazier, MD;  Location: [facility] ORS;  Service: Thoracic;  Laterality: N/A;  . VIDEO BRONCHOSCOPY WITH ENDOBRONCHIAL ULTRASOUND N/A 02/13/2019   Procedure: VIDEO BRONCHOSCOPY WITH ENDOBRONCHIAL ULTRASOUND;  Surgeon: Ottie Glazier, MD;  Location: [facility] ORS;  Service: Thoracic;  Laterality: N/A;  . VIDEO BRONCHOSCOPY WITH ENDOBRONCHIAL ULTRASOUND N/A 03/13/2019   Procedure: VIDEO BRONCHOSCOPY WITH ENDOBRONCHIAL ULTRASOUND;  Surgeon: Ottie Glazier, MD;  Location: [facility] ORS;  Service: Thoracic;  Laterality: N/A;     FAMILY HISTORY   Family History  Problem Relation Age  of Onset  . Cancer Mother   . COPD Father 8       black lung     SOCIAL HISTORY   Social History   Tobacco Use  . Smoking status: Light Tobacco Smoker    Packs/day: 0.50    Years: 30.00    Pack years: 15.00    Types: Cigarettes    Start date: 02/26/1973    Last attempt to quit: 09/21/2018    Years since quitting: 0.5  . Smokeless tobacco: Former Systems developer    Quit date: 02/26/1998  . Tobacco comment: smokes 2-3 cig a week 02/12/19  Substance Use Topics  . Alcohol use: Yes    Alcohol/week: 20.0 standard drinks    Types: 20 Cans of beer per week    Comment: week   . Drug use: Yes    Comment: prescribed hydrocodone     MEDICATIONS    Home Medication:    Current Medication:  Current Facility-Administered Medications:  .  0.9 %  sodium chloride infusion, 250 mL, Intravenous, PRN, Para Skeans, MD, Last Rate: 10 mL/hr at 04/08/19 2008, 250 mL at 04/08/19 2008 .  acetaminophen (TYLENOL) tablet 650 mg, 650 mg, Oral, Q4H PRN, Posey Pronto, Ekta V, MD .  albuterol (VENTOLIN HFA) 108 (90 Base) MCG/ACT inhaler 2 puff, 2 puff, Inhalation, Once, Blake Divine, MD .  amikacin (AMIKIN) 600 mg in dextrose 5 % 100 mL IVPB, 600 mg, Intravenous, Q24H, Berton Mount, RPH, Last Rate: 102.4 mL/hr at 04/08/19 2010, 600 mg at 04/08/19 2010 .  aspirin EC tablet 81 mg, 81 mg, Oral, Daily, Para Skeans, MD, 81 mg at 04/08/19 0928 .  atorvastatin (LIPITOR) tablet 80 mg, 80 mg, Oral, q1800, Para Skeans, MD, 80 mg at 04/08/19 1758 .  azithromycin (ZITHROMAX) tablet 500 mg, 500 mg, Oral, Daily, Posey Pronto, Ekta V, MD .  benzonatate (TESSALON) capsule 200 mg, 200 mg, Oral, TID PRN, Para Skeans, MD, 200 mg at 04/09/19 0839 .  budesonide (PULMICORT) nebulizer solution 0.5 mg, 0.5 mg, Nebulization, BID, Lanney Gins, Camie Hauss, MD, 0.5 mg at 04/09/19 0812 .  chloroprocaine (PF) (NESACAINE-MPF) 2 % injection 10 mL, 10 mL, Other, Once, Sandi Mariscal, MD .  enoxaparin (LOVENOX) injection 40 mg, 40 mg, Subcutaneous, Q24H,  Wyvonnia Dusky, MD, 40 mg at 04/08/19 2153 .  ethambutol (MYAMBUTOL) tablet 800 mg, 800 mg, Oral, Daily, Florina Ou V, MD .  finasteride (PROSCAR) tablet 5 mg, 5 mg, Oral, Daily, Para Skeans, MD, 5 mg at 04/08/19 0929 .  FLUoxetine (PROZAC) capsule 20 mg, 20 mg, Oral, Daily, Florina Ou V, MD, 20 mg at 04/08/19 0929 .  gabapentin (NEURONTIN) capsule 900 mg, 900 mg, Oral, QHS, Wyvonnia Dusky, MD, 900 mg at 04/08/19 2153 .  guaiFENesin-dextromethorphan (ROBITUSSIN DM) 100-10 MG/5ML syrup 10 mL, 10 mL, Oral, Q4H PRN, Para Skeans, MD, 10 mL at 04/07/19 2354 .  ipratropium-albuterol (DUONEB) 0.5-2.5 (3) MG/3ML nebulizer solution 3 mL, 3 mL, Nebulization, Q6H,  Ottie Glazier, MD, 3 mL at 04/09/19 [phone] .  losartan (COZAAR) tablet 25 mg, 25 mg, Oral, Daily, Florina Ou V, MD, 25 mg at 04/08/19 0929 .  methylPREDNISolone sodium succinate (SOLU-MEDROL) 40 mg/mL injection 40 mg, 40 mg, Intravenous, Q12H, Lanney Gins, Allia Wiltsey, MD, 40 mg at 04/09/19 0533 .  metoprolol succinate (TOPROL-XL) 24 hr tablet 25 mg, 25 mg, Oral, Daily, Paraschos, Alexander, MD, 25 mg at 04/08/19 0929 .  nitroGLYCERIN (NITROSTAT) SL tablet 0.4 mg, 0.4 mg, Sublingual, Q5 Min x 3 PRN, Florina Ou V, MD .  ondansetron (ZOFRAN) injection 4 mg, 4 mg, Intravenous, Q6H PRN, Florina Ou V, MD .  ondansetron (ZOFRAN-ODT) disintegrating tablet 4 mg, 4 mg, Oral, Q24H, Ravishankar, Jayashree, MD, 4 mg at 04/09/19 0839 .  oxyCODONE (Oxy IR/ROXICODONE) immediate release tablet 10 mg, 10 mg, Oral, QID PRN, Oswald Hillock, RPH .  rifampin (RIFADIN) capsule 600 mg, 600 mg, Oral, Daily, Ravishankar, Jayashree, MD .  sodium chloride flush (NS) 0.9 % injection 3 mL, 3 mL, Intravenous, Q12H, Para Skeans, MD, 3 mL at 04/08/19 2153 .  sodium chloride flush (NS) 0.9 % injection 3 mL, 3 mL, Intravenous, PRN, Para Skeans, MD .  tamsulosin (FLOMAX) capsule 0.4 mg, 0.4 mg, Oral, QPC supper, Florina Ou V, MD, 0.4 mg at 04/08/19  1758    ALLERGIES   Penicillins and Lidocaine     REVIEW OF SYSTEMS    Review of Systems:  Gen:  Denies  fever, sweats, chills weigh loss  HEENT: Denies blurred vision, double vision, ear pain, eye pain, hearing loss, nose bleeds, sore throat Cardiac:  No dizziness, chest pain or heaviness, chest tightness,edema Resp:   Denies cough or sputum porduction, shortness of breath,wheezing, hemoptysis,  Gi: Denies swallowing difficulty, stomach pain, nausea or vomiting, diarrhea, constipation, bowel incontinence Gu:  Denies bladder incontinence, burning urine Ext:   Denies Joint pain, stiffness or swelling Skin: Denies  skin rash, easy bruising or bleeding or hives Endoc:  Denies polyuria, polydipsia , polyphagia or weight change Psych:   Denies depression, insomnia or hallucinations   Other:  All other systems negative   VS: BP 101/80 (BP Location: Right Arm)   Pulse 80   Temp (!) 97.3 F (36.3 C) (Oral)   Resp 16   Ht _0  (1.651 m)   Wt 54.3 kg   SpO2 97%   BMI 19.94 kg/m      PHYSICAL EXAM    GENERAL:NAD, no fevers, chills, no weakness no fatigue HEAD: Normocephalic, atraumatic.  EYES: Pupils equal, round, reactive to light. Extraocular muscles intact. No scleral icterus.  MOUTH: Moist mucosal membrane. Dentition intact. No abscess noted.  EAR, NOSE, THROAT: Clear without exudates. No external lesions.  NECK: Supple. No thyromegaly. No nodules. No JVD.  PULMONARY: Bilateral rhonchorous breath sounds worse on the right and crackles at the right. CARDIOVASCULAR: S1 and S2. Regular rate and rhythm. No murmurs, rubs, or gallops. No edema. Pedal pulses 2+ bilaterally.  GASTROINTESTINAL: Soft, nontender, nondistended. No masses. Positive bowel sounds. No hepatosplenomegaly.  MUSCULOSKELETAL: No swelling, clubbing, or edema. Range of motion full in all extremities.  NEUROLOGIC: Cranial nerves II through XII are intact. No gross focal neurological deficits. Sensation  intact. Reflexes intact.  SKIN: No ulceration, lesions, rashes, or cyanosis. Skin warm and dry. Turgor intact.  PSYCHIATRIC: Mood, affect within normal limits. The patient is awake, alert and oriented x 3. Insight, judgment intact.       IMAGING    DG Chest  2 View  Result Date: 04/07/2019 CLINICAL DATA:  Chest pain and shortness of breath EXAM: CHEST - 2 VIEW COMPARISON:  March 13, 2019 chest radiograph and chest CT February 13, 2019 FINDINGS: There remains consolidation with volume loss in the right upper lobe with slightly improved aeration in this area compared to prior studies. Elsewhere, there is extensive underlying emphysematous change with fibrosis. Fibrosis is most severe in the right base region. No new opacity evident. Heart size and pulmonary vascularity are normal. No adenopathy is evident. There is aortic atherosclerosis. No bone lesions. There is calcification in each carotid artery. IMPRESSION: Underlying emphysematous change in fibrosis. Consolidation in the right upper lobe is slightly less pronounced than on recent prior studies. No new opacity evident. Stable cardiac silhouette. No adenopathy demonstrable by radiography. Aortic Atherosclerosis (ICD10-I70.0). Electronically Signed   By: Lowella Grip III M.D.   On: 04/07/2019 11:20   CT Angio Chest PE W/Cm &/Or Wo Cm  Result Date: 04/07/2019 CLINICAL DATA:  Shortness of breath. EXAM: CT ANGIOGRAPHY CHEST WITH CONTRAST TECHNIQUE: Multidetector CT imaging of the chest was performed using the standard protocol during bolus administration of intravenous contrast. Multiplanar CT image reconstructions and MIPs were obtained to evaluate the vascular anatomy. CONTRAST:  62m OMNIPAQUE IOHEXOL 350 MG/ML SOLN COMPARISON:  Chest radiograph April 07, 2019; chest CT February 13, 2019 FINDINGS: Cardiovascular: There is no demonstrable pulmonary embolus. Ascending thoracic aortic diameter measures 4.6 x 4.5 cm. No dissection is appreciable.  The contrast bolus in the aorta is somewhat less than optimal for dissection assessment. There is localized aneurysmal dilatation in the distal descending thoracic aorta at the diaphragmatic hiatus with localized plaque and calcification along the leftward aspect of this aneurysm. The descending thoracic aorta at the level of the diaphragmatic hiatus measures 3.8 x 3.1 cm. Appearance of this aneurysm is similar to most recent CT. There is aortic atherosclerosis as well as scattered foci of calcification in proximal visualized great vessels. There are foci of coronary artery calcification. There is left ventricular hypertrophy. No pericardial effusion or pericardial thickening. Mediastinum/Nodes: Thyroid appears unremarkable. There are mildly prominent right hilar lymph nodes, largest measuring 1.5 x 1.3 cm. Subcarinal lymph node measures 1.4 x 1.2 cm. No esophageal lesions are evident. Lungs/Pleura: There is a moderate right pleural effusion with a much smaller left pleural effusion. There is extensive underlying emphysematous change with fibrosis in the lower lung regions, significantly more on the right than on the left. There are multiple areas of cicatrization throughout the lungs bilaterally, more severe on the right than on the left. There is consolidation in the anterior segment right upper lobe with air bronchograms and volume loss. No similar consolidation elsewhere. Upper Abdomen: There is upper abdominal aortic atherosclerosis. Visualized upper abdominal structures otherwise appear unremarkable. Musculoskeletal: Anterior wedging of a midthoracic vertebral body is stable. No blastic or lytic bone lesions. No evident chest wall lesions. Review of the MIP images confirms the above findings. IMPRESSION: 1.  No demonstrable pulmonary embolus. 2. Prominence of the ascending thoracic aorta with a measured diameter of 4.6 x 4.5 cm. Ascending thoracic aortic aneurysm. Recommend semi-annual imaging followup by CTA  or MRA and referral to cardiothoracic surgery if not already obtained. This recommendation follows 2010 ACCF/AHA/AATS/ACR/ASA/SCA/SCAI/SIR/STS/SVM Guidelines for the Diagnosis and Management of Patients With Thoracic Aortic Disease. Circulation. 2010; 121:: [id number] Aortic aneurysm NOS (ICD10-I71.9). No dissection evident with contrast bolus somewhat less than optimal for dissection assessment. 3. Localized aneurysmal dilatation of the descending thoracic aorta at  the diaphragmatic hiatus level measuring 3.8 x 3.1 cm. Localized plaque and dilatation along the leftward aspect of the aneurysm, also present previously. Note that there is aortic atherosclerosis as well as foci of great vessel and coronary artery calcification. 4. 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 Pleural effusions bilaterally, larger on the right than on the left. Persistent consolidation in the right upper lobe anteriorly with air bronchograms. Appearance most consistent with pneumonia with possibility of superimposed neoplasm not excluded anteriorly toward the apex on the right. 5.  Prominent right hilar and subcarinal lymph nodes. Aortic Atherosclerosis (ICD10-I70.0) and Emphysema (ICD10-J43.9). Aortic aneurysm NOS (ICD10-I71.9). Electronically Signed   By: Lowella Grip III M.D.   On: 04/07/2019 13:17   DG Chest Port 1 View  Result Date: 04/08/2019 CLINICAL DATA:  Post right-sided thoracentesis. EXAM: PORTABLE CHEST 1 VIEW COMPARISON:  04/07/2019; chest CT-04/07/2019 FINDINGS: Grossly unchanged cardiac silhouette and mediastinal contours with persistent obscuration of the right heart border and mediastinal stripe secondary to worsening right upper lobe heterogeneous opacities and associated volume loss. Worsening right mid and bilateral lower lung interstitial thickening. Interval reduction/resolution of right-sided pleural effusion post thoracentesis. No pneumothorax. No acute osseous  abnormalities. IMPRESSION: 1. Interval reduction/resolution of right-sided pleural effusion post thoracentesis. No pneumothorax. 2. Worsening right heterogeneous/consolidative opacities and associated volume loss. 3. Worsening right mid bilateral lower lung interstitial thickening with broad differential considerations including pulmonary edema versus progression of multifocal infection, including atypical etiologies. Electronically Signed   By: Sandi Mariscal M.D.   On: 04/08/2019 17:31   DG Chest Port 1 View  Result Date: 03/13/2019 CLINICAL DATA:  Status post bronchoscopy EXAM: PORTABLE CHEST 1 VIEW COMPARISON:  12/23/2018 FINDINGS: The heart size and mediastinal contours are within normal limits. Redemonstrated masslike consolidation of the anterior right upper lobe. Emphysema. The visualized skeletal structures are unremarkable. IMPRESSION: 1. Redemonstrated masslike consolidation of the anterior right upper lobe. No acute appearing airspace opacity. 2.  Emphysema. Electronically Signed   By: Eddie Candle M.D.   On: 03/13/2019 16:17   DG C-Arm 1-60 Min-No Report  Result Date: 03/13/2019 Fluoroscopy was utilized by the requesting physician.  No radiographic interpretation.   ECHOCARDIOGRAM COMPLETE  Result Date: 04/08/2019    ECHOCARDIOGRAM REPORT   Patient Name:   Joseph Peters Date of Exam: 04/07/2019 Medical Rec #:  [id number]        Height:       65.0 in Accession #:    [id number]       Weight:       120.6 lb Date of Birth:  Dec 13, 1955        BSA:          1.60 m Patient Age:    65 years         BP:           121/92 mmHg Patient Gender: M                HR:           96 bpm. Exam Location:  [facility] Procedure: 2D Echo, Cardiac Doppler and Color Doppler Indications:     Elevated Troponin  History:         Patient has no prior history of Echocardiogram examinations.                  Risk Factors:Dyslipidemia and Hypertension. Stroke. Pneumonia.  Myocardial Infarction. Dyspnea. Coronary  artery disease. COPD.                  Chest pain. AAA.  Sonographer:     Wilford Sports Rodgers-Jones Referring Phys:  [place] Diagnosing Phys: Isaias Cowman MD IMPRESSIONS  1. Left ventricular ejection fraction, by estimation, is 35 to 40%. The left ventricle has moderately decreased function. The left ventrical has no regional wall motion abnormalities. The left ventricular internal cavity size was mildly to moderately dilated. Left ventricular diastolic parameters are indeterminate.  2. Right ventricular systolic function is normal. The right ventricular size is normal.  3. No left atrial/left atrial appendage thrombus was detected.  4. The mitral valve is normal in structure and function. Mild mitral valve regurgitation. No evidence of mitral stenosis.  5. The aortic valve is normal in structure and function. Aortic valve regurgitation is not visualized. Mild to moderate aortic valve stenosis.  6. The inferior vena cava is normal in size with greater than 50% respiratory variability, suggesting right atrial pressure of 3 mmHg. FINDINGS  Left Ventricle: Left ventricular ejection fraction, by estimation, is 35 to 40%. The left ventricle has moderately decreased function. The left ventricle has no regional wall motion abnormalities. The left ventricular internal cavity size was mildly to moderately dilated. There is no. There is no left ventricular hypertrophy. Left ventricular diastolic parameters are indeterminate.  LV Wall Scoring: The basal inferolateral segment, mid inferoseptal segment, apical septal segment, basal inferior segment, and apex are hypokinetic. Right Ventricle: The right ventricular size is normal. No increase in right ventricular wall thickness. Right ventricular systolic function is normal. Left Atrium: Left atrial size was normal in size. Right Atrium: Right atrial size was normal in size. Pericardium: There is no evidence of pericardial effusion. Mitral Valve: The mitral  valve is normal in structure and function. Normal mobility of the mitral valve leaflets. Mild mitral valve regurgitation. No evidence of mitral valve stenosis. Tricuspid Valve: The tricuspid valve is normal in structure. Tricuspid valve regurgitation is mild . No evidence of tricuspid stenosis. Aortic Valve: The aortic valve is normal in structure and function. Aortic valve regurgitation is not visualized. Mild to moderate aortic stenosis is present. Aortic valve mean gradient measures 6.7 mmHg. Aortic valve peak gradient measures 9.2 mmHg. Aortic valve area, by VTI measures 0.96 cm. Pulmonic Valve: The pulmonic valve was normal in structure. Pulmonic valve regurgitation is not visualized. No evidence of pulmonic stenosis. Aorta: The aortic root is normal in size and structure. Venous: The inferior vena cava is normal in size with greater than 50% respiratory variability, suggesting right atrial pressure of 3 mmHg. The inferior vena cava and the hepatic vein show a normal flow pattern. IAS/Shunts: No atrial level shunt detected by color flow Doppler.  LEFT VENTRICLE PLAX 2D LVIDd:         5.61 cm  Diastology LVIDs:         4.21 cm  LV e' lateral:   4.35 cm/s LV PW:         0.94 cm  LV E/e' lateral: 13.5 LV IVS:        0.68 cm  LV e' medial:    5.00 cm/s LVOT diam:     2.20 cm  LV E/e' medial:  11.7 LV SV:         23.64 ml LV SV Index:   47.63 LVOT Area:     3.80 cm  RIGHT VENTRICLE RV Basal diam:  3.68 cm  RV S prime:     10.40 cm/s TAPSE (M-mode): 1.8 cm LEFT ATRIUM           Index       RIGHT ATRIUM           Index LA diam:      4.20 cm 2.63 cm/m  RA Area:     13.50 cm LA Vol (A2C): 40.0 ml 25.07 ml/m RA Volume:   39.70 ml  24.88 ml/m LA Vol (A4C): 17.9 ml 11.22 ml/m  AORTIC VALVE AV Area (Vmax):    0.94 cm AV Area (Vmean):   0.87 cm AV Area (VTI):     0.96 cm AV Vmax:           152.00 cm/s AV Vmean:          122.333 cm/s AV VTI:            0.246 m AV Peak Grad:      9.2 mmHg AV Mean Grad:      6.7 mmHg  LVOT Vmax:         37.45 cm/s LVOT Vmean:        27.900 cm/s LVOT VTI:          0.062 m LVOT/AV VTI ratio: 0.25  AORTA Ao Root diam: 3.60 cm MITRAL VALVE MV Area (PHT): 7.66 cm             SHUNTS MV Decel Time: 99 msec              Systemic VTI:  0.06 m MV E velocity: 58.70 cm/s 103 cm/s  Systemic Diam: 2.20 cm MV A velocity: 52.20 cm/s 70.3 cm/s MV E/A ratio:  1.12       1.5 Isaias Cowman MD Electronically signed by Isaias Cowman MD Signature Date/Time: 04/08/2019/1:00:24 PM    Final    US THORACENTESIS ASP PLEURAL SPACE W/IMG GUIDE  Addendum Date: 04/08/2019   ADDENDUM REPORT: 04/08/2019 17:38 ADDENDUM: Note, Nesacaine was utilized for subcutaneous anesthesia purposes given history of lidocaine allergy. Patient tolerated this medication without incident. Electronically Signed   By: Sandi Mariscal M.D.   On: 04/08/2019 17:38   Result Date: 04/08/2019 INDICATION: Symptomatic right-sided sided pleural effusion EXAM: US THORACENTESIS ASP PLEURAL SPACE W/IMG GUIDE COMPARISON:  Chest radiograph-04/07/2019; chest CT-04/06/2018 MEDICATIONS: None. COMPLICATIONS: None immediate. TECHNIQUE: Informed written consent was obtained from the patient after a discussion of the risks, benefits and alternatives to treatment. A timeout was performed prior to the initiation of the procedure. Initial ultrasound scanning demonstrates a small anechoic right-sided pleural effusion. The lower chest was prepped and draped in the usual sterile fashion. 1% lidocaine was used for local anesthesia. An ultrasound image was saved for documentation purposes. An 8 Fr Safe-T-Centesis catheter was introduced under ultrasound guidance. The thoracentesis was performed. The catheter was removed and a dressing was applied. The patient tolerated the procedure well without immediate post procedural complication. The patient was escorted to have an upright chest radiograph. FINDINGS: A total of approximately 450 cc of serous fluid was removed.  Requested samples were sent to the laboratory. IMPRESSION: Successful ultrasound-guided right sided thoracentesis yielding 450 cc of pleural fluid. Electronically Signed: By: Sandi Mariscal M.D. On: 04/08/2019 17:33      ASSESSMENT/PLAN   Moderate Acute exacerbation of COPD  -Continue DuoNebs every 6 hours  -COPD care path -Budesonide 0.5 mg nebulized twice daily -Solu-Medrol 40 twice daily IV -Aggressive bronchopulmonary hygiene protocol -MetaNeb therapy twice daily with respiratory therapist-adding hypersal -  Incentive spirometer and Acapella flutter valve -Physical and Occupational Therapy    Mycobacterium Avium Complex -Patient has been started on Zithromax, rifampin, ethambutol -He is being followed by infectious disease, will defer antibiotics to Dr. Delaine Lame    Moderate right pleural effusion   -Contributing to hypoxemia -ultrasound-guided thoracentesis -Pleural fluid studies with ADA, AFB, pH, glucose, cytology, LDH, albumin, protein triglycerides,     Thank you for allowing me to participate in the care of this patient.   Patient/Family are satisfied with care plan and all questions have been answered.  This document was prepared using Dragon voice recognition software and may include unintentional dictation errors.     Ottie Glazier, M.D.  Division of [place] [place]

## 2019-04-09 NOTE — Progress Notes (Signed)
Date of Admission:  04/07/2019     ID: Joseph Peters is a 64 y.o. male  Active Problems:   Benign hypertension   AAA (abdominal aortic aneurysm) (HCC)   COPD exacerbation (HCC)   Essential hypertension   Gastroesophageal reflux disease without esophagitis    Subjective: Feeling better Sitting in chair Has oxygen by nasal cannula Tolerating MAC antibiotics with zofran  Medications:  . albuterol  2 puff Inhalation Once  . aspirin EC  81 mg Oral Daily  . azithromycin  500 mg Oral Daily  . budesonide (PULMICORT) nebulizer solution  0.5 mg Nebulization BID  . chloroprocaine (PF)  10 mL Other Once  . enoxaparin (LOVENOX) injection  40 mg Subcutaneous Q24H  . ethambutol  800 mg Oral Daily  . finasteride  5 mg Oral Daily  . FLUoxetine  20 mg Oral Daily  . gabapentin  900 mg Oral QHS  . ipratropium-albuterol  3 mL Nebulization TID  . losartan  25 mg Oral Daily  . methylPREDNISolone (SOLU-MEDROL) injection  40 mg Intravenous Q12H  . metoprolol succinate  25 mg Oral Daily  . ondansetron  4 mg Oral Q24H  . rifampin  600 mg Oral Daily  . rosuvastatin  20 mg Oral q1800  . sodium chloride flush  3 mL Intravenous Q12H  . sodium chloride HYPERTONIC  4 mL Nebulization Q24H  . tamsulosin  0.4 mg Oral QPC supper    Objective: Vital signs in last 24 hours: Temp:  [97.3 F (36.3 C)-98.3 F (36.8 C)] 97.8 F (36.6 C) (02/11 1550) Pulse Rate:  [67-91] 75 (02/11 1658) Resp:  [15-18] 15 (02/11 1550) BP: (77-106)/(58-80) 95/64 (02/11 1658) SpO2:  [89 %-99 %] 95 % (02/11 1551)    Lab Results Recent Labs    04/07/19 1131 04/07/19 1131 04/08/19 0203 04/09/19 0444  WBC 9.6   < > 8.0 3.8*  HGB 13.2   < > 10.8* 11.5*  HCT 39.3   < > 32.3* 35.2*  NA 134*  --   --  133*  K 4.0  --   --  4.8  CL 97*  --   --  101  CO2 24  --   --  21*  BUN 15  --   --  20  CREATININE 1.18  --   --  1.16   < > = values in this interval not displayed.   Liver Panel Recent Labs     04/09/19 0444  PROT 6.0*  ALBUMIN 2.8*  AST 38  ALT 21  ALKPHOS 99  BILITOT 0.9   Sedimentation Rate No results for input(s): ESRSEDRATE in the last 72 hours. C-Reactive Protein No results for input(s): CRP in the last 72 hours.  Microbiology:  Studies/Results: DG Chest Port 1 View  Result Date: 04/08/2019 CLINICAL DATA:  Post right-sided thoracentesis. EXAM: PORTABLE CHEST 1 VIEW COMPARISON:  04/07/2019; chest CT-04/07/2019 FINDINGS: Grossly unchanged cardiac silhouette and mediastinal contours with persistent obscuration of the right heart border and mediastinal stripe secondary to worsening right upper lobe heterogeneous opacities and associated volume loss. Worsening right mid and bilateral lower lung interstitial thickening. Interval reduction/resolution of right-sided pleural effusion post thoracentesis. No pneumothorax. No acute osseous abnormalities. IMPRESSION: 1. Interval reduction/resolution of right-sided pleural effusion post thoracentesis. No pneumothorax. 2. Worsening right heterogeneous/consolidative opacities and associated volume loss. 3. Worsening right mid bilateral lower lung interstitial thickening with broad differential considerations including pulmonary edema versus progression of multifocal infection, including atypical etiologies. Electronically Signed  By: Sandi Mariscal M.D.   On: 04/08/2019 17:31   ECHOCARDIOGRAM COMPLETE  Result Date: 04/08/2019    ECHOCARDIOGRAM REPORT   Patient Name:   Joseph Peters Date of Exam: 04/07/2019 Medical Rec #:  [id number]        Height:       65.0 in Accession #:    [id number]       Weight:       120.6 lb Date of Birth:  07-Sep-1955        BSA:          1.60 m Patient Age:    55 years         BP:           121/92 mmHg Patient Gender: M                HR:           96 bpm. Exam Location:  [facility] Procedure: 2D Echo, Cardiac Doppler and Color Doppler Indications:     Elevated Troponin  History:         Patient has no prior history of  Echocardiogram examinations.                  Risk Factors:Dyslipidemia and Hypertension. Stroke. Pneumonia.                  Myocardial Infarction. Dyspnea. Coronary artery disease. COPD.                  Chest pain. AAA.  Sonographer:     Wilford Sports Rodgers-Jones Referring Phys:  [place] Diagnosing Phys: Isaias Cowman MD IMPRESSIONS  1. Left ventricular ejection fraction, by estimation, is 35 to 40%. The left ventricle has moderately decreased function. The left ventrical has no regional wall motion abnormalities. The left ventricular internal cavity size was mildly to moderately dilated. Left ventricular diastolic parameters are indeterminate.  2. Right ventricular systolic function is normal. The right ventricular size is normal.  3. No left atrial/left atrial appendage thrombus was detected.  4. The mitral valve is normal in structure and function. Mild mitral valve regurgitation. No evidence of mitral stenosis.  5. The aortic valve is normal in structure and function. Aortic valve regurgitation is not visualized. Mild to moderate aortic valve stenosis.  6. The inferior vena cava is normal in size with greater than 50% respiratory variability, suggesting right atrial pressure of 3 mmHg. FINDINGS  Left Ventricle: Left ventricular ejection fraction, by estimation, is 35 to 40%. The left ventricle has moderately decreased function. The left ventricle has no regional wall motion abnormalities. The left ventricular internal cavity size was mildly to moderately dilated. There is no. There is no left ventricular hypertrophy. Left ventricular diastolic parameters are indeterminate.  LV Wall Scoring: The basal inferolateral segment, mid inferoseptal segment, apical septal segment, basal inferior segment, and apex are hypokinetic. Right Ventricle: The right ventricular size is normal. No increase in right ventricular wall thickness. Right ventricular systolic function is normal. Left Atrium: Left  atrial size was normal in size. Right Atrium: Right atrial size was normal in size. Pericardium: There is no evidence of pericardial effusion. Mitral Valve: The mitral valve is normal in structure and function. Normal mobility of the mitral valve leaflets. Mild mitral valve regurgitation. No evidence of mitral valve stenosis. Tricuspid Valve: The tricuspid valve is normal in structure. Tricuspid valve regurgitation is mild . No evidence of tricuspid stenosis. Aortic Valve: The aortic valve is  normal in structure and function. Aortic valve regurgitation is not visualized. Mild to moderate aortic stenosis is present. Aortic valve mean gradient measures 6.7 mmHg. Aortic valve peak gradient measures 9.2 mmHg. Aortic valve area, by VTI measures 0.96 cm. Pulmonic Valve: The pulmonic valve was normal in structure. Pulmonic valve regurgitation is not visualized. No evidence of pulmonic stenosis. Aorta: The aortic root is normal in size and structure. Venous: The inferior vena cava is normal in size with greater than 50% respiratory variability, suggesting right atrial pressure of 3 mmHg. The inferior vena cava and the hepatic vein show a normal flow pattern. IAS/Shunts: No atrial level shunt detected by color flow Doppler.  LEFT VENTRICLE PLAX 2D LVIDd:         5.61 cm  Diastology LVIDs:         4.21 cm  LV e' lateral:   4.35 cm/s LV PW:         0.94 cm  LV E/e' lateral: 13.5 LV IVS:        0.68 cm  LV e' medial:    5.00 cm/s LVOT diam:     2.20 cm  LV E/e' medial:  11.7 LV SV:         23.64 ml LV SV Index:   47.63 LVOT Area:     3.80 cm  RIGHT VENTRICLE RV Basal diam:  3.68 cm RV S prime:     10.40 cm/s TAPSE (M-mode): 1.8 cm LEFT ATRIUM           Index       RIGHT ATRIUM           Index LA diam:      4.20 cm 2.63 cm/m  RA Area:     13.50 cm LA Vol (A2C): 40.0 ml 25.07 ml/m RA Volume:   39.70 ml  24.88 ml/m LA Vol (A4C): 17.9 ml 11.22 ml/m  AORTIC VALVE AV Area (Vmax):    0.94 cm AV Area (Vmean):   0.87 cm AV Area  (VTI):     0.96 cm AV Vmax:           152.00 cm/s AV Vmean:          122.333 cm/s AV VTI:            0.246 m AV Peak Grad:      9.2 mmHg AV Mean Grad:      6.7 mmHg LVOT Vmax:         37.45 cm/s LVOT Vmean:        27.900 cm/s LVOT VTI:          0.062 m LVOT/AV VTI ratio: 0.25  AORTA Ao Root diam: 3.60 cm MITRAL VALVE MV Area (PHT): 7.66 cm             SHUNTS MV Decel Time: 99 msec              Systemic VTI:  0.06 m MV E velocity: 58.70 cm/s 103 cm/s  Systemic Diam: 2.20 cm MV A velocity: 52.20 cm/s 70.3 cm/s MV E/A ratio:  1.12       1.5 Isaias Cowman MD Electronically signed by Isaias Cowman MD Signature Date/Time: 04/08/2019/1:00:24 PM    Final    US THORACENTESIS ASP PLEURAL SPACE W/IMG GUIDE  Addendum Date: 04/08/2019   ADDENDUM REPORT: 04/08/2019 17:38 ADDENDUM: Note, Nesacaine was utilized for subcutaneous anesthesia purposes given history of lidocaine allergy. Patient tolerated this medication without incident. Electronically Signed   By: Jenny Reichmann  Watts M.D.   On: 04/08/2019 17:38   Result Date: 04/08/2019 INDICATION: Symptomatic right-sided sided pleural effusion EXAM: US THORACENTESIS ASP PLEURAL SPACE W/IMG GUIDE COMPARISON:  Chest radiograph-04/07/2019; chest CT-04/06/2018 MEDICATIONS: None. COMPLICATIONS: None immediate. TECHNIQUE: Informed written consent was obtained from the patient after a discussion of the risks, benefits and alternatives to treatment. A timeout was performed prior to the initiation of the procedure. Initial ultrasound scanning demonstrates a small anechoic right-sided pleural effusion. The lower chest was prepped and draped in the usual sterile fashion. 1% lidocaine was used for local anesthesia. An ultrasound image was saved for documentation purposes. An 8 Fr Safe-T-Centesis catheter was introduced under ultrasound guidance. The thoracentesis was performed. The catheter was removed and a dressing was applied. The patient tolerated the procedure well without  immediate post procedural complication. The patient was escorted to have an upright chest radiograph. FINDINGS: A total of approximately 450 cc of serous fluid was removed. Requested samples were sent to the laboratory. IMPRESSION: Successful ultrasound-guided right sided thoracentesis yielding 450 cc of pleural fluid. Electronically Signed: By: Sandi Mariscal M.D. On: 04/08/2019 17:33     Assessment/Plan:   COPD exacerbation  Cavitary pulmonary MAC- as per the current guideliens he needs therapy with 4 meds- Azithromycin+ rifampin+ ethambutol and 2 months of IV amikacin. Will try to give amikacin as long as possible with careful monitoring of creatinine . The other three drugs will be given for atleast a year. He did have GI sid effects when he started on the meds. So he is getting zofran and we can even split the meds and space it over a few hours. Agrees to get PICC line for amikacin  Discussed the management with patient

## 2019-04-09 NOTE — Evaluation (Signed)
Physical Therapy Evaluation Patient Details Name: Joseph Peters MRN: [id number] DOB: Sep 03, 1955 Today's Date: 04/09/2019   History of Present Illness  Per MD notes: Pt is a 64 y.o. male with past medical history of CAD, COPD, hypertension, AAA, pulmonary fibrosis presents to the ED complaining of shortness of breath. MD assessment includes NSTEMI, HTN, AAA, COPD exacerbation, GERD, R Pleural effusion s/p theracentesis, thoracic aortic aneurysm, normocytic anemia, acute hypoxic respiratory failure: likely multifactorial, secondary to above including but not limited to MAC & severe COPD.    Clinical Impression  Pt pleasant and motivated to participate during the session. Pt found on 1LO2/min with a SPO2 of 91%. After cueing for deep breathing, pt's SPO2 reached the mid-90's. With exercises and ambulation, patient desaturated to between 88-90%. Between all of these instances, pt's O2 returned to the low-mid 90's with cueing for PLB. At the end of the session, pt's SPO2 on 1LO2.min was 97% when sitting in the chair and resting for several minutes. Nsg notified in regards to SPO2 levels during the session. When pt stood up for the first time during this session, pt reported dizziness which he states is normal for him with positional changes. His BP was 100/57 which is consistent with previous readings. Pt was able to ambulate inside his hospital room and with cueing and visual demonstration, pt was able to correctly use the RW and inc his amb speed on his second attempt. Pt will benefit from HHPT services upon discharge to safely address deficits listed in patient problem list for decreased caregiver assistance and eventual return to PLOF.       Follow Up Recommendations Home health PT    Equipment Recommendations  None recommended by PT    Recommendations for Other Services       Precautions / Restrictions Precautions Precautions: Fall Restrictions Weight Bearing Restrictions: No       Mobility  Bed Mobility Overal bed mobility: Modified Independent             General bed mobility comments: Takes inc time for bed mobility  Transfers Overall transfer level: Needs assistance Equipment used: Rolling walker (2 wheeled) Transfers: Sit to/from Stand Sit to Stand: Min guard         General transfer comment: cuieng for hand placement to help push-off from the bed  Ambulation/Gait Ambulation/Gait assistance: Min guard Gait Distance (Feet): 20 Feet x2 Assistive device: Rolling walker (2 wheeled) Gait Pattern/deviations: Step-to pattern;Step-through pattern;Decreased step length - right Gait velocity: decreased   General Gait Details: Pt initally walked with a step-to pattern with a pause after each step with his R LE. After cueing and RW training, pt was able to ambulate with a step-through pattern with no pauses.  Stairs            Wheelchair Mobility    Modified Rankin (Stroke Patients Only)       Balance Overall balance assessment: Mild deficits observed, not formally tested                                           Pertinent Vitals/Pain Pain Assessment: No/denies pain    Home Living Family/patient expects to be discharged to:: Private residence Living Arrangements: Spouse/significant other Available Help at Discharge: Family;Friend(s);Available 24 hours/day Type of Home: Mobile home Home Access: Stairs to enter Entrance Stairs-Rails: Can reach both;Left;Right Entrance Stairs-Number of Steps: 1-2 depending on  entrance with bilateral hand rails Home Layout: One level Home Equipment: Grab bars - toilet;Walker - 2 wheels;Cane - single point;Shower seat - built in;Grab bars - tub/shower;Walker - 4 wheels      Prior Function Level of Independence: Needs assistance   Gait / Transfers Assistance Needed: Community amb with no AD. Pt reports 1 fall in the past six months secondary to becoming SOB and "passing out"  ADL's /  Homemaking Assistance Needed: Pt stated that he is ind with bathing but stated that a friend that lives with him will help with IADLs  Comments: Per chart pt has had multiple falls in past six months including one immediately prior to his prior R THA in May. On this date he states he has had no falls in past 6 months.     Hand Dominance       Extremity/Trunk Assessment       Lower Extremity Assessment Lower Extremity Assessment: Generalized weakness    Cervical / Trunk Assessment Cervical / Trunk Assessment: Normal  Communication   Communication: No difficulties  Cognition Arousal/Alertness: Awake/alert Behavior During Therapy: WFL for tasks assessed/performed Overall Cognitive Status: Within Functional Limits for tasks assessed                                        General Comments General comments (skin integrity, edema, etc.): demonstrates low activity tolerance    Exercises Total Joint Exercises Ankle Circles/Pumps: AROM;Strengthening;Both;15 reps Quad Sets: AROM;Strengthening;Both;15 reps Gluteal Sets: AROM;Strengthening;Both;15 reps Long Arc Quad: AROM;Strengthening;Both;10 reps Marching in Standing: AROM;Strengthening;Both;10 reps Other Exercises Other Exercises: PLB training Other Exercises: RW sequencing training   Assessment/Plan    PT Assessment Patient needs continued PT services  PT Problem List Decreased strength;Decreased mobility;Decreased activity tolerance;Cardiopulmonary status limiting activity;Decreased balance;Decreased knowledge of use of DME       PT Treatment Interventions DME instruction;Therapeutic exercise;Gait training;Balance training;Stair training;Functional mobility training;Therapeutic activities    PT Goals (Current goals can be found in the Care Plan section)  Acute Rehab PT Goals Patient Stated Goal: get strength back PT Goal Formulation: With patient Time For Goal Achievement: 04/22/19 Potential to Achieve  Goals: Good    Frequency Min 2X/week   Barriers to discharge        Co-evaluation               AM-PAC PT "6 Clicks" Mobility  Outcome Measure Help needed turning from your back to your side while in a flat bed without using bedrails?: A Little Help needed moving from lying on your back to sitting on the side of a flat bed without using bedrails?: A Little Help needed moving to and from a bed to a chair (including a wheelchair)?: A Little Help needed standing up from a chair using your arms (e.g., wheelchair or bedside chair)?: A Little Help needed to walk in hospital room?: A Lot Help needed climbing 3-5 steps with a railing? : Total 6 Click Score: 15    End of Session Equipment Utilized During Treatment: Gait belt;Oxygen Activity Tolerance: Patient tolerated treatment well Patient left: in chair;with call bell/phone within reach;with chair alarm set Nurse Communication: Mobility status;Other (comment)(nsg notified in regards to SPO2 levels during the session) PT Visit Diagnosis: Unsteadiness on feet (R26.81);Muscle weakness (generalized) (M62.81);History of falling (Z91.81)    Time: 9480-1655 PT Time Calculation (min) (ACUTE ONLY): 45 min   Charges:  Annabelle Harman, SPT 04/09/19 3:48 PM

## 2019-04-09 NOTE — Progress Notes (Addendum)
   04/09/19 1601  MEWS Score  BP (!) 80/59  Pulse Rate 78  MEWS Score  MEWS Temp 0  MEWS Systolic 2  MEWS Pulse 0  MEWS RR 0  MEWS LOC 0  MEWS Score 2  MEWS Score Color Yellow  MEWS Assessment  Is this an acute change? Yes  MEWS guidelines implemented *See Row Information* Yellow  Provider Notification  Provider Name/Title Eppie Gibson   Date Provider Notified 04/09/19  Time Provider Notified 1605  Notification Type Page  Notification Reason Change in status  Response No new orders (Per MD continue to monitor ok if MAP is >65)  Date of Provider Response 04/09/19  Time of Provider Response 1605    MD notified, no new orders. Per MD ok if MAP is greater than 65. Will continue to monitor patient. Patient in no distress at the moment in chair talking on the phone.

## 2019-04-09 NOTE — Progress Notes (Signed)
SATURATION QUALIFICATIONS: (This note is used to comply with regulatory documentation for home oxygen)  Patient Saturations on Room Air at Rest = 97%  Patient Saturations on Room Air while Ambulating = 88%  Patient Saturations on 2 Liters of oxygen while Ambulating = 95%  Please briefly explain why patient needs home oxygen:

## 2019-04-09 NOTE — Progress Notes (Signed)
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 ID following and recs apprec    COPD: severe. Continue on azithromycin. Continue on supplemental oxygen and wean as tolerated. Continue on bronchodilators. Pulmon following and recs apprec  Right pleural effusion: s/p right pleural thoracentesis on 04/08/19 w/ fluid studies pending. Pulmon recs apprec   Acute hypoxic respiratory failure: likely multifactorial, secondary to above including but not limited to MAC & severe COPD. Continue on supplemental oxygen and wean as tolerated.   Elevated troponins: r/o NSTEMI. Continue on metoprolol. Continue on tele. Cardio signed off. F/u w/ Dr. Ubaldo Glassing in 1 week from d/c  HTN: continue on losartan & metoprolol  HLD: continue on statin  Thoracic aortic aneurysm: CTA chest is neg for PE but shows and ascending thoracic aortic aneurysm 4.5 x 4.6 cm and also aneurysmal dilatation of descending thoracic aorta without evidence of dissection. Vascular surg previously knew about aneurysm unable to repair until MAC infection is completely resolved as a high mortality rate w/ infection of graft as per vascular surg   Normocytic anemia: no need for a transfusion at this time. Will continue to monitor   Smoker: pt refuses nicotine patch. Smoking cessation counseling    DVT prophylaxis: lovenox  Code Status: full  Family Communication:  Disposition Plan: will likely need a couple more days for pt to clinically improve   Consultants:   Cardio  ID  Pulmonary  Vascular  surgery     Procedures:    Antimicrobials: azithromycin, rifampin, ethambutol, amikacin   Subjective: Pt c/o shortness of breath   Objective: Vitals:   04/08/19 1835 04/08/19 2006 04/08/19 2006 04/09/19 0337  BP:   106/66 103/78  Pulse: 88  91 79  Resp:    17  Temp:   98.3 F (36.8 C) (!) 97.4 F (36.3 C)  TempSrc:   Oral Oral  SpO2: 97% 92% 91% 92%  Weight:      Height:        Intake/Output Summary (Last 24 hours) at 04/09/2019 0753 Last data filed at 04/09/2019 5102 Gross per 24 hour  Intake 133.89 ml  Output 0 ml  Net 133.89 ml   Filed Weights   04/07/19 1040 04/07/19 1537 04/08/19 0427  Weight: 45.8 kg 54.7 kg 54.3 kg    Examination:  General exam: Appears calm and comfortable. Appears older than stated age  Respiratory system: course breath sounds b/l. No rales Cardiovascular system: S1 & S2 +. No rubs, gallops or clicks.  Gastrointestinal system: Abdomen is nondistended, soft and nontender. Normal bowel sounds heard. Central nervous system: Alert and oriented. Moves all 4 extremities  Psychiatry: Judgement and insight appear normal. Normal mood and affect     Data Reviewed: I have personally reviewed following labs and imaging studies  CBC: Recent Labs  Lab 04/07/19 1131 04/08/19 0203 04/09/19 0444  WBC 9.6 8.0 3.8*  HGB 13.2 10.8* 11.5*  HCT 39.3 32.3* 35.2*  MCV 88.1 89.5 90.3  PLT 238 190 585   Basic Metabolic Panel: Recent Labs  Lab 04/07/19 1131 04/09/19 0444  NA 134* 133*  K 4.0 4.8  CL 97* 101  CO2 24 21*  GLUCOSE 104* 115*  BUN 15 20  CREATININE 1.18 1.16  CALCIUM 8.7* 8.2*   GFR: Estimated Creatinine Clearance: 50.7 mL/min (by C-G formula based on SCr of 1.16 mg/dL). Liver Function Tests: Recent Labs  Lab 04/09/19 0444  AST 38  ALT 21  ALKPHOS 99  BILITOT 0.9  PROT 6.0*  ALBUMIN 2.8*   No results for input(s): LIPASE, AMYLASE in the last 168 hours. No results for input(s): AMMONIA in the last 168  hours. Coagulation Profile: Recent Labs  Lab 04/07/19 1131  INR 1.1   Cardiac Enzymes: No results for input(s): CKTOTAL, CKMB, CKMBINDEX, TROPONINI in the last 168 hours. BNP (last 3 results) No results for input(s): PROBNP in the last 8760 hours. HbA1C: No results for input(s): HGBA1C in the last 72 hours. CBG: No results for input(s): GLUCAP in the last 168 hours. Lipid Profile: No results for input(s): CHOL, HDL, LDLCALC, TRIG, CHOLHDL, LDLDIRECT in the last 72 hours. Thyroid Function Tests: Recent Labs    04/07/19 1326  TSH 2.292  FREET4 1.17*   Anemia Panel: No results for input(s): VITAMINB12, FOLATE, FERRITIN, TIBC, IRON, RETICCTPCT in the last 72 hours. Sepsis Labs: No results for input(s): PROCALCITON, LATICACIDVEN in the last 168 hours.  Recent Results (from the past 240 hour(s))  Respiratory Panel by RT PCR (Flu A&B, Covid) - Nasopharyngeal Swab     Status: None   Collection Time: 04/07/19 11:36 AM   Specimen: Nasopharyngeal Swab  Result Value Ref Range Status   SARS Coronavirus 2 by RT PCR NEGATIVE NEGATIVE Final    Comment: (NOTE) SARS-CoV-2 target nucleic acids are NOT DETECTED. The SARS-CoV-2 RNA is generally detectable in upper respiratoy specimens during the acute phase of infection. The lowest concentration of SARS-CoV-2 viral copies this assay can detect is 131 copies/mL. A negative result does not preclude SARS-Cov-2 infection and should not be used as the sole basis for treatment or other patient management decisions. A negative result may occur with  improper specimen collection/handling, submission of specimen other than nasopharyngeal swab, presence of viral mutation(s) within the areas targeted by this assay, and inadequate number of viral copies (<131 copies/mL). A negative result must be combined with clinical observations, patient history, and epidemiological information. The expected result is Negative. Fact Sheet for Patients:   PinkCheek.be Fact Sheet for Healthcare Providers:  GravelBags.it This test is not yet ap proved or cleared by the [place] FDA and  has been authorized for detection and/or diagnosis of SARS-CoV-2 by FDA under an Emergency Use Authorization (EUA). This EUA will remain  in effect (meaning this test can be used) for the duration of the COVID-19 declaration under Section 564(b)(1) of the Act, 21 U.S.C. section 360bbb-3(b)(1), unless the authorization is terminated or revoked sooner.    Influenza A by PCR NEGATIVE NEGATIVE Final   Influenza B by PCR NEGATIVE NEGATIVE Final    Comment: (NOTE) The Xpert Xpress SARS-CoV-2/FLU/RSV assay is intended as an aid in  the diagnosis of influenza from Nasopharyngeal swab specimens and  should not be used as a sole basis for treatment. Nasal washings and  aspirates are unacceptable for Xpert Xpress SARS-CoV-2/FLU/RSV  testing. Fact Sheet for Patients: PinkCheek.be Fact Sheet for Healthcare Providers: GravelBags.it This test is not yet approved or cleared by the [place] FDA and  has been authorized for detection and/or diagnosis of SARS-CoV-2 by  FDA under an Emergency Use  Authorization (EUA). This EUA will remain  in effect (meaning this test can be used) for the duration of the  Covid-19 declaration under Section 564(b)(1) of the Act, 21  U.S.C. section 360bbb-3(b)(1), unless the authorization is  terminated or revoked. Performed at [facility], [address] [place], [place] [address]   Body fluid culture     Status: None (Preliminary result)   Collection Time: 04/08/19  4:58 PM   Specimen: PATH Cytology Pleural fluid  Result Value Ref Range Status   Specimen Description PLEURAL  Final   Special Requests   Final    CYTO PLEU Performed at [facility], [address] [place], [place]  [address]    Gram Stain NO WBC SEEN NO ORGANISMS SEEN   Final   Culture PENDING  Incomplete   Report Status PENDING  Incomplete         Radiology Studies: DG Chest 2 View  Result Date: 04/07/2019 CLINICAL DATA:  Chest pain and shortness of breath EXAM: CHEST - 2 VIEW COMPARISON:  March 13, 2019 chest radiograph and chest CT February 13, 2019 FINDINGS: There remains consolidation with volume loss in the right upper lobe with slightly improved aeration in this area compared to prior studies. Elsewhere, there is extensive underlying emphysematous change with fibrosis. Fibrosis is most severe in the right base region. No new opacity evident. Heart size and pulmonary vascularity are normal. No adenopathy is evident. There is aortic atherosclerosis. No bone lesions. There is calcification in each carotid artery. IMPRESSION: Underlying emphysematous change in fibrosis. Consolidation in the right upper lobe is slightly less pronounced than on recent prior studies. No new opacity evident. Stable cardiac silhouette. No adenopathy demonstrable by radiography. Aortic Atherosclerosis (ICD10-I70.0). Electronically Signed   By: Lowella Grip III M.D.   On: 04/07/2019 11:20   CT Angio Chest PE W/Cm &/Or Wo Cm  Result Date: 04/07/2019 CLINICAL DATA:  Shortness of breath. EXAM: CT ANGIOGRAPHY CHEST WITH CONTRAST TECHNIQUE: Multidetector CT imaging of the chest was performed using the standard protocol during bolus administration of intravenous contrast. Multiplanar CT image reconstructions and MIPs were obtained to evaluate the vascular anatomy. CONTRAST:  87m OMNIPAQUE IOHEXOL 350 MG/ML SOLN COMPARISON:  Chest radiograph April 07, 2019; chest CT February 13, 2019 FINDINGS: Cardiovascular: There is no demonstrable pulmonary embolus. Ascending thoracic aortic diameter measures 4.6 x 4.5 cm. No dissection is appreciable. The contrast bolus in the aorta is somewhat less than optimal for dissection assessment.  There is localized aneurysmal dilatation in the distal descending thoracic aorta at the diaphragmatic hiatus with localized plaque and calcification along the leftward aspect of this aneurysm. The descending thoracic aorta at the level of the diaphragmatic hiatus measures 3.8 x 3.1 cm. Appearance of this aneurysm is similar to most recent CT. There is aortic atherosclerosis as well as scattered foci of calcification in proximal visualized great vessels. There are foci of coronary artery calcification. There is left ventricular hypertrophy. No pericardial effusion or pericardial thickening. Mediastinum/Nodes: Thyroid appears unremarkable. There are mildly prominent right hilar lymph nodes, largest measuring 1.5 x 1.3 cm. Subcarinal lymph node measures 1.4 x 1.2 cm. No esophageal lesions are evident. Lungs/Pleura: There is a moderate right pleural effusion with a much smaller left pleural effusion. There is extensive underlying emphysematous change with fibrosis in the lower lung regions, significantly more on the right than on the left. There are multiple areas of cicatrization throughout the lungs bilaterally, more severe on the right than on the  left. There is consolidation in the anterior segment right upper lobe with air bronchograms and volume loss. No similar consolidation elsewhere. Upper Abdomen: There is upper abdominal aortic atherosclerosis. Visualized upper abdominal structures otherwise appear unremarkable. Musculoskeletal: Anterior wedging of a midthoracic vertebral body is stable. No blastic or lytic bone lesions. No evident chest wall lesions. Review of the MIP images confirms the above findings. IMPRESSION: 1.  No demonstrable pulmonary embolus. 2. Prominence of the ascending thoracic aorta with a measured diameter of 4.6 x 4.5 cm. Ascending thoracic aortic aneurysm. Recommend semi-annual imaging followup by CTA or MRA and referral to cardiothoracic surgery if not already obtained. This  recommendation follows 2010 ACCF/AHA/AATS/ACR/ASA/SCA/SCAI/SIR/STS/SVM Guidelines for the Diagnosis and Management of Patients With Thoracic Aortic Disease. Circulation. 2010; 121: J194-R740. Aortic aneurysm NOS (ICD10-I71.9). No dissection evident with contrast bolus somewhat less than optimal for dissection assessment. 3. Localized aneurysmal dilatation of the descending thoracic aorta at the diaphragmatic hiatus level measuring 3.8 x 3.1 cm. Localized plaque and dilatation along the leftward aspect of the aneurysm, also present previously. Note that there is aortic atherosclerosis as well as foci of great vessel and coronary artery calcification. 4. 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 Pleural effusions bilaterally, larger on the right than on the left. Persistent consolidation in the right upper lobe anteriorly with air bronchograms. Appearance most consistent with pneumonia with possibility of superimposed neoplasm not excluded anteriorly toward the apex on the right. 5.  Prominent right hilar and subcarinal lymph nodes. Aortic Atherosclerosis (ICD10-I70.0) and Emphysema (ICD10-J43.9). Aortic aneurysm NOS (ICD10-I71.9). Electronically Signed   By: Lowella Grip III M.D.   On: 04/07/2019 13:17   DG Chest Port 1 View  Result Date: 04/08/2019 CLINICAL DATA:  Post right-sided thoracentesis. EXAM: PORTABLE CHEST 1 VIEW COMPARISON:  04/07/2019; chest CT-04/07/2019 FINDINGS: Grossly unchanged cardiac silhouette and mediastinal contours with persistent obscuration of the right heart border and mediastinal stripe secondary to worsening right upper lobe heterogeneous opacities and associated volume loss. Worsening right mid and bilateral lower lung interstitial thickening. Interval reduction/resolution of right-sided pleural effusion post thoracentesis. No pneumothorax. No acute osseous abnormalities. IMPRESSION: 1. Interval reduction/resolution of right-sided  pleural effusion post thoracentesis. No pneumothorax. 2. Worsening right heterogeneous/consolidative opacities and associated volume loss. 3. Worsening right mid bilateral lower lung interstitial thickening with broad differential considerations including pulmonary edema versus progression of multifocal infection, including atypical etiologies. Electronically Signed   By: Sandi Mariscal M.D.   On: 04/08/2019 17:31   ECHOCARDIOGRAM COMPLETE  Result Date: 04/08/2019    ECHOCARDIOGRAM REPORT   Patient Name:   Joseph Peters Date of Exam: 04/07/2019 Medical Rec #:  [id number]        Height:       65.0 in Accession #:    [id number]       Weight:       120.6 lb Date of Birth:  16-Sep-1955        BSA:          1.60 m Patient Age:    56 years         BP:           121/92 mmHg Patient Gender: M                HR:           96 bpm. Exam Location:  [facility] Procedure: 2D Echo, Cardiac Doppler and Color Doppler Indications:     Elevated Troponin  History:         Patient has no prior history of Echocardiogram examinations.                  Risk Factors:Dyslipidemia and Hypertension. Stroke. Pneumonia.                  Myocardial Infarction. Dyspnea. Coronary artery disease. COPD.                  Chest pain. AAA.  Sonographer:     Wilford Sports Rodgers-Jones Referring Phys:  [place] Diagnosing Phys: Isaias Cowman MD IMPRESSIONS  1. Left ventricular ejection fraction, by estimation, is 35 to 40%. The left ventricle has moderately decreased function. The left ventrical has no regional wall motion abnormalities. The left ventricular internal cavity size was mildly to moderately dilated. Left ventricular diastolic parameters are indeterminate.  2. Right ventricular systolic function is normal. The right ventricular size is normal.  3. No left atrial/left atrial appendage thrombus was detected.  4. The mitral valve is normal in structure and function. Mild mitral valve regurgitation. No evidence of mitral stenosis.   5. The aortic valve is normal in structure and function. Aortic valve regurgitation is not visualized. Mild to moderate aortic valve stenosis.  6. The inferior vena cava is normal in size with greater than 50% respiratory variability, suggesting right atrial pressure of 3 mmHg. FINDINGS  Left Ventricle: Left ventricular ejection fraction, by estimation, is 35 to 40%. The left ventricle has moderately decreased function. The left ventricle has no regional wall motion abnormalities. The left ventricular internal cavity size was mildly to moderately dilated. There is no. There is no left ventricular hypertrophy. Left ventricular diastolic parameters are indeterminate.  LV Wall Scoring: The basal inferolateral segment, mid inferoseptal segment, apical septal segment, basal inferior segment, and apex are hypokinetic. Right Ventricle: The right ventricular size is normal. No increase in right ventricular wall thickness. Right ventricular systolic function is normal. Left Atrium: Left atrial size was normal in size. Right Atrium: Right atrial size was normal in size. Pericardium: There is no evidence of pericardial effusion. Mitral Valve: The mitral valve is normal in structure and function. Normal mobility of the mitral valve leaflets. Mild mitral valve regurgitation. No evidence of mitral valve stenosis. Tricuspid Valve: The tricuspid valve is normal in structure. Tricuspid valve regurgitation is mild . No evidence of tricuspid stenosis. Aortic Valve: The aortic valve is normal in structure and function. Aortic valve regurgitation is not visualized. Mild to moderate aortic stenosis is present. Aortic valve mean gradient measures 6.7 mmHg. Aortic valve peak gradient measures 9.2 mmHg. Aortic valve area, by VTI measures 0.96 cm. Pulmonic Valve: The pulmonic valve was normal in structure. Pulmonic valve regurgitation is not visualized. No evidence of pulmonic stenosis. Aorta: The aortic root is normal in size and  structure. Venous: The inferior vena cava is normal in size with greater than 50% respiratory variability, suggesting right atrial pressure of 3 mmHg. The inferior vena cava and the hepatic vein show a normal flow pattern. IAS/Shunts: No atrial level shunt detected by color flow Doppler.  LEFT VENTRICLE PLAX 2D LVIDd:         5.61 cm  Diastology LVIDs:         4.21 cm  LV e' lateral:   4.35 cm/s LV PW:         0.94 cm  LV E/e' lateral: 13.5 LV IVS:        0.68 cm  LV e' medial:    5.00 cm/s LVOT diam:     2.20 cm  LV E/e' medial:  11.7 LV SV:         23.64 ml LV SV Index:   47.63 LVOT Area:     3.80 cm  RIGHT VENTRICLE RV Basal diam:  3.68 cm RV S prime:     10.40 cm/s TAPSE (M-mode): 1.8 cm LEFT ATRIUM           Index       RIGHT ATRIUM           Index LA diam:      4.20 cm 2.63 cm/m  RA Area:     13.50 cm LA Vol (A2C): 40.0 ml 25.07 ml/m RA Volume:   39.70 ml  24.88 ml/m LA Vol (A4C): 17.9 ml 11.22 ml/m  AORTIC VALVE AV Area (Vmax):    0.94 cm AV Area (Vmean):   0.87 cm AV Area (VTI):     0.96 cm AV Vmax:           152.00 cm/s AV Vmean:          122.333 cm/s AV VTI:            0.246 m AV Peak Grad:      9.2 mmHg AV Mean Grad:      6.7 mmHg LVOT Vmax:         37.45 cm/s LVOT Vmean:        27.900 cm/s LVOT VTI:          0.062 m LVOT/AV VTI ratio: 0.25  AORTA Ao Root diam: 3.60 cm MITRAL VALVE MV Area (PHT): 7.66 cm             SHUNTS MV Decel Time: 99 msec              Systemic VTI:  0.06 m MV E velocity: 58.70 cm/s 103 cm/s  Systemic Diam: 2.20 cm MV A velocity: 52.20 cm/s 70.3 cm/s MV E/A ratio:  1.12       1.5 Isaias Cowman MD Electronically signed by Isaias Cowman MD Signature Date/Time: 04/08/2019/1:00:24 PM    Final    US THORACENTESIS ASP PLEURAL SPACE W/IMG GUIDE  Addendum Date: 04/08/2019   ADDENDUM REPORT: 04/08/2019 17:38 ADDENDUM: Note, Nesacaine was utilized for subcutaneous anesthesia purposes given history of lidocaine allergy. Patient tolerated this medication without  incident. Electronically Signed   By: Sandi Mariscal M.D.   On: 04/08/2019 17:38   Result Date: 04/08/2019 INDICATION: Symptomatic right-sided sided pleural effusion EXAM: US THORACENTESIS ASP PLEURAL SPACE W/IMG GUIDE COMPARISON:  Chest radiograph-04/07/2019; chest CT-04/06/2018 MEDICATIONS: None. COMPLICATIONS: None immediate. TECHNIQUE: Informed written consent was obtained from the patient after a discussion of the risks, benefits and alternatives to treatment. A timeout was performed prior to the initiation of the procedure. Initial ultrasound scanning demonstrates a small anechoic right-sided pleural effusion. The lower chest was prepped and draped in the usual sterile fashion. 1% lidocaine was used for local anesthesia. An ultrasound image was saved for documentation purposes. An 8 Fr Safe-T-Centesis catheter was introduced under ultrasound guidance. The thoracentesis was performed. The catheter was removed and a dressing was applied. The patient tolerated the procedure well without immediate post procedural complication. The patient was escorted to have an upright chest radiograph. FINDINGS: A total of approximately 450 cc of serous fluid was removed. Requested samples were sent to the laboratory. IMPRESSION: Successful ultrasound-guided right sided thoracentesis yielding 450 cc of pleural fluid.  Electronically Signed: By: Sandi Mariscal M.D. On: 04/08/2019 17:33        Scheduled Meds: . albuterol  2 puff Inhalation Once  . aspirin EC  81 mg Oral Daily  . atorvastatin  80 mg Oral q1800  . azithromycin  500 mg Oral Daily  . budesonide (PULMICORT) nebulizer solution  0.5 mg Nebulization BID  . chloroprocaine (PF)  10 mL Other Once  . enoxaparin (LOVENOX) injection  40 mg Subcutaneous Q24H  . ethambutol  800 mg Oral Daily  . finasteride  5 mg Oral Daily  . FLUoxetine  20 mg Oral Daily  . gabapentin  900 mg Oral QHS  . ipratropium-albuterol  3 mL Nebulization Q6H  . losartan  25 mg Oral Daily  .  methylPREDNISolone (SOLU-MEDROL) injection  40 mg Intravenous Q12H  . metoprolol succinate  25 mg Oral Daily  . ondansetron  4 mg Oral Q24H  . rifampin  600 mg Oral Daily  . sodium chloride flush  3 mL Intravenous Q12H  . tamsulosin  0.4 mg Oral QPC supper   Continuous Infusions: . sodium chloride 250 mL (04/08/19 2008)  . amikacin (AMIKIN) IV 600 mg (04/08/19 2010)     LOS: 2 days    Time spent: 30 mins     Wyvonnia Dusky, MD [facility] Pager [phone]  If 7PM-7AM, please contact night-coverage [email or web address] 04/09/2019, 7:53 AM

## 2019-04-09 NOTE — Consult Note (Signed)
[facility] Vascular Consult Note  MRN : [id number]  Joseph Peters is a 64 y.o. (1955-05-07) male who presents with chief complaint of  Chief Complaint  Patient presents with  . Shortness of Breath   History of Present Illness:  The patient is a 64 year old male well-known to our service as we follow him for his ascending aortic aneurysm as well as abdominal aortic aneurysm who presented to the [facility] emergency department with a chief complaint of progressively worsening shortness of breath.  The patient was last seen in our outpatient setting on January 01, 2019 after undergoing a CT a of the abdomen which was notable for a abdominal aortic aneurysm greater than 5cm.  The patient was noted to be a candidate for endovascular repair.  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  Patient endorses a history of experiencing 5 out of 10 chest pain located to the middle of the chest radiating to the bilateral arms.  The patient was also experiencing progressively worsening shortness of breath which prompted him to seek medical attention emergency department.  Since admission, the patient has been seen by cardiology, pulmonology, underwent a thoracentesis removing 450 cc of fluid and infectious disease.  His symptoms have improved.  Denies any fever, nausea vomiting.  Denies any worsening shortness of breath than at his normal baseline or chest pain at this time.  CTA Chest on 04/07/19: 1. Prominence of the ascending thoracic aorta with a measured diameter of 4.6cm x 4.5cm. 2. Localized aneurysmal dilatation of the descending thoracic aorta at the diaphragmatic hiatus level measuring 3.8cm x 3.1cm. Localized plaque and dilatation along the leftward aspect of the aneurysm, also present previously.  CTA Chest / Abdomen / Pelvis (10/14/18): Enlarging 4.8 cm  fusiform infrarenal abdominal aortic aneurysm (previously 3.7 cm in 2017).  Vascular surgery was consulted by Dr. Jimmye Norman for further recommendations on the patient's aneurysms.  Current Facility-Administered Medications  Medication Dose Route Frequency Provider Last Rate Last Admin  . 0.9 %  sodium chloride infusion  250 mL Intravenous PRN Para Skeans, MD 10 mL/hr at 04/08/19 2008 250 mL at 04/08/19 2008  . acetaminophen (TYLENOL) tablet 650 mg  650 mg Oral Q4H PRN Para Skeans, MD      . albuterol (VENTOLIN HFA) 108 (90 Base) MCG/ACT inhaler 2 puff  2 puff Inhalation Once Blake Divine, MD      . amikacin (AMIKIN) 600 mg in dextrose 5 % 100 mL IVPB  600 mg Intravenous Q24H Berton Mount, RPH 102.4 mL/hr at 04/08/19 2010 600 mg at 04/08/19 2010  . aspirin EC tablet 81 mg  81 mg Oral Daily Para Skeans, MD   81 mg at 04/09/19 0919  . azithromycin (ZITHROMAX) tablet 500 mg  500 mg Oral Daily Para Skeans, MD   500 mg at 04/09/19 0919  . benzonatate (TESSALON) capsule 200 mg  200 mg Oral TID PRN Para Skeans, MD   200 mg at 04/09/19 0839  . budesonide (PULMICORT) nebulizer solution 0.5 mg  0.5 mg Nebulization BID Ottie Glazier, MD   0.5 mg at 04/09/19 0812  . chloroprocaine (PF) (NESACAINE-MPF) 2 % injection 10 mL  10 mL Other Once Sandi Mariscal, MD      . enoxaparin (LOVENOX) injection 40 mg  40 mg Subcutaneous Q24H Wyvonnia Dusky, MD   40 mg at 04/08/19 2153  .  ethambutol (MYAMBUTOL) tablet 800 mg  800 mg Oral Daily Para Skeans, MD   800 mg at 04/09/19 0919  . finasteride (PROSCAR) tablet 5 mg  5 mg Oral Daily Para Skeans, MD   5 mg at 04/09/19 0920  . FLUoxetine (PROZAC) capsule 20 mg  20 mg Oral Daily Para Skeans, MD   20 mg at 04/09/19 0919  . gabapentin (NEURONTIN) capsule 900 mg  900 mg Oral QHS Wyvonnia Dusky, MD   900 mg at 04/08/19 2153  . guaiFENesin-dextromethorphan (ROBITUSSIN DM) 100-10 MG/5ML syrup 10 mL  10 mL Oral Q4H PRN Para Skeans, MD   10 mL at  04/07/19 2354  . ipratropium-albuterol (DUONEB) 0.5-2.5 (3) MG/3ML nebulizer solution 3 mL  3 mL Nebulization TID Ottie Glazier, MD      . losartan (COZAAR) tablet 25 mg  25 mg Oral Daily Para Skeans, MD   25 mg at 04/09/19 0920  . methylPREDNISolone sodium succinate (SOLU-MEDROL) 40 mg/mL injection 40 mg  40 mg Intravenous Q12H Ottie Glazier, MD   40 mg at 04/09/19 0533  . metoprolol succinate (TOPROL-XL) 24 hr tablet 25 mg  25 mg Oral Daily Paraschos, Alexander, MD   25 mg at 04/09/19 0919  . nitroGLYCERIN (NITROSTAT) SL tablet 0.4 mg  0.4 mg Sublingual Q5 Min x 3 PRN Para Skeans, MD      . ondansetron (ZOFRAN) injection 4 mg  4 mg Intravenous Q6H PRN Para Skeans, MD      . ondansetron (ZOFRAN-ODT) disintegrating tablet 4 mg  4 mg Oral Q24H Tsosie Billing, MD   4 mg at 04/09/19 0839  . oxyCODONE (Oxy IR/ROXICODONE) immediate release tablet 10 mg  10 mg Oral QID PRN Oswald Hillock, RPH      . rifampin (RIFADIN) capsule 600 mg  600 mg Oral Daily Tsosie Billing, MD   600 mg at 04/09/19 0919  . rosuvastatin (CRESTOR) tablet 20 mg  20 mg Oral q1800 Wyvonnia Dusky, MD      . sodium chloride flush (NS) 0.9 % injection 3 mL  3 mL Intravenous Q12H Para Skeans, MD   3 mL at 04/09/19 0920  . sodium chloride flush (NS) 0.9 % injection 3 mL  3 mL Intravenous PRN Florina Ou V, MD      . sodium chloride HYPERTONIC 3 % nebulizer solution 4 mL  4 mL Nebulization Q24H Aleskerov, Fuad, MD      . tamsulosin (FLOMAX) capsule 0.4 mg  0.4 mg Oral QPC supper Para Skeans, MD   0.4 mg at 04/08/19 1758   Past Medical History:  Diagnosis Date  . AAA (abdominal aortic aneurysm) without rupture ([place])   . Abdominal aneurysm ([place]) 2015   being followed but not large enough to surgically treat  . Alcoholism ([place])   . Anxiety   . Arthritis   . Asthma   . Benign hypertension 07/16/2014  . Chest pain on exertion 07/16/2014  . Chronic pain syndrome   . Coccydynia 02/08/2012  . COPD  (chronic obstructive pulmonary disease) ([place])    patient smokes  . Coronary artery disease   . 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 Depression   . 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 Dyspnea   . Frequent falls   . GERD (gastroesophageal reflux disease)   . Hyperlipidemia   . IBS (irritable bowel syndrome)   . Myocardial infarction ([place]) 2010  .  Neuropathy   . Pneumonia   . Prostatitis   . Restless leg syndrome   . Sciatica   . Spinal stenosis of lumbar region   . Stroke ([place]) 2015   no residual symptoms  . Tremor, essential    Past Surgical History:  Procedure Laterality Date  . CARDIAC CATHETERIZATION  2012  . COLONOSCOPY WITH PROPOFOL N/A 11/14/2016   Procedure: COLONOSCOPY WITH PROPOFOL;  Surgeon: Manya Silvas, MD;  Location: [facility] END[facility]COPY;  Service: Endoscopy;  Laterality: N/A;  . ESOPHAGOGASTRODUODENOSCOPY (EGD) WITH PROPOFOL N/A 11/14/2016   Procedure: ESOPHAGOGASTRODUODENOSCOPY (EGD) WITH PROPOFOL;  Surgeon: Manya Silvas, MD;  Location: [facility] ENDOSCOPY;  Service: Endoscopy;  Laterality: N/A;  . JOINT REPLACEMENT Right    hip  . KNEE SURGERY     right  . SHOULDER ARTHROSCOPY WITH OPEN ROTATOR CUFF REPAIR Right 08/20/2017   Procedure: SHOULDER ARTHROSCOPY WITH OPEN ROTATOR CUFF REPAIR;  Surgeon: Corky Mull, MD;  Location: [facility] ORS;  Service: Orthopedics;  Laterality: Right;  . SHOULDER ARTHROSCOPY WITH SUBACROMIAL DECOMPRESSION, ROTATOR CUFF REPAIR AND BICEP TENDON REPAIR Left 05/10/2016   Procedure: SHOULDER ARTHROSCOPY WITH DEBTRIDEMENT, DECOMPRESSION, REPAIR OF MASSIVE ROTATOR CUFF TEAR AND BICEP TENODESIS;  Surgeon: Corky Mull, MD;  Location: [facility] ORS;  Service: Orthopedics;  Laterality: Left;  Massive cuff tear  . TOTAL HIP ARTHROPLASTY Right 07/01/2018   Procedure: TOTAL HIP ARTHROPLASTY - RIGHT;  Surgeon: Corky Mull, MD;  Location: [facility] ORS;  Service: Orthopedics;  Laterality: Right;  Marland Kitchen VIDEO BRONCHOSCOPY WITH  ENDOBRONCHIAL NAVIGATION N/A 02/13/2019   Procedure: VIDEO BRONCHOSCOPY WITH ENDOBRONCHIAL NAVIGATION;  Surgeon: Ottie Glazier, MD;  Location: [facility] ORS;  Service: Thoracic;  Laterality: N/A;  . VIDEO BRONCHOSCOPY WITH ENDOBRONCHIAL NAVIGATION N/A 03/13/2019   Procedure: VIDEO BRONCHOSCOPY WITH ENDOBRONCHIAL NAVIGATION;  Surgeon: Ottie Glazier, MD;  Location: [facility] ORS;  Service: Thoracic;  Laterality: N/A;  . VIDEO BRONCHOSCOPY WITH ENDOBRONCHIAL ULTRASOUND N/A 02/13/2019   Procedure: VIDEO BRONCHOSCOPY WITH ENDOBRONCHIAL ULTRASOUND;  Surgeon: Ottie Glazier, MD;  Location: [facility] ORS;  Service: Thoracic;  Laterality: N/A;  . VIDEO BRONCHOSCOPY WITH ENDOBRONCHIAL ULTRASOUND N/A 03/13/2019   Procedure: VIDEO BRONCHOSCOPY WITH ENDOBRONCHIAL ULTRASOUND;  Surgeon: Ottie Glazier, MD;  Location: [facility] ORS;  Service: Thoracic;  Laterality: N/A;   Social History Social History   Tobacco Use  . Smoking status: Light Tobacco Smoker    Packs/day: 0.50    Years: 30.00    Pack years: 15.00    Types: Cigarettes    Start date: 02/26/1973    Last attempt to quit: 09/21/2018    Years since quitting: 0.5  . Smokeless tobacco: Former Systems developer    Quit date: 02/26/1998  . Tobacco comment: smokes 2-3 cig a week 02/12/19  Substance Use Topics  . Alcohol use: Yes    Alcohol/week: 20.0 standard drinks    Types: 20 Cans of beer per week    Comment: week   . Drug use: Yes    Comment: prescribed hydrocodone   Family History Family History  Problem Relation Age of Onset  . Cancer Mother   . COPD Father 31       black lung  Denies family history of peripheral artery disease, venous disease or renal disease.  Allergies  Allergen Reactions  . Penicillins Swelling, Rash and Other (See Comments)    Swelling in throat  Did it involve swelling of the face/tongue/throat, SOB, or low BP? yes Did it involve sudden or severe rash/hives, skin peeling, or any reaction on the  inside of your mouth or nose? no Did you  need to seek medical attention at a hospital or doctor's office? Yes When did it last happen?years  If all above answers are "NO", may proceed with cephalosporin use.    . Lidocaine Other (See Comments)    Unsure  At dentist's office   REVIEW OF SYSTEMS (Negative unless checked)  Constitutional: _0 Weight loss  _1 Fever  _2 Chills Cardiac: _3 Chest pain   _4 Chest pressure   _5 Palpitations   _6 Shortness of breath when laying flat   _7 Shortness of breath at rest   _8 Shortness of breath with exertion. Vascular:  _9 Pain in legs with walking   _10 Pain in legs at rest   _11 Pain in legs when laying flat   _12 Claudication   _13 Pain in feet when walking  _14 Pain in feet at rest  _15 Pain in feet when laying flat   _16 History of DVT   _17 Phlebitis   _18 Swelling in legs   _19 Varicose veins   _20 Non-healing ulcers Pulmonary:   _21 Uses home oxygen   _22 Productive cough   _23 Hemoptysis   _24 Wheeze  _25 COPD   _26 Asthma Neurologic:  _27 Dizziness  _28 Blackouts   _29 Seizures   _30 History of stroke   _31 History of TIA  _32 Aphasia   _33 Temporary blindness   _34 Dysphagia   _35 Weakness or numbness in arms   _36 Weakness or numbness in legs Musculoskeletal:  _37 Arthritis   _38 Joint swelling   _39 Joint pain   _40 Low back pain Hematologic:  _41 Easy bruising  _42 Easy bleeding   _43 Hypercoagulable state   _44 Anemic  _45 Hepatitis Gastrointestinal:  _46 Blood in stool   _47 Vomiting blood  _48 Gastroesophageal reflux/heartburn   _49 Difficulty swallowing. Genitourinary:  _50 Chronic kidney disease   _51 Difficult urination  _52 Frequent urination  _53 Burning with urination   _54 Blood in urine Skin:  _55 Rashes   _56 Ulcers   _57 Wounds Psychological:  _58 History of anxiety   _59  History of major depression.  Physical Examination  Vitals:   04/09/19 1033 04/09/19 1034 04/09/19 1035 04/09/19 1203  BP:    103/73  Pulse:  87  68  Resp:    18  Temp:    97.8 F (36.6 C)  TempSrc:      SpO2: (!) 89% 91% 98% 99%  Weight:      Height:       Body mass index is 19.94  kg/m. Gen:  WD/WN, NAD Head: [place]/AT, No temporalis wasting. Prominent temp pulse not noted. Ear/Nose/Throat: Hearing grossly intact, nares w/o erythema or drainage, oropharynx w/o Erythema/Exudate Eyes: Sclera non-icteric, conjunctiva clear Neck: Trachea midline.  No JVD.  Pulmonary:  Good air movement, respirations not labored, equal bilaterally.  Cardiac: RRR, normal S1, S2. Vascular:  Vessel Right Left  Radial Palpable Palpable  Ulnar Palpable Palpable  Brachial Palpable Palpable  Carotid Palpable, without bruit Palpable, without bruit  Aorta Not palpable N/A  Femoral Palpable Palpable  Popliteal Palpable Palpable  PT Palpable Palpable  DP Palpable Palpable   Gastrointestinal: soft, non-tender/non-distended. No guarding/reflex.  Musculoskeletal: M/S 5/5 throughout.  Extremities without ischemic changes.  No deformity or atrophy. No edema. Neurologic: Sensation grossly intact in extremities.  Symmetrical.  Speech is fluent. Motor exam as listed above. Psychiatric: Judgment intact, Mood & affect appropriate for pt's clinical situation. Dermatologic: No rashes or ulcers noted.  No cellulitis or open wounds. Lymph : No Cervical, Axillary, or Inguinal lymphadenopathy.  CBC Lab Results  Component Value Date   WBC 3.8 (L) 04/09/2019   HGB 11.5 (L) 04/09/2019   HCT 35.2 (L) 04/09/2019   MCV 90.3 04/09/2019  PLT 224 04/09/2019   BMET    Component Value Date/Time   NA 133 (L) 04/09/2019 0444   NA 138 11/02/2014 1635   NA 139 06/23/2012 1028   K 4.8 04/09/2019 0444   K 4.6 06/23/2012 1028   CL 101 04/09/2019 0444   CL 105 06/23/2012 1028   CO2 21 (L) 04/09/2019 0444   CO2 26 06/23/2012 1028   GLUCOSE 115 (H) 04/09/2019 0444   GLUCOSE 88 06/23/2012 1028   BUN 20 04/09/2019 0444   BUN 14 11/02/2014 1635   BUN 12 06/23/2012 1028   CREATININE 1.16 04/09/2019 0444   CREATININE 0.84 06/23/2012 1028   CALCIUM 8.2 (L) 04/09/2019 0444   CALCIUM 8.8 06/23/2012 1028   GFRNONAA  >60 04/09/2019 0444   GFRNONAA >60 06/23/2012 1028   GFRAA >60 04/09/2019 0444   GFRAA >60 06/23/2012 1028   Estimated Creatinine Clearance: 50.7 mL/min (by C-G formula based on SCr of 1.16 mg/dL).  COAG Lab Results  Component Value Date   INR 1.1 04/07/2019   INR 1.0 02/04/2019   INR 1.1 11/14/2018   Radiology DG Chest 2 View  Result Date: 04/07/2019 CLINICAL DATA:  Chest pain and shortness of breath EXAM: CHEST - 2 VIEW COMPARISON:  March 13, 2019 chest radiograph and chest CT February 13, 2019 FINDINGS: There remains consolidation with volume loss in the right upper lobe with slightly improved aeration in this area compared to prior studies. Elsewhere, there is extensive underlying emphysematous change with fibrosis. Fibrosis is most severe in the right base region. No new opacity evident. Heart size and pulmonary vascularity are normal. No adenopathy is evident. There is aortic atherosclerosis. No bone lesions. There is calcification in each carotid artery. IMPRESSION: Underlying emphysematous change in fibrosis. Consolidation in the right upper lobe is slightly less pronounced than on recent prior studies. No new opacity evident. Stable cardiac silhouette. No adenopathy demonstrable by radiography. Aortic Atherosclerosis (ICD10-I70.0). Electronically Signed   By: Lowella Grip III M.D.   On: 04/07/2019 11:20   CT Angio Chest PE W/Cm &/Or Wo Cm  Result Date: 04/07/2019 CLINICAL DATA:  Shortness of breath. EXAM: CT ANGIOGRAPHY CHEST WITH CONTRAST TECHNIQUE: Multidetector CT imaging of the chest was performed using the standard protocol during bolus administration of intravenous contrast. Multiplanar CT image reconstructions and MIPs were obtained to evaluate the vascular anatomy. CONTRAST:  46m OMNIPAQUE IOHEXOL 350 MG/ML SOLN COMPARISON:  Chest radiograph April 07, 2019; chest CT February 13, 2019 FINDINGS: Cardiovascular: There is no demonstrable pulmonary embolus. Ascending thoracic  aortic diameter measures 4.6 x 4.5 cm. No dissection is appreciable. The contrast bolus in the aorta is somewhat less than optimal for dissection assessment. There is localized aneurysmal dilatation in the distal descending thoracic aorta at the diaphragmatic hiatus with localized plaque and calcification along the leftward aspect of this aneurysm. The descending thoracic aorta at the level of the diaphragmatic hiatus measures 3.8 x 3.1 cm. Appearance of this aneurysm is similar to most recent CT. There is aortic atherosclerosis as well as scattered foci of calcification in proximal visualized great vessels. There are foci of coronary artery calcification. There is left ventricular hypertrophy. No pericardial effusion or pericardial thickening. Mediastinum/Nodes: Thyroid appears unremarkable. There are mildly prominent right hilar lymph nodes, largest measuring 1.5 x 1.3 cm. Subcarinal lymph node measures 1.4 x 1.2 cm. No esophageal lesions are evident. Lungs/Pleura: There is a moderate right pleural effusion with a much smaller left pleural effusion. There is extensive underlying emphysematous change  with fibrosis in the lower lung regions, significantly more on the right than on the left. There are multiple areas of cicatrization throughout the lungs bilaterally, more severe on the right than on the left. There is consolidation in the anterior segment right upper lobe with air bronchograms and volume loss. No similar consolidation elsewhere. Upper Abdomen: There is upper abdominal aortic atherosclerosis. Visualized upper abdominal structures otherwise appear unremarkable. Musculoskeletal: Anterior wedging of a midthoracic vertebral body is stable. No blastic or lytic bone lesions. No evident chest wall lesions. Review of the MIP images confirms the above findings. IMPRESSION: 1.  No demonstrable pulmonary embolus. 2. Prominence of the ascending thoracic aorta with a measured diameter of 4.6 x 4.5 cm. Ascending  thoracic aortic aneurysm. Recommend semi-annual imaging followup by CTA or MRA and referral to cardiothoracic surgery if not already obtained. This recommendation follows 2010 ACCF/AHA/AATS/ACR/ASA/SCA/SCAI/SIR/STS/SVM Guidelines for the Diagnosis and Management of Patients With Thoracic Aortic Disease. Circulation. 2010; 121: W389-H734. Aortic aneurysm NOS (ICD10-I71.9). No dissection evident with contrast bolus somewhat less than optimal for dissection assessment. 3. Localized aneurysmal dilatation of the descending thoracic aorta at the diaphragmatic hiatus level measuring 3.8 x 3.1 cm. Localized plaque and dilatation along the leftward aspect of the aneurysm, also present previously. Note that there is aortic atherosclerosis as well as foci of great vessel and coronary artery calcification. 4. 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 Pleural effusions bilaterally, larger on the right than on the left. Persistent consolidation in the right upper lobe anteriorly with air bronchograms. Appearance most consistent with pneumonia with possibility of superimposed neoplasm not excluded anteriorly toward the apex on the right. 5.  Prominent right hilar and subcarinal lymph nodes. Aortic Atherosclerosis (ICD10-I70.0) and Emphysema (ICD10-J43.9). Aortic aneurysm NOS (ICD10-I71.9). Electronically Signed   By: Lowella Grip III M.D.   On: 04/07/2019 13:17   DG Chest Port 1 View  Result Date: 04/08/2019 CLINICAL DATA:  Post right-sided thoracentesis. EXAM: PORTABLE CHEST 1 VIEW COMPARISON:  04/07/2019; chest CT-04/07/2019 FINDINGS: Grossly unchanged cardiac silhouette and mediastinal contours with persistent obscuration of the right heart border and mediastinal stripe secondary to worsening right upper lobe heterogeneous opacities and associated volume loss. Worsening right mid and bilateral lower lung interstitial thickening. Interval reduction/resolution of right-sided  pleural effusion post thoracentesis. No pneumothorax. No acute osseous abnormalities. IMPRESSION: 1. Interval reduction/resolution of right-sided pleural effusion post thoracentesis. No pneumothorax. 2. Worsening right heterogeneous/consolidative opacities and associated volume loss. 3. Worsening right mid bilateral lower lung interstitial thickening with broad differential considerations including pulmonary edema versus progression of multifocal infection, including atypical etiologies. Electronically Signed   By: Sandi Mariscal M.D.   On: 04/08/2019 17:31   DG Chest Port 1 View  Result Date: 03/13/2019 CLINICAL DATA:  Status post bronchoscopy EXAM: PORTABLE CHEST 1 VIEW COMPARISON:  12/23/2018 FINDINGS: The heart size and mediastinal contours are within normal limits. Redemonstrated masslike consolidation of the anterior right upper lobe. Emphysema. The visualized skeletal structures are unremarkable. IMPRESSION: 1. Redemonstrated masslike consolidation of the anterior right upper lobe. No acute appearing airspace opacity. 2.  Emphysema. Electronically Signed   By: Eddie Candle M.D.   On: 03/13/2019 16:17   DG C-Arm 1-60 Min-No Report  Result Date: 03/13/2019 Fluoroscopy was utilized by the requesting physician.  No radiographic interpretation.   ECHOCARDIOGRAM COMPLETE  Result Date: 04/08/2019    ECHOCARDIOGRAM REPORT   Patient Name:   RASAAN BROTHERTON Date of Exam: 04/07/2019 Medical Rec #:  [id number]        Height:       65.0 in Accession #:    [id number]       Weight:       120.6 lb Date of Birth:  1955/07/13        BSA:          1.60 m Patient Age:    55 years         BP:           121/92 mmHg Patient Gender: M                HR:           96 bpm. Exam Location:  [facility] Procedure: 2D Echo, Cardiac Doppler and Color Doppler Indications:     Elevated Troponin  History:         Patient has no prior history of Echocardiogram examinations.                  Risk Factors:Dyslipidemia and Hypertension. Stroke.  Pneumonia.                  Myocardial Infarction. Dyspnea. Coronary artery disease. COPD.                  Chest pain. AAA.  Sonographer:     Wilford Sports Rodgers-Jones Referring Phys:  [place] Diagnosing Phys: Isaias Cowman MD IMPRESSIONS  1. Left ventricular ejection fraction, by estimation, is 35 to 40%. The left ventricle has moderately decreased function. The left ventrical has no regional wall motion abnormalities. The left ventricular internal cavity size was mildly to moderately dilated. Left ventricular diastolic parameters are indeterminate.  2. Right ventricular systolic function is normal. The right ventricular size is normal.  3. No left atrial/left atrial appendage thrombus was detected.  4. The mitral valve is normal in structure and function. Mild mitral valve regurgitation. No evidence of mitral stenosis.  5. The aortic valve is normal in structure and function. Aortic valve regurgitation is not visualized. Mild to moderate aortic valve stenosis.  6. The inferior vena cava is normal in size with greater than 50% respiratory variability, suggesting right atrial pressure of 3 mmHg. FINDINGS  Left Ventricle: Left ventricular ejection fraction, by estimation, is 35 to 40%. The left ventricle has moderately decreased function. The left ventricle has no regional wall motion abnormalities. The left ventricular internal cavity size was mildly to moderately dilated. There is no. There is no left ventricular hypertrophy. Left ventricular diastolic parameters are indeterminate.  LV Wall Scoring: The basal inferolateral segment, mid inferoseptal segment, apical septal segment, basal inferior segment, and apex are hypokinetic. Right Ventricle: The right ventricular size is normal. No increase in right ventricular wall thickness. Right ventricular systolic function is normal. Left Atrium: Left atrial size was normal in size. Right Atrium: Right atrial size was normal in size. Pericardium:  There is no evidence of pericardial effusion. Mitral Valve: The mitral valve is normal in structure and function. Normal mobility of the mitral valve leaflets. Mild mitral valve regurgitation. No evidence of mitral valve stenosis. Tricuspid Valve: The tricuspid valve is normal in structure. Tricuspid valve regurgitation is mild . No evidence of tricuspid stenosis. Aortic Valve: The aortic valve is normal in structure and function. Aortic valve regurgitation is not visualized. Mild to moderate aortic stenosis is present. Aortic valve mean gradient measures 6.7 mmHg. Aortic valve peak gradient measures 9.2 mmHg. Aortic valve area, by VTI measures 0.96 cm.  Pulmonic Valve: The pulmonic valve was normal in structure. Pulmonic valve regurgitation is not visualized. No evidence of pulmonic stenosis. Aorta: The aortic root is normal in size and structure. Venous: The inferior vena cava is normal in size with greater than 50% respiratory variability, suggesting right atrial pressure of 3 mmHg. The inferior vena cava and the hepatic vein show a normal flow pattern. IAS/Shunts: No atrial level shunt detected by color flow Doppler.  LEFT VENTRICLE PLAX 2D LVIDd:         5.61 cm  Diastology LVIDs:         4.21 cm  LV e' lateral:   4.35 cm/s LV PW:         0.94 cm  LV E/e' lateral: 13.5 LV IVS:        0.68 cm  LV e' medial:    5.00 cm/s LVOT diam:     2.20 cm  LV E/e' medial:  11.7 LV SV:         23.64 ml LV SV Index:   47.63 LVOT Area:     3.80 cm  RIGHT VENTRICLE RV Basal diam:  3.68 cm RV S prime:     10.40 cm/s TAPSE (M-mode): 1.8 cm LEFT ATRIUM           Index       RIGHT ATRIUM           Index LA diam:      4.20 cm 2.63 cm/m  RA Area:     13.50 cm LA Vol (A2C): 40.0 ml 25.07 ml/m RA Volume:   39.70 ml  24.88 ml/m LA Vol (A4C): 17.9 ml 11.22 ml/m  AORTIC VALVE AV Area (Vmax):    0.94 cm AV Area (Vmean):   0.87 cm AV Area (VTI):     0.96 cm AV Vmax:           152.00 cm/s AV Vmean:          122.333 cm/s AV VTI:             0.246 m AV Peak Grad:      9.2 mmHg AV Mean Grad:      6.7 mmHg LVOT Vmax:         37.45 cm/s LVOT Vmean:        27.900 cm/s LVOT VTI:          0.062 m LVOT/AV VTI ratio: 0.25  AORTA Ao Root diam: 3.60 cm MITRAL VALVE MV Area (PHT): 7.66 cm             SHUNTS MV Decel Time: 99 msec              Systemic VTI:  0.06 m MV E velocity: 58.70 cm/s 103 cm/s  Systemic Diam: 2.20 cm MV A velocity: 52.20 cm/s 70.3 cm/s MV E/A ratio:  1.12       1.5 Isaias Cowman MD Electronically signed by Isaias Cowman MD Signature Date/Time: 04/08/2019/1:00:24 PM    Final    US THORACENTESIS ASP PLEURAL SPACE W/IMG GUIDE  Addendum Date: 04/08/2019   ADDENDUM REPORT: 04/08/2019 17:38 ADDENDUM: Note, Nesacaine was utilized for subcutaneous anesthesia purposes given history of lidocaine allergy. Patient tolerated this medication without incident. Electronically Signed   By: Sandi Mariscal M.D.   On: 04/08/2019 17:38   Result Date: 04/08/2019 INDICATION: Symptomatic right-sided sided pleural effusion EXAM: US THORACENTESIS ASP PLEURAL SPACE W/IMG GUIDE COMPARISON:  Chest radiograph-04/07/2019; chest CT-04/06/2018 MEDICATIONS: None. COMPLICATIONS: None immediate. TECHNIQUE: Informed  written consent was obtained from the patient after a discussion of the risks, benefits and alternatives to treatment. A timeout was performed prior to the initiation of the procedure. Initial ultrasound scanning demonstrates a small anechoic right-sided pleural effusion. The lower chest was prepped and draped in the usual sterile fashion. 1% lidocaine was used for local anesthesia. An ultrasound image was saved for documentation purposes. An 8 Fr Safe-T-Centesis catheter was introduced under ultrasound guidance. The thoracentesis was performed. The catheter was removed and a dressing was applied. The patient tolerated the procedure well without immediate post procedural complication. The patient was escorted to have an upright chest radiograph.  FINDINGS: A total of approximately 450 cc of serous fluid was removed. Requested samples were sent to the laboratory. IMPRESSION: Successful ultrasound-guided right sided thoracentesis yielding 450 cc of pleural fluid. Electronically Signed: By: Sandi Mariscal M.D. On: 04/08/2019 17:33   Assessment/Plan The patient is a 64 year old male well-known to our service as we follow him for his ascending aortic aneurysm as well as abdominal aortic aneurysm who presented to the [facility] emergency department with a chief complaint of progressively worsening shortness of breath.  1. Thoracic Aneurysm: Known history. CTA Chest on 04/07/19: 1. Prominence of the ascending thoracic aorta with a measured diameter of 4.6cm x 4.5cm. 2. Aneurysmal dilatation of the descending thoracic aorta at the diaphragmatic hiatus level measuring 3.8cm x 3.1cm.  Thoracic aneurysms are currently stable without dissection or rupture.  At this time, they are not large enough for intervention would recommend to continue yearly CTA follow-up.  As per flow sheet, patient's blood pressure seems to be controlled.  Encouraged continued control as this directly affects aneurysmal degeneration / disease.  2.  Abdominal aortic aneurysm: Known history. CTA Chest / Abdomen / Pelvis (10/14/18): Enlarging 4.8 cm fusiform infrarenal abdominal aortic aneurysm (previously 3.7 cm in 2017). Last seen in our outpatient setting in November 2020.  The patient is a candidate for endovascular repair however has not been able to obtain cardiac / pulmonary clearance due to active infection with MAC.  Unfortunately, we can not move forward placing an aortic stent graft with an active infection. Mortality of an infected aortic stent graft is upward of 100%. As per flow sheet, patient's blood pressure seems to be controlled.  Encouraged continued control as this directly affects aneurysmal degeneration / disease.  Will see patient back in  our office in about one month.    3. Hyperlipidemia: Patient is currently on statin.  Would also add the addition of aspirin 81 mg daily. Encouraged good control as its slows the progression of atherosclerotic disease.  Discussed with Dr. Francene Castle, PA-C  04/09/2019 12:24 PM  This note was created with Dragon medical transcription system.  Any error is purely unintentional

## 2019-04-09 NOTE — Progress Notes (Signed)
[facility]    SUBJECTIVE: The patient reports feeling much better this morning. He denies chest pain. He reports that his breathing is much better status post thoracentesis. He reports that he slept better last night than he has in a while. He reports that if it weren't for his cough, he would feel great.    Vitals:   04/08/19 1835 04/08/19 2006 04/08/19 2006 04/09/19 0337  BP:   106/66 103/78  Pulse: 88  91 79  Resp:    17  Temp:   98.3 F (36.8 C) (!) 97.4 F (36.3 C)  TempSrc:   Oral Oral  SpO2: 97% 92% 91% 92%  Weight:      Height:         Intake/Output Summary (Last 24 hours) at 04/09/2019 0754 Last data filed at 04/09/2019 5852 Gross per 24 hour  Intake 133.89 ml  Output 0 ml  Net 133.89 ml      PHYSICAL EXAM  General: Well developed, well nourished, in no acute distress, sitting up watching TV HEENT:  Normocephalic and atramatic Neck:  No JVD.  Lungs: normal effort of breathing on supplemental O2 via [place]; bibasilar crackles, more diminished breath sounds on left base Heart: HRRR . Normal S1 and S2 without gallops or murmurs.  Abdomen: nondistended Msk:  Back normal, gait not assessed. No obvious deformity. Extremities: No clubbing, cyanosis or edema.   Neuro: Alert and oriented X 3. Psych:  Good affect, responds appropriately   LABS: Basic Metabolic Panel: Recent Labs    04/07/19 1131 04/09/19 0444  NA 134* 133*  K 4.0 4.8  CL 97* 101  CO2 24 21*  GLUCOSE 104* 115*  BUN 15 20  CREATININE 1.18 1.16  CALCIUM 8.7* 8.2*   Liver Function Tests: Recent Labs    04/09/19 0444  AST 38  ALT 21  ALKPHOS 99  BILITOT 0.9  PROT 6.0*  ALBUMIN 2.8*   No results for input(s): LIPASE, AMYLASE in the last 72 hours. CBC: Recent Labs    04/08/19 0203 04/09/19 0444  WBC 8.0 3.8*  HGB 10.8* 11.5*  HCT 32.3* 35.2*  MCV 89.5 90.3  PLT 190 224   Cardiac Enzymes: No results for input(s): CKTOTAL, CKMB, CKMBINDEX, TROPONINI in the last 72  hours. BNP: Invalid input(s): POCBNP D-Dimer: No results for input(s): DDIMER in the last 72 hours. Hemoglobin A1C: No results for input(s): HGBA1C in the last 72 hours. Fasting Lipid Panel: No results for input(s): CHOL, HDL, LDLCALC, TRIG, CHOLHDL, LDLDIRECT in the last 72 hours. Thyroid Function Tests: Recent Labs    04/07/19 1326  TSH 2.292   Anemia Panel: No results for input(s): VITAMINB12, FOLATE, FERRITIN, TIBC, IRON, RETICCTPCT in the last 72 hours.  DG Chest 2 View  Result Date: 04/07/2019 CLINICAL DATA:  Chest pain and shortness of breath EXAM: CHEST - 2 VIEW COMPARISON:  March 13, 2019 chest radiograph and chest CT February 13, 2019 FINDINGS: There remains consolidation with volume loss in the right upper lobe with slightly improved aeration in this area compared to prior studies. Elsewhere, there is extensive underlying emphysematous change with fibrosis. Fibrosis is most severe in the right base region. No new opacity evident. Heart size and pulmonary vascularity are normal. No adenopathy is evident. There is aortic atherosclerosis. No bone lesions. There is calcification in each carotid artery. IMPRESSION: Underlying emphysematous change in fibrosis. Consolidation in the right upper lobe is slightly less pronounced than on recent prior studies. No new opacity evident. Stable  cardiac silhouette. No adenopathy demonstrable by radiography. Aortic Atherosclerosis (ICD10-I70.0). Electronically Signed   By: Lowella Grip III M.D.   On: 04/07/2019 11:20   CT Angio Chest PE W/Cm &/Or Wo Cm  Result Date: 04/07/2019 CLINICAL DATA:  Shortness of breath. EXAM: CT ANGIOGRAPHY CHEST WITH CONTRAST TECHNIQUE: Multidetector CT imaging of the chest was performed using the standard protocol during bolus administration of intravenous contrast. Multiplanar CT image reconstructions and MIPs were obtained to evaluate the vascular anatomy. CONTRAST:  61m OMNIPAQUE IOHEXOL 350 MG/ML SOLN  COMPARISON:  Chest radiograph April 07, 2019; chest CT February 13, 2019 FINDINGS: Cardiovascular: There is no demonstrable pulmonary embolus. Ascending thoracic aortic diameter measures 4.6 x 4.5 cm. No dissection is appreciable. The contrast bolus in the aorta is somewhat less than optimal for dissection assessment. There is localized aneurysmal dilatation in the distal descending thoracic aorta at the diaphragmatic hiatus with localized plaque and calcification along the leftward aspect of this aneurysm. The descending thoracic aorta at the level of the diaphragmatic hiatus measures 3.8 x 3.1 cm. Appearance of this aneurysm is similar to most recent CT. There is aortic atherosclerosis as well as scattered foci of calcification in proximal visualized great vessels. There are foci of coronary artery calcification. There is left ventricular hypertrophy. No pericardial effusion or pericardial thickening. Mediastinum/Nodes: Thyroid appears unremarkable. There are mildly prominent right hilar lymph nodes, largest measuring 1.5 x 1.3 cm. Subcarinal lymph node measures 1.4 x 1.2 cm. No esophageal lesions are evident. Lungs/Pleura: There is a moderate right pleural effusion with a much smaller left pleural effusion. There is extensive underlying emphysematous change with fibrosis in the lower lung regions, significantly more on the right than on the left. There are multiple areas of cicatrization throughout the lungs bilaterally, more severe on the right than on the left. There is consolidation in the anterior segment right upper lobe with air bronchograms and volume loss. No similar consolidation elsewhere. Upper Abdomen: There is upper abdominal aortic atherosclerosis. Visualized upper abdominal structures otherwise appear unremarkable. Musculoskeletal: Anterior wedging of a midthoracic vertebral body is stable. No blastic or lytic bone lesions. No evident chest wall lesions. Review of the MIP images confirms the  above findings. IMPRESSION: 1.  No demonstrable pulmonary embolus. 2. Prominence of the ascending thoracic aorta with a measured diameter of 4.6 x 4.5 cm. Ascending thoracic aortic aneurysm. Recommend semi-annual imaging followup by CTA or MRA and referral to cardiothoracic surgery if not already obtained. This recommendation follows 2010 ACCF/AHA/AATS/ACR/ASA/SCA/SCAI/SIR/STS/SVM Guidelines for the Diagnosis and Management of Patients With Thoracic Aortic Disease. Circulation. 2010; 121:: J179-X505 Aortic aneurysm NOS (ICD10-I71.9). No dissection evident with contrast bolus somewhat less than optimal for dissection assessment. 3. Localized aneurysmal dilatation of the descending thoracic aorta at the diaphragmatic hiatus level measuring 3.8 x 3.1 cm. Localized plaque and dilatation along the leftward aspect of the aneurysm, also present previously. Note that there is aortic atherosclerosis as well as foci of great vessel and coronary artery calcification. 4. 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 Pleural effusions bilaterally, larger on the right than on the left. Persistent consolidation in the right upper lobe anteriorly with air bronchograms. Appearance most consistent with pneumonia with possibility of superimposed neoplasm not excluded anteriorly toward the apex on the right. 5.  Prominent right hilar and subcarinal lymph nodes. Aortic Atherosclerosis (ICD10-I70.0) and Emphysema (ICD10-J43.9). Aortic aneurysm NOS (ICD10-I71.9). Electronically Signed   By: WLowella GripIII M.D.   On: 04/07/2019  13:17   DG Chest Port 1 View  Result Date: 04/08/2019 CLINICAL DATA:  Post right-sided thoracentesis. EXAM: PORTABLE CHEST 1 VIEW COMPARISON:  04/07/2019; chest CT-04/07/2019 FINDINGS: Grossly unchanged cardiac silhouette and mediastinal contours with persistent obscuration of the right heart border and mediastinal stripe secondary to worsening right upper lobe  heterogeneous opacities and associated volume loss. Worsening right mid and bilateral lower lung interstitial thickening. Interval reduction/resolution of right-sided pleural effusion post thoracentesis. No pneumothorax. No acute osseous abnormalities. IMPRESSION: 1. Interval reduction/resolution of right-sided pleural effusion post thoracentesis. No pneumothorax. 2. Worsening right heterogeneous/consolidative opacities and associated volume loss. 3. Worsening right mid bilateral lower lung interstitial thickening with broad differential considerations including pulmonary edema versus progression of multifocal infection, including atypical etiologies. Electronically Signed   By: Sandi Mariscal M.D.   On: 04/08/2019 17:31   ECHOCARDIOGRAM COMPLETE  Result Date: 04/08/2019    ECHOCARDIOGRAM REPORT   Patient Name:   SHAIL URBAS Date of Exam: 04/07/2019 Medical Rec #:  [id number]        Height:       65.0 in Accession #:    [id number]       Weight:       120.6 lb Date of Birth:  Jul 25, 1955        BSA:          1.60 m Patient Age:    65 years         BP:           121/92 mmHg Patient Gender: M                HR:           96 bpm. Exam Location:  [facility] Procedure: 2D Echo, Cardiac Doppler and Color Doppler Indications:     Elevated Troponin  History:         Patient has no prior history of Echocardiogram examinations.                  Risk Factors:Dyslipidemia and Hypertension. Stroke. Pneumonia.                  Myocardial Infarction. Dyspnea. Coronary artery disease. COPD.                  Chest pain. AAA.  Sonographer:     Wilford Sports Rodgers-Jones Referring Phys:  [place] Diagnosing Phys: Isaias Cowman MD IMPRESSIONS  1. Left ventricular ejection fraction, by estimation, is 35 to 40%. The left ventricle has moderately decreased function. The left ventrical has no regional wall motion abnormalities. The left ventricular internal cavity size was mildly to moderately dilated. Left ventricular  diastolic parameters are indeterminate.  2. Right ventricular systolic function is normal. The right ventricular size is normal.  3. No left atrial/left atrial appendage thrombus was detected.  4. The mitral valve is normal in structure and function. Mild mitral valve regurgitation. No evidence of mitral stenosis.  5. The aortic valve is normal in structure and function. Aortic valve regurgitation is not visualized. Mild to moderate aortic valve stenosis.  6. The inferior vena cava is normal in size with greater than 50% respiratory variability, suggesting right atrial pressure of 3 mmHg. FINDINGS  Left Ventricle: Left ventricular ejection fraction, by estimation, is 35 to 40%. The left ventricle has moderately decreased function. The left ventricle has no regional wall motion abnormalities. The left ventricular internal cavity size was mildly to moderately dilated. There is no. There is no  left ventricular hypertrophy. Left ventricular diastolic parameters are indeterminate.  LV Wall Scoring: The basal inferolateral segment, mid inferoseptal segment, apical septal segment, basal inferior segment, and apex are hypokinetic. Right Ventricle: The right ventricular size is normal. No increase in right ventricular wall thickness. Right ventricular systolic function is normal. Left Atrium: Left atrial size was normal in size. Right Atrium: Right atrial size was normal in size. Pericardium: There is no evidence of pericardial effusion. Mitral Valve: The mitral valve is normal in structure and function. Normal mobility of the mitral valve leaflets. Mild mitral valve regurgitation. No evidence of mitral valve stenosis. Tricuspid Valve: The tricuspid valve is normal in structure. Tricuspid valve regurgitation is mild . No evidence of tricuspid stenosis. Aortic Valve: The aortic valve is normal in structure and function. Aortic valve regurgitation is not visualized. Mild to moderate aortic stenosis is present. Aortic valve  mean gradient measures 6.7 mmHg. Aortic valve peak gradient measures 9.2 mmHg. Aortic valve area, by VTI measures 0.96 cm. Pulmonic Valve: The pulmonic valve was normal in structure. Pulmonic valve regurgitation is not visualized. No evidence of pulmonic stenosis. Aorta: The aortic root is normal in size and structure. Venous: The inferior vena cava is normal in size with greater than 50% respiratory variability, suggesting right atrial pressure of 3 mmHg. The inferior vena cava and the hepatic vein show a normal flow pattern. IAS/Shunts: No atrial level shunt detected by color flow Doppler.  LEFT VENTRICLE PLAX 2D LVIDd:         5.61 cm  Diastology LVIDs:         4.21 cm  LV e' lateral:   4.35 cm/s LV PW:         0.94 cm  LV E/e' lateral: 13.5 LV IVS:        0.68 cm  LV e' medial:    5.00 cm/s LVOT diam:     2.20 cm  LV E/e' medial:  11.7 LV SV:         23.64 ml LV SV Index:   47.63 LVOT Area:     3.80 cm  RIGHT VENTRICLE RV Basal diam:  3.68 cm RV S prime:     10.40 cm/s TAPSE (M-mode): 1.8 cm LEFT ATRIUM           Index       RIGHT ATRIUM           Index LA diam:      4.20 cm 2.63 cm/m  RA Area:     13.50 cm LA Vol (A2C): 40.0 ml 25.07 ml/m RA Volume:   39.70 ml  24.88 ml/m LA Vol (A4C): 17.9 ml 11.22 ml/m  AORTIC VALVE AV Area (Vmax):    0.94 cm AV Area (Vmean):   0.87 cm AV Area (VTI):     0.96 cm AV Vmax:           152.00 cm/s AV Vmean:          122.333 cm/s AV VTI:            0.246 m AV Peak Grad:      9.2 mmHg AV Mean Grad:      6.7 mmHg LVOT Vmax:         37.45 cm/s LVOT Vmean:        27.900 cm/s LVOT VTI:          0.062 m LVOT/AV VTI ratio: 0.25  AORTA Ao Root diam: 3.60 cm MITRAL VALVE MV Area (PHT): 7.66  cm             SHUNTS MV Decel Time: 99 msec              Systemic VTI:  0.06 m MV E velocity: 58.70 cm/s 103 cm/s  Systemic Diam: 2.20 cm MV A velocity: 52.20 cm/s 70.3 cm/s MV E/A ratio:  1.12       1.5 Isaias Cowman MD Electronically signed by Isaias Cowman MD Signature  Date/Time: 04/08/2019/1:00:24 PM    Final    US THORACENTESIS ASP PLEURAL SPACE W/IMG GUIDE  Addendum Date: 04/08/2019   ADDENDUM REPORT: 04/08/2019 17:38 ADDENDUM: Note, Nesacaine was utilized for subcutaneous anesthesia purposes given history of lidocaine allergy. Patient tolerated this medication without incident. Electronically Signed   By: Sandi Mariscal M.D.   On: 04/08/2019 17:38   Result Date: 04/08/2019 INDICATION: Symptomatic right-sided sided pleural effusion EXAM: US THORACENTESIS ASP PLEURAL SPACE W/IMG GUIDE COMPARISON:  Chest radiograph-04/07/2019; chest CT-04/06/2018 MEDICATIONS: None. COMPLICATIONS: None immediate. TECHNIQUE: Informed written consent was obtained from the patient after a discussion of the risks, benefits and alternatives to treatment. A timeout was performed prior to the initiation of the procedure. Initial ultrasound scanning demonstrates a small anechoic right-sided pleural effusion. The lower chest was prepped and draped in the usual sterile fashion. 1% lidocaine was used for local anesthesia. An ultrasound image was saved for documentation purposes. An 8 Fr Safe-T-Centesis catheter was introduced under ultrasound guidance. The thoracentesis was performed. The catheter was removed and a dressing was applied. The patient tolerated the procedure well without immediate post procedural complication. The patient was escorted to have an upright chest radiograph. FINDINGS: A total of approximately 450 cc of serous fluid was removed. Requested samples were sent to the laboratory. IMPRESSION: Successful ultrasound-guided right sided thoracentesis yielding 450 cc of pleural fluid. Electronically Signed: By: Sandi Mariscal M.D. On: 04/08/2019 17:33     Echo LVEF 35-40%, no regional wall motion abnormalities, mild MR, mild to moderate AS  TELEMETRY: sinus rhythm, 80s.  ASSESSMENT AND PLAN:  Principal Problem:   NSTEMI (non-ST elevated myocardial infarction) ([place]) Active  Problems:   Benign hypertension   AAA (abdominal aortic aneurysm) ([facility])   COPD exacerbation ([place])   Essential hypertension   Gastroesophageal reflux disease without esophagitis    1. Borderline elevated hs-troponin (169, 129, 119, 87 ), trending down, in the absence of angina, with abnormal ECG, in the setting of tachypnea, COPD and MAC. Patient denies ever experiencing chest pain, reports his breathing is better. The patient did not have an NSTEMI. 2. LV dysfunction; LVEF is 35-40% as compared to previous echo in 02/2018, which revealed LVEF 50%. Patient on beta blocker and ARB.  3. Aortic atherosclerosis, on high intensity atorvastatin and aspirin 4. COPD/cavitary MAC, on triple antibiotic regimen 5. Aortic aneurysm, followed by vascular   Plan: 1. Defer cardiac catheterization in the absence of chest pain and down trending troponin; recommend outpatient ischemic work-up with Dr. Ubaldo Glassing for further evaluation of reduced LVEF.  2. Continue beta blocker and ARB. Add Lasix if needed. 3. Continue atorvastatin and aspirin 4. No further cardiac diagnostics recommended at this time; patient stable for discharge from cardiovascular perspective. 5. Recommend follow up with Dr. Ubaldo Glassing in 1 week from discharge.   Clabe Seal, PA-C 04/09/2019 7:54 AM Sign off for now; please call with any questions. Discussed with Dr. Saralyn Pilar who agrees with above plan.

## 2019-04-10 DIAGNOSIS — R0902 Hypoxemia: Secondary | ICD-10-CM

## 2019-04-10 LAB — COMPREHENSIVE METABOLIC PANEL
ALT: 25 U/L (ref 0–44)
AST: 29 U/L (ref 15–41)
Albumin: 2.5 g/dL — ABNORMAL LOW (ref 3.5–5.0)
Alkaline Phosphatase: 96 U/L (ref 38–126)
Anion gap: 8 (ref 5–15)
BUN: 24 mg/dL — ABNORMAL HIGH (ref 8–23)
CO2: 22 mmol/L (ref 22–32)
Calcium: 8.6 mg/dL — ABNORMAL LOW (ref 8.9–10.3)
Chloride: 104 mmol/L (ref 98–111)
Creatinine, Ser: 1.34 mg/dL — ABNORMAL HIGH (ref 0.61–1.24)
GFR calc Af Amer: >60 mL/min (ref >60)
GFR calc non Af Amer: 56 mL/min — ABNORMAL LOW (ref >60)
Glucose, Bld: 105 mg/dL — ABNORMAL HIGH (ref 70–99)
Potassium: 4.5 mmol/L (ref 3.5–5.1)
Sodium: 134 mmol/L — ABNORMAL LOW (ref 135–145)
Total Bilirubin: 0.8 mg/dL (ref 0.3–1.2)
Total Protein: 5.9 g/dL — ABNORMAL LOW (ref 6.5–8.1)

## 2019-04-10 LAB — CBC
HCT: 36.1 % — ABNORMAL LOW (ref 39.0–52.0)
Hemoglobin: 12.1 g/dL — ABNORMAL LOW (ref 13.0–17.0)
MCH: 30 pg (ref 26.0–34.0)
MCHC: 33.5 g/dL (ref 30.0–36.0)
MCV: 89.6 fL (ref 80.0–100.0)
Platelets: 260 10*3/uL (ref 150–400)
RBC: 4.03 MIL/uL — ABNORMAL LOW (ref 4.22–5.81)
RDW: 14.1 % (ref 11.5–15.5)
WBC: 9.5 10*3/uL (ref 4.0–10.5)
nRBC: 0 % (ref 0.0–0.2)

## 2019-04-10 LAB — ACID FAST SMEAR (AFB, MYCOBACTERIA): Acid Fast Smear: NEGATIVE

## 2019-04-10 LAB — TRIGLYCERIDES, BODY FLUIDS: Triglycerides, Fluid: 13 mg/dL

## 2019-04-10 MED ORDER — ALBUTEROL SULFATE (2.5 MG/3ML) 0.083% IN NEBU: 2.5000 mg | INHALATION_SOLUTION | RESPIRATORY_TRACT | Status: DC | PRN

## 2019-04-10 NOTE — Progress Notes (Signed)
ID Says she has sob today Was better yesterday Coughing a lot   Patient Vitals for the past 24 hrs:  BP Temp Temp src Pulse Resp SpO2  04/10/19 1550 (!) 138/95 97.8 F (36.6 C) -- 83 18 94 %  04/10/19 1134 104/75 97.9 F (36.6 C) Oral 77 18 96 %  04/10/19 0812 -- -- -- 76 16 (!) 88 %  04/10/19 0804 109/75 98.2 F (36.8 C) Oral 75 18 97 %  04/10/19 0443 106/78 97.8 F (36.6 C) Oral 77 19 93 %  04/09/19 2004 -- -- -- -- -- 94 %  04/09/19 1933 99/77 97.9 F (36.6 C) Oral 73 20 95 %    Awake and alert On nasal oxygen Chest b/l rhonchi [id number]   Labs CBC Latest Ref Rng & Units 04/10/2019 04/09/2019 04/08/2019  WBC 4.0 - 10.5 K/uL 9.5 3.8(L) 8.0  Hemoglobin 13.0 - 17.0 g/dL 12.1(L) 11.5(L) 10.8(L)  Hematocrit 39.0 - 52.0 % 36.1(L) 35.2(L) 32.3(L)  Platelets 150 - 400 K/uL 260 224 190    CMP Latest Ref Rng & Units 04/10/2019 04/09/2019 04/07/2019  Glucose 70 - 99 mg/dL 105(H) 115(H) 104(H)  BUN 8 - 23 mg/dL 24(H) 20 15  Creatinine 0.61 - 1.24 mg/dL 1.34(H) 1.16 1.18  Sodium 135 - 145 mmol/L 134(L) 133(L) 134(L)  Potassium 3.5 - 5.1 mmol/L 4.5 4.8 4.0  Chloride 98 - 111 mmol/L 104 101 97(L)  CO2 22 - 32 mmol/L 22 21(L) 24  Calcium 8.9 - 10.3 mg/dL 8.6(L) 8.2(L) 8.7(L)  Total Protein 6.5 - 8.1 g/dL 5.9(L) 6.0(L) -  Total Bilirubin 0.3 - 1.2 mg/dL 0.8 0.9 -  Alkaline Phos 38 - 126 U/L 96 99 -  AST 15 - 41 U/L 29 38 -  ALT 0 - 44 U/L 25 21 -    Impression/recommendation  Cavitary pulmonary MAC rt lung- On Azithromycin, rifabutin, rifampin and was started on Amikacin 2 days ago- Creatinine increasing Will not be able to continue amikacin as risk of renal failure. So will do three oral antibiotics for now- Will continue antiemetic -zofran so that he will be able to take the triple antibiotic- If needed the antibiotic can be spaced rather than giving it all at one time  COPD  Hypoxia due to the above  AAA aneurysm  Discussed the management with the patient

## 2019-04-10 NOTE — Progress Notes (Signed)
Pulmonary Medicine          Date: 04/10/2019,   MRN# [id number] Joseph Peters [id number]     AdmissionWeight: 45.8 kg                 CurrentWeight: 54.3 kg   Referring Physician: Dr Jimmye Norman   CHIEF COMPLAINT:   Advanced Emphysema with Mycobacterium Avium Complex    HISTORY OF PRESENT ILLNESS   Patient laying in bed, states he feels tired.  He is on only 1L/min [place].  He can be discharged home from pulmonary perspective with outpatient follow up   PAST MEDICAL HISTORY   Past Medical History:  Diagnosis Date  . AAA (abdominal aortic aneurysm) without rupture ([place])   . Abdominal aneurysm ([place]) 2015   being followed but not large enough to surgically treat  . Alcoholism ([place])   . Anxiety   . Arthritis   . Asthma   . Benign hypertension 07/16/2014  . Chest pain on exertion 07/16/2014  . Chronic pain syndrome   . Coccydynia 02/08/2012  . COPD (chronic obstructive pulmonary disease) ([place])    patient smokes  . Coronary artery disease   . 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 Depression   . 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 Dyspnea   . Frequent falls   . GERD (gastroesophageal reflux disease)   . Hyperlipidemia   . IBS (irritable bowel syndrome)   . Myocardial infarction ([place]) 2010  . Neuropathy   . Pneumonia   . Prostatitis   . Restless leg syndrome   . Sciatica   . Spinal stenosis of lumbar region   . Stroke ([place]) 2015   no residual symptoms  . Tremor, essential      SURGICAL HISTORY   Past Surgical History:  Procedure Laterality Date  . CARDIAC CATHETERIZATION  2012  . COLONOSCOPY WITH PROPOFOL N/A 11/14/2016   Procedure: COLONOSCOPY WITH PROPOFOL;  Surgeon: Manya Silvas, MD;  Location[facility] ENDOSCOPY;  Service: Endoscopy;  Laterality: N/A;  . ESOPHAGOGASTRODUODENOSCOPY (EGD) WITH PROPOFOL N/A 11/14/2016   Procedure: ESOPHAGOGASTRODUODENOSCOPY (EGD) WITH PROPOFOL;  Surgeon: Manya Silvas, MD;   Location: [facility] ENDOSCOPY;  Service: Endoscopy;  Laterality: N/A;  . JOINT REPLACEMENT Right    hip  . KNEE SURGERY     right  . SHOULDER ARTHROSCOPY WITH OPEN ROTATOR CUFF REPAIR Right 08/20/2017   Procedure: SHOULDER ARTHROSCOPY WITH OPEN ROTATOR CUFF REPAIR;  Surgeon: Corky Mull, MD;  Location: [facility] ORS;  Service: Orthopedics;  Laterality: Right;  . SHOULDER ARTHROSCOPY WITH SUBACROMIAL DECOMPRESSION, ROTATOR CUFF REPAIR AND BICEP TENDON REPAIR Left 05/10/2016   Procedure: SHOULDER ARTHROSCOPY WITH DEBTRIDEMENT, DECOMPRESSION, REPAIR OF MASSIVE ROTATOR CUFF TEAR AND BICEP TENODESIS;  Surgeon: Corky Mull, MD;  Location: [facility] ORS;  Service: Orthopedics;  Laterality: Left;  Massive cuff tear  . TOTAL HIP ARTHROPLASTY Right 07/01/2018   Procedure: TOTAL HIP ARTHROPLASTY - RIGHT;  Surgeon: Corky Mull, MD;  Location: [facility] ORS;  Service: Orthopedics;  Laterality: Right;  Marland Kitchen VIDEO BRONCHOSCOPY WITH ENDOBRONCHIAL NAVIGATION N/A 02/13/2019   Procedure: VIDEO BRONCHOSCOPY WITH ENDOBRONCHIAL NAVIGATION;  Surgeon: Ottie Glazier, MD;  Location: [facility] ORS;  Service: Thoracic;  Laterality: N/A;  . VIDEO BRONCHOSCOPY WITH ENDOBRONCHIAL NAVIGATION N/A 03/13/2019   Procedure: VIDEO BRONCHOSCOPY WITH ENDOBRONCHIAL NAVIGATION;  Surgeon: Ottie Glazier, MD;  Location: [facility] ORS;  Service: Thoracic;  Laterality: N/A;  . VIDEO BRONCHOSCOPY WITH ENDOBRONCHIAL ULTRASOUND N/A 02/13/2019  Procedure: VIDEO BRONCHOSCOPY WITH ENDOBRONCHIAL ULTRASOUND;  Surgeon: Ottie Glazier, MD;  Location: [facility] ORS;  Service: Thoracic;  Laterality: N/A;  . VIDEO BRONCHOSCOPY WITH ENDOBRONCHIAL ULTRASOUND N/A 03/13/2019   Procedure: VIDEO BRONCHOSCOPY WITH ENDOBRONCHIAL ULTRASOUND;  Surgeon: Ottie Glazier, MD;  Location: [facility] ORS;  Service: Thoracic;  Laterality: N/A;     FAMILY HISTORY   Family History  Problem Relation Age of Onset  . Cancer Mother   . COPD Father 46       black lung     SOCIAL HISTORY   Social  History   Tobacco Use  . Smoking status: Light Tobacco Smoker    Packs/day: 0.50    Years: 30.00    Pack years: 15.00    Types: Cigarettes    Start date: 02/26/1973    Last attempt to quit: 09/21/2018    Years since quitting: 63.5  . Smokeless tobacco: Former Systems developer    Quit date: 02/26/1998  . Tobacco comment: smokes 2-3 cig a week 02/12/19  Substance Use Topics  . Alcohol use: Yes    Alcohol/week: 20.0 standard drinks    Types: 20 Cans of beer per week    Comment: week   . Drug use: Yes    Comment: prescribed hydrocodone     MEDICATIONS    Home Medication:    Current Medication:  Current Facility-Administered Medications:  .  0.9 %  sodium chloride infusion, 250 mL, Intravenous, PRN, Para Skeans, MD, Last Rate: 10 mL/hr at 04/08/19 2008, 250 mL at 04/08/19 2008 .  acetaminophen (TYLENOL) tablet 650 mg, 650 mg, Oral, Q4H PRN, Posey Pronto, Ekta V, MD .  albuterol (PROVENTIL) (2.5 MG/3ML) 0.083% nebulizer solution 2.5 mg, 2.5 mg, Nebulization, Q4H PRN, Wyvonnia Dusky, MD .  albuterol (VENTOLIN HFA) 108 (90 Base) MCG/ACT inhaler 2 puff, 2 puff, Inhalation, Once, Blake Divine, MD .  aspirin EC tablet 81 mg, 81 mg, Oral, Daily, Para Skeans, MD, 81 mg at 04/10/19 0847 .  azithromycin (ZITHROMAX) tablet 500 mg, 500 mg, Oral, Daily, Florina Ou V, MD, 500 mg at 04/10/19 0847 .  benzonatate (TESSALON) capsule 200 mg, 200 mg, Oral, TID PRN, Para Skeans, MD, 200 mg at 04/10/19 0848 .  budesonide (PULMICORT) nebulizer solution 0.5 mg, 0.5 mg, Nebulization, BID, Lanney Gins, Lilybeth Vien, MD, 0.5 mg at 04/10/19 0812 .  chloroprocaine (PF) (NESACAINE-MPF) 2 % injection 10 mL, 10 mL, Other, Once, Sandi Mariscal, MD .  enoxaparin (LOVENOX) injection 40 mg, 40 mg, Subcutaneous, Q24H, Wyvonnia Dusky, MD, 40 mg at 04/09/19 2051 .  ethambutol (MYAMBUTOL) tablet 800 mg, 800 mg, Oral, Daily, Florina Ou V, MD, 800 mg at 04/10/19 0847 .  finasteride (PROSCAR) tablet 5 mg, 5 mg, Oral, Daily, Florina Ou  V, MD, 5 mg at 04/10/19 0847 .  FLUoxetine (PROZAC) capsule 20 mg, 20 mg, Oral, Daily, Florina Ou V, MD, 20 mg at 04/10/19 0847 .  gabapentin (NEURONTIN) capsule 900 mg, 900 mg, Oral, QHS, Wyvonnia Dusky, MD, 900 mg at 04/09/19 2050 .  guaiFENesin-dextromethorphan (ROBITUSSIN DM) 100-10 MG/5ML syrup 10 mL, 10 mL, Oral, Q4H PRN, Para Skeans, MD, 10 mL at 04/07/19 2354 .  ipratropium-albuterol (DUONEB) 0.5-2.5 (3) MG/3ML nebulizer solution 3 mL, 3 mL, Nebulization, TID, Lanney Gins, Falan Hensler, MD, 3 mL at 04/10/19 1451 .  losartan (COZAAR) tablet 25 mg, 25 mg, Oral, Daily, Florina Ou V, MD, 25 mg at 04/10/19 0848 .  methylPREDNISolone sodium succinate (SOLU-MEDROL) 40 mg/mL injection 40 mg, 40 mg, Intravenous, Q12H,  Ottie Glazier, MD, 40 mg at 04/10/19 0601 .  metoprolol succinate (TOPROL-XL) 24 hr tablet 25 mg, 25 mg, Oral, Daily, Paraschos, Alexander, MD, 25 mg at 04/10/19 0847 .  nitroGLYCERIN (NITROSTAT) SL tablet 0.4 mg, 0.4 mg, Sublingual, Q5 Min x 3 PRN, Florina Ou V, MD .  ondansetron (ZOFRAN) injection 4 mg, 4 mg, Intravenous, Q6H PRN, Florina Ou V, MD .  ondansetron (ZOFRAN-ODT) disintegrating tablet 4 mg, 4 mg, Oral, Q24H, Ravishankar, Jayashree, MD, 4 mg at 04/10/19 0847 .  oxyCODONE (Oxy IR/ROXICODONE) immediate release tablet 10 mg, 10 mg, Oral, QID PRN, Oswald Hillock, RPH .  rifampin (RIFADIN) capsule 600 mg, 600 mg, Oral, Daily, Ravishankar, Jayashree, MD, 600 mg at 04/10/19 0848 .  rosuvastatin (CRESTOR) tablet 20 mg, 20 mg, Oral, q1800, Wyvonnia Dusky, MD, 20 mg at 04/09/19 1655 .  sodium chloride flush (NS) 0.9 % injection 3 mL, 3 mL, Intravenous, Q12H, Para Skeans, MD, 3 mL at 04/10/19 0848 .  sodium chloride flush (NS) 0.9 % injection 3 mL, 3 mL, Intravenous, PRN, Florina Ou V, MD .  sodium chloride HYPERTONIC 3 % nebulizer solution 4 mL, 4 mL, Nebulization, Q24H, Mathea Frieling, MD, 4 mL at 04/10/19 1450 .  tamsulosin (FLOMAX) capsule 0.4 mg, 0.4 mg, Oral, QPC  supper, Florina Ou V, MD, 0.4 mg at 04/09/19 1655    ALLERGIES   Penicillins and Lidocaine     REVIEW OF SYSTEMS    Review of Systems:  Gen:  Denies  fever, sweats, chills weigh loss  HEENT: Denies blurred vision, double vision, ear pain, eye pain, hearing loss, nose bleeds, sore throat Cardiac:  No dizziness, chest pain or heaviness, chest tightness,edema Resp:   Denies cough or sputum porduction, shortness of breath,wheezing, hemoptysis,  Gi: Denies swallowing difficulty, stomach pain, nausea or vomiting, diarrhea, constipation, bowel incontinence Gu:  Denies bladder incontinence, burning urine Ext:   Denies Joint pain, stiffness or swelling Skin: Denies  skin rash, easy bruising or bleeding or hives Endoc:  Denies polyuria, polydipsia , polyphagia or weight change Psych:   Denies depression, insomnia or hallucinations   Other:  All other systems negative   VS: BP 104/75 (BP Location: Right Arm)   Pulse 77   Temp 97.9 F (36.6 C) (Oral)   Resp 18   Ht _0  (1.651 m)   Wt 54.3 kg   SpO2 96%   BMI 19.94 kg/m      PHYSICAL EXAM    GENERAL:NAD, no fevers, chills, no weakness no fatigue HEAD: Normocephalic, atraumatic.  EYES: Pupils equal, round, reactive to light. Extraocular muscles intact. No scleral icterus.  MOUTH: Moist mucosal membrane. Dentition intact. No abscess noted.  EAR, NOSE, THROAT: Clear without exudates. No external lesions.  NECK: Supple. No thyromegaly. No nodules. No JVD.  PULMONARY: Bilateral rhonchorous breath sounds worse on the right and crackles at the right. CARDIOVASCULAR: S1 and S2. Regular rate and rhythm. No murmurs, rubs, or gallops. No edema. Pedal pulses 2+ bilaterally.  GASTROINTESTINAL: Soft, nontender, nondistended. No masses. Positive bowel sounds. No hepatosplenomegaly.  MUSCULOSKELETAL: No swelling, clubbing, or edema. Range of motion full in all extremities.  NEUROLOGIC: Cranial nerves II through XII are intact. No gross  focal neurological deficits. Sensation intact. Reflexes intact.  SKIN: No ulceration, lesions, rashes, or cyanosis. Skin warm and dry. Turgor intact.  PSYCHIATRIC: Mood, affect within normal limits. The patient is awake, alert and oriented x 3. Insight, judgment intact.       IMAGING  DG Chest 2 View  Result Date: 04/07/2019 CLINICAL DATA:  Chest pain and shortness of breath EXAM: CHEST - 2 VIEW COMPARISON:  March 13, 2019 chest radiograph and chest CT February 13, 2019 FINDINGS: There remains consolidation with volume loss in the right upper lobe with slightly improved aeration in this area compared to prior studies. Elsewhere, there is extensive underlying emphysematous change with fibrosis. Fibrosis is most severe in the right base region. No new opacity evident. Heart size and pulmonary vascularity are normal. No adenopathy is evident. There is aortic atherosclerosis. No bone lesions. There is calcification in each carotid artery. IMPRESSION: Underlying emphysematous change in fibrosis. Consolidation in the right upper lobe is slightly less pronounced than on recent prior studies. No new opacity evident. Stable cardiac silhouette. No adenopathy demonstrable by radiography. Aortic Atherosclerosis (ICD10-I70.0). Electronically Signed   By: Lowella Grip III M.D.   On: 04/07/2019 11:20   CT Angio Chest PE W/Cm &/Or Wo Cm  Result Date: 04/07/2019 CLINICAL DATA:  Shortness of breath. EXAM: CT ANGIOGRAPHY CHEST WITH CONTRAST TECHNIQUE: Multidetector CT imaging of the chest was performed using the standard protocol during bolus administration of intravenous contrast. Multiplanar CT image reconstructions and MIPs were obtained to evaluate the vascular anatomy. CONTRAST:  41m OMNIPAQUE IOHEXOL 350 MG/ML SOLN COMPARISON:  Chest radiograph April 07, 2019; chest CT February 13, 2019 FINDINGS: Cardiovascular: There is no demonstrable pulmonary embolus. Ascending thoracic aortic diameter measures 4.6  x 4.5 cm. No dissection is appreciable. The contrast bolus in the aorta is somewhat less than optimal for dissection assessment. There is localized aneurysmal dilatation in the distal descending thoracic aorta at the diaphragmatic hiatus with localized plaque and calcification along the leftward aspect of this aneurysm. The descending thoracic aorta at the level of the diaphragmatic hiatus measures 3.8 x 3.1 cm. Appearance of this aneurysm is similar to most recent CT. There is aortic atherosclerosis as well as scattered foci of calcification in proximal visualized great vessels. There are foci of coronary artery calcification. There is left ventricular hypertrophy. No pericardial effusion or pericardial thickening. Mediastinum/Nodes: Thyroid appears unremarkable. There are mildly prominent right hilar lymph nodes, largest measuring 1.5 x 1.3 cm. Subcarinal lymph node measures 1.4 x 1.2 cm. No esophageal lesions are evident. Lungs/Pleura: There is a moderate right pleural effusion with a much smaller left pleural effusion. There is extensive underlying emphysematous change with fibrosis in the lower lung regions, significantly more on the right than on the left. There are multiple areas of cicatrization throughout the lungs bilaterally, more severe on the right than on the left. There is consolidation in the anterior segment right upper lobe with air bronchograms and volume loss. No similar consolidation elsewhere. Upper Abdomen: There is upper abdominal aortic atherosclerosis. Visualized upper abdominal structures otherwise appear unremarkable. Musculoskeletal: Anterior wedging of a midthoracic vertebral body is stable. No blastic or lytic bone lesions. No evident chest wall lesions. Review of the MIP images confirms the above findings. IMPRESSION: 1.  No demonstrable pulmonary embolus. 2. Prominence of the ascending thoracic aorta with a measured diameter of 4.6 x 4.5 cm. Ascending thoracic aortic aneurysm.  Recommend semi-annual imaging followup by CTA or MRA and referral to cardiothoracic surgery if not already obtained. This recommendation follows 2010 ACCF/AHA/AATS/ACR/ASA/SCA/SCAI/SIR/STS/SVM Guidelines for the Diagnosis and Management of Patients With Thoracic Aortic Disease. Circulation. 2010; 121:: G536-I680 Aortic aneurysm NOS (ICD10-I71.9). No dissection evident with contrast bolus somewhat less than optimal for dissection assessment. 3. Localized aneurysmal dilatation of the descending thoracic  aorta at the diaphragmatic hiatus level measuring 3.8 x 3.1 cm. Localized plaque and dilatation along the leftward aspect of the aneurysm, also present previously. Note that there is aortic atherosclerosis as well as foci of great vessel and coronary artery calcification. 4. 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 Pleural effusions bilaterally, larger on the right than on the left. Persistent consolidation in the right upper lobe anteriorly with air bronchograms. Appearance most consistent with pneumonia with possibility of superimposed neoplasm not excluded anteriorly toward the apex on the right. 5.  Prominent right hilar and subcarinal lymph nodes. Aortic Atherosclerosis (ICD10-I70.0) and Emphysema (ICD10-J43.9). Aortic aneurysm NOS (ICD10-I71.9). Electronically Signed   By: Lowella Grip III M.D.   On: 04/07/2019 13:17   DG Chest Port 1 View  Result Date: 04/08/2019 CLINICAL DATA:  Post right-sided thoracentesis. EXAM: PORTABLE CHEST 1 VIEW COMPARISON:  04/07/2019; chest CT-04/07/2019 FINDINGS: Grossly unchanged cardiac silhouette and mediastinal contours with persistent obscuration of the right heart border and mediastinal stripe secondary to worsening right upper lobe heterogeneous opacities and associated volume loss. Worsening right mid and bilateral lower lung interstitial thickening. Interval reduction/resolution of right-sided pleural effusion post  thoracentesis. No pneumothorax. No acute osseous abnormalities. IMPRESSION: 1. Interval reduction/resolution of right-sided pleural effusion post thoracentesis. No pneumothorax. 2. Worsening right heterogeneous/consolidative opacities and associated volume loss. 3. Worsening right mid bilateral lower lung interstitial thickening with broad differential considerations including pulmonary edema versus progression of multifocal infection, including atypical etiologies. Electronically Signed   By: Sandi Mariscal M.D.   On: 04/08/2019 17:31   DG Chest Port 1 View  Result Date: 03/13/2019 CLINICAL DATA:  Status post bronchoscopy EXAM: PORTABLE CHEST 1 VIEW COMPARISON:  12/23/2018 FINDINGS: The heart size and mediastinal contours are within normal limits. Redemonstrated masslike consolidation of the anterior right upper lobe. Emphysema. The visualized skeletal structures are unremarkable. IMPRESSION: 1. Redemonstrated masslike consolidation of the anterior right upper lobe. No acute appearing airspace opacity. 2.  Emphysema. Electronically Signed   By: Eddie Candle M.D.   On: 03/13/2019 16:17   DG C-Arm 1-60 Min-No Report  Result Date: 03/13/2019 Fluoroscopy was utilized by the requesting physician.  No radiographic interpretation.   ECHOCARDIOGRAM COMPLETE  Result Date: 04/08/2019    ECHOCARDIOGRAM REPORT   Patient Name:   Joseph Peters Date of Exam: 04/07/2019 Medical Rec #:  [id number]        Height:       65.0 in Accession #:    [id number]       Weight:       120.6 lb Date of Birth:  06-19-1955        BSA:          1.60 m Patient Age:    3 years         BP:           121/92 mmHg Patient Gender: M                HR:           96 bpm. Exam Location:  [facility] Procedure: 2D Echo, Cardiac Doppler and Color Doppler Indications:     Elevated Troponin  History:         Patient has no prior history of Echocardiogram examinations.                  Risk Factors:Dyslipidemia and Hypertension. Stroke. Pneumonia.  Myocardial Infarction. Dyspnea. Coronary artery disease. COPD.                  Chest pain. AAA.  Sonographer:     Wilford Sports Rodgers-Jones Referring Phys:  [place] Diagnosing Phys: Isaias Cowman MD IMPRESSIONS  1. Left ventricular ejection fraction, by estimation, is 35 to 40%. The left ventricle has moderately decreased function. The left ventrical has no regional wall motion abnormalities. The left ventricular internal cavity size was mildly to moderately dilated. Left ventricular diastolic parameters are indeterminate.  2. Right ventricular systolic function is normal. The right ventricular size is normal.  3. No left atrial/left atrial appendage thrombus was detected.  4. The mitral valve is normal in structure and function. Mild mitral valve regurgitation. No evidence of mitral stenosis.  5. The aortic valve is normal in structure and function. Aortic valve regurgitation is not visualized. Mild to moderate aortic valve stenosis.  6. The inferior vena cava is normal in size with greater than 50% respiratory variability, suggesting right atrial pressure of 3 mmHg. FINDINGS  Left Ventricle: Left ventricular ejection fraction, by estimation, is 35 to 40%. The left ventricle has moderately decreased function. The left ventricle has no regional wall motion abnormalities. The left ventricular internal cavity size was mildly to moderately dilated. There is no. There is no left ventricular hypertrophy. Left ventricular diastolic parameters are indeterminate.  LV Wall Scoring: The basal inferolateral segment, mid inferoseptal segment, apical septal segment, basal inferior segment, and apex are hypokinetic. Right Ventricle: The right ventricular size is normal. No increase in right ventricular wall thickness. Right ventricular systolic function is normal. Left Atrium: Left atrial size was normal in size. Right Atrium: Right atrial size was normal in size. Pericardium: There is no evidence of  pericardial effusion. Mitral Valve: The mitral valve is normal in structure and function. Normal mobility of the mitral valve leaflets. Mild mitral valve regurgitation. No evidence of mitral valve stenosis. Tricuspid Valve: The tricuspid valve is normal in structure. Tricuspid valve regurgitation is mild . No evidence of tricuspid stenosis. Aortic Valve: The aortic valve is normal in structure and function. Aortic valve regurgitation is not visualized. Mild to moderate aortic stenosis is present. Aortic valve mean gradient measures 6.7 mmHg. Aortic valve peak gradient measures 9.2 mmHg. Aortic valve area, by VTI measures 0.96 cm. Pulmonic Valve: The pulmonic valve was normal in structure. Pulmonic valve regurgitation is not visualized. No evidence of pulmonic stenosis. Aorta: The aortic root is normal in size and structure. Venous: The inferior vena cava is normal in size with greater than 50% respiratory variability, suggesting right atrial pressure of 3 mmHg. The inferior vena cava and the hepatic vein show a normal flow pattern. IAS/Shunts: No atrial level shunt detected by color flow Doppler.  LEFT VENTRICLE PLAX 2D LVIDd:         5.61 cm  Diastology LVIDs:         4.21 cm  LV e' lateral:   4.35 cm/s LV PW:         0.94 cm  LV E/e' lateral: 13.5 LV IVS:        0.68 cm  LV e' medial:    5.00 cm/s LVOT diam:     2.20 cm  LV E/e' medial:  11.7 LV SV:         23.64 ml LV SV Index:   47.63 LVOT Area:     3.80 cm  RIGHT VENTRICLE RV Basal diam:  3.68 cm RV  S prime:     10.40 cm/s TAPSE (M-mode): 1.8 cm LEFT ATRIUM           Index       RIGHT ATRIUM           Index LA diam:      4.20 cm 2.63 cm/m  RA Area:     13.50 cm LA Vol (A2C): 40.0 ml 25.07 ml/m RA Volume:   39.70 ml  24.88 ml/m LA Vol (A4C): 17.9 ml 11.22 ml/m  AORTIC VALVE AV Area (Vmax):    0.94 cm AV Area (Vmean):   0.87 cm AV Area (VTI):     0.96 cm AV Vmax:           152.00 cm/s AV Vmean:          122.333 cm/s AV VTI:            0.246 m AV Peak  Grad:      9.2 mmHg AV Mean Grad:      6.7 mmHg LVOT Vmax:         37.45 cm/s LVOT Vmean:        27.900 cm/s LVOT VTI:          0.062 m LVOT/AV VTI ratio: 0.25  AORTA Ao Root diam: 3.60 cm MITRAL VALVE MV Area (PHT): 7.66 cm             SHUNTS MV Decel Time: 99 msec              Systemic VTI:  0.06 m MV E velocity: 58.70 cm/s 103 cm/s  Systemic Diam: 2.20 cm MV A velocity: 52.20 cm/s 70.3 cm/s MV E/A ratio:  1.12       1.5 Isaias Cowman MD Electronically signed by Isaias Cowman MD Signature Date/Time: 04/08/2019/1:00:24 PM    Final    US THORACENTESIS ASP PLEURAL SPACE W/IMG GUIDE  Addendum Date: 04/08/2019   ADDENDUM REPORT: 04/08/2019 17:38 ADDENDUM: Note, Nesacaine was utilized for subcutaneous anesthesia purposes given history of lidocaine allergy. Patient tolerated this medication without incident. Electronically Signed   By: Sandi Mariscal M.D.   On: 04/08/2019 17:38   Result Date: 04/08/2019 INDICATION: Symptomatic right-sided sided pleural effusion EXAM: US THORACENTESIS ASP PLEURAL SPACE W/IMG GUIDE COMPARISON:  Chest radiograph-04/07/2019; chest CT-04/06/2018 MEDICATIONS: None. COMPLICATIONS: None immediate. TECHNIQUE: Informed written consent was obtained from the patient after a discussion of the risks, benefits and alternatives to treatment. A timeout was performed prior to the initiation of the procedure. Initial ultrasound scanning demonstrates a small anechoic right-sided pleural effusion. The lower chest was prepped and draped in the usual sterile fashion. 1% lidocaine was used for local anesthesia. An ultrasound image was saved for documentation purposes. An 8 Fr Safe-T-Centesis catheter was introduced under ultrasound guidance. The thoracentesis was performed. The catheter was removed and a dressing was applied. The patient tolerated the procedure well without immediate post procedural complication. The patient was escorted to have an upright chest radiograph. FINDINGS: A total of  approximately 450 cc of serous fluid was removed. Requested samples were sent to the laboratory. IMPRESSION: Successful ultrasound-guided right sided thoracentesis yielding 450 cc of pleural fluid. Electronically Signed: By: Sandi Mariscal M.D. On: 04/08/2019 17:33      ASSESSMENT/PLAN   Moderate Acute exacerbation of COPD  -Continue DuoNebs every 6 hours  -COPD care path -Budesonide 0.5 mg nebulized twice daily -Solu-Medrol 40 twice daily IV -Aggressive bronchopulmonary hygiene protocol -MetaNeb therapy twice daily with respiratory therapist-adding hypersal -Incentive  spirometer and Acapella flutter valve -Physical and Occupational Therapy    Mycobacterium Avium Complex -Patient has been started on Zithromax, rifampin, ethambutol -He is being followed by infectious disease, will defer antibiotics to Dr. Delaine Lame    Moderate right pleural effusion   -Contributing to hypoxemia -ultrasound-guided thoracentesis -Pleural fluid studies with ADA, AFB, pH, glucose, cytology, LDH, albumin, protein triglycerides,     Thank you for allowing me to participate in the care of this patient.   Patient/Family are satisfied with care plan and all questions have been answered.  This document was prepared using Dragon voice recognition software and may include unintentional dictation errors.     Ottie Glazier, M.D.  Division of [facility]

## 2019-04-10 NOTE — Care Management Important Message (Signed)
Important Message  Patient Details  Name: Joseph Peters MRN: [id number] Date of Birth: 02-11-1956   Medicare Important Message Given:  Yes     Dannette Barbara 04/10/2019, 1:31 PM

## 2019-04-10 NOTE — Progress Notes (Signed)
Pt suffers from Bronchiectasis. Pt has had a daily productive cough for greater than 6 continuous months. Pt continues to have frequent exacerbations/chest infections requiring antibiotic therapy more than twice a year. Pt has been tried and failed on Flutter Valve and Other Breathing/Drainage Techniques with no success. Pt can benefit from Afflovest Therapy to minimize exacerbations.

## 2019-04-10 NOTE — Progress Notes (Signed)
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 ID following and recs apprec    AKI: baseline Cr/GFR is unknown. Possibly secondary to abx use. Will continue to monitor.   COPD: severe. Continue on azithromycin. Continue on supplemental oxygen and wean as tolerated. Continue on bronchodilators. Pulmon following and recs apprec  Right pleural effusion: s/p right pleural thoracentesis on 04/08/19 w/ fluid studies pending. Pulmon recs apprec   Acute hypoxic respiratory failure: likely multifactorial, secondary to above including but not limited to MAC & severe COPD. Continue on supplemental oxygen and wean as tolerated. Dropped to 88% w/ ambulation and will need to be d/c w/ supplemental oxygen. Discussed w/ CM   Elevated troponins: r/o NSTEMI. Continue on metoprolol. Continue on tele. Cardio signed off. F/u w/ Dr. Ubaldo Glassing in 1 week from d/c  HTN: continue on losartan & metoprolol  HLD: continue on statin  Thoracic aortic aneurysm: CTA chest is neg for PE but shows and ascending thoracic aortic aneurysm 4.5 x 4.6 cm and also aneurysmal dilatation of descending thoracic aorta without evidence of dissection. Vascular surg previously knew about aneurysm unable to repair until MAC infection is completely resolved as a high mortality rate w/ infection of graft as per vascular surg   Normocytic anemia: no need for a transfusion at this time. Will continue to monitor   Smoker: pt refuses nicotine patch. Smoking cessation counseling    DVT prophylaxis: lovenox  Code Status: full  Family Communication:  Disposition Plan: will likely need a couple  more days for pt to clinically improve   Consultants:   Cardio  ID  Pulmonary  Vascular surgery     Procedures:    Antimicrobials: azithromycin, rifampin, ethambutol, amikacin   Subjective: Pt c/o fatigue  Objective: Vitals:   04/09/19 1933 04/09/19 2004 04/10/19 0443 04/10/19 0804  BP: 99/77  106/78 109/75  Pulse: 73  77 75  Resp: _0 Temp: 97.9 F (36.6 C)  97.8 F (36.6 C) 98.2 F (36.8 C)  TempSrc: Oral  Oral   SpO2: 95% 94% 93% 97%  Weight:      Height:        Intake/Output Summary (Last 24 hours) at 04/10/2019 0806 Last data filed at 04/09/2019 1315 Gross per 24 hour  Intake 603 ml  Output --  Net 603 ml   Filed Weights   04/07/19 1040 04/07/19 1537 04/08/19 0427  Weight: 45.8 kg 54.7 kg 54.3 kg    Examination:  General exam: Appears calm and comfortable. Appears older than stated age  Respiratory system: diminished breath sounds b/l. No wheezes Cardiovascular system: S1 & S2 +. No rubs, gallops or clicks.  Gastrointestinal system: Abdomen is nondistended, soft and nontender. Normal bowel sounds heard. Central nervous system: Alert and oriented. Moves all 4 extremities  Psychiatry: Judgement and insight appear normal. flat mood and affect     Data Reviewed: I have personally reviewed following labs and imaging studies  CBC: Recent Labs  Lab 04/07/19 1131 04/08/19 0203 04/09/19 0444 04/10/19 0647  WBC 9.6 8.0 3.8* 9.5  HGB 13.2 10.8* 11.5* 12.1*  HCT 39.3 32.3* 35.2*  36.1*  MCV 88.1 89.5 90.3 89.6  PLT 238 190 224 956   Basic Metabolic Panel: Recent Labs  Lab 04/07/19 1131 04/09/19 0444 04/10/19 0647  NA 134* 133* 134*  K 4.0 4.8 4.5  CL 97* 101 104  CO2 24 21* 22  GLUCOSE 104* 115* 105*  BUN 15 20 24*  CREATININE 1.18 1.16 1.34*  CALCIUM 8.7* 8.2* 8.6*   GFR: Estimated Creatinine Clearance: 43.9 mL/min (A) (by C-G formula based on SCr of 1.34 mg/dL (H)). Liver Function Tests: Recent Labs  Lab 04/09/19 0444  04/10/19 0647  AST 38 29  ALT 21 25  ALKPHOS 99 96  BILITOT 0.9 0.8  PROT 6.0* 5.9*  ALBUMIN 2.8* 2.5*   No results for input(s): LIPASE, AMYLASE in the last 168 hours. No results for input(s): AMMONIA in the last 168 hours. Coagulation Profile: Recent Labs  Lab 04/07/19 1131  INR 1.1   Cardiac Enzymes: No results for input(s): CKTOTAL, CKMB, CKMBINDEX, TROPONINI in the last 168 hours. BNP (last 3 results) No results for input(s): PROBNP in the last 8760 hours. HbA1C: No results for input(s): HGBA1C in the last 72 hours. CBG: No results for input(s): GLUCAP in the last 168 hours. Lipid Profile: No results for input(s): CHOL, HDL, LDLCALC, TRIG, CHOLHDL, LDLDIRECT in the last 72 hours. Thyroid Function Tests: Recent Labs    04/07/19 1326  TSH 2.292  FREET4 1.17*   Anemia Panel: No results for input(s): VITAMINB12, FOLATE, FERRITIN, TIBC, IRON, RETICCTPCT in the last 72 hours. Sepsis Labs: No results for input(s): PROCALCITON, LATICACIDVEN in the last 168 hours.  Recent Results (from the past 240 hour(s))  Respiratory Panel by RT PCR (Flu A&B, Covid) - Nasopharyngeal Swab     Status: None   Collection Time: 04/07/19 11:36 AM   Specimen: Nasopharyngeal Swab  Result Value Ref Range Status   SARS Coronavirus 2 by RT PCR NEGATIVE NEGATIVE Final    Comment: (NOTE) SARS-CoV-2 target nucleic acids are NOT DETECTED. The SARS-CoV-2 RNA is generally detectable in upper respiratoy specimens during the acute phase of infection. The lowest concentration of SARS-CoV-2 viral copies this assay can detect is 131 copies/mL. A negative result does not preclude SARS-Cov-2 infection and should not be used as the sole basis for treatment or other patient management decisions. A negative result may occur with  improper specimen collection/handling, submission of specimen other than nasopharyngeal swab, presence of viral mutation(s) within the areas targeted by this assay, and inadequate  number of viral copies (<131 copies/mL). A negative result must be combined with clinical observations, patient history, and epidemiological information. The expected result is Negative. Fact Sheet for Patients:  PinkCheek.be Fact Sheet for Healthcare Providers:  GravelBags.it This test is not yet ap proved or cleared by the [place] FDA and  has been authorized for detection and/or diagnosis of SARS-CoV-2 by FDA under an Emergency Use Authorization (EUA). This EUA will remain  in effect (meaning this test can be used) for the duration of the COVID-19 declaration under Section 564(b)(1) of the Act, 21 U.S.C. section 360bbb-3(b)(1), unless the authorization is terminated or revoked sooner.    Influenza A by PCR NEGATIVE NEGATIVE Final   Influenza B by PCR NEGATIVE NEGATIVE Final    Comment: (NOTE) The Xpert Xpress SARS-CoV-2/FLU/RSV assay is intended as an aid in  the diagnosis of influenza from Nasopharyngeal swab specimens and  should not be used as a sole basis for treatment. Nasal washings and  aspirates are unacceptable for  Xpert Xpress SARS-CoV-2/FLU/RSV  testing. Fact Sheet for Patients: PinkCheek.be Fact Sheet for Healthcare Providers: GravelBags.it This test is not yet approved or cleared by the [place] FDA and  has been authorized for detection and/or diagnosis of SARS-CoV-2 by  FDA under an Emergency Use Authorization (EUA). This EUA will remain  in effect (meaning this test can be used) for the duration of the  Covid-19 declaration under Section 564(b)(1) of the Act, 21  U.S.C. section 360bbb-3(b)(1), unless the authorization is  terminated or revoked. Performed at [facility], [address] [place], [place] [address]   Body fluid culture     Status: None (Preliminary result)   Collection Time: 04/08/19  4:58 PM   Specimen: PATH  Cytology Pleural fluid  Result Value Ref Range Status   Specimen Description PLEURAL  Final   Special Requests   Final    CYTO PLEU Performed at [facility], [address] [place], [place] [address]    Gram Stain NO WBC SEEN NO ORGANISMS SEEN   Final   Culture PENDING  Incomplete   Report Status PENDING  Incomplete         Radiology Studies: DG Chest Port 1 View  Result Date: 04/08/2019 CLINICAL DATA:  Post right-sided thoracentesis. EXAM: PORTABLE CHEST 1 VIEW COMPARISON:  04/07/2019; chest CT-04/07/2019 FINDINGS: Grossly unchanged cardiac silhouette and mediastinal contours with persistent obscuration of the right heart border and mediastinal stripe secondary to worsening right upper lobe heterogeneous opacities and associated volume loss. Worsening right mid and bilateral lower lung interstitial thickening. Interval reduction/resolution of right-sided pleural effusion post thoracentesis. No pneumothorax. No acute osseous abnormalities. IMPRESSION: 1. Interval reduction/resolution of right-sided pleural effusion post thoracentesis. No pneumothorax. 2. Worsening right heterogeneous/consolidative opacities and associated volume loss. 3. Worsening right mid bilateral lower lung interstitial thickening with broad differential considerations including pulmonary edema versus progression of multifocal infection, including atypical etiologies. Electronically Signed   By: Sandi Mariscal M.D.   On: 04/08/2019 17:31   US THORACENTESIS ASP PLEURAL SPACE W/IMG GUIDE  Addendum Date: 04/08/2019   ADDENDUM REPORT: 04/08/2019 17:38 ADDENDUM: Note, Nesacaine was utilized for subcutaneous anesthesia purposes given history of lidocaine allergy. Patient tolerated this medication without incident. Electronically Signed   By: Sandi Mariscal M.D.   On: 04/08/2019 17:38   Result Date: 04/08/2019 INDICATION: Symptomatic right-sided sided pleural effusion EXAM: US THORACENTESIS ASP PLEURAL SPACE W/IMG GUIDE  COMPARISON:  Chest radiograph-04/07/2019; chest CT-04/06/2018 MEDICATIONS: None. COMPLICATIONS: None immediate. TECHNIQUE: Informed written consent was obtained from the patient after a discussion of the risks, benefits and alternatives to treatment. A timeout was performed prior to the initiation of the procedure. Initial ultrasound scanning demonstrates a small anechoic right-sided pleural effusion. The lower chest was prepped and draped in the usual sterile fashion. 1% lidocaine was used for local anesthesia. An ultrasound image was saved for documentation purposes. An 8 Fr Safe-T-Centesis catheter was introduced under ultrasound guidance. The thoracentesis was performed. The catheter was removed and a dressing was applied. The patient tolerated the procedure well without immediate post procedural complication. The patient was escorted to have an upright chest radiograph. FINDINGS: A total of approximately 450 cc of serous fluid was removed. Requested samples were sent to the laboratory. IMPRESSION: Successful ultrasound-guided right sided thoracentesis yielding 450 cc of pleural fluid. Electronically Signed: By: Sandi Mariscal M.D. On: 04/08/2019 17:33        Scheduled Meds: . albuterol  2 puff Inhalation Once  . aspirin EC  81 mg Oral Daily  . azithromycin  500 mg Oral Daily  . budesonide (PULMICORT) nebulizer solution  0.5 mg Nebulization BID  . chloroprocaine (PF)  10 mL Other Once  . enoxaparin (LOVENOX) injection  40 mg Subcutaneous Q24H  . ethambutol  800 mg Oral Daily  . finasteride  5 mg Oral Daily  . FLUoxetine  20 mg Oral Daily  . gabapentin  900 mg Oral QHS  . ipratropium-albuterol  3 mL Nebulization TID  . losartan  25 mg Oral Daily  . methylPREDNISolone (SOLU-MEDROL) injection  40 mg Intravenous Q12H  . metoprolol succinate  25 mg Oral Daily  . ondansetron  4 mg Oral Q24H  . rifampin  600 mg Oral Daily  . rosuvastatin  20 mg Oral q1800  . sodium chloride flush  3 mL Intravenous  Q12H  . sodium chloride HYPERTONIC  4 mL Nebulization Q24H  . tamsulosin  0.4 mg Oral QPC supper   Continuous Infusions: . sodium chloride 250 mL (04/08/19 2008)  . amikacin (AMIKIN) IV 600 mg (04/09/19 2053)     LOS: 3 days    Time spent: 30 mins     Wyvonnia Dusky, MD [facility] Pager [phone]  If 7PM-7AM, please contact night-coverage [email or web address] 04/10/2019, 8:06 AM

## 2019-04-10 NOTE — Plan of Care (Signed)
  Problem: Pain Managment: Goal: General experience of comfort will improve Outcome: Progressing   Problem: Education: Goal: Knowledge of General Education information will improve Description: Including pain rating scale, medication(s)/side effects and non-pharmacologic comfort measures Outcome: Progressing   

## 2019-04-10 NOTE — Progress Notes (Signed)
Physical Therapy Treatment Patient Details Name: Joseph Peters MRN: [id number] DOB: 06-27-1955 Today's Date: 04/10/2019    History of Present Illness Per MD notes: Pt is a 64 y.o. male with past medical history of CAD, COPD, hypertension, AAA, pulmonary fibrosis presents to the ED complaining of shortness of breath. MD assessment includes NSTEMI, HTN, AAA, COPD exacerbation, GERD, R Pleural effusion s/p theracentesis, thoracic aortic aneurysm, normocytic anemia, acute hypoxic respiratory failure: likely multifactorial, secondary to above including but not limited to MAC & severe COPD.    PT Comments    Pt pleasant and motivated to participate during the session.  Pt found on 1LO2/min with SpO2 remaining 88-91% for the majority of the session and dropping below 88% only after amb with a low reading of 86% noted.  Pt's SpO2 quickly increased back to 90-91% after sitting and with cues for PLB.  Pt steady with amb and demonstrated improved functional strength during bed mobility and transfers.  Pt will benefit from HHPT services upon discharge to safely address deficits listed in patient problem list for decreased caregiver assistance and decreased risk of further functional decline.     Follow Up Recommendations  Home health PT     Equipment Recommendations  None recommended by PT    Recommendations for Other Services       Precautions / Restrictions Precautions Precautions: Fall Restrictions Weight Bearing Restrictions: No    Mobility  Bed Mobility Overal bed mobility: Independent             General bed mobility comments: Good speed and effort with bed mobility tasks this session  Transfers Overall transfer level: Needs assistance Equipment used: Rolling walker (2 wheeled) Transfers: Sit to/from Stand Sit to Stand: Supervision         General transfer comment: Min verbal cues for hand placement  Ambulation/Gait Ambulation/Gait assistance: Supervision Gait  Distance (Feet): 80 Feet Assistive device: Rolling walker (2 wheeled) Gait Pattern/deviations: Step-to pattern;Step-through pattern;Decreased step length - right Gait velocity: decreased   General Gait Details: Good stability during gait including during 180 deg turns with SpO2 dropping to a low of 86% after amb and returning to 90-91% after sitting 30 sec with cues for PLB   Stairs             Wheelchair Mobility    Modified Rankin (Stroke Patients Only)       Balance Overall balance assessment: Mild deficits observed, not formally tested                                          Cognition Arousal/Alertness: Awake/alert Behavior During Therapy: WFL for tasks assessed/performed Overall Cognitive Status: Within Functional Limits for tasks assessed                                        Exercises Total Joint Exercises Ankle Circles/Pumps: AROM;Strengthening;Both;15 reps Quad Sets: AROM;Strengthening;Both;15 reps Gluteal Sets: AROM;Strengthening;Both;15 reps Towel Squeeze: Strengthening;Both;10 reps Hip ABduction/ADduction: Strengthening;Both;10 reps Long Arc Quad: AROM;Strengthening;Both;10 reps Marching in Standing: AROM;Strengthening;Both;10 reps Other Exercises Other Exercises: PLB training Other Exercises: Pt education provided on physiological benefits of activity and principles of gradual activity progression    General Comments        Pertinent Vitals/Pain Pain Assessment: No/denies pain    Home Living  Prior Function            PT Goals (current goals can now be found in the care plan section) Progress towards PT goals: Progressing toward goals    Frequency    Min 2X/week      PT Plan Current plan remains appropriate    Co-evaluation              AM-PAC PT "6 Clicks" Mobility   Outcome Measure  Help needed turning from your back to your side while in a flat bed  without using bedrails?: A Little Help needed moving from lying on your back to sitting on the side of a flat bed without using bedrails?: A Little Help needed moving to and from a bed to a chair (including a wheelchair)?: A Little Help needed standing up from a chair using your arms (e.g., wheelchair or bedside chair)?: A Little Help needed to walk in hospital room?: A Little Help needed climbing 3-5 steps with a railing? : A Little 6 Click Score: 18    End of Session Equipment Utilized During Treatment: Gait belt;Oxygen Activity Tolerance: Patient tolerated treatment well Patient left: in chair;with call bell/phone within reach;with chair alarm set Nurse Communication: Mobility status PT Visit Diagnosis: Unsteadiness on feet (R26.81);Muscle weakness (generalized) (M62.81);History of falling (Z91.81)     Time: 2778-2423 PT Time Calculation (min) (ACUTE ONLY): 30 min  Charges:  $Gait Training: 8-22 mins $Therapeutic Exercise: 8-22 mins                     D. Scott Claryssa Sandner PT, DPT 04/10/19, 5:40 PM

## 2019-04-11 LAB — COMPREHENSIVE METABOLIC PANEL
ALT: 28 U/L (ref 0–44)
AST: 26 U/L (ref 15–41)
Albumin: 2.7 g/dL — ABNORMAL LOW (ref 3.5–5.0)
Alkaline Phosphatase: 99 U/L (ref 38–126)
Anion gap: 9 (ref 5–15)
BUN: 24 mg/dL — ABNORMAL HIGH (ref 8–23)
CO2: 22 mmol/L (ref 22–32)
Calcium: 8.2 mg/dL — ABNORMAL LOW (ref 8.9–10.3)
Chloride: 104 mmol/L (ref 98–111)
Creatinine, Ser: 1.25 mg/dL — ABNORMAL HIGH (ref 0.61–1.24)
GFR calc Af Amer: >60 mL/min (ref >60)
GFR calc non Af Amer: >60 mL/min (ref >60)
Glucose, Bld: 117 mg/dL — ABNORMAL HIGH (ref 70–99)
Potassium: 4.7 mmol/L (ref 3.5–5.1)
Sodium: 135 mmol/L (ref 135–145)
Total Bilirubin: 0.4 mg/dL (ref 0.3–1.2)
Total Protein: 5.9 g/dL — ABNORMAL LOW (ref 6.5–8.1)

## 2019-04-11 LAB — CBC
HCT: 34.8 % — ABNORMAL LOW (ref 39.0–52.0)
Hemoglobin: 11.5 g/dL — ABNORMAL LOW (ref 13.0–17.0)
MCH: 29.7 pg (ref 26.0–34.0)
MCHC: 33 g/dL (ref 30.0–36.0)
MCV: 89.9 fL (ref 80.0–100.0)
Platelets: 237 10*3/uL (ref 150–400)
RBC: 3.87 MIL/uL — ABNORMAL LOW (ref 4.22–5.81)
RDW: 14.3 % (ref 11.5–15.5)
WBC: 6.8 10*3/uL (ref 4.0–10.5)
nRBC: 0 % (ref 0.0–0.2)

## 2019-04-11 LAB — FUNGITELL, SERUM: Fungitell Result: <31 pg/mL (ref <80)

## 2019-04-11 MED ORDER — ETHAMBUTOL HCL 400 MG PO TABS: 800.0000 mg | ORAL_TABLET | Freq: Every day | ORAL | 0 refills | Status: DC

## 2019-04-11 MED ORDER — PREDNISONE 20 MG PO TABS: 40.0000 mg | ORAL_TABLET | Freq: Every day | ORAL | 0 refills | Status: DC

## 2019-04-11 MED ORDER — AZITHROMYCIN 500 MG PO TABS: 500.0000 mg | ORAL_TABLET | Freq: Every day | ORAL | 0 refills | Status: DC

## 2019-04-11 MED ORDER — RIFAMPIN 300 MG PO CAPS: 600.0000 mg | ORAL_CAPSULE | Freq: Every day | ORAL | 0 refills | Status: DC

## 2019-04-11 MED ORDER — METOPROLOL SUCCINATE ER 25 MG PO TB24: 25.0000 mg | ORAL_TABLET | Freq: Every day | ORAL | 0 refills | Status: DC

## 2019-04-11 MED ORDER — BENZONATATE 200 MG PO CAPS: 200.0000 mg | ORAL_CAPSULE | Freq: Three times a day (TID) | ORAL | 0 refills | Status: DC | PRN

## 2019-04-11 NOTE — Progress Notes (Signed)
Pulmonary Medicine          Date: 04/11/2019,   MRN# [id number] [name] [birth year]     AdmissionWeight: 45.8 kg                 CurrentWeight: 54.3 kg   Referring Physician: Dr Jimmye Norman   CHIEF COMPLAINT:   Advanced Emphysema with Mycobacterium Avium Complex    HISTORY OF PRESENT ILLNESS   Patient laying in bed he feels better today.    O2 concentrator has been set up and loaner is in room waiting for patient to take home.    AffloVest therapy with device has been set up for patient - patient does not have to pay anything out of pocket we discussed this today.    He is cleared from pulmonary to be discharged home with close follow up on outpatient basis.   PAST MEDICAL HISTORY   Past Medical History:  Diagnosis Date  . AAA (abdominal aortic aneurysm) without rupture ([place])   . Abdominal aneurysm ([place]) 2015   being followed but not large enough to surgically treat  . Alcoholism ([place])   . Anxiety   . Arthritis   . Asthma   . Benign hypertension 07/16/2014  . Chest pain on exertion 07/16/2014  . Chronic pain syndrome   . Coccydynia 02/08/2012  . COPD (chronic obstructive pulmonary disease) ([place])    patient smokes  . Coronary artery disease   . 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 Depression   . 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 Dyspnea   . Frequent falls   . GERD (gastroesophageal reflux disease)   . Hyperlipidemia   . IBS (irritable bowel syndrome)   . Myocardial infarction ([place]) 2010  . Neuropathy   . Pneumonia   . Prostatitis   . Restless leg syndrome   . Sciatica   . Spinal stenosis of lumbar region   . Stroke ([place]) 2015   no residual symptoms  . Tremor, essential      SURGICAL HISTORY   Past Surgical History:  Procedure Laterality Date  . CARDIAC CATHETERIZATION  2012  . COLONOSCOPY WITH PROPOFOL N/A 11/14/2016   Procedure: COLONOSCOPY WITH PROPOFOL;  Surgeon: Manya Silvas, MD;   Location: [facility] ENDOSCOPY;  Service: Endoscopy;  Laterality: N/A;  . ESOPHAGOGASTRODUODENOSCOPY (EGD) WITH PROPOFOL N/A 11/14/2016   Procedure: ESOPHAGOGASTRODUODENOSCOPY (EGD) WITH PROPOFOL;  Surgeon: Manya Silvas, MD;  Location: [facility] ENDOSCOPY;  Service: Endoscopy;  Laterality: N/A;  . JOINT REPLACEMENT Right    hip  . KNEE SURGERY     right  . SHOULDER ARTHROSCOPY WITH OPEN ROTATOR CUFF REPAIR Right 08/20/2017   Procedure: SHOULDER ARTHROSCOPY WITH OPEN ROTATOR CUFF REPAIR;  Surgeon: Corky Mull, MD;  Location: [facility] ORS;  Service: Orthopedics;  Laterality: Right;  . SHOULDER ARTHROSCOPY WITH SUBACROMIAL DECOMPRESSION, ROTATOR CUFF REPAIR AND BICEP TENDON REPAIR Left 05/10/2016   Procedure: SHOULDER ARTHROSCOPY WITH DEBTRIDEMENT, DECOMPRESSION, REPAIR OF MASSIVE ROTATOR CUFF TEAR AND BICEP TENODESIS;  Surgeon: Corky Mull, MD;  Location: [facility] ORS;  Service: Orthopedics;  Laterality: Left;  Massive cuff tear  . TOTAL HIP ARTHROPLASTY Right 07/01/2018   Procedure: TOTAL HIP ARTHROPLASTY - RIGHT;  Surgeon: Corky Mull, MD;  Location: [facility] ORS;  Service: Orthopedics;  Laterality: Right;  Marland Kitchen VIDEO BRONCHOSCOPY WITH ENDOBRONCHIAL NAVIGATION N/A 02/13/2019   Procedure: VIDEO BRONCHOSCOPY WITH ENDOBRONCHIAL NAVIGATION;  Surgeon: Ottie Glazier, MD;  Location: [facility] ORS;  Service: Thoracic;  Laterality: N/A;  . VIDEO BRONCHOSCOPY WITH ENDOBRONCHIAL NAVIGATION N/A 03/13/2019   Procedure: VIDEO BRONCHOSCOPY WITH ENDOBRONCHIAL NAVIGATION;  Surgeon: Ottie Glazier, MD;  Location: [facility] ORS;  Service: Thoracic;  Laterality: N/A;  . VIDEO BRONCHOSCOPY WITH ENDOBRONCHIAL ULTRASOUND N/A 02/13/2019   Procedure: VIDEO BRONCHOSCOPY WITH ENDOBRONCHIAL ULTRASOUND;  Surgeon: Ottie Glazier, MD;  Location: [facility] ORS;  Service: Thoracic;  Laterality: N/A;  . VIDEO BRONCHOSCOPY WITH ENDOBRONCHIAL ULTRASOUND N/A 03/13/2019   Procedure: VIDEO BRONCHOSCOPY WITH ENDOBRONCHIAL ULTRASOUND;  Surgeon: Ottie Glazier, MD;   Location: [facility] ORS;  Service: Thoracic;  Laterality: N/A;     FAMILY HISTORY   Family History  Problem Relation Age of Onset  . Cancer Mother   . COPD Father 35       black lung     SOCIAL HISTORY   Social History   Tobacco Use  . Smoking status: Light Tobacco Smoker    Packs/day: 0.50    Years: 30.00    Pack years: 15.00    Types: Cigarettes    Start date: 02/26/1973    Last attempt to quit: 09/21/2018    Years since quitting: 0.5  . Smokeless tobacco: Former Systems developer    Quit date: 02/26/1998  . Tobacco comment: smokes 2-3 cig a week 02/12/19  Substance Use Topics  . Alcohol use: Yes    Alcohol/week: 20.0 standard drinks    Types: 20 Cans of beer per week    Comment: week   . Drug use: Yes    Comment: prescribed hydrocodone     MEDICATIONS    Home Medication:    Current Medication:  Current Facility-Administered Medications:  .  0.9 %  sodium chloride infusion, 250 mL, Intravenous, PRN, Para Skeans, MD, Last Rate: 10 mL/hr at 04/08/19 2008, 250 mL at 04/08/19 2008 .  acetaminophen (TYLENOL) tablet 650 mg, 650 mg, Oral, Q4H PRN, Posey Pronto, Ekta V, MD .  albuterol (PROVENTIL) (2.5 MG/3ML) 0.083% nebulizer solution 2.5 mg, 2.5 mg, Nebulization, Q4H PRN, Wyvonnia Dusky, MD .  albuterol (VENTOLIN HFA) 108 (90 Base) MCG/ACT inhaler 2 puff, 2 puff, Inhalation, Once, Blake Divine, MD .  aspirin EC tablet 81 mg, 81 mg, Oral, Daily, Para Skeans, MD, 81 mg at 04/11/19 0849 .  azithromycin (ZITHROMAX) tablet 500 mg, 500 mg, Oral, Daily, Florina Ou V, MD, 500 mg at 04/11/19 0850 .  benzonatate (TESSALON) capsule 200 mg, 200 mg, Oral, TID PRN, Para Skeans, MD, 200 mg at 04/10/19 0848 .  budesonide (PULMICORT) nebulizer solution 0.5 mg, 0.5 mg, Nebulization, BID, Lanney Gins, Tarhonda Hollenberg, MD, 0.5 mg at 04/11/19 0829 .  chloroprocaine (PF) (NESACAINE-MPF) 2 % injection 10 mL, 10 mL, Other, Once, Sandi Mariscal, MD .  enoxaparin (LOVENOX) injection 40 mg, 40 mg, Subcutaneous, Q24H,  Wyvonnia Dusky, MD, 40 mg at 04/10/19 2140 .  ethambutol (MYAMBUTOL) tablet 800 mg, 800 mg, Oral, Daily, Florina Ou V, MD, 800 mg at 04/10/19 0847 .  finasteride (PROSCAR) tablet 5 mg, 5 mg, Oral, Daily, Florina Ou V, MD, 5 mg at 04/11/19 0849 .  FLUoxetine (PROZAC) capsule 20 mg, 20 mg, Oral, Daily, Florina Ou V, MD, 20 mg at 04/11/19 0850 .  gabapentin (NEURONTIN) capsule 900 mg, 900 mg, Oral, QHS, Wyvonnia Dusky, MD, 900 mg at 04/10/19 2140 .  guaiFENesin-dextromethorphan (ROBITUSSIN DM) 100-10 MG/5ML syrup 10 mL, 10 mL, Oral, Q4H PRN, Para Skeans, MD, 10 mL at 04/07/19 2354 .  ipratropium-albuterol (DUONEB) 0.5-2.5 (3) MG/3ML nebulizer solution 3  mL, 3 mL, Nebulization, TID, Ottie Glazier, MD, 3 mL at 04/11/19 0829 .  losartan (COZAAR) tablet 25 mg, 25 mg, Oral, Daily, Florina Ou V, MD, 25 mg at 04/11/19 0849 .  methylPREDNISolone sodium succinate (SOLU-MEDROL) 40 mg/mL injection 40 mg, 40 mg, Intravenous, Q12H, Lanney Gins, Aamina Skiff, MD, 40 mg at 04/11/19 0506 .  metoprolol succinate (TOPROL-XL) 24 hr tablet 25 mg, 25 mg, Oral, Daily, Paraschos, Alexander, MD, 25 mg at 04/11/19 0850 .  nitroGLYCERIN (NITROSTAT) SL tablet 0.4 mg, 0.4 mg, Sublingual, Q5 Min x 3 PRN, Florina Ou V, MD .  ondansetron (ZOFRAN) injection 4 mg, 4 mg, Intravenous, Q6H PRN, Florina Ou V, MD .  ondansetron (ZOFRAN-ODT) disintegrating tablet 4 mg, 4 mg, Oral, Q24H, Ravishankar, Jayashree, MD, 4 mg at 04/11/19 0849 .  oxyCODONE (Oxy IR/ROXICODONE) immediate release tablet 10 mg, 10 mg, Oral, QID PRN, Oswald Hillock, RPH .  rifampin (RIFADIN) capsule 600 mg, 600 mg, Oral, Daily, Ravishankar, Jayashree, MD, 600 mg at 04/11/19 0849 .  rosuvastatin (CRESTOR) tablet 20 mg, 20 mg, Oral, q1800, Wyvonnia Dusky, MD, 20 mg at 04/10/19 1732 .  sodium chloride flush (NS) 0.9 % injection 3 mL, 3 mL, Intravenous, Q12H, Para Skeans, MD, 3 mL at 04/10/19 2141 .  sodium chloride flush (NS) 0.9 % injection 3 mL, 3 mL,  Intravenous, PRN, Para Skeans, MD .  tamsulosin (FLOMAX) capsule 0.4 mg, 0.4 mg, Oral, QPC supper, Florina Ou V, MD, 0.4 mg at 04/10/19 1732    ALLERGIES   Penicillins and Lidocaine     REVIEW OF SYSTEMS    Review of Systems:  Gen:  Denies  fever, sweats, chills weigh loss  HEENT: Denies blurred vision, double vision, ear pain, eye pain, hearing loss, nose bleeds, sore throat Cardiac:  No dizziness, chest pain or heaviness, chest tightness,edema Resp:   Denies cough or sputum porduction, admits to  shortness of breath, denies wheezing, hemoptysis,  Gi: Denies swallowing difficulty, stomach pain, nausea or vomiting, diarrhea, constipation, bowel incontinence Gu:  Denies bladder incontinence, burning urine Ext:   Denies Joint pain, stiffness or swelling Skin: Denies  skin rash, easy bruising or bleeding or hives Endoc:  Denies polyuria, polydipsia , polyphagia or weight change Psych:   Denies depression, insomnia or hallucinations   Other:  All other systems negative   VS: BP 114/87 (BP Location: Right Arm)   Pulse 76   Temp (!) 97.5 F (36.4 C) (Oral)   Resp 19   Ht _0  (1.651 m)   Wt 54.3 kg   SpO2 97%   BMI 19.94 kg/m      PHYSICAL EXAM    GENERAL:NAD, no fevers, chills, no weakness no fatigue HEAD: Normocephalic, atraumatic.  EYES: Pupils equal, round, reactive to light. Extraocular muscles intact. No scleral icterus.  MOUTH: Moist mucosal membrane. Dentition intact. No abscess noted.  EAR, NOSE, THROAT: Clear without exudates. No external lesions.  NECK: Supple. No thyromegaly. No nodules. No JVD.  PULMONARY: Bilateral rhonchorous breath sounds worse on the right and crackles at the right. CARDIOVASCULAR: S1 and S2. Regular rate and rhythm. No murmurs, rubs, or gallops. No edema. Pedal pulses 2+ bilaterally.  GASTROINTESTINAL: Soft, nontender, nondistended. No masses. Positive bowel sounds. No hepatosplenomegaly.  MUSCULOSKELETAL: No swelling, clubbing,  or edema. Range of motion full in all extremities.  NEUROLOGIC: Cranial nerves II through XII are intact. No gross focal neurological deficits. Sensation intact. Reflexes intact.  SKIN: No ulceration, lesions, rashes, or cyanosis. Skin warm  and dry. Turgor intact.  PSYCHIATRIC: Mood, affect within normal limits. The patient is awake, alert and oriented x 3. Insight, judgment intact.       IMAGING    DG Chest 2 View  Result Date: 04/07/2019 CLINICAL DATA:  Chest pain and shortness of breath EXAM: CHEST - 2 VIEW COMPARISON:  March 13, 2019 chest radiograph and chest CT February 13, 2019 FINDINGS: There remains consolidation with volume loss in the right upper lobe with slightly improved aeration in this area compared to prior studies. Elsewhere, there is extensive underlying emphysematous change with fibrosis. Fibrosis is most severe in the right base region. No new opacity evident. Heart size and pulmonary vascularity are normal. No adenopathy is evident. There is aortic atherosclerosis. No bone lesions. There is calcification in each carotid artery. IMPRESSION: Underlying emphysematous change in fibrosis. Consolidation in the right upper lobe is slightly less pronounced than on recent prior studies. No new opacity evident. Stable cardiac silhouette. No adenopathy demonstrable by radiography. Aortic Atherosclerosis (ICD10-I70.0). Electronically Signed   By: Lowella Grip III M.D.   On: 04/07/2019 11:20   CT Angio Chest PE W/Cm &/Or Wo Cm  Result Date: 04/07/2019 CLINICAL DATA:  Shortness of breath. EXAM: CT ANGIOGRAPHY CHEST WITH CONTRAST TECHNIQUE: Multidetector CT imaging of the chest was performed using the standard protocol during bolus administration of intravenous contrast. Multiplanar CT image reconstructions and MIPs were obtained to evaluate the vascular anatomy. CONTRAST:  50m OMNIPAQUE IOHEXOL 350 MG/ML SOLN COMPARISON:  Chest radiograph April 07, 2019; chest CT February 13, 2019  FINDINGS: Cardiovascular: There is no demonstrable pulmonary embolus. Ascending thoracic aortic diameter measures 4.6 x 4.5 cm. No dissection is appreciable. The contrast bolus in the aorta is somewhat less than optimal for dissection assessment. There is localized aneurysmal dilatation in the distal descending thoracic aorta at the diaphragmatic hiatus with localized plaque and calcification along the leftward aspect of this aneurysm. The descending thoracic aorta at the level of the diaphragmatic hiatus measures 3.8 x 3.1 cm. Appearance of this aneurysm is similar to most recent CT. There is aortic atherosclerosis as well as scattered foci of calcification in proximal visualized great vessels. There are foci of coronary artery calcification. There is left ventricular hypertrophy. No pericardial effusion or pericardial thickening. Mediastinum/Nodes: Thyroid appears unremarkable. There are mildly prominent right hilar lymph nodes, largest measuring 1.5 x 1.3 cm. Subcarinal lymph node measures 1.4 x 1.2 cm. No esophageal lesions are evident. Lungs/Pleura: There is a moderate right pleural effusion with a much smaller left pleural effusion. There is extensive underlying emphysematous change with fibrosis in the lower lung regions, significantly more on the right than on the left. There are multiple areas of cicatrization throughout the lungs bilaterally, more severe on the right than on the left. There is consolidation in the anterior segment right upper lobe with air bronchograms and volume loss. No similar consolidation elsewhere. Upper Abdomen: There is upper abdominal aortic atherosclerosis. Visualized upper abdominal structures otherwise appear unremarkable. Musculoskeletal: Anterior wedging of a midthoracic vertebral body is stable. No blastic or lytic bone lesions. No evident chest wall lesions. Review of the MIP images confirms the above findings. IMPRESSION: 1.  No demonstrable pulmonary embolus. 2.  Prominence of the ascending thoracic aorta with a measured diameter of 4.6 x 4.5 cm. Ascending thoracic aortic aneurysm. Recommend semi-annual imaging followup by CTA or MRA and referral to cardiothoracic surgery if not already obtained. This recommendation follows 2010 ACCF/AHA/AATS/ACR/ASA/SCA/SCAI/SIR/STS/SVM Guidelines for the Diagnosis and Management of Patients  With Thoracic Aortic Disease. Circulation. 2010; 121: M786-L544. Aortic aneurysm NOS (ICD10-I71.9). No dissection evident with contrast bolus somewhat less than optimal for dissection assessment. 3. Localized aneurysmal dilatation of the descending thoracic aorta at the diaphragmatic hiatus level measuring 3.8 x 3.1 cm. Localized plaque and dilatation along the leftward aspect of the aneurysm, also present previously. Note that there is aortic atherosclerosis as well as foci of great vessel and coronary artery calcification. 4. 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 Pleural effusions bilaterally, larger on the right than on the left. Persistent consolidation in the right upper lobe anteriorly with air bronchograms. Appearance most consistent with pneumonia with possibility of superimposed neoplasm not excluded anteriorly toward the apex on the right. 5.  Prominent right hilar and subcarinal lymph nodes. Aortic Atherosclerosis (ICD10-I70.0) and Emphysema (ICD10-J43.9). Aortic aneurysm NOS (ICD10-I71.9). Electronically Signed   By: Lowella Grip III M.D.   On: 04/07/2019 13:17   DG Chest Port 1 View  Result Date: 04/08/2019 CLINICAL DATA:  Post right-sided thoracentesis. EXAM: PORTABLE CHEST 1 VIEW COMPARISON:  04/07/2019; chest CT-04/07/2019 FINDINGS: Grossly unchanged cardiac silhouette and mediastinal contours with persistent obscuration of the right heart border and mediastinal stripe secondary to worsening right upper lobe heterogeneous opacities and associated volume loss. Worsening right mid  and bilateral lower lung interstitial thickening. Interval reduction/resolution of right-sided pleural effusion post thoracentesis. No pneumothorax. No acute osseous abnormalities. IMPRESSION: 1. Interval reduction/resolution of right-sided pleural effusion post thoracentesis. No pneumothorax. 2. Worsening right heterogeneous/consolidative opacities and associated volume loss. 3. Worsening right mid bilateral lower lung interstitial thickening with broad differential considerations including pulmonary edema versus progression of multifocal infection, including atypical etiologies. Electronically Signed   By: Sandi Mariscal M.D.   On: 04/08/2019 17:31   DG Chest Port 1 View  Result Date: 03/13/2019 CLINICAL DATA:  Status post bronchoscopy EXAM: PORTABLE CHEST 1 VIEW COMPARISON:  12/23/2018 FINDINGS: The heart size and mediastinal contours are within normal limits. Redemonstrated masslike consolidation of the anterior right upper lobe. Emphysema. The visualized skeletal structures are unremarkable. IMPRESSION: 1. Redemonstrated masslike consolidation of the anterior right upper lobe. No acute appearing airspace opacity. 2.  Emphysema. Electronically Signed   By: Eddie Candle M.D.   On: 03/13/2019 16:17   DG C-Arm 1-60 Min-No Report  Result Date: 03/13/2019 Fluoroscopy was utilized by the requesting physician.  No radiographic interpretation.   ECHOCARDIOGRAM COMPLETE  Result Date: 04/08/2019    ECHOCARDIOGRAM REPORT   Patient Name:   Joseph Peters Date of Exam: 04/07/2019 Medical Rec #:  [id number]        Height:       65.0 in Accession #:    [id number]       Weight:       120.6 lb Date of Birth:  02-15-1956        BSA:          1.60 m Patient Age:    64 years         BP:           121/92 mmHg Patient Gender: M                HR:           96 bpm. Exam Location:  [facility] Procedure: 2D Echo, Cardiac Doppler and Color Doppler Indications:     Elevated Troponin  History:         Patient has no prior history of  Echocardiogram examinations.                  Risk Factors:Dyslipidemia and Hypertension. Stroke. Pneumonia.                  Myocardial Infarction. Dyspnea. Coronary artery disease. COPD.                  Chest pain. AAA.  Sonographer:     Wilford Sports Rodgers-Jones Referring Phys:  [place] Diagnosing Phys: Isaias Cowman MD IMPRESSIONS  1. Left ventricular ejection fraction, by estimation, is 35 to 40%. The left ventricle has moderately decreased function. The left ventrical has no regional wall motion abnormalities. The left ventricular internal cavity size was mildly to moderately dilated. Left ventricular diastolic parameters are indeterminate.  2. Right ventricular systolic function is normal. The right ventricular size is normal.  3. No left atrial/left atrial appendage thrombus was detected.  4. The mitral valve is normal in structure and function. Mild mitral valve regurgitation. No evidence of mitral stenosis.  5. The aortic valve is normal in structure and function. Aortic valve regurgitation is not visualized. Mild to moderate aortic valve stenosis.  6. The inferior vena cava is normal in size with greater than 50% respiratory variability, suggesting right atrial pressure of 3 mmHg. FINDINGS  Left Ventricle: Left ventricular ejection fraction, by estimation, is 35 to 40%. The left ventricle has moderately decreased function. The left ventricle has no regional wall motion abnormalities. The left ventricular internal cavity size was mildly to moderately dilated. There is no. There is no left ventricular hypertrophy. Left ventricular diastolic parameters are indeterminate.  LV Wall Scoring: The basal inferolateral segment, mid inferoseptal segment, apical septal segment, basal inferior segment, and apex are hypokinetic. Right Ventricle: The right ventricular size is normal. No increase in right ventricular wall thickness. Right ventricular systolic function is normal. Left Atrium: Left  atrial size was normal in size. Right Atrium: Right atrial size was normal in size. Pericardium: There is no evidence of pericardial effusion. Mitral Valve: The mitral valve is normal in structure and function. Normal mobility of the mitral valve leaflets. Mild mitral valve regurgitation. No evidence of mitral valve stenosis. Tricuspid Valve: The tricuspid valve is normal in structure. Tricuspid valve regurgitation is mild . No evidence of tricuspid stenosis. Aortic Valve: The aortic valve is normal in structure and function. Aortic valve regurgitation is not visualized. Mild to moderate aortic stenosis is present. Aortic valve mean gradient measures 6.7 mmHg. Aortic valve peak gradient measures 9.2 mmHg. Aortic valve area, by VTI measures 0.96 cm. Pulmonic Valve: The pulmonic valve was normal in structure. Pulmonic valve regurgitation is not visualized. No evidence of pulmonic stenosis. Aorta: The aortic root is normal in size and structure. Venous: The inferior vena cava is normal in size with greater than 50% respiratory variability, suggesting right atrial pressure of 3 mmHg. The inferior vena cava and the hepatic vein show a normal flow pattern. IAS/Shunts: No atrial level shunt detected by color flow Doppler.  LEFT VENTRICLE PLAX 2D LVIDd:         5.61 cm  Diastology LVIDs:         4.21 cm  LV e' lateral:   4.35 cm/s LV PW:         0.94 cm  LV E/e' lateral: 13.5 LV IVS:        0.68 cm  LV e' medial:    5.00 cm/s LVOT diam:     2.20 cm  LV E/e' medial:  11.7 LV SV:         23.64 ml LV SV Index:   47.63 LVOT Area:     3.80 cm  RIGHT VENTRICLE RV Basal diam:  3.68 cm RV S prime:     10.40 cm/s TAPSE (M-mode): 1.8 cm LEFT ATRIUM           Index       RIGHT ATRIUM           Index LA diam:      4.20 cm 2.63 cm/m  RA Area:     13.50 cm LA Vol (A2C): 40.0 ml 25.07 ml/m RA Volume:   39.70 ml  24.88 ml/m LA Vol (A4C): 17.9 ml 11.22 ml/m  AORTIC VALVE AV Area (Vmax):    0.94 cm AV Area (Vmean):   0.87 cm AV Area  (VTI):     0.96 cm AV Vmax:           152.00 cm/s AV Vmean:          122.333 cm/s AV VTI:            0.246 m AV Peak Grad:      9.2 mmHg AV Mean Grad:      6.7 mmHg LVOT Vmax:         37.45 cm/s LVOT Vmean:        27.900 cm/s LVOT VTI:          0.062 m LVOT/AV VTI ratio: 0.25  AORTA Ao Root diam: 3.60 cm MITRAL VALVE MV Area (PHT): 7.66 cm             SHUNTS MV Decel Time: 99 msec              Systemic VTI:  0.06 m MV E velocity: 58.70 cm/s 103 cm/s  Systemic Diam: 2.20 cm MV A velocity: 52.20 cm/s 70.3 cm/s MV E/A ratio:  1.12       1.5 Isaias Cowman MD Electronically signed by Isaias Cowman MD Signature Date/Time: 04/08/2019/1:00:24 PM    Final    US THORACENTESIS ASP PLEURAL SPACE W/IMG GUIDE  Addendum Date: 04/08/2019   ADDENDUM REPORT: 04/08/2019 17:38 ADDENDUM: Note, Nesacaine was utilized for subcutaneous anesthesia purposes given history of lidocaine allergy. Patient tolerated this medication without incident. Electronically Signed   By: Sandi Mariscal M.D.   On: 04/08/2019 17:38   Result Date: 04/08/2019 INDICATION: Symptomatic right-sided sided pleural effusion EXAM: US THORACENTESIS ASP PLEURAL SPACE W/IMG GUIDE COMPARISON:  Chest radiograph-04/07/2019; chest CT-04/06/2018 MEDICATIONS: None. COMPLICATIONS: None immediate. TECHNIQUE: Informed written consent was obtained from the patient after a discussion of the risks, benefits and alternatives to treatment. A timeout was performed prior to the initiation of the procedure. Initial ultrasound scanning demonstrates a small anechoic right-sided pleural effusion. The lower chest was prepped and draped in the usual sterile fashion. 1% lidocaine was used for local anesthesia. An ultrasound image was saved for documentation purposes. An 8 Fr Safe-T-Centesis catheter was introduced under ultrasound guidance. The thoracentesis was performed. The catheter was removed and a dressing was applied. The patient tolerated the procedure well without  immediate post procedural complication. The patient was escorted to have an upright chest radiograph. FINDINGS: A total of approximately 450 cc of serous fluid was removed. Requested samples were sent to the laboratory. IMPRESSION: Successful ultrasound-guided right sided thoracentesis yielding 450 cc of pleural fluid. Electronically Signed: By: Sandi Mariscal M.D. On: 04/08/2019 17:33      ASSESSMENT/PLAN  Moderate Acute exacerbation of COPD  -Continue DuoNebs every 6 hours  -COPD care path -Budesonide 0.5 mg nebulized twice daily -Solu-Medrol 40 twice daily IV -Aggressive bronchopulmonary hygiene protocol -MetaNeb therapy twice daily with respiratory therapist-adding hypersal -Incentive spirometer and Acapella flutter valve -Physical and Occupational Therapy    Bilateral cylindrilcal bronchiectais associated with Mycobacterium Avium Complex -Patient has been started on Zithromax, rifampin, ethambutol -He is being followed by infectious disease, will defer antibiotics to Dr. Delaine Lame    Moderate right pleural effusion   -Contributing to hypoxemia -ultrasound-guided thoracentesis -Pleural fluid studies with ADA, AFB, pH, glucose, cytology, LDH, albumin, protein triglycerides,     Thank you for allowing me to participate in the care of this patient.   Patient/Family are satisfied with care plan and all questions have been answered.  This document was prepared using Dragon voice recognition software and may include unintentional dictation errors.     Ottie Glazier, M.D.  Division of [place]

## 2019-04-11 NOTE — Discharge Summary (Signed)
Physician Discharge Summary  Joseph Peters XIH:[id number] DOB: Sep 26, 1955 DOA: 04/07/2019  PCP: Barbaraann Boys, MD  Admit date: 04/07/2019 Discharge date: 04/11/2019  Admitted From: home Disposition:  Home w/ home health   Recommendations for Outpatient Follow-up:  1. Follow up with PCP in 1-2 weeks 2. F/u ID (Dr. Delaine Lame) in 1 week 3. F/u pulmon (Dr. Lanney Gins) in 1-2 weeks 4.  Home Health: yes Equipment/Devices: 3L [place]  Discharge Condition: stable CODE STATUS: full  Diet recommendation: Heart Healthy    Brief/Interim Summary: HPI was taken from Dr. Girard Cooter: Joseph Peters is a 64 y.o. male with medical history significant of mi and MAC c/o chest pain and today he could not walk and became sob and  He fell , got up from kitchen floor and then tried to go to his ID appt and was sent here  Form there for chest pain and sob.   ED Course:  Blood pressure (!) 131/98, pulse (!) 102, temperature 98.4 F (36.9 C), temperature source Oral, resp. rate 13, height _0  (1.651 m), weight 45.8 kg, SpO2 93 %.  Pt is alert and gives history. Pt reports 5/10 chest pain and is middle of chest and it radiated to right side and then to left side of chest. Pt report nausea and vomiting ad dizziness. Pt was given albuterol breathing t/t and prednisone and zofran and 2 L of lr bolus, asa and ntg.    Hospital Course from Dr. Lenise Herald 2/10-2/13/21: Pt was restarted on azithromycin, rifampin, ethambutol for MAC as per ID. Pt was given zofran prior to abxs for nausea/vomiting. Pt was also started on amikacin but it was d/c as pt Cr increased. Pt tolerated this treatment well. Of note, pt required supplemental oxygen throughout hospital stay and was d/c w/ supplemental oxygen as pt was not able to be weaned from supplemental oxygen. Pt was also d/c home health as per therapy's recommendations Furthermore, pulmon was seeing the pt was well for COPD and started the pt on IV steroids & bronchodilators.  Finally, pt was found to have ascending thoracic aortic aneurysm 4.5 x 4.6 cm and also aneurysmal dilatation of descending thoracic aorta without evidence of dissection. Vascular surg previously knew about aneurysm unable to repair until MAC infection is completely resolved as a high mortality rate w/ infection of graft as per vascular surg.  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 Amikacin d/c secondary to AKI. ID following and recs apprec    AKI: baseline Cr/GFR is unknown. Cr is trending down. Possibly secondary to abx use. Will continue to monitor.   COPD: severe. Continue on azithromycin. Continue on supplemental oxygen and wean as tolerated. Continue on bronchodilators. Pulmon following and recs apprec  Right pleural effusion: s/p right pleural thoracentesis on 04/08/19. Pulmon recs apprec   Acute hypoxic respiratory failure: likely multifactorial, secondary to above including but not limited to MAC & severe COPD. Continue on supplemental oxygen and wean as tolerated. Dropped to 88% w/ ambulation and will need to be d/c w/ supplemental oxygen. Discussed w/ CM   Elevated troponins: r/o NSTEMI. Continue on metoprolol. Continue on tele. Cardio signed off. F/u w/ Dr. Ubaldo Glassing in 1 week from d/c  HTN: continue on losartan & metoprolol  HLD: continue on statin  Thoracic aortic aneurysm: CTA chest is neg for PE but shows and ascending thoracic aortic aneurysm 4.5 x 4.6 cm and also aneurysmal dilatation of descending thoracic aorta without evidence of dissection. Vascular surg previously knew about aneurysm unable to repair until MAC infection is completely resolved as a high mortality rate w/ infection of graft as per vascular surg   Normocytic anemia: no need  for a transfusion at this time. Will continue to monitor   Smoker: pt refuses nicotine patch. Smoking cessation counseling   Discharge Instructions  Discharge Instructions    Diet - low sodium heart healthy   Complete by: As directed    Discharge instructions   Complete by: As directed    F/U PCP in 1-2 weeks; F/U ID (Dr. Delaine Lame) in 1 week; F/u pulmon (Dr. Lanney Gins) in 1-2 weeks   Increase activity slowly   Complete by: As directed      Allergies as of 04/11/2019      Reactions   Penicillins Swelling, Rash, Other (See Comments)   Swelling in throat Did it involve swelling of the face/tongue/throat, SOB, or low BP? yes Did it involve sudden or severe rash/hives, skin peeling, or any reaction on the inside of your mouth or nose? no Did you need to seek medical attention at a hospital or doctor's office? Yes When did it last happen?years  If all above answers are "NO", may proceed with cephalosporin use.   Lidocaine Other (See Comments)   Unsure  At dentist's office      Medication List    TAKE these medications   albuterol 108 (90 Base) MCG/ACT inhaler Commonly known as: VENTOLIN HFA Inhale 2 puffs into the lungs every 4 (four) hours as needed for wheezing or shortness of breath.   azithromycin 500 MG tablet Commonly known as: Zithromax Take 1 tablet (500 mg total) by mouth daily.   benzonatate 200 MG capsule Commonly known as: TESSALON Take 1 capsule (200 mg total) by mouth 3 (three) times daily as needed for up to 7 days for cough.   Daliresp 250 MCG Tabs Generic drug: Roflumilast Take 250 mcg by mouth at bedtime.   ethambutol 400 MG tablet Commonly known as: MYAMBUTOL Take 2 tablets (800 mg total) by mouth daily.   finasteride 5 MG tablet Commonly known as: PROSCAR Take 1 tablet (5 mg total) by mouth daily.   FLUoxetine 20 MG capsule Commonly known as: PROZAC Take 20 mg by mouth daily.   gabapentin 300 MG capsule Commonly known as:  NEURONTIN Take 900 mg by mouth at bedtime.   losartan 25 MG tablet Commonly known as: COZAAR Take 25 mg by mouth daily.   metoprolol succinate 25 MG 24 hr tablet Commonly known as: TOPROL-XL Take 1 tablet (25 mg total) by mouth daily. Start taking on: April 12, 2019   montelukast 10 MG tablet Commonly known as: SINGULAIR Take 10 mg by mouth at bedtime.   omeprazole 20 MG capsule Commonly known as: PRILOSEC Take 20 mg by mouth daily.   ondansetron 4 MG disintegrating tablet Commonly known as: Zofran ODT Take 1 tablet (4 mg total) by mouth every 8 (eight) hours as needed for nausea or vomiting.   Oxycodone HCl 10 MG Tabs Take 10 mg by mouth 4 (four) times daily as needed for pain.   predniSONE 20 MG tablet Commonly known as: Deltasone Take 2 tablets (40 mg total) by mouth daily for 5 days.   rifampin 300 MG capsule Commonly known as: RIFADIN Take  2 capsules (600 mg total) by mouth daily. Start taking on: April 12, 2019   rosuvastatin 5 MG tablet Commonly known as: CRESTOR Take 5 mg by mouth at bedtime.   tamsulosin 0.4 MG Caps capsule Commonly known as: FLOMAX Take 1 capsule (0.4 mg total) by mouth daily after supper.   Trelegy Ellipta 100-62.5-25 MCG/INH Aepb Generic drug: Fluticasone-Umeclidin-Vilant Inhale 2 puffs into the lungs at bedtime.            Durable Medical Equipment  (From admission, onward)         Start     Ordered   04/10/19 1412  For home use only DME oxygen  Once    Question Answer Comment  Length of Need 12 Months   Mode or (Route) Nasal cannula   Liters per Minute 2   Oxygen delivery system Gas      04/10/19 1412         Follow-up Information    Schnier, Dolores Lory, MD Follow up in 1 month(s).   Specialties: Vascular Surgery, Cardiology, Radiology, Vascular Surgery Why: To discuss abdominal aneursym surgery repair and clearance. No studies.  Contact information: [place]  [address] [phone]          Allergies  Allergen Reactions  . Penicillins Swelling, Rash and Other (See Comments)    Swelling in throat  Did it involve swelling of the face/tongue/throat, SOB, or low BP? yes Did it involve sudden or severe rash/hives, skin peeling, or any reaction on the inside of your mouth or nose? no Did you need to seek medical attention at a hospital or doctor's office? Yes When did it last happen?years  If all above answers are "NO", may proceed with cephalosporin use.    . Lidocaine Other (See Comments)    Unsure  At dentist's office    Consultations: ID Pulmon   Procedures/Studies: DG Chest 2 View  Result Date: 04/07/2019 CLINICAL DATA:  Chest pain and shortness of breath EXAM: CHEST - 2 VIEW COMPARISON:  March 13, 2019 chest radiograph and chest CT February 13, 2019 FINDINGS: There remains consolidation with volume loss in the right upper lobe with slightly improved aeration in this area compared to prior studies. Elsewhere, there is extensive underlying emphysematous change with fibrosis. Fibrosis is most severe in the right base region. No new opacity evident. Heart size and pulmonary vascularity are normal. No adenopathy is evident. There is aortic atherosclerosis. No bone lesions. There is calcification in each carotid artery. IMPRESSION: Underlying emphysematous change in fibrosis. Consolidation in the right upper lobe is slightly less pronounced than on recent prior studies. No new opacity evident. Stable cardiac silhouette. No adenopathy demonstrable by radiography. Aortic Atherosclerosis (ICD10-I70.0). Electronically Signed   By: Lowella Grip III M.D.   On: 04/07/2019 11:20   CT Angio Chest PE W/Cm &/Or Wo Cm  Result Date: 04/07/2019 CLINICAL DATA:  Shortness of breath. EXAM: CT ANGIOGRAPHY CHEST WITH CONTRAST TECHNIQUE: Multidetector CT imaging of the chest was performed using the standard protocol during bolus administration of  intravenous contrast. Multiplanar CT image reconstructions and MIPs were obtained to evaluate the vascular anatomy. CONTRAST:  64m OMNIPAQUE IOHEXOL 350 MG/ML SOLN COMPARISON:  Chest radiograph April 07, 2019; chest CT February 13, 2019 FINDINGS: Cardiovascular: There is no demonstrable pulmonary embolus. Ascending thoracic aortic diameter measures 4.6 x 4.5 cm. No dissection is appreciable. The contrast bolus in the aorta is somewhat less than optimal for dissection assessment. There is localized aneurysmal dilatation  in the distal descending thoracic aorta at the diaphragmatic hiatus with localized plaque and calcification along the leftward aspect of this aneurysm. The descending thoracic aorta at the level of the diaphragmatic hiatus measures 3.8 x 3.1 cm. Appearance of this aneurysm is similar to most recent CT. There is aortic atherosclerosis as well as scattered foci of calcification in proximal visualized great vessels. There are foci of coronary artery calcification. There is left ventricular hypertrophy. No pericardial effusion or pericardial thickening. Mediastinum/Nodes: Thyroid appears unremarkable. There are mildly prominent right hilar lymph nodes, largest measuring 1.5 x 1.3 cm. Subcarinal lymph node measures 1.4 x 1.2 cm. No esophageal lesions are evident. Lungs/Pleura: There is a moderate right pleural effusion with a much smaller left pleural effusion. There is extensive underlying emphysematous change with fibrosis in the lower lung regions, significantly more on the right than on the left. There are multiple areas of cicatrization throughout the lungs bilaterally, more severe on the right than on the left. There is consolidation in the anterior segment right upper lobe with air bronchograms and volume loss. No similar consolidation elsewhere. Upper Abdomen: There is upper abdominal aortic atherosclerosis. Visualized upper abdominal structures otherwise appear unremarkable. Musculoskeletal:  Anterior wedging of a midthoracic vertebral body is stable. No blastic or lytic bone lesions. No evident chest wall lesions. Review of the MIP images confirms the above findings. IMPRESSION: 1.  No demonstrable pulmonary embolus. 2. Prominence of the ascending thoracic aorta with a measured diameter of 4.6 x 4.5 cm. Ascending thoracic aortic aneurysm. Recommend semi-annual imaging followup by CTA or MRA and referral to cardiothoracic surgery if not already obtained. This recommendation follows 2010 ACCF/AHA/AATS/ACR/ASA/SCA/SCAI/SIR/STS/SVM Guidelines for the Diagnosis and Management of Patients With Thoracic Aortic Disease. Circulation. 2010; 121: W967-R916. Aortic aneurysm NOS (ICD10-I71.9). No dissection evident with contrast bolus somewhat less than optimal for dissection assessment. 3. Localized aneurysmal dilatation of the descending thoracic aorta at the diaphragmatic hiatus level measuring 3.8 x 3.1 cm. Localized plaque and dilatation along the leftward aspect of the aneurysm, also present previously. Note that there is aortic atherosclerosis as well as foci of great vessel and coronary artery calcification. 4. 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 Pleural effusions bilaterally, larger on the right than on the left. Persistent consolidation in the right upper lobe anteriorly with air bronchograms. Appearance most consistent with pneumonia with possibility of superimposed neoplasm not excluded anteriorly toward the apex on the right. 5.  Prominent right hilar and subcarinal lymph nodes. Aortic Atherosclerosis (ICD10-I70.0) and Emphysema (ICD10-J43.9). Aortic aneurysm NOS (ICD10-I71.9). Electronically Signed   By: Lowella Grip III M.D.   On: 04/07/2019 13:17   DG Chest Port 1 View  Result Date: 04/08/2019 CLINICAL DATA:  Post right-sided thoracentesis. EXAM: PORTABLE CHEST 1 VIEW COMPARISON:  04/07/2019; chest CT-04/07/2019 FINDINGS: Grossly unchanged  cardiac silhouette and mediastinal contours with persistent obscuration of the right heart border and mediastinal stripe secondary to worsening right upper lobe heterogeneous opacities and associated volume loss. Worsening right mid and bilateral lower lung interstitial thickening. Interval reduction/resolution of right-sided pleural effusion post thoracentesis. No pneumothorax. No acute osseous abnormalities. IMPRESSION: 1. Interval reduction/resolution of right-sided pleural effusion post thoracentesis. No pneumothorax. 2. Worsening right heterogeneous/consolidative opacities and associated volume loss. 3. Worsening right mid bilateral lower lung interstitial thickening with broad differential considerations including pulmonary edema versus progression of multifocal infection, including atypical etiologies. Electronically Signed   By: Sandi Mariscal M.D.   On: 04/08/2019 17:31  DG Chest Port 1 View  Result Date: 03/13/2019 CLINICAL DATA:  Status post bronchoscopy EXAM: PORTABLE CHEST 1 VIEW COMPARISON:  12/23/2018 FINDINGS: The heart size and mediastinal contours are within normal limits. Redemonstrated masslike consolidation of the anterior right upper lobe. Emphysema. The visualized skeletal structures are unremarkable. IMPRESSION: 1. Redemonstrated masslike consolidation of the anterior right upper lobe. No acute appearing airspace opacity. 2.  Emphysema. Electronically Signed   By: Eddie Candle M.D.   On: 03/13/2019 16:17   DG C-Arm 1-60 Min-No Report  Result Date: 03/13/2019 Fluoroscopy was utilized by the requesting physician.  No radiographic interpretation.   ECHOCARDIOGRAM COMPLETE  Result Date: 04/08/2019    ECHOCARDIOGRAM REPORT   Patient Name:   LEJON AFZAL Date of Exam: 04/07/2019 Medical Rec #:  [id number]        Height:       65.0 in Accession #:    [id number]       Weight:       120.6 lb Date of Birth:  Jul 05, 1955        BSA:          1.60 m Patient Age:    64 years         BP:            121/92 mmHg Patient Gender: M                HR:           96 bpm. Exam Location:  [facility] Procedure: 2D Echo, Cardiac Doppler and Color Doppler Indications:     Elevated Troponin  History:         Patient has no prior history of Echocardiogram examinations.                  Risk Factors:Dyslipidemia and Hypertension. Stroke. Pneumonia.                  Myocardial Infarction. Dyspnea. Coronary artery disease. COPD.                  Chest pain. AAA.  Sonographer:     Wilford Sports Rodgers-Jones Referring Phys:  [place] Diagnosing Phys: Isaias Cowman MD IMPRESSIONS  1. Left ventricular ejection fraction, by estimation, is 35 to 40%. The left ventricle has moderately decreased function. The left ventrical has no regional wall motion abnormalities. The left ventricular internal cavity size was mildly to moderately dilated. Left ventricular diastolic parameters are indeterminate.  2. Right ventricular systolic function is normal. The right ventricular size is normal.  3. No left atrial/left atrial appendage thrombus was detected.  4. The mitral valve is normal in structure and function. Mild mitral valve regurgitation. No evidence of mitral stenosis.  5. The aortic valve is normal in structure and function. Aortic valve regurgitation is not visualized. Mild to moderate aortic valve stenosis.  6. The inferior vena cava is normal in size with greater than 50% respiratory variability, suggesting right atrial pressure of 3 mmHg. FINDINGS  Left Ventricle: Left ventricular ejection fraction, by estimation, is 35 to 40%. The left ventricle has moderately decreased function. The left ventricle has no regional wall motion abnormalities. The left ventricular internal cavity size was mildly to moderately dilated. There is no. There is no left ventricular hypertrophy. Left ventricular diastolic parameters are indeterminate.  LV Wall Scoring: The basal inferolateral segment, mid inferoseptal segment, apical septal  segment, basal inferior segment, and apex are hypokinetic. Right Ventricle: The right ventricular size  is normal. No increase in right ventricular wall thickness. Right ventricular systolic function is normal. Left Atrium: Left atrial size was normal in size. Right Atrium: Right atrial size was normal in size. Pericardium: There is no evidence of pericardial effusion. Mitral Valve: The mitral valve is normal in structure and function. Normal mobility of the mitral valve leaflets. Mild mitral valve regurgitation. No evidence of mitral valve stenosis. Tricuspid Valve: The tricuspid valve is normal in structure. Tricuspid valve regurgitation is mild . No evidence of tricuspid stenosis. Aortic Valve: The aortic valve is normal in structure and function. Aortic valve regurgitation is not visualized. Mild to moderate aortic stenosis is present. Aortic valve mean gradient measures 6.7 mmHg. Aortic valve peak gradient measures 9.2 mmHg. Aortic valve area, by VTI measures 0.96 cm. Pulmonic Valve: The pulmonic valve was normal in structure. Pulmonic valve regurgitation is not visualized. No evidence of pulmonic stenosis. Aorta: The aortic root is normal in size and structure. Venous: The inferior vena cava is normal in size with greater than 50% respiratory variability, suggesting right atrial pressure of 3 mmHg. The inferior vena cava and the hepatic vein show a normal flow pattern. IAS/Shunts: No atrial level shunt detected by color flow Doppler.  LEFT VENTRICLE PLAX 2D LVIDd:         5.61 cm  Diastology LVIDs:         4.21 cm  LV e' lateral:   4.35 cm/s LV PW:         0.94 cm  LV E/e' lateral: 13.5 LV IVS:        0.68 cm  LV e' medial:    5.00 cm/s LVOT diam:     2.20 cm  LV E/e' medial:  11.7 LV SV:         23.64 ml LV SV Index:   47.63 LVOT Area:     3.80 cm  RIGHT VENTRICLE RV Basal diam:  3.68 cm RV S prime:     10.40 cm/s TAPSE (M-mode): 1.8 cm LEFT ATRIUM           Index       RIGHT ATRIUM           Index LA diam:       4.20 cm 2.63 cm/m  RA Area:     13.50 cm LA Vol (A2C): 40.0 ml 25.07 ml/m RA Volume:   39.70 ml  24.88 ml/m LA Vol (A4C): 17.9 ml 11.22 ml/m  AORTIC VALVE AV Area (Vmax):    0.94 cm AV Area (Vmean):   0.87 cm AV Area (VTI):     0.96 cm AV Vmax:           152.00 cm/s AV Vmean:          122.333 cm/s AV VTI:            0.246 m AV Peak Grad:      9.2 mmHg AV Mean Grad:      6.7 mmHg LVOT Vmax:         37.45 cm/s LVOT Vmean:        27.900 cm/s LVOT VTI:          0.062 m LVOT/AV VTI ratio: 0.25  AORTA Ao Root diam: 3.60 cm MITRAL VALVE MV Area (PHT): 7.66 cm             SHUNTS MV Decel Time: 99 msec              Systemic VTI:  0.06  m MV E velocity: 58.70 cm/s 103 cm/s  Systemic Diam: 2.20 cm MV A velocity: 52.20 cm/s 70.3 cm/s MV E/A ratio:  1.12       1.5 Isaias Cowman MD Electronically signed by Isaias Cowman MD Signature Date/Time: 04/08/2019/1:00:24 PM    Final    US THORACENTESIS ASP PLEURAL SPACE W/IMG GUIDE  Addendum Date: 04/08/2019   ADDENDUM REPORT: 04/08/2019 17:38 ADDENDUM: Note, Nesacaine was utilized for subcutaneous anesthesia purposes given history of lidocaine allergy. Patient tolerated this medication without incident. Electronically Signed   By: Sandi Mariscal M.D.   On: 04/08/2019 17:38   Result Date: 04/08/2019 INDICATION: Symptomatic right-sided sided pleural effusion EXAM: US THORACENTESIS ASP PLEURAL SPACE W/IMG GUIDE COMPARISON:  Chest radiograph-04/07/2019; chest CT-04/06/2018 MEDICATIONS: None. COMPLICATIONS: None immediate. TECHNIQUE: Informed written consent was obtained from the patient after a discussion of the risks, benefits and alternatives to treatment. A timeout was performed prior to the initiation of the procedure. Initial ultrasound scanning demonstrates a small anechoic right-sided pleural effusion. The lower chest was prepped and draped in the usual sterile fashion. 1% lidocaine was used for local anesthesia. An ultrasound image was saved for  documentation purposes. An 8 Fr Safe-T-Centesis catheter was introduced under ultrasound guidance. The thoracentesis was performed. The catheter was removed and a dressing was applied. The patient tolerated the procedure well without immediate post procedural complication. The patient was escorted to have an upright chest radiograph. FINDINGS: A total of approximately 450 cc of serous fluid was removed. Requested samples were sent to the laboratory. IMPRESSION: Successful ultrasound-guided right sided thoracentesis yielding 450 cc of pleural fluid. Electronically Signed: By: Sandi Mariscal M.D. On: 04/08/2019 17:33    Subjective: Pt c/o fatigue  Discharge Exam: Vitals:   04/11/19 0451 04/11/19 0800  BP: 105/64 114/87  Pulse: 79 76  Resp: 19 19  Temp: 98.2 F (36.8 C) (!) 97.5 F (36.4 C)  SpO2: 95% 97%   Vitals:   04/10/19 1928 04/10/19 2056 04/11/19 0451 04/11/19 0800  BP: (!) 103/94  105/64 114/87  Pulse: 82  79 76  Resp: _0 Temp: (!) 97.5 F (36.4 C)  98.2 F (36.8 C) (!) 97.5 F (36.4 C)  TempSrc: Oral  Oral Oral  SpO2: 98% 97% 95% 97%  Weight:      Height:        General: Pt is alert, awake, not in acute distress Cardiovascular:  S1/S2 +, no rubs, no gallops Respiratory: course breath sounds b/l. No rales Abdominal: Soft, NT, ND, bowel sounds + Extremities: no edema, no cyanosis    The results of significant diagnostics from this hospitalization (including imaging, microbiology, ancillary and laboratory) are listed below for reference.     Microbiology: Recent Results (from the past 240 hour(s))  Respiratory Panel by RT PCR (Flu A&B, Covid) - Nasopharyngeal Swab     Status: None   Collection Time: 04/07/19 11:36 AM   Specimen: Nasopharyngeal Swab  Result Value Ref Range Status   SARS Coronavirus 2 by RT PCR NEGATIVE NEGATIVE Final    Comment: (NOTE) SARS-CoV-2 target nucleic acids are NOT DETECTED. The SARS-CoV-2 RNA is generally detectable in upper  respiratoy specimens during the acute phase of infection. The lowest concentration of SARS-CoV-2 viral copies this assay can detect is 131 copies/mL. A negative result does not preclude SARS-Cov-2 infection and should not be used as the sole basis for treatment or other patient management decisions. A negative result may occur with  improper specimen  collection/handling, submission of specimen other than nasopharyngeal swab, presence of viral mutation(s) within the areas targeted by this assay, and inadequate number of viral copies (<131 copies/mL). A negative result must be combined with clinical observations, patient history, and epidemiological information. The expected result is Negative. Fact Sheet for Patients:  PinkCheek.be Fact Sheet for Healthcare Providers:  GravelBags.it This test is not yet ap proved or cleared by the [place] FDA and  has been authorized for detection and/or diagnosis of SARS-CoV-2 by FDA under an Emergency Use Authorization (EUA). This EUA will remain  in effect (meaning this test can be used) for the duration of the COVID-19 declaration under Section 564(b)(1) of the Act, 21 U.S.C. section 360bbb-3(b)(1), unless the authorization is terminated or revoked sooner.    Influenza A by PCR NEGATIVE NEGATIVE Final   Influenza B by PCR NEGATIVE NEGATIVE Final    Comment: (NOTE) The Xpert Xpress SARS-CoV-2/FLU/RSV assay is intended as an aid in  the diagnosis of influenza from Nasopharyngeal swab specimens and  should not be used as a sole basis for treatment. Nasal washings and  aspirates are unacceptable for Xpert Xpress SARS-CoV-2/FLU/RSV  testing. Fact Sheet for Patients: PinkCheek.be Fact Sheet for Healthcare Providers: GravelBags.it This test is not yet approved or cleared by the [place] FDA and  has been authorized for  detection and/or diagnosis of SARS-CoV-2 by  FDA under an Emergency Use Authorization (EUA). This EUA will remain  in effect (meaning this test can be used) for the duration of the  Covid-19 declaration under Section 564(b)(1) of the Act, 21  U.S.C. section 360bbb-3(b)(1), unless the authorization is  terminated or revoked. Performed at [facility], [address] [place], [place] [address]   Body fluid culture     Status: None (Preliminary result)   Collection Time: 04/08/19  4:58 PM   Specimen: PATH Cytology Pleural fluid  Result Value Ref Range Status   Specimen Description   Final    PLEURAL Performed at [facility], [address] [place], [place] [address]    Special Requests   Final    CYTO PLEU Performed at [facility], [place], [place] [address]    Gram Stain NO WBC SEEN NO ORGANISMS SEEN   Final   Culture   Final    NO GROWTH 3 DAYS Performed at [facility], [place] [address], [place], [place] [address]    Report Status PENDING  Incomplete  Acid Fast Smear (AFB)     Status: None   Collection Time: 04/08/19  4:58 PM   Specimen: PATH Cytology Pleural fluid  Result Value Ref Range Status   AFB Specimen Processing Concentration  Final   Acid Fast Smear Negative  Final    Comment: (NOTE) Performed At: [facility], [place] [id number] Rush Farmer MD ZD:[id number]    Source (AFB) PLEURAL  Final    Comment: Performed at [facility], [address] [place], [place] [address]     Labs: BNP (last 3 results) Recent Labs    04/07/19 1131  BNP 8,756.4*   Basic Metabolic Panel: Recent Labs  Lab 04/07/19 1131 04/09/19 0444 04/10/19 0647 04/11/19 0502  NA 134* 133* 134* 135  K 4.0 4.8 4.5 4.7  CL 97* 101 104 104  CO2 24 21* 22 22  GLUCOSE 104* 115* 105* 117*  BUN 15 20 24* 24*  CREATININE 1.18 1.16 1.34* 1.25*  CALCIUM 8.7* 8.2*  8.6* 8.2*   Liver Function  Tests: Recent Labs  Lab 04/09/19 0444 04/10/19 0647 04/11/19 0502  AST 38 29 26  ALT _0 ALKPHOS 99 96 99  BILITOT 0.9 0.8 0.4  PROT 6.0* 5.9* 5.9*  ALBUMIN 2.8* 2.5* 2.7*   No results for input(s): LIPASE, AMYLASE in the last 168 hours. No results for input(s): AMMONIA in the last 168 hours. CBC: Recent Labs  Lab 04/07/19 1131 04/08/19 0203 04/09/19 0444 04/10/19 0647 04/11/19 0502  WBC 9.6 8.0 3.8* 9.5 6.8  HGB 13.2 10.8* 11.5* 12.1* 11.5*  HCT 39.3 32.3* 35.2* 36.1* 34.8*  MCV 88.1 89.5 90.3 89.6 89.9  PLT 238 190 224 260 237   Cardiac Enzymes: No results for input(s): CKTOTAL, CKMB, CKMBINDEX, TROPONINI in the last 168 hours. BNP: Invalid input(s): POCBNP CBG: No results for input(s): GLUCAP in the last 168 hours. D-Dimer No results for input(s): DDIMER in the last 72 hours. Hgb A1c No results for input(s): HGBA1C in the last 72 hours. Lipid Profile No results for input(s): CHOL, HDL, LDLCALC, TRIG, CHOLHDL, LDLDIRECT in the last 72 hours. Thyroid function studies No results for input(s): TSH, T4TOTAL, T3FREE, THYROIDAB in the last 72 hours.  Invalid input(s): FREET3 Anemia work up No results for input(s): VITAMINB12, FOLATE, FERRITIN, TIBC, IRON, RETICCTPCT in the last 72 hours. Urinalysis    Component Value Date/Time   COLORURINE STRAW (A) 10/14/2018 1112   APPEARANCEUR CLEAR (A) 10/14/2018 1112   APPEARANCEUR Clear 11/02/2014 1602   LABSPEC 1.036 (H) 10/14/2018 1112   PHURINE 6.0 10/14/2018 1112   GLUCOSEU NEGATIVE 10/14/2018 1112   HGBUR NEGATIVE 10/14/2018 1112   BILIRUBINUR NEGATIVE 10/14/2018 1112   BILIRUBINUR Negative 11/02/2014 1602   KETONESUR 5 (A) 10/14/2018 1112   PROTEINUR NEGATIVE 10/14/2018 1112   NITRITE NEGATIVE 10/14/2018 1112   LEUKOCYTESUR NEGATIVE 10/14/2018 1112   Sepsis Labs Invalid input(s): PROCALCITONIN,  WBC,  LACTICIDVEN Microbiology Recent Results (from the past 240 hour(s))  Respiratory Panel by RT PCR (Flu  A&B, Covid) - Nasopharyngeal Swab     Status: None   Collection Time: 04/07/19 11:36 AM   Specimen: Nasopharyngeal Swab  Result Value Ref Range Status   SARS Coronavirus 2 by RT PCR NEGATIVE NEGATIVE Final    Comment: (NOTE) SARS-CoV-2 target nucleic acids are NOT DETECTED. The SARS-CoV-2 RNA is generally detectable in upper respiratoy specimens during the acute phase of infection. The lowest concentration of SARS-CoV-2 viral copies this assay can detect is 131 copies/mL. A negative result does not preclude SARS-Cov-2 infection and should not be used as the sole basis for treatment or other patient management decisions. A negative result may occur with  improper specimen collection/handling, submission of specimen other than nasopharyngeal swab, presence of viral mutation(s) within the areas targeted by this assay, and inadequate number of viral copies (<131 copies/mL). A negative result must be combined with clinical observations, patient history, and epidemiological information. The expected result is Negative. Fact Sheet for Patients:  PinkCheek.be Fact Sheet for Healthcare Providers:  GravelBags.it This test is not yet ap proved or cleared by the [place] FDA and  has been authorized for detection and/or diagnosis of SARS-CoV-2 by FDA under an Emergency Use Authorization (EUA). This EUA will remain  in effect (meaning this test can be used) for the duration of the COVID-19 declaration under Section 564(b)(1) of the Act, 21 U.S.C. section 360bbb-3(b)(1), unless the authorization is terminated or revoked sooner.    Influenza A by PCR NEGATIVE  NEGATIVE Final   Influenza B by PCR NEGATIVE NEGATIVE Final    Comment: (NOTE) The Xpert Xpress SARS-CoV-2/FLU/RSV assay is intended as an aid in  the diagnosis of influenza from Nasopharyngeal swab specimens and  should not be used as a sole basis for treatment. Nasal washings  and  aspirates are unacceptable for Xpert Xpress SARS-CoV-2/FLU/RSV  testing. Fact Sheet for Patients: PinkCheek.be Fact Sheet for Healthcare Providers: GravelBags.it This test is not yet approved or cleared by the [place] FDA and  has been authorized for detection and/or diagnosis of SARS-CoV-2 by  FDA under an Emergency Use Authorization (EUA). This EUA will remain  in effect (meaning this test can be used) for the duration of the  Covid-19 declaration under Section 564(b)(1) of the Act, 21  U.S.C. section 360bbb-3(b)(1), unless the authorization is  terminated or revoked. Performed at [facility], [address] [place], [place] [address]   Body fluid culture     Status: None (Preliminary result)   Collection Time: 04/08/19  4:58 PM   Specimen: PATH Cytology Pleural fluid  Result Value Ref Range Status   Specimen Description   Final    PLEURAL Performed at [facility], [address] [place], [place] [address]    Special Requests   Final    CYTO PLEU Performed at [facility], [place], [place] [address]    Gram Stain NO WBC SEEN NO ORGANISMS SEEN   Final   Culture   Final    NO GROWTH 3 DAYS Performed at [facility], [place] [address], [place], [place] [address]    Report Status PENDING  Incomplete  Acid Fast Smear (AFB)     Status: None   Collection Time: 04/08/19  4:58 PM   Specimen: PATH Cytology Pleural fluid  Result Value Ref Range Status   AFB Specimen Processing Concentration  Final   Acid Fast Smear Negative  Final    Comment: (NOTE) Performed At: [facility] [address] [place], [place] [id number] Rush Farmer MD MS:[id number]    Source (AFB) PLEURAL  Final    Comment: Performed at [facility], [address] [place], [place] [address]     Time coordinating discharge: Over 30 minutes  SIGNED:   Wyvonnia Dusky, MD  [facility] 04/11/2019, 12:15 PM Pager   If 7PM-7AM, please contact night-coverage [email or web address]

## 2019-04-11 NOTE — Progress Notes (Signed)
Patient discharged to home. Tele and IVs d/c'd prior to discharge. Patient verbalizes understanding of discharge instructions.

## 2019-04-12 LAB — BODY FLUID CULTURE
Culture: NO GROWTH
Gram Stain: NONE SEEN

## 2019-04-13 LAB — COMP PANEL: LEUKEMIA/LYMPHOMA

## 2019-04-13 LAB — CYTOLOGY - NON PAP

## 2019-04-14 ENCOUNTER — Telehealth: Payer: Self-pay | Admitting: Infectious Diseases

## 2019-04-14 ENCOUNTER — Ambulatory Visit: Payer: Medicare Other | Attending: Infectious Diseases | Admitting: Infectious Diseases

## 2019-04-14 ENCOUNTER — Other Ambulatory Visit: Payer: Self-pay

## 2019-04-14 DIAGNOSIS — A310 Pulmonary mycobacterial infection: Secondary | ICD-10-CM | POA: Diagnosis not present

## 2019-04-14 DIAGNOSIS — J449 Chronic obstructive pulmonary disease, unspecified: Secondary | ICD-10-CM | POA: Diagnosis not present

## 2019-04-14 NOTE — Telephone Encounter (Signed)
Patient's friend(Dana) called stating patient is "not looking good" Coughing, and having trouble sleeping.  Wanted to discuss medication with  Dr R today.  Put him on for a virtual visit today.

## 2019-04-14 NOTE — Progress Notes (Signed)
The purpose of this virtual visit is to provide medical care while limiting exposure to the novel coronavirus (COVID19) for both patient and office staff.   Consent was obtained for phone visit:  Yes.   Answered questions that patient had about telehealth interaction:  Yes.   I discussed the limitations, risks, security and privacy concerns of performing an evaluation and management service by telephone. I also discussed with the patient that there may be a patient responsible charge related to this service. The patient expressed understanding and agreed to proceed.   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 He also has some loose stools This happened when he initially started MAC treatment last month and he had to stop the meds. He has no fever Told him and Joseph Peters to start the antibiotic one at a time and se whether he can tolerate it- will start azithromycin tomorrow and then will add rifampin after 2-3 days and then ethambutol- will follow up in 2 weeks Asked him to follow up with pulmonary doc for sob and cough Total time spent: 8 min on the phone

## 2019-04-15 LAB — PH, BODY FLUID: pH, Body Fluid: 8.1

## 2019-04-16 ENCOUNTER — Other Ambulatory Visit: Payer: Self-pay

## 2019-04-16 ENCOUNTER — Emergency Department: Payer: Medicare Other

## 2019-04-16 ENCOUNTER — Inpatient Hospital Stay
Admission: EM | Admit: 2019-04-16 | Discharge: 2019-04-23 | DRG: 189 | Disposition: A | Discharge status: Home Health | Payer: Medicare Other | Attending: Internal Medicine | Admitting: Internal Medicine

## 2019-04-16 ENCOUNTER — Encounter: Payer: Self-pay | Admitting: Emergency Medicine

## 2019-04-16 DIAGNOSIS — I2510 Atherosclerotic heart disease of native coronary artery without angina pectoris: Secondary | ICD-10-CM | POA: Diagnosis present

## 2019-04-16 DIAGNOSIS — I248 Other forms of acute ischemic heart disease: Secondary | ICD-10-CM | POA: Diagnosis present

## 2019-04-16 DIAGNOSIS — J9600 Acute respiratory failure, unspecified whether with hypoxia or hypercapnia: Secondary | ICD-10-CM | POA: Diagnosis present

## 2019-04-16 DIAGNOSIS — I712 Thoracic aortic aneurysm, without rupture: Secondary | ICD-10-CM | POA: Diagnosis present

## 2019-04-16 DIAGNOSIS — J441 Chronic obstructive pulmonary disease with (acute) exacerbation: Secondary | ICD-10-CM

## 2019-04-16 DIAGNOSIS — I5023 Acute on chronic systolic (congestive) heart failure: Secondary | ICD-10-CM | POA: Diagnosis present

## 2019-04-16 DIAGNOSIS — Z8619 Personal history of other infectious and parasitic diseases: Secondary | ICD-10-CM

## 2019-04-16 DIAGNOSIS — J90 Pleural effusion, not elsewhere classified: Secondary | ICD-10-CM | POA: Diagnosis present

## 2019-04-16 DIAGNOSIS — Z96641 Presence of right artificial hip joint: Secondary | ICD-10-CM | POA: Diagnosis not present

## 2019-04-16 DIAGNOSIS — J189 Pneumonia, unspecified organism: Secondary | ICD-10-CM | POA: Diagnosis present

## 2019-04-16 DIAGNOSIS — K589 Irritable bowel syndrome without diarrhea: Secondary | ICD-10-CM | POA: Diagnosis present

## 2019-04-16 DIAGNOSIS — I110 Hypertensive heart disease with heart failure: Secondary | ICD-10-CM | POA: Diagnosis present

## 2019-04-16 DIAGNOSIS — Z681 Body mass index (BMI) 19 or less, adult: Secondary | ICD-10-CM

## 2019-04-16 DIAGNOSIS — K219 Gastro-esophageal reflux disease without esophagitis: Secondary | ICD-10-CM | POA: Diagnosis present

## 2019-04-16 DIAGNOSIS — Z880 Allergy status to penicillin: Secondary | ICD-10-CM

## 2019-04-16 DIAGNOSIS — R0600 Dyspnea, unspecified: Secondary | ICD-10-CM

## 2019-04-16 DIAGNOSIS — A310 Pulmonary mycobacterial infection: Secondary | ICD-10-CM | POA: Diagnosis not present

## 2019-04-16 DIAGNOSIS — J440 Chronic obstructive pulmonary disease with acute lower respiratory infection: Secondary | ICD-10-CM | POA: Diagnosis not present

## 2019-04-16 DIAGNOSIS — Z515 Encounter for palliative care: Secondary | ICD-10-CM | POA: Diagnosis present

## 2019-04-16 DIAGNOSIS — F1020 Alcohol dependence, uncomplicated: Secondary | ICD-10-CM | POA: Diagnosis present

## 2019-04-16 DIAGNOSIS — R4589 Other symptoms and signs involving emotional state: Secondary | ICD-10-CM

## 2019-04-16 DIAGNOSIS — F17210 Nicotine dependence, cigarettes, uncomplicated: Secondary | ICD-10-CM | POA: Diagnosis present

## 2019-04-16 DIAGNOSIS — G250 Essential tremor: Secondary | ICD-10-CM | POA: Diagnosis present

## 2019-04-16 DIAGNOSIS — J9621 Acute and chronic respiratory failure with hypoxia: Principal | ICD-10-CM

## 2019-04-16 DIAGNOSIS — Z79899 Other long term (current) drug therapy: Secondary | ICD-10-CM

## 2019-04-16 DIAGNOSIS — G2581 Restless legs syndrome: Secondary | ICD-10-CM | POA: Diagnosis present

## 2019-04-16 DIAGNOSIS — Z809 Family history of malignant neoplasm, unspecified: Secondary | ICD-10-CM

## 2019-04-16 DIAGNOSIS — J209 Acute bronchitis, unspecified: Secondary | ICD-10-CM | POA: Diagnosis not present

## 2019-04-16 DIAGNOSIS — J470 Bronchiectasis with acute lower respiratory infection: Secondary | ICD-10-CM | POA: Diagnosis present

## 2019-04-16 DIAGNOSIS — J439 Emphysema, unspecified: Secondary | ICD-10-CM | POA: Diagnosis present

## 2019-04-16 DIAGNOSIS — Z7189 Other specified counseling: Secondary | ICD-10-CM

## 2019-04-16 DIAGNOSIS — I252 Old myocardial infarction: Secondary | ICD-10-CM

## 2019-04-16 DIAGNOSIS — R0602 Shortness of breath: Secondary | ICD-10-CM | POA: Diagnosis not present

## 2019-04-16 DIAGNOSIS — Z20822 Contact with and (suspected) exposure to covid-19: Secondary | ICD-10-CM | POA: Diagnosis present

## 2019-04-16 DIAGNOSIS — Z79891 Long term (current) use of opiate analgesic: Secondary | ICD-10-CM

## 2019-04-16 DIAGNOSIS — Z720 Tobacco use: Secondary | ICD-10-CM | POA: Diagnosis not present

## 2019-04-16 DIAGNOSIS — Z7952 Long term (current) use of systemic steroids: Secondary | ICD-10-CM

## 2019-04-16 DIAGNOSIS — Z8673 Personal history of transient ischemic attack (TIA), and cerebral infarction without residual deficits: Secondary | ICD-10-CM

## 2019-04-16 DIAGNOSIS — M1990 Unspecified osteoarthritis, unspecified site: Secondary | ICD-10-CM | POA: Diagnosis present

## 2019-04-16 DIAGNOSIS — J449 Chronic obstructive pulmonary disease, unspecified: Secondary | ICD-10-CM | POA: Diagnosis not present

## 2019-04-16 DIAGNOSIS — E785 Hyperlipidemia, unspecified: Secondary | ICD-10-CM | POA: Diagnosis present

## 2019-04-16 DIAGNOSIS — R64 Cachexia: Secondary | ICD-10-CM | POA: Diagnosis present

## 2019-04-16 DIAGNOSIS — Z96611 Presence of right artificial shoulder joint: Secondary | ICD-10-CM | POA: Diagnosis not present

## 2019-04-16 DIAGNOSIS — I714 Abdominal aortic aneurysm, without rupture: Secondary | ICD-10-CM | POA: Diagnosis not present

## 2019-04-16 DIAGNOSIS — E43 Unspecified severe protein-calorie malnutrition: Secondary | ICD-10-CM | POA: Diagnosis present

## 2019-04-16 DIAGNOSIS — J8410 Pulmonary fibrosis, unspecified: Secondary | ICD-10-CM | POA: Diagnosis present

## 2019-04-16 DIAGNOSIS — G894 Chronic pain syndrome: Secondary | ICD-10-CM | POA: Diagnosis present

## 2019-04-16 DIAGNOSIS — J9601 Acute respiratory failure with hypoxia: Secondary | ICD-10-CM | POA: Diagnosis not present

## 2019-04-16 DIAGNOSIS — F418 Other specified anxiety disorders: Secondary | ICD-10-CM

## 2019-04-16 DIAGNOSIS — Z884 Allergy status to anesthetic agent status: Secondary | ICD-10-CM | POA: Diagnosis not present

## 2019-04-16 DIAGNOSIS — Z96612 Presence of left artificial shoulder joint: Secondary | ICD-10-CM | POA: Diagnosis not present

## 2019-04-16 DIAGNOSIS — Z9889 Other specified postprocedural states: Secondary | ICD-10-CM

## 2019-04-16 DIAGNOSIS — I10 Essential (primary) hypertension: Secondary | ICD-10-CM | POA: Diagnosis not present

## 2019-04-16 DIAGNOSIS — Z825 Family history of asthma and other chronic lower respiratory diseases: Secondary | ICD-10-CM

## 2019-04-16 LAB — COMPREHENSIVE METABOLIC PANEL
ALT: 35 U/L (ref 0–44)
AST: 21 U/L (ref 15–41)
Albumin: 3.3 g/dL — ABNORMAL LOW (ref 3.5–5.0)
Alkaline Phosphatase: 102 U/L (ref 38–126)
Anion gap: 9 (ref 5–15)
BUN: 13 mg/dL (ref 8–23)
CO2: 25 mmol/L (ref 22–32)
Calcium: 8.8 mg/dL — ABNORMAL LOW (ref 8.9–10.3)
Chloride: 99 mmol/L (ref 98–111)
Creatinine, Ser: 1.2 mg/dL (ref 0.61–1.24)
GFR calc Af Amer: >60 mL/min (ref >60)
GFR calc non Af Amer: >60 mL/min (ref >60)
Glucose, Bld: 152 mg/dL — ABNORMAL HIGH (ref 70–99)
Potassium: 5.1 mmol/L (ref 3.5–5.1)
Sodium: 133 mmol/L — ABNORMAL LOW (ref 135–145)
Total Bilirubin: 0.5 mg/dL (ref 0.3–1.2)
Total Protein: 7.4 g/dL (ref 6.5–8.1)

## 2019-04-16 LAB — CBC WITH DIFFERENTIAL/PLATELET
Abs Immature Granulocytes: 0.06 10*3/uL (ref 0.00–0.07)
Basophils Absolute: 0 10*3/uL (ref 0.0–0.1)
Basophils Relative: 0 %
Eosinophils Absolute: 0.1 10*3/uL (ref 0.0–0.5)
Eosinophils Relative: 1 %
HCT: 44.2 % (ref 39.0–52.0)
Hemoglobin: 14.6 g/dL (ref 13.0–17.0)
Immature Granulocytes: 1 %
Lymphocytes Relative: 10 %
Lymphs Abs: 1.1 10*3/uL (ref 0.7–4.0)
MCH: 29.5 pg (ref 26.0–34.0)
MCHC: 33 g/dL (ref 30.0–36.0)
MCV: 89.3 fL (ref 80.0–100.0)
Monocytes Absolute: 1 10*3/uL (ref 0.1–1.0)
Monocytes Relative: 9 %
Neutro Abs: 9.4 10*3/uL — ABNORMAL HIGH (ref 1.7–7.7)
Neutrophils Relative %: 79 %
Platelets: 230 10*3/uL (ref 150–400)
RBC: 4.95 MIL/uL (ref 4.22–5.81)
RDW: 14.6 % (ref 11.5–15.5)
WBC: 11.7 10*3/uL — ABNORMAL HIGH (ref 4.0–10.5)
nRBC: 0 % (ref 0.0–0.2)

## 2019-04-16 LAB — RESPIRATORY PANEL BY RT PCR (FLU A&B, COVID)
Influenza A by PCR: NEGATIVE
Influenza B by PCR: NEGATIVE
SARS Coronavirus 2 by RT PCR: NEGATIVE

## 2019-04-16 LAB — GLUCOSE, CAPILLARY: Glucose-Capillary: 144 mg/dL — ABNORMAL HIGH (ref 70–99)

## 2019-04-16 LAB — PROCALCITONIN: Procalcitonin: <0.1 ng/mL

## 2019-04-16 LAB — TROPONIN I (HIGH SENSITIVITY)
Troponin I (High Sensitivity): 136 ng/L (ref <18)
Troponin I (High Sensitivity): 112 ng/L (ref <18)

## 2019-04-16 LAB — BRAIN NATRIURETIC PEPTIDE: B Natriuretic Peptide: >4500 pg/mL — ABNORMAL HIGH (ref 0.0–100.0)

## 2019-04-16 MED ORDER — VANCOMYCIN HCL 1250 MG/250ML IV SOLN
1250.0000 mg | Freq: Once | INTRAVENOUS | Status: AC
  Administered 2019-04-16: 1250 mg via INTRAVENOUS
  Filled 2019-04-16: qty 250

## 2019-04-16 MED ORDER — OXYCODONE HCL 5 MG PO TABS: 10.0000 mg | ORAL_TABLET | Freq: Four times a day (QID) | ORAL | Status: DC | PRN

## 2019-04-16 MED ORDER — IOHEXOL 350 MG/ML SOLN
75.0000 mL | Freq: Once | INTRAVENOUS | Status: AC | PRN
  Administered 2019-04-16: 75 mL via INTRAVENOUS

## 2019-04-16 MED ORDER — FINASTERIDE 5 MG PO TABS
5.0000 mg | ORAL_TABLET | Freq: Every day | ORAL | Status: DC
  Administered 2019-04-16 – 2019-04-23 (×8): 5 mg via ORAL
  Filled 2019-04-16 (×9): qty 1

## 2019-04-16 MED ORDER — SODIUM CHLORIDE 0.9 % IV SOLN
2.0000 g | Freq: Two times a day (BID) | INTRAVENOUS | Status: DC
  Administered 2019-04-16: 2 g via INTRAVENOUS
  Filled 2019-04-16: qty 2

## 2019-04-16 MED ORDER — IPRATROPIUM-ALBUTEROL 0.5-2.5 (3) MG/3ML IN SOLN
RESPIRATORY_TRACT | Status: AC
  Filled 2019-04-16: qty 3

## 2019-04-16 MED ORDER — MAGNESIUM SULFATE 2 GM/50ML IV SOLN
2.0000 g | Freq: Once | INTRAVENOUS | Status: AC
  Administered 2019-04-16: 2 g via INTRAVENOUS
  Filled 2019-04-16: qty 50

## 2019-04-16 MED ORDER — AZITHROMYCIN 500 MG PO TABS: 500.0000 mg | ORAL_TABLET | Freq: Every day | ORAL | Status: DC

## 2019-04-16 MED ORDER — BUDESONIDE 0.5 MG/2ML IN SUSP
0.5000 mg | Freq: Two times a day (BID) | RESPIRATORY_TRACT | Status: DC
  Administered 2019-04-16 – 2019-04-19 (×7): 0.5 mg via RESPIRATORY_TRACT
  Filled 2019-04-16 (×7): qty 2

## 2019-04-16 MED ORDER — FENTANYL CITRATE (PF) 100 MCG/2ML IJ SOLN
50.0000 ug | Freq: Once | INTRAMUSCULAR | Status: AC
  Administered 2019-04-16: 50 ug via INTRAVENOUS
  Filled 2019-04-16: qty 2

## 2019-04-16 MED ORDER — ROFLUMILAST 500 MCG PO TABS
250.0000 ug | ORAL_TABLET | Freq: Every day | ORAL | Status: DC
  Administered 2019-04-17 – 2019-04-23 (×7): 250 ug via ORAL
  Filled 2019-04-16 (×7): qty 1

## 2019-04-16 MED ORDER — HYDROCOD POLST-CPM POLST ER 10-8 MG/5ML PO SUER
5.0000 mL | Freq: Two times a day (BID) | ORAL | Status: DC | PRN
  Administered 2019-04-16 – 2019-04-19 (×2): 5 mL via ORAL
  Filled 2019-04-16 (×2): qty 5

## 2019-04-16 MED ORDER — IPRATROPIUM-ALBUTEROL 0.5-2.5 (3) MG/3ML IN SOLN
3.0000 mL | Freq: Once | RESPIRATORY_TRACT | Status: AC
  Administered 2019-04-16: 3 mL via RESPIRATORY_TRACT
  Filled 2019-04-16: qty 3

## 2019-04-16 MED ORDER — ENOXAPARIN SODIUM 40 MG/0.4ML ~~LOC~~ SOLN
40.0000 mg | SUBCUTANEOUS | Status: DC
  Administered 2019-04-17 – 2019-04-22 (×6): 40 mg via SUBCUTANEOUS
  Filled 2019-04-16 (×6): qty 0.4

## 2019-04-16 MED ORDER — METHYLPREDNISOLONE SODIUM SUCC 125 MG IJ SOLR
60.0000 mg | Freq: Four times a day (QID) | INTRAMUSCULAR | Status: AC
  Administered 2019-04-16 – 2019-04-17 (×4): 60 mg via INTRAVENOUS
  Filled 2019-04-16 (×4): qty 2

## 2019-04-16 MED ORDER — METHYLPREDNISOLONE SODIUM SUCC 125 MG IJ SOLR
60.0000 mg | Freq: Once | INTRAMUSCULAR | Status: AC
  Administered 2019-04-16: 60 mg via INTRAVENOUS
  Filled 2019-04-16: qty 2

## 2019-04-16 MED ORDER — LOSARTAN POTASSIUM 25 MG PO TABS
25.0000 mg | ORAL_TABLET | Freq: Every day | ORAL | Status: DC
  Administered 2019-04-16 – 2019-04-23 (×6): 25 mg via ORAL
  Filled 2019-04-16 (×6): qty 1

## 2019-04-16 MED ORDER — RIFAMPIN 300 MG PO CAPS: 600.0000 mg | ORAL_CAPSULE | Freq: Every day | ORAL | Status: DC

## 2019-04-16 MED ORDER — IPRATROPIUM-ALBUTEROL 0.5-2.5 (3) MG/3ML IN SOLN: 3.0000 mL | Freq: Four times a day (QID) | RESPIRATORY_TRACT | Status: DC | PRN

## 2019-04-16 MED ORDER — FLUOXETINE HCL 20 MG PO CAPS
20.0000 mg | ORAL_CAPSULE | Freq: Every day | ORAL | Status: DC
  Administered 2019-04-16 – 2019-04-23 (×8): 20 mg via ORAL
  Filled 2019-04-16 (×8): qty 1

## 2019-04-16 MED ORDER — PREDNISONE 20 MG PO TABS
40.0000 mg | ORAL_TABLET | Freq: Every day | ORAL | Status: AC
  Administered 2019-04-18 – 2019-04-21 (×4): 40 mg via ORAL
  Filled 2019-04-16 (×3): qty 2

## 2019-04-16 MED ORDER — MONTELUKAST SODIUM 10 MG PO TABS
10.0000 mg | ORAL_TABLET | Freq: Every day | ORAL | Status: DC
  Administered 2019-04-16 – 2019-04-22 (×7): 10 mg via ORAL
  Filled 2019-04-16 (×8): qty 1

## 2019-04-16 MED ORDER — IPRATROPIUM-ALBUTEROL 0.5-2.5 (3) MG/3ML IN SOLN
3.0000 mL | Freq: Four times a day (QID) | RESPIRATORY_TRACT | Status: DC
  Administered 2019-04-16 – 2019-04-23 (×26): 3 mL via RESPIRATORY_TRACT
  Filled 2019-04-16 (×27): qty 3

## 2019-04-16 MED ORDER — ENOXAPARIN SODIUM 40 MG/0.4ML ~~LOC~~ SOLN: 40.0000 mg | SUBCUTANEOUS | Status: DC

## 2019-04-16 MED ORDER — TRAZODONE HCL 50 MG PO TABS
50.0000 mg | ORAL_TABLET | Freq: Every evening | ORAL | Status: DC | PRN
  Administered 2019-04-17: 50 mg via ORAL
  Filled 2019-04-16: qty 1

## 2019-04-16 MED ORDER — METOPROLOL SUCCINATE ER 25 MG PO TB24
25.0000 mg | ORAL_TABLET | Freq: Every day | ORAL | Status: DC
  Administered 2019-04-16 – 2019-04-23 (×7): 25 mg via ORAL
  Filled 2019-04-16 (×8): qty 1

## 2019-04-16 MED ORDER — ETHAMBUTOL HCL 400 MG PO TABS: 800.0000 mg | ORAL_TABLET | Freq: Every day | ORAL | Status: DC

## 2019-04-16 MED ORDER — FLUTICASONE-UMECLIDIN-VILANT 100-62.5-25 MCG/INH IN AEPB: 2.0000 | INHALATION_SPRAY | Freq: Every day | RESPIRATORY_TRACT | Status: DC

## 2019-04-16 MED ORDER — ORAL CARE MOUTH RINSE
15.0000 mL | Freq: Two times a day (BID) | OROMUCOSAL | Status: DC
  Administered 2019-04-17 – 2019-04-22 (×12): 15 mL via OROMUCOSAL

## 2019-04-16 MED ORDER — ONDANSETRON 4 MG PO TBDP
4.0000 mg | ORAL_TABLET | Freq: Three times a day (TID) | ORAL | Status: DC | PRN
  Administered 2019-04-17: 4 mg via ORAL
  Filled 2019-04-16 (×2): qty 1

## 2019-04-16 MED ORDER — ONDANSETRON HCL 4 MG/2ML IJ SOLN
4.0000 mg | Freq: Once | INTRAMUSCULAR | Status: AC
  Administered 2019-04-16: 4 mg via INTRAVENOUS
  Filled 2019-04-16: qty 2

## 2019-04-16 MED ORDER — ALBUTEROL SULFATE HFA 108 (90 BASE) MCG/ACT IN AERS
2.0000 | INHALATION_SPRAY | RESPIRATORY_TRACT | Status: DC | PRN
  Filled 2019-04-16: qty 6.7

## 2019-04-16 MED ORDER — SODIUM CHLORIDE 0.9 % IV BOLUS: 1000.0000 mL | Freq: Once | INTRAVENOUS | Status: DC

## 2019-04-16 MED ORDER — BENZONATATE 100 MG PO CAPS
200.0000 mg | ORAL_CAPSULE | Freq: Three times a day (TID) | ORAL | Status: DC | PRN
  Administered 2019-04-17: 200 mg via ORAL
  Filled 2019-04-16: qty 2

## 2019-04-16 MED ORDER — CHLORHEXIDINE GLUCONATE CLOTH 2 % EX PADS
6.0000 | MEDICATED_PAD | Freq: Every day | CUTANEOUS | Status: DC
  Administered 2019-04-16 – 2019-04-23 (×8): 6 via TOPICAL
  Filled 2019-04-16: qty 6

## 2019-04-16 MED ORDER — MORPHINE SULFATE (PF) 2 MG/ML IV SOLN
1.0000 mg | INTRAVENOUS | Status: DC | PRN
  Administered 2019-04-19: 2 mg via INTRAVENOUS
  Filled 2019-04-16: qty 1

## 2019-04-16 MED ORDER — PANTOPRAZOLE SODIUM 40 MG PO TBEC
40.0000 mg | DELAYED_RELEASE_TABLET | Freq: Every day | ORAL | Status: DC
  Administered 2019-04-16 – 2019-04-19 (×4): 40 mg via ORAL
  Filled 2019-04-16 (×4): qty 1

## 2019-04-16 MED ORDER — HYDROCODONE-ACETAMINOPHEN 5-325 MG PO TABS
1.0000 | ORAL_TABLET | Freq: Four times a day (QID) | ORAL | Status: DC | PRN
  Administered 2019-04-16: 1 via ORAL
  Administered 2019-04-20: 2 via ORAL
  Administered 2019-04-20: 1 via ORAL
  Filled 2019-04-16: qty 2
  Filled 2019-04-16 (×2): qty 1

## 2019-04-16 MED ORDER — GABAPENTIN 300 MG PO CAPS
900.0000 mg | ORAL_CAPSULE | Freq: Every day | ORAL | Status: DC
  Administered 2019-04-16 – 2019-04-22 (×7): 900 mg via ORAL
  Filled 2019-04-16 (×7): qty 3

## 2019-04-16 MED ORDER — TAMSULOSIN HCL 0.4 MG PO CAPS
0.4000 mg | ORAL_CAPSULE | Freq: Every day | ORAL | Status: DC
  Administered 2019-04-17 – 2019-04-23 (×7): 0.4 mg via ORAL
  Filled 2019-04-16 (×7): qty 1

## 2019-04-16 NOTE — ED Provider Notes (Signed)
-----------------------------------------   5:10 PM on 04/16/2019 -----------------------------------------  Patient care assumed from Dr. Ellender Hose.  Patient has COPD, MAI infection, recent admission for respiratory failure.  Patient presents for shortness of breath, doing much better on BiPAP, calm.  Patient's O2 saturation on 2 L dropped as low as 84%.  Maintaining 98 to 99% saturations on BiPAP.  Patient denied any fever.  Patient CTA is negative for acute PE, stable MAI infection.  Patient's BNP is greater than 4500.  Patient receiving antibiotics for possible superimposed pneumonia, steroids for possible COPD exacerbation.  Dr. Steva Ready is aware of the patient agreeable to plan to cover with antibiotics.  Dr. Stoney Bang of pulmonology is also aware of patient and will consult.  Will discuss with the hospitalist for admission to stepdown.   Harvest Dark, MD 04/16/19 [phone]

## 2019-04-16 NOTE — ED Notes (Signed)
Provided pt w/ ice chips

## 2019-04-16 NOTE — ED Notes (Signed)
Pt c/o of face and nose hurting from BiPap. Called RT and advised to put Bipap on standby and try pt on nasal canula. Admitting provider at bedside concurred. Pt sats 93% on [place] at this time. Will continue to monitor.

## 2019-04-16 NOTE — ED Notes (Signed)
Pt trx to CT.  

## 2019-04-16 NOTE — Consult Note (Signed)
Name: Joseph Peters MRN: [id number] DOB: 1955-12-04    ADMISSION DATE:  04/16/2019 CONSULTATION DATE: 04/16/2019  REFERRING MD : Dr. Manuella Ghazi  CHIEF COMPLAINT: Shortness of Breath   BRIEF PATIENT DESCRIPTION:  64 yo male recently discharged 02/13 following treatment acute on chronic hypoxic respiratory failure of severe COPD, cavitary MAC rt lung, and right sided moderate thoracentesis requiring readmission with the same symptoms briefly requiring Bipap    SIGNIFICANT EVENTS/STUDIES:  02/18: Pt admitted to the stepdown unit. PCCM consulted to assist with management 02/18: CTA Chest revealed no pulmonary emboli. Stable right upper lobe consolidation with air bronchograms and focal distal bronchial dilatation. Stable small amount of consolidation in the posterior left lower lobe. Stable mild right hilar adenopathy, most likely reactive. Stable marked changes of COPD. Stable aneurysmal dilatation of the ascending thoracic aorta and descending thoracic aorta with interval included aneurysmal dilatation of the infrarenal abdominal aorta, not included in its entirety, measuring 4.0 cm. Moderate-sized right pleural effusion and small to moderate-sized left pleural effusion, both increased. Calcific coronary artery and aortic atherosclerosis. Aortic Atherosclerosis (ICD10-I70.0) and Emphysema (ICD10-J43.9).  HISTORY OF PRESENT ILLNESS:   This is a 64 yo male with a PMH of Essential Tremor, Stroke, Spinal Stenosis, Sciatica, Restless Leg Syndrome, Prostatitis, Pneumonia, Neuropathy, MI, IBS, Hyperlipidemia, GERD, Frequent Falls, Depression, CAD, COPD, Coccydynia, Chest Pain with Exertion, Benign HTN, Asthma, Arthritis, Anxiety, Alcoholism, and  Abdominal Aortic Aneurysm without Rupture. He presented to [facility] ER on 02/18 with c/o shortness of breath.  He was recently discharged from [facility] on 02/13 and received treatment for severe COPD/cavitary MAC of right lung, but has not improved since discharge (details  listed below).  During current ER presentation pt arrived via EMS with respiratory rate in the 50's and O2 sats 85% on chronic O2 _0 , therefore EMS placed pt on CPAP with O2 sats increasing in the upper 90's. Upon arrival to the ER pt transitioned to [place]. CXR concerning for extensive fibrosis with superimposed interstitial edema and LLL opacity.  CTA Chest negative for pulmonary embolism, however revealed RUL consolidation with air bronchograms and focal distal bronchial dilatation, stable LLL consolidation, and moderate-sized right pleural effusion. Lab results revealed troponin 136, pct <0.10, wbc 11.7, and vbg pH 7.38/pCO2 43/bicarb 25.4.  He received cefepime, duoneb x1 dose, solumedrol, vancomycin, and 2g of magnesium. Pt later transitioned off Bipap to 4L O2 via nasal canula with O2 sats in the mid 90's/respiratory rate low 20's. He was subsequently admitted to the stepdown unit per hospitalist team for additional workup and treatment.    Per Infectious Disease telemedicine note 04/14/2019 Pt has severe COPD and cavitary MAC rt lung He was recently admitted to hospital with increasing SOB on 04/07/19 and was discharge don 04/11/19 with home oxygen Malignancy ruled out recently by bronch/biopsy He complained of cough since discharge that has resulted in nausea/vomiting.  He also endorsed loose stools. He had the same symptoms 02/2019 when MAC treatment initiated and had to stop the medications.   ID recommendations 02/16: start the antibiotic one at a time and see whether he can tolerate it- started azithromycin 02/17, and then planned to add rifampin after 2-3 days and then ethambutol- with plans for follow-up visit in 2 weeks. 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  has a past surgical history that includes Knee surgery; Cardiac catheterization (2012); Shoulder arthroscopy with subacromial decompression, rotator cuff repair and bicep tendon repair (Left, 05/10/2016); Colonoscopy with propofol (N/A, 11/14/2016); Esophagogastroduodenoscopy (egd) with propofol (N/A, 11/14/2016); Shoulder arthroscopy with open rotator cuff repair (Right, 08/20/2017); Total hip arthroplasty (Right, 07/01/2018); Joint replacement (Right); Video bronchoscopy with endobronchial navigation (N/A, 02/13/2019); Video bronchoscopy with endobronchial ultrasound (N/A, 02/13/2019); Video bronchoscopy with endobronchial navigation (N/A, 03/13/2019); and Video bronchoscopy with endobronchial ultrasound (N/A, 03/13/2019). Prior to Admission medications   Medication Sig Start Date End Date Taking? Authorizing Provider  albuterol (PROVENTIL HFA;VENTOLIN HFA) 108 (90 Base) MCG/ACT inhaler Inhale 2 puffs into the lungs every 4 (four) hours as needed for wheezing or shortness of breath.    Yes [provider]  azithromycin (ZITHROMAX) 500 MG tablet Take 1 tablet (500 mg total) by mouth daily. 04/11/19  Yes Wyvonnia Dusky, MD  benzonatate (TESSALON) 200 MG capsule Take 1 capsule (200 mg total) by mouth 3 (three) times daily as needed for up to 7 days for cough. 04/11/19 04/18/19 Yes Wyvonnia Dusky, MD  DALIRESP 250 MCG TABS Take 250 mcg by mouth at bedtime. 10/17/18  Yes  Ojie, Jude, MD  ethambutol (MYAMBUTOL) 400 MG tablet Take 2 tablets (800 mg total) by mouth daily. 04/11/19  Yes Wyvonnia Dusky, MD  finasteride (PROSCAR) 5 MG tablet Take 1 tablet (5 mg total) by mouth daily. 03/02/19  Yes Festus Aloe, MD  FLUoxetine (PROZAC) 20 MG capsule Take 20 mg by mouth daily.    Yes [provider]  Fluticasone-Umeclidin-Vilant (TRELEGY ELLIPTA) 100-62.5-25 MCG/INH AEPB Inhale 2 puffs into the lungs at bedtime.  08/18/18  Yes [provider]  gabapentin (NEURONTIN) 300 MG capsule Take 900 mg by mouth at bedtime.    Yes [provider]  losartan (COZAAR) 25 MG tablet Take 25 mg by mouth daily.    Yes [provider]  metoprolol succinate (TOPROL-XL) 25 MG 24 hr tablet Take 1 tablet (25 mg total) by mouth daily. 04/12/19 05/12/19 Yes Wyvonnia Dusky, MD  montelukast (SINGULAIR) 10 MG tablet Take 10 mg by mouth at bedtime.   Yes [provider]  omeprazole (PRILOSEC) 20 MG capsule Take 20 mg by mouth daily.   Yes [provider]  ondansetron (ZOFRAN ODT) 4 MG disintegrating tablet Take 1 tablet (4 mg total) by mouth every 8 (eight) hours as needed for nausea or vomiting. 04/02/19  Yes Tsosie Billing, MD  Oxycodone HCl 10 MG TABS Take 10 mg by mouth 4 (four) times daily as needed for pain. 09/29/18  Yes [provider]  predniSONE (DELTASONE) 20 MG tablet Take 2 tablets (40 mg total) by mouth daily for 5 days. 04/11/19 04/16/19 Yes Wyvonnia Dusky, MD  rifampin (RIFADIN) 300 MG capsule Take 2 capsules (600 mg total) by mouth daily. 04/12/19 05/12/19 Yes Wyvonnia Dusky, MD  rosuvastatin (CRESTOR) 5 MG tablet Take 5 mg by mouth at bedtime.   Yes [provider]  tamsulosin (FLOMAX) 0.4 MG CAPS capsule Take 1 capsule (0.4 mg total) by mouth daily after supper. 12/22/18  Yes Festus Aloe, MD  Allergies  Allergen Reactions  . Penicillins Swelling, Rash and Other (See Comments)     Swelling in throat  Did it involve swelling of the face/tongue/throat, SOB, or low BP? yes Did it involve sudden or severe rash/hives, skin peeling, or any reaction on the inside of your mouth or nose? no Did you need to seek medical attention at a hospital or doctor's office? Yes When did it last happen?years  If all above answers are "NO", may proceed with cephalosporin use.    . Lidocaine Other (See Comments)    Unsure  At dentist's office    FAMILY HISTORY:  family history includes COPD (age of onset: 72) in his father; Cancer in his mother. SOCIAL HISTORY:  reports that he has been smoking cigarettes. He started smoking about 46 years ago. He has a 15.00 pack-year smoking history. He quit smokeless tobacco use about 21 years ago. He reports current alcohol use of about 20.0 standard drinks of alcohol per week. He reports current drug use.  REVIEW OF SYSTEMS: Positives in BOLD  Constitutional: Negative for fever, chills, weight loss, malaise/fatigue and diaphoresis.  HENT: Negative for hearing loss, ear pain, nosebleeds, congestion, sore throat, neck pain, tinnitus and ear discharge.   Eyes: Negative for blurred vision, double vision, photophobia, pain, discharge and redness.  Respiratory: cough, hemoptysis, sputum production, shortness of breath, wheezing and stridor.   Cardiovascular: pleuritic chest pain, palpitations, orthopnea, claudication, leg swelling and PND.  Gastrointestinal: heartburn, nausea, vomiting, abdominal pain, diarrhea, constipation, blood in stool and melena.  Genitourinary: Negative for dysuria, urgency, frequency, hematuria and flank pain.  Musculoskeletal: Negative for myalgias, back pain, joint pain and falls.  Skin: Negative for itching and rash.  Neurological: Negative for dizziness, tingling, tremors, sensory change, speech change, focal weakness, seizures, loss of consciousness, weakness and headaches.  Endo/Heme/Allergies: Negative for  environmental allergies and polydipsia. Does not bruise/bleed easily.  SUBJECTIVE:  c/o of pleuritic chest pain worse with coughing and deep breathing   VITAL SIGNS: Temp:  [97.8 F (36.6 C)] 97.8 F (36.6 C) (02/18 1439) Pulse Rate:  [109-125] 111 (02/18 2000) Resp:  [17-37] 23 (02/18 2000) BP: (126-160)/(91-121) 148/110 (02/18 2000) SpO2:  [85 %-100 %] 93 % (02/18 2000) FiO2 (%):  [40 %] 40 % (02/18 1630) Weight:  [54.3 kg] 54.3 kg (02/18 1415)  PHYSICAL EXAMINATION: General: chronically ill appearing male, NAD on nasal canula  Neuro: alert and oriented, follows commands  HEENT: supple, no JVD  Cardiovascular: nsr, rrr, no R/G  Lungs: diminished with faint wheezes throughout, even, non labored Abdomen: +BS x4, soft, non tender, non distended Musculoskeletal: normal bulk and tone, no edema  Skin: intact no rashes or lesions present  Recent Labs  Lab 04/10/19 0647 04/11/19 0502 04/16/19 1418  NA 134* 135 133*  K 4.5 4.7 5.1  CL 104 104 99  CO2 _0 BUN 24* 24* 13  CREATININE 1.34* 1.25* 1.20  GLUCOSE 105* 117* 152*   Recent Labs  Lab 04/10/19 0647 04/11/19 0502 04/16/19 1418  HGB 12.1* 11.5* 14.6  HCT 36.1* 34.8* 44.2  WBC 9.5 6.8 11.7*  PLT 260 237 230   CT Angio Chest PE W and/or Wo Contrast  Result Date: 04/16/2019 CLINICAL DATA:  Shortness of breath and hypoxia EXAM: CT ANGIOGRAPHY CHEST WITH CONTRAST TECHNIQUE: Multidetector CT imaging of the chest was performed using the standard protocol during bolus administration of intravenous contrast. Multiplanar CT image reconstructions and MIPs were obtained to evaluate the vascular anatomy.  CONTRAST:  21m OMNIPAQUE IOHEXOL 350 MG/ML SOLN COMPARISON:  04/07/2019. FINDINGS: Cardiovascular: Atheromatous calcifications, including the coronary arteries and aorta. Normally opacified pulmonary arteries with no pulmonary arterial filling defects seen. The ascending thoracic aorta measures 4.5 cm in maximum diameter,  previously 4.6 cm. Aneurysmal dilatation of the descending thoracic aorta with a maximum diameter of 3.5 cm, previously 3.8 cm. Aneurysmal dilatation of the infrarenal abdominal aorta with a maximum diameter of 4.0 cm on the included portions. Mediastinum/Nodes: Mildly enlarged right hilar lymph nodes with little change. The largest measures 1.1 cm in short axis diameter on image number 55 series 4. Thyroid gland, trachea, and esophagus demonstrate no significant findings. Lungs/Pleura: Stable marked bilateral centrilobular bullous changes. Previously demonstrated right upper lobe consolidation with air bronchograms and focal distal bronchial dilatation without significant change. Small amount of consolidation in the posterior left lower lobe with little change. Moderate-sized right pleural effusion and small to moderate-sized left pleural effusion, both increased. Upper Abdomen: Dilated in for abdominal aorta as described above, not included in its entirety. Musculoskeletal: Stable 20% inferior endplate compression deformity of a midthoracic vertebral body. Review of the MIP images confirms the above findings. IMPRESSION: 1. No pulmonary emboli. 2. Stable right upper lobe consolidation with air bronchograms and focal distal bronchial dilatation. 3. Stable small amount of consolidation in the posterior left lower lobe. 4. Stable mild right hilar adenopathy, most likely reactive. 5. Stable marked changes of COPD. 6. Stable aneurysmal dilatation of the ascending thoracic aorta and descending thoracic aorta with interval included aneurysmal dilatation of the infrarenal abdominal aorta, not included in its entirety, measuring 4.0 cm. 7. Moderate-sized right pleural effusion and small to moderate-sized left pleural effusion, both increased. 8. Calcific coronary artery and aortic atherosclerosis. Aortic Atherosclerosis (ICD10-I70.0) and Emphysema (ICD10-J43.9). Electronically Signed   By: SClaudie ReveringM.D.   On:  04/16/2019 16:28   DG Chest Portable 1 View  Result Date: 04/16/2019 CLINICAL DATA:  Shortness of breath EXAM: PORTABLE CHEST 1 VIEW COMPARISON:  April 08, 2019 chest radiograph and chest CT Jul 05 2019 FINDINGS: Extensive fibrosis is noted throughout the lungs bilaterally. There may be a degree of superimposed interstitial edema given increase in interstitial thickening compared to recent CT examination. There is equivocal increase in interstitial thickening bilaterally compared to most recent chest radiograph. There may be patchy airspace opacity intermingled with interstitial prominence. There remains extensive consolidation in the right upper lobe. Heart size and pulmonary vascularity are stable and within normal limits given the underlying fibrosis. No adenopathy is appreciable by radiography. No bone lesions. IMPRESSION: Persistent extensive fibrosis with suspected superimposed interstitial edema and possible interspersed airspace opacity. The degree of ill-defined opacity, primarily in the left lower lobe is modestly increased compared to most recent chest radiograph and is definitely increased compared to most recent CT. Consolidation remains in the right upper lobe. Stable cardiac silhouette. Electronically Signed   By: WLowella GripIII M.D.   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 Delaine Lame prior to presenting to the ER and he was instructed to stop ALL antibiotic therapy for now; also will discontinue rifampin/ethambutol due to previous medication intolerance; infectious disease consulted and managing pt in the outpatient setting, will defer additional antibiotic recommendations to Dr. Delaine Lame  Elevated troponin likely demand ischemia in setting of acute on chronic respiratory failure  Continuous telemetry monitoring  Trend troponin's    Marda Stalker, [place] Pager [phone] (please enter 7 digits) PCCM Consult Pager [phone] (please enter 7 digits)

## 2019-04-16 NOTE — Progress Notes (Signed)
Pt was transported to CT from the ED and back while on the bipap.

## 2019-04-16 NOTE — ED Notes (Signed)
Provided pt w/ a cup of ice and another pillow.

## 2019-04-16 NOTE — ED Provider Notes (Signed)
[facility] Emergency Department Provider Note  ____________________________________________   None    (approximate)  I have reviewed the triage vital signs and the nursing notes.   HISTORY  Chief Complaint Shortness of Breath    HPI Joseph Peters is a 64 y.o. male with past medical history as below including COPD, MAI infection, recent admission for respiratory failure, here with shortness of breath.  History limited due to severe increased work of breathing on arrival.  Per report, patient has not felt well since discharge.  He has been try to take his medications but has had difficulty tolerating them due to vomiting.  He said progressively worsening sharp, stabbing, left-sided chest pain, though it is also present on the right.  Has had worsening shortness of breath.  Per EMS report, they were called out to the scene due to shortness of breath.  Was found in his house, severely tachypneic in the 50s to 60s.  He was placed on BiPAP with significant improvement.  Since then, has had ongoing shortness of breath.  He states he does feel somewhat better since starting BiPAP.  He does not recall any fevers.  His pain is improving on BiPAP as well.        Past Medical History:  Diagnosis Date  . AAA (abdominal aortic aneurysm) without rupture ([place])   . Abdominal aneurysm ([place]) 2015   being followed but not large enough to surgically treat  . Alcoholism ([place])   . Anxiety   . Arthritis   . Asthma   . Benign hypertension 07/16/2014  . Chest pain on exertion 07/16/2014  . Chronic pain syndrome   . Coccydynia 02/08/2012  . COPD (chronic obstructive pulmonary disease) ([place])    patient smokes  . Coronary artery disease   . 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 Depression   . 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 Dyspnea   . Frequent falls   . GERD (gastroesophageal reflux disease)   . Hyperlipidemia   . IBS  (irritable bowel syndrome)   . Myocardial infarction ([place]) 2010  . Neuropathy   . Pneumonia   . Prostatitis   . Restless leg syndrome   . Sciatica   . Spinal stenosis of lumbar region   . Stroke ([place]) 2015   no residual symptoms  . Tremor, essential     Patient Active Problem List   Diagnosis Date Noted  . Restless legs syndrome (RLS) 04/01/2019  . Protein-calorie malnutrition, severe 10/15/2018  . Nodule of upper lobe of right lung 10/15/2018  . RLQ abdominal pain 10/14/2018  . Status post total hip replacement, right 07/01/2018  . Strain of right knee 06/26/2018  . Lumbar strain 06/16/2018  . Strain of right hip 06/16/2018  . Hyperlipidemia 01/30/2018  . Rotator cuff tendinitis, right 08/19/2017  . Traumatic complete tear of right rotator cuff 08/19/2017  . Ascending aortic aneurysm ([place]) 08/01/2017  . Tremor, essential 05/28/2017  . Gastroesophageal reflux disease without esophagitis 12/25/2016  . Renal artery stenosis ([place]) 12/05/2015  . 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 Syncope and collapse 12/05/2015  . Essential hypertension 10/15/2015  . COPD exacerbation ([place]) 01/31/2015  . Acute respiratory failure ([place]) 01/31/2015  . Drug allergy 01/30/2015  . AAA (abdominal aortic aneurysm) ([place]) 11/23/2014  . Prostate cancer screening 11/02/2014  . Gross hematuria 11/02/2014  . Benign hypertension 07/16/2014  . 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 Disc disease with  myelopathy, 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  . Occlusion and stenosis of unspecified carotid artery 05/14/2012  . Coccydynia 02/08/2012  . 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 Gait disorder 02/08/2012  . Spinal stenosis of lumbar region 01/13/2009  . Atherosclerotic heart disease of native coronary artery without angina pectoris 06/28/2008  . Sciatica 06/28/2008  . Recurrent major depressive disorder in partial remission ([place]) 06/28/2008    Past Surgical History:  Procedure Laterality Date  .  CARDIAC CATHETERIZATION  2012  . COLONOSCOPY WITH PROPOFOL N/A 11/14/2016   Procedure: COLONOSCOPY WITH PROPOFOL;  Surgeon: Manya Silvas, MD;  Location: [facility] ENDOSCOPY;  Service: Endoscopy;  Laterality: N/A;  . ESOPHAGOGASTRODUODENOSCOPY (EGD) WITH PROPOFOL N/A 11/14/2016   Procedure: ESOPHAGOGASTRODUODENOSCOPY (EGD) WITH PROPOFOL;  Surgeon: Manya Silvas, MD;  Location: [facility] ENDOSCOPY;  Service: Endoscopy;  Laterality: N/A;  . JOINT REPLACEMENT Right    hip  . KNEE SURGERY     right  . SHOULDER ARTHROSCOPY WITH OPEN ROTATOR CUFF REPAIR Right 08/20/2017   Procedure: SHOULDER ARTHROSCOPY WITH OPEN ROTATOR CUFF REPAIR;  Surgeon: Corky Mull, MD;  Location: [facility] ORS;  Service: Orthopedics;  Laterality: Right;  . SHOULDER ARTHROSCOPY WITH SUBACROMIAL DECOMPRESSION, ROTATOR CUFF REPAIR AND BICEP TENDON REPAIR Left 05/10/2016   Procedure: SHOULDER ARTHROSCOPY WITH DEBTRIDEMENT, DECOMPRESSION, REPAIR OF MASSIVE ROTATOR CUFF TEAR AND BICEP TENODESIS;  Surgeon: Corky Mull, MD;  Location: [facility] ORS;  Service: Orthopedics;  Laterality: Left;  Massive cuff tear  . TOTAL HIP ARTHROPLASTY Right 07/01/2018   Procedure: TOTAL HIP ARTHROPLASTY - RIGHT;  Surgeon: Corky Mull, MD;  Location: [facility] ORS;  Service: Orthopedics;  Laterality: Right;  Marland Kitchen VIDEO BRONCHOSCOPY WITH ENDOBRONCHIAL NAVIGATION N/A 02/13/2019   Procedure: VIDEO BRONCHOSCOPY WITH ENDOBRONCHIAL NAVIGATION;  Surgeon: Ottie Glazier, MD;  Location: [facility] ORS;  Service: Thoracic;  Laterality: N/A;  . VIDEO BRONCHOSCOPY WITH ENDOBRONCHIAL NAVIGATION N/A 03/13/2019   Procedure: VIDEO BRONCHOSCOPY WITH ENDOBRONCHIAL NAVIGATION;  Surgeon: Ottie Glazier, MD;  Location: [facility] ORS;  Service: Thoracic;  Laterality: N/A;  . VIDEO BRONCHOSCOPY WITH ENDOBRONCHIAL ULTRASOUND N/A 02/13/2019   Procedure: VIDEO BRONCHOSCOPY WITH ENDOBRONCHIAL ULTRASOUND;  Surgeon: Ottie Glazier, MD;  Location: [facility] ORS;  Service: Thoracic;  Laterality: N/A;  . VIDEO  BRONCHOSCOPY WITH ENDOBRONCHIAL ULTRASOUND N/A 03/13/2019   Procedure: VIDEO BRONCHOSCOPY WITH ENDOBRONCHIAL ULTRASOUND;  Surgeon: Ottie Glazier, MD;  Location: [facility] ORS;  Service: Thoracic;  Laterality: N/A;    Prior to Admission medications   Medication Sig Start Date End Date Taking? Authorizing Provider  albuterol (PROVENTIL HFA;VENTOLIN HFA) 108 (90 Base) MCG/ACT inhaler Inhale 2 puffs into the lungs every 4 (four) hours as needed for wheezing or shortness of breath.     [provider]  azithromycin (ZITHROMAX) 500 MG tablet Take 1 tablet (500 mg total) by mouth daily. 04/11/19   Wyvonnia Dusky, MD  benzonatate (TESSALON) 200 MG capsule Take 1 capsule (200 mg total) by mouth 3 (three) times daily as needed for up to 7 days for cough. 04/11/19 04/18/19  Wyvonnia Dusky, MD  DALIRESP 250 MCG TABS Take 250 mcg by mouth at bedtime. 10/17/18   Stark Jock Jude, MD  ethambutol (MYAMBUTOL) 400 MG tablet Take 2 tablets (800 mg total) by mouth daily. 04/11/19   Wyvonnia Dusky, MD  finasteride (PROSCAR) 5 MG tablet Take 1 tablet (5 mg total) by mouth daily. 03/02/19   Festus Aloe, MD  FLUoxetine (PROZAC) 20 MG capsule Take 20 mg by mouth daily.     [provider]  Fluticasone-Umeclidin-Vilant (TRELEGY ELLIPTA) 100-62.5-25 MCG/INH AEPB Inhale 2 puffs into the lungs at bedtime.  08/18/18   [provider]  gabapentin (NEURONTIN) 300 MG capsule Take 900 mg by mouth at bedtime.     [provider]  losartan (COZAAR) 25 MG tablet Take 25 mg by mouth daily.     [provider]  metoprolol succinate (TOPROL-XL) 25 MG 24 hr tablet Take 1 tablet (25 mg total) by mouth daily. 04/12/19 05/12/19  Wyvonnia Dusky, MD  montelukast (SINGULAIR) 10 MG tablet Take 10 mg by mouth at bedtime.    [provider]  omeprazole (PRILOSEC) 20 MG capsule Take 20 mg by mouth daily.    [provider]  ondansetron (ZOFRAN ODT) 4 MG disintegrating tablet Take  1 tablet (4 mg total) by mouth every 8 (eight) hours as needed for nausea or vomiting. 04/02/19   Tsosie Billing, MD  Oxycodone HCl 10 MG TABS Take 10 mg by mouth 4 (four) times daily as needed for pain. 09/29/18   [provider]  predniSONE (DELTASONE) 20 MG tablet Take 2 tablets (40 mg total) by mouth daily for 5 days. 04/11/19 04/16/19  Wyvonnia Dusky, MD  rifampin (RIFADIN) 300 MG capsule Take 2 capsules (600 mg total) by mouth daily. 04/12/19 05/12/19  Wyvonnia Dusky, MD  rosuvastatin (CRESTOR) 5 MG tablet Take 5 mg by mouth at bedtime.    [provider]  tamsulosin (FLOMAX) 0.4 MG CAPS capsule Take 1 capsule (0.4 mg total) by mouth daily after supper. 12/22/18   Festus Aloe, MD    Allergies Penicillins and Lidocaine  Family History  Problem Relation Age of Onset  . Cancer Mother   . COPD Father 63       black lung    Social History Social History   Tobacco Use  . Smoking status: Light Tobacco Smoker    Packs/day: 0.50    Years: 30.00    Pack years: 15.00    Types: Cigarettes    Start date: 02/26/1973    Last attempt to quit: 09/21/2018    Years since quitting: 0.5  . Smokeless tobacco: Former Systems developer    Quit date: 02/26/1998  . Tobacco comment: smokes 2-3 cig a week 02/12/19  Substance Use Topics  . Alcohol use: Yes    Alcohol/week: 20.0 standard drinks    Types: 20 Cans of beer per week    Comment: week   . Drug use: Yes    Comment: prescribed hydrocodone    Review of Systems  Review of Systems  Unable to perform ROS: Acuity of condition  Constitutional: Positive for chills. Negative for fatigue and fever.  HENT: Negative for sore throat.   Respiratory: Positive for cough and chest tightness. Negative for shortness of breath.   Cardiovascular: Positive for chest pain.  Gastrointestinal: Positive for nausea. Negative for abdominal pain.  Genitourinary: Negative for flank pain.  Musculoskeletal: Negative for neck pain.  Skin:  Negative for rash and wound.  Allergic/Immunologic: Negative for immunocompromised state.  Neurological: Positive for weakness. Negative for numbness.  Hematological: Does not bruise/bleed easily.  All other systems reviewed and are negative.    ____________________________________________  PHYSICAL EXAM:      VITAL SIGNS: ED Triage Vitals  Enc Vitals Group     BP      Pulse      Resp      Temp      Temp src      SpO2  Weight      Height      Head Circumference      Peak Flow      Pain Score      Pain Loc      Pain Edu?      Excl. in [place]?      Physical Exam Vitals and nursing note reviewed.  Constitutional:      General: He is not in acute distress.    Appearance: He is well-developed. He is ill-appearing.  HENT:     Head: Normocephalic and atraumatic.  Eyes:     Conjunctiva/sclera: Conjunctivae normal.  Cardiovascular:     Rate and Rhythm: Regular rhythm. Tachycardia present.     Heart sounds: Normal heart sounds. No murmur. No friction rub.  Pulmonary:     Effort: Tachypnea and accessory muscle usage present. No respiratory distress.     Breath sounds: Examination of the right-upper field reveals wheezing. Examination of the left-upper field reveals wheezing. Examination of the right-middle field reveals wheezing. Examination of the left-middle field reveals wheezing. Examination of the right-lower field reveals wheezing. Examination of the left-lower field reveals wheezing. Decreased breath sounds and wheezing present. No rales.  Abdominal:     General: There is no distension.     Palpations: Abdomen is soft.     Tenderness: There is no abdominal tenderness.  Musculoskeletal:     Cervical back: Neck supple.  Skin:    General: Skin is warm.     Capillary Refill: Capillary refill takes less than 2 seconds.  Neurological:     Mental Status: He is alert and oriented to person, place, and time.     Motor: No abnormal muscle tone.        ____________________________________________   LABS (all labs ordered are listed, but only abnormal results are displayed)  Labs Reviewed  BLOOD GAS, VENOUS - Abnormal; Notable for the following components:      Result Value   pCO2, Ven 43 (*)    All other components within normal limits  CBC WITH DIFFERENTIAL/PLATELET - Abnormal; Notable for the following components:   WBC 11.7 (*)    Neutro Abs 9.4 (*)    All other components within normal limits  COMPREHENSIVE METABOLIC PANEL - Abnormal; Notable for the following components:   Sodium 133 (*)    Glucose, Bld 152 (*)    Calcium 8.8 (*)    Albumin 3.3 (*)    All other components within normal limits  BRAIN NATRIURETIC PEPTIDE - Abnormal; Notable for the following components:   B Natriuretic Peptide >4,500.0 (*)    All other components within normal limits  TROPONIN I (HIGH SENSITIVITY) - Abnormal; Notable for the following components:   Troponin I (High Sensitivity) 136 (*)    All other components within normal limits  RESPIRATORY PANEL BY RT PCR (FLU A&B, COVID)  CULTURE, BLOOD (ROUTINE X 2)  CULTURE, BLOOD (ROUTINE X 2)  PROCALCITONIN  TROPONIN I (HIGH SENSITIVITY)    ____________________________________________  EKG: Sinus tachycardia, VR 128. QRS 98, QTc 448. LAFB, borderline repol. TWI in lateral leads, possibly demand. No ST elevations. ________________________________________  RADIOLOGY All imaging, including plain films, CT scans, and ultrasounds, independently reviewed by me, and interpretations confirmed via formal radiology reads.  ED MD interpretation:   CXR: Persistent fibrosis with possible superimposed edema, new LLL opacity  Official radiology report(s): CT Angio Chest PE W and/or Wo Contrast  Result Date: 04/16/2019 CLINICAL DATA:  Shortness of breath and hypoxia EXAM:  CT ANGIOGRAPHY CHEST WITH CONTRAST TECHNIQUE: Multidetector CT imaging of the chest was performed using the standard protocol  during bolus administration of intravenous contrast. Multiplanar CT image reconstructions and MIPs were obtained to evaluate the vascular anatomy. CONTRAST:  61mL OMNIPAQUE IOHEXOL 350 MG/ML SOLN COMPARISON:  04/07/2019. FINDINGS: Cardiovascular: Atheromatous calcifications, including the coronary arteries and aorta. Normally opacified pulmonary arteries with no pulmonary arterial filling defects seen. The ascending thoracic aorta measures 4.5 cm in maximum diameter, previously 4.6 cm. Aneurysmal dilatation of the descending thoracic aorta with a maximum diameter of 3.5 cm, previously 3.8 cm. Aneurysmal dilatation of the infrarenal abdominal aorta with a maximum diameter of 4.0 cm on the included portions. Mediastinum/Nodes: Mildly enlarged right hilar lymph nodes with little change. The largest measures 1.1 cm in short axis diameter on image number 55 series 4. Thyroid gland, trachea, and esophagus demonstrate no significant findings. Lungs/Pleura: Stable marked bilateral centrilobular bullous changes. Previously demonstrated right upper lobe consolidation with air bronchograms and focal distal bronchial dilatation without significant change. Small amount of consolidation in the posterior left lower lobe with little change. Moderate-sized right pleural effusion and small to moderate-sized left pleural effusion, both increased. Upper Abdomen: Dilated in for abdominal aorta as described above, not included in its entirety. Musculoskeletal: Stable 20% inferior endplate compression deformity of a midthoracic vertebral body. Review of the MIP images confirms the above findings. IMPRESSION: 1. No pulmonary emboli. 2. Stable right upper lobe consolidation with air bronchograms and focal distal bronchial dilatation. 3. Stable small amount of consolidation in the posterior left lower lobe. 4. Stable mild right hilar adenopathy, most likely reactive. 5. Stable marked changes of COPD. 6. Stable aneurysmal dilatation of the  ascending thoracic aorta and descending thoracic aorta with interval included aneurysmal dilatation of the infrarenal abdominal aorta, not included in its entirety, measuring 4.0 cm. 7. Moderate-sized right pleural effusion and small to moderate-sized left pleural effusion, both increased. 8. Calcific coronary artery and aortic atherosclerosis. Aortic Atherosclerosis (ICD10-I70.0) and Emphysema (ICD10-J43.9). Electronically Signed   By: Claudie Revering M.D.   On: 04/16/2019 16:28   DG Chest Portable 1 View  Result Date: 04/16/2019 CLINICAL DATA:  Shortness of breath EXAM: PORTABLE CHEST 1 VIEW COMPARISON:  April 08, 2019 chest radiograph and chest CT Jul 05 2019 FINDINGS: Extensive fibrosis is noted throughout the lungs bilaterally. There may be a degree of superimposed interstitial edema given increase in interstitial thickening compared to recent CT examination. There is equivocal increase in interstitial thickening bilaterally compared to most recent chest radiograph. There may be patchy airspace opacity intermingled with interstitial prominence. There remains extensive consolidation in the right upper lobe. Heart size and pulmonary vascularity are stable and within normal limits given the underlying fibrosis. No adenopathy is appreciable by radiography. No bone lesions. IMPRESSION: Persistent extensive fibrosis with suspected superimposed interstitial edema and possible interspersed airspace opacity. The degree of ill-defined opacity, primarily in the left lower lobe is modestly increased compared to most recent chest radiograph and is definitely increased compared to most recent CT. Consolidation remains in the right upper lobe. Stable cardiac silhouette. Electronically Signed   By: Lowella Grip III M.D.   On: 04/16/2019 14:43    ____________________________________________  PROCEDURES   Procedure(s) performed (including Critical Care):  .Critical Care Performed by: Duffy Bruce,  MD Authorized by: Duffy Bruce, MD   Critical care provider statement:    Critical care time (minutes):  35   Critical care time was exclusive of:  Separately billable procedures and  treating other patients and teaching time   Critical care was necessary to treat or prevent imminent or life-threatening deterioration of the following conditions:  Cardiac failure, respiratory failure and circulatory failure   Critical care was time spent personally by me on the following activities:  Development of treatment plan with patient or surrogate, discussions with consultants, evaluation of patient's response to treatment, examination of patient, obtaining history from patient or surrogate, ordering and performing treatments and interventions, ordering and review of laboratory studies, ordering and review of radiographic studies, pulse oximetry, re-evaluation of patient's condition and review of old charts   I assumed direction of critical care for this patient from another provider in my specialty: no      ____________________________________________  INITIAL IMPRESSION / MDM / [place] / ED COURSE  As part of my medical decision making, I reviewed the following data within the [place] notes reviewed and incorporated, Old chart reviewed, Notes from prior ED visits, and [place] Controlled Substance Database       *Joseph Peters was evaluated in Emergency Department on 04/16/2019 for the symptoms described in the history of present illness. He was evaluated in the context of the global COVID-19 pandemic, which necessitated consideration that the patient might be at risk for infection with the SARS-CoV-2 virus that causes COVID-19. Institutional protocols and algorithms that pertain to the evaluation of patients at risk for COVID-19 are in a state of rapid change based on information released by regulatory bodies including the CDC and federal and state organizations.  These policies and algorithms were followed during the patient's care in the ED.  Some ED evaluations and interventions may be delayed as a result of limited staffing during the pandemic.*     Medical Decision Making: 63 year old male with severe COPD, MAI infection, recent admission, here with acute on chronic hypoxic respiratory failure.  Concern for superimposed pneumonia.  Patient with severely increased work of breathing on arrival, though he has demonstrated excellent response to BiPAP.  Blood gas fortunately shows no evidence of hypercapnia or CO2 retention.  Chest x-ray, however, is concerning for new left basilar infiltrate and worsening interstitial disease.  Will check a CT angio.  I discussed with Dr. Tama High, patient's infectious disease specialist, who is in agreement with plan for CT as well as okay with broadening antibiotics if indicated.  This should not affect his MAI infection.  Otherwise, I also discussed with Dr. Mortimer Fries of pulmonology who is aware and can consult on patient as an inpatient.  ____________________________________________  FINAL CLINICAL IMPRESSION(S) / ED DIAGNOSES  Final diagnoses:  COPD exacerbation ([place])  Acute on chronic respiratory failure with hypoxia ([place])     MEDICATIONS GIVEN DURING THIS VISIT:  Medications  sodium chloride 0.9 % bolus 1,000 mL (0 mLs Intravenous Stopped 04/16/19 1450)  ipratropium-albuterol (DUONEB) 0.5-2.5 (3) MG/3ML nebulizer solution (has no administration in time range)  vancomycin (VANCOREADY) IVPB 1250 mg/250 mL (1,250 mg Intravenous New Bag/Given 04/16/19 1551)  ceFEPIme (MAXIPIME) 2 g in sodium chloride 0.9 % 100 mL IVPB (0 g Intravenous Stopped 04/16/19 1619)  ipratropium-albuterol (DUONEB) 0.5-2.5 (3) MG/3ML nebulizer solution 3 mL (3 mLs Nebulization Given 04/16/19 1503)  methylPREDNISolone sodium succinate (SOLU-MEDROL) 125 mg/2 mL injection 60 mg (60 mg Intravenous Given 04/16/19 1433)  magnesium sulfate IVPB 2 g 50  mL (0 g Intravenous Stopped 04/16/19 1454)  fentaNYL (SUBLIMAZE) injection 50 mcg (50 mcg Intravenous Given 04/16/19 1433)  ondansetron (ZOFRAN) injection 4 mg (  4 mg Intravenous Given 04/16/19 1432)  iohexol (OMNIPAQUE) 350 MG/ML injection 75 mL (75 mLs Intravenous Contrast Given 04/16/19 1559)     ED Discharge Orders    None       Note:  This document was prepared using Dragon voice recognition software and may include unintentional dictation errors.   Duffy Bruce, MD 04/16/19 [phone]

## 2019-04-16 NOTE — ED Triage Notes (Signed)
Pt arrived from home via [facility]. Pt was DC from [facility] on Sunday for bilateral pneumonia. EMS stated pt's RR was 50 on arrival and sats were 85% on 2L at home. EMS put pt on CPAP and pt had sats 95-97%.

## 2019-04-16 NOTE — Progress Notes (Signed)
PHARMACY -  BRIEF ANTIBIOTIC NOTE   Pharmacy has received consult(s) for Vancomycin and Cefepime from an ED provider.  The patient's profile has been reviewed for ht/wt/allergies/indication/available labs.    One time order(s) placed for Vancomycin 1250mg  and Cefepime 2g x 1 dose each.  Further antibiotics/pharmacy consults should be ordered by admitting physician if indicated.                       Thank you, Pearla Dubonnet 04/16/2019  3:25 PM

## 2019-04-16 NOTE — ED Notes (Signed)
Called [facility] to let them know pt had bed assigned in ICU. [facility] stated they would move BIPAP up to pt's room in ICU.

## 2019-04-16 NOTE — H&P (Signed)
[facility] NAME: Joseph Peters    MR#:  [id number]  DATE OF BIRTH:  [birth year]  DATE OF ADMISSION:  04/16/2019  PRIMARY CARE PHYSICIAN: Barbaraann Boys, MD   REQUESTING/REFERRING PHYSICIAN: Harvest Dark, MD  CHIEF COMPLAINT:   Chief Complaint  Patient presents with  . Shortness of Breath    HISTORY OF PRESENT ILLNESS:  Joseph Peters  is a 64 y.o. male with a known history of severe COPD, MAC being admitted for worsening shortness of breath. Patient has not felt well since last discharge.  He has been trying to take his medications but has had difficulty tolerating due to constant coughing f/by vomiting. He reports pleuritic chest pain associated with worsening shortness of breath.  Per EMS report, they were called out to the scene due to shortness of breath.  Was found in his house, severely tachypneic in the 50s to 60s.  He was placed on BiPAP with significant improvement. He states he does feel somewhat better since starting BiPAP.  He does not recall any fevers.  Patient's O2 saturation on 2 L dropped as low as 84%.  Maintaining 98 to 99% saturations on BiPAP. PAST MEDICAL HISTORY:   Past Medical History:  Diagnosis Date  . AAA (abdominal aortic aneurysm) without rupture ([place])   . Abdominal aneurysm ([place]) 2015   being followed but not large enough to surgically treat  . Alcoholism ([place])   . Anxiety   . Arthritis   . Asthma   . Benign hypertension 07/16/2014  . Chest pain on exertion 07/16/2014  . Chronic pain syndrome   . Coccydynia 02/08/2012  . COPD (chronic obstructive pulmonary disease) ([place])    patient smokes  . Coronary artery disease   . 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 Depression   . 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 Dyspnea   . Frequent falls   . GERD (gastroesophageal reflux disease)   . Hyperlipidemia   . IBS (irritable bowel syndrome)   . Myocardial infarction ([place])  2010  . Neuropathy   . Pneumonia   . Prostatitis   . Restless leg syndrome   . Sciatica   . Spinal stenosis of lumbar region   . Stroke ([place]) 2015   no residual symptoms  . Tremor, essential     PAST SURGICAL HISTORY:   Past Surgical History:  Procedure Laterality Date  . CARDIAC CATHETERIZATION  2012  . COLONOSCOPY WITH PROPOFOL N/A 11/14/2016   Procedure: COLONOSCOPY WITH PROPOFOL;  Surgeon: Manya Silvas, MD;  Location[facility] ENDOSCOPY;  Service: Endoscopy;  Laterality: N/A;  . ESOPHAGOGASTRODUODENOSCOPY (EGD) WITH PROPOFOL N/A 11/14/2016   Procedure: ESOPHAGOGASTRODUODENOSCOPY (EGD) WITH PROPOFOL;  Surgeon: Manya Silvas, MD;  Location[facility] ENDOSCOPY;  Service: Endoscopy;  Laterality: N/A;  . JOINT REPLACEMENT Right    hip  . KNEE SURGERY     right  . SHOULDER ARTHROSCOPY WITH OPEN ROTATOR CUFF REPAIR Right 08/20/2017   Procedure: SHOULDER ARTHROSCOPY WITH OPEN ROTATOR CUFF REPAIR;  Surgeon: Corky Mull, MD;  Location: [facility] ORS;  Service: Orthopedics;  Laterality: Right;  . SHOULDER ARTHROSCOPY WITH SUBACROMIAL DECOMPRESSION, ROTATOR CUFF REPAIR AND BICEP TENDON REPAIR Left 05/10/2016   Procedure: SHOULDER ARTHROSCOPY WITH DEBTRIDEMENT, DECOMPRESSION, REPAIR OF MASSIVE ROTATOR CUFF TEAR AND BICEP TENODESIS;  Surgeon: Corky Mull, MD;  Location: [facility] ORS;  Service: Orthopedics;  Laterality: Left;  Massive cuff tear  . TOTAL HIP ARTHROPLASTY Right 07/01/2018  Procedure: TOTAL HIP ARTHROPLASTY - RIGHT;  Surgeon: Corky Mull, MD;  Location: [facility] ORS;  Service: Orthopedics;  Laterality: Right;  Marland Kitchen VIDEO BRONCHOSCOPY WITH ENDOBRONCHIAL NAVIGATION N/A 02/13/2019   Procedure: VIDEO BRONCHOSCOPY WITH ENDOBRONCHIAL NAVIGATION;  Surgeon: Ottie Glazier, MD;  Location: [facility] ORS;  Service: Thoracic;  Laterality: N/A;  . VIDEO BRONCHOSCOPY WITH ENDOBRONCHIAL NAVIGATION N/A 03/13/2019   Procedure: VIDEO BRONCHOSCOPY WITH ENDOBRONCHIAL NAVIGATION;  Surgeon: Ottie Glazier, MD;   Location: [facility] ORS;  Service: Thoracic;  Laterality: N/A;  . VIDEO BRONCHOSCOPY WITH ENDOBRONCHIAL ULTRASOUND N/A 02/13/2019   Procedure: VIDEO BRONCHOSCOPY WITH ENDOBRONCHIAL ULTRASOUND;  Surgeon: Ottie Glazier, MD;  Location: [facility] ORS;  Service: Thoracic;  Laterality: N/A;  . VIDEO BRONCHOSCOPY WITH ENDOBRONCHIAL ULTRASOUND N/A 03/13/2019   Procedure: VIDEO BRONCHOSCOPY WITH ENDOBRONCHIAL ULTRASOUND;  Surgeon: Ottie Glazier, MD;  Location: [facility] ORS;  Service: Thoracic;  Laterality: N/A;    SOCIAL HISTORY:   Social History   Tobacco Use  . Smoking status: Light Tobacco Smoker    Packs/day: 0.50    Years: 30.00    Pack years: 15.00    Types: Cigarettes    Start date: 02/26/1973    Last attempt to quit: 09/21/2018    Years since quitting: 0.5  . Smokeless tobacco: Former Systems developer    Quit date: 02/26/1998  . Tobacco comment: smokes 2-3 cig a week 02/12/19  Substance Use Topics  . Alcohol use: Yes    Alcohol/week: 20.0 standard drinks    Types: 20 Cans of beer per week    Comment: week     FAMILY HISTORY:   Family History  Problem Relation Age of Onset  . Cancer Mother   . COPD Father 19       black lung    DRUG ALLERGIES:   Allergies  Allergen Reactions  . Penicillins Swelling, Rash and Other (See Comments)    Swelling in throat  Did it involve swelling of the face/tongue/throat, SOB, or low BP? yes Did it involve sudden or severe rash/hives, skin peeling, or any reaction on the inside of your mouth or nose? no Did you need to seek medical attention at a hospital or doctor's office? Yes When did it last happen?years  If all above answers are "NO", may proceed with cephalosporin use.    . Lidocaine Other (See Comments)    Unsure  At dentist's office    REVIEW OF SYSTEMS:   Review of Systems  Constitutional: Positive for malaise/fatigue and weight loss. Negative for diaphoresis and fever.  HENT: Negative for ear discharge, ear pain, hearing loss, nosebleeds,  sore throat and tinnitus.   Eyes: Negative for blurred vision and pain.  Respiratory: Positive for cough and shortness of breath. Negative for hemoptysis and wheezing.   Cardiovascular: Negative for chest pain, palpitations, orthopnea and leg swelling.  Gastrointestinal: Positive for nausea and vomiting. Negative for abdominal pain, blood in stool, constipation, diarrhea and heartburn.  Genitourinary: Negative for dysuria, frequency and urgency.  Musculoskeletal: Negative for back pain and myalgias.  Skin: Negative for itching and rash.  Neurological: Negative for dizziness, tingling, tremors, focal weakness, seizures, weakness and headaches.  Psychiatric/Behavioral: Negative for depression. The patient is not nervous/anxious.    MEDICATIONS AT HOME:   Prior to Admission medications   Medication Sig Start Date End Date Taking? Authorizing Provider  albuterol (PROVENTIL HFA;VENTOLIN HFA) 108 (90 Base) MCG/ACT inhaler Inhale 2 puffs into the lungs every 4 (four) hours as needed for wheezing or shortness of breath.  [provider]  azithromycin (ZITHROMAX) 500 MG tablet Take 1 tablet (500 mg total) by mouth daily. 04/11/19   Wyvonnia Dusky, MD  benzonatate (TESSALON) 200 MG capsule Take 1 capsule (200 mg total) by mouth 3 (three) times daily as needed for up to 7 days for cough. 04/11/19 04/18/19  Wyvonnia Dusky, MD  DALIRESP 250 MCG TABS Take 250 mcg by mouth at bedtime. 10/17/18   Stark Jock Jude, MD  ethambutol (MYAMBUTOL) 400 MG tablet Take 2 tablets (800 mg total) by mouth daily. 04/11/19   Wyvonnia Dusky, MD  finasteride (PROSCAR) 5 MG tablet Take 1 tablet (5 mg total) by mouth daily. 03/02/19   Festus Aloe, MD  FLUoxetine (PROZAC) 20 MG capsule Take 20 mg by mouth daily.     [provider]  Fluticasone-Umeclidin-Vilant (TRELEGY ELLIPTA) 100-62.5-25 MCG/INH AEPB Inhale 2 puffs into the lungs at bedtime.  08/18/18   [provider]  gabapentin  (NEURONTIN) 300 MG capsule Take 900 mg by mouth at bedtime.     [provider]  losartan (COZAAR) 25 MG tablet Take 25 mg by mouth daily.     [provider]  metoprolol succinate (TOPROL-XL) 25 MG 24 hr tablet Take 1 tablet (25 mg total) by mouth daily. 04/12/19 05/12/19  Wyvonnia Dusky, MD  montelukast (SINGULAIR) 10 MG tablet Take 10 mg by mouth at bedtime.    [provider]  omeprazole (PRILOSEC) 20 MG capsule Take 20 mg by mouth daily.    [provider]  ondansetron (ZOFRAN ODT) 4 MG disintegrating tablet Take 1 tablet (4 mg total) by mouth every 8 (eight) hours as needed for nausea or vomiting. 04/02/19   Tsosie Billing, MD  Oxycodone HCl 10 MG TABS Take 10 mg by mouth 4 (four) times daily as needed for pain. 09/29/18   [provider]  predniSONE (DELTASONE) 20 MG tablet Take 2 tablets (40 mg total) by mouth daily for 5 days. 04/11/19 04/16/19  Wyvonnia Dusky, MD  rifampin (RIFADIN) 300 MG capsule Take 2 capsules (600 mg total) by mouth daily. 04/12/19 05/12/19  Wyvonnia Dusky, MD  rosuvastatin (CRESTOR) 5 MG tablet Take 5 mg by mouth at bedtime.    [provider]  tamsulosin (FLOMAX) 0.4 MG CAPS capsule Take 1 capsule (0.4 mg total) by mouth daily after supper. 12/22/18   Festus Aloe, MD      VITAL SIGNS:  Blood pressure (!) 150/109, pulse (!) 110, temperature 97.8 F (36.6 C), temperature source Axillary, resp. rate 17, height _0  (1.651 m), weight 54.3 kg, SpO2 98 %.  PHYSICAL EXAMINATION:  Physical Exam  GENERAL:  64 y.o.-year-old cachectic looking patient lying in the bed in acute resp distress.  EYES: Pupils equal, round, reactive to light and accommodation. No scleral icterus. Extraocular muscles intact.  HEENT: Head atraumatic, normocephalic. Oropharynx and nasopharynx clear.  NECK:  Supple, no jugular venous distention. No thyroid enlargement, no tenderness.  LUNGS: Decreased breath sounds  bilaterally, Bilateral rhonchorous breath sounds worse on the right and crackles at the right. Using accessory muscles of respiration.  CARDIOVASCULAR: S1, S2 normal. No murmurs, rubs, or gallops.  ABDOMEN: Soft, nontender, nondistended. Bowel sounds present. No organomegaly or mass.  EXTREMITIES: No pedal edema, cyanosis, or clubbing.  NEUROLOGIC: Cranial nerves II through XII are intact. Muscle strength 5/5 in all extremities. Sensation intact. Gait not checked.  PSYCHIATRIC: The patient is alert and oriented x 3.  SKIN: No obvious rash, lesion, or ulcer.  LABORATORY PANEL:   CBC Recent Labs  Lab 04/16/19 1418  WBC 11.7*  HGB 14.6  HCT 44.2  PLT 230   ------------------------------------------------------------------------------------------------------------------  Chemistries  Recent Labs  Lab 04/16/19 1418  NA 133*  K 5.1  CL 99  CO2 25  GLUCOSE 152*  BUN 13  CREATININE 1.20  CALCIUM 8.8*  AST 21  ALT 35  ALKPHOS 102  BILITOT 0.5   ------------------------------------------------------------------------------------------------------------------  Cardiac Enzymes No results for input(s): TROPONINI in the last 168 hours. ------------------------------------------------------------------------------------------------------------------  RADIOLOGY:  CT Angio Chest PE W and/or Wo Contrast  Result Date: 04/16/2019 CLINICAL DATA:  Shortness of breath and hypoxia EXAM: CT ANGIOGRAPHY CHEST WITH CONTRAST TECHNIQUE: Multidetector CT imaging of the chest was performed using the standard protocol during bolus administration of intravenous contrast. Multiplanar CT image reconstructions and MIPs were obtained to evaluate the vascular anatomy. CONTRAST:  56m OMNIPAQUE IOHEXOL 350 MG/ML SOLN COMPARISON:  04/07/2019. FINDINGS: Cardiovascular: Atheromatous calcifications, including the coronary arteries and aorta. Normally opacified pulmonary arteries with no pulmonary arterial  filling defects seen. The ascending thoracic aorta measures 4.5 cm in maximum diameter, previously 4.6 cm. Aneurysmal dilatation of the descending thoracic aorta with a maximum diameter of 3.5 cm, previously 3.8 cm. Aneurysmal dilatation of the infrarenal abdominal aorta with a maximum diameter of 4.0 cm on the included portions. Mediastinum/Nodes: Mildly enlarged right hilar lymph nodes with little change. The largest measures 1.1 cm in short axis diameter on image number 55 series 4. Thyroid gland, trachea, and esophagus demonstrate no significant findings. Lungs/Pleura: Stable marked bilateral centrilobular bullous changes. Previously demonstrated right upper lobe consolidation with air bronchograms and focal distal bronchial dilatation without significant change. Small amount of consolidation in the posterior left lower lobe with little change. Moderate-sized right pleural effusion and small to moderate-sized left pleural effusion, both increased. Upper Abdomen: Dilated in for abdominal aorta as described above, not included in its entirety. Musculoskeletal: Stable 20% inferior endplate compression deformity of a midthoracic vertebral body. Review of the MIP images confirms the above findings. IMPRESSION: 1. No pulmonary emboli. 2. Stable right upper lobe consolidation with air bronchograms and focal distal bronchial dilatation. 3. Stable small amount of consolidation in the posterior left lower lobe. 4. Stable mild right hilar adenopathy, most likely reactive. 5. Stable marked changes of COPD. 6. Stable aneurysmal dilatation of the ascending thoracic aorta and descending thoracic aorta with interval included aneurysmal dilatation of the infrarenal abdominal aorta, not included in its entirety, measuring 4.0 cm. 7. Moderate-sized right pleural effusion and small to moderate-sized left pleural effusion, both increased. 8. Calcific coronary artery and aortic atherosclerosis. Aortic Atherosclerosis (ICD10-I70.0)  and Emphysema (ICD10-J43.9). Electronically Signed   By: SClaudie ReveringM.D.   On: 04/16/2019 16:28   DG Chest Portable 1 View  Result Date: 04/16/2019 CLINICAL DATA:  Shortness of breath EXAM: PORTABLE CHEST 1 VIEW COMPARISON:  April 08, 2019 chest radiograph and chest CT Jul 05 2019 FINDINGS: Extensive fibrosis is noted throughout the lungs bilaterally. There may be a degree of superimposed interstitial edema given increase in interstitial thickening compared to recent CT examination. There is equivocal increase in interstitial thickening bilaterally compared to most recent chest radiograph. There may be patchy airspace opacity intermingled with interstitial prominence. There remains extensive consolidation in the right upper lobe. Heart size and pulmonary vascularity are stable and within normal limits given the underlying fibrosis. No adenopathy is appreciable by radiography. No bone lesions. IMPRESSION: Persistent extensive fibrosis with suspected superimposed interstitial edema and possible  interspersed airspace opacity. The degree of ill-defined opacity, primarily in the left lower lobe is modestly increased compared to most recent chest radiograph and is definitely increased compared to most recent CT. Consolidation remains in the right upper lobe. Stable cardiac silhouette. Electronically Signed   By: Lowella Grip III M.D.   On: 04/16/2019 14:43   IMPRESSION AND PLAN:  LORY GALAN is a 64 y.o. male with a history of advancedpulmonary fibrosis and centrilobular bullous emphysema with grade 2D COPDwith DLCO 45%, cavitary MAC infection rt lung is being admitted for acute on chronic hypoxic resp failure   * Acute on chronic hypoxic resp failure - requiring BiPAP - likely multifactorial, secondary to MAC & severe COPD/Pulmo fibrosis - DuoNebs every 6 hours & continue home inhalers  -Solu-Medrol IV - wean BiPAP to [place] as able -Aggressive bronchopulmonary hygiene protocol -PCCM c/s -  Dr Mortimer Fries aware  * Cavitary MAC Rt lung He was recently admitted to hospital with increasing SOB on 04/07/19 and was discharge don 04/11/19 with home oxygen - doubt his compliance to MAC regimen - azithromycin/rifampin/ethambutol) - had televisit with ID on 2/16 and was recommended to space out Abx and resume it. - will c/s ID - Dr Delaine Lame aware  * B/l pleural effusion: Rt > Lt  s/p right pleural thoracentesis on 04/08/19.   * HTN: continue losartan & metoprolol  * HLD: continue on statin  * Thoracic aortic aneurysm: CTA chest is neg for PE but shows and ascending thoracic aortic aneurysm 4.5 x 4.6 cmand also aneurysmal dilatation of descending thoracic aorta without evidence of dissection. Vascular surg previously knew about aneurysm unable to repair until MAC infection is completely resolved as a high mortality rate w/ infection of graft as per vascular surg   Looks critically sick and high risk for cardio-pulmo failure and may need intubation if resp status worsens  Overall poor prognosis. Admit to [facility]   All the records are reviewed and case discussed with ED provider. Management plans discussed with the patient, nursing and they are in agreement.  CODE STATUS: FULL CODE  TOTAL TIME (Critical Care) TAKING CARE OF THIS PATIENT: 45 minutes.    Max Sane M.D on 04/16/2019 at 5:22 PM  [facility]   CC: Primary care physician; Barbaraann Boys, MD   Note: This dictation was prepared with Dragon dictation along with smaller phrase technology. Any transcriptional errors that result from this process are unintentional.

## 2019-04-16 NOTE — ED Notes (Signed)
Date and time results received: 04/16/19 3:02 PM (use smartphrase ".now" to insert current time)  Test: troponin Critical Value: 136  Name of Provider Notified: Dr Ellender Hose  Orders Received? Or Actions Taken?: no new orders at this time

## 2019-04-17 ENCOUNTER — Inpatient Hospital Stay: Payer: Medicare Other

## 2019-04-17 DIAGNOSIS — Z96641 Presence of right artificial hip joint: Secondary | ICD-10-CM

## 2019-04-17 DIAGNOSIS — J90 Pleural effusion, not elsewhere classified: Secondary | ICD-10-CM

## 2019-04-17 DIAGNOSIS — A310 Pulmonary mycobacterial infection: Secondary | ICD-10-CM

## 2019-04-17 DIAGNOSIS — Z880 Allergy status to penicillin: Secondary | ICD-10-CM

## 2019-04-17 DIAGNOSIS — Z884 Allergy status to anesthetic agent status: Secondary | ICD-10-CM

## 2019-04-17 DIAGNOSIS — Z96612 Presence of left artificial shoulder joint: Secondary | ICD-10-CM

## 2019-04-17 DIAGNOSIS — J209 Acute bronchitis, unspecified: Secondary | ICD-10-CM

## 2019-04-17 DIAGNOSIS — J439 Emphysema, unspecified: Secondary | ICD-10-CM

## 2019-04-17 DIAGNOSIS — Z720 Tobacco use: Secondary | ICD-10-CM

## 2019-04-17 DIAGNOSIS — Z96611 Presence of right artificial shoulder joint: Secondary | ICD-10-CM

## 2019-04-17 DIAGNOSIS — J440 Chronic obstructive pulmonary disease with acute lower respiratory infection: Secondary | ICD-10-CM

## 2019-04-17 DIAGNOSIS — I714 Abdominal aortic aneurysm, without rupture: Secondary | ICD-10-CM

## 2019-04-17 DIAGNOSIS — J9601 Acute respiratory failure with hypoxia: Secondary | ICD-10-CM

## 2019-04-17 LAB — COMPREHENSIVE METABOLIC PANEL
ALT: 26 U/L (ref 0–44)
AST: 14 U/L — ABNORMAL LOW (ref 15–41)
Albumin: 2.6 g/dL — ABNORMAL LOW (ref 3.5–5.0)
Alkaline Phosphatase: 75 U/L (ref 38–126)
Anion gap: 10 (ref 5–15)
BUN: 16 mg/dL (ref 8–23)
CO2: 20 mmol/L — ABNORMAL LOW (ref 22–32)
Calcium: 8.2 mg/dL — ABNORMAL LOW (ref 8.9–10.3)
Chloride: 103 mmol/L (ref 98–111)
Creatinine, Ser: 1.04 mg/dL (ref 0.61–1.24)
GFR calc Af Amer: >60 mL/min (ref >60)
GFR calc non Af Amer: >60 mL/min (ref >60)
Glucose, Bld: 138 mg/dL — ABNORMAL HIGH (ref 70–99)
Potassium: 4.8 mmol/L (ref 3.5–5.1)
Sodium: 133 mmol/L — ABNORMAL LOW (ref 135–145)
Total Bilirubin: 0.7 mg/dL (ref 0.3–1.2)
Total Protein: 5.9 g/dL — ABNORMAL LOW (ref 6.5–8.1)

## 2019-04-17 LAB — TROPONIN I (HIGH SENSITIVITY): Troponin I (High Sensitivity): 79 ng/L — ABNORMAL HIGH (ref <18)

## 2019-04-17 LAB — MRSA PCR SCREENING: MRSA by PCR: NEGATIVE

## 2019-04-17 LAB — PHOSPHORUS: Phosphorus: 4.3 mg/dL (ref 2.5–4.6)

## 2019-04-17 LAB — MAGNESIUM: Magnesium: 2.4 mg/dL (ref 1.7–2.4)

## 2019-04-17 LAB — HIV ANTIBODY (ROUTINE TESTING W REFLEX): HIV Screen 4th Generation wRfx: NONREACTIVE

## 2019-04-17 MED ORDER — ADULT MULTIVITAMIN W/MINERALS CH
1.0000 | ORAL_TABLET | Freq: Every day | ORAL | Status: DC
  Administered 2019-04-18 – 2019-04-23 (×6): 1 via ORAL
  Filled 2019-04-17 (×6): qty 1

## 2019-04-17 MED ORDER — AZITHROMYCIN 500 MG PO TABS
500.0000 mg | ORAL_TABLET | Freq: Every day | ORAL | Status: DC
  Administered 2019-04-17: 500 mg via ORAL
  Filled 2019-04-17 (×2): qty 1

## 2019-04-17 MED ORDER — ENSURE ENLIVE PO LIQD
237.0000 mL | Freq: Three times a day (TID) | ORAL | Status: DC
  Administered 2019-04-17 – 2019-04-23 (×15): 237 mL via ORAL

## 2019-04-17 MED ORDER — ETHAMBUTOL HCL 400 MG PO TABS
400.0000 mg | ORAL_TABLET | Freq: Two times a day (BID) | ORAL | Status: DC
  Administered 2019-04-17: 400 mg via ORAL
  Filled 2019-04-17 (×2): qty 1

## 2019-04-17 MED ORDER — ETHAMBUTOL HCL 400 MG PO TABS
400.0000 mg | ORAL_TABLET | Freq: Two times a day (BID) | ORAL | Status: DC
  Filled 2019-04-17: qty 1

## 2019-04-17 MED ORDER — CHLOROPROCAINE HCL 2 % IJ SOLN
10.0000 mL | Freq: Once | INTRAMUSCULAR | Status: DC
  Filled 2019-04-17: qty 30

## 2019-04-17 MED ORDER — AZITHROMYCIN 500 MG PO TABS: 500.0000 mg | ORAL_TABLET | Freq: Every day | ORAL | Status: DC

## 2019-04-17 MED ORDER — ETHAMBUTOL HCL 400 MG PO TABS
400.0000 mg | ORAL_TABLET | Freq: Two times a day (BID) | ORAL | Status: DC
  Administered 2019-04-17 – 2019-04-23 (×12): 400 mg via ORAL
  Filled 2019-04-17 (×13): qty 1

## 2019-04-17 MED ORDER — RIFAMPIN 300 MG PO CAPS
300.0000 mg | ORAL_CAPSULE | Freq: Two times a day (BID) | ORAL | Status: DC
  Filled 2019-04-17: qty 1

## 2019-04-17 MED ORDER — RIFAMPIN 300 MG PO CAPS
300.0000 mg | ORAL_CAPSULE | Freq: Two times a day (BID) | ORAL | Status: DC
  Filled 2019-04-17 (×3): qty 1

## 2019-04-17 MED ORDER — AZITHROMYCIN 500 MG PO TABS
500.0000 mg | ORAL_TABLET | Freq: Every day | ORAL | Status: DC
  Administered 2019-04-18 – 2019-04-23 (×6): 500 mg via ORAL
  Filled 2019-04-17 (×6): qty 1

## 2019-04-17 MED ORDER — RIFAMPIN 300 MG PO CAPS
300.0000 mg | ORAL_CAPSULE | Freq: Two times a day (BID) | ORAL | Status: DC
  Administered 2019-04-18 – 2019-04-23 (×11): 300 mg via ORAL
  Filled 2019-04-17 (×12): qty 1

## 2019-04-17 NOTE — Progress Notes (Signed)
   04/17/19 1300  Clinical Encounter Type  Visited With Patient  Visit Type Initial  Referral From Chaplain  Consult/Referral To [place] visited with patient and he expressed feeling of dissatisfaction and discomfort. I s/w Nurse [facility] and she went in to find out patient's concerns. Chaplain told patient that there is a Clinical biochemist on duty 24 hours and someone will check in on him. Patient was grateful to hear that, saying that he always needs prayer. As Chaplain left, patient said God bless you. Chaplain offered pastoral presence and empathy.

## 2019-04-17 NOTE — Consult Note (Signed)
NAME: Joseph Peters  DOB: 02/28/1955  MRN: [id number]  Date/Time: 04/17/2019 3:11 PM  REQUESTING PROVIDER: Dr.SHAH Subjective:  REASON FOR CONSULT: COPD/emphysema MAC lung  Pt was in the hospital last week and was discharged after 3 days for acute hypoxia- is readmitted with severe SOB ? Joseph Peters is a 64 y.o. male with a history of COPD, cavitary MAC infection rt lung was admitted with increasing SOB Pt is known to me . He has multiple comorbidities. He was started on Triple MAC therapy with azithromycin, rifampin and ethambutol on 03/24/19. On 04/02/19 his meds were stopped due  severe vomiting, some diarrhea. He got admitted to the hospital on 04/07/19 with severe SOB - was found to have COPD exacerbation and also treatment for MAC restarted- He tolerated all three antibiotics in the hospital  With zofran- we tried IV amikacin because of cavitary mac but the creatinine started to climb. He was dc on 2/13 on home oxygen He had incessant cough at home which caused him to vomit and also he was having diarrhea. so his meds were on hold 2 days ago and the plan was to restart them one at a time and space them out. I had spoken to him and his friend on Tuesday. He came to the ED because on increasing SOB     Past Medical History:  Diagnosis Date  . AAA (abdominal aortic aneurysm) without rupture ([place])   . Abdominal aneurysm ([place]) 2015   being followed but not large enough to surgically treat  . Alcoholism ([place])   . Anxiety   . Arthritis   . Asthma   . Benign hypertension 07/16/2014  . Chest pain on exertion 07/16/2014  . Chronic pain syndrome   . Coccydynia 02/08/2012  . COPD (chronic obstructive pulmonary disease) ([place])    patient smokes  . Coronary artery disease   . 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 Depression   . 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 Dyspnea   . Frequent falls   . GERD (gastroesophageal reflux disease)   .  Hyperlipidemia   . IBS (irritable bowel syndrome)   . Myocardial infarction ([place]) 2010  . Neuropathy   . Pneumonia   . Prostatitis   . Restless leg syndrome   . Sciatica   . Spinal stenosis of lumbar region   . Stroke ([place]) 2015   no residual symptoms  . Tremor, essential     Past Surgical History:  Procedure Laterality Date  . CARDIAC CATHETERIZATION  2012  . COLONOSCOPY WITH PROPOFOL N/A 11/14/2016   Procedure: COLONOSCOPY WITH PROPOFOL;  Surgeon: Manya Silvas, MD;  Location: [facility] ENDOSCOPY;  Service: Endoscopy;  Laterality: N/A;  . ESOPHAGOGASTRODUODENOSCOPY (EGD) WITH PROPOFOL N/A 11/14/2016   Procedure: ESOPHAGOGASTRODUODENOSCOPY (EGD) WITH PROPOFOL;  Surgeon: Manya Silvas, MD;  Location: [facility] END[facility]COPY;  Service: Endoscopy;  Laterality: N/A;  . JOINT REPLACEMENT Right    hip  . KNEE SURGERY     right  . SHOULDER ARTHROSCOPY WITH OPEN ROTATOR CUFF REPAIR Right 08/20/2017   Procedure: SHOULDER ARTHROSCOPY WITH OPEN ROTATOR CUFF REPAIR;  Surgeon: Corky Mull, MD;  Location: [facility] ORS;  Service: Orthopedics;  Laterality: Right;  . SHOULDER ARTHROSCOPY WITH SUBACROMIAL DECOMPRESSION, ROTATOR CUFF REPAIR AND BICEP TENDON REPAIR Left 05/10/2016   Procedure: SHOULDER ARTHROSCOPY WITH DEBTRIDEMENT, DECOMPRESSION, REPAIR OF MASSIVE ROTATOR CUFF TEAR AND BICEP TENODESIS;  Surgeon: Corky Mull, MD;  Location: [facility] ORS;  Service:  Orthopedics;  Laterality: Left;  Massive cuff tear  . TOTAL HIP ARTHROPLASTY Right 07/01/2018   Procedure: TOTAL HIP ARTHROPLASTY - RIGHT;  Surgeon: Corky Mull, MD;  Location: [facility] ORS;  Service: Orthopedics;  Laterality: Right;  Marland Kitchen VIDEO BRONCHOSCOPY WITH ENDOBRONCHIAL NAVIGATION N/A 02/13/2019   Procedure: VIDEO BRONCHOSCOPY WITH ENDOBRONCHIAL NAVIGATION;  Surgeon: Ottie Glazier, MD;  Location: [facility] ORS;  Service: Thoracic;  Laterality: N/A;  . VIDEO BRONCHOSCOPY WITH ENDOBRONCHIAL NAVIGATION N/A 03/13/2019   Procedure: VIDEO BRONCHOSCOPY WITH  ENDOBRONCHIAL NAVIGATION;  Surgeon: Ottie Glazier, MD;  Location: [facility] ORS;  Service: Thoracic;  Laterality: N/A;  . VIDEO BRONCHOSCOPY WITH ENDOBRONCHIAL ULTRASOUND N/A 02/13/2019   Procedure: VIDEO BRONCHOSCOPY WITH ENDOBRONCHIAL ULTRASOUND;  Surgeon: Ottie Glazier, MD;  Location: [facility] ORS;  Service: Thoracic;  Laterality: N/A;  . VIDEO BRONCHOSCOPY WITH ENDOBRONCHIAL ULTRASOUND N/A 03/13/2019   Procedure: VIDEO BRONCHOSCOPY WITH ENDOBRONCHIAL ULTRASOUND;  Surgeon: Ottie Glazier, MD;  Location: [facility] ORS;  Service: Thoracic;  Laterality: N/A;    Social History   Socioeconomic History  . Marital status: Married    Spouse name: Not on file  . Number of children: Not on file  . Years of education: Not on file  . Highest education level: Not on file  Occupational History  . Not on file  Tobacco Use  . Smoking status: Light Tobacco Smoker    Packs/day: 0.50    Years: 30.00    Pack years: 15.00    Types: Cigarettes    Start date: 02/26/1973    Last attempt to quit: 09/21/2018    Years since quitting: 0.5  . Smokeless tobacco: Former Systems developer    Quit date: 02/26/1998  . Tobacco comment: smokes 2-3 cig a week 02/12/19  Substance and Sexual Activity  . Alcohol use: Yes    Alcohol/week: 20.0 standard drinks    Types: 20 Cans of beer per week    Comment: week   . Drug use: Yes    Comment: prescribed hydrocodone  . Sexual activity: Not on file  Other Topics Concern  . Not on file  Social History Narrative   Patient is a Nature conservation officer.;  He lives with his wife at home.  Longstanding history of smoking; quit recently.  Denies alcohol abuse.   Social Determinants of Health   Financial Resource Strain:   . Difficulty of Paying Living Expenses: Not on file  Food Insecurity:   . Worried About Charity fundraiser in the Last Year: Not on file  . Ran Out of Food in the Last Year: Not on file  Transportation Needs:   . Lack of Transportation (Medical): Not on file  . Lack of  Transportation (Non-Medical): Not on file  Physical Activity:   . Days of Exercise per Week: Not on file  . Minutes of Exercise per Session: Not on file  Stress:   . Feeling of Stress : Not on file  Social Connections:   . Frequency of Communication with Friends and Family: Not on file  . Frequency of Social Gatherings with Friends and Family: Not on file  . Attends Religious Services: Not on file  . Active Member of Clubs or Organizations: Not on file  . Attends Archivist Meetings: Not on file  . Marital Status: Not on file  Intimate Partner Violence:   . Fear of Current or Ex-Partner: Not on file  . Emotionally Abused: Not on file  . Physically Abused: Not on file  . Sexually Abused: Not on file  Family History  Problem Relation Age of Onset  . Cancer Mother   . COPD Father 28       black lung   Allergies  Allergen Reactions  . Penicillins Swelling, Rash and Other (See Comments)    Swelling in throat  Did it involve swelling of the face/tongue/throat, SOB, or low BP? yes Did it involve sudden or severe rash/hives, skin peeling, or any reaction on the inside of your mouth or nose? no Did you need to seek medical attention at a hospital or doctor's office? Yes When did it last happen?years  If all above answers are "NO", may proceed with cephalosporin use.    . Lidocaine Other (See Comments)    Unsure  At dentist's office   ? Current Facility-Administered Medications  Medication Dose Route Frequency Provider Last Rate Last Admin  . benzonatate (TESSALON) capsule 200 mg  200 mg Oral TID PRN Max Sane, MD   200 mg at 04/17/19 0120  . budesonide (PULMICORT) nebulizer solution 0.5 mg  0.5 mg Nebulization BID Awilda Bill, NP   0.5 mg at 04/17/19 0827  . Chlorhexidine Gluconate Cloth 2 % PADS 6 each  6 each Topical Daily Max Sane, MD   6 each at 04/17/19 1025  . chloroprocaine (NESACAINE-MPF) 2 % injection 10 mL  10 mL Infiltration Once Docia Barrier, PA      . chlorpheniramine-HYDROcodone (TUSSIONEX) 10-8 MG/5ML suspension 5 mL  5 mL Oral Q12H PRN Awilda Bill, NP   5 mL at 04/16/19 2251  . enoxaparin (LOVENOX) injection 40 mg  40 mg Subcutaneous Q24H Awilda Bill, NP      . feeding supplement (ENSURE ENLIVE) (ENSURE ENLIVE) liquid 237 mL  237 mL Oral TID BM Max Sane, MD   237 mL at 04/17/19 1425  . finasteride (PROSCAR) tablet 5 mg  5 mg Oral Daily Max Sane, MD   5 mg at 04/17/19 1025  . FLUoxetine (PROZAC) capsule 20 mg  20 mg Oral Daily Max Sane, MD   20 mg at 04/17/19 1026  . gabapentin (NEURONTIN) capsule 900 mg  900 mg Oral QHS Max Sane, MD   900 mg at 04/16/19 2236  . HYDROcodone-acetaminophen (NORCO/VICODIN) 5-325 MG per tablet 1-2 tablet  1-2 tablet Oral Q6H PRN Awilda Bill, NP   1 tablet at 04/16/19 2250  . ipratropium-albuterol (DUONEB) 0.5-2.5 (3) MG/3ML nebulizer solution 3 mL  3 mL Nebulization Q6H Awilda Bill, NP   3 mL at 04/17/19 1335  . ipratropium-albuterol (DUONEB) 0.5-2.5 (3) MG/3ML nebulizer solution 3 mL  3 mL Nebulization Q6H PRN Awilda Bill, NP      . losartan (COZAAR) tablet 25 mg  25 mg Oral Daily Max Sane, MD   25 mg at 04/16/19 2251  . MEDLINE mouth rinse  15 mL Mouth Rinse BID Awilda Bill, NP   15 mL at 04/17/19 1026  . methylPREDNISolone sodium succinate (SOLU-MEDROL) 125 mg/2 mL injection 60 mg  60 mg Intravenous Q6H Manuella Ghazi, Vipul, MD   60 mg at 04/17/19 1025   Followed by  . [START ON 04/18/2019] predniSONE (DELTASONE) tablet 40 mg  40 mg Oral Q breakfast Max Sane, MD      . metoprolol succinate (TOPROL-XL) 24 hr tablet 25 mg  25 mg Oral Daily Manuella Ghazi, Vipul, MD   25 mg at 04/17/19 1025  . montelukast (SINGULAIR) tablet 10 mg  10 mg Oral QHS Max Sane, MD   10 mg at  04/16/19 2236  . morphine 2 MG/ML injection 1-2 mg  1-2 mg Intravenous Q4H PRN Awilda Bill, NP      . Derrill Memo ON 04/18/2019] multivitamin with minerals tablet 1 tablet  1 tablet Oral Daily  Manuella Ghazi, Vipul, MD      . ondansetron (ZOFRAN-ODT) disintegrating tablet 4 mg  4 mg Oral Q8H PRN Max Sane, MD   4 mg at 04/17/19 1025  . oxyCODONE (Oxy IR/ROXICODONE) immediate release tablet 10 mg  10 mg Oral QID PRN Max Sane, MD      . pantoprazole (PROTONIX) EC tablet 40 mg  40 mg Oral Daily Max Sane, MD   40 mg at 04/17/19 1025  . roflumilast (DALIRESP) tablet 250 mcg  250 mcg Oral Daily Max Sane, MD   250 mcg at 04/17/19 1025  . sodium chloride 0.9 % bolus 1,000 mL  1,000 mL Intravenous Once Duffy Bruce, MD   Stopped at 04/16/19 1450  . tamsulosin (FLOMAX) capsule 0.4 mg  0.4 mg Oral QPC supper Max Sane, MD      . traZODone (DESYREL) tablet 50 mg  50 mg Oral QHS PRN Awilda Bill, NP   50 mg at 04/17/19 0120     Abtx:  Anti-infectives (From admission, onward)   Start     Dose/Rate Route Frequency Ordered Stop   04/17/19 1000  azithromycin (ZITHROMAX) tablet 500 mg  Status:  Discontinued     500 mg Oral Daily 04/16/19 2155 04/16/19 2227   04/16/19 2200  rifampin (RIFADIN) capsule 600 mg  Status:  Discontinued     600 mg Oral Daily 04/16/19 2155 04/16/19 2156   04/16/19 2200  ethambutol (MYAMBUTOL) tablet 800 mg  Status:  Discontinued     800 mg Oral Daily 04/16/19 2155 04/16/19 2156   04/16/19 1530  vancomycin (VANCOREADY) IVPB 1250 mg/250 mL     1,250 mg 166.7 mL/hr over 90 Minutes Intravenous  Once 04/16/19 1524 04/16/19 1746   04/16/19 1530  ceFEPIme (MAXIPIME) 2 g in sodium chloride 0.9 % 100 mL IVPB  Status:  Discontinued     2 g 200 mL/hr over 30 Minutes Intravenous Every 12 hours 04/16/19 1524 04/16/19 2156      REVIEW OF SYSTEMS:  Const: negative fever, negative chills,  weight loss Eyes: negative diplopia or visual changes, negative eye pain ENT: negative coryza, negative sore throat Resp:  cough, , dyspnea Cards: negative for chest pain, palpitations, lower extremity edema GU: negative for frequency, dysuria and hematuria GI: Nausea, vomiting,  diarrhea Skin: negative for rash and pruritus Heme: negative for easy bruising and gum/nose bleeding MS: generalized weakness Neurolo:negative for headaches, has dizziness, falls memory problems  Psych:  Anxiety Endocrine: negative for thyroid, diabetes Allergy/Immunology- as noted above Objective:  VITALS:  BP (!) 84/57   Pulse 71   Temp 97.9 F (36.6 C) (Axillary)   Resp 18   Ht _0  (1.651 m)   Wt 53.3 kg   SpO2 95%   BMI 19.55 kg/m  PHYSICAL EXAM:  General: Alert, cooperative, no resp distress, appears stated age. emaciated Head: Normocephalic, without obvious abnormality, atraumatic. Eyes: Conjunctivae clear, anicteric sclerae. Pupils are equal ENT Nares normal. No drainage or sinus tenderness. Lips, mucosa, and tongue normal. No Thrush Neck: Supple, symmetrical, no adenopathy, thyroid: non tender no carotid bruit and no JVD. Back: No CVA tenderness. Lungs: b/l rhonchi Heart: Regular rate and rhythm, no murmur, rub or gallop. Abdomen: Soft, non-tender,not distended. Bowel sounds normal. No masses Extremities:  atraumatic, no cyanosis. No edema. No clubbing Skin: No rashes or lesions. Or bruising Lymph: Cervical, supraclavicular normal. Neurologic: Grossly non-focal Pertinent Labs Lab Results CBC    Component Value Date/Time   WBC 11.7 (H) 04/16/2019 1418   RBC 4.95 04/16/2019 1418   HGB 14.6 04/16/2019 1418   HGB 15.6 06/23/2012 1028   HCT 44.2 04/16/2019 1418   HCT 44.3 06/23/2012 1028   PLT 230 04/16/2019 1418   PLT 174 06/23/2012 1028   MCV 89.3 04/16/2019 1418   MCV 93 06/23/2012 1028   MCH 29.5 04/16/2019 1418   MCHC 33.0 04/16/2019 1418   RDW 14.6 04/16/2019 1418   RDW 13.1 06/23/2012 1028   LYMPHSABS 1.1 04/16/2019 1418   MONOABS 1.0 04/16/2019 1418   EOSABS 0.1 04/16/2019 1418   BASOSABS 0.0 04/16/2019 1418    CMP Latest Ref Rng & Units 04/17/2019 04/16/2019 04/11/2019  Glucose 70 - 99 mg/dL 138(H) 152(H) 117(H)  BUN 8 - 23 mg/dL 16 13 24(H)    Creatinine 0.61 - 1.24 mg/dL 1.04 1.20 1.25(H)  Sodium 135 - 145 mmol/L 133(L) 133(L) 135  Potassium 3.5 - 5.1 mmol/L 4.8 5.1 4.7  Chloride 98 - 111 mmol/L 103 99 104  CO2 22 - 32 mmol/L 20(L) 25 22  Calcium 8.9 - 10.3 mg/dL 8.2(L) 8.8(L) 8.2(L)  Total Protein 6.5 - 8.1 g/dL 5.9(L) 7.4 5.9(L)  Total Bilirubin 0.3 - 1.2 mg/dL 0.7 0.5 0.4  Alkaline Phos 38 - 126 U/L 75 102 99  AST 15 - 41 U/L 14(L) 21 26  ALT 0 - 44 U/L 26 35 28      Microbiology: Recent Results (from the past 240 hour(s))  Body fluid culture     Status: None   Collection Time: 04/08/19  4:58 PM   Specimen: PATH Cytology Pleural fluid  Result Value Ref Range Status   Specimen Description   Final    PLEURAL Performed at [facility], [address], [place], [place] [address]    Special Requests   Final    CYTO PLEU Performed at [facility], [address] [place], [place] [address]    Gram Stain NO WBC SEEN NO ORGANISMS SEEN   Final   Culture   Final    NO GROWTH 3 DAYS Performed at [facility], [place] [address], [place], [place] [address]    Report Status 04/12/2019 FINAL  Final  Fungus Culture With Stain     Status: None (Preliminary result)   Collection Time: 04/08/19  4:58 PM   Specimen: PATH Cytology Pleural fluid  Result Value Ref Range Status   Fungus Stain Final report  Final    Comment: (NOTE) Performed At: [facility] [address] [place], [place] [id number] Rush Farmer MD [id number]    Fungus (Mycology) Culture PENDING  Incomplete   Fungal Source PLEURAL  Final    Comment: Performed at [facility], [address] [place], [place] [address]  Acid Fast Smear (AFB)     Status: None   Collection Time: 04/08/19  4:58 PM   Specimen: PATH Cytology Pleural fluid  Result Value Ref Range Status   AFB Specimen Processing Concentration  Final   Acid Fast Smear Negative  Final    Comment: (NOTE) Performed At: [facility] [place], [place] [id number] Rush Farmer MD OJ:[id number]    Source (AFB) PLEURAL  Final    Comment: Performed at [facility], [place],  [place], [place] [address]  Fungus Culture Result     Status: None   Collection Time: 04/08/19  4:58 PM  Result Value Ref Range Status   Result 1 Comment  Final    Comment: (NOTE) KOH/Calcofluor preparation:  no fungus observed. Performed At: [facility] [place], [place] [id number] Rush Farmer MD [id number]   Blood culture (routine x 2)     Status: None (Preliminary result)   Collection Time: 04/16/19  2:18 PM   Specimen: BLOOD  Result Value Ref Range Status   Specimen Description BLOOD RIGHT ANTECUBITAL  Final   Special Requests   Final    BOTTLES DRAWN AEROBIC AND ANAEROBIC Blood Culture adequate volume   Culture   Final    NO GROWTH < 24 HOURS Performed at [facility], [address] [place], [place] [address]    Report Status PENDING  Incomplete  Respiratory Panel by RT PCR (Flu A&B, Covid) - Nasopharyngeal Swab     Status: None   Collection Time: 04/16/19  2:19 PM   Specimen: Nasopharyngeal Swab  Result Value Ref Range Status   SARS Coronavirus 2 by RT PCR NEGATIVE NEGATIVE Final    Comment: (NOTE) SARS-CoV-2 target nucleic acids are NOT DETECTED. The SARS-CoV-2 RNA is generally detectable in upper respiratoy specimens during the acute phase of infection. The lowest concentration of SARS-CoV-2 viral copies this assay can detect is 131 copies/mL. A negative result does not preclude SARS-Cov-2 infection and should not be used as the sole basis for treatment or other patient management decisions. A negative result may occur with  improper specimen collection/handling, submission of specimen other than nasopharyngeal swab, presence of viral mutation(s) within the areas targeted by this assay, and inadequate number of viral copies (<131 copies/mL). A negative result must  be combined with clinical observations, patient history, and epidemiological information. The expected result is Negative. Fact Sheet for Patients:  PinkCheek.be Fact Sheet for Healthcare Providers:  GravelBags.it This test is not yet ap proved or cleared by the [place] FDA and  has been authorized for detection and/or diagnosis of SARS-CoV-2 by FDA under an Emergency Use Authorization (EUA). This EUA will remain  in effect (meaning this test can be used) for the duration of the COVID-19 declaration under Section 564(b)(1) of the Act, 21 U.S.C. section 360bbb-3(b)(1), unless the authorization is terminated or revoked sooner.    Influenza A by PCR NEGATIVE NEGATIVE Final   Influenza B by PCR NEGATIVE NEGATIVE Final    Comment: (NOTE) The Xpert Xpress SARS-CoV-2/FLU/RSV assay is intended as an aid in  the diagnosis of influenza from Nasopharyngeal swab specimens and  should not be used as a sole basis for treatment. Nasal washings and  aspirates are unacceptable for Xpert Xpress SARS-CoV-2/FLU/RSV  testing. Fact Sheet for Patients: PinkCheek.be Fact Sheet for Healthcare Providers: GravelBags.it This test is not yet approved or cleared by the [place] FDA and  has been authorized for detection and/or diagnosis of SARS-CoV-2 by  FDA under an Emergency Use Authorization (EUA). This EUA will remain  in effect (meaning this test can be used) for the duration of the  Covid-19 declaration under Section 564(b)(1) of the Act, 21  U.S.C. section 360bbb-3(b)(1), unless the authorization is  terminated or revoked. Performed at [facility], [address] [place], [place] [address]   Blood culture (routine x 2)     Status: None (Preliminary result)   Collection Time: 04/16/19  2:28 PM   Specimen:  BLOOD  Result Value Ref Range Status   Specimen Description  BLOOD BLOOD RIGHT WRIST  Final   Special Requests   Final    BOTTLES DRAWN AEROBIC AND ANAEROBIC Blood Culture adequate volume   Culture   Final    NO GROWTH < 24 HOURS Performed at [facility], [address], [place], [place] [address]    Report Status PENDING  Incomplete  MRSA PCR Screening     Status: None   Collection Time: 04/16/19 10:12 PM   Specimen: Nasopharyngeal  Result Value Ref Range Status   MRSA by PCR NEGATIVE NEGATIVE Final    Comment:        The GeneXpert MRSA Assay (FDA approved for NASAL specimens only), is one component of a comprehensive MRSA colonization surveillance program. It is not intended to diagnose MRSA infection nor to guide or monitor treatment for MRSA infections. Performed at [facility], [address] [place],  [address]     IMAGING RESULTS:    Stable marked bilateral centrilobular bullous changes. Previously demonstrated right upper lobe consolidation with air bronchograms and focal distal bronchial dilatation without significant change. Small amount of consolidation in the posterior left lower lobe with little change. Moderate-sized right pleural effusion and small to moderate-sized left pleural effusion, both increased.  ? Impression/Recommendation ? 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 ___________________________________________________ Discussed with patient, requesting provider ID will follow him peripherally this weekend

## 2019-04-17 NOTE — Evaluation (Signed)
Physical Therapy Evaluation Patient Details Name: Joseph Peters MRN: [id number] DOB: [birth year] Today's Date: 04/17/2019   History of Present Illness  Joseph Peters is a 39yoM who comes to [facility] on 04/16/19. 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 PMH: CAD, COPD, hypertension, AAA, pulmonary fibrosis. Up to 19f AMB c RW, desaturation last admission. Chest  CT negative for PE.  Clinical Impression  Pt admitted with above diagnosis. Pt currently with functional limitations due to the deficits listed below (see "PT Problem List"). Upon entry, pt in bed, awake and agreeable to participate. Pt familiar to our services from prior admission past 12 months. The pt is alert and oriented x4, pleasant, conversational, and generally a good historian, reports no home setup updates since prior admission, save for O2 at home now. Pt received on 4L, 91%, moved to room air dropping to 85% with bed mobility to EOB, 4L donned again. Pt AMB on room air, drops to 84%, recovery on 4L over 60sec is poor, no clear improvement. Pt able to AMB up to 8105fon 8L before drop to 85%. Pt left on 7L at exit, as 6L flow rate was only getting pt back to 87%. Pt has good correlation of DOE c hypoxia. Mild instability on feet, should still use assistive device at home for safety. Functional mobility assessment demonstrates increased effort/time requirements, poor tolerance, and need for physical assistance, whereas the patient performed these at a higher level of independence PTA. Pt will benefit from skilled PT intervention to increase independence and safety with basic mobility in preparation for discharge to the venue listed below.       Follow Up Recommendations Home health PT    Equipment Recommendations  None recommended by PT    Recommendations for Other Services       Precautions / Restrictions Precautions Precautions: Fall Restrictions Weight Bearing Restrictions: No      Mobility  Bed  Mobility Overal bed mobility: Independent                Transfers Overall transfer level: Modified independent Equipment used: None Transfers: Sit to/from Stand Sit to Stand: Supervision         General transfer comment: minimal instability, good awareness, intermittent hand assist as needed  Ambulation/Gait Ambulation/Gait assistance: Supervision Gait Distance (Feet): 100 Feet Assistive device: None Gait Pattern/deviations: Step-to pattern     General Gait Details: 5-20103faps in room on 8LNC, drops to 85% at 76f57fslow recovery while seated (>2 minutes); instability, LOB with turns, but can self correct without hands.  Stairs            Wheelchair Mobility    Modified Rankin (Stroke Patients Only)       Balance Overall balance assessment: Mild deficits observed, not formally tested;Modified Independent                                           Pertinent Vitals/Pain Pain Assessment: No/denies pain    Home Living Family/patient expects to be discharged to:: Private residence Living Arrangements: Spouse/significant other Available Help at Discharge: Family;Friend(s);Available 24 hours/day Type of Home: Mobile home Home Access: Stairs to enter Entrance Stairs-Rails: Can reach both;Left;Right Entrance Stairs-Number of Steps: 1-2 depending on entrance with bilateral hand rails Home Layout: One level Home Equipment: Grab bars - toilet;Walker - 2 wheels;Cane - single point;Shower seat -  built in;Grab bars - tub/shower;Walker - 4 wheels Additional Comments: O2 at home since DC last week    Prior Function Level of Independence: Needs assistance   Gait / Transfers Assistance Needed: Community amb with no AD. Pt reports 1 fall in the past six months secondary to becoming SOB and "passing out"  ADL's / Homemaking Assistance Needed: Pt stated that he is ind with bathing but stated that a friend that lives with him will help with  IADLs  Comments: Per chart pt has had multiple falls in past six months including one immediately prior to his prior R THA in May. On this date he states he has had no falls in past 6 months.     Hand Dominance   Dominant Hand: Right    Extremity/Trunk Assessment             Cervical / Trunk Assessment Cervical / Trunk Assessment: Normal  Communication   Communication: No difficulties  Cognition Arousal/Alertness: Awake/alert Behavior During Therapy: WFL for tasks assessed/performed Overall Cognitive Status: Within Functional Limits for tasks assessed                                        General Comments      Exercises     Assessment/Plan    PT Assessment Patient needs continued PT services  PT Problem List Decreased strength;Decreased mobility;Decreased activity tolerance;Cardiopulmonary status limiting activity;Decreased balance;Decreased knowledge of use of DME       PT Treatment Interventions DME instruction;Therapeutic exercise;Gait training;Balance training;Stair training;Functional mobility training;Therapeutic activities    PT Goals (Current goals can be found in the Care Plan section)  Acute Rehab PT Goals Patient Stated Goal: get strength back PT Goal Formulation: With patient Time For Goal Achievement: 05/01/19 Potential to Achieve Goals: Good    Frequency Min 2X/week   Barriers to discharge        Co-evaluation               AM-PAC PT "6 Clicks" Mobility  Outcome Measure Help needed turning from your back to your side while in a flat bed without using bedrails?: A Little Help needed moving from lying on your back to sitting on the side of a flat bed without using bedrails?: A Little Help needed moving to and from a bed to a chair (including a wheelchair)?: A Little Help needed standing up from a chair using your arms (e.g., wheelchair or bedside chair)?: A Little Help needed to walk in hospital room?: A Little Help  needed climbing 3-5 steps with a railing? : A Little 6 Click Score: 18    End of Session Equipment Utilized During Treatment: Gait belt;Oxygen Activity Tolerance: Patient tolerated treatment well Patient left: in chair;with call bell/phone within reach Nurse Communication: Mobility status PT Visit Diagnosis: Unsteadiness on feet (R26.81);Muscle weakness (generalized) (M62.81);History of falling (Z91.81)    Time: 0950-1010 PT Time Calculation (min) (ACUTE ONLY): 20 min   Charges:   PT Evaluation $PT Eval Moderate Complexity: 1 Mod PT Treatments $Therapeutic Exercise: 8-22 mins        11:53 AM, 04/17/19 Etta Grandchild, PT, DPT Physical Therapist - [facility]  [phone] ([place])    Zyanna Leisinger C 04/17/2019, 11:48 AM

## 2019-04-17 NOTE — Clinical Social Work Note (Signed)
CSW acknowledges consult for COPD. Afflovest delivered to room today. [employer] also following for potential trilogy if needed. Patient is active with [place] for nursing and PT. Will continue to follow.  Dayton Scrape, [place]

## 2019-04-17 NOTE — Procedures (Signed)
PROCEDURE SUMMARY:  Patient brought to radiology for thoracentesis. He does have a lidocaine allergy.  Nesacaine 2% 10 mL used for procedure today without issue.  Successful US guided right diagnostic and therapeutic thoracentesis. Yielded 700 mL of yellow fluid. Pt tolerated procedure well. No immediate complications.  Specimen was sent for labs. CXR ordered.  EBL < 5 mL  Docia Barrier PA-C 04/17/2019 11:20 AM

## 2019-04-17 NOTE — Progress Notes (Signed)
[place] [facility] [place] NAME: Joseph Peters    MR#:  [id number]  DATE OF BIRTH:  1955-06-29  SUBJECTIVE:  CHIEF COMPLAINT:   Chief Complaint  Patient presents with  . Shortness of Breath  feels better, still SOB though, off BiPAP REVIEW OF SYSTEMS:  Review of Systems  Constitutional: Negative for diaphoresis, fever, malaise/fatigue and weight loss.  HENT: Negative for ear discharge, ear pain, hearing loss, nosebleeds, sore throat and tinnitus.   Eyes: Negative for blurred vision and pain.  Respiratory: Positive for shortness of breath. Negative for cough, hemoptysis and wheezing.   Cardiovascular: Negative for chest pain, palpitations, orthopnea and leg swelling.  Gastrointestinal: Negative for abdominal pain, blood in stool, constipation, diarrhea, heartburn, nausea and vomiting.  Genitourinary: Negative for dysuria, frequency and urgency.  Musculoskeletal: Negative for back pain and myalgias.  Skin: Negative for itching and rash.  Neurological: Negative for dizziness, tingling, tremors, focal weakness, seizures, weakness and headaches.  Psychiatric/Behavioral: Negative for depression. The patient is not nervous/anxious.     DRUG ALLERGIES:   Allergies  Allergen Reactions  . Penicillins Swelling, Rash and Other (See Comments)    Swelling in throat  Did it involve swelling of the face/tongue/throat, SOB, or low BP? yes Did it involve sudden or severe rash/hives, skin peeling, or any reaction on the inside of your mouth or nose? no Did you need to seek medical attention at a hospital or doctor's office? Yes When did it last happen?years  If all above answers are "NO", may proceed with cephalosporin use.    . Lidocaine Other (See Comments)    Unsure  At dentist's office   VITALS:  Blood pressure 94/76, pulse 68, temperature 97.9 F (36.6 C), temperature source Axillary, resp. rate 18, height _0  (1.651 m), weight 53.3 kg, SpO2 93  %. PHYSICAL EXAMINATION:  Physical Exam Constitutional:      Appearance: He is underweight.  HENT:     Head: Normocephalic and atraumatic.  Eyes:     Conjunctiva/sclera: Conjunctivae normal.     Pupils: Pupils are equal, round, and reactive to light.  Neck:     Thyroid: No thyromegaly.     Trachea: No tracheal deviation.  Cardiovascular:     Rate and Rhythm: Normal rate and regular rhythm.     Heart sounds: Normal heart sounds.  Pulmonary:     Effort: Pulmonary effort is normal. No respiratory distress.     Breath sounds: Normal breath sounds. No wheezing.  Chest:     Chest wall: No tenderness.  Abdominal:     General: Bowel sounds are normal. There is no distension.     Palpations: Abdomen is soft.     Tenderness: There is no abdominal tenderness.  Musculoskeletal:        General: Normal range of motion.     Cervical back: Normal range of motion and neck supple.  Skin:    General: Skin is warm and dry.     Findings: No rash.  Neurological:     Mental Status: He is alert and oriented to person, place, and time.     Cranial Nerves: No cranial nerve deficit.    LABORATORY PANEL:  Male CBC Recent Labs  Lab 04/16/19 1418  WBC 11.7*  HGB 14.6  HCT 44.2  PLT 230   ------------------------------------------------------------------------------------------------------------------ Chemistries  Recent Labs  Lab 04/17/19 0519  NA 133*  K 4.8  CL 103  CO2 20*  GLUCOSE 138*  BUN 16  CREATININE 1.04  CALCIUM 8.2*  MG 2.4  AST 14*  ALT 26  ALKPHOS 75  BILITOT 0.7   RADIOLOGY:  DG Chest Port 1 View  Result Date: 04/17/2019 CLINICAL DATA:  Right-sided thoracentesis EXAM: PORTABLE CHEST 1 VIEW COMPARISON:  Yesterday FINDINGS: Interstitial and airspace opacity in the right upper lobe with volume loss. Normal heart size and stable mediastinal contours. No visible pneumothorax. Trace left pleural effusion. IMPRESSION: 1. No complicating features after thoracentesis. 2.  Emphysema and stable right upper lobe opacity with volume loss. Electronically Signed   By: Monte Fantasia M.D.   On: 04/17/2019 11:17   US THORACENTESIS ASP PLEURAL SPACE W/IMG GUIDE  Result Date: 04/17/2019 INDICATION: Patient with history of COPD, shortness of breath, right-sided pleural effusion. Request is made for diagnostic and therapeutic right thoracentesis. EXAM: ULTRASOUND GUIDED RIGHT THORACENTESIS MEDICATIONS: 10 mL 2% nesacaine Patient with documented allergy to lidocaine, recent tolerance of nesacaine injection 5/46/27 COMPLICATIONS: None immediate. PROCEDURE: An ultrasound guided thoracentesis was thoroughly discussed with the patient and questions answered. The benefits, risks, alternatives and complications were also discussed. The patient understands and wishes to proceed with the procedure. Written consent was obtained. Ultrasound was performed to localize and mark an adequate pocket of fluid in the right chest. The area was then prepped and draped in the normal sterile fashion. 1% Lidocaine was used for local anesthesia. Under ultrasound guidance a 6 Fr Safe-T-Centesis catheter was introduced. Thoracentesis was performed. The catheter was removed and a dressing applied. FINDINGS: A total of approximately 700 mL of yellow, clear fluid was removed. Samples were sent to the laboratory as requested by the clinical team. IMPRESSION: Successful ultrasound guided right thoracentesis yielding 700 mL of pleural fluid. Read by: Brynda Greathouse PA-C Electronically Signed   By: Aletta Edouard M.D.   On: 04/17/2019 11:26   ASSESSMENT AND PLAN:  Anthony Tamburo Mosleyis a 64 y.o.malewith a history ofadvancedpulmonary fibrosis and centrilobular bullous emphysema with grade 2D COPDwith DLCO 45%, cavitary MAC infection rt lung is being admitted for acute on chronic hypoxic resp failure   * Acute on chronic hypoxic resp failure - requiring BiPAP - likely multifactorial, secondary to MAC &severe  COPD/Pulmo fibrosis - DuoNebs every 6 hours & continue home inhalers -continue Solu-Medrol IV - weaned off BiPAP - on 6 liters [place] O2  -Aggressive bronchopulmonary hygiene protocol -PCCM following  * Cavitary MAC Rt lung He was recently admitted to hospital with increasing SOB on 04/07/19 and was discharge don 04/11/19 with home oxygen - doubt his compliance to MAC regimen - azithromycin/rifampin/ethambutol) - had televisit with ID on 2/16 and was recommended to space out Abx and resume it. - pending ID c/s- Abx per them  * B/l pleural effusion: Rt > Lt  s/p right pleural thoracentesis on 04/08/19.  * HTN: continue losartan & metoprolol  * HLD: continue on statin  * Thoracic aortic aneurysm: CTA chest is neg for PE but shows and ascending thoracic aortic aneurysm 4.5 x 4.6 cmand also aneurysmal dilatation of descending thoracic aorta without evidence of dissection. Vascular surg previously knew about aneurysm unable to repair until MAC infection is completely resolved as a high mortality rate w/ infection of graft as per vascular surg   PT/OT eval   DVT prophylaxis: Lovenox Family Communication:  NO "discussed with patient" Disposition Plan:  Came from Home D/C back home Barriers to DC - await clinical improvement  All the records are reviewed and case discussed with Care Management/Social Worker. Management plans  discussed with the patient, nursing and they are in agreement.  CODE STATUS: Full Code  TOTAL TIME TAKING CARE OF THIS PATIENT: 35 minutes.   More than 50% of the time was spent in counseling/coordination of care: YES  POSSIBLE D/C IN 1-2 DAYS, DEPENDING ON CLINICAL CONDITION.   Max Sane M.D on 04/17/2019 at 7:01 PM  [facility]   CC: Primary care physician; Barbaraann Boys, MD  Note: This dictation was prepared with Dragon dictation along with smaller phrase technology. Any transcriptional errors that result from this process are  unintentional.

## 2019-04-17 NOTE — Progress Notes (Signed)
[place] visited pt. per OR for prayer; pt. in bed w/nasal canula; RN attending to pt. when [facility] arrived.  Pt. shared he came to [facility] last week w/difficulty breathing and was discharged "Sunday; readmitted today for further difficulty breathing.  Pt. lapsed into a coughing fit midway through visit and appeared to be in significant discomfort until RN administered meds.  Pt. 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 spent 15 yrs. in [employer] --> said he continues to think about 'things I had to do --had no choice' and about the poverty and deprivation he witnessed overseas while deployed.  Pt. lives w/ his wife and they have two adult children.  Pt.'s brother died of COVID two weeks ago, pt. shared.  Pt. grew tired after some conversation and CH ended visit in prayer for pt. and his RN.  Pt. aware of chaplains' availability if needed.       02" /18/21 2300  Clinical Encounter Type  Visited With Patient;Health care provider  Visit Type Initial;Spiritual support;Psychological support;Social support;Critical Care  Referral From Nurse  Consult/Referral To Chaplain  Spiritual Encounters  Spiritual Needs Prayer;Ritual  Stress Factors  Patient Stress Factors Health changes;Other (Comment) (Chest pain)

## 2019-04-17 NOTE — Progress Notes (Signed)
Initial Nutrition Assessment  DOCUMENTATION CODES:   Severe malnutrition in context of chronic illness  INTERVENTION:   Ensure Enlive po TID, each supplement provides 350 kcal and 20 grams of protein  Magic cup TID with meals, each supplement provides 290 kcal and 9 grams of protein  MVI daily   Liberalize diet   NUTRITION DIAGNOSIS:   Severe Malnutrition related to chronic illness(COPD) as evidenced by severe fat depletion, severe muscle depletion.  GOAL:   Patient will meet greater than or equal to 90% of their needs  MONITOR:   PO intake, Supplement acceptance, Labs, Weight trends, Skin, I & O's  REASON FOR ASSESSMENT:   Consult Assessment of nutrition requirement/status  ASSESSMENT:   64 yo male with h/o COPD, etoh abuse, AAA, IBS, stroke and recently discharged 02/13 following treatment acute on chronic hypoxic respiratory failure of severe COPD and cavitary MAC rt lung who is not admitted wtih SOB, chest pain and nausea   Pt s/p paracentesis today with 717m output  Pt is known to this RD from previous admits. Pt with poor appetite and oral intake at baseline. Pt is compliant with supplements in hospital but does not drink them at home. Pt eating anywhere from 20-100% of meals during his last admit. Per chart, pt lost 17lbs(13%) from March-November of 2020 but appears to be weight stable over the past several months. RD will add supplements and MVI to help pt meet his estimated needs. RD will also liberalize pt's diet as pt is not likely eating enough to exceed nutrient limits.    Medications reviewed and include: lovenox, solu.medrol, protonix  Labs reviewed: Na 133(L), P 4.3 wnl, Mg 2.4 wnl BNP >4500(H)- 2/18 Wbc- 11.7(H)  NUTRITION - FOCUSED PHYSICAL EXAM:    Most Recent Value  Orbital Region  Mild depletion  Upper Arm Region  Severe depletion  Thoracic and Lumbar Region  Severe depletion  Buccal Region  Moderate depletion  Temple Region  Moderate  depletion  Clavicle Bone Region  Moderate depletion  Clavicle and Acromion Bone Region  Moderate depletion  Scapular Bone Region  Moderate depletion  Dorsal Hand  Moderate depletion  Patellar Region  Severe depletion  Anterior Thigh Region  Severe depletion  Posterior Calf Region  Severe depletion  Edema (RD Assessment)  None  Hair  Reviewed  Eyes  Reviewed  Mouth  Reviewed  Skin  Reviewed  Nails  Reviewed     Diet Order:   Diet Order            Diet Heart Room service appropriate? Yes; Fluid consistency: Thin  Diet effective now             EDUCATION NEEDS:   Education needs have been addressed  Skin:  Skin Assessment: Reviewed RN Assessment(ecchymosis)  Last BM:  2/18  Height:   Ht Readings from Last 1 Encounters:  04/16/19 _0  (1.651 m)    Weight:   Wt Readings from Last 1 Encounters:  04/16/19 53.3 kg    Ideal Body Weight:  61.8 kg  BMI:  Body mass index is 19.55 kg/m.  Estimated Nutritional Needs:   Kcal:  1700-1900kcal/day  Protein:  85-95g/day  Fluid:  >1.3L/day  CKoleen DistanceMS, RD, LDN Contact information available in Amion

## 2019-04-17 NOTE — Evaluation (Signed)
Occupational Therapy Evaluation Patient Details Name: Joseph Peters MRN: [id number] DOB: Jun 23, 1955 Today's Date: 04/17/2019    History of Present Illness Joseph Peters is a 74yoM who comes to [facility] on 04/16/19. 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 PMH: CAD, COPD, hypertension, AAA, pulmonary fibrosis. Chest  CT negative for PE.   Clinical Impression   Pt was seen for OT evaluation this date. Prior to hospital admission, pt was Indep with BADLs and requiring help from live in aide/friend for IADLs including cleaning and med mgt for pt and his spouse. Pt lives in [place] mobile home with 1-2 STE. Currently pt demonstrates impairments as described below (See OT problem list) which functionally limit his ability to perform ADL/self-care tasks. Pt currently requires SBA and consistent O2 monitoring with cues for PLB to complete any standing activity, fxl mobility or ADL transfer d/t decreased fxl activity tolerance.  Pt would benefit from skilled OT to address noted impairments and functional limitations (see below for any additional details) in order to maximize safety and independence while minimizing falls risk and caregiver burden. Upon hospital discharge, recommend HHOT to maximize pt safety and return to functional independence during meaningful occupations of daily life.    Follow Up Recommendations  Home health OT;Supervision - Intermittent    Equipment Recommendations  None recommended by OT    Recommendations for Other Services       Precautions / Restrictions Precautions Precautions: Fall Restrictions Weight Bearing Restrictions: No      Mobility Bed Mobility Overal bed mobility: Independent                Transfers Overall transfer level: Modified independent Equipment used: None Transfers: Sit to/from Stand Sit to Stand: Supervision         General transfer comment: extended time and de-sats with sit<>stand on 4Lnc from 94% to 90%, on RA,  desats to 84% with activity (88-89% on RA at rest), requires 1-2 minute seated rest break, initially OT sets on 3L and pt recovers to 88-89%, requires 4Lnc replaced to recover to >90%    Balance Overall balance assessment: Mild deficits observed, not formally tested;Modified Independent                                         ADL either performed or assessed with clinical judgement   ADL Overall ADL's : At baseline                                       General ADL Comments: pt required extended time and setup d/t low fxl activity tolerance with seated and standing UB/LB ADLs.     Vision Baseline Vision/History: Wears glasses Wears Glasses: Reading only Additional Comments: tracks appropriately, overall [facility]     Perception     Praxis      Pertinent Vitals/Pain Pain Assessment: No/denies pain     Hand Dominance Right   Extremity/Trunk Assessment Upper Extremity Assessment Upper Extremity Assessment: Overall WFL for tasks assessed   Lower Extremity Assessment Lower Extremity Assessment: Generalized weakness   Cervical / Trunk Assessment Cervical / Trunk Assessment: Normal   Communication Communication Communication: No difficulties   Cognition Arousal/Alertness: Awake/alert Behavior During Therapy: WFL for tasks assessed/performed Overall Cognitive Status: Within Functional Limits for tasks assessed  General Comments       Exercises Other Exercises Other Exercises: OT facilitaes education re: PLB technique and rationale. Pt familiar with technique, but not rationale. Other Exercises: OT facilitates education on POC to gradually increase fxl activity tolerance while integrating EC techniques. Pt verbalized understanding.   Shoulder Instructions      Home Living Family/patient expects to be discharged to:: Private residence Living Arrangements: Spouse/significant other Available  Help at Discharge: Family;Friend(s);Available 24 hours/day Type of Home: Mobile home Home Access: Stairs to enter Entrance Stairs-Number of Steps: 1-2 depending on entrance with bilateral hand rails Entrance Stairs-Rails: Can reach both;Left;Right Home Layout: One level     Bathroom Shower/Tub: Teacher, early years/pre: Standard Bathroom Accessibility: Yes   Home Equipment: Grab bars - toilet;Walker - 2 wheels;Cane - single point;Shower seat - built in;Grab bars - tub/shower;Walker - 4 wheels   Additional Comments: O2 at home since DC last week      Prior Functioning/Environment Level of Independence: Needs assistance  Gait / Transfers Assistance Needed: Community amb with no AD. Pt reports 1 fall in the past six months secondary to becoming SOB and "passing out" ADL's / Homemaking Assistance Needed: Pt stated that he is ind with bathing but stated that a friend/aide lives with him will help with IADLs including cooking, cleaning, med mgt and taking pt and his spouse's vital signs.   Comments: Per chart pt has had multiple falls in past six months including one immediately prior to his prior R THA in May. On this date he states he has had no falls in past 6 months.        OT Problem List: Decreased activity tolerance;Decreased strength      OT Treatment/Interventions:      OT Goals(Current goals can be found in the care plan section) Acute Rehab OT Goals Patient Stated Goal: to get better, go home and not come back to hospital (at least for a long time) OT Goal Formulation: With patient Time For Goal Achievement: 05/01/19 Potential to Achieve Goals: Good  OT Frequency: Min 2X/week   Barriers to D/C:            Co-evaluation              AM-PAC OT "6 Clicks" Daily Activity     Outcome Measure Help from another person eating meals?: None Help from another person taking care of personal grooming?: A Little Help from another person toileting, which  includes using toliet, bedpan, or urinal?: A Little Help from another person bathing (including washing, rinsing, drying)?: A Little Help from another person to put on and taking off regular upper body clothing?: A Little Help from another person to put on and taking off regular lower body clothing?: A Little 6 Click Score: 19   End of Session Nurse Communication: Mobility status  Activity Tolerance: Patient tolerated treatment well Patient left: in bed;with call bell/phone within reach  OT Visit Diagnosis: History of falling (Z91.81)                Time: 7672-0947 OT Time Calculation (min): 38 min Charges:  OT General Charges $OT Visit: 1 Visit OT Evaluation $OT Eval Low Complexity: 1 Low OT Treatments $Self Care/Home Management : 8-22 mins $Therapeutic Activity: 8-22 mins  Gerrianne Scale, MS, OTR/L ascom [phone] 04/17/19, 3:52 PM

## 2019-04-18 DIAGNOSIS — I10 Essential (primary) hypertension: Secondary | ICD-10-CM

## 2019-04-18 LAB — BASIC METABOLIC PANEL
Anion gap: 9 (ref 5–15)
BUN: 29 mg/dL — ABNORMAL HIGH (ref 8–23)
CO2: 23 mmol/L (ref 22–32)
Calcium: 8.3 mg/dL — ABNORMAL LOW (ref 8.9–10.3)
Chloride: 102 mmol/L (ref 98–111)
Creatinine, Ser: 1.21 mg/dL (ref 0.61–1.24)
GFR calc Af Amer: >60 mL/min (ref >60)
GFR calc non Af Amer: >60 mL/min (ref >60)
Glucose, Bld: 115 mg/dL — ABNORMAL HIGH (ref 70–99)
Potassium: 4.9 mmol/L (ref 3.5–5.1)
Sodium: 134 mmol/L — ABNORMAL LOW (ref 135–145)

## 2019-04-18 LAB — CBC WITH DIFFERENTIAL/PLATELET
Abs Immature Granulocytes: 0.07 10*3/uL (ref 0.00–0.07)
Basophils Absolute: 0 10*3/uL (ref 0.0–0.1)
Basophils Relative: 0 %
Eosinophils Absolute: 0 10*3/uL (ref 0.0–0.5)
Eosinophils Relative: 0 %
HCT: 34.2 % — ABNORMAL LOW (ref 39.0–52.0)
Hemoglobin: 11.3 g/dL — ABNORMAL LOW (ref 13.0–17.0)
Immature Granulocytes: 1 %
Lymphocytes Relative: 6 %
Lymphs Abs: 0.7 10*3/uL (ref 0.7–4.0)
MCH: 29.6 pg (ref 26.0–34.0)
MCHC: 33 g/dL (ref 30.0–36.0)
MCV: 89.5 fL (ref 80.0–100.0)
Monocytes Absolute: 0.7 10*3/uL (ref 0.1–1.0)
Monocytes Relative: 6 %
Neutro Abs: 10.8 10*3/uL — ABNORMAL HIGH (ref 1.7–7.7)
Neutrophils Relative %: 87 %
Platelets: 190 10*3/uL (ref 150–400)
RBC: 3.82 MIL/uL — ABNORMAL LOW (ref 4.22–5.81)
RDW: 14.6 % (ref 11.5–15.5)
WBC: 12.3 10*3/uL — ABNORMAL HIGH (ref 4.0–10.5)
nRBC: 0 % (ref 0.0–0.2)

## 2019-04-18 NOTE — Progress Notes (Signed)
Occupational Therapy Treatment Patient Details Name: Joseph Peters MRN: [id number] DOB: 19-Sep-1955 Today's Date: 04/18/2019    History of present illness Joseph Peters is a 59yoM who comes to [facility] on 04/16/19. 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 PMH: CAD, COPD, hypertension, AAA, pulmonary fibrosis. Chest  CT negative for PE.   OT comments  Pt seen to review energy conservation handout and proper breathing tech for ADLs as well as safe use of O2 at home. He is now on 3L of O2 and continues to have fluctuations in O2 sats in the 80s to 90s while sitting up in bed and at EOB.  Reviewed rec for adaptations for self care skills and to do as much as possible for himself and not rely on help at home to do things for him.  He was able to demonstrated pursed lip breathing well but continues to use accessory muscles in upper chest vs lower abdomen.  He uses 3L of O2 at home.   Follow Up Recommendations  Home health OT;Supervision - Intermittent    Equipment Recommendations  Other (comment)(elastic shoe laces, reacher)    Recommendations for Other Services      Precautions / Restrictions Precautions Precautions: Fall Restrictions Weight Bearing Restrictions: No       Mobility Bed Mobility                  Transfers                      Balance                                           ADL either performed or assessed with clinical judgement   ADL Overall ADL's : Needs assistance/impaired                                       General ADL Comments: Pt seen to review energy conservation handout and proper breathing tech for ADLs as well as safe use of O2 at home. He is now on 3L of O2 and continues to have fluctuations in O2 sats in the 80s to 90s while sitting up in bed and at EOB.  Reviewed rec for adaptations for self care skills and to do as much as possible for himself and not rely on help at home to do  things for him.  He was able to demonstrated pursed lip breathing well but continues to use accessory muscles in upper chest vs lower abdomen.  He uses 3L of O2 at home.     Vision       Perception     Praxis      Cognition Arousal/Alertness: Awake/alert Behavior During Therapy: WFL for tasks assessed/performed;Anxious Overall Cognitive Status: Within Functional Limits for tasks assessed                                          Exercises     Shoulder Instructions       General Comments      Pertinent Vitals/ Pain       Pain Assessment: No/denies pain  Home Living  Prior Functioning/Environment              Frequency  Min 2X/week        Progress Toward Goals  OT Goals(current goals can now be found in the care plan section)  Progress towards OT goals: Progressing toward goals  Acute Rehab OT Goals Patient Stated Goal: to get better, go home and not come back to hospital (at least for a long time) OT Goal Formulation: With patient Time For Goal Achievement: 05/01/19 Potential to Achieve Goals: Good  Plan Discharge plan remains appropriate    Co-evaluation                 AM-PAC OT "6 Clicks" Daily Activity     Outcome Measure   Help from another person eating meals?: None Help from another person taking care of personal grooming?: A Little Help from another person toileting, which includes using toliet, bedpan, or urinal?: A Little Help from another person bathing (including washing, rinsing, drying)?: A Little Help from another person to put on and taking off regular upper body clothing?: A Little Help from another person to put on and taking off regular lower body clothing?: A Little 6 Click Score: 19    End of Session    OT Visit Diagnosis: History of falling (Z91.81)   Activity Tolerance Patient tolerated treatment well   Patient Left in bed;with call  bell/phone within reach;with nursing/sitter in room(NSG arrived to assess sats when off of O2 per MD orders)   Nurse Communication          Time: 1530-1550 OT Time Calculation (min): 20 min  Charges: OT General Charges $OT Visit: 1 Visit OT Treatments $Self Care/Home Management : 8-22 mins  Joseph Peters, OTR/L, [place] ascom [phone] 04/18/19, 4:06 PM

## 2019-04-18 NOTE — Progress Notes (Signed)
[place] [facility] [place] NAME: Joseph Peters    MR#:  [id number]  DATE OF BIRTH:  1956-01-20  SUBJECTIVE:  CHIEF COMPLAINT:   Chief Complaint  Patient presents with  . Shortness of Breath  feels better, still SOB though, on n.c. REVIEW OF SYSTEMS:  Review of Systems  Constitutional: Negative for diaphoresis, fever, malaise/fatigue and weight loss.  HENT: Negative for ear discharge, ear pain, hearing loss, nosebleeds, sore throat and tinnitus.   Eyes: Negative for blurred vision and pain.  Respiratory: Positive for shortness of breath. Negative for cough, hemoptysis and wheezing.   Cardiovascular: Negative for chest pain, palpitations, orthopnea and leg swelling.  Gastrointestinal: Negative for abdominal pain, blood in stool, constipation, diarrhea, heartburn, nausea and vomiting.  Genitourinary: Negative for dysuria, frequency and urgency.  Musculoskeletal: Negative for back pain and myalgias.  Skin: Negative for itching and rash.  Neurological: Negative for dizziness, tingling, tremors, focal weakness, seizures, weakness and headaches.  Psychiatric/Behavioral: Negative for depression. The patient is not nervous/anxious.    DRUG ALLERGIES:   Allergies  Allergen Reactions  . Penicillins Swelling, Rash and Other (See Comments)    Swelling in throat  Did it involve swelling of the face/tongue/throat, SOB, or low BP? yes Did it involve sudden or severe rash/hives, skin peeling, or any reaction on the inside of your mouth or nose? no Did you need to seek medical attention at a hospital or doctor's office? Yes When did it last happen?years  If all above answers are "NO", may proceed with cephalosporin use.    . Lidocaine Other (See Comments)    Unsure  At dentist's office   VITALS:  Blood pressure 114/82, pulse 76, temperature 98.1 F (36.7 C), temperature source Oral, resp. rate 18, height _0  (1.651 m), weight 53.3 kg, SpO2 97 %. PHYSICAL  EXAMINATION:  Physical Exam Constitutional:      Appearance: He is underweight.  HENT:     Head: Normocephalic and atraumatic.  Eyes:     Conjunctiva/sclera: Conjunctivae normal.     Pupils: Pupils are equal, round, and reactive to light.  Neck:     Thyroid: No thyromegaly.     Trachea: No tracheal deviation.  Cardiovascular:     Rate and Rhythm: Normal rate and regular rhythm.     Heart sounds: Normal heart sounds.  Pulmonary:     Effort: Pulmonary effort is normal. No respiratory distress.     Breath sounds: Normal breath sounds. No wheezing.  Chest:     Chest wall: No tenderness.  Abdominal:     General: Bowel sounds are normal. There is no distension.     Palpations: Abdomen is soft.     Tenderness: There is no abdominal tenderness.  Musculoskeletal:        General: Normal range of motion.     Cervical back: Normal range of motion and neck supple.  Skin:    General: Skin is warm and dry.     Findings: No rash.  Neurological:     Mental Status: He is alert and oriented to person, place, and time.     Cranial Nerves: No cranial nerve deficit.    LABORATORY PANEL:  Male CBC Recent Labs  Lab 04/18/19 0450  WBC 12.3*  HGB 11.3*  HCT 34.2*  PLT 190   ------------------------------------------------------------------------------------------------------------------ Chemistries  Recent Labs  Lab 04/17/19 0519 04/17/19 0519 04/18/19 0450  NA 133*   < > 134*  K 4.8   < >  4.9  CL 103   < > 102  CO2 20*   < > 23  GLUCOSE 138*   < > 115*  BUN 16   < > 29*  CREATININE 1.04   < > 1.21  CALCIUM 8.2*   < > 8.3*  MG 2.4  --   --   AST 14*  --   --   ALT 26  --   --   ALKPHOS 75  --   --   BILITOT 0.7  --   --    < > = values in this interval not displayed.   RADIOLOGY:  No results found. ASSESSMENT AND PLAN:  Joseph Peters a 64 y.o.malewith a history ofadvancedpulmonary fibrosis and centrilobular bullous emphysema with grade 2D COPDwith DLCO 45%,  cavitary MAC infection rt lung is being admitted for acute on chronic hypoxic resp failure   * Acute on chronic hypoxic resp failure - requiring BiPAP - likely multifactorial, secondary to MAC &severe COPD/Pulmo fibrosis - DuoNebs every 6 hours & continue home inhalers - continue Solu-Medrol IV - weaned off BiPAP - on 3 liters [place] O2  -Aggressive bronchopulmonary hygiene protocol -PCCM following  * Cavitary MAC Rt lung He was recently admitted to hospital with increasing SOB on 04/07/19 and was discharge don 04/11/19 with home oxygen - doubt his compliance to MAC regimen - azithromycin/rifampin/ethambutol) - had televisit with ID on 2/16 and was recommended to space out Abx and resume it. - pending ID c/s- Abx per them  * B/l pleural effusion: Rt > Lt  s/p right pleural thoracentesis on 04/08/19.  * HTN: continue losartan & metoprolol  * HLD: continue on statin  * Thoracic aortic aneurysm: CTA chest is neg for PE but shows and ascending thoracic aortic aneurysm 4.5 x 4.6 cmand also aneurysmal dilatation of descending thoracic aorta without evidence of dissection. Vascular surg previously knew about aneurysm unable to repair until MAC infection is completely resolved as a high mortality rate w/ infection of graft as per vascular surg   PT/OT eval   DVT prophylaxis: Lovenox Family Communication:  NO "discussed with patient" Disposition Plan:  Came from Home D/C back home Barriers to DC - await clinical improvement  All the records are reviewed and case discussed with Care Management/Social Worker. Management plans discussed with the patient, nursing and they are in agreement.  CODE STATUS: Full Code  TOTAL TIME TAKING CARE OF THIS PATIENT: 35 minutes.   More than 50% of the time was spent in counseling/coordination of care: YES  POSSIBLE D/C IN 1-2 DAYS, DEPENDING ON CLINICAL CONDITION.   Max Sane M.D on 04/18/2019 at 1:54 PM  [facility]   CC: Primary  care physician; Barbaraann Boys, MD  Note: This dictation was prepared with Dragon dictation along with smaller phrase technology. Any transcriptional errors that result from this process are unintentional.

## 2019-04-18 NOTE — Progress Notes (Signed)
Patient oxygen saturation on room air is 84% while ambulating.  Patient was placed on 2L and oxygen saturation was 94%. Patient left on 2L.

## 2019-04-19 LAB — CBC
HCT: 36.8 % — ABNORMAL LOW (ref 39.0–52.0)
Hemoglobin: 11.9 g/dL — ABNORMAL LOW (ref 13.0–17.0)
MCH: 29.5 pg (ref 26.0–34.0)
MCHC: 32.3 g/dL (ref 30.0–36.0)
MCV: 91.1 fL (ref 80.0–100.0)
Platelets: 199 10*3/uL (ref 150–400)
RBC: 4.04 MIL/uL — ABNORMAL LOW (ref 4.22–5.81)
RDW: 14.6 % (ref 11.5–15.5)
WBC: 10.5 10*3/uL (ref 4.0–10.5)
nRBC: 0 % (ref 0.0–0.2)

## 2019-04-19 LAB — BASIC METABOLIC PANEL
Anion gap: 10 (ref 5–15)
BUN: 25 mg/dL — ABNORMAL HIGH (ref 8–23)
CO2: 24 mmol/L (ref 22–32)
Calcium: 8.2 mg/dL — ABNORMAL LOW (ref 8.9–10.3)
Chloride: 101 mmol/L (ref 98–111)
Creatinine, Ser: 1 mg/dL (ref 0.61–1.24)
GFR calc Af Amer: >60 mL/min (ref >60)
GFR calc non Af Amer: >60 mL/min (ref >60)
Glucose, Bld: 93 mg/dL (ref 70–99)
Potassium: 4.6 mmol/L (ref 3.5–5.1)
Sodium: 135 mmol/L (ref 135–145)

## 2019-04-19 MED ORDER — BENZONATATE 100 MG PO CAPS
200.0000 mg | ORAL_CAPSULE | Freq: Three times a day (TID) | ORAL | Status: DC
  Administered 2019-04-19 (×2): 200 mg via ORAL
  Filled 2019-04-19 (×2): qty 2

## 2019-04-19 NOTE — Plan of Care (Signed)
  Problem: Education: Goal: Knowledge of General Education information will improve Description: Including pain rating scale, medication(s)/side effects and non-pharmacologic comfort measures Outcome: Progressing   Problem: Clinical Measurements: Goal: Ability to maintain clinical measurements within normal limits will improve Outcome: Progressing Goal: Will remain free from infection Outcome: Progressing Goal: Diagnostic test results will improve Outcome: Progressing   Problem: Activity: Goal: Risk for activity intolerance will decrease Outcome: Progressing   Problem: Coping: Goal: Level of anxiety will decrease Outcome: Progressing   Problem: Pain Managment: Goal: General experience of comfort will improve Outcome: Progressing   Problem: Safety: Goal: Ability to remain free from injury will improve Outcome: Progressing   

## 2019-04-19 NOTE — Progress Notes (Signed)
PT Cancellation Note  Patient Details Name: Joseph Peters MRN: [id number] DOB: 1955-05-16   Cancelled Treatment:    Reason Eval/Treat Not Completed: Fatigue/lethargy limiting ability to participate;Patient declined, no reason specified(Patient declined PT session today due to feeling fatigued and "not feeling right" reports he couldn't sleep or eat. Will attempt again at a later time/date.)  Janna Arch, PT, DPT   04/19/2019, 3:38 PM

## 2019-04-19 NOTE — Progress Notes (Signed)
[facility] at [facility] NAME: Joseph Peters    MR#:  [id number]  DATE OF BIRTH:  Jan 03, 1956  SUBJECTIVE:  CHIEF COMPLAINT:   Chief Complaint  Patient presents with  . Shortness of Breath  Could not sleep well due to constant coughing all night, feels short of breath REVIEW OF SYSTEMS:  Review of Systems  Constitutional: Negative for diaphoresis, fever, malaise/fatigue and weight loss.  HENT: Negative for ear discharge, ear pain, hearing loss, nosebleeds, sore throat and tinnitus.   Eyes: Negative for blurred vision and pain.  Respiratory: Positive for cough and shortness of breath. Negative for hemoptysis and wheezing.   Cardiovascular: Negative for chest pain, palpitations, orthopnea and leg swelling.  Gastrointestinal: Negative for abdominal pain, blood in stool, constipation, diarrhea, heartburn, nausea and vomiting.  Genitourinary: Negative for dysuria, frequency and urgency.  Musculoskeletal: Negative for back pain and myalgias.  Skin: Negative for itching and rash.  Neurological: Negative for dizziness, tingling, tremors, focal weakness, seizures, weakness and headaches.  Psychiatric/Behavioral: Negative for depression. The patient is not nervous/anxious.    DRUG ALLERGIES:   Allergies  Allergen Reactions  . Penicillins Swelling, Rash and Other (See Comments)    Swelling in throat  Did it involve swelling of the face/tongue/throat, SOB, or low BP? yes Did it involve sudden or severe rash/hives, skin peeling, or any reaction on the inside of your mouth or nose? no Did you need to seek medical attention at a hospital or doctor's office? Yes When did it last happen?years  If all above answers are "NO", may proceed with cephalosporin use.    . Lidocaine Other (See Comments)    Unsure  At dentist's office   VITALS:  Blood pressure 123/87, pulse 82, temperature 97.7 F (36.5 C), temperature source Oral, resp. rate 16, height _0  (1.651 m),  weight 53.3 kg, SpO2 96 %. PHYSICAL EXAMINATION:  Physical Exam Constitutional:      Appearance: He is underweight.  HENT:     Head: Normocephalic and atraumatic.  Eyes:     Conjunctiva/sclera: Conjunctivae normal.     Pupils: Pupils are equal, round, and reactive to light.  Neck:     Thyroid: No thyromegaly.     Trachea: No tracheal deviation.  Cardiovascular:     Rate and Rhythm: Normal rate and regular rhythm.     Heart sounds: Murmur present. Systolic murmur present.  Pulmonary:     Effort: Pulmonary effort is normal. No respiratory distress.     Breath sounds: Examination of the right-upper field reveals rhonchi. Examination of the left-upper field reveals rhonchi. Examination of the right-lower field reveals decreased breath sounds. Examination of the left-lower field reveals decreased breath sounds. Decreased breath sounds and rhonchi present. No wheezing.  Chest:     Chest wall: No tenderness.  Abdominal:     General: Bowel sounds are normal. There is no distension.     Palpations: Abdomen is soft.     Tenderness: There is no abdominal tenderness.  Musculoskeletal:        General: Normal range of motion.     Cervical back: Normal range of motion and neck supple.  Skin:    General: Skin is warm and dry.     Findings: No rash.  Neurological:     Mental Status: He is alert and oriented to person, place, and time.     Cranial Nerves: No cranial nerve deficit.    LABORATORY PANEL:  Male CBC Recent Labs  Lab 04/19/19 0532  WBC 10.5  HGB 11.9*  HCT 36.8*  PLT 199   ------------------------------------------------------------------------------------------------------------------ Chemistries  Recent Labs  Lab 04/17/19 0519 04/18/19 0450 04/19/19 0532  NA 133*   < > 135  K 4.8   < > 4.6  CL 103   < > 101  CO2 20*   < > 24  GLUCOSE 138*   < > 93  BUN 16   < > 25*  CREATININE 1.04   < > 1.00  CALCIUM 8.2*   < > 8.2*  MG 2.4  --   --   AST 14*  --   --   ALT  26  --   --   ALKPHOS 75  --   --   BILITOT 0.7  --   --    < > = values in this interval not displayed.   RADIOLOGY:  No results found. ASSESSMENT AND PLAN:  Joseph Oien Mosleyis a 64 y.o.malewith a history ofadvancedpulmonary fibrosis and centrilobular bullous emphysema with grade 2D COPDwith DLCO 45%, cavitary MAC infection rt lung is being admitted for acute on chronic hypoxic resp failure   * Acute on chronic hypoxic resp failure - requiring BiPAP - likely multifactorial, secondary to MAC &severe COPD/Pulmo fibrosis - DuoNebs every 6 hours & continue home inhalers - continue Solu-Medrol IV, schedule tessalorn instead of PRN due to coughing spells - on 3 liters [place]2 (basline 2 liters?) -Aggressive bronchopulmonary hygiene protocol -PCCM to follow. None since last 2 days  * Cavitary MAC Rt lung - was recently admitted to hospital with increasing SOB on 04/07/19 and was discharge don 04/11/19 with home oxygen - doubt his compliance to MAC regimen - azithromycin/rifampin/ethambutol) - had televisit with ID on 2/16 and was recommended to space out Abx and resume it. - ID recommends space out the meds and give azithromycin with breakfast and rifampin-ethambutol spread over 12 hrs instead of all at one time  * B/l pleural effusion: Rt > Lt s/p right pleural thoracentesis on 04/08/19.  * HTN: continue losartan & metoprolol  * HLD: continue on statin  * Thoracic aortic aneurysm: CTA chest is neg for PE but shows and ascending thoracic aortic aneurysm 4.5 x 4.6 cmand also aneurysmal dilatation of descending thoracic aorta without evidence of dissection. Vascular surg previously knew about aneurysm unable to repair until MAC infection is completely resolved as a high mortality rate w/ infection of graft as per vascular surg   [facility] eval - HHPT   DVT prophylaxis: Lovenox Family Communication:  NO "discussed with patient" Disposition Plan:  Came from Home D/C back home  Barriers to DC - await clinical improvement  All the records are reviewed and case discussed with Care Management/Social Worker. Management plans discussed with the patient, nursing and they are in agreement.  CODE STATUS: Full Code  TOTAL TIME TAKING CARE OF THIS PATIENT: 35 minutes.   More than 50% of the time was spent in counseling/coordination of care: YES  POSSIBLE D/C IN 1-2 DAYS, DEPENDING ON CLINICAL CONDITION.   Max Sane M.D on 04/19/2019 at 11:47 AM  [facility]   CC: Primary care physician; Barbaraann Boys, MD  Note: This dictation was prepared with Dragon dictation along with smaller phrase technology. Any transcriptional errors that result from this process are unintentional.

## 2019-04-20 DIAGNOSIS — Z7189 Other specified counseling: Secondary | ICD-10-CM

## 2019-04-20 DIAGNOSIS — J441 Chronic obstructive pulmonary disease with (acute) exacerbation: Secondary | ICD-10-CM

## 2019-04-20 DIAGNOSIS — Z515 Encounter for palliative care: Secondary | ICD-10-CM

## 2019-04-20 DIAGNOSIS — J9621 Acute and chronic respiratory failure with hypoxia: Secondary | ICD-10-CM

## 2019-04-20 LAB — CBC
HCT: 36.4 % — ABNORMAL LOW (ref 39.0–52.0)
Hemoglobin: 11.9 g/dL — ABNORMAL LOW (ref 13.0–17.0)
MCH: 29.7 pg (ref 26.0–34.0)
MCHC: 32.7 g/dL (ref 30.0–36.0)
MCV: 90.8 fL (ref 80.0–100.0)
Platelets: 204 10*3/uL (ref 150–400)
RBC: 4.01 MIL/uL — ABNORMAL LOW (ref 4.22–5.81)
RDW: 14.6 % (ref 11.5–15.5)
WBC: 10 10*3/uL (ref 4.0–10.5)
nRBC: 0 % (ref 0.0–0.2)

## 2019-04-20 MED ORDER — SODIUM CHLORIDE 3 % IN NEBU
3.0000 mL | INHALATION_SOLUTION | Freq: Four times a day (QID) | RESPIRATORY_TRACT | Status: AC
  Administered 2019-04-20 – 2019-04-22 (×4): 3 mL via RESPIRATORY_TRACT
  Filled 2019-04-20 (×12): qty 4

## 2019-04-20 NOTE — Progress Notes (Signed)
[facility] at [place] NAME: Joseph Peters    MR#:  [id number]  DATE OF BIRTH:  July 27, 1955  SUBJECTIVE:  CHIEF COMPLAINT:   Chief Complaint  Patient presents with  . Shortness of Breath  Not feeling well.  Short of breath, cough REVIEW OF SYSTEMS:  Review of Systems  Constitutional: Negative for diaphoresis, fever, malaise/fatigue and weight loss.  HENT: Negative for ear discharge, ear pain, hearing loss, nosebleeds, sore throat and tinnitus.   Eyes: Negative for blurred vision and pain.  Respiratory: Positive for cough and shortness of breath. Negative for hemoptysis and wheezing.   Cardiovascular: Negative for chest pain, palpitations, orthopnea and leg swelling.  Gastrointestinal: Negative for abdominal pain, blood in stool, constipation, diarrhea, heartburn, nausea and vomiting.  Genitourinary: Negative for dysuria, frequency and urgency.  Musculoskeletal: Negative for back pain and myalgias.  Skin: Negative for itching and rash.  Neurological: Negative for dizziness, tingling, tremors, focal weakness, seizures, weakness and headaches.  Psychiatric/Behavioral: Negative for depression. The patient is not nervous/anxious.    DRUG ALLERGIES:   Allergies  Allergen Reactions  . Penicillins Swelling, Rash and Other (See Comments)    Swelling in throat  Did it involve swelling of the face/tongue/throat, SOB, or low BP? yes Did it involve sudden or severe rash/hives, skin peeling, or any reaction on the inside of your mouth or nose? no Did you need to seek medical attention at a hospital or doctor's office? Yes When did it last happen?years  If all above answers are "NO", may proceed with cephalosporin use.    . Lidocaine Other (See Comments)    Unsure  At dentist's office   VITALS:  Blood pressure 122/83, pulse 74, temperature 97.6 F (36.4 C), temperature source Oral, resp. rate 20, height _0  (1.651 m), weight 53.3 kg, SpO2 100  %. PHYSICAL EXAMINATION:  Physical Exam Constitutional:      Appearance: He is underweight.  HENT:     Head: Normocephalic and atraumatic.  Eyes:     Conjunctiva/sclera: Conjunctivae normal.     Pupils: Pupils are equal, round, and reactive to light.  Neck:     Thyroid: No thyromegaly.     Trachea: No tracheal deviation.  Cardiovascular:     Rate and Rhythm: Normal rate and regular rhythm.     Heart sounds: Murmur present. Systolic murmur present.  Pulmonary:     Effort: Pulmonary effort is normal. No respiratory distress.     Breath sounds: Examination of the right-upper field reveals rhonchi. Examination of the left-upper field reveals rhonchi. Examination of the right-lower field reveals decreased breath sounds. Examination of the left-lower field reveals decreased breath sounds. Decreased breath sounds and rhonchi present. No wheezing.  Chest:     Chest wall: No tenderness.  Abdominal:     General: Bowel sounds are normal. There is no distension.     Palpations: Abdomen is soft.     Tenderness: There is no abdominal tenderness.  Musculoskeletal:        General: Normal range of motion.     Cervical back: Normal range of motion and neck supple.  Skin:    General: Skin is warm and dry.     Findings: No rash.  Neurological:     Mental Status: He is alert and oriented to person, place, and time.     Cranial Nerves: No cranial nerve deficit.    LABORATORY PANEL:  Male CBC Recent Labs  Lab 04/20/19 0339  WBC 10.0  HGB 11.9*  HCT 36.4*  PLT 204   ------------------------------------------------------------------------------------------------------------------ Chemistries  Recent Labs  Lab 04/17/19 0519 04/18/19 0450 04/19/19 0532  NA 133*   < > 135  K 4.8   < > 4.6  CL 103   < > 101  CO2 20*   < > 24  GLUCOSE 138*   < > 93  BUN 16   < > 25*  CREATININE 1.04   < > 1.00  CALCIUM 8.2*   < > 8.2*  MG 2.4  --   --   AST 14*  --   --   ALT 26  --   --   ALKPHOS  75  --   --   BILITOT 0.7  --   --    < > = values in this interval not displayed.   RADIOLOGY:  No results found. ASSESSMENT AND PLAN:  Joseph Gude Mosleyis a 64 y.o.malewith a history ofadvancedpulmonary fibrosis and centrilobular bullous emphysema with grade 2D COPDwith DLCO 45%, cavitary MAC infection rt lung is being admitted for acute on chronic hypoxic resp failure   * Acute on chronic hypoxic resp failure - requiring BiPAP - likely multifactorial, secondary to MAC &severe COPD/Pulmo fibrosis - DuoNebs every 6 hours & continue home inhalers -Weaned off to 1 L today -Aggressive bronchopulmonary hygiene protocol -Appreciate pulmonary input, MetaNeb with hypertonic saline today to allow patient to expectorate thickened phlegm in preparation for discharge home.  -Prednisone taper -Incentive spirometer and Acapella flutter valve -Stopped narcotics and Tessalon per pulmonary -Continue Daliresp 210mg daily  -We will order PFT to get him qualified for trilogy at home considering his multiple readmissions   * Cavitary MAC Rt lung - was recently admitted to hospital with increasing SOB on 04/07/19 and was discharge don 04/11/19 with home oxygen - doubt his compliance to MAC regimen - azithromycin/rifampin/ethambutol) -He has set him up for afflo vest therapy to be used twice daily 20 to 30-minute sessions - ID following.  They recommend to space out the meds and give azithromycin with breakfast and rifampin-ethambutol spread over 12 hrs instead of all at one time  * B/l pleural effusion: Rt > Lt s/p right pleural thoracentesis on 04/08/19. -We will request ultrasound-guided thoracentesis  * HTN: continue losartan & metoprolol  * HLD: continue on statin  * Thoracic aortic aneurysm: CTA chest is neg for PE but shows and ascending thoracic aortic aneurysm 4.5 x 4.6 cmand also aneurysmal dilatation of descending thoracic aorta without evidence of dissection. Vascular surg  previously knew about aneurysm unable to repair until MAC infection is completely resolved as a high mortality rate w/ infection of graft as per vascular surg   Moderate protein calorie malnutrition  PT/OT eval - HHPT   DVT prophylaxis: Lovenox Family Communication:  NO "discussed with patient" Disposition Plan:  Came from Home D/C back home with home health and palliative care to follow Barriers to DC - await clinical improvement, pulmonary and ID still actively managing his pulmonary issues, getting PFT today to set him up for trilogy at home  All the records are reviewed and case discussed with Care Management/Social Worker. Management plans discussed with the patient, nursing and they are in agreement.  CODE STATUS: Full Code  TOTAL TIME TAKING CARE OF THIS PATIENT: 35 minutes.   More than 50% of the time was spent in counseling/coordination of care: YES  POSSIBLE D/C IN 1-2 DAYS, DEPENDING ON CLINICAL CONDITION.   VMax SaneM.D on 04/20/2019  at 5:04 PM  [facility]   CC: Primary care physician; Barbaraann Boys, MD  Note: This dictation was prepared with Dragon dictation along with smaller phrase technology. Any transcriptional errors that result from this process are unintentional.

## 2019-04-20 NOTE — TOC Progression Note (Signed)
Transition of Care [facility]) - Progression Note    Patient Details  Name: Joseph Peters MRN: [id number] Date of Birth: [birth year]  [facility] CM/SW Pierce, RN Phone Number: 04/20/2019, 9:50 AM  Clinical Narrative:   Patient is open with [facility] and will continue, has Home O2, Alfovest delivered to the room, Brad with [employer] monitoring for possible Trilogy, will continue to monitor for needs         Expected Discharge Plan and Services                                                 Social Determinants of Health (SDOH) Interventions    Readmission Risk Interventions No flowsheet data found.

## 2019-04-20 NOTE — Progress Notes (Signed)
Ch visited with Pt as a follow-up. Pt had requested for Ch visits every day. Pt reported that his lungs are not doing too good. Pt also said that he did not want family visiting him because of his concerns associated to COVID infection form the hospital for them. Pt expressed wishes to speak with Ch. Waters. Ch let pt know that she will let Ch. Waters know to come visit him. Pt requested prayer. Ch prayed with Pt and left.    04/20/19 1100  Clinical Encounter Type  Visited With Patient  Visit Type Follow-up;Psychological support;Spiritual support  Referral From Patient;Chaplain  Consult/Referral To Chaplain  Spiritual Encounters  Spiritual Needs Prayer;Emotional  Stress Factors  Patient Stress Factors Exhausted;Health changes;Loss of control;Major life changes

## 2019-04-20 NOTE — Consult Note (Signed)
Consultation Note Date: 04/20/2019   Patient Name: Joseph Peters  DOB: [birth year]  MRN: [id number]  Age / Sex: 64 y.o., male  PCP: Joseph Boys, MD Referring Physician: Max Sane, MD  Reason for Consultation: Establishing goals of care  HPI/Patient Profile: 64 y.o. male  with past medical history of severe COPD, recent admission for MAC pnuemonia, alcoholism, anxiety, chronic pain, IBS admitted on 04/16/2019 with increasing shortness of breath. 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  Clinical Assessment and Goals of Care:  I have reviewed medical records including EPIC notes, labs and imaging, examined the patient and met at bedside to discuss diagnosis prognosis, GOC, EOL wishes, disposition and options.  I introduced Palliative Medicine as specialized medical care for people living with serious illness. It focuses on providing relief from the symptoms and stress of a serious illness.   We discussed a brief life review of the patient. He is married. He has a spouse who has what sounds like early stage dementia. Previously worked in Architect, however, is now disabled.   As far as functional and nutritional status- he notes decline over last several months. He is independent with ADL's, however gets short of breath easily. He expresses that he has had to ask for help for completing some tasks around his home.    We discussed his current illness and what it means in the larger context of his on-going co-morbidities.  Natural disease trajectory and expectations at EOL were discussed.   I attempted to elicit values and goals of care important to the patient. Joseph Peters expressed a desire to remain independent. If he were to decline to a point where he was unable to care for himself- he states he would rather die. He absolutely  would not want to live in a nursing facility.    The difference between aggressive medical intervention and comfort care was considered in light of the patient's goals of care.   Advanced directives, concepts specific to code status, artifical feeding and hydration, and rehospitalization were considered and discussed. At this point patient wishes to continue all aggressive medical care. He is full code, however, stated he would discuss possible DNR with his spouse- we discussed that if his body failed to the point that his heart stopped and he stopped breathing- in the setting of his very damaged lungs- it is unlikely he would get back to a point where he would be able to care for himself.   Hospice and Palliative Care services outpatient were explained and offered. Joseph Peters would appreciate Palliative Care followup at home after discharge for continued discussion regarding goals of care. He does understand he has an endstage illness and he expresses concern that he will not recover and will continue to decline. However, he does not feel he is at the point of transitioning to comfort care yet. We discussed that continued discussion will be helpful and his preferences in the future may change regarding  his advanced care planning decisions.  Questions and concerns were addressed.  The family was encouraged to call with questions or concerns.    Primary Decision Maker PATIENT    SUMMARY OF RECOMMENDATIONS -Continue current plan of care -TOC referral for outpatient Palliative to see at home    Code Status/Advance Care Planning:  Full code  Prognosis:    Unable to determine likely less then six months given his advanced COPD and diagnosis of MAC  Discharge Planning: Home with Palliative Services  Primary Diagnoses: Present on Admission: . Acute respiratory failure ([place])   I have reviewed the medical record, interviewed the patient and family, and examined the patient. The following  aspects are pertinent.  Past Medical History:  Diagnosis Date  . AAA (abdominal aortic aneurysm) without rupture ([place])   . Abdominal aneurysm ([place]) 2015   being followed but not large enough to surgically treat  . Alcoholism ([place])   . Anxiety   . Arthritis   . Asthma   . Benign hypertension 07/16/2014  . Chest pain on exertion 07/16/2014  . Chronic pain syndrome   . Coccydynia 02/08/2012  . COPD (chronic obstructive pulmonary disease) ([place])    patient smokes  . Coronary artery disease   . 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 Depression   . 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 Dyspnea   . Frequent falls   . GERD (gastroesophageal reflux disease)   . Hyperlipidemia   . IBS (irritable bowel syndrome)   . Myocardial infarction ([place]) 2010  . Neuropathy   . Pneumonia   . Prostatitis   . Restless leg syndrome   . Sciatica   . Spinal stenosis of lumbar region   . Stroke ([place]) 2015   no residual symptoms  . Tremor, essential    Social History   Socioeconomic History  . Marital status: Married    Spouse name: Not on file  . Number of children: Not on file  . Years of education: Not on file  . Highest education level: Not on file  Occupational History  . Not on file  Tobacco Use  . Smoking status: Light Tobacco Smoker    Packs/day: 0.50    Years: 30.00    Pack years: 15.00    Types: Cigarettes    Start date: 02/26/1973    Last attempt to quit: 09/21/2018    Years since quitting: 0.5  . Smokeless tobacco: Former Systems developer    Quit date: 02/26/1998  . Tobacco comment: smokes 2-3 cig a week 02/12/19  Substance and Sexual Activity  . Alcohol use: Yes    Alcohol/week: 20.0 standard drinks    Types: 20 Cans of beer per week    Comment: week   . Drug use: Yes    Comment: prescribed hydrocodone  . Sexual activity: Not on file  Other Topics Concern  . Not on file  Social History Narrative   Patient is a Nature conservation officer.;  He lives with  his wife at home.  Longstanding history of smoking; quit recently.  Denies alcohol abuse.   Social Determinants of Health   Financial Resource Strain:   . Difficulty of Paying Living Expenses: Not on file  Food Insecurity:   . Worried About Charity fundraiser in the Last Year: Not on file  . Ran Out of Food in the Last Year: Not on file  Transportation Needs:   . Lack of Transportation (Medical): Not on file  .  Lack of Transportation (Non-Medical): Not on file  Physical Activity:   . Days of Exercise per Week: Not on file  . Minutes of Exercise per Session: Not on file  Stress:   . Feeling of Stress : Not on file  Social Connections:   . Frequency of Communication with Friends and Family: Not on file  . Frequency of Social Gatherings with Friends and Family: Not on file  . Attends Religious Services: Not on file  . Active Member of Clubs or Organizations: Not on file  . Attends Archivist Meetings: Not on file  . Marital Status: Not on file   Family History  Problem Relation Age of Onset  . Cancer Mother   . COPD Father 53       black lung   Scheduled Meds: . azithromycin  500 mg Oral Q0600  . Chlorhexidine Gluconate Cloth  6 each Topical Daily  . chloroprocaine  10 mL Infiltration Once  . enoxaparin (LOVENOX) injection  40 mg Subcutaneous Q24H  . ethambutol  400 mg Oral BID  . feeding supplement (ENSURE ENLIVE)  237 mL Oral TID BM  . finasteride  5 mg Oral Daily  . FLUoxetine  20 mg Oral Daily  . gabapentin  900 mg Oral QHS  . ipratropium-albuterol  3 mL Nebulization Q6H  . losartan  25 mg Oral Daily  . mouth rinse  15 mL Mouth Rinse BID  . metoprolol succinate  25 mg Oral Daily  . montelukast  10 mg Oral QHS  . multivitamin with minerals  1 tablet Oral Daily  . predniSONE  40 mg Oral Q breakfast  . rifampin  300 mg Oral BID  . roflumilast  250 mcg Oral Daily  . sodium chloride HYPERTONIC  3 mL Nebulization Q6H WA  . tamsulosin  0.4 mg Oral QPC supper    Continuous Infusions: . sodium chloride Stopped (04/16/19 1450)   PRN Meds:.HYDROcodone-acetaminophen, ipratropium-albuterol, ondansetron, traZODone Medications Prior to Admission:  Prior to Admission medications   Medication Sig Start Date End Date Taking? Authorizing Provider  albuterol (PROVENTIL HFA;VENTOLIN HFA) 108 (90 Base) MCG/ACT inhaler Inhale 2 puffs into the lungs every 4 (four) hours as needed for wheezing or shortness of breath.    Yes [provider]  azithromycin (ZITHROMAX) 500 MG tablet Take 1 tablet (500 mg total) by mouth daily. 04/11/19  Yes Wyvonnia Dusky, MD  DALIRESP 250 MCG TABS Take 250 mcg by mouth at bedtime. 10/17/18  Yes Ojie, Jude, MD  ethambutol (MYAMBUTOL) 400 MG tablet Take 2 tablets (800 mg total) by mouth daily. 04/11/19  Yes Wyvonnia Dusky, MD  finasteride (PROSCAR) 5 MG tablet Take 1 tablet (5 mg total) by mouth daily. 03/02/19  Yes Festus Aloe, MD  FLUoxetine (PROZAC) 20 MG capsule Take 20 mg by mouth daily.    Yes [provider]  Fluticasone-Umeclidin-Vilant (TRELEGY ELLIPTA) 100-62.5-25 MCG/INH AEPB Inhale 2 puffs into the lungs at bedtime.  08/18/18  Yes [provider]  gabapentin (NEURONTIN) 300 MG capsule Take 900 mg by mouth at bedtime.    Yes [provider]  losartan (COZAAR) 25 MG tablet Take 25 mg by mouth daily.    Yes [provider]  metoprolol succinate (TOPROL-XL) 25 MG 24 hr tablet Take 1 tablet (25 mg total) by mouth daily. 04/12/19 05/12/19 Yes Wyvonnia Dusky, MD  montelukast (SINGULAIR) 10 MG tablet Take 10 mg by mouth at bedtime.   Yes [provider]  omeprazole ([place])  20 MG capsule Take 20 mg by mouth daily.   Yes [provider]  ondansetron (ZOFRAN ODT) 4 MG disintegrating tablet Take 1 tablet (4 mg total) by mouth every 8 (eight) hours as needed for nausea or vomiting. 04/02/19  Yes Tsosie Billing, MD  Oxycodone HCl 10 MG TABS Take 10 mg by  mouth 4 (four) times daily as needed for pain. 09/29/18  Yes [provider]  rifampin (RIFADIN) 300 MG capsule Take 2 capsules (600 mg total) by mouth daily. 04/12/19 05/12/19 Yes Wyvonnia Dusky, MD  rosuvastatin (CRESTOR) 5 MG tablet Take 5 mg by mouth at bedtime.   Yes [provider]  tamsulosin (FLOMAX) 0.4 MG CAPS capsule Take 1 capsule (0.4 mg total) by mouth daily after supper. 12/22/18  Yes Festus Aloe, MD   Allergies  Allergen Reactions  . Penicillins Swelling, Rash and Other (See Comments)    Swelling in throat  Did it involve swelling of the face/tongue/throat, SOB, or low BP? yes Did it involve sudden or severe rash/hives, skin peeling, or any reaction on the inside of your mouth or nose? no Did you need to seek medical attention at a hospital or doctor's office? Yes When did it last happen?years  If all above answers are "NO", may proceed with cephalosporin use.    . Lidocaine Other (See Comments)    Unsure  At dentist's office   Review of Systems  Constitutional: Positive for activity change and appetite change. Negative for unexpected weight change.  Respiratory: Positive for shortness of breath.     Physical Exam Vitals and nursing note reviewed.  Constitutional:      Comments: appers older than stated age, thin, frail   HENT:     Head: Normocephalic and atraumatic.  Neurological:     General: No focal deficit present.     Mental Status: He is oriented to person, place, and time.  Psychiatric:        Behavior: Behavior normal.     Vital Signs: BP 122/83 (BP Location: Right Arm)   Pulse 74   Temp 97.6 F (36.4 C) (Oral)   Resp 20   Ht _0  (1.651 m)   Wt 53.3 kg   SpO2 100%   BMI 19.55 kg/m  Pain Scale: 0-10   Pain Score: 5    SpO2: SpO2: 100 % O2 Device:SpO2: 100 % O2 Flow Rate: .O2 Flow Rate (L/min): 1 L/min  IO: Intake/output summary:   Intake/Output Summary (Last 24 hours) at 04/20/2019 1633 Last data  filed at 04/20/2019 1300 Gross per 24 hour  Intake 600 ml  Output --  Net 600 ml    LBM: Last BM Date: 04/20/19 Baseline Weight: Weight: 54.3 kg Most recent weight: Weight: 53.3 kg     Palliative Assessment/Data: PPS: 70%     Thank you for this consult. Palliative medicine will continue to follow and assist as needed.   Time In: 1530 Time Out: 1640 Time Total: 70 mins Greater than 50%  of this time was spent counseling and coordinating care related to the above assessment and plan.  Signed by: Mariana Kaufman, AGNP-C Palliative Medicine    Please contact Palliative Medicine Team phone at [phone] for questions and concerns.  For individual provider: See Shea Evans

## 2019-04-20 NOTE — Progress Notes (Addendum)
Pulmonary Medicine          Date: 04/20/2019,   MRN# [id number] Joseph Peters 1955/05/28     AdmissionWeight: 54.3 kg                 CurrentWeight: 53.3 kg   Referring Physician: Dr Jimmye Norman   CHIEF COMPLAINT:   Advanced Emphysema with Mycobacterium Avium Complex    HISTORY OF PRESENT ILLNESS   Patient laying in bed, states he feels tired.  He is on 3.5L/min [place].  Optimizing for possible discharge home with close follow-up in pulmonary clinic.  PAST MEDICAL HISTORY   Past Medical History:  Diagnosis Date  . AAA (abdominal aortic aneurysm) without rupture ([place])   . Abdominal aneurysm ([place]) 2015   being followed but not large enough to surgically treat  . Alcoholism ([place])   . Anxiety   . Arthritis   . Asthma   . Benign hypertension 07/16/2014  . Chest pain on exertion 07/16/2014  . Chronic pain syndrome   . Coccydynia 02/08/2012  . COPD (chronic obstructive pulmonary disease) ([place])    patient smokes  . Coronary artery disease   . 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 Depression   . 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 Dyspnea   . Frequent falls   . GERD (gastroesophageal reflux disease)   . Hyperlipidemia   . IBS (irritable bowel syndrome)   . Myocardial infarction ([place]) 2010  . Neuropathy   . Pneumonia   . Prostatitis   . Restless leg syndrome   . Sciatica   . Spinal stenosis of lumbar region   . Stroke ([place]) 2015   no residual symptoms  . Tremor, essential      SURGICAL HISTORY   Past Surgical History:  Procedure Laterality Date  . CARDIAC CATHETERIZATION  2012  . COLONOSCOPY WITH PROPOFOL N/A 11/14/2016   Procedure: COLONOSCOPY WITH PROPOFOL;  Surgeon: Manya Silvas, MD;  Location: [facility] ENDOSCOPY;  Service: Endoscopy;  Laterality: N/A;  . ESOPHAGOGASTRODUODENOSCOPY (EGD) WITH PROPOFOL N/A 11/14/2016   Procedure: ESOPHAGOGASTRODUODENOSCOPY (EGD) WITH PROPOFOL;  Surgeon: Manya Silvas, MD;   Location: [facility] ENDOSCOPY;  Service: Endoscopy;  Laterality: N/A;  . JOINT REPLACEMENT Right    hip  . KNEE SURGERY     right  . SHOULDER ARTHROSCOPY WITH OPEN ROTATOR CUFF REPAIR Right 08/20/2017   Procedure: SHOULDER ARTHROSCOPY WITH OPEN ROTATOR CUFF REPAIR;  Surgeon: Corky Mull, MD;  Location: [facility] ORS;  Service: Orthopedics;  Laterality: Right;  . SHOULDER ARTHROSCOPY WITH SUBACROMIAL DECOMPRESSION, ROTATOR CUFF REPAIR AND BICEP TENDON REPAIR Left 05/10/2016   Procedure: SHOULDER ARTHROSCOPY WITH DEBTRIDEMENT, DECOMPRESSION, REPAIR OF MASSIVE ROTATOR CUFF TEAR AND BICEP TENODESIS;  Surgeon: Corky Mull, MD;  Location: [facility] ORS;  Service: Orthopedics;  Laterality: Left;  Massive cuff tear  . TOTAL HIP ARTHROPLASTY Right 07/01/2018   Procedure: TOTAL HIP ARTHROPLASTY - RIGHT;  Surgeon: Corky Mull, MD;  Location: [facility] ORS;  Service: Orthopedics;  Laterality: Right;  Marland Kitchen VIDEO BRONCHOSCOPY WITH ENDOBRONCHIAL NAVIGATION N/A 02/13/2019   Procedure: VIDEO BRONCHOSCOPY WITH ENDOBRONCHIAL NAVIGATION;  Surgeon: Ottie Glazier, MD;  Location: [facility] ORS;  Service: Thoracic;  Laterality: N/A;  . VIDEO BRONCHOSCOPY WITH ENDOBRONCHIAL NAVIGATION N/A 03/13/2019   Procedure: VIDEO BRONCHOSCOPY WITH ENDOBRONCHIAL NAVIGATION;  Surgeon: Ottie Glazier, MD;  Location: [facility] ORS;  Service: Thoracic;  Laterality: N/A;  . VIDEO BRONCHOSCOPY WITH ENDOBRONCHIAL ULTRASOUND N/A 02/13/2019   Procedure: VIDEO  BRONCHOSCOPY WITH ENDOBRONCHIAL ULTRASOUND;  Surgeon: Ottie Glazier, MD;  Location: [facility] ORS;  Service: Thoracic;  Laterality: N/A;  . VIDEO BRONCHOSCOPY WITH ENDOBRONCHIAL ULTRASOUND N/A 03/13/2019   Procedure: VIDEO BRONCHOSCOPY WITH ENDOBRONCHIAL ULTRASOUND;  Surgeon: Ottie Glazier, MD;  Location: [facility] ORS;  Service: Thoracic;  Laterality: N/A;     FAMILY HISTORY   Family History  Problem Relation Age of Onset  . Cancer Mother   . COPD Father 45       black lung     SOCIAL HISTORY   Social History    Tobacco Use  . Smoking status: Light Tobacco Smoker    Packs/day: 0.50    Years: 30.00    Pack years: 15.00    Types: Cigarettes    Start date: 02/26/1973    Last attempt to quit: 09/21/2018    Years since quitting: 0.5  . Smokeless tobacco: Former Systems developer    Quit date: 02/26/1998  . Tobacco comment: smokes 2-3 cig a week 02/12/19  Substance Use Topics  . Alcohol use: Yes    Alcohol/week: 20.0 standard drinks    Types: 20 Cans of beer per week    Comment: week   . Drug use: Yes    Comment: prescribed hydrocodone     MEDICATIONS    Home Medication:    Current Medication:  Current Facility-Administered Medications:  .  azithromycin (ZITHROMAX) tablet 500 mg, 500 mg, Oral, Q0600, Tsosie Billing, MD, 500 mg at 04/20/19 0454 .  benzonatate (TESSALON) capsule 200 mg, 200 mg, Oral, TID, Max Sane, MD, 200 mg at 04/19/19 2059 .  Chlorhexidine Gluconate Cloth 2 % PADS 6 each, 6 each, Topical, Daily, Max Sane, MD, 6 each at 04/19/19 [phone] .  chloroprocaine (NESACAINE-MPF) 2 % injection 10 mL, 10 mL, Infiltration, Once, Matthews, Herma Carson, PA .  chlorpheniramine-HYDROcodone (TUSSIONEX) 10-8 MG/5ML suspension 5 mL, 5 mL, Oral, Q12H PRN, Awilda Bill, NP, 5 mL at 04/19/19 0941 .  enoxaparin (LOVENOX) injection 40 mg, 40 mg, Subcutaneous, Q24H, Blakeney, Dreama Saa, NP, 40 mg at 04/19/19 1834 .  ethambutol (MYAMBUTOL) tablet 400 mg, 400 mg, Oral, BID, Ravishankar, Jayashree, MD, 400 mg at 04/19/19 2058 .  feeding supplement (ENSURE ENLIVE) (ENSURE ENLIVE) liquid 237 mL, 237 mL, Oral, TID BM, Max Sane, MD, 237 mL at 04/19/19 2059 .  finasteride (PROSCAR) tablet 5 mg, 5 mg, Oral, Daily, Max Sane, MD, 5 mg at 04/19/19 0933 .  FLUoxetine (PROZAC) capsule 20 mg, 20 mg, Oral, Daily, Max Sane, MD, 20 mg at 04/19/19 0937 .  gabapentin (NEURONTIN) capsule 900 mg, 900 mg, Oral, QHS, Max Sane, MD, 900 mg at 04/19/19 2059 .  HYDROcodone-acetaminophen (NORCO/VICODIN) 5-325 MG  per tablet 1-2 tablet, 1-2 tablet, Oral, Q6H PRN, Awilda Bill, NP, 1 tablet at 04/16/19 2250 .  ipratropium-albuterol (DUONEB) 0.5-2.5 (3) MG/3ML nebulizer solution 3 mL, 3 mL, Nebulization, Q6H, Blakeney, Dana G, NP, 3 mL at 04/20/19 0224 .  ipratropium-albuterol (DUONEB) 0.5-2.5 (3) MG/3ML nebulizer solution 3 mL, 3 mL, Nebulization, Q6H PRN, Awilda Bill, NP .  losartan (COZAAR) tablet 25 mg, 25 mg, Oral, Daily, Max Sane, MD, 25 mg at 04/19/19 0933 .  MEDLINE mouth rinse, 15 mL, Mouth Rinse, BID, Awilda Bill, NP, 15 mL at 04/19/19 2059 .  metoprolol succinate (TOPROL-XL) 24 hr tablet 25 mg, 25 mg, Oral, Daily, Max Sane, MD, 25 mg at 04/19/19 0941 .  montelukast (SINGULAIR) tablet 10 mg, 10 mg, Oral, QHS, Manuella Ghazi, Vipul, MD, 10 mg at  04/19/19 2058 .  morphine 2 MG/ML injection 1-2 mg, 1-2 mg, Intravenous, Q4H PRN, Awilda Bill, NP, 2 mg at 04/19/19 2103 .  multivitamin with minerals tablet 1 tablet, 1 tablet, Oral, Daily, Max Sane, MD, 1 tablet at 04/19/19 0933 .  ondansetron (ZOFRAN-ODT) disintegrating tablet 4 mg, 4 mg, Oral, Q8H PRN, Max Sane, MD, 4 mg at 04/17/19 1025 .  oxyCODONE (Oxy IR/ROXICODONE) immediate release tablet 10 mg, 10 mg, Oral, QID PRN, Manuella Ghazi, Vipul, MD .  pantoprazole (PROTONIX) EC tablet 40 mg, 40 mg, Oral, Daily, Max Sane, MD, 40 mg at 04/19/19 0933 .  [COMPLETED] methylPREDNISolone sodium succinate (SOLU-MEDROL) 125 mg/2 mL injection 60 mg, 60 mg, Intravenous, Q6H, 60 mg at 04/17/19 1630 **FOLLOWED BY** predniSONE (DELTASONE) tablet 40 mg, 40 mg, Oral, Q breakfast, Max Sane, MD, 40 mg at 04/19/19 0819 .  rifampin (RIFADIN) capsule 300 mg, 300 mg, Oral, BID, Ravishankar, Jayashree, MD, 300 mg at 04/19/19 2058 .  roflumilast (DALIRESP) tablet 250 mcg, 250 mcg, Oral, Daily, Max Sane, MD, 250 mcg at 04/19/19 0934 .  sodium chloride 0.9 % bolus 1,000 mL, 1,000 mL, Intravenous, Once, Duffy Bruce, MD, Stopped at 04/16/19 1450 .  tamsulosin  (FLOMAX) capsule 0.4 mg, 0.4 mg, Oral, QPC supper, Max Sane, MD, 0.4 mg at 04/19/19 1833 .  traZODone (DESYREL) tablet 50 mg, 50 mg, Oral, QHS PRN, Awilda Bill, NP, 50 mg at 04/17/19 0120    ALLERGIES   Penicillins and Lidocaine     REVIEW OF SYSTEMS    Review of Systems:  Gen:  Denies  fever, sweats, chills weigh loss  HEENT: Denies blurred vision, double vision, ear pain, eye pain, hearing loss, nose bleeds, sore throat Cardiac:  No dizziness, chest pain or heaviness, chest tightness,edema Resp:   Denies cough or sputum porduction, shortness of breath,wheezing, hemoptysis,  Gi: Denies swallowing difficulty, stomach pain, nausea or vomiting, diarrhea, constipation, bowel incontinence Gu:  Denies bladder incontinence, burning urine Ext:   Denies Joint pain, stiffness or swelling Skin: Denies  skin rash, easy bruising or bleeding or hives Endoc:  Denies polyuria, polydipsia , polyphagia or weight change Psych:   Denies depression, insomnia or hallucinations   Other:  All other systems negative   VS: BP (!) 134/95 (BP Location: Right Arm)   Pulse 97   Temp 97.7 F (36.5 C)   Resp 20   Ht _0  (1.651 m)   Wt 53.3 kg   SpO2 95%   BMI 19.55 kg/m      PHYSICAL EXAM    GENERAL:NAD, no fevers, chills, no weakness no fatigue HEAD: Normocephalic, atraumatic.  EYES: Pupils equal, round, reactive to light. Extraocular muscles intact. No scleral icterus.  MOUTH: Moist mucosal membrane. Dentition intact. No abscess noted.  EAR, NOSE, THROAT: Clear without exudates. No external lesions.  NECK: Supple. No thyromegaly. No nodules. No JVD.  PULMONARY: Bilateral rhonchorous breath sounds worse on the right and crackles at the right. CARDIOVASCULAR: S1 and S2. Regular rate and rhythm. No murmurs, rubs, or gallops. No edema. Pedal pulses 2+ bilaterally.  GASTROINTESTINAL: Soft, nontender, nondistended. No masses. Positive bowel sounds. No hepatosplenomegaly.    MUSCULOSKELETAL: No swelling, clubbing, or edema. Range of motion full in all extremities.  NEUROLOGIC: Cranial nerves II through XII are intact. No gross focal neurological deficits. Sensation intact. Reflexes intact.  SKIN: No ulceration, lesions, rashes, or cyanosis. Skin warm and dry. Turgor intact.  PSYCHIATRIC: Mood, affect within normal limits. The patient is awake, alert and  oriented x 3. Insight, judgment intact.       IMAGING    DG Chest 2 View  Result Date: 04/07/2019 CLINICAL DATA:  Chest pain and shortness of breath EXAM: CHEST - 2 VIEW COMPARISON:  March 13, 2019 chest radiograph and chest CT February 13, 2019 FINDINGS: There remains consolidation with volume loss in the right upper lobe with slightly improved aeration in this area compared to prior studies. Elsewhere, there is extensive underlying emphysematous change with fibrosis. Fibrosis is most severe in the right base region. No new opacity evident. Heart size and pulmonary vascularity are normal. No adenopathy is evident. There is aortic atherosclerosis. No bone lesions. There is calcification in each carotid artery. IMPRESSION: Underlying emphysematous change in fibrosis. Consolidation in the right upper lobe is slightly less pronounced than on recent prior studies. No new opacity evident. Stable cardiac silhouette. No adenopathy demonstrable by radiography. Aortic Atherosclerosis (ICD10-I70.0). Electronically Signed   By: Lowella Grip III M.D.   On: 04/07/2019 11:20   CT Angio Chest PE W and/or Wo Contrast  Result Date: 04/16/2019 CLINICAL DATA:  Shortness of breath and hypoxia EXAM: CT ANGIOGRAPHY CHEST WITH CONTRAST TECHNIQUE: Multidetector CT imaging of the chest was performed using the standard protocol during bolus administration of intravenous contrast. Multiplanar CT image reconstructions and MIPs were obtained to evaluate the vascular anatomy. CONTRAST:  41m OMNIPAQUE IOHEXOL 350 MG/ML SOLN COMPARISON:   04/07/2019. FINDINGS: Cardiovascular: Atheromatous calcifications, including the coronary arteries and aorta. Normally opacified pulmonary arteries with no pulmonary arterial filling defects seen. The ascending thoracic aorta measures 4.5 cm in maximum diameter, previously 4.6 cm. Aneurysmal dilatation of the descending thoracic aorta with a maximum diameter of 3.5 cm, previously 3.8 cm. Aneurysmal dilatation of the infrarenal abdominal aorta with a maximum diameter of 4.0 cm on the included portions. Mediastinum/Nodes: Mildly enlarged right hilar lymph nodes with little change. The largest measures 1.1 cm in short axis diameter on image number 55 series 4. Thyroid gland, trachea, and esophagus demonstrate no significant findings. Lungs/Pleura: Stable marked bilateral centrilobular bullous changes. Previously demonstrated right upper lobe consolidation with air bronchograms and focal distal bronchial dilatation without significant change. Small amount of consolidation in the posterior left lower lobe with little change. Moderate-sized right pleural effusion and small to moderate-sized left pleural effusion, both increased. Upper Abdomen: Dilated in for abdominal aorta as described above, not included in its entirety. Musculoskeletal: Stable 20% inferior endplate compression deformity of a midthoracic vertebral body. Review of the MIP images confirms the above findings. IMPRESSION: 1. No pulmonary emboli. 2. Stable right upper lobe consolidation with air bronchograms and focal distal bronchial dilatation. 3. Stable small amount of consolidation in the posterior left lower lobe. 4. Stable mild right hilar adenopathy, most likely reactive. 5. Stable marked changes of COPD. 6. Stable aneurysmal dilatation of the ascending thoracic aorta and descending thoracic aorta with interval included aneurysmal dilatation of the infrarenal abdominal aorta, not included in its entirety, measuring 4.0 cm. 7. Moderate-sized right  pleural effusion and small to moderate-sized left pleural effusion, both increased. 8. Calcific coronary artery and aortic atherosclerosis. Aortic Atherosclerosis (ICD10-I70.0) and Emphysema (ICD10-J43.9). Electronically Signed   By: SClaudie ReveringM.D.   On: 04/16/2019 16:28   CT Angio Chest PE W/Cm &/Or Wo Cm  Result Date: 04/07/2019 CLINICAL DATA:  Shortness of breath. EXAM: CT ANGIOGRAPHY CHEST WITH CONTRAST TECHNIQUE: Multidetector CT imaging of the chest was performed using the standard protocol during bolus administration of intravenous contrast. Multiplanar CT image  reconstructions and MIPs were obtained to evaluate the vascular anatomy. CONTRAST:  42m OMNIPAQUE IOHEXOL 350 MG/ML SOLN COMPARISON:  Chest radiograph April 07, 2019; chest CT February 13, 2019 FINDINGS: Cardiovascular: There is no demonstrable pulmonary embolus. Ascending thoracic aortic diameter measures 4.6 x 4.5 cm. No dissection is appreciable. The contrast bolus in the aorta is somewhat less than optimal for dissection assessment. There is localized aneurysmal dilatation in the distal descending thoracic aorta at the diaphragmatic hiatus with localized plaque and calcification along the leftward aspect of this aneurysm. The descending thoracic aorta at the level of the diaphragmatic hiatus measures 3.8 x 3.1 cm. Appearance of this aneurysm is similar to most recent CT. There is aortic atherosclerosis as well as scattered foci of calcification in proximal visualized great vessels. There are foci of coronary artery calcification. There is left ventricular hypertrophy. No pericardial effusion or pericardial thickening. Mediastinum/Nodes: Thyroid appears unremarkable. There are mildly prominent right hilar lymph nodes, largest measuring 1.5 x 1.3 cm. Subcarinal lymph node measures 1.4 x 1.2 cm. No esophageal lesions are evident. Lungs/Pleura: There is a moderate right pleural effusion with a much smaller left pleural effusion. There is  extensive underlying emphysematous change with fibrosis in the lower lung regions, significantly more on the right than on the left. There are multiple areas of cicatrization throughout the lungs bilaterally, more severe on the right than on the left. There is consolidation in the anterior segment right upper lobe with air bronchograms and volume loss. No similar consolidation elsewhere. Upper Abdomen: There is upper abdominal aortic atherosclerosis. Visualized upper abdominal structures otherwise appear unremarkable. Musculoskeletal: Anterior wedging of a midthoracic vertebral body is stable. No blastic or lytic bone lesions. No evident chest wall lesions. Review of the MIP images confirms the above findings. IMPRESSION: 1.  No demonstrable pulmonary embolus. 2. Prominence of the ascending thoracic aorta with a measured diameter of 4.6 x 4.5 cm. Ascending thoracic aortic aneurysm. Recommend semi-annual imaging followup by CTA or MRA and referral to cardiothoracic surgery if not already obtained. This recommendation follows 2010 ACCF/AHA/AATS/ACR/ASA/SCA/SCAI/SIR/STS/SVM Guidelines for the Diagnosis and Management of Patients With Thoracic Aortic Disease. Circulation. 2010; 121:: [id number] Aortic aneurysm NOS (ICD10-I71.9). No dissection evident with contrast bolus somewhat less than optimal for dissection assessment. 3. Localized aneurysmal dilatation of the descending thoracic aorta at the diaphragmatic hiatus level measuring 3.8 x 3.1 cm. Localized plaque and dilatation along the leftward aspect of the aneurysm, also present previously. Note that there is aortic atherosclerosis as well as foci of great vessel and coronary artery calcification. 4. 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 Pleural effusions bilaterally, larger on the right than on the left. Persistent consolidation in the right upper lobe anteriorly with air bronchograms. Appearance most consistent with  pneumonia with possibility of superimposed neoplasm not excluded anteriorly toward the apex on the right. 5.  Prominent right hilar and subcarinal lymph nodes. Aortic Atherosclerosis (ICD10-I70.0) and Emphysema (ICD10-J43.9). Aortic aneurysm NOS (ICD10-I71.9). Electronically Signed   By: WLowella GripIII M.D.   On: 04/07/2019 13:17   DG Chest Port 1 View  Result Date: 04/17/2019 CLINICAL DATA:  Right-sided thoracentesis EXAM: PORTABLE CHEST 1 VIEW COMPARISON:  Yesterday FINDINGS: Interstitial and airspace opacity in the right upper lobe with volume loss. Normal heart size and stable mediastinal contours. No visible pneumothorax. Trace left pleural effusion. IMPRESSION: 1. No complicating features after thoracentesis. 2. Emphysema and stable right upper lobe opacity with volume loss. Electronically Signed  By: Monte Fantasia M.D.   On: 04/17/2019 11:17   DG Chest Portable 1 View  Result Date: 04/16/2019 CLINICAL DATA:  Shortness of breath EXAM: PORTABLE CHEST 1 VIEW COMPARISON:  April 08, 2019 chest radiograph and chest CT Jul 05 2019 FINDINGS: Extensive fibrosis is noted throughout the lungs bilaterally. There may be a degree of superimposed interstitial edema given increase in interstitial thickening compared to recent CT examination. There is equivocal increase in interstitial thickening bilaterally compared to most recent chest radiograph. There may be patchy airspace opacity intermingled with interstitial prominence. There remains extensive consolidation in the right upper lobe. Heart size and pulmonary vascularity are stable and within normal limits given the underlying fibrosis. No adenopathy is appreciable by radiography. No bone lesions. IMPRESSION: Persistent extensive fibrosis with suspected superimposed interstitial edema and possible interspersed airspace opacity. The degree of ill-defined opacity, primarily in the left lower lobe is modestly increased compared to most recent chest  radiograph and is definitely increased compared to most recent CT. Consolidation remains in the right upper lobe. Stable cardiac silhouette. Electronically Signed   By: Lowella Grip III M.D.   On: 04/16/2019 14:43   DG Chest Port 1 View  Result Date: 04/08/2019 CLINICAL DATA:  Post right-sided thoracentesis. EXAM: PORTABLE CHEST 1 VIEW COMPARISON:  04/07/2019; chest CT-04/07/2019 FINDINGS: Grossly unchanged cardiac silhouette and mediastinal contours with persistent obscuration of the right heart border and mediastinal stripe secondary to worsening right upper lobe heterogeneous opacities and associated volume loss. Worsening right mid and bilateral lower lung interstitial thickening. Interval reduction/resolution of right-sided pleural effusion post thoracentesis. No pneumothorax. No acute osseous abnormalities. IMPRESSION: 1. Interval reduction/resolution of right-sided pleural effusion post thoracentesis. No pneumothorax. 2. Worsening right heterogeneous/consolidative opacities and associated volume loss. 3. Worsening right mid bilateral lower lung interstitial thickening with broad differential considerations including pulmonary edema versus progression of multifocal infection, including atypical etiologies. Electronically Signed   By: Sandi Mariscal M.D.   On: 04/08/2019 17:31   ECHOCARDIOGRAM COMPLETE  Result Date: 04/08/2019    ECHOCARDIOGRAM REPORT   Patient Name:   HOLGER SOKOLOWSKI Date of Exam: 04/07/2019 Medical Rec #:  [id number]        Height:       65.0 in Accession #:    [id number]       Weight:       120.6 lb Date of Birth:  1955-10-26        BSA:          1.60 m Patient Age:    78 years         BP:           121/92 mmHg Patient Gender: M                HR:           96 bpm. Exam Location:  [facility] Procedure: 2D Echo, Cardiac Doppler and Color Doppler Indications:     Elevated Troponin  History:         Patient has no prior history of Echocardiogram examinations.                  Risk  Factors:Dyslipidemia and Hypertension. Stroke. Pneumonia.                  Myocardial Infarction. Dyspnea. Coronary artery disease. COPD.                  Chest pain. AAA.  Sonographer:  Wilford Sports Rodgers-Jones Referring Phys:  [place] Diagnosing Phys: Isaias Cowman MD IMPRESSIONS  1. Left ventricular ejection fraction, by estimation, is 35 to 40%. The left ventricle has moderately decreased function. The left ventrical has no regional wall motion abnormalities. The left ventricular internal cavity size was mildly to moderately dilated. Left ventricular diastolic parameters are indeterminate.  2. Right ventricular systolic function is normal. The right ventricular size is normal.  3. No left atrial/left atrial appendage thrombus was detected.  4. The mitral valve is normal in structure and function. Mild mitral valve regurgitation. No evidence of mitral stenosis.  5. The aortic valve is normal in structure and function. Aortic valve regurgitation is not visualized. Mild to moderate aortic valve stenosis.  6. The inferior vena cava is normal in size with greater than 50% respiratory variability, suggesting right atrial pressure of 3 mmHg. FINDINGS  Left Ventricle: Left ventricular ejection fraction, by estimation, is 35 to 40%. The left ventricle has moderately decreased function. The left ventricle has no regional wall motion abnormalities. The left ventricular internal cavity size was mildly to moderately dilated. There is no. There is no left ventricular hypertrophy. Left ventricular diastolic parameters are indeterminate.  LV Wall Scoring: The basal inferolateral segment, mid inferoseptal segment, apical septal segment, basal inferior segment, and apex are hypokinetic. Right Ventricle: The right ventricular size is normal. No increase in right ventricular wall thickness. Right ventricular systolic function is normal. Left Atrium: Left atrial size was normal in size. Right Atrium: Right  atrial size was normal in size. Pericardium: There is no evidence of pericardial effusion. Mitral Valve: The mitral valve is normal in structure and function. Normal mobility of the mitral valve leaflets. Mild mitral valve regurgitation. No evidence of mitral valve stenosis. Tricuspid Valve: The tricuspid valve is normal in structure. Tricuspid valve regurgitation is mild . No evidence of tricuspid stenosis. Aortic Valve: The aortic valve is normal in structure and function. Aortic valve regurgitation is not visualized. Mild to moderate aortic stenosis is present. Aortic valve mean gradient measures 6.7 mmHg. Aortic valve peak gradient measures 9.2 mmHg. Aortic valve area, by VTI measures 0.96 cm. Pulmonic Valve: The pulmonic valve was normal in structure. Pulmonic valve regurgitation is not visualized. No evidence of pulmonic stenosis. Aorta: The aortic root is normal in size and structure. Venous: The inferior vena cava is normal in size with greater than 50% respiratory variability, suggesting right atrial pressure of 3 mmHg. The inferior vena cava and the hepatic vein show a normal flow pattern. IAS/Shunts: No atrial level shunt detected by color flow Doppler.  LEFT VENTRICLE PLAX 2D LVIDd:         5.61 cm  Diastology LVIDs:         4.21 cm  LV e' lateral:   4.35 cm/s LV PW:         0.94 cm  LV E/e' lateral: 13.5 LV IVS:        0.68 cm  LV e' medial:    5.00 cm/s LVOT diam:     2.20 cm  LV E/e' medial:  11.7 LV SV:         23.64 ml LV SV Index:   47.63 LVOT Area:     3.80 cm  RIGHT VENTRICLE RV Basal diam:  3.68 cm RV S prime:     10.40 cm/s TAPSE (M-mode): 1.8 cm LEFT ATRIUM           Index       RIGHT ATRIUM  Index LA diam:      4.20 cm 2.63 cm/m  RA Area:     13.50 cm LA Vol (A2C): 40.0 ml 25.07 ml/m RA Volume:   39.70 ml  24.88 ml/m LA Vol (A4C): 17.9 ml 11.22 ml/m  AORTIC VALVE AV Area (Vmax):    0.94 cm AV Area (Vmean):   0.87 cm AV Area (VTI):     0.96 cm AV Vmax:           152.00 cm/s  AV Vmean:          122.333 cm/s AV VTI:            0.246 m AV Peak Grad:      9.2 mmHg AV Mean Grad:      6.7 mmHg LVOT Vmax:         37.45 cm/s LVOT Vmean:        27.900 cm/s LVOT VTI:          0.062 m LVOT/AV VTI ratio: 0.25  AORTA Ao Root diam: 3.60 cm MITRAL VALVE MV Area (PHT): 7.66 cm             SHUNTS MV Decel Time: 99 msec              Systemic VTI:  0.06 m MV E velocity: 58.70 cm/s 103 cm/s  Systemic Diam: 2.20 cm MV A velocity: 52.20 cm/s 70.3 cm/s MV E/A ratio:  1.12       1.5 Isaias Cowman MD Electronically signed by Isaias Cowman MD Signature Date/Time: 04/08/2019/1:00:24 PM    Final    US THORACENTESIS ASP PLEURAL SPACE W/IMG GUIDE  Result Date: 04/17/2019 INDICATION: Patient with history of COPD, shortness of breath, right-sided pleural effusion. Request is made for diagnostic and therapeutic right thoracentesis. EXAM: ULTRASOUND GUIDED RIGHT THORACENTESIS MEDICATIONS: 10 mL 2% nesacaine Patient with documented allergy to lidocaine, recent tolerance of nesacaine injection 9/51/88 COMPLICATIONS: None immediate. PROCEDURE: An ultrasound guided thoracentesis was thoroughly discussed with the patient and questions answered. The benefits, risks, alternatives and complications were also discussed. The patient understands and wishes to proceed with the procedure. Written consent was obtained. Ultrasound was performed to localize and mark an adequate pocket of fluid in the right chest. The area was then prepped and draped in the normal sterile fashion. 1% Lidocaine was used for local anesthesia. Under ultrasound guidance a 6 Fr Safe-T-Centesis catheter was introduced. Thoracentesis was performed. The catheter was removed and a dressing applied. FINDINGS: A total of approximately 700 mL of yellow, clear fluid was removed. Samples were sent to the laboratory as requested by the clinical team. IMPRESSION: Successful ultrasound guided right thoracentesis yielding 700 mL of pleural fluid. Read by:  Brynda Greathouse PA-C Electronically Signed   By: Aletta Edouard M.D.   On: 04/17/2019 11:26   US THORACENTESIS ASP PLEURAL SPACE W/IMG GUIDE  Addendum Date: 04/08/2019   ADDENDUM REPORT: 04/08/2019 17:38 ADDENDUM: Note, Nesacaine was utilized for subcutaneous anesthesia purposes given history of lidocaine allergy. Patient tolerated this medication without incident. Electronically Signed   By: Sandi Mariscal M.D.   On: 04/08/2019 17:38   Result Date: 04/08/2019 INDICATION: Symptomatic right-sided sided pleural effusion EXAM: US THORACENTESIS ASP PLEURAL SPACE W/IMG GUIDE COMPARISON:  Chest radiograph-04/07/2019; chest CT-04/06/2018 MEDICATIONS: None. COMPLICATIONS: None immediate. TECHNIQUE: Informed written consent was obtained from the patient after a discussion of the risks, benefits and alternatives to treatment. A timeout was performed prior to the initiation of the procedure. Initial ultrasound scanning demonstrates  a small anechoic right-sided pleural effusion. The lower chest was prepped and draped in the usual sterile fashion. 1% lidocaine was used for local anesthesia. An ultrasound image was saved for documentation purposes. An 8 Fr Safe-T-Centesis catheter was introduced under ultrasound guidance. The thoracentesis was performed. The catheter was removed and a dressing was applied. The patient tolerated the procedure well without immediate post procedural complication. The patient was escorted to have an upright chest radiograph. FINDINGS: A total of approximately 450 cc of serous fluid was removed. Requested samples were sent to the laboratory. IMPRESSION: Successful ultrasound-guided right sided thoracentesis yielding 450 cc of pleural fluid. Electronically Signed: By: Sandi Mariscal M.D. On: 04/08/2019 17:33      ASSESSMENT/PLAN   Moderate Acute exacerbation of COPD  -Continue DuoNebs every 6 hours, we will add MetaNeb with hypertonic saline today to allow patient to expectorate thickened  phlegm in preparation for discharge home.  -COPD care path -Budesonide 0.5 mg-we will hold her this for now -Continue prednisone as prescribed -Aggressive bronchopulmonary hygiene protocol-discussed with Amy RT today -MetaNeb therapy every 6 with duo nebs and hypertonic saline with respiratory therapist -Incentive spirometer and Acapella flutter valve -Physical and Occupational Therapy -I will stop 3 out 4 of his narcotics and also stop tessalon to help him cough otherwise he may continue get plugged up and atelectatic.  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 -Patient has been started on Zithromax, rifampin, ethambutol -Appreciate infectious disease evaluation and recommendations -Have set up patient for afflo vest therapy to be used twice daily 20 to 30-minute sessions -Care management consultation for home PT -Pulmonary rehab on outpatient basis    Moderate right pleural effusion   -Contributing to hypoxemia -ultrasound-guided thoracentesis -Pleural fluid studies with ADA, AFB, pH, glucose, cytology, LDH, albumin, protein triglycerides,    Moderate protein calorie malnutrition -Bitemporal wasting -Albumin 2.5 -Overall poor prognosis-will obtain palliative care consultation due to severe chronic lung disease with recurrent admissions for acute on chronic hypoxemia due to severe end-stage COPD with bullous emphysema, bronchiectasis, Mycobacterium avium complex     Thank you for allowing me to participate in the care of this patient.   Patient/Family are satisfied with care plan and all questions have been answered.  This document was prepared using Dragon voice recognition software and may include unintentional dictation errors.     FOttie Glazier M.D.  Division of [facility]

## 2019-04-20 NOTE — Progress Notes (Signed)
ID  Says he feels sob on minimal exertion He walked with PT and pulse ox dropped to 84% Today he ate better No vomiting or diarrhea  Patient Vitals for the past 24 hrs:  BP Temp Temp src Pulse Resp SpO2  04/20/19 0914 -- -- -- -- -- 95 %  04/20/19 0856 (!) 134/95 97.7 F (36.5 C) -- 97 20 95 %  04/20/19 0008 105/76 97.6 F (36.4 C) Oral 86 16 94 %  04/19/19 2005 -- -- -- -- -- 98 %  04/19/19 1528 132/90 98.1 F (36.7 C) Oral 88 -- 98 %   O/e Awake and alert Chest b/l air entry-  HSs1s2 Abd soft   Labs CBC Latest Ref Rng & Units 04/20/2019 04/19/2019 04/18/2019  WBC 4.0 - 10.5 K/uL 10.0 10.5 12.3(H)  Hemoglobin 13.0 - 17.0 g/dL 11.9(L) 11.9(L) 11.3(L)  Hematocrit 39.0 - 52.0 % 36.4(L) 36.8(L) 34.2(L)  Platelets 150 - 400 K/uL 204 199 190    CMP Latest Ref Rng & Units 04/19/2019 04/18/2019 04/17/2019  Glucose 70 - 99 mg/dL 93 115(H) 138(H)  BUN 8 - 23 mg/dL 25(H) 29(H) 16  Creatinine 0.61 - 1.24 mg/dL 1.00 1.21 1.04  Sodium 135 - 145 mmol/L 135 134(L) 133(L)  Potassium 3.5 - 5.1 mmol/L 4.6 4.9 4.8  Chloride 98 - 111 mmol/L 101 102 103  CO2 22 - 32 mmol/L 24 23 20(L)  Calcium 8.9 - 10.3 mg/dL 8.2(L) 8.3(L) 8.2(L)  Total Protein 6.5 - 8.1 g/dL - - 5.9(L)  Total Bilirubin 0.3 - 1.2 mg/dL - - 0.7  Alkaline Phos 38 - 126 U/L - - 75  AST 15 - 41 U/L - - 14(L)  ALT 0 - 44 U/L - - 26    Impression/recomendation  Cavitary MAC -rt lung- on azithromycin, rifampin and ethambutol- Do not give all the antibiotics together as he has severe Gi symptoms- the past few days it has been spaced out and he is tolerating it better- Will do the same on discharge  Acute hypoxic respiratory failure- COPD exacerbation+ AMc Management as per pulmonologist Would benefit from pulmonary rehab  Discussed the management with care team

## 2019-04-20 NOTE — Progress Notes (Signed)
Physical Therapy Treatment Patient Details Name: Joseph Peters MRN: [id number] DOB: Jul 06, 1955 Today's Date: 04/20/2019    History of Present Illness Joseph Peters is a 33yoM who comes to [facility] on 04/16/19. 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 PMH: CAD, COPD, hypertension, AAA, pulmonary fibrosis. Chest  CT negative for PE.    PT Comments    Ready to walk this AM.  95% on 3 lpm.  Bed mobility without assist.  He is able to stand and walk 50' on 3 lpm before needing a seated rest due to coughing.  Sats 89% but increased to low 90's after short rest.  Stood and was able to complete the rest of nursing lap with RW and supervision/min guard.  Sats do decrease to 79 with gait and increased to 6 lpm for 50' to get back to his room.  Pt declined offer to sit and rest.  Upon sitting, sats briefly 78 then increased to low 90's steadily on 3 lpm.  Discussed energy conservation as pt would pick up walker at times instead of just pushing it.  He stated he planned to purchase a pulse ox for home monitoring.  Encouraged pt to watch sats and monitor breathing and SOB and rest as needed at home.  He is generally steady on his feet but limited by SOB and sats.   Follow Up Recommendations  Home health PT     Equipment Recommendations  None recommended by PT    Recommendations for Other Services       Precautions / Restrictions Precautions Precautions: Fall Restrictions Weight Bearing Restrictions: No    Mobility  Bed Mobility Overal bed mobility: Independent                Transfers Overall transfer level: Modified independent   Transfers: Sit to/from Stand Sit to Stand: Supervision;Modified independent (Device/Increase time)            Ambulation/Gait Ambulation/Gait assistance: Supervision Gait Distance (Feet): 180 Feet Assistive device: Rolling walker (2 wheeled) Gait Pattern/deviations: Step-through pattern Gait velocity: decreased       Stairs              Wheelchair Mobility    Modified Rankin (Stroke Patients Only)       Balance Overall balance assessment: Mild deficits observed, not formally tested;Modified Independent                                          Cognition Arousal/Alertness: Awake/alert Behavior During Therapy: WFL for tasks assessed/performed;Anxious Overall Cognitive Status: Within Functional Limits for tasks assessed                                        Exercises Other Exercises Other Exercises: ankle pumps and SLR BLE x 10    General Comments        Pertinent Vitals/Pain Pain Assessment: No/denies pain    Home Living                      Prior Function            PT Goals (current goals can now be found in the care plan section) Progress towards PT goals: Progressing toward goals    Frequency    Min 2X/week  PT Plan Current plan remains appropriate    Co-evaluation              AM-PAC PT "6 Clicks" Mobility   Outcome Measure  Help needed turning from your back to your side while in a flat bed without using bedrails?: None Help needed moving from lying on your back to sitting on the side of a flat bed without using bedrails?: None Help needed moving to and from a bed to a chair (including a wheelchair)?: None Help needed standing up from a chair using your arms (e.g., wheelchair or bedside chair)?: None Help needed to walk in hospital room?: A Little Help needed climbing 3-5 steps with a railing? : A Little 6 Click Score: 22    End of Session Equipment Utilized During Treatment: Gait belt;Oxygen Activity Tolerance: Patient tolerated treatment well Patient left: in chair;with call bell/phone within reach;with chair alarm set Nurse Communication: Mobility status       Time: 2482-5003 PT Time Calculation (min) (ACUTE ONLY): 20 min  Charges:  $Gait Training: 8-22 mins                    Chesley Noon,  PTA 04/20/19, 9:16 AM

## 2019-04-20 NOTE — Progress Notes (Signed)
Bedside spirometry performed. Pt gave good effort. Results are as follows FEV1 1.28 43.8% of predicted FEV   2.80 71.8% of predicted FEV1/FVC 45.9 61% of predicted

## 2019-04-20 NOTE — Care Management Important Message (Signed)
Important Message  Patient Details  Name: Joseph Peters MRN: [id number] Date of Birth: 12-Feb-1956   Medicare Important Message Given:  Yes     Dannette Barbara 04/20/2019, 11:15 AM

## 2019-04-21 ENCOUNTER — Inpatient Hospital Stay: Payer: Medicare Other

## 2019-04-21 DIAGNOSIS — R0600 Dyspnea, unspecified: Secondary | ICD-10-CM

## 2019-04-21 DIAGNOSIS — R0602 Shortness of breath: Secondary | ICD-10-CM

## 2019-04-21 LAB — CBC
HCT: 37.1 % — ABNORMAL LOW (ref 39.0–52.0)
Hemoglobin: 12.2 g/dL — ABNORMAL LOW (ref 13.0–17.0)
MCH: 29.4 pg (ref 26.0–34.0)
MCHC: 32.9 g/dL (ref 30.0–36.0)
MCV: 89.4 fL (ref 80.0–100.0)
Platelets: 218 10*3/uL (ref 150–400)
RBC: 4.15 MIL/uL — ABNORMAL LOW (ref 4.22–5.81)
RDW: 14.6 % (ref 11.5–15.5)
WBC: 9.8 10*3/uL (ref 4.0–10.5)
nRBC: 0 % (ref 0.0–0.2)

## 2019-04-21 LAB — BASIC METABOLIC PANEL
Anion gap: 8 (ref 5–15)
BUN: 17 mg/dL (ref 8–23)
CO2: 27 mmol/L (ref 22–32)
Calcium: 8.3 mg/dL — ABNORMAL LOW (ref 8.9–10.3)
Chloride: 102 mmol/L (ref 98–111)
Creatinine, Ser: 0.9 mg/dL (ref 0.61–1.24)
GFR calc Af Amer: >60 mL/min (ref >60)
GFR calc non Af Amer: >60 mL/min (ref >60)
Glucose, Bld: 79 mg/dL (ref 70–99)
Potassium: 4.2 mmol/L (ref 3.5–5.1)
Sodium: 137 mmol/L (ref 135–145)

## 2019-04-21 LAB — CULTURE, BLOOD (ROUTINE X 2)
Culture: NO GROWTH
Culture: NO GROWTH
Special Requests: ADEQUATE
Special Requests: ADEQUATE

## 2019-04-21 MED ORDER — MIRTAZAPINE 15 MG PO TABS
7.5000 mg | ORAL_TABLET | Freq: Every day | ORAL | Status: DC
  Administered 2019-04-21 – 2019-04-22 (×2): 7.5 mg via ORAL
  Filled 2019-04-21 (×2): qty 1

## 2019-04-21 MED ORDER — ACETYLCYSTEINE 20 % IN SOLN
4.0000 mL | Freq: Two times a day (BID) | RESPIRATORY_TRACT | Status: DC
  Administered 2019-04-21 – 2019-04-23 (×3): 4 mL via RESPIRATORY_TRACT
  Filled 2019-04-21 (×5): qty 4

## 2019-04-21 MED ORDER — FUROSEMIDE 10 MG/ML IJ SOLN
20.0000 mg | Freq: Once | INTRAMUSCULAR | Status: AC
  Administered 2019-04-21: 20 mg via INTRAVENOUS
  Filled 2019-04-21: qty 4

## 2019-04-21 MED ORDER — MORPHINE SULFATE (CONCENTRATE) 10 MG/0.5ML PO SOLN
10.0000 mg | ORAL | Status: DC | PRN
  Administered 2019-04-21 – 2019-04-22 (×2): 10 mg via ORAL
  Filled 2019-04-21 (×2): qty 0.5

## 2019-04-21 NOTE — Progress Notes (Signed)
[place] visited pt. as follow up from visit on Thurs (2/18); pt. in bed when [facility] arrived.  'I'm not doing too well' pt. shared when [facility] greeted him.  Pt. explained doctors have told him he has pneumonia in both lungs; pt. expressed a sense that he was not getting better --> felt worse than when he first arrived @ [facility].  Pt.'s family appears to be significant source of support; pt.'s brother called to check on him while [facility] was in rm.  Pt. lives w/his wife and a caretaker; wife has been having problems with her memory for the last two years that are getting progressively worse, pt. shared.  Pt. is utilizing his faith in coping, remembering prior situations where God has 'brought me into the light'.  Pt. requested prayer @ end of [facility] visit.  No further needs expressed.  [place] will attempt to follow up later this week.      04/21/19 1530  Clinical Encounter Type  Visited With Patient;Health care provider  Visit Type Follow-up;Psychological support;Spiritual support;Social support  Consult/Referral To Chaplain  Spiritual Encounters  Spiritual Needs Prayer;Emotional  Stress Factors  Patient Stress Factors Exhausted;Health changes;Loss of control;Major life changes;Lack of knowledge

## 2019-04-21 NOTE — Plan of Care (Signed)

## 2019-04-21 NOTE — Progress Notes (Signed)
Pt continues to exhibit signs of Hypoxia associated with Chronic Respiratory Failure secondary to Severe COPD. Patient requires the use of NIV both QHS and daytime to help with exacerbation periods. The use of the NIV will treat patient's Low PFT  Levels and can reduce risk of exacerbations and future hospitalizations when used at night and during the day. Pt will need these advanced settings in conjunction with her current medication regimen; BIPAP is not an option due to its functional limitations and the severity of the patient's condition. Failure to have NIV available for use over a 24- hour period could lead to death. Patient is able to clear and maintain their own airway.

## 2019-04-21 NOTE — Progress Notes (Signed)
Daily Progress Note   Patient Name: Joseph Peters       Date: 04/21/2019 DOB: [birth year]  Age: 64 y.o. MRN#: [id number] Attending Physician: Max Sane, MD Primary Care Physician: Barbaraann Boys, MD Admit Date: 04/16/2019  Reason for Consultation/Follow-up: Establishing goals of care  Subjective: Patient in bed. Shares his frustration at his poor health status and the fact that he feels he is "going down". Discussed with patient that he is very sick with an infection that is very difficult to recover from and that some people unfortunately eventually die from or have recurrent infections and debility.  Joseph Peters stated he just wanted to feel better. He complains of discomfort in his chest, coughing, SOB, lack of appetite, and some anxiety and depression. We discussed trial of symptom management using liquid morphine for SOB and anxiety, and low dose mirtazapine for appetite and depressive symptoms.   ROS  Length of Stay: 5  Current Medications: Scheduled Meds:  . azithromycin  500 mg Oral Q0600  . Chlorhexidine Gluconate Cloth  6 each Topical Daily  . chloroprocaine  10 mL Infiltration Once  . enoxaparin (LOVENOX) injection  40 mg Subcutaneous Q24H  . ethambutol  400 mg Oral BID  . feeding supplement (ENSURE ENLIVE)  237 mL Oral TID BM  . finasteride  5 mg Oral Daily  . FLUoxetine  20 mg Oral Daily  . furosemide  20 mg Intravenous Once  . gabapentin  900 mg Oral QHS  . ipratropium-albuterol  3 mL Nebulization Q6H  . losartan  25 mg Oral Daily  . mouth rinse  15 mL Mouth Rinse BID  . metoprolol succinate  25 mg Oral Daily  . mirtazapine  7.5 mg Oral QHS  . montelukast  10 mg Oral QHS  . multivitamin with minerals  1 tablet Oral Daily  . predniSONE  40 mg Oral Q breakfast  .  rifampin  300 mg Oral BID  . roflumilast  250 mcg Oral Daily  . sodium chloride HYPERTONIC  3 mL Nebulization Q6H WA  . tamsulosin  0.4 mg Oral QPC supper    Continuous Infusions: . sodium chloride Stopped (04/16/19 1450)    PRN Meds: ipratropium-albuterol, morphine CONCENTRATE, ondansetron  Physical Exam Vitals and nursing note reviewed.  Constitutional:      Appearance: He is ill-appearing.  Comments: frail  Pulmonary:     Effort: Pulmonary effort is normal.  Neurological:     General: No focal deficit present.     Mental Status: He is alert.  Psychiatric:        Mood and Affect: Mood normal.        Thought Content: Thought content normal.             Vital Signs: BP 126/79 (BP Location: Right Arm)   Pulse 84   Temp 97.8 F (36.6 C) (Oral)   Resp 17   Ht _0  (1.651 m)   Wt 53.3 kg   SpO2 93%   BMI 19.55 kg/m  SpO2: SpO2: 93 % O2 Device: O2 Device: Nasal Cannula O2 Flow Rate: O2 Flow Rate (L/min): 3 L/min  Intake/output summary:   Intake/Output Summary (Last 24 hours) at 04/21/2019 1443 Last data filed at 04/21/2019 2426 Gross per 24 hour  Intake 435 ml  Output --  Net 435 ml   LBM: Last BM Date: 04/20/19 Baseline Weight: Weight: 54.3 kg Most recent weight: Weight: 53.3 kg       Palliative Assessment/Data: PPS: 50%    Flowsheet Rows     Most Recent Value  Intake Tab  Referral Department  Pulmonary  Unit at Time of Referral  Med/Surg Unit  Palliative Care Primary Diagnosis  Pulmonary  Date Notified  04/20/19  Palliative Care Type  New Palliative care  Reason for referral  Clarify Goals of Care  Date of Admission  04/16/19  Date first seen by Palliative Care  04/20/19  # of days Palliative referral response time  0 Day(s)  # of days IP prior to Palliative referral  4  Clinical Assessment  Psychosocial & Spiritual Assessment  Palliative Care Outcomes      Patient Active Problem List   Diagnosis Date Noted  . Acute on chronic  respiratory failure with hypoxia ([place])   . Goals of care, counseling/discussion   . Advanced care planning/counseling discussion   . Palliative care by specialist   . Restless legs syndrome (RLS) 04/01/2019  . Protein-calorie malnutrition, severe 10/15/2018  . Nodule of upper lobe of right lung 10/15/2018  . RLQ abdominal pain 10/14/2018  . Status post total hip replacement, right 07/01/2018  . Strain of right knee 06/26/2018  . Lumbar strain 06/16/2018  . Strain of right hip 06/16/2018  . Hyperlipidemia 01/30/2018  . Rotator cuff tendinitis, right 08/19/2017  . Traumatic complete tear of right rotator cuff 08/19/2017  . Ascending aortic aneurysm ([place]) 08/01/2017  . Tremor, essential 05/28/2017  . Gastroesophageal reflux disease without esophagitis 12/25/2016  . Renal artery stenosis ([place]) 12/05/2015  . 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 Syncope and collapse 12/05/2015  . Essential hypertension 10/15/2015  . COPD exacerbation ([place]) 01/31/2015  . Acute respiratory failure ([place]) 01/31/2015  . Drug allergy 01/30/2015  . AAA (abdominal aortic aneurysm) ([place]) 11/23/2014  . Prostate cancer screening 11/02/2014  . Gross hematuria 11/02/2014  . Benign hypertension 07/16/2014  . 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 Occlusion and stenosis of unspecified carotid artery 05/14/2012  . Coccydynia 02/08/2012  . 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 Gait disorder 02/08/2012  . Spinal stenosis of lumbar region 01/13/2009  . Atherosclerotic heart disease of native coronary artery without angina pectoris 06/28/2008  . Sciatica 06/28/2008  . Recurrent major depressive disorder in partial remission ([place]) 06/28/2008    Palliative Care Assessment &  Plan   Patient Profile: 64 y.o. male  with past medical history of severe COPD, recent admission for MAC pnuemonia, alcoholism, anxiety, chronic pain, IBS admitted on 04/16/2019  with increasing shortness of breath. 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  Assessment/Recommendations/Plan   Continue current level of care  Liquid morphine concentrate 24m q2hr prn anxiety, sob, or pain- discussed with patient will d/c percocet and use morphine instead  Mirtazapine 7.530mqhs for depression, anxiety, and appetite stimulant  Goals of Care and Additional Recommendations:  Limitations on Scope of Treatment: Full Scope Treatment  Code Status:  Full code  Prognosis:   Unable to determine  Discharge Planning:  Home with Palliative Services  Care plan was discussed with patient.   Thank you for allowing the Palliative Medicine Team to assist in the care of this patient.   Time In: 1400 Time Out: 1445 Total Time 45 mins Prolonged Time Billed no      Greater than 50%  of this time was spent counseling and coordinating care related to the above assessment and plan.  KaMariana KaufmanAGNP-C Palliative Medicine   Please contact Palliative Medicine Team phone at [phone] questions and concerns.

## 2019-04-21 NOTE — Progress Notes (Signed)
[facility] NAME: Joseph Peters    MR#:  [id number]  DATE OF BIRTH:  10-30-1955  SUBJECTIVE:  CHIEF COMPLAINT:   Chief Complaint  Patient presents with  . Shortness of Breath  some better than y'day. Short of breath & hypoxic -with minimal exertion, cough + REVIEW OF SYSTEMS:  Review of Systems  Constitutional: Negative for diaphoresis, fever, malaise/fatigue and weight loss.  HENT: Negative for ear discharge, ear pain, hearing loss, nosebleeds, sore throat and tinnitus.   Eyes: Negative for blurred vision and pain.  Respiratory: Positive for cough and shortness of breath. Negative for hemoptysis and wheezing.   Cardiovascular: Negative for chest pain, palpitations, orthopnea and leg swelling.  Gastrointestinal: Negative for abdominal pain, blood in stool, constipation, diarrhea, heartburn, nausea and vomiting.  Genitourinary: Negative for dysuria, frequency and urgency.  Musculoskeletal: Negative for back pain and myalgias.  Skin: Negative for itching and rash.  Neurological: Negative for dizziness, tingling, tremors, focal weakness, seizures, weakness and headaches.  Psychiatric/Behavioral: Negative for depression. The patient is not nervous/anxious.    DRUG ALLERGIES:   Allergies  Allergen Reactions  . Penicillins Swelling, Rash and Other (See Comments)    Swelling in throat  Did it involve swelling of the face/tongue/throat, SOB, or low BP? yes Did it involve sudden or severe rash/hives, skin peeling, or any reaction on the inside of your mouth or nose? no Did you need to seek medical attention at a hospital or doctor's office? Yes When did it last happen?years  If all above answers are "NO", may proceed with cephalosporin use.    . Lidocaine Other (See Comments)    Unsure  At dentist's office   VITALS:  Blood pressure 126/79, pulse 84, temperature 97.8 F (36.6 C), temperature source Oral, resp. rate 17, height _0  (1.651  m), weight 53.3 kg, SpO2 93 %. PHYSICAL EXAMINATION:  Physical Exam Constitutional:      Appearance: He is underweight.  HENT:     Head: Normocephalic and atraumatic.  Eyes:     Conjunctiva/sclera: Conjunctivae normal.     Pupils: Pupils are equal, round, and reactive to light.  Neck:     Thyroid: No thyromegaly.     Trachea: No tracheal deviation.  Cardiovascular:     Rate and Rhythm: Normal rate and regular rhythm.     Heart sounds: Murmur present. Systolic murmur present.  Pulmonary:     Effort: Pulmonary effort is normal. No respiratory distress.     Breath sounds: Examination of the right-upper field reveals rhonchi. Examination of the left-upper field reveals rhonchi. Examination of the right-lower field reveals decreased breath sounds. Examination of the left-lower field reveals decreased breath sounds. Decreased breath sounds and rhonchi present. No wheezing.  Chest:     Chest wall: No tenderness.  Abdominal:     General: Bowel sounds are normal. There is no distension.     Palpations: Abdomen is soft.     Tenderness: There is no abdominal tenderness.  Musculoskeletal:        General: Normal range of motion.     Cervical back: Normal range of motion and neck supple.  Skin:    General: Skin is warm and dry.     Findings: No rash.  Neurological:     Mental Status: He is alert and oriented to person, place, and time.     Cranial Nerves: No cranial nerve deficit.    LABORATORY PANEL:  Male CBC Recent Labs  Lab 04/21/19 0430  WBC 9.8  HGB 12.2*  HCT 37.1*  PLT 218   ------------------------------------------------------------------------------------------------------------------ Chemistries  Recent Labs  Lab 04/17/19 0519 04/18/19 0450 04/21/19 0430  NA 133*   < > 137  K 4.8   < > 4.2  CL 103   < > 102  CO2 20*   < > 27  GLUCOSE 138*   < > 79  BUN 16   < > 17  CREATININE 1.04   < > 0.90  CALCIUM 8.2*   < > 8.3*  MG 2.4  --   --   AST 14*  --   --     ALT 26  --   --   ALKPHOS 75  --   --   BILITOT 0.7  --   --    < > = values in this interval not displayed.   RADIOLOGY:  DG Chest Right Decubitus  Result Date: 04/21/2019 CLINICAL DATA:  Dyspnea, recent right-sided thoracentesis EXAM: CHEST - RIGHT DECUBITUS; CHEST - LEFT DECUBITUS COMPARISON:  CT 04/16/2019 FINDINGS: Bilateral decubitus views of the chest were obtained. Small to moderate right-sided pleural effusion with small loculated component at the right upper lobe. Trace layering left-sided pleural effusion without loculated component. There is no pneumothorax. Chronic consolidation in the right upper lobe. Fibrotic changes throughout both lungs. Stable heart size. IMPRESSION: 1. Negative for pneumothorax. 2. Small to moderate right-sided pleural effusion with small loculated component at the right upper lobe. 3. Trace layering left-sided pleural effusion without loculated component. Electronically Signed   By: Davina Poke D.O.   On: 04/21/2019 11:17   DG Chest Left Decubitus  Result Date: 04/21/2019 CLINICAL DATA:  Dyspnea, recent right-sided thoracentesis EXAM: CHEST - RIGHT DECUBITUS; CHEST - LEFT DECUBITUS COMPARISON:  CT 04/16/2019 FINDINGS: Bilateral decubitus views of the chest were obtained. Small to moderate right-sided pleural effusion with small loculated component at the right upper lobe. Trace layering left-sided pleural effusion without loculated component. There is no pneumothorax. Chronic consolidation in the right upper lobe. Fibrotic changes throughout both lungs. Stable heart size. IMPRESSION: 1. Negative for pneumothorax. 2. Small to moderate right-sided pleural effusion with small loculated component at the right upper lobe. 3. Trace layering left-sided pleural effusion without loculated component. Electronically Signed   By: Davina Poke D.O.   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  -completed prednisone -continue Incentive spirometer and Acapella flutter valve -Stopped narcotics and Tessalon per pulmonary -Continue Daliresp 29mg daily  -TOC team working on to get him qualified for trilogy at home considering his multiple readmissions   * Cavitary MAC Rt lung - was recently admitted to hospital with increasing SOB on 04/07/19 and was discharge don 04/11/19 with home oxygen - doubt his compliance to MAC regimen - azithromycin/rifampin/ethambutol) -He has set him up for afflo vest therapy to be used twice daily 20 to 30-minute  sessions - ID following.  They recommend to space out the meds and give azithromycin with breakfast and rifampin-ethambutol spread over 12 hrs instead of all at one time  * B/l pleural effusion: Rt > Lt s/p right pleural thoracentesis on 04/08/19 & 2/19 -decubs today shows small-moderate rt sided pl. Effusion with small loculation in RUL, trace layering Lt pl. Effusion - will try lasix 20 mg IV once, he's + 1.6 liters  * HTN: continue losartan & metoprolol  * HLD: continue on statin  * Thoracic aortic aneurysm: CTA chest is neg for PE but shows and ascending thoracic aortic aneurysm 4.5 x 4.6 cmand also aneurysmal dilatation of descending thoracic aorta without evidence of dissection. Vascular surg previously knew about aneurysm unable to  repair until MAC infection is completely resolved as a high mortality rate w/ infection of graft as per vascular surg   Moderate protein calorie malnutrition  PT/OT eval - HHPT   DVT prophylaxis: Lovenox Family Communication:  NO "discussed with patient" Disposition Plan:  Came from Home D/C back home with home health and palliative care to follow Barriers to DC - await clinical improvement, pulmonary and ID still actively managing his pulmonary issues, gets dyspneic/hypoxic on minimal exertion   All the records are reviewed and case discussed with Care Management/Social Worker. Management plans discussed with the patient, nursing and they are in agreement.  CODE STATUS: Full Code  TOTAL TIME TAKING CARE OF THIS PATIENT: 35 minutes.   More than 50% of the time was spent in counseling/coordination of care: YES  POSSIBLE D/C IN 1-2 DAYS, DEPENDING ON CLINICAL CONDITION.   Max Sane M.D on 04/21/2019 at 1:19 PM  [facility]   CC: Primary care physician; Barbaraann Boys, MD  Note: This dictation was prepared with Dragon dictation along with smaller phrase technology. Any transcriptional errors that result from this process are unintentional.

## 2019-04-21 NOTE — Progress Notes (Signed)
Pulmonary Medicine          Date: 04/21/2019,   MRN# [id number] Joseph Peters 04/23/1955     AdmissionWeight: 54.3 kg                 CurrentWeight: 53.3 kg   Referring Physician: Dr Jimmye Norman   CHIEF COMPLAINT:   Advanced Emphysema with Mycobacterium Avium Complex    SUBJECTIVE   Patient laying in bed states he feels tired and frustrated.  He was seen by palliative today and morphine was initiated which helped with chest discomfort.   PAST MEDICAL HISTORY   Past Medical History:  Diagnosis Date  . AAA (abdominal aortic aneurysm) without rupture ([place])   . Abdominal aneurysm ([place]) 2015   being followed but not large enough to surgically treat  . Alcoholism ([place])   . Anxiety   . Arthritis   . Asthma   . Benign hypertension 07/16/2014  . Chest pain on exertion 07/16/2014  . Chronic pain syndrome   . Coccydynia 02/08/2012  . COPD (chronic obstructive pulmonary disease) ([place])    patient smokes  . Coronary artery disease   . 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 Depression   . 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 Dyspnea   . Frequent falls   . GERD (gastroesophageal reflux disease)   . Hyperlipidemia   . IBS (irritable bowel syndrome)   . Myocardial infarction ([place]) 2010  . Neuropathy   . Pneumonia   . Prostatitis   . Restless leg syndrome   . Sciatica   . Spinal stenosis of lumbar region   . Stroke ([place]) 2015   no residual symptoms  . Tremor, essential      SURGICAL HISTORY   Past Surgical History:  Procedure Laterality Date  . CARDIAC CATHETERIZATION  2012  . COLONOSCOPY WITH PROPOFOL N/A 11/14/2016   Procedure: COLONOSCOPY WITH PROPOFOL;  Surgeon: Manya Silvas, MD;  Location: [facility] ENDOSCOPY;  Service: Endoscopy;  Laterality: N/A;  . ESOPHAGOGASTRODUODENOSCOPY (EGD) WITH PROPOFOL N/A 11/14/2016   Procedure: ESOPHAGOGASTRODUODENOSCOPY (EGD) WITH PROPOFOL;  Surgeon: Manya Silvas, MD;  Location:  [facility] ENDOSCOPY;  Service: Endoscopy;  Laterality: N/A;  . JOINT REPLACEMENT Right    hip  . KNEE SURGERY     right  . SHOULDER ARTHROSCOPY WITH OPEN ROTATOR CUFF REPAIR Right 08/20/2017   Procedure: SHOULDER ARTHROSCOPY WITH OPEN ROTATOR CUFF REPAIR;  Surgeon: Corky Mull, MD;  Location: [facility] ORS;  Service: Orthopedics;  Laterality: Right;  . SHOULDER ARTHROSCOPY WITH SUBACROMIAL DECOMPRESSION, ROTATOR CUFF REPAIR AND BICEP TENDON REPAIR Left 05/10/2016   Procedure: SHOULDER ARTHROSCOPY WITH DEBTRIDEMENT, DECOMPRESSION, REPAIR OF MASSIVE ROTATOR CUFF TEAR AND BICEP TENODESIS;  Surgeon: Corky Mull, MD;  Location: [facility] ORS;  Service: Orthopedics;  Laterality: Left;  Massive cuff tear  . TOTAL HIP ARTHROPLASTY Right 07/01/2018   Procedure: TOTAL HIP ARTHROPLASTY - RIGHT;  Surgeon: Corky Mull, MD;  Location: [facility] ORS;  Service: Orthopedics;  Laterality: Right;  Marland Kitchen VIDEO BRONCHOSCOPY WITH ENDOBRONCHIAL NAVIGATION N/A 02/13/2019   Procedure: VIDEO BRONCHOSCOPY WITH ENDOBRONCHIAL NAVIGATION;  Surgeon: Ottie Glazier, MD;  Location: [facility] ORS;  Service: Thoracic;  Laterality: N/A;  . VIDEO BRONCHOSCOPY WITH ENDOBRONCHIAL NAVIGATION N/A 03/13/2019   Procedure: VIDEO BRONCHOSCOPY WITH ENDOBRONCHIAL NAVIGATION;  Surgeon: Ottie Glazier, MD;  Location: [facility] ORS;  Service: Thoracic;  Laterality: N/A;  . VIDEO BRONCHOSCOPY WITH ENDOBRONCHIAL ULTRASOUND N/A 02/13/2019   Procedure: VIDEO BRONCHOSCOPY WITH  ENDOBRONCHIAL ULTRASOUND;  Surgeon: Ottie Glazier, MD;  Location: [facility] ORS;  Service: Thoracic;  Laterality: N/A;  . VIDEO BRONCHOSCOPY WITH ENDOBRONCHIAL ULTRASOUND N/A 03/13/2019   Procedure: VIDEO BRONCHOSCOPY WITH ENDOBRONCHIAL ULTRASOUND;  Surgeon: Ottie Glazier, MD;  Location: [facility] ORS;  Service: Thoracic;  Laterality: N/A;     FAMILY HISTORY   Family History  Problem Relation Age of Onset  . Cancer Mother   . COPD Father 52       black lung     SOCIAL HISTORY   Social History    Tobacco Use  . Smoking status: Light Tobacco Smoker    Packs/day: 0.50    Years: 30.00    Pack years: 15.00    Types: Cigarettes    Start date: 02/26/1973    Last attempt to quit: 09/21/2018    Years since quitting: 0.5  . Smokeless tobacco: Former Systems developer    Quit date: 02/26/1998  . Tobacco comment: smokes 2-3 cig a week 02/12/19  Substance Use Topics  . Alcohol use: Yes    Alcohol/week: 20.0 standard drinks    Types: 20 Cans of beer per week    Comment: week   . Drug use: Yes    Comment: prescribed hydrocodone     MEDICATIONS    Home Medication:    Current Medication:  Current Facility-Administered Medications:  .  azithromycin (ZITHROMAX) tablet 500 mg, 500 mg, Oral, Q0600, Tsosie Billing, MD, 500 mg at 04/21/19 0458 .  Chlorhexidine Gluconate Cloth 2 % PADS 6 each, 6 each, Topical, Daily, Max Sane, MD, 6 each at 04/21/19 [phone] .  chloroprocaine (NESACAINE-MPF) 2 % injection 10 mL, 10 mL, Infiltration, Once, Matthews, Herma Carson, PA .  enoxaparin (LOVENOX) injection 40 mg, 40 mg, Subcutaneous, Q24H, Blakeney, Dana G, NP, 40 mg at 04/21/19 1718 .  ethambutol (MYAMBUTOL) tablet 400 mg, 400 mg, Oral, BID, Tsosie Billing, MD, 400 mg at 04/21/19 0850 .  feeding supplement (ENSURE ENLIVE) (ENSURE ENLIVE) liquid 237 mL, 237 mL, Oral, TID BM, Manuella Ghazi, Vipul, MD, 237 mL at 04/21/19 1710 .  finasteride (PROSCAR) tablet 5 mg, 5 mg, Oral, Daily, Max Sane, MD, 5 mg at 04/21/19 0849 .  FLUoxetine (PROZAC) capsule 20 mg, 20 mg, Oral, Daily, Max Sane, MD, 20 mg at 04/21/19 0850 .  gabapentin (NEURONTIN) capsule 900 mg, 900 mg, Oral, QHS, Max Sane, MD, 900 mg at 04/20/19 2115 .  ipratropium-albuterol (DUONEB) 0.5-2.5 (3) MG/3ML nebulizer solution 3 mL, 3 mL, Nebulization, Q6H, Blakeney, Dana G, NP, 3 mL at 04/21/19 1313 .  ipratropium-albuterol (DUONEB) 0.5-2.5 (3) MG/3ML nebulizer solution 3 mL, 3 mL, Nebulization, Q6H PRN, Awilda Bill, NP .  losartan  (COZAAR) tablet 25 mg, 25 mg, Oral, Daily, Max Sane, MD, 25 mg at 04/21/19 0851 .  MEDLINE mouth rinse, 15 mL, Mouth Rinse, BID, Awilda Bill, NP, 15 mL at 04/21/19 0851 .  metoprolol succinate (TOPROL-XL) 24 hr tablet 25 mg, 25 mg, Oral, Daily, Max Sane, MD, 25 mg at 04/21/19 0851 .  mirtazapine (REMERON) tablet 7.5 mg, 7.5 mg, Oral, QHS, Mahan, Kasie J, NP .  montelukast (SINGULAIR) tablet 10 mg, 10 mg, Oral, QHS, Manuella Ghazi, Vipul, MD, 10 mg at 04/20/19 2115 .  morphine CONCENTRATE 10 MG/0.5ML oral solution 10 mg, 10 mg, Oral, Q2H PRN, Earlie Counts, NP, 10 mg at 04/21/19 1711 .  multivitamin with minerals tablet 1 tablet, 1 tablet, Oral, Daily, Max Sane, MD, 1 tablet at 04/21/19 0848 .  ondansetron (ZOFRAN-ODT) disintegrating tablet 4  mg, 4 mg, Oral, Q8H PRN, Max Sane, MD, 4 mg at 04/17/19 1025 .  rifampin (RIFADIN) capsule 300 mg, 300 mg, Oral, BID, Ravishankar, Jayashree, MD, 300 mg at 04/21/19 0853 .  roflumilast (DALIRESP) tablet 250 mcg, 250 mcg, Oral, Daily, Max Sane, MD, 250 mcg at 04/21/19 0854 .  sodium chloride 0.9 % bolus 1,000 mL, 1,000 mL, Intravenous, Once, Duffy Bruce, MD, Stopped at 04/16/19 1450 .  sodium chloride HYPERTONIC 3 % nebulizer solution 3 mL, 3 mL, Nebulization, Q6H WA, Lanney Gins, Vin Yonke, MD, 3 mL at 04/21/19 0226 .  tamsulosin (FLOMAX) capsule 0.4 mg, 0.4 mg, Oral, QPC supper, Max Sane, MD, 0.4 mg at 04/21/19 1718    ALLERGIES   Penicillins and Lidocaine     REVIEW OF SYSTEMS    Review of Systems:  Gen:  Denies  fever, sweats, chills weigh loss  HEENT: Denies blurred vision, double vision, ear pain, eye pain, hearing loss, nose bleeds, sore throat Cardiac:  No dizziness, chest pain or heaviness, chest tightness,edema Resp:   Denies cough or sputum porduction, shortness of breath,wheezing, hemoptysis,  Gi: Denies swallowing difficulty, stomach pain, nausea or vomiting, diarrhea, constipation, bowel incontinence Gu:  Denies bladder  incontinence, burning urine Ext:   Denies Joint pain, stiffness or swelling Skin: Denies  skin rash, easy bruising or bleeding or hives Endoc:  Denies polyuria, polydipsia , polyphagia or weight change Psych:   Denies depression, insomnia or hallucinations   Other:  All other systems negative   VS: BP 104/82 (BP Location: Right Arm)   Pulse 75   Temp 98.3 F (36.8 C)   Resp 18   Ht _0  (1.651 m)   Wt 53.3 kg   SpO2 95%   BMI 19.55 kg/m      PHYSICAL EXAM    GENERAL:NAD, no fevers, chills, no weakness no fatigue HEAD: Normocephalic, atraumatic.  EYES: Pupils equal, round, reactive to light. Extraocular muscles intact. No scleral icterus.  MOUTH: Moist mucosal membrane. Dentition intact. No abscess noted.  EAR, NOSE, THROAT: Clear without exudates. No external lesions.  NECK: Supple. No thyromegaly. No nodules. No JVD.  PULMONARY: Bilateral rhonchorous breath sounds worse on the right and crackles at the right. CARDIOVASCULAR: S1 and S2. Regular rate and rhythm. No murmurs, rubs, or gallops. No edema. Pedal pulses 2+ bilaterally.  GASTROINTESTINAL: Soft, nontender, nondistended. No masses. Positive bowel sounds. No hepatosplenomegaly.  MUSCULOSKELETAL: No swelling, clubbing, or edema. Range of motion full in all extremities.  NEUROLOGIC: Cranial nerves II through XII are intact. No gross focal neurological deficits. Sensation intact. Reflexes intact.  SKIN: No ulceration, lesions, rashes, or cyanosis. Skin warm and dry. Turgor intact.  PSYCHIATRIC: Mood, affect within normal limits. The patient is awake, alert and oriented x 3. Insight, judgment intact.       IMAGING    DG Chest 2 View  Result Date: 04/07/2019 CLINICAL DATA:  Chest pain and shortness of breath EXAM: CHEST - 2 VIEW COMPARISON:  March 13, 2019 chest radiograph and chest CT February 13, 2019 FINDINGS: There remains consolidation with volume loss in the right upper lobe with slightly improved aeration in  this area compared to prior studies. Elsewhere, there is extensive underlying emphysematous change with fibrosis. Fibrosis is most severe in the right base region. No new opacity evident. Heart size and pulmonary vascularity are normal. No adenopathy is evident. There is aortic atherosclerosis. No bone lesions. There is calcification in each carotid artery. IMPRESSION: Underlying emphysematous change in fibrosis. Consolidation in the  right upper lobe is slightly less pronounced than on recent prior studies. No new opacity evident. Stable cardiac silhouette. No adenopathy demonstrable by radiography. Aortic Atherosclerosis (ICD10-I70.0). Electronically Signed   By: Lowella Grip III M.D.   On: 04/07/2019 11:20   CT Angio Chest PE W and/or Wo Contrast  Result Date: 04/16/2019 CLINICAL DATA:  Shortness of breath and hypoxia EXAM: CT ANGIOGRAPHY CHEST WITH CONTRAST TECHNIQUE: Multidetector CT imaging of the chest was performed using the standard protocol during bolus administration of intravenous contrast. Multiplanar CT image reconstructions and MIPs were obtained to evaluate the vascular anatomy. CONTRAST:  58m OMNIPAQUE IOHEXOL 350 MG/ML SOLN COMPARISON:  04/07/2019. FINDINGS: Cardiovascular: Atheromatous calcifications, including the coronary arteries and aorta. Normally opacified pulmonary arteries with no pulmonary arterial filling defects seen. The ascending thoracic aorta measures 4.5 cm in maximum diameter, previously 4.6 cm. Aneurysmal dilatation of the descending thoracic aorta with a maximum diameter of 3.5 cm, previously 3.8 cm. Aneurysmal dilatation of the infrarenal abdominal aorta with a maximum diameter of 4.0 cm on the included portions. Mediastinum/Nodes: Mildly enlarged right hilar lymph nodes with little change. The largest measures 1.1 cm in short axis diameter on image number 55 series 4. Thyroid gland, trachea, and esophagus demonstrate no significant findings. Lungs/Pleura: Stable  marked bilateral centrilobular bullous changes. Previously demonstrated right upper lobe consolidation with air bronchograms and focal distal bronchial dilatation without significant change. Small amount of consolidation in the posterior left lower lobe with little change. Moderate-sized right pleural effusion and small to moderate-sized left pleural effusion, both increased. Upper Abdomen: Dilated in for abdominal aorta as described above, not included in its entirety. Musculoskeletal: Stable 20% inferior endplate compression deformity of a midthoracic vertebral body. Review of the MIP images confirms the above findings. IMPRESSION: 1. No pulmonary emboli. 2. Stable right upper lobe consolidation with air bronchograms and focal distal bronchial dilatation. 3. Stable small amount of consolidation in the posterior left lower lobe. 4. Stable mild right hilar adenopathy, most likely reactive. 5. Stable marked changes of COPD. 6. Stable aneurysmal dilatation of the ascending thoracic aorta and descending thoracic aorta with interval included aneurysmal dilatation of the infrarenal abdominal aorta, not included in its entirety, measuring 4.0 cm. 7. Moderate-sized right pleural effusion and small to moderate-sized left pleural effusion, both increased. 8. Calcific coronary artery and aortic atherosclerosis. Aortic Atherosclerosis (ICD10-I70.0) and Emphysema (ICD10-J43.9). Electronically Signed   By: SClaudie ReveringM.D.   On: 04/16/2019 16:28   CT Angio Chest PE W/Cm &/Or Wo Cm  Result Date: 04/07/2019 CLINICAL DATA:  Shortness of breath. EXAM: CT ANGIOGRAPHY CHEST WITH CONTRAST TECHNIQUE: Multidetector CT imaging of the chest was performed using the standard protocol during bolus administration of intravenous contrast. Multiplanar CT image reconstructions and MIPs were obtained to evaluate the vascular anatomy. CONTRAST:  63mOMNIPAQUE IOHEXOL 350 MG/ML SOLN COMPARISON:  Chest radiograph April 07, 2019; chest CT  February 13, 2019 FINDINGS: Cardiovascular: There is no demonstrable pulmonary embolus. Ascending thoracic aortic diameter measures 4.6 x 4.5 cm. No dissection is appreciable. The contrast bolus in the aorta is somewhat less than optimal for dissection assessment. There is localized aneurysmal dilatation in the distal descending thoracic aorta at the diaphragmatic hiatus with localized plaque and calcification along the leftward aspect of this aneurysm. The descending thoracic aorta at the level of the diaphragmatic hiatus measures 3.8 x 3.1 cm. Appearance of this aneurysm is similar to most recent CT. There is aortic atherosclerosis as well as scattered foci of calcification in  proximal visualized great vessels. There are foci of coronary artery calcification. There is left ventricular hypertrophy. No pericardial effusion or pericardial thickening. Mediastinum/Nodes: Thyroid appears unremarkable. There are mildly prominent right hilar lymph nodes, largest measuring 1.5 x 1.3 cm. Subcarinal lymph node measures 1.4 x 1.2 cm. No esophageal lesions are evident. Lungs/Pleura: There is a moderate right pleural effusion with a much smaller left pleural effusion. There is extensive underlying emphysematous change with fibrosis in the lower lung regions, significantly more on the right than on the left. There are multiple areas of cicatrization throughout the lungs bilaterally, more severe on the right than on the left. There is consolidation in the anterior segment right upper lobe with air bronchograms and volume loss. No similar consolidation elsewhere. Upper Abdomen: There is upper abdominal aortic atherosclerosis. Visualized upper abdominal structures otherwise appear unremarkable. Musculoskeletal: Anterior wedging of a midthoracic vertebral body is stable. No blastic or lytic bone lesions. No evident chest wall lesions. Review of the MIP images confirms the above findings. IMPRESSION: 1.  No demonstrable pulmonary  embolus. 2. Prominence of the ascending thoracic aorta with a measured diameter of 4.6 x 4.5 cm. Ascending thoracic aortic aneurysm. Recommend semi-annual imaging followup by CTA or MRA and referral to cardiothoracic surgery if not already obtained. This recommendation follows 2010 ACCF/AHA/AATS/ACR/ASA/SCA/SCAI/SIR/STS/SVM Guidelines for the Diagnosis and Management of Patients With Thoracic Aortic Disease. Circulation. 2010; 121: R740-C144. Aortic aneurysm NOS (ICD10-I71.9). No dissection evident with contrast bolus somewhat less than optimal for dissection assessment. 3. Localized aneurysmal dilatation of the descending thoracic aorta at the diaphragmatic hiatus level measuring 3.8 x 3.1 cm. Localized plaque and dilatation along the leftward aspect of the aneurysm, also present previously. Note that there is aortic atherosclerosis as well as foci of great vessel and coronary artery calcification. 4. 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 Pleural effusions bilaterally, larger on the right than on the left. Persistent consolidation in the right upper lobe anteriorly with air bronchograms. Appearance most consistent with pneumonia with possibility of superimposed neoplasm not excluded anteriorly toward the apex on the right. 5.  Prominent right hilar and subcarinal lymph nodes. Aortic Atherosclerosis (ICD10-I70.0) and Emphysema (ICD10-J43.9). Aortic aneurysm NOS (ICD10-I71.9). Electronically Signed   By: Lowella Grip III M.D.   On: 04/07/2019 13:17   DG Chest Right Decubitus  Result Date: 04/21/2019 CLINICAL DATA:  Dyspnea, recent right-sided thoracentesis EXAM: CHEST - RIGHT DECUBITUS; CHEST - LEFT DECUBITUS COMPARISON:  CT 04/16/2019 FINDINGS: Bilateral decubitus views of the chest were obtained. Small to moderate right-sided pleural effusion with small loculated component at the right upper lobe. Trace layering left-sided pleural effusion without loculated  component. There is no pneumothorax. Chronic consolidation in the right upper lobe. Fibrotic changes throughout both lungs. Stable heart size. IMPRESSION: 1. Negative for pneumothorax. 2. Small to moderate right-sided pleural effusion with small loculated component at the right upper lobe. 3. Trace layering left-sided pleural effusion without loculated component. Electronically Signed   By: Davina Poke D.O.   On: 04/21/2019 11:17   DG Chest Left Decubitus  Result Date: 04/21/2019 CLINICAL DATA:  Dyspnea, recent right-sided thoracentesis EXAM: CHEST - RIGHT DECUBITUS; CHEST - LEFT DECUBITUS COMPARISON:  CT 04/16/2019 FINDINGS: Bilateral decubitus views of the chest were obtained. Small to moderate right-sided pleural effusion with small loculated component at the right upper lobe. Trace layering left-sided pleural effusion without loculated component. There is no pneumothorax. Chronic consolidation in the right upper lobe. Fibrotic changes throughout both  lungs. Stable heart size. IMPRESSION: 1. Negative for pneumothorax. 2. Small to moderate right-sided pleural effusion with small loculated component at the right upper lobe. 3. Trace layering left-sided pleural effusion without loculated component. Electronically Signed   By: Davina Poke D.O.   On: 04/21/2019 11:17   DG Chest Port 1 View  Result Date: 04/17/2019 CLINICAL DATA:  Right-sided thoracentesis EXAM: PORTABLE CHEST 1 VIEW COMPARISON:  Yesterday FINDINGS: Interstitial and airspace opacity in the right upper lobe with volume loss. Normal heart size and stable mediastinal contours. No visible pneumothorax. Trace left pleural effusion. IMPRESSION: 1. No complicating features after thoracentesis. 2. Emphysema and stable right upper lobe opacity with volume loss. Electronically Signed   By: Monte Fantasia M.D.   On: 04/17/2019 11:17   DG Chest Portable 1 View  Result Date: 04/16/2019 CLINICAL DATA:  Shortness of breath EXAM: PORTABLE CHEST  1 VIEW COMPARISON:  April 08, 2019 chest radiograph and chest CT Jul 05 2019 FINDINGS: Extensive fibrosis is noted throughout the lungs bilaterally. There may be a degree of superimposed interstitial edema given increase in interstitial thickening compared to recent CT examination. There is equivocal increase in interstitial thickening bilaterally compared to most recent chest radiograph. There may be patchy airspace opacity intermingled with interstitial prominence. There remains extensive consolidation in the right upper lobe. Heart size and pulmonary vascularity are stable and within normal limits given the underlying fibrosis. No adenopathy is appreciable by radiography. No bone lesions. IMPRESSION: Persistent extensive fibrosis with suspected superimposed interstitial edema and possible interspersed airspace opacity. The degree of ill-defined opacity, primarily in the left lower lobe is modestly increased compared to most recent chest radiograph and is definitely increased compared to most recent CT. Consolidation remains in the right upper lobe. Stable cardiac silhouette. Electronically Signed   By: Lowella Grip III M.D.   On: 04/16/2019 14:43   DG Chest Port 1 View  Result Date: 04/08/2019 CLINICAL DATA:  Post right-sided thoracentesis. EXAM: PORTABLE CHEST 1 VIEW COMPARISON:  04/07/2019; chest CT-04/07/2019 FINDINGS: Grossly unchanged cardiac silhouette and mediastinal contours with persistent obscuration of the right heart border and mediastinal stripe secondary to worsening right upper lobe heterogeneous opacities and associated volume loss. Worsening right mid and bilateral lower lung interstitial thickening. Interval reduction/resolution of right-sided pleural effusion post thoracentesis. No pneumothorax. No acute osseous abnormalities. IMPRESSION: 1. Interval reduction/resolution of right-sided pleural effusion post thoracentesis. No pneumothorax. 2. Worsening right  heterogeneous/consolidative opacities and associated volume loss. 3. Worsening right mid bilateral lower lung interstitial thickening with broad differential considerations including pulmonary edema versus progression of multifocal infection, including atypical etiologies. Electronically Signed   By: Sandi Mariscal M.D.   On: 04/08/2019 17:31   ECHOCARDIOGRAM COMPLETE  Result Date: 04/08/2019    ECHOCARDIOGRAM REPORT   Patient Name:   OTHMAN MASUR Date of Exam: 04/07/2019 Medical Rec #:  [id number]        Height:       65.0 in Accession #:    [id number]       Weight:       120.6 lb Date of Birth:  07-Aug-1955        BSA:          1.60 m Patient Age:    64 years         BP:           121/92 mmHg Patient Gender: M                HR:  96 bpm. Exam Location:  [facility] Procedure: 2D Echo, Cardiac Doppler and Color Doppler Indications:     Elevated Troponin  History:         Patient has no prior history of Echocardiogram examinations.                  Risk Factors:Dyslipidemia and Hypertension. Stroke. Pneumonia.                  Myocardial Infarction. Dyspnea. Coronary artery disease. COPD.                  Chest pain. AAA.  Sonographer:     Wilford Sports Rodgers-Jones Referring Phys:  [place] Diagnosing Phys: Isaias Cowman MD IMPRESSIONS  1. Left ventricular ejection fraction, by estimation, is 35 to 40%. The left ventricle has moderately decreased function. The left ventrical has no regional wall motion abnormalities. The left ventricular internal cavity size was mildly to moderately dilated. Left ventricular diastolic parameters are indeterminate.  2. Right ventricular systolic function is normal. The right ventricular size is normal.  3. No left atrial/left atrial appendage thrombus was detected.  4. The mitral valve is normal in structure and function. Mild mitral valve regurgitation. No evidence of mitral stenosis.  5. The aortic valve is normal in structure and function. Aortic valve  regurgitation is not visualized. Mild to moderate aortic valve stenosis.  6. The inferior vena cava is normal in size with greater than 50% respiratory variability, suggesting right atrial pressure of 3 mmHg. FINDINGS  Left Ventricle: Left ventricular ejection fraction, by estimation, is 35 to 40%. The left ventricle has moderately decreased function. The left ventricle has no regional wall motion abnormalities. The left ventricular internal cavity size was mildly to moderately dilated. There is no. There is no left ventricular hypertrophy. Left ventricular diastolic parameters are indeterminate.  LV Wall Scoring: The basal inferolateral segment, mid inferoseptal segment, apical septal segment, basal inferior segment, and apex are hypokinetic. Right Ventricle: The right ventricular size is normal. No increase in right ventricular wall thickness. Right ventricular systolic function is normal. Left Atrium: Left atrial size was normal in size. Right Atrium: Right atrial size was normal in size. Pericardium: There is no evidence of pericardial effusion. Mitral Valve: The mitral valve is normal in structure and function. Normal mobility of the mitral valve leaflets. Mild mitral valve regurgitation. No evidence of mitral valve stenosis. Tricuspid Valve: The tricuspid valve is normal in structure. Tricuspid valve regurgitation is mild . No evidence of tricuspid stenosis. Aortic Valve: The aortic valve is normal in structure and function. Aortic valve regurgitation is not visualized. Mild to moderate aortic stenosis is present. Aortic valve mean gradient measures 6.7 mmHg. Aortic valve peak gradient measures 9.2 mmHg. Aortic valve area, by VTI measures 0.96 cm. Pulmonic Valve: The pulmonic valve was normal in structure. Pulmonic valve regurgitation is not visualized. No evidence of pulmonic stenosis. Aorta: The aortic root is normal in size and structure. Venous: The inferior vena cava is normal in size with greater than  50% respiratory variability, suggesting right atrial pressure of 3 mmHg. The inferior vena cava and the hepatic vein show a normal flow pattern. IAS/Shunts: No atrial level shunt detected by color flow Doppler.  LEFT VENTRICLE PLAX 2D LVIDd:         5.61 cm  Diastology LVIDs:         4.21 cm  LV e' lateral:   4.35 cm/s LV PW:  0.94 cm  LV E/e' lateral: 13.5 LV IVS:        0.68 cm  LV e' medial:    5.00 cm/s LVOT diam:     2.20 cm  LV E/e' medial:  11.7 LV SV:         23.64 ml LV SV Index:   47.63 LVOT Area:     3.80 cm  RIGHT VENTRICLE RV Basal diam:  3.68 cm RV S prime:     10.40 cm/s TAPSE (M-mode): 1.8 cm LEFT ATRIUM           Index       RIGHT ATRIUM           Index LA diam:      4.20 cm 2.63 cm/m  RA Area:     13.50 cm LA Vol (A2C): 40.0 ml 25.07 ml/m RA Volume:   39.70 ml  24.88 ml/m LA Vol (A4C): 17.9 ml 11.22 ml/m  AORTIC VALVE AV Area (Vmax):    0.94 cm AV Area (Vmean):   0.87 cm AV Area (VTI):     0.96 cm AV Vmax:           152.00 cm/s AV Vmean:          122.333 cm/s AV VTI:            0.246 m AV Peak Grad:      9.2 mmHg AV Mean Grad:      6.7 mmHg LVOT Vmax:         37.45 cm/s LVOT Vmean:        27.900 cm/s LVOT VTI:          0.062 m LVOT/AV VTI ratio: 0.25  AORTA Ao Root diam: 3.60 cm MITRAL VALVE MV Area (PHT): 7.66 cm             SHUNTS MV Decel Time: 99 msec              Systemic VTI:  0.06 m MV E velocity: 58.70 cm/s 103 cm/s  Systemic Diam: 2.20 cm MV A velocity: 52.20 cm/s 70.3 cm/s MV E/A ratio:  1.12       1.5 Isaias Cowman MD Electronically signed by Isaias Cowman MD Signature Date/Time: 04/08/2019/1:00:24 PM    Final    US THORACENTESIS ASP PLEURAL SPACE W/IMG GUIDE  Result Date: 04/17/2019 INDICATION: Patient with history of COPD, shortness of breath, right-sided pleural effusion. Request is made for diagnostic and therapeutic right thoracentesis. EXAM: ULTRASOUND GUIDED RIGHT THORACENTESIS MEDICATIONS: 10 mL 2% nesacaine Patient with documented allergy to  lidocaine, recent tolerance of nesacaine injection 4/94/49 COMPLICATIONS: None immediate. PROCEDURE: An ultrasound guided thoracentesis was thoroughly discussed with the patient and questions answered. The benefits, risks, alternatives and complications were also discussed. The patient understands and wishes to proceed with the procedure. Written consent was obtained. Ultrasound was performed to localize and mark an adequate pocket of fluid in the right chest. The area was then prepped and draped in the normal sterile fashion. 1% Lidocaine was used for local anesthesia. Under ultrasound guidance a 6 Fr Safe-T-Centesis catheter was introduced. Thoracentesis was performed. The catheter was removed and a dressing applied. FINDINGS: A total of approximately 700 mL of yellow, clear fluid was removed. Samples were sent to the laboratory as requested by the clinical team. IMPRESSION: Successful ultrasound guided right thoracentesis yielding 700 mL of pleural fluid. Read by: Brynda Greathouse PA-C Electronically Signed   By: Aletta Edouard M.D.   On: 04/17/2019  11:26   US THORACENTESIS ASP PLEURAL SPACE W/IMG GUIDE  Addendum Date: 04/08/2019   ADDENDUM REPORT: 04/08/2019 17:38 ADDENDUM: Note, Nesacaine was utilized for subcutaneous anesthesia purposes given history of lidocaine allergy. Patient tolerated this medication without incident. Electronically Signed   By: Sandi Mariscal M.D.   On: 04/08/2019 17:38   Result Date: 04/08/2019 INDICATION: Symptomatic right-sided sided pleural effusion EXAM: US THORACENTESIS ASP PLEURAL SPACE W/IMG GUIDE COMPARISON:  Chest radiograph-04/07/2019; chest CT-04/06/2018 MEDICATIONS: None. COMPLICATIONS: None immediate. TECHNIQUE: Informed written consent was obtained from the patient after a discussion of the risks, benefits and alternatives to treatment. A timeout was performed prior to the initiation of the procedure. Initial ultrasound scanning demonstrates a small anechoic right-sided  pleural effusion. The lower chest was prepped and draped in the usual sterile fashion. 1% lidocaine was used for local anesthesia. An ultrasound image was saved for documentation purposes. An 8 Fr Safe-T-Centesis catheter was introduced under ultrasound guidance. The thoracentesis was performed. The catheter was removed and a dressing was applied. The patient tolerated the procedure well without immediate post procedural complication. The patient was escorted to have an upright chest radiograph. FINDINGS: A total of approximately 450 cc of serous fluid was removed. Requested samples were sent to the laboratory. IMPRESSION: Successful ultrasound-guided right sided thoracentesis yielding 450 cc of pleural fluid. Electronically Signed: By: Sandi Mariscal M.D. On: 04/08/2019 17:33      ASSESSMENT/PLAN   Moderate Acute exacerbation of COPD  -Continue DuoNebs every 6 hours, we will add MetaNeb with hypertonic saline today to allow patient to expectorate thickened phlegm in preparation for discharge home.  -COPD care path -Budesonide 0.5 mg-we will hold her this for now -Continue prednisone as prescribed -Aggressive bronchopulmonary hygiene protocol-discussed with Amy RT today -MetaNeb therapy every 6 with duo nebs and hypertonic saline with respiratory therapist -Incentive spirometer and Acapella flutter valve -Physical and Occupational Therapy -I will stop 3 out 4 of his narcotics and also stop tessalon to help him cough otherwise he may continue get plugged up and atelectatic.  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 -Patient has been started on Zithromax, rifampin, ethambutol -Appreciate infectious disease evaluation and recommendations -Have set up patient for afflo vest therapy to be used twice daily 20 to 30-minute sessions -Care management consultation for home PT -Pulmonary rehab on outpatient basis    Moderate  right pleural effusion   -Contributing to hypoxemia -ultrasound-guided thoracentesis -Pleural fluid studies with ADA, AFB, pH, glucose, cytology, LDH, albumin, protein triglycerides,    Moderate protein calorie malnutrition -Bitemporal wasting -Albumin 2.5 -Overall poor prognosis-will obtain palliative care consultation due to severe chronic lung disease with recurrent admissions for acute on chronic hypoxemia due to severe end-stage COPD with bullous emphysema, bronchiectasis, Mycobacterium avium complex     Thank you for allowing me to participate in the care of this patient.   Patient/Family are satisfied with care plan and all questions have been answered.  This document was prepared using Dragon voice recognition software and may include unintentional dictation errors.     FOttie Glazier M.D.  Division of [facility]

## 2019-04-22 ENCOUNTER — Inpatient Hospital Stay (HOSPITAL_COMMUNITY): Payer: Medicare Other

## 2019-04-22 ENCOUNTER — Inpatient Hospital Stay: Payer: Medicare Other

## 2019-04-22 DIAGNOSIS — R0602 Shortness of breath: Secondary | ICD-10-CM

## 2019-04-22 DIAGNOSIS — J90 Pleural effusion, not elsewhere classified: Secondary | ICD-10-CM

## 2019-04-22 DIAGNOSIS — J449 Chronic obstructive pulmonary disease, unspecified: Secondary | ICD-10-CM

## 2019-04-22 DIAGNOSIS — F418 Other specified anxiety disorders: Secondary | ICD-10-CM

## 2019-04-22 LAB — CBC
HCT: 38.5 % — ABNORMAL LOW (ref 39.0–52.0)
Hemoglobin: 12.5 g/dL — ABNORMAL LOW (ref 13.0–17.0)
MCH: 28.9 pg (ref 26.0–34.0)
MCHC: 32.5 g/dL (ref 30.0–36.0)
MCV: 88.9 fL (ref 80.0–100.0)
Platelets: 251 10*3/uL (ref 150–400)
RBC: 4.33 MIL/uL (ref 4.22–5.81)
RDW: 14.4 % (ref 11.5–15.5)
WBC: 9.2 10*3/uL (ref 4.0–10.5)
nRBC: 0 % (ref 0.0–0.2)

## 2019-04-22 LAB — BODY FLUID CELL COUNT WITH DIFFERENTIAL
Eos, Fluid: 0 %
Lymphs, Fluid: 80 %
Monocyte-Macrophage-Serous Fluid: 7 %
Neutrophil Count, Fluid: 13 %
Total Nucleated Cell Count, Fluid: 745 cu mm

## 2019-04-22 LAB — GLUCOSE, PLEURAL OR PERITONEAL FLUID: Glucose, Fluid: 121 mg/dL

## 2019-04-22 LAB — PROTEIN, PLEURAL OR PERITONEAL FLUID: Total protein, fluid: <3 g/dL

## 2019-04-22 LAB — AMYLASE, PLEURAL OR PERITONEAL FLUID: Amylase, Fluid: 36 U/L

## 2019-04-22 LAB — ALBUMIN, PLEURAL OR PERITONEAL FLUID: Albumin, Fluid: 1.1 g/dL

## 2019-04-22 LAB — LACTATE DEHYDROGENASE, PLEURAL OR PERITONEAL FLUID: LD, Fluid: 71 U/L — ABNORMAL HIGH (ref 3–23)

## 2019-04-22 MED ORDER — CHLOROPROCAINE HCL 2 % IJ SOLN
20.0000 mL | Freq: Once | INTRAMUSCULAR | Status: DC
  Filled 2019-04-22: qty 30

## 2019-04-22 NOTE — Progress Notes (Signed)
Pulmonary Medicine          Date: 04/22/2019,   MRN# [id number] Joseph Peters [facility] Jan 24, 1956     AdmissionWeight: 54.3 kg                 CurrentWeight: 53.3 kg   Referring Physician: Dr Jimmye Norman   CHIEF COMPLAINT:   Advanced Emphysema with Mycobacterium Avium Complex    SUBJECTIVE   Patient sitting up in bed.  He had good night and feeling better today.  He started mucomyst and has been able to expectorate thick phlegm with streaks of blood.   He had thoracentesis - fluid studies pending but thus far appears to be transudate with normal glucose which is reassuring , the cytology is pending.   PAST MEDICAL HISTORY   Past Medical History:  Diagnosis Date  . AAA (abdominal aortic aneurysm) without rupture ([place])   . Abdominal aneurysm ([place]) 2015   being followed but not large enough to surgically treat  . Alcoholism ([place])   . Anxiety   . Arthritis   . Asthma   . Benign hypertension 07/16/2014  . Chest pain on exertion 07/16/2014  . Chronic pain syndrome   . Coccydynia 02/08/2012  . COPD (chronic obstructive pulmonary disease) ([place])    patient smokes  . Coronary artery disease   . 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 Depression   . 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 Dyspnea   . Frequent falls   . GERD (gastroesophageal reflux disease)   . Hyperlipidemia   . IBS (irritable bowel syndrome)   . Myocardial infarction ([place]) 2010  . Neuropathy   . Pneumonia   . Prostatitis   . Restless leg syndrome   . Sciatica   . Spinal stenosis of lumbar region   . Stroke ([place]) 2015   no residual symptoms  . Tremor, essential      SURGICAL HISTORY   Past Surgical History:  Procedure Laterality Date  . CARDIAC CATHETERIZATION  2012  . COLONOSCOPY WITH PROPOFOL N/A 11/14/2016   Procedure: COLONOSCOPY WITH PROPOFOL;  Surgeon: Manya Silvas, MD;  Location: [facility] ENDOSCOPY;  Service: Endoscopy;  Laterality: N/A;  .  ESOPHAGOGASTRODUODENOSCOPY (EGD) WITH PROPOFOL N/A 11/14/2016   Procedure: ESOPHAGOGASTRODUODENOSCOPY (EGD) WITH PROPOFOL;  Surgeon: Manya Silvas, MD;  Location: [facility] ENDOSCOPY;  Service: Endoscopy;  Laterality: N/A;  . JOINT REPLACEMENT Right    hip  . KNEE SURGERY     right  . SHOULDER ARTHROSCOPY WITH OPEN ROTATOR CUFF REPAIR Right 08/20/2017   Procedure: SHOULDER ARTHROSCOPY WITH OPEN ROTATOR CUFF REPAIR;  Surgeon: Corky Mull, MD;  Location: [facility] ORS;  Service: Orthopedics;  Laterality: Right;  . SHOULDER ARTHROSCOPY WITH SUBACROMIAL DECOMPRESSION, ROTATOR CUFF REPAIR AND BICEP TENDON REPAIR Left 05/10/2016   Procedure: SHOULDER ARTHROSCOPY WITH DEBTRIDEMENT, DECOMPRESSION, REPAIR OF MASSIVE ROTATOR CUFF TEAR AND BICEP TENODESIS;  Surgeon: Corky Mull, MD;  Location: [facility] ORS;  Service: Orthopedics;  Laterality: Left;  Massive cuff tear  . TOTAL HIP ARTHROPLASTY Right 07/01/2018   Procedure: TOTAL HIP ARTHROPLASTY - RIGHT;  Surgeon: Corky Mull, MD;  Location: [facility] ORS;  Service: Orthopedics;  Laterality: Right;  Marland Kitchen VIDEO BRONCHOSCOPY WITH ENDOBRONCHIAL NAVIGATION N/A 02/13/2019   Procedure: VIDEO BRONCHOSCOPY WITH ENDOBRONCHIAL NAVIGATION;  Surgeon: Ottie Glazier, MD;  Location: [facility] ORS;  Service: Thoracic;  Laterality: N/A;  . VIDEO BRONCHOSCOPY WITH ENDOBRONCHIAL NAVIGATION N/A 03/13/2019   Procedure: VIDEO BRONCHOSCOPY WITH ENDOBRONCHIAL  NAVIGATION;  Surgeon: Ottie Glazier, MD;  Location: [facility] ORS;  Service: Thoracic;  Laterality: N/A;  . VIDEO BRONCHOSCOPY WITH ENDOBRONCHIAL ULTRASOUND N/A 02/13/2019   Procedure: VIDEO BRONCHOSCOPY WITH ENDOBRONCHIAL ULTRASOUND;  Surgeon: Ottie Glazier, MD;  Location: [facility] ORS;  Service: Thoracic;  Laterality: N/A;  . VIDEO BRONCHOSCOPY WITH ENDOBRONCHIAL ULTRASOUND N/A 03/13/2019   Procedure: VIDEO BRONCHOSCOPY WITH ENDOBRONCHIAL ULTRASOUND;  Surgeon: Ottie Glazier, MD;  Location: [facility] ORS;  Service: Thoracic;  Laterality: N/A;     FAMILY  HISTORY   Family History  Problem Relation Age of Onset  . Cancer Mother   . COPD Father 71       black lung     SOCIAL HISTORY   Social History   Tobacco Use  . Smoking status: Light Tobacco Smoker    Packs/day: 0.50    Years: 30.00    Pack years: 15.00    Types: Cigarettes    Start date: 02/26/1973    Last attempt to quit: 09/21/2018    Years since quitting: 0.5  . Smokeless tobacco: Former Systems developer    Quit date: 02/26/1998  . Tobacco comment: smokes 2-3 cig a week 02/12/19  Substance Use Topics  . Alcohol use: Yes    Alcohol/week: 20.0 standard drinks    Types: 20 Cans of beer per week    Comment: week   . Drug use: Yes    Comment: prescribed hydrocodone     MEDICATIONS    Home Medication:    Current Medication:  Current Facility-Administered Medications:  .  acetylcysteine (MUCOMYST) 20 % nebulizer / oral solution 4 mL, 4 mL, Nebulization, BID, Lanney Gins, Levie Owensby, MD, 4 mL at 04/21/19 1944 .  azithromycin (ZITHROMAX) tablet 500 mg, 500 mg, Oral, Q0600, Tsosie Billing, MD, 500 mg at 04/22/19 0516 .  Chlorhexidine Gluconate Cloth 2 % PADS 6 each, 6 each, Topical, Daily, Max Sane, MD, 6 each at 04/22/19 1058 .  chloroprocaine (NESACAINE-MPF) 2 % injection 10 mL, 10 mL, Infiltration, Once, Matthews, Herma Carson, PA .  chloroprocaine (NESACAINE-MPF) 2 % injection 20 mL, 20 mL, Other, Once, Jacqulynn Cadet, MD .  enoxaparin (LOVENOX) injection 40 mg, 40 mg, Subcutaneous, Q24H, Blakeney, Dana G, NP, 40 mg at 04/21/19 1718 .  ethambutol (MYAMBUTOL) tablet 400 mg, 400 mg, Oral, BID, Tsosie Billing, MD, 400 mg at 04/22/19 0904 .  feeding supplement (ENSURE ENLIVE) (ENSURE ENLIVE) liquid 237 mL, 237 mL, Oral, TID BM, Manuella Ghazi, Vipul, MD, 237 mL at 04/22/19 1403 .  finasteride (PROSCAR) tablet 5 mg, 5 mg, Oral, Daily, Max Sane, MD, 5 mg at 04/22/19 0903 .  FLUoxetine (PROZAC) capsule 20 mg, 20 mg, Oral, Daily, Max Sane, MD, 20 mg at 04/22/19 0904 .   gabapentin (NEURONTIN) capsule 900 mg, 900 mg, Oral, QHS, Max Sane, MD, 900 mg at 04/21/19 2157 .  ipratropium-albuterol (DUONEB) 0.5-2.5 (3) MG/3ML nebulizer solution 3 mL, 3 mL, Nebulization, Q6H, Blakeney, Dana G, NP, 3 mL at 04/22/19 1331 .  ipratropium-albuterol (DUONEB) 0.5-2.5 (3) MG/3ML nebulizer solution 3 mL, 3 mL, Nebulization, Q6H PRN, Awilda Bill, NP .  losartan (COZAAR) tablet 25 mg, 25 mg, Oral, Daily, Max Sane, MD, 25 mg at 04/22/19 0903 .  MEDLINE mouth rinse, 15 mL, Mouth Rinse, BID, Awilda Bill, NP, 15 mL at 04/22/19 0916 .  metoprolol succinate (TOPROL-XL) 24 hr tablet 25 mg, 25 mg, Oral, Daily, Max Sane, MD, 25 mg at 04/22/19 0903 .  mirtazapine (REMERON) tablet 7.5 mg, 7.5 mg, Oral, QHS, Mahan, Kasie J,  NP, 7.5 mg at 04/21/19 2157 .  montelukast (SINGULAIR) tablet 10 mg, 10 mg, Oral, QHS, Max Sane, MD, 10 mg at 04/21/19 2157 .  morphine CONCENTRATE 10 MG/0.5ML oral solution 10 mg, 10 mg, Oral, Q2H PRN, Earlie Counts, NP, 10 mg at 04/21/19 1711 .  multivitamin with minerals tablet 1 tablet, 1 tablet, Oral, Daily, Max Sane, MD, 1 tablet at 04/22/19 0903 .  ondansetron (ZOFRAN-ODT) disintegrating tablet 4 mg, 4 mg, Oral, Q8H PRN, Max Sane, MD, 4 mg at 04/17/19 1025 .  rifampin (RIFADIN) capsule 300 mg, 300 mg, Oral, BID, Ravishankar, Jayashree, MD, 300 mg at 04/22/19 0903 .  roflumilast (DALIRESP) tablet 250 mcg, 250 mcg, Oral, Daily, Max Sane, MD, 250 mcg at 04/22/19 0904 .  sodium chloride 0.9 % bolus 1,000 mL, 1,000 mL, Intravenous, Once, Duffy Bruce, MD, Stopped at 04/16/19 1450 .  sodium chloride HYPERTONIC 3 % nebulizer solution 3 mL, 3 mL, Nebulization, Q6H WA, Lanney Gins, Fielding Mault, MD, 3 mL at 04/21/19 1944 .  tamsulosin (FLOMAX) capsule 0.4 mg, 0.4 mg, Oral, QPC supper, Max Sane, MD, 0.4 mg at 04/21/19 1718    ALLERGIES   Penicillins and Lidocaine     REVIEW OF SYSTEMS    Review of Systems:  Gen:  Denies  fever, sweats,  chills weigh loss  HEENT: Denies blurred vision, double vision, ear pain, eye pain, hearing loss, nose bleeds, sore throat Cardiac:  No dizziness, chest pain or heaviness, chest tightness,edema Resp:   Denies cough or sputum porduction, shortness of breath,wheezing, hemoptysis,  Gi: Denies swallowing difficulty, stomach pain, nausea or vomiting, diarrhea, constipation, bowel incontinence Gu:  Denies bladder incontinence, burning urine Ext:   Denies Joint pain, stiffness or swelling Skin: Denies  skin rash, easy bruising or bleeding or hives Endoc:  Denies polyuria, polydipsia , polyphagia or weight change Psych:   Denies depression, insomnia or hallucinations   Other:  All other systems negative   VS: BP 114/86 (BP Location: Left Arm)   Pulse 84   Temp 98.8 F (37.1 C) (Oral)   Resp 16   Ht _0  (1.651 m)   Wt 53.3 kg   SpO2 95%   BMI 19.55 kg/m      PHYSICAL EXAM    GENERAL:NAD, no fevers, chills, no weakness no fatigue HEAD: Normocephalic, atraumatic.  EYES: Pupils equal, round, reactive to light. Extraocular muscles intact. No scleral icterus.  MOUTH: Moist mucosal membrane. Dentition intact. No abscess noted.  EAR, NOSE, THROAT: Clear without exudates. No external lesions.  NECK: Supple. No thyromegaly. No nodules. No JVD.  PULMONARY: Improved aeriation from previous. Mild rhonci and decreased bs at RUL CARDIOVASCULAR: S1 and S2. Regular rate and rhythm. No murmurs, rubs, or gallops. No edema. Pedal pulses 2+ bilaterally.  GASTROINTESTINAL: Soft, nontender, nondistended. No masses. Positive bowel sounds. No hepatosplenomegaly.  MUSCULOSKELETAL: No swelling, clubbing, or edema. Range of motion full in all extremities.  NEUROLOGIC: Cranial nerves II through XII are intact. No gross focal neurological deficits. Sensation intact. Reflexes intact.  SKIN: No ulceration, lesions, rashes, or cyanosis. Skin warm and dry. Turgor intact.  PSYCHIATRIC: Mood, affect within normal  limits. The patient is awake, alert and oriented x 3. Insight, judgment intact.       IMAGING    DG Chest 2 View  Result Date: 04/07/2019 CLINICAL DATA:  Chest pain and shortness of breath EXAM: CHEST - 2 VIEW COMPARISON:  March 13, 2019 chest radiograph and chest CT February 13, 2019 FINDINGS: There remains consolidation  with volume loss in the right upper lobe with slightly improved aeration in this area compared to prior studies. Elsewhere, there is extensive underlying emphysematous change with fibrosis. Fibrosis is most severe in the right base region. No new opacity evident. Heart size and pulmonary vascularity are normal. No adenopathy is evident. There is aortic atherosclerosis. No bone lesions. There is calcification in each carotid artery. IMPRESSION: Underlying emphysematous change in fibrosis. Consolidation in the right upper lobe is slightly less pronounced than on recent prior studies. No new opacity evident. Stable cardiac silhouette. No adenopathy demonstrable by radiography. Aortic Atherosclerosis (ICD10-I70.0). Electronically Signed   By: Lowella Grip III M.D.   On: 04/07/2019 11:20   CT Angio Chest PE W and/or Wo Contrast  Result Date: 04/16/2019 CLINICAL DATA:  Shortness of breath and hypoxia EXAM: CT ANGIOGRAPHY CHEST WITH CONTRAST TECHNIQUE: Multidetector CT imaging of the chest was performed using the standard protocol during bolus administration of intravenous contrast. Multiplanar CT image reconstructions and MIPs were obtained to evaluate the vascular anatomy. CONTRAST:  19m OMNIPAQUE IOHEXOL 350 MG/ML SOLN COMPARISON:  04/07/2019. FINDINGS: Cardiovascular: Atheromatous calcifications, including the coronary arteries and aorta. Normally opacified pulmonary arteries with no pulmonary arterial filling defects seen. The ascending thoracic aorta measures 4.5 cm in maximum diameter, previously 4.6 cm. Aneurysmal dilatation of the descending thoracic aorta with a maximum  diameter of 3.5 cm, previously 3.8 cm. Aneurysmal dilatation of the infrarenal abdominal aorta with a maximum diameter of 4.0 cm on the included portions. Mediastinum/Nodes: Mildly enlarged right hilar lymph nodes with little change. The largest measures 1.1 cm in short axis diameter on image number 55 series 4. Thyroid gland, trachea, and esophagus demonstrate no significant findings. Lungs/Pleura: Stable marked bilateral centrilobular bullous changes. Previously demonstrated right upper lobe consolidation with air bronchograms and focal distal bronchial dilatation without significant change. Small amount of consolidation in the posterior left lower lobe with little change. Moderate-sized right pleural effusion and small to moderate-sized left pleural effusion, both increased. Upper Abdomen: Dilated in for abdominal aorta as described above, not included in its entirety. Musculoskeletal: Stable 20% inferior endplate compression deformity of a midthoracic vertebral body. Review of the MIP images confirms the above findings. IMPRESSION: 1. No pulmonary emboli. 2. Stable right upper lobe consolidation with air bronchograms and focal distal bronchial dilatation. 3. Stable small amount of consolidation in the posterior left lower lobe. 4. Stable mild right hilar adenopathy, most likely reactive. 5. Stable marked changes of COPD. 6. Stable aneurysmal dilatation of the ascending thoracic aorta and descending thoracic aorta with interval included aneurysmal dilatation of the infrarenal abdominal aorta, not included in its entirety, measuring 4.0 cm. 7. Moderate-sized right pleural effusion and small to moderate-sized left pleural effusion, both increased. 8. Calcific coronary artery and aortic atherosclerosis. Aortic Atherosclerosis (ICD10-I70.0) and Emphysema (ICD10-J43.9). Electronically Signed   By: SClaudie ReveringM.D.   On: 04/16/2019 16:28   CT Angio Chest PE W/Cm &/Or Wo Cm  Result Date: 04/07/2019 CLINICAL DATA:   Shortness of breath. EXAM: CT ANGIOGRAPHY CHEST WITH CONTRAST TECHNIQUE: Multidetector CT imaging of the chest was performed using the standard protocol during bolus administration of intravenous contrast. Multiplanar CT image reconstructions and MIPs were obtained to evaluate the vascular anatomy. CONTRAST:  666mOMNIPAQUE IOHEXOL 350 MG/ML SOLN COMPARISON:  Chest radiograph April 07, 2019; chest CT February 13, 2019 FINDINGS: Cardiovascular: There is no demonstrable pulmonary embolus. Ascending thoracic aortic diameter measures 4.6 x 4.5 cm. No dissection is appreciable. The contrast bolus in  the aorta is somewhat less than optimal for dissection assessment. There is localized aneurysmal dilatation in the distal descending thoracic aorta at the diaphragmatic hiatus with localized plaque and calcification along the leftward aspect of this aneurysm. The descending thoracic aorta at the level of the diaphragmatic hiatus measures 3.8 x 3.1 cm. Appearance of this aneurysm is similar to most recent CT. There is aortic atherosclerosis as well as scattered foci of calcification in proximal visualized great vessels. There are foci of coronary artery calcification. There is left ventricular hypertrophy. No pericardial effusion or pericardial thickening. Mediastinum/Nodes: Thyroid appears unremarkable. There are mildly prominent right hilar lymph nodes, largest measuring 1.5 x 1.3 cm. Subcarinal lymph node measures 1.4 x 1.2 cm. No esophageal lesions are evident. Lungs/Pleura: There is a moderate right pleural effusion with a much smaller left pleural effusion. There is extensive underlying emphysematous change with fibrosis in the lower lung regions, significantly more on the right than on the left. There are multiple areas of cicatrization throughout the lungs bilaterally, more severe on the right than on the left. There is consolidation in the anterior segment right upper lobe with air bronchograms and volume loss. No  similar consolidation elsewhere. Upper Abdomen: There is upper abdominal aortic atherosclerosis. Visualized upper abdominal structures otherwise appear unremarkable. Musculoskeletal: Anterior wedging of a midthoracic vertebral body is stable. No blastic or lytic bone lesions. No evident chest wall lesions. Review of the MIP images confirms the above findings. IMPRESSION: 1.  No demonstrable pulmonary embolus. 2. Prominence of the ascending thoracic aorta with a measured diameter of 4.6 x 4.5 cm. Ascending thoracic aortic aneurysm. Recommend semi-annual imaging followup by CTA or MRA and referral to cardiothoracic surgery if not already obtained. This recommendation follows 2010 ACCF/AHA/AATS/ACR/ASA/SCA/SCAI/SIR/STS/SVM Guidelines for the Diagnosis and Management of Patients With Thoracic Aortic Disease. Circulation. 2010; 121: [id number]. Aortic aneurysm NOS (ICD10-I71.9). No dissection evident with contrast bolus somewhat less than optimal for dissection assessment. 3. Localized aneurysmal dilatation of the descending thoracic aorta at the diaphragmatic hiatus level measuring 3.8 x 3.1 cm. Localized plaque and dilatation along the leftward aspect of the aneurysm, also present previously. Note that there is aortic atherosclerosis as well as foci of great vessel and coronary artery calcification. 4. 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 Pleural effusions bilaterally, larger on the right than on the left. Persistent consolidation in the right upper lobe anteriorly with air bronchograms. Appearance most consistent with pneumonia with possibility of superimposed neoplasm not excluded anteriorly toward the apex on the right. 5.  Prominent right hilar and subcarinal lymph nodes. Aortic Atherosclerosis (ICD10-I70.0) and Emphysema (ICD10-J43.9). Aortic aneurysm NOS (ICD10-I71.9). Electronically Signed   By: Lowella Grip III M.D.   On: 04/07/2019 13:17   DG Chest Right  Decubitus  Result Date: 04/21/2019 CLINICAL DATA:  Dyspnea, recent right-sided thoracentesis EXAM: CHEST - RIGHT DECUBITUS; CHEST - LEFT DECUBITUS COMPARISON:  CT 04/16/2019 FINDINGS: Bilateral decubitus views of the chest were obtained. Small to moderate right-sided pleural effusion with small loculated component at the right upper lobe. Trace layering left-sided pleural effusion without loculated component. There is no pneumothorax. Chronic consolidation in the right upper lobe. Fibrotic changes throughout both lungs. Stable heart size. IMPRESSION: 1. Negative for pneumothorax. 2. Small to moderate right-sided pleural effusion with small loculated component at the right upper lobe. 3. Trace layering left-sided pleural effusion without loculated component. Electronically Signed   By: Davina Poke D.O.   On: 04/21/2019 11:17  DG Chest Left Decubitus  Result Date: 04/21/2019 CLINICAL DATA:  Dyspnea, recent right-sided thoracentesis EXAM: CHEST - RIGHT DECUBITUS; CHEST - LEFT DECUBITUS COMPARISON:  CT 04/16/2019 FINDINGS: Bilateral decubitus views of the chest were obtained. Small to moderate right-sided pleural effusion with small loculated component at the right upper lobe. Trace layering left-sided pleural effusion without loculated component. There is no pneumothorax. Chronic consolidation in the right upper lobe. Fibrotic changes throughout both lungs. Stable heart size. IMPRESSION: 1. Negative for pneumothorax. 2. Small to moderate right-sided pleural effusion with small loculated component at the right upper lobe. 3. Trace layering left-sided pleural effusion without loculated component. Electronically Signed   By: Davina Poke D.O.   On: 04/21/2019 11:17   DG Chest Port 1 View  Result Date: 04/22/2019 CLINICAL DATA:  Right-sided pleural effusion EXAM: PORTABLE CHEST 1 VIEW COMPARISON:  04/17/2019 FINDINGS: Stable cardiomediastinal contours. Chronic consolidation within the right upper lobe.  Fibrotic changes throughout both lungs. No significant pleural fluid collection is seen on frontal view. No pneumothorax IMPRESSION: No pneumothorax status post thoracentesis. Electronically Signed   By: Davina Poke D.O.   On: 04/22/2019 13:06   DG Chest Port 1 View  Result Date: 04/17/2019 CLINICAL DATA:  Right-sided thoracentesis EXAM: PORTABLE CHEST 1 VIEW COMPARISON:  Yesterday FINDINGS: Interstitial and airspace opacity in the right upper lobe with volume loss. Normal heart size and stable mediastinal contours. No visible pneumothorax. Trace left pleural effusion. IMPRESSION: 1. No complicating features after thoracentesis. 2. Emphysema and stable right upper lobe opacity with volume loss. Electronically Signed   By: Monte Fantasia M.D.   On: 04/17/2019 11:17   DG Chest Portable 1 View  Result Date: 04/16/2019 CLINICAL DATA:  Shortness of breath EXAM: PORTABLE CHEST 1 VIEW COMPARISON:  April 08, 2019 chest radiograph and chest CT Jul 05 2019 FINDINGS: Extensive fibrosis is noted throughout the lungs bilaterally. There may be a degree of superimposed interstitial edema given increase in interstitial thickening compared to recent CT examination. There is equivocal increase in interstitial thickening bilaterally compared to most recent chest radiograph. There may be patchy airspace opacity intermingled with interstitial prominence. There remains extensive consolidation in the right upper lobe. Heart size and pulmonary vascularity are stable and within normal limits given the underlying fibrosis. No adenopathy is appreciable by radiography. No bone lesions. IMPRESSION: Persistent extensive fibrosis with suspected superimposed interstitial edema and possible interspersed airspace opacity. The degree of ill-defined opacity, primarily in the left lower lobe is modestly increased compared to most recent chest radiograph and is definitely increased compared to most recent CT. Consolidation remains in the  right upper lobe. Stable cardiac silhouette. Electronically Signed   By: Lowella Grip III M.D.   On: 04/16/2019 14:43   DG Chest Port 1 View  Result Date: 04/08/2019 CLINICAL DATA:  Post right-sided thoracentesis. EXAM: PORTABLE CHEST 1 VIEW COMPARISON:  04/07/2019; chest CT-04/07/2019 FINDINGS: Grossly unchanged cardiac silhouette and mediastinal contours with persistent obscuration of the right heart border and mediastinal stripe secondary to worsening right upper lobe heterogeneous opacities and associated volume loss. Worsening right mid and bilateral lower lung interstitial thickening. Interval reduction/resolution of right-sided pleural effusion post thoracentesis. No pneumothorax. No acute osseous abnormalities. IMPRESSION: 1. Interval reduction/resolution of right-sided pleural effusion post thoracentesis. No pneumothorax. 2. Worsening right heterogeneous/consolidative opacities and associated volume loss. 3. Worsening right mid bilateral lower lung interstitial thickening with broad differential considerations including pulmonary edema versus progression of multifocal infection, including atypical etiologies. Electronically Signed   By:  Sandi Mariscal M.D.   On: 04/08/2019 17:31   ECHOCARDIOGRAM COMPLETE  Result Date: 04/08/2019    ECHOCARDIOGRAM REPORT   Patient Name:   DAIMEN SHOVLIN Date of Exam: 04/07/2019 Medical Rec #:  [id number]        Height:       65.0 in Accession #:    [id number]       Weight:       120.6 lb Date of Birth:  28-Jan-1956        BSA:          1.60 m Patient Age:    36 years         BP:           121/92 mmHg Patient Gender: M                HR:           96 bpm. Exam Location:  [facility] Procedure: 2D Echo, Cardiac Doppler and Color Doppler Indications:     Elevated Troponin  History:         Patient has no prior history of Echocardiogram examinations.                  Risk Factors:Dyslipidemia and Hypertension. Stroke. Pneumonia.                  Myocardial Infarction. Dyspnea.  Coronary artery disease. COPD.                  Chest pain. AAA.  Sonographer:     Wilford Sports Rodgers-Jones Referring Phys:  [place] Diagnosing Phys: Isaias Cowman MD IMPRESSIONS  1. Left ventricular ejection fraction, by estimation, is 35 to 40%. The left ventricle has moderately decreased function. The left ventrical has no regional wall motion abnormalities. The left ventricular internal cavity size was mildly to moderately dilated. Left ventricular diastolic parameters are indeterminate.  2. Right ventricular systolic function is normal. The right ventricular size is normal.  3. No left atrial/left atrial appendage thrombus was detected.  4. The mitral valve is normal in structure and function. Mild mitral valve regurgitation. No evidence of mitral stenosis.  5. The aortic valve is normal in structure and function. Aortic valve regurgitation is not visualized. Mild to moderate aortic valve stenosis.  6. The inferior vena cava is normal in size with greater than 50% respiratory variability, suggesting right atrial pressure of 3 mmHg. FINDINGS  Left Ventricle: Left ventricular ejection fraction, by estimation, is 35 to 40%. The left ventricle has moderately decreased function. The left ventricle has no regional wall motion abnormalities. The left ventricular internal cavity size was mildly to moderately dilated. There is no. There is no left ventricular hypertrophy. Left ventricular diastolic parameters are indeterminate.  LV Wall Scoring: The basal inferolateral segment, mid inferoseptal segment, apical septal segment, basal inferior segment, and apex are hypokinetic. Right Ventricle: The right ventricular size is normal. No increase in right ventricular wall thickness. Right ventricular systolic function is normal. Left Atrium: Left atrial size was normal in size. Right Atrium: Right atrial size was normal in size. Pericardium: There is no evidence of pericardial effusion. Mitral Valve:  The mitral valve is normal in structure and function. Normal mobility of the mitral valve leaflets. Mild mitral valve regurgitation. No evidence of mitral valve stenosis. Tricuspid Valve: The tricuspid valve is normal in structure. Tricuspid valve regurgitation is mild . No evidence of tricuspid stenosis. Aortic Valve: The aortic valve is normal  in structure and function. Aortic valve regurgitation is not visualized. Mild to moderate aortic stenosis is present. Aortic valve mean gradient measures 6.7 mmHg. Aortic valve peak gradient measures 9.2 mmHg. Aortic valve area, by VTI measures 0.96 cm. Pulmonic Valve: The pulmonic valve was normal in structure. Pulmonic valve regurgitation is not visualized. No evidence of pulmonic stenosis. Aorta: The aortic root is normal in size and structure. Venous: The inferior vena cava is normal in size with greater than 50% respiratory variability, suggesting right atrial pressure of 3 mmHg. The inferior vena cava and the hepatic vein show a normal flow pattern. IAS/Shunts: No atrial level shunt detected by color flow Doppler.  LEFT VENTRICLE PLAX 2D LVIDd:         5.61 cm  Diastology LVIDs:         4.21 cm  LV e' lateral:   4.35 cm/s LV PW:         0.94 cm  LV E/e' lateral: 13.5 LV IVS:        0.68 cm  LV e' medial:    5.00 cm/s LVOT diam:     2.20 cm  LV E/e' medial:  11.7 LV SV:         23.64 ml LV SV Index:   47.63 LVOT Area:     3.80 cm  RIGHT VENTRICLE RV Basal diam:  3.68 cm RV S prime:     10.40 cm/s TAPSE (M-mode): 1.8 cm LEFT ATRIUM           Index       RIGHT ATRIUM           Index LA diam:      4.20 cm 2.63 cm/m  RA Area:     13.50 cm LA Vol (A2C): 40.0 ml 25.07 ml/m RA Volume:   39.70 ml  24.88 ml/m LA Vol (A4C): 17.9 ml 11.22 ml/m  AORTIC VALVE AV Area (Vmax):    0.94 cm AV Area (Vmean):   0.87 cm AV Area (VTI):     0.96 cm AV Vmax:           152.00 cm/s AV Vmean:          122.333 cm/s AV VTI:            0.246 m AV Peak Grad:      9.2 mmHg AV Mean Grad:       6.7 mmHg LVOT Vmax:         37.45 cm/s LVOT Vmean:        27.900 cm/s LVOT VTI:          0.062 m LVOT/AV VTI ratio: 0.25  AORTA Ao Root diam: 3.60 cm MITRAL VALVE MV Area (PHT): 7.66 cm             SHUNTS MV Decel Time: 99 msec              Systemic VTI:  0.06 m MV E velocity: 58.70 cm/s 103 cm/s  Systemic Diam: 2.20 cm MV A velocity: 52.20 cm/s 70.3 cm/s MV E/A ratio:  1.12       1.5 Isaias Cowman MD Electronically signed by Isaias Cowman MD Signature Date/Time: 04/08/2019/1:00:24 PM    Final    US THORACENTESIS ASP PLEURAL SPACE W/IMG GUIDE  Result Date: 04/22/2019 INDICATION: 64 year old male with recurrent right-sided pleural effusion. EXAM: ULTRASOUND GUIDED RIGHT THORACENTESIS MEDICATIONS: None. COMPLICATIONS: None immediate. PROCEDURE: An ultrasound guided thoracentesis was thoroughly discussed with the patient and questions answered. The  benefits, risks, alternatives and complications were also discussed. The patient understands and wishes to proceed with the procedure. Written consent was obtained. Ultrasound was performed to localize and mark an adequate pocket of fluid in the right chest. The area was then prepped and draped in the normal sterile fashion. 1% Lidocaine was used for local anesthesia. Under ultrasound guidance a 6 Fr Safe-T-Centesis catheter was introduced. Thoracentesis was performed. The catheter was removed and a dressing applied. FINDINGS: A total of approximately 450 mL of yellow pleural fluid was removed. Samples were sent to the laboratory if requested by the clinical team. IMPRESSION: Successful ultrasound guided right thoracentesis yielding 450 mL of pleural fluid. Electronically Signed   By: Jacqulynn Cadet M.D.   On: 04/22/2019 17:08   US THORACENTESIS ASP PLEURAL SPACE W/IMG GUIDE  Result Date: 04/17/2019 INDICATION: Patient with history of COPD, shortness of breath, right-sided pleural effusion. Request is made for diagnostic and therapeutic right  thoracentesis. EXAM: ULTRASOUND GUIDED RIGHT THORACENTESIS MEDICATIONS: 10 mL 2% nesacaine Patient with documented allergy to lidocaine, recent tolerance of nesacaine injection 11/11/92 COMPLICATIONS: None immediate. PROCEDURE: An ultrasound guided thoracentesis was thoroughly discussed with the patient and questions answered. The benefits, risks, alternatives and complications were also discussed. The patient understands and wishes to proceed with the procedure. Written consent was obtained. Ultrasound was performed to localize and mark an adequate pocket of fluid in the right chest. The area was then prepped and draped in the normal sterile fashion. 1% Lidocaine was used for local anesthesia. Under ultrasound guidance a 6 Fr Safe-T-Centesis catheter was introduced. Thoracentesis was performed. The catheter was removed and a dressing applied. FINDINGS: A total of approximately 700 mL of yellow, clear fluid was removed. Samples were sent to the laboratory as requested by the clinical team. IMPRESSION: Successful ultrasound guided right thoracentesis yielding 700 mL of pleural fluid. Read by: Brynda Greathouse PA-C Electronically Signed   By: Aletta Edouard M.D.   On: 04/17/2019 11:26   US THORACENTESIS ASP PLEURAL SPACE W/IMG GUIDE  Addendum Date: 04/08/2019   ADDENDUM REPORT: 04/08/2019 17:38 ADDENDUM: Note, Nesacaine was utilized for subcutaneous anesthesia purposes given history of lidocaine allergy. Patient tolerated this medication without incident. Electronically Signed   By: Sandi Mariscal M.D.   On: 04/08/2019 17:38   Result Date: 04/08/2019 INDICATION: Symptomatic right-sided sided pleural effusion EXAM: US THORACENTESIS ASP PLEURAL SPACE W/IMG GUIDE COMPARISON:  Chest radiograph-04/07/2019; chest CT-04/06/2018 MEDICATIONS: None. COMPLICATIONS: None immediate. TECHNIQUE: Informed written consent was obtained from the patient after a discussion of the risks, benefits and alternatives to treatment. A timeout  was performed prior to the initiation of the procedure. Initial ultrasound scanning demonstrates a small anechoic right-sided pleural effusion. The lower chest was prepped and draped in the usual sterile fashion. 1% lidocaine was used for local anesthesia. An ultrasound image was saved for documentation purposes. An 8 Fr Safe-T-Centesis catheter was introduced under ultrasound guidance. The thoracentesis was performed. The catheter was removed and a dressing was applied. The patient tolerated the procedure well without immediate post procedural complication. The patient was escorted to have an upright chest radiograph. FINDINGS: A total of approximately 450 cc of serous fluid was removed. Requested samples were sent to the laboratory. IMPRESSION: Successful ultrasound-guided right sided thoracentesis yielding 450 cc of pleural fluid. Electronically Signed: By: Sandi Mariscal M.D. On: 04/08/2019 17:33      ASSESSMENT/PLAN   Moderate Acute exacerbation of COPD  -Continue DuoNebs every 6 hours, we will add MetaNeb with hypertonic  saline today to allow patient to expectorate thickened phlegm in preparation for discharge home.  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 -Patient has been started on Zithromax, rifampin, ethambutol -Appreciate infectious disease evaluation and recommendations -Have set up patient for afflo vest therapy to be used twice daily 20 to 30-minute sessions -Care management consultation for home PT -Pulmonary rehab on outpatient basis    Moderate right pleural  effusion   -Contributing to hypoxemia -ultrasound-guided thoracentesis -Pleural fluid studies with ADA, AFB, pH, glucose, cytology, LDH, albumin, protein triglycerides,    Moderate protein calorie malnutrition -Bitemporal wasting -Albumin 2.5 -Overall poor prognosis-will obtain palliative care consultation due to severe chronic lung disease with recurrent admissions for acute on chronic hypoxemia due to severe end-stage COPD with bullous emphysema, bronchiectasis, Mycobacterium avium complex     Thank you for allowing me to participate in the care of this patient.   Patient/Family are satisfied with care plan and all questions have been answered.  This document was prepared using Dragon voice recognition software and may include unintentional dictation errors.     FOttie Glazier M.D.  Division of [facility]

## 2019-04-22 NOTE — Progress Notes (Signed)
Daily Progress Note   Patient Name: Joseph Peters       Date: 04/22/2019 DOB: 03-26-1955  Age: 64 y.o. MRN#: [id number] Attending Physician: Wyvonnia Dusky, MD Primary Care Physician: Barbaraann Boys, MD Admit Date: 04/16/2019  Reason for Consultation/Follow-up: Establishing goals of care  Subjective: Patient sitting up in bed. Reports he has just returned from thoracentesis. He is feeling better today both physically and moodwise. He notes that last night he slept better- he has been having dreams of not being able to breath, and has also had problems with his legs needing to move constantly and pain in his legs. However, last night he did not have these problems.  I again discussed with him the chronic nature of his illness as he expressed surprise that he has "double pneumonia again". I provided him with educational materials about MAC lung disease- he was grateful for these and plans to share them with his spouse.   ROS  Length of Stay: 6  Current Medications: Scheduled Meds:  . acetylcysteine  4 mL Nebulization BID  . azithromycin  500 mg Oral Q0600  . Chlorhexidine Gluconate Cloth  6 each Topical Daily  . chloroprocaine  10 mL Infiltration Once  . chloroprocaine  20 mL Other Once  . enoxaparin (LOVENOX) injection  40 mg Subcutaneous Q24H  . ethambutol  400 mg Oral BID  . feeding supplement (ENSURE ENLIVE)  237 mL Oral TID BM  . finasteride  5 mg Oral Daily  . FLUoxetine  20 mg Oral Daily  . gabapentin  900 mg Oral QHS  . ipratropium-albuterol  3 mL Nebulization Q6H  . losartan  25 mg Oral Daily  . mouth rinse  15 mL Mouth Rinse BID  . metoprolol succinate  25 mg Oral Daily  . mirtazapine  7.5 mg Oral QHS  . montelukast  10 mg Oral QHS  . multivitamin with minerals  1  tablet Oral Daily  . rifampin  300 mg Oral BID  . roflumilast  250 mcg Oral Daily  . sodium chloride HYPERTONIC  3 mL Nebulization Q6H WA  . tamsulosin  0.4 mg Oral QPC supper    Continuous Infusions: . sodium chloride Stopped (04/16/19 1450)    PRN Meds: ipratropium-albuterol, morphine CONCENTRATE, ondansetron  Physical Exam  Vital Signs: BP 114/86 (BP Location: Left Arm)   Pulse 84   Temp 98.8 F (37.1 C) (Oral)   Resp 16   Ht _0  (1.651 m)   Wt 53.3 kg   SpO2 95%   BMI 19.55 kg/m  SpO2: SpO2: 95 % O2 Device: O2 Device: Nasal Cannula O2 Flow Rate: O2 Flow Rate (L/min): 3 L/min  Intake/output summary:   Intake/Output Summary (Last 24 hours) at 04/22/2019 1620 Last data filed at 04/22/2019 1338 Gross per 24 hour  Intake 645 ml  Output --  Net 645 ml   LBM: Last BM Date: 04/21/19 Baseline Weight: Weight: 54.3 kg Most recent weight: Weight: 53.3 kg       Palliative Assessment/Data: PPS: 60%    Flowsheet Rows     Most Recent Value  Intake Tab  Referral Department  Pulmonary  Unit at Time of Referral  Med/Surg Unit  Palliative Care Primary Diagnosis  Pulmonary  Date Notified  04/20/19  Palliative Care Type  New Palliative care  Reason for referral  Clarify Goals of Care  Date of Admission  04/16/19  Date first seen by Palliative Care  04/20/19  # of days Palliative referral response time  0 Day(s)  # of days IP prior to Palliative referral  4  Clinical Assessment  Psychosocial & Spiritual Assessment  Palliative Care Outcomes      Patient Active Problem List   Diagnosis Date Noted  . Pleural effusion on right   . Dyspnea   . Acute on chronic respiratory failure with hypoxia ([place])   . Goals of care, counseling/discussion   . Advanced care planning/counseling discussion   . Palliative care by specialist   . Restless legs syndrome (RLS) 04/01/2019  . Protein-calorie malnutrition, severe 10/15/2018  . Nodule of upper lobe of right lung  10/15/2018  . RLQ abdominal pain 10/14/2018  . Status post total hip replacement, right 07/01/2018  . Strain of right knee 06/26/2018  . Lumbar strain 06/16/2018  . Strain of right hip 06/16/2018  . Hyperlipidemia 01/30/2018  . Rotator cuff tendinitis, right 08/19/2017  . Traumatic complete tear of right rotator cuff 08/19/2017  . Ascending aortic aneurysm ([place]) 08/01/2017  . Tremor, essential 05/28/2017  . Gastroesophageal reflux disease without esophagitis 12/25/2016  . Renal artery stenosis ([place]) 12/05/2015  . 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 Syncope and collapse 12/05/2015  . Essential hypertension 10/15/2015  . COPD exacerbation ([place]) 01/31/2015  . Acute respiratory failure ([place]) 01/31/2015  . Drug allergy 01/30/2015  . AAA (abdominal aortic aneurysm) ([place]) 11/23/2014  . Prostate cancer screening 11/02/2014  . Gross hematuria 11/02/2014  . Benign hypertension 07/16/2014  . 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 Occlusion and stenosis of unspecified carotid artery 05/14/2012  . Coccydynia 02/08/2012  . 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 Gait disorder 02/08/2012  . Spinal stenosis of lumbar region 01/13/2009  . Atherosclerotic heart disease of native coronary artery without angina pectoris 06/28/2008  . Sciatica 06/28/2008  . Recurrent major depressive disorder in partial remission ([place]) 06/28/2008    Palliative Care Assessment & Plan   Patient Profile:  64 y.o. male  with past medical history of severe COPD, recent admission for MAC pnuemonia, alcoholism, anxiety, chronic pain, IBS admitted on 04/16/2019 with increasing shortness of breath. 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  Assessment/Recommendations/Plan   Restless  legs- sounds like combination of night time mirtazapine and morphine helped- will continue as  prescribed  Shortness of breath r/t chronic lung illness- continue liquid morphine prn for symptomatic relief- patient reports much relief with improvement in sleep quality with change to oral morphine  Depression, anxiety r/t chronic illness- provided education regarding illness, trajectory and also provided resources for patient support- encouraged him to look at [facility]org and [email or web address].org and participate in [employer] online support- also continue mirtazapine 7.56m qhs po  Continue outpatient Palliative followup for symptom management and goals of care  Goals of Care and Additional Recommendations:  Limitations on Scope of Treatment: Full Scope Treatment  Code Status:  Full code  Prognosis:   Unable to determine  Discharge Planning:  Home with Palliative Services  Care plan was discussed with patient.   Thank you for allowing the Palliative Medicine Team to assist in the care of this patient.   Time In: 1545 Time Out: 1625 Total Time 40 Prolonged Time Billed No      Greater than 50%  of this time was spent counseling and coordinating care related to the above assessment and plan.  KMariana Kaufman AGNP-C Palliative Medicine   Please contact Palliative Medicine Team phone at [phone] questions and concerns.

## 2019-04-22 NOTE — Progress Notes (Signed)
Nutrition Follow-up  DOCUMENTATION CODES:   Severe malnutrition in context of chronic illness  INTERVENTION:  Continue Ensure Enlive po TID, each supplement provides 350 kcal and 20 grams of protein.  Continue Magic cup TID with meals, each supplement provides 290 kcal and 9 grams of protein.  Continue daily MVI.  NUTRITION DIAGNOSIS:   Severe Malnutrition related to chronic illness(COPD) as evidenced by severe fat depletion, severe muscle depletion.  Ongoing.  GOAL:   Patient will meet greater than or equal to 90% of their needs  Met.  MONITOR:   PO intake, Supplement acceptance, Labs, Weight trends, Skin, I & O's  REASON FOR ASSESSMENT:   Consult Assessment of nutrition requirement/status  ASSESSMENT:   64 yo male with h/o COPD, etoh abuse, AAA, IBS, stroke and recently discharged 02/13 following treatment acute on chronic hypoxic respiratory failure of severe COPD and cavitary MAC rt lung who is not admitted wtih SOB, chest pain and nausea  Met with patient at bedside. He reports his appetite is still decreased but improving some. His intake has been variable. He reports he can eat 50% of some meals but 100% of others. He has been drinking 3-4 bottles of Ensure Enlive daily and also eating his [employer]. Per review of meal completion data patient has had approximately 2560 kcal (>100% estimated kcal needs) and 110 grams of protein (>100% estimated protein needs) in the past 24 hrs. Will not remove any supplementation even though patient is meeting >100% estimated needs as some studies have found patients need up to 50-60 kcal/kg to regain weight after development of severe malnutrition. Allow patient to continue to eat as desired on regular diet with oral nutrition supplements.  Medications reviewed and include: azithromycin, gabapentin, Remeron 7.5 mg QHS, MVI daily, Flomax.  Labs reviewed.  Diet Order:   Diet Order            Diet regular Room service appropriate?  Yes; Fluid consistency: Thin  Diet effective now             EDUCATION NEEDS:   Education needs have been addressed  Skin:  Skin Assessment: Reviewed RN Assessment(ecchymosis)  Last BM:  04/22/2019  Height:   Ht Readings from Last 1 Encounters:  04/16/19 _0  (1.651 m)   Weight:   Wt Readings from Last 1 Encounters:  04/16/19 53.3 kg   Ideal Body Weight:  61.8 kg  BMI:  Body mass index is 19.55 kg/m.  Estimated Nutritional Needs:   Kcal:  1700-1900kcal/day  Protein:  85-95g/day  Fluid:  >1.3L/day  Jacklynn Barnacle, MS, RD, LDN Pager number available on Amion

## 2019-04-22 NOTE — Care Management Important Message (Signed)
Important Message  Patient Details  Name: Joseph Peters MRN: [id number] Date of Birth: June 14, 1955   Medicare Important Message Given:  Yes     Kue Fox Jen Mow, RN 04/22/2019, 9:18 AM

## 2019-04-22 NOTE — Plan of Care (Signed)

## 2019-04-22 NOTE — Progress Notes (Signed)
Physical Therapy Treatment Patient Details Name: Joseph Peters MRN: [id number] DOB: 12/12/1955 Today's Date: 04/22/2019    History of Present Illness Joseph Peters is a 9yoM who [facility]. 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 PMH: CAD, COPD, hypertension, AAA, pulmonary fibrosis. Chest  CT negative for PE.    PT Comments    Pt is making good progress towards goals with increased ambulation distance without need for AD this date. Desats with increased distance on 2L of O2, takes extended time for recovery on 3L of O2, left on post session. Agreeable to sit in recliner for there-ex. Appears motivated to improve and participate. Will continue to progress as able.   Follow Up Recommendations  Home health PT     Equipment Recommendations  None recommended by PT    Recommendations for Other Services       Precautions / Restrictions Precautions Precautions: Fall Restrictions Weight Bearing Restrictions: No    Mobility  Bed Mobility Overal bed mobility: Independent             General bed mobility comments: safe technique without issue  Transfers Overall transfer level: Independent Equipment used: None Transfers: Sit to/from Stand Sit to Stand: Independent         General transfer comment: safe technique with upright posture.  Ambulation/Gait Ambulation/Gait assistance: Min guard Gait Distance (Feet): 200 Feet Assistive device: None Gait Pattern/deviations: Step-through pattern     General Gait Details: ambulated around RN station with reciprocal gait pattern and safe technique. Slow gait speed. No LOB noted. O2 sats decreased to 80% with exertion while on 2L of O2. INcreased to 3L of O2 for recovery   Stairs             Wheelchair Mobility    Modified Rankin (Stroke Patients Only)       Balance Overall balance assessment: Mild deficits observed, not formally tested                                           Cognition Arousal/Alertness: Awake/alert Behavior During Therapy: WFL for tasks assessed/performed;Anxious Overall Cognitive Status: Within Functional Limits for tasks assessed                                        Exercises Other Exercises Other Exercises: Seated ther-ex performed including B LE LAQ, alt marching, modified abdominal crunches, elbow taps, and scap squeezes for chest expansion. ALl ther-ex performed x 10 reps with sats at 91%. Rest breaks given as needed. Educated on PLB technique.    General Comments        Pertinent Vitals/Pain Pain Assessment: No/denies pain    Home Living                      Prior Function            PT Goals (current goals can now be found in the care plan section) Acute Rehab PT Goals Patient Stated Goal: to get better, go home and not come back to hospital (at least for a long time) PT Goal Formulation: With patient Time For Goal Achievement: 05/01/19 Potential to Achieve Goals: Good Progress towards PT goals: Progressing toward goals    Frequency  Min 2X/week      PT Plan Current plan remains appropriate    Co-evaluation              AM-PAC PT "6 Clicks" Mobility   Outcome Measure  Help needed turning from your back to your side while in a flat bed without using bedrails?: None Help needed moving from lying on your back to sitting on the side of a flat bed without using bedrails?: None Help needed moving to and from a bed to a chair (including a wheelchair)?: None Help needed standing up from a chair using your arms (e.g., wheelchair or bedside chair)?: None Help needed to walk in hospital room?: A Little Help needed climbing 3-5 steps with a railing? : A Little 6 Click Score: 22    End of Session Equipment Utilized During Treatment: Gait belt;Oxygen Activity Tolerance: Patient tolerated treatment well Patient left: in chair;with call bell/phone within  reach Nurse Communication: Mobility status PT Visit Diagnosis: Unsteadiness on feet (R26.81);Muscle weakness (generalized) (M62.81);History of falling (Z91.81)     Time: 4270-6237 PT Time Calculation (min) (ACUTE ONLY): 32 min  Charges:  $Gait Training: 8-22 mins $Therapeutic Exercise: 8-22 mins                     Greggory Stallion, PT, DPT [phone]    Joseph Peters 04/22/2019, 10:21 AM

## 2019-04-22 NOTE — Progress Notes (Deleted)
Bilateral mittens present to prevent patient from pulling lines and IVs.  Mittens released, pulses checked, mitts reapplied with finger space clearance between mitt and wrist present  to prevent restriction of circulation.  Patient tolerating well.

## 2019-04-22 NOTE — Progress Notes (Signed)
PROGRESS NOTE    Joseph Peters  JWJ:[id number] DOB: 10-Feb-1956 DOA: 04/16/2019 PCP: Barbaraann Boys, MD       Assessment & Plan:   Active Problems:   Acute respiratory failure (HCC)   Acute on chronic respiratory failure with hypoxia (HCC)   Goals of care, counseling/discussion   Advanced care planning/counseling discussion   Palliative care by specialist   Dyspnea   Acute on chronic hypoxic resp failure: requiring BiPAP on admission but now on 3L [place]. Likely multifactorial, secondary to MAC &severe COPD/pulmo fibrosis. Continue on bronchodilators. Continue on supplemental oxygen & wean as tolerated. Aggressive bronchopulmonary hygiene protocol. MetaNeb with hypertonic saline today to allow patient to expectorate thickened phlegm in preparation for discharge home. Completed steroid course. Encourage incentive spirometry & flutter valve. Continue Daliresp 297mg daily. TOC team working on to get him qualified for trilogy at home considering his multiple readmissions  Cavitary MAC infectionof right lung: recently admitted to hospital with increasing SOB on 04/07/19 and was discharge don 04/11/19 with home oxygen. Continue on azithromycin, rifampin & ethambutol. ID recommends to space out the meds and give azithromycin with breakfast and rifampin-ethambutol spread over 12 hrs instead of all at one time.  Has set him up for afflovest therapy to be used twice daily 20 to 30-minute sessions. ID following and recs apprec     B/lpleural effusion:R > L.S/p right pleural thoracentesis on 04/08/19 & 04/17/19. Will go for repeat right thoracentesis w/ fluid studies as per pulmon. Pulmon following and recs apprec   HAOZ:HYQMVHQIlosartan &metoprolol  HLD: continue on statin  Thoracic aortic aneurysm:CTA chest shows ascending thoracic aortic aneurysm 4.5 x 4.6 cmand also aneurysmal dilatation of descending thoracic aorta without evidence of dissection. Vascular surg previously knew about  aneurysm unable to repair until MAC infection is completely resolved as a high mortality rate w/ infection of graft as per vascular surg   Moderate protein calorie malnutrition: continue w/ nutritional supplement    DVT prophylaxis: lovenox Code Status: full  Family Communication:  Disposition Plan:    Consultants:   ID  Pulmon   Procedures:    Antimicrobials: azithromycin, rifampin & ethambutol   Subjective: Pt c/o shortness of breath  Objective: Vitals:   04/21/19 0800 04/21/19 1657 04/22/19 0002 04/22/19 0728  BP: 126/79 104/82 115/81   Pulse: 84 75 81   Resp: _0 Temp: 97.8 F (36.6 C) 98.3 F (36.8 C) 98.2 F (36.8 C)   TempSrc: Oral     SpO2: 93% 95% 95% 91%  Weight:      Height:        Intake/Output Summary (Last 24 hours) at 04/22/2019 0734 Last data filed at 04/21/2019 1700 Gross per 24 hour  Intake 555 ml  Output --  Net 555 ml   Filed Weights   04/16/19 1415 04/16/19 2200  Weight: 54.3 kg 53.3 kg    Examination:  General exam: Appears calm and comfortable  Respiratory system: course breath sounds b/l Cardiovascular system: S1 & S2 +. No  rubs, gallops or clicks. Gastrointestinal system: Abdomen is nondistended, soft and nontender. Normal bowel sounds heard. Central nervous system: Alert and oriented. Moves all 4 extremities  Psychiatry: Judgement and insight appear normal.Flat mood and affect    Data Reviewed: I have personally reviewed following labs and imaging studies  CBC: Recent Labs  Lab 04/16/19 1418 04/16/19 1418 04/18/19 0450 04/19/19 0532 04/20/19 0339 04/21/19 0430 04/22/19 0403  WBC 11.7*   < > 12.3*  10.5 10.0 9.8 9.2  NEUTROABS 9.4*  --  10.8*  --   --   --   --   HGB 14.6   < > 11.3* 11.9* 11.9* 12.2* 12.5*  HCT 44.2   < > 34.2* 36.8* 36.4* 37.1* 38.5*  MCV 89.3   < > 89.5 91.1 90.8 89.4 88.9  PLT 230   < > 190 199 204 218 251   < > = values in this interval not displayed.   Basic Metabolic  Panel: Recent Labs  Lab 04/16/19 1418 04/17/19 0519 04/18/19 0450 04/19/19 0532 04/21/19 0430  NA 133* 133* 134* 135 137  K 5.1 4.8 4.9 4.6 4.2  CL 99 103 102 101 102  CO2 25 20* _0 GLUCOSE 152* 138* 115* 93 79  BUN 13 16 29* 25* 17  CREATININE 1.20 1.04 1.21 1.00 0.90  CALCIUM 8.8* 8.2* 8.3* 8.2* 8.3*  MG  --  2.4  --   --   --   PHOS  --  4.3  --   --   --    GFR: Estimated Creatinine Clearance: 64.2 mL/min (by C-G formula based on SCr of 0.9 mg/dL). Liver Function Tests: Recent Labs  Lab 04/16/19 1418 04/17/19 0519  AST 21 14*  ALT 35 26  ALKPHOS 102 75  BILITOT 0.5 0.7  PROT 7.4 5.9*  ALBUMIN 3.3* 2.6*   No results for input(s): LIPASE, AMYLASE in the last 168 hours. No results for input(s): AMMONIA in the last 168 hours. Coagulation Profile: No results for input(s): INR, PROTIME in the last 168 hours. Cardiac Enzymes: No results for input(s): CKTOTAL, CKMB, CKMBINDEX, TROPONINI in the last 168 hours. BNP (last 3 results) No results for input(s): PROBNP in the last 8760 hours. HbA1C: No results for input(s): HGBA1C in the last 72 hours. CBG: Recent Labs  Lab 04/16/19 2209  GLUCAP 144*   Lipid Profile: No results for input(s): CHOL, HDL, LDLCALC, TRIG, CHOLHDL, LDLDIRECT in the last 72 hours. Thyroid Function Tests: No results for input(s): TSH, T4TOTAL, FREET4, T3FREE, THYROIDAB in the last 72 hours. Anemia Panel: No results for input(s): VITAMINB12, FOLATE, FERRITIN, TIBC, IRON, RETICCTPCT in the last 72 hours. Sepsis Labs: Recent Labs  Lab 04/16/19 1418  PROCALCITON <0.10    Recent Results (from the past 240 hour(s))  Blood culture (routine x 2)     Status: None   Collection Time: 04/16/19  2:18 PM   Specimen: BLOOD  Result Value Ref Range Status   Specimen Description BLOOD RIGHT ANTECUBITAL  Final   Special Requests   Final    BOTTLES DRAWN AEROBIC AND ANAEROBIC Blood Culture adequate volume   Culture   Final    NO GROWTH 5  DAYS Performed at [facility], [address] [place], [place] [address]    Report Status 04/21/2019 FINAL  Final  Respiratory Panel by RT PCR (Flu A&B, Covid) - Nasopharyngeal Swab     Status: None   Collection Time: 04/16/19  2:19 PM   Specimen: Nasopharyngeal Swab  Result Value Ref Range Status   SARS Coronavirus 2 by RT PCR NEGATIVE NEGATIVE Final    Comment: (NOTE) SARS-CoV-2 target nucleic acids are NOT DETECTED. The SARS-CoV-2 RNA is generally detectable in upper respiratoy specimens during the acute phase of infection. The lowest concentration of SARS-CoV-2 viral copies this assay can detect is 131 copies/mL. A negative result does not preclude SARS-Cov-2 infection and should not be used as the sole basis  for treatment or other patient management decisions. A negative result may occur with  improper specimen collection/handling, submission of specimen other than nasopharyngeal swab, presence of viral mutation(s) within the areas targeted by this assay, and inadequate number of viral copies (<131 copies/mL). A negative result must be combined with clinical observations, patient history, and epidemiological information. The expected result is Negative. Fact Sheet for Patients:  PinkCheek.be Fact Sheet for Healthcare Providers:  GravelBags.it This test is not yet ap proved or cleared by the [place] FDA and  has been authorized for detection and/or diagnosis of SARS-CoV-2 by FDA under an Emergency Use Authorization (EUA). This EUA will remain  in effect (meaning this test can be used) for the duration of the COVID-19 declaration under Section 564(b)(1) of the Act, 21 U.S.C. section 360bbb-3(b)(1), unless the authorization is terminated or revoked sooner.    Influenza A by PCR NEGATIVE NEGATIVE Final   Influenza B by PCR NEGATIVE NEGATIVE Final    Comment: (NOTE) The Xpert Xpress SARS-CoV-2/FLU/RSV  assay is intended as an aid in  the diagnosis of influenza from Nasopharyngeal swab specimens and  should not be used as a sole basis for treatment. Nasal washings and  aspirates are unacceptable for Xpert Xpress SARS-CoV-2/FLU/RSV  testing. Fact Sheet for Patients: PinkCheek.be Fact Sheet for Healthcare Providers: GravelBags.it This test is not yet approved or cleared by the [place] FDA and  has been authorized for detection and/or diagnosis of SARS-CoV-2 by  FDA under an Emergency Use Authorization (EUA). This EUA will remain  in effect (meaning this test can be used) for the duration of the  Covid-19 declaration under Section 564(b)(1) of the Act, 21  U.S.C. section 360bbb-3(b)(1), unless the authorization is  terminated or revoked. Performed at [facility], [address] [place], [place] [address]   Blood culture (routine x 2)     Status: None   Collection Time: 04/16/19  2:28 PM   Specimen: BLOOD  Result Value Ref Range Status   Specimen Description BLOOD BLOOD RIGHT WRIST  Final   Special Requests   Final    BOTTLES DRAWN AEROBIC AND ANAEROBIC Blood Culture adequate volume   Culture   Final    NO GROWTH 5 DAYS Performed at [facility], [address] [place],  [address]    Report Status 04/21/2019 FINAL  Final  MRSA PCR Screening     Status: None   Collection Time: 04/16/19 10:12 PM   Specimen: Nasopharyngeal  Result Value Ref Range Status   MRSA by PCR NEGATIVE NEGATIVE Final    Comment:        The GeneXpert MRSA Assay (FDA approved for NASAL specimens only), is one component of a comprehensive MRSA colonization surveillance program. It is not intended to diagnose MRSA infection nor to guide or monitor treatment for MRSA infections. Performed at [facility], [address] [place],  [address]          Radiology Studies: DG Chest Right  Decubitus  Result Date: 04/21/2019 CLINICAL DATA:  Dyspnea, recent right-sided thoracentesis EXAM: CHEST - RIGHT DECUBITUS; CHEST - LEFT DECUBITUS COMPARISON:  CT 04/16/2019 FINDINGS: Bilateral decubitus views of the chest were obtained. Small to moderate right-sided pleural effusion with small loculated component at the right upper lobe. Trace layering left-sided pleural effusion without loculated component. There is no pneumothorax. Chronic consolidation in the right upper lobe. Fibrotic changes throughout both lungs. Stable heart size. IMPRESSION: 1. Negative for pneumothorax. 2. Small to  moderate right-sided pleural effusion with small loculated component at the right upper lobe. 3. Trace layering left-sided pleural effusion without loculated component. Electronically Signed   By: Davina Poke D.O.   On: 04/21/2019 11:17   DG Chest Left Decubitus  Result Date: 04/21/2019 CLINICAL DATA:  Dyspnea, recent right-sided thoracentesis EXAM: CHEST - RIGHT DECUBITUS; CHEST - LEFT DECUBITUS COMPARISON:  CT 04/16/2019 FINDINGS: Bilateral decubitus views of the chest were obtained. Small to moderate right-sided pleural effusion with small loculated component at the right upper lobe. Trace layering left-sided pleural effusion without loculated component. There is no pneumothorax. Chronic consolidation in the right upper lobe. Fibrotic changes throughout both lungs. Stable heart size. IMPRESSION: 1. Negative for pneumothorax. 2. Small to moderate right-sided pleural effusion with small loculated component at the right upper lobe. 3. Trace layering left-sided pleural effusion without loculated component. Electronically Signed   By: Davina Poke D.O.   On: 04/21/2019 11:17        Scheduled Meds: . acetylcysteine  4 mL Nebulization BID  . azithromycin  500 mg Oral Q0600  . Chlorhexidine Gluconate Cloth  6 each Topical Daily  . chloroprocaine  10 mL Infiltration Once  . enoxaparin (LOVENOX) injection  40  mg Subcutaneous Q24H  . ethambutol  400 mg Oral BID  . feeding supplement (ENSURE ENLIVE)  237 mL Oral TID BM  . finasteride  5 mg Oral Daily  . FLUoxetine  20 mg Oral Daily  . gabapentin  900 mg Oral QHS  . ipratropium-albuterol  3 mL Nebulization Q6H  . losartan  25 mg Oral Daily  . mouth rinse  15 mL Mouth Rinse BID  . metoprolol succinate  25 mg Oral Daily  . mirtazapine  7.5 mg Oral QHS  . montelukast  10 mg Oral QHS  . multivitamin with minerals  1 tablet Oral Daily  . rifampin  300 mg Oral BID  . roflumilast  250 mcg Oral Daily  . sodium chloride HYPERTONIC  3 mL Nebulization Q6H WA  . tamsulosin  0.4 mg Oral QPC supper   Continuous Infusions: . sodium chloride Stopped (04/16/19 1450)     LOS: 6 days    Time spent: 33 mins     Wyvonnia Dusky, MD [facility] Pager [phone]  If 7PM-7AM, please contact night-coverage [email or web address] 04/22/2019, 7:34 AM

## 2019-04-23 DIAGNOSIS — Z8619 Personal history of other infectious and parasitic diseases: Secondary | ICD-10-CM

## 2019-04-23 LAB — BASIC METABOLIC PANEL
Anion gap: 9 (ref 5–15)
BUN: 18 mg/dL (ref 8–23)
CO2: 25 mmol/L (ref 22–32)
Calcium: 8.2 mg/dL — ABNORMAL LOW (ref 8.9–10.3)
Chloride: 102 mmol/L (ref 98–111)
Creatinine, Ser: 0.92 mg/dL (ref 0.61–1.24)
GFR calc Af Amer: >60 mL/min (ref >60)
GFR calc non Af Amer: >60 mL/min (ref >60)
Glucose, Bld: 78 mg/dL (ref 70–99)
Potassium: 4.1 mmol/L (ref 3.5–5.1)
Sodium: 136 mmol/L (ref 135–145)

## 2019-04-23 LAB — CBC
HCT: 37.7 % — ABNORMAL LOW (ref 39.0–52.0)
Hemoglobin: 12.5 g/dL — ABNORMAL LOW (ref 13.0–17.0)
MCH: 29.6 pg (ref 26.0–34.0)
MCHC: 33.2 g/dL (ref 30.0–36.0)
MCV: 89.3 fL (ref 80.0–100.0)
Platelets: 249 10*3/uL (ref 150–400)
RBC: 4.22 MIL/uL (ref 4.22–5.81)
RDW: 14.6 % (ref 11.5–15.5)
WBC: 8.9 10*3/uL (ref 4.0–10.5)
nRBC: 0 % (ref 0.0–0.2)

## 2019-04-23 LAB — PH, BODY FLUID: pH, Body Fluid: 7.8

## 2019-04-23 LAB — CYTOLOGY - NON PAP

## 2019-04-23 LAB — PROTEIN, BODY FLUID (OTHER): Total Protein, Body Fluid Other: 1.8 g/dL

## 2019-04-23 MED ORDER — MORPHINE SULFATE (CONCENTRATE) 10 MG/0.5ML PO SOLN: 10.0000 mg | Freq: Three times a day (TID) | ORAL | 0 refills | Status: DC | PRN

## 2019-04-23 MED ORDER — MIRTAZAPINE 7.5 MG PO TABS: 7.5000 mg | ORAL_TABLET | Freq: Every day | ORAL | 0 refills | Status: AC

## 2019-04-23 NOTE — Discharge Summary (Signed)
Physician Discharge Summary  Joseph Peters JKD:[id number] DOB: [birth year] DOA: 04/16/2019  PCP: Barbaraann Boys, MD  Admit date: 04/16/2019 Discharge date: 04/23/2019  Admitted From: home Disposition: home w/ home health   Recommendations for Outpatient Follow-up:  1. Follow up with PCP in 1-2 weeks 2. F/U pulmon in 1-2 weeks 3. F/u ID in 1-2 weeks  Home Health: yes Equipment/Devices: trilogy at home  Discharge Condition: stable CODE STATUS:full  Diet recommendation: Heart Healthy  Brief/Interim Summary: HPI was taken from Dr. Carlynn Spry: Joseph Peters  is a 64 y.o. male with a known history of severe COPD, MAC being admitted for worsening shortness of breath. Patient has not felt well since last discharge. He has been trying to take his medications but has had difficulty tolerating due to constant coughing f/by vomiting. Hereports pleuritic chest pain associated with worsening shortness of breath. Per EMS report, they were called out to the scene due to shortness of breath. Was found in his house, severely tachypneic in the 50s to 60s. He was placed on BiPAP with significant improvement. He states he does feel somewhat better since starting BiPAP. He does not recall any fevers.  Patient's O2 saturation on 2 L dropped as low as 84%. Maintaining 98 to 99% saturations on BiPAP.  Hospital Course from Dr. Lenise Herald 2/24-2/25/21: Pt was initially found to have acute on chronic hypoxic respiratory failure requiring BiPAP. Pt has since been weaned off of BIPAP and back down to 3L [place]. Pt was continued on azithromycin, rifampin & ethambutol but taking these medications spaced out. Of note, pt was sent home an afflo vest therapy. Home health will continue to see the pt outpatient as well. Of note, pt had another right thoracentesis done. Fluid studies were pending at time of d/c.   Discharge Diagnoses:  Active Problems:   Acute respiratory failure (HCC)   Acute on chronic respiratory  failure with hypoxia (HCC)   Goals of care, counseling/discussion   Advanced care planning/counseling discussion   Palliative care by specialist   Dyspnea   Pleural effusion on right   SOB (shortness of breath)   Anxiety about health  Acute on chronic hypoxic resp failure: requiring BiPAP on admission but now on 3L [place]. Likely multifactorial, secondary to MAC &severe COPD/pulmo fibrosis. Continue on bronchodilators. Continue on supplemental oxygen & wean as tolerated. Aggressive bronchopulmonary hygiene protocol. MetaNeb with hypertonic saline today to allow patient to expectorate thickened phlegm in preparation for discharge home. Completed steroid course. Encourage incentive spirometry & flutter valve. Continue Daliresp 212mg daily. TOC team working on to get him qualified for trilogy at home considering his multiple readmissions  Cavitary MAC infectionof right lung: recently admitted to hospital with increasing SOB on 04/07/19 and was discharge don 04/11/19 with home oxygen. Continue on azithromycin, rifampin & ethambutol. ID recommends to space out the meds and give azithromycin with breakfast and rifampin-ethambutol spread over 12 hrs instead of all at one time. Afflovest therapy to be used twice daily 20 to 30-minute sessions. ID following and recs apprec     B/lpleural effusion:R > L.S/p right pleural thoracentesis on 04/08/19 & 04/17/19. Will go for repeat right thoracentesis w/ fluid studies as per pulmon. Pulmon following and recs apprec   HKDX:IPJASNKNlosartan &metoprolol  HLD: continue on statin  Thoracic aortic aneurysm:CTA chest shows ascending thoracic aortic aneurysm 4.5 x 4.6 cmand also aneurysmal dilatation of descending thoracic aorta without evidence of dissection. Vascular surg previously knew about aneurysm unable to repair until MAC  infection is completely resolved as a high mortality rate w/ infection of graft as per vascular surg   Moderate protein calorie  malnutrition: continue w/ nutritional supplement   Discharge Instructions  Discharge Instructions    Diet - low sodium heart healthy   Complete by: As directed    Discharge instructions   Complete by: As directed    F/u PCP in 1-2 weeks; F/u ID in 1-2 weeks; F/u ID in 1-2 weeks   Increase activity slowly   Complete by: As directed      Allergies as of 04/23/2019      Reactions   Penicillins Swelling, Rash, Other (See Comments)   Swelling in throat Did it involve swelling of the face/tongue/throat, SOB, or low BP? yes Did it involve sudden or severe rash/hives, skin peeling, or any reaction on the inside of your mouth or nose? no Did you need to seek medical attention at a hospital or doctor's office? Yes When did it last happen?years  If all above answers are "NO", may proceed with cephalosporin use.   Lidocaine Other (See Comments)   Unsure  At dentist's office      Medication List    STOP taking these medications   benzonatate 200 MG capsule Commonly known as: TESSALON   predniSONE 20 MG tablet Commonly known as: Deltasone     TAKE these medications   albuterol 108 (90 Base) MCG/ACT inhaler Commonly known as: VENTOLIN HFA Inhale 2 puffs into the lungs every 4 (four) hours as needed for wheezing or shortness of breath.   azithromycin 500 MG tablet Commonly known as: Zithromax Take 1 tablet (500 mg total) by mouth daily.   Daliresp 250 MCG Tabs Generic drug: Roflumilast Take 250 mcg by mouth at bedtime.   ethambutol 400 MG tablet Commonly known as: MYAMBUTOL Take 2 tablets (800 mg total) by mouth daily.   finasteride 5 MG tablet Commonly known as: PROSCAR Take 1 tablet (5 mg total) by mouth daily.   FLUoxetine 20 MG capsule Commonly known as: PROZAC Take 20 mg by mouth daily.   gabapentin 300 MG capsule Commonly known as: NEURONTIN Take 900 mg by mouth at bedtime.   losartan 25 MG tablet Commonly known as: COZAAR Take 25 mg by mouth daily.    metoprolol succinate 25 MG 24 hr tablet Commonly known as: TOPROL-XL Take 1 tablet (25 mg total) by mouth daily.   mirtazapine 7.5 MG tablet Commonly known as: REMERON Take 1 tablet (7.5 mg total) by mouth at bedtime.   montelukast 10 MG tablet Commonly known as: SINGULAIR Take 10 mg by mouth at bedtime.   morphine CONCENTRATE 10 MG/0.5ML Soln concentrated solution Take 0.5 mLs (10 mg total) by mouth every 8 (eight) hours as needed for up to 5 days for severe pain or shortness of breath.   omeprazole 20 MG capsule Commonly known as: PRILOSEC Take 20 mg by mouth daily.   ondansetron 4 MG disintegrating tablet Commonly known as: Zofran ODT Take 1 tablet (4 mg total) by mouth every 8 (eight) hours as needed for nausea or vomiting.   Oxycodone HCl 10 MG Tabs Take 10 mg by mouth 4 (four) times daily as needed for pain.   rifampin 300 MG capsule Commonly known as: RIFADIN Take 2 capsules (600 mg total) by mouth daily.   rosuvastatin 5 MG tablet Commonly known as: CRESTOR Take 5 mg by mouth at bedtime.   tamsulosin 0.4 MG Caps capsule Commonly known as: FLOMAX Take 1 capsule (  0.4 mg total) by mouth daily after supper.   Trelegy Ellipta 100-62.5-25 MCG/INH Aepb Generic drug: Fluticasone-Umeclidin-Vilant Inhale 2 puffs into the lungs at bedtime.       Allergies  Allergen Reactions  . Penicillins Swelling, Rash and Other (See Comments)    Swelling in throat  Did it involve swelling of the face/tongue/throat, SOB, or low BP? yes Did it involve sudden or severe rash/hives, skin peeling, or any reaction on the inside of your mouth or nose? no Did you need to seek medical attention at a hospital or doctor's office? Yes When did it last happen?years  If all above answers are "NO", may proceed with cephalosporin use.    . Lidocaine Other (See Comments)    Unsure  At dentist's office    Consultations:  ID  Pulmon  Palliative care   Procedures/Studies: DG  Chest 2 View  Result Date: 04/07/2019 CLINICAL DATA:  Chest pain and shortness of breath EXAM: CHEST - 2 VIEW COMPARISON:  March 13, 2019 chest radiograph and chest CT February 13, 2019 FINDINGS: There remains consolidation with volume loss in the right upper lobe with slightly improved aeration in this area compared to prior studies. Elsewhere, there is extensive underlying emphysematous change with fibrosis. Fibrosis is most severe in the right base region. No new opacity evident. Heart size and pulmonary vascularity are normal. No adenopathy is evident. There is aortic atherosclerosis. No bone lesions. There is calcification in each carotid artery. IMPRESSION: Underlying emphysematous change in fibrosis. Consolidation in the right upper lobe is slightly less pronounced than on recent prior studies. No new opacity evident. Stable cardiac silhouette. No adenopathy demonstrable by radiography. Aortic Atherosclerosis (ICD10-I70.0). Electronically Signed   By: Lowella Grip III M.D.   On: 04/07/2019 11:20   CT Angio Chest PE W and/or Wo Contrast  Result Date: 04/16/2019 CLINICAL DATA:  Shortness of breath and hypoxia EXAM: CT ANGIOGRAPHY CHEST WITH CONTRAST TECHNIQUE: Multidetector CT imaging of the chest was performed using the standard protocol during bolus administration of intravenous contrast. Multiplanar CT image reconstructions and MIPs were obtained to evaluate the vascular anatomy. CONTRAST:  8m OMNIPAQUE IOHEXOL 350 MG/ML SOLN COMPARISON:  04/07/2019. FINDINGS: Cardiovascular: Atheromatous calcifications, including the coronary arteries and aorta. Normally opacified pulmonary arteries with no pulmonary arterial filling defects seen. The ascending thoracic aorta measures 4.5 cm in maximum diameter, previously 4.6 cm. Aneurysmal dilatation of the descending thoracic aorta with a maximum diameter of 3.5 cm, previously 3.8 cm. Aneurysmal dilatation of the infrarenal abdominal aorta with a maximum  diameter of 4.0 cm on the included portions. Mediastinum/Nodes: Mildly enlarged right hilar lymph nodes with little change. The largest measures 1.1 cm in short axis diameter on image number 55 series 4. Thyroid gland, trachea, and esophagus demonstrate no significant findings. Lungs/Pleura: Stable marked bilateral centrilobular bullous changes. Previously demonstrated right upper lobe consolidation with air bronchograms and focal distal bronchial dilatation without significant change. Small amount of consolidation in the posterior left lower lobe with little change. Moderate-sized right pleural effusion and small to moderate-sized left pleural effusion, both increased. Upper Abdomen: Dilated in for abdominal aorta as described above, not included in its entirety. Musculoskeletal: Stable 20% inferior endplate compression deformity of a midthoracic vertebral body. Review of the MIP images confirms the above findings. IMPRESSION: 1. No pulmonary emboli. 2. Stable right upper lobe consolidation with air bronchograms and focal distal bronchial dilatation. 3. Stable small amount of consolidation in the posterior left lower lobe. 4. Stable mild right hilar  adenopathy, most likely reactive. 5. Stable marked changes of COPD. 6. Stable aneurysmal dilatation of the ascending thoracic aorta and descending thoracic aorta with interval included aneurysmal dilatation of the infrarenal abdominal aorta, not included in its entirety, measuring 4.0 cm. 7. Moderate-sized right pleural effusion and small to moderate-sized left pleural effusion, both increased. 8. Calcific coronary artery and aortic atherosclerosis. Aortic Atherosclerosis (ICD10-I70.0) and Emphysema (ICD10-J43.9). Electronically Signed   By: Claudie Revering M.D.   On: 04/16/2019 16:28   CT Angio Chest PE W/Cm &/Or Wo Cm  Result Date: 04/07/2019 CLINICAL DATA:  Shortness of breath. EXAM: CT ANGIOGRAPHY CHEST WITH CONTRAST TECHNIQUE: Multidetector CT imaging of the chest  was performed using the standard protocol during bolus administration of intravenous contrast. Multiplanar CT image reconstructions and MIPs were obtained to evaluate the vascular anatomy. CONTRAST:  66m OMNIPAQUE IOHEXOL 350 MG/ML SOLN COMPARISON:  Chest radiograph April 07, 2019; chest CT February 13, 2019 FINDINGS: Cardiovascular: There is no demonstrable pulmonary embolus. Ascending thoracic aortic diameter measures 4.6 x 4.5 cm. No dissection is appreciable. The contrast bolus in the aorta is somewhat less than optimal for dissection assessment. There is localized aneurysmal dilatation in the distal descending thoracic aorta at the diaphragmatic hiatus with localized plaque and calcification along the leftward aspect of this aneurysm. The descending thoracic aorta at the level of the diaphragmatic hiatus measures 3.8 x 3.1 cm. Appearance of this aneurysm is similar to most recent CT. There is aortic atherosclerosis as well as scattered foci of calcification in proximal visualized great vessels. There are foci of coronary artery calcification. There is left ventricular hypertrophy. No pericardial effusion or pericardial thickening. Mediastinum/Nodes: Thyroid appears unremarkable. There are mildly prominent right hilar lymph nodes, largest measuring 1.5 x 1.3 cm. Subcarinal lymph node measures 1.4 x 1.2 cm. No esophageal lesions are evident. Lungs/Pleura: There is a moderate right pleural effusion with a much smaller left pleural effusion. There is extensive underlying emphysematous change with fibrosis in the lower lung regions, significantly more on the right than on the left. There are multiple areas of cicatrization throughout the lungs bilaterally, more severe on the right than on the left. There is consolidation in the anterior segment right upper lobe with air bronchograms and volume loss. No similar consolidation elsewhere. Upper Abdomen: There is upper abdominal aortic atherosclerosis. Visualized  upper abdominal structures otherwise appear unremarkable. Musculoskeletal: Anterior wedging of a midthoracic vertebral body is stable. No blastic or lytic bone lesions. No evident chest wall lesions. Review of the MIP images confirms the above findings. IMPRESSION: 1.  No demonstrable pulmonary embolus. 2. Prominence of the ascending thoracic aorta with a measured diameter of 4.6 x 4.5 cm. Ascending thoracic aortic aneurysm. Recommend semi-annual imaging followup by CTA or MRA and referral to cardiothoracic surgery if not already obtained. This recommendation follows 2010 ACCF/AHA/AATS/ACR/ASA/SCA/SCAI/SIR/STS/SVM Guidelines for the Diagnosis and Management of Patients With Thoracic Aortic Disease. Circulation. 2010; 121:: [id number] Aortic aneurysm NOS (ICD10-I71.9). No dissection evident with contrast bolus somewhat less than optimal for dissection assessment. 3. Localized aneurysmal dilatation of the descending thoracic aorta at the diaphragmatic hiatus level measuring 3.8 x 3.1 cm. Localized plaque and dilatation along the leftward aspect of the aneurysm, also present previously. Note that there is aortic atherosclerosis as well as foci of great vessel and coronary artery calcification. 4. 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 Pleural effusions bilaterally, larger on the right than on the left. Persistent consolidation in the right upper  lobe anteriorly with air bronchograms. Appearance most consistent with pneumonia with possibility of superimposed neoplasm not excluded anteriorly toward the apex on the right. 5.  Prominent right hilar and subcarinal lymph nodes. Aortic Atherosclerosis (ICD10-I70.0) and Emphysema (ICD10-J43.9). Aortic aneurysm NOS (ICD10-I71.9). Electronically Signed   By: Lowella Grip III M.D.   On: 04/07/2019 13:17   DG Chest Right Decubitus  Result Date: 04/21/2019 CLINICAL DATA:  Dyspnea, recent right-sided thoracentesis EXAM: CHEST -  RIGHT DECUBITUS; CHEST - LEFT DECUBITUS COMPARISON:  CT 04/16/2019 FINDINGS: Bilateral decubitus views of the chest were obtained. Small to moderate right-sided pleural effusion with small loculated component at the right upper lobe. Trace layering left-sided pleural effusion without loculated component. There is no pneumothorax. Chronic consolidation in the right upper lobe. Fibrotic changes throughout both lungs. Stable heart size. IMPRESSION: 1. Negative for pneumothorax. 2. Small to moderate right-sided pleural effusion with small loculated component at the right upper lobe. 3. Trace layering left-sided pleural effusion without loculated component. Electronically Signed   By: Davina Poke D.O.   On: 04/21/2019 11:17   DG Chest Left Decubitus  Result Date: 04/21/2019 CLINICAL DATA:  Dyspnea, recent right-sided thoracentesis EXAM: CHEST - RIGHT DECUBITUS; CHEST - LEFT DECUBITUS COMPARISON:  CT 04/16/2019 FINDINGS: Bilateral decubitus views of the chest were obtained. Small to moderate right-sided pleural effusion with small loculated component at the right upper lobe. Trace layering left-sided pleural effusion without loculated component. There is no pneumothorax. Chronic consolidation in the right upper lobe. Fibrotic changes throughout both lungs. Stable heart size. IMPRESSION: 1. Negative for pneumothorax. 2. Small to moderate right-sided pleural effusion with small loculated component at the right upper lobe. 3. Trace layering left-sided pleural effusion without loculated component. Electronically Signed   By: Davina Poke D.O.   On: 04/21/2019 11:17   DG Chest Port 1 View  Result Date: 04/22/2019 CLINICAL DATA:  Right-sided pleural effusion EXAM: PORTABLE CHEST 1 VIEW COMPARISON:  04/17/2019 FINDINGS: Stable cardiomediastinal contours. Chronic consolidation within the right upper lobe. Fibrotic changes throughout both lungs. No significant pleural fluid collection is seen on frontal view. No  pneumothorax IMPRESSION: No pneumothorax status post thoracentesis. Electronically Signed   By: Davina Poke D.O.   On: 04/22/2019 13:06   DG Chest Port 1 View  Result Date: 04/17/2019 CLINICAL DATA:  Right-sided thoracentesis EXAM: PORTABLE CHEST 1 VIEW COMPARISON:  Yesterday FINDINGS: Interstitial and airspace opacity in the right upper lobe with volume loss. Normal heart size and stable mediastinal contours. No visible pneumothorax. Trace left pleural effusion. IMPRESSION: 1. No complicating features after thoracentesis. 2. Emphysema and stable right upper lobe opacity with volume loss. Electronically Signed   By: Monte Fantasia M.D.   On: 04/17/2019 11:17   DG Chest Portable 1 View  Result Date: 04/16/2019 CLINICAL DATA:  Shortness of breath EXAM: PORTABLE CHEST 1 VIEW COMPARISON:  April 08, 2019 chest radiograph and chest CT Jul 05 2019 FINDINGS: Extensive fibrosis is noted throughout the lungs bilaterally. There may be a degree of superimposed interstitial edema given increase in interstitial thickening compared to recent CT examination. There is equivocal increase in interstitial thickening bilaterally compared to most recent chest radiograph. There may be patchy airspace opacity intermingled with interstitial prominence. There remains extensive consolidation in the right upper lobe. Heart size and pulmonary vascularity are stable and within normal limits given the underlying fibrosis. No adenopathy is appreciable by radiography. No bone lesions. IMPRESSION: Persistent extensive fibrosis with suspected superimposed interstitial edema and possible interspersed airspace opacity.  The degree of ill-defined opacity, primarily in the left lower lobe is modestly increased compared to most recent chest radiograph and is definitely increased compared to most recent CT. Consolidation remains in the right upper lobe. Stable cardiac silhouette. Electronically Signed   By: Lowella Grip III M.D.   On:  04/16/2019 14:43   DG Chest Port 1 View  Result Date: 04/08/2019 CLINICAL DATA:  Post right-sided thoracentesis. EXAM: PORTABLE CHEST 1 VIEW COMPARISON:  04/07/2019; chest CT-04/07/2019 FINDINGS: Grossly unchanged cardiac silhouette and mediastinal contours with persistent obscuration of the right heart border and mediastinal stripe secondary to worsening right upper lobe heterogeneous opacities and associated volume loss. Worsening right mid and bilateral lower lung interstitial thickening. Interval reduction/resolution of right-sided pleural effusion post thoracentesis. No pneumothorax. No acute osseous abnormalities. IMPRESSION: 1. Interval reduction/resolution of right-sided pleural effusion post thoracentesis. No pneumothorax. 2. Worsening right heterogeneous/consolidative opacities and associated volume loss. 3. Worsening right mid bilateral lower lung interstitial thickening with broad differential considerations including pulmonary edema versus progression of multifocal infection, including atypical etiologies. Electronically Signed   By: Sandi Mariscal M.D.   On: 04/08/2019 17:31   ECHOCARDIOGRAM COMPLETE  Result Date: 04/08/2019    ECHOCARDIOGRAM REPORT   Patient Name:   CHAZ MCGLASSON Date of Exam: 04/07/2019 Medical Rec #:  [id number]        Height:       65.0 in Accession #:    [id number]       Weight:       120.6 lb Date of Birth:  1955/07/04        BSA:          1.60 m Patient Age:    35 years         BP:           121/92 mmHg Patient Gender: M                HR:           96 bpm. Exam Location:  [facility] Procedure: 2D Echo, Cardiac Doppler and Color Doppler Indications:     Elevated Troponin  History:         Patient has no prior history of Echocardiogram examinations.                  Risk Factors:Dyslipidemia and Hypertension. Stroke. Pneumonia.                  Myocardial Infarction. Dyspnea. Coronary artery disease. COPD.                  Chest pain. AAA.  Sonographer:     Wilford Sports Rodgers-Jones  Referring Phys:  [place] Diagnosing Phys: Isaias Cowman MD IMPRESSIONS  1. Left ventricular ejection fraction, by estimation, is 35 to 40%. The left ventricle has moderately decreased function. The left ventrical has no regional wall motion abnormalities. The left ventricular internal cavity size was mildly to moderately dilated. Left ventricular diastolic parameters are indeterminate.  2. Right ventricular systolic function is normal. The right ventricular size is normal.  3. No left atrial/left atrial appendage thrombus was detected.  4. The mitral valve is normal in structure and function. Mild mitral valve regurgitation. No evidence of mitral stenosis.  5. The aortic valve is normal in structure and function. Aortic valve regurgitation is not visualized. Mild to moderate aortic valve stenosis.  6. The inferior vena cava is normal in size with greater than 50% respiratory variability, suggesting  right atrial pressure of 3 mmHg. FINDINGS  Left Ventricle: Left ventricular ejection fraction, by estimation, is 35 to 40%. The left ventricle has moderately decreased function. The left ventricle has no regional wall motion abnormalities. The left ventricular internal cavity size was mildly to moderately dilated. There is no. There is no left ventricular hypertrophy. Left ventricular diastolic parameters are indeterminate.  LV Wall Scoring: The basal inferolateral segment, mid inferoseptal segment, apical septal segment, basal inferior segment, and apex are hypokinetic. Right Ventricle: The right ventricular size is normal. No increase in right ventricular wall thickness. Right ventricular systolic function is normal. Left Atrium: Left atrial size was normal in size. Right Atrium: Right atrial size was normal in size. Pericardium: There is no evidence of pericardial effusion. Mitral Valve: The mitral valve is normal in structure and function. Normal mobility of the mitral valve leaflets. Mild  mitral valve regurgitation. No evidence of mitral valve stenosis. Tricuspid Valve: The tricuspid valve is normal in structure. Tricuspid valve regurgitation is mild . No evidence of tricuspid stenosis. Aortic Valve: The aortic valve is normal in structure and function. Aortic valve regurgitation is not visualized. Mild to moderate aortic stenosis is present. Aortic valve mean gradient measures 6.7 mmHg. Aortic valve peak gradient measures 9.2 mmHg. Aortic valve area, by VTI measures 0.96 cm. Pulmonic Valve: The pulmonic valve was normal in structure. Pulmonic valve regurgitation is not visualized. No evidence of pulmonic stenosis. Aorta: The aortic root is normal in size and structure. Venous: The inferior vena cava is normal in size with greater than 50% respiratory variability, suggesting right atrial pressure of 3 mmHg. The inferior vena cava and the hepatic vein show a normal flow pattern. IAS/Shunts: No atrial level shunt detected by color flow Doppler.  LEFT VENTRICLE PLAX 2D LVIDd:         5.61 cm  Diastology LVIDs:         4.21 cm  LV e' lateral:   4.35 cm/s LV PW:         0.94 cm  LV E/e' lateral: 13.5 LV IVS:        0.68 cm  LV e' medial:    5.00 cm/s LVOT diam:     2.20 cm  LV E/e' medial:  11.7 LV SV:         23.64 ml LV SV Index:   47.63 LVOT Area:     3.80 cm  RIGHT VENTRICLE RV Basal diam:  3.68 cm RV S prime:     10.40 cm/s TAPSE (M-mode): 1.8 cm LEFT ATRIUM           Index       RIGHT ATRIUM           Index LA diam:      4.20 cm 2.63 cm/m  RA Area:     13.50 cm LA Vol (A2C): 40.0 ml 25.07 ml/m RA Volume:   39.70 ml  24.88 ml/m LA Vol (A4C): 17.9 ml 11.22 ml/m  AORTIC VALVE AV Area (Vmax):    0.94 cm AV Area (Vmean):   0.87 cm AV Area (VTI):     0.96 cm AV Vmax:           152.00 cm/s AV Vmean:          122.333 cm/s AV VTI:            0.246 m AV Peak Grad:      9.2 mmHg AV Mean Grad:      6.7 mmHg  LVOT Vmax:         37.45 cm/s LVOT Vmean:        27.900 cm/s LVOT VTI:          0.062 m LVOT/AV  VTI ratio: 0.25  AORTA Ao Root diam: 3.60 cm MITRAL VALVE MV Area (PHT): 7.66 cm             SHUNTS MV Decel Time: 99 msec              Systemic VTI:  0.06 m MV E velocity: 58.70 cm/s 103 cm/s  Systemic Diam: 2.20 cm MV A velocity: 52.20 cm/s 70.3 cm/s MV E/A ratio:  1.12       1.5 Isaias Cowman MD Electronically signed by Isaias Cowman MD Signature Date/Time: 04/08/2019/1:00:24 PM    Final    Pulmonary Function Test [facility] Only  Result Date: 04/23/2019 I DO NOT SEE PFT's for this encounter  US THORACENTESIS ASP PLEURAL SPACE W/IMG GUIDE  Result Date: 04/22/2019 INDICATION: 64 year old male with recurrent right-sided pleural effusion. EXAM: ULTRASOUND GUIDED RIGHT THORACENTESIS MEDICATIONS: None. COMPLICATIONS: None immediate. PROCEDURE: An ultrasound guided thoracentesis was thoroughly discussed with the patient and questions answered. The benefits, risks, alternatives and complications were also discussed. The patient understands and wishes to proceed with the procedure. Written consent was obtained. Ultrasound was performed to localize and mark an adequate pocket of fluid in the right chest. The area was then prepped and draped in the normal sterile fashion. 1% Lidocaine was used for local anesthesia. Under ultrasound guidance a 6 Fr Safe-T-Centesis catheter was introduced. Thoracentesis was performed. The catheter was removed and a dressing applied. FINDINGS: A total of approximately 450 mL of yellow pleural fluid was removed. Samples were sent to the laboratory if requested by the clinical team. IMPRESSION: Successful ultrasound guided right thoracentesis yielding 450 mL of pleural fluid. Electronically Signed   By: Jacqulynn Cadet M.D.   On: 04/22/2019 17:08   US THORACENTESIS ASP PLEURAL SPACE W/IMG GUIDE  Result Date: 04/17/2019 INDICATION: Patient with history of COPD, shortness of breath, right-sided pleural effusion. Request is made for diagnostic and therapeutic right  thoracentesis. EXAM: ULTRASOUND GUIDED RIGHT THORACENTESIS MEDICATIONS: 10 mL 2% nesacaine Patient with documented allergy to lidocaine, recent tolerance of nesacaine injection 08/21/92 COMPLICATIONS: None immediate. PROCEDURE: An ultrasound guided thoracentesis was thoroughly discussed with the patient and questions answered. The benefits, risks, alternatives and complications were also discussed. The patient understands and wishes to proceed with the procedure. Written consent was obtained. Ultrasound was performed to localize and mark an adequate pocket of fluid in the right chest. The area was then prepped and draped in the normal sterile fashion. 1% Lidocaine was used for local anesthesia. Under ultrasound guidance a 6 Fr Safe-T-Centesis catheter was introduced. Thoracentesis was performed. The catheter was removed and a dressing applied. FINDINGS: A total of approximately 700 mL of yellow, clear fluid was removed. Samples were sent to the laboratory as requested by the clinical team. IMPRESSION: Successful ultrasound guided right thoracentesis yielding 700 mL of pleural fluid. Read by: Brynda Greathouse PA-C Electronically Signed   By: Aletta Edouard M.D.   On: 04/17/2019 11:26   US THORACENTESIS ASP PLEURAL SPACE W/IMG GUIDE  Addendum Date: 04/08/2019   ADDENDUM REPORT: 04/08/2019 17:38 ADDENDUM: Note, Nesacaine was utilized for subcutaneous anesthesia purposes given history of lidocaine allergy. Patient tolerated this medication without incident. Electronically Signed   By: Sandi Mariscal M.D.   On: 04/08/2019 17:38   Result Date: 04/08/2019  INDICATION: Symptomatic right-sided sided pleural effusion EXAM: US THORACENTESIS ASP PLEURAL SPACE W/IMG GUIDE COMPARISON:  Chest radiograph-04/07/2019; chest CT-04/06/2018 MEDICATIONS: None. COMPLICATIONS: None immediate. TECHNIQUE: Informed written consent was obtained from the patient after a discussion of the risks, benefits and alternatives to treatment. A timeout  was performed prior to the initiation of the procedure. Initial ultrasound scanning demonstrates a small anechoic right-sided pleural effusion. The lower chest was prepped and draped in the usual sterile fashion. 1% lidocaine was used for local anesthesia. An ultrasound image was saved for documentation purposes. An 8 Fr Safe-T-Centesis catheter was introduced under ultrasound guidance. The thoracentesis was performed. The catheter was removed and a dressing was applied. The patient tolerated the procedure well without immediate post procedural complication. The patient was escorted to have an upright chest radiograph. FINDINGS: A total of approximately 450 cc of serous fluid was removed. Requested samples were sent to the laboratory. IMPRESSION: Successful ultrasound-guided right sided thoracentesis yielding 450 cc of pleural fluid. Electronically Signed: By: Sandi Mariscal M.D. On: 04/08/2019 17:33       Subjective: Pt c/o malaise   Discharge Exam: Vitals:   04/23/19 0756 04/23/19 1351  BP: 132/77   Pulse: 83   Resp: 17   Temp: 98 F (36.7 C)   SpO2: 96% (!) 86%   Vitals:   04/23/19 0043 04/23/19 0751 04/23/19 0756 04/23/19 1351  BP: 110/69  132/77   Pulse: 77  83   Resp: 16  17   Temp: 98.6 F (37 C)  98 F (36.7 C)   TempSrc: Oral  Axillary   SpO2: 94% 96% 96% (!) 86%  Weight:      Height:        General: Pt is alert, awake, not in acute distress Cardiovascular:  S1/S2 +, no rubs, no gallops Respiratory: course breath sounds b/l Abdominal: Soft, NT, ND, bowel sounds + Extremities: no edema, no cyanosis    The results of significant diagnostics from this hospitalization (including imaging, microbiology, ancillary and laboratory) are listed below for reference.     Microbiology: Recent Results (from the past 240 hour(s))  Blood culture (routine x 2)     Status: None   Collection Time: 04/16/19  2:18 PM   Specimen: BLOOD  Result Value Ref Range Status   Specimen  Description BLOOD RIGHT ANTECUBITAL  Final   Special Requests   Final    BOTTLES DRAWN AEROBIC AND ANAEROBIC Blood Culture adequate volume   Culture   Final    NO GROWTH 5 DAYS Performed at [facility], [address] [place], [place] [address]    Report Status 04/21/2019 FINAL  Final  Respiratory Panel by RT PCR (Flu A&B, Covid) - Nasopharyngeal Swab     Status: None   Collection Time: 04/16/19  2:19 PM   Specimen: Nasopharyngeal Swab  Result Value Ref Range Status   SARS Coronavirus 2 by RT PCR NEGATIVE NEGATIVE Final    Comment: (NOTE) SARS-CoV-2 target nucleic acids are NOT DETECTED. The SARS-CoV-2 RNA is generally detectable in upper respiratoy specimens during the acute phase of infection. The lowest concentration of SARS-CoV-2 viral copies this assay can detect is 131 copies/mL. A negative result does not preclude SARS-Cov-2 infection and should not be used as the sole basis for treatment or other patient management decisions. A negative result may occur with  improper specimen collection/handling, submission of specimen other than nasopharyngeal swab, presence of viral mutation(s) within the areas targeted by this assay, and inadequate  number of viral copies (<131 copies/mL). A negative result must be combined with clinical observations, patient history, and epidemiological information. The expected result is Negative. Fact Sheet for Patients:  PinkCheek.be Fact Sheet for Healthcare Providers:  GravelBags.it This test is not yet ap proved or cleared by the [place] FDA and  has been authorized for detection and/or diagnosis of SARS-CoV-2 by FDA under an Emergency Use Authorization (EUA). This EUA will remain  in effect (meaning this test can be used) for the duration of the COVID-19 declaration under Section 564(b)(1) of the Act, 21 U.S.C. section 360bbb-3(b)(1), unless the authorization is  terminated or revoked sooner.    Influenza A by PCR NEGATIVE NEGATIVE Final   Influenza B by PCR NEGATIVE NEGATIVE Final    Comment: (NOTE) The Xpert Xpress SARS-CoV-2/FLU/RSV assay is intended as an aid in  the diagnosis of influenza from Nasopharyngeal swab specimens and  should not be used as a sole basis for treatment. Nasal washings and  aspirates are unacceptable for Xpert Xpress SARS-CoV-2/FLU/RSV  testing. Fact Sheet for Patients: PinkCheek.be Fact Sheet for Healthcare Providers: GravelBags.it This test is not yet approved or cleared by the [place] FDA and  has been authorized for detection and/or diagnosis of SARS-CoV-2 by  FDA under an Emergency Use Authorization (EUA). This EUA will remain  in effect (meaning this test can be used) for the duration of the  Covid-19 declaration under Section 564(b)(1) of the Act, 21  U.S.C. section 360bbb-3(b)(1), unless the authorization is  terminated or revoked. Performed at [facility], [address] [place], [place] [address]   Blood culture (routine x 2)     Status: None   Collection Time: 04/16/19  2:28 PM   Specimen: BLOOD  Result Value Ref Range Status   Specimen Description BLOOD BLOOD RIGHT WRIST  Final   Special Requests   Final    BOTTLES DRAWN AEROBIC AND ANAEROBIC Blood Culture adequate volume   Culture   Final    NO GROWTH 5 DAYS Performed at [facility], [address] [place], [place] [address]    Report Status 04/21/2019 FINAL  Final  MRSA PCR Screening     Status: None   Collection Time: 04/16/19 10:12 PM   Specimen: Nasopharyngeal  Result Value Ref Range Status   MRSA by PCR NEGATIVE NEGATIVE Final    Comment:        The GeneXpert MRSA Assay (FDA approved for NASAL specimens only), is one component of a comprehensive MRSA colonization surveillance program. It is not intended to diagnose MRSA infection nor to guide  or monitor treatment for MRSA infections. Performed at [facility], [address] [place], [place] [address]   Body fluid culture     Status: None (Preliminary result)   Collection Time: 04/22/19 12:13 PM   Specimen: PATH Cytology Pleural fluid  Result Value Ref Range Status   Specimen Description   Final    PLEURAL Performed at [facility], [address], [place], [place] [address]    Special Requests NONE  Final   Gram Stain   Final    FEW WBC PRESENT, PREDOMINANTLY MONONUCLEAR NO ORGANISMS SEEN Performed at [facility], [place] [address], [place],  [address]    Culture PENDING  Incomplete   Report Status PENDING  Incomplete     Labs: BNP (last 3 results) Recent Labs    04/07/19 1131 04/16/19 1414  BNP 3,036.0* >1,324.4*   Basic Metabolic  Panel: Recent Labs  Lab 04/17/19 0519 04/18/19 0450 04/19/19 0532 04/21/19 0430 04/23/19 0504  NA 133* 134* 135 137 136  K 4.8 4.9 4.6 4.2 4.1  CL 103 102 101 102 102  CO2 20* _0 GLUCOSE 138* 115* 93 79 78  BUN 16 29* 25* 17 18  CREATININE 1.04 1.21 1.00 0.90 0.92  CALCIUM 8.2* 8.3* 8.2* 8.3* 8.2*  MG 2.4  --   --   --   --   PHOS 4.3  --   --   --   --    Liver Function Tests: Recent Labs  Lab 04/17/19 0519  AST 14*  ALT 26  ALKPHOS 75  BILITOT 0.7  PROT 5.9*  ALBUMIN 2.6*   No results for input(s): LIPASE, AMYLASE in the last 168 hours. No results for input(s): AMMONIA in the last 168 hours. CBC: Recent Labs  Lab 04/18/19 0450 04/18/19 0450 04/19/19 0532 04/20/19 0339 04/21/19 0430 04/22/19 0403 04/23/19 0504  WBC 12.3*   < > 10.5 10.0 9.8 9.2 8.9  NEUTROABS 10.8*  --   --   --   --   --   --   HGB 11.3*   < > 11.9* 11.9* 12.2* 12.5* 12.5*  HCT 34.2*   < > 36.8* 36.4* 37.1* 38.5* 37.7*  MCV 89.5   < > 91.1 90.8 89.4 88.9 89.3  PLT 190   < > 199 204 218 251 249   < > = values in this interval not displayed.   Cardiac Enzymes: No results for input(s):  CKTOTAL, CKMB, CKMBINDEX, TROPONINI in the last 168 hours. BNP: Invalid input(s): POCBNP CBG: Recent Labs  Lab 04/16/19 2209  GLUCAP 144*   D-Dimer No results for input(s): DDIMER in the last 72 hours. Hgb A1c No results for input(s): HGBA1C in the last 72 hours. Lipid Profile No results for input(s): CHOL, HDL, LDLCALC, TRIG, CHOLHDL, LDLDIRECT in the last 72 hours. Thyroid function studies No results for input(s): TSH, T4TOTAL, T3FREE, THYROIDAB in the last 72 hours.  Invalid input(s): FREET3 Anemia work up No results for input(s): VITAMINB12, FOLATE, FERRITIN, TIBC, IRON, RETICCTPCT in the last 72 hours. Urinalysis    Component Value Date/Time   COLORURINE STRAW (A) 10/14/2018 1112   APPEARANCEUR CLEAR (A) 10/14/2018 1112   APPEARANCEUR Clear 11/02/2014 1602   LABSPEC 1.036 (H) 10/14/2018 1112   PHURINE 6.0 10/14/2018 1112   GLUCOSEU NEGATIVE 10/14/2018 1112   HGBUR NEGATIVE 10/14/2018 1112   BILIRUBINUR NEGATIVE 10/14/2018 1112   BILIRUBINUR Negative 11/02/2014 1602   KETONESUR 5 (A) 10/14/2018 1112   PROTEINUR NEGATIVE 10/14/2018 1112   NITRITE NEGATIVE 10/14/2018 1112   LEUKOCYTESUR NEGATIVE 10/14/2018 1112   Sepsis Labs Invalid input(s): PROCALCITONIN,  WBC,  LACTICIDVEN Microbiology Recent Results (from the past 240 hour(s))  Blood culture (routine x 2)     Status: None   Collection Time: 04/16/19  2:18 PM   Specimen: BLOOD  Result Value Ref Range Status   Specimen Description BLOOD RIGHT ANTECUBITAL  Final   Special Requests   Final    BOTTLES DRAWN AEROBIC AND ANAEROBIC Blood Culture adequate volume   Culture   Final    NO GROWTH 5 DAYS Performed at [facility], [address], [place], [place] [address]    Report Status 04/21/2019 FINAL  Final  Respiratory Panel by RT PCR (Flu A&B, Covid) - Nasopharyngeal Swab     Status: None   Collection Time: 04/16/19  2:19 PM   Specimen: Nasopharyngeal Swab  Result Value Ref Range Status   SARS  Coronavirus 2 by RT PCR NEGATIVE NEGATIVE Final    Comment: (NOTE) SARS-CoV-2 target nucleic acids are NOT DETECTED. The SARS-CoV-2 RNA is generally detectable in upper respiratoy specimens during the acute phase of infection. The lowest concentration of SARS-CoV-2 viral copies this assay can detect is 131 copies/mL. A negative result does not preclude SARS-Cov-2 infection and should not be used as the sole basis for treatment or other patient management decisions. A negative result may occur with  improper specimen collection/handling, submission of specimen other than nasopharyngeal swab, presence of viral mutation(s) within the areas targeted by this assay, and inadequate number of viral copies (<131 copies/mL). A negative result must be combined with clinical observations, patient history, and epidemiological information. The expected result is Negative. Fact Sheet for Patients:  PinkCheek.be Fact Sheet for Healthcare Providers:  GravelBags.it This test is not yet ap proved or cleared by the [place] FDA and  has been authorized for detection and/or diagnosis of SARS-CoV-2 by FDA under an Emergency Use Authorization (EUA). This EUA will remain  in effect (meaning this test can be used) for the duration of the COVID-19 declaration under Section 564(b)(1) of the Act, 21 U.S.C. section 360bbb-3(b)(1), unless the authorization is terminated or revoked sooner.    Influenza A by PCR NEGATIVE NEGATIVE Final   Influenza B by PCR NEGATIVE NEGATIVE Final    Comment: (NOTE) The Xpert Xpress SARS-CoV-2/FLU/RSV assay is intended as an aid in  the diagnosis of influenza from Nasopharyngeal swab specimens and  should not be used as a sole basis for treatment. Nasal washings and  aspirates are unacceptable for Xpert Xpress SARS-CoV-2/FLU/RSV  testing. Fact Sheet for Patients: PinkCheek.be Fact Sheet  for Healthcare Providers: GravelBags.it This test is not yet approved or cleared by the [place] FDA and  has been authorized for detection and/or diagnosis of SARS-CoV-2 by  FDA under an Emergency Use Authorization (EUA). This EUA will remain  in effect (meaning this test can be used) for the duration of the  Covid-19 declaration under Section 564(b)(1) of the Act, 21  U.S.C. section 360bbb-3(b)(1), unless the authorization is  terminated or revoked. Performed at [facility], [address] [place], [place] [address]   Blood culture (routine x 2)     Status: None   Collection Time: 04/16/19  2:28 PM   Specimen: BLOOD  Result Value Ref Range Status   Specimen Description BLOOD BLOOD RIGHT WRIST  Final   Special Requests   Final    BOTTLES DRAWN AEROBIC AND ANAEROBIC Blood Culture adequate volume   Culture   Final    NO GROWTH 5 DAYS Performed at [facility], [address] [place], [place] [address]    Report Status 04/21/2019 FINAL  Final  MRSA PCR Screening     Status: None   Collection Time: 04/16/19 10:12 PM   Specimen: Nasopharyngeal  Result Value Ref Range Status   MRSA by PCR NEGATIVE NEGATIVE Final    Comment:        The GeneXpert MRSA Assay (FDA approved for NASAL specimens only), is one component of a comprehensive MRSA colonization surveillance program. It is not intended to diagnose MRSA infection nor to guide or monitor treatment for MRSA infections. Performed at [facility], [address] [place], [place] [address]   Body fluid culture     Status: None (Preliminary result)  Collection Time: 04/22/19 12:13 PM   Specimen: PATH Cytology Pleural fluid  Result Value Ref Range Status   Specimen Description   Final    PLEURAL Performed at [facility], [address] [place], [place] [address]    Special Requests NONE  Final   Gram Stain   Final    FEW WBC PRESENT, PREDOMINANTLY  MONONUCLEAR NO ORGANISMS SEEN Performed at [facility], [place] [address], [place], [place] [address]    Culture PENDING  Incomplete   Report Status PENDING  Incomplete     Time coordinating discharge: Over 30 minutes  SIGNED:   Wyvonnia Dusky, MD  [facility] 04/23/2019, 3:20 PM Pager   If 7PM-7AM, please contact night-coverage [email or web address]

## 2019-04-23 NOTE — Progress Notes (Signed)
Pulmonary Medicine          Date: 04/23/2019,   MRN# [id number] Joseph Peters June 14, 1955     AdmissionWeight: 54.3 kg                 CurrentWeight: 53.3 kg   Referring Physician: Dr Jimmye Norman   CHIEF COMPLAINT:   Advanced Emphysema with Mycobacterium Avium Complex    SUBJECTIVE   Patient clinically improved , walked with PT.  He states he feels close to baseline wants to go home.  We have set up 7d close follow up.   PAST MEDICAL HISTORY   Past Medical History:  Diagnosis Date  . AAA (abdominal aortic aneurysm) without rupture ([place])   . Abdominal aneurysm ([place]) 2015   being followed but not large enough to surgically treat  . Alcoholism ([place])   . Anxiety   . Arthritis   . Asthma   . Benign hypertension 07/16/2014  . Chest pain on exertion 07/16/2014  . Chronic pain syndrome   . Coccydynia 02/08/2012  . COPD (chronic obstructive pulmonary disease) ([place])    patient smokes  . Coronary artery disease   . 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 Depression   . 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 Dyspnea   . Frequent falls   . GERD (gastroesophageal reflux disease)   . Hyperlipidemia   . IBS (irritable bowel syndrome)   . Myocardial infarction ([place]) 2010  . Neuropathy   . Pneumonia   . Prostatitis   . Restless leg syndrome   . Sciatica   . Spinal stenosis of lumbar region   . Stroke ([place]) 2015   no residual symptoms  . Tremor, essential      SURGICAL HISTORY   Past Surgical History:  Procedure Laterality Date  . CARDIAC CATHETERIZATION  2012  . COLONOSCOPY WITH PROPOFOL N/A 11/14/2016   Procedure: COLONOSCOPY WITH PROPOFOL;  Surgeon: Manya Silvas, MD;  Location: [facility] ENDOSCOPY;  Service: Endoscopy;  Laterality: N/A;  . ESOPHAGOGASTRODUODENOSCOPY (EGD) WITH PROPOFOL N/A 11/14/2016   Procedure: ESOPHAGOGASTRODUODENOSCOPY (EGD) WITH PROPOFOL;  Surgeon: Manya Silvas, MD;  Location: [facility] ENDOSCOPY;   Service: Endoscopy;  Laterality: N/A;  . JOINT REPLACEMENT Right    hip  . KNEE SURGERY     right  . SHOULDER ARTHROSCOPY WITH OPEN ROTATOR CUFF REPAIR Right 08/20/2017   Procedure: SHOULDER ARTHROSCOPY WITH OPEN ROTATOR CUFF REPAIR;  Surgeon: Corky Mull, MD;  Location: [facility] ORS;  Service: Orthopedics;  Laterality: Right;  . SHOULDER ARTHROSCOPY WITH SUBACROMIAL DECOMPRESSION, ROTATOR CUFF REPAIR AND BICEP TENDON REPAIR Left 05/10/2016   Procedure: SHOULDER ARTHROSCOPY WITH DEBTRIDEMENT, DECOMPRESSION, REPAIR OF MASSIVE ROTATOR CUFF TEAR AND BICEP TENODESIS;  Surgeon: Corky Mull, MD;  Location: [facility] ORS;  Service: Orthopedics;  Laterality: Left;  Massive cuff tear  . TOTAL HIP ARTHROPLASTY Right 07/01/2018   Procedure: TOTAL HIP ARTHROPLASTY - RIGHT;  Surgeon: Corky Mull, MD;  Location: [facility] ORS;  Service: Orthopedics;  Laterality: Right;  Marland Kitchen VIDEO BRONCHOSCOPY WITH ENDOBRONCHIAL NAVIGATION N/A 02/13/2019   Procedure: VIDEO BRONCHOSCOPY WITH ENDOBRONCHIAL NAVIGATION;  Surgeon: Ottie Glazier, MD;  Location: [facility] ORS;  Service: Thoracic;  Laterality: N/A;  . VIDEO BRONCHOSCOPY WITH ENDOBRONCHIAL NAVIGATION N/A 03/13/2019   Procedure: VIDEO BRONCHOSCOPY WITH ENDOBRONCHIAL NAVIGATION;  Surgeon: Ottie Glazier, MD;  Location: [facility] ORS;  Service: Thoracic;  Laterality: N/A;  . VIDEO BRONCHOSCOPY WITH ENDOBRONCHIAL ULTRASOUND N/A 02/13/2019   Procedure: VIDEO  BRONCHOSCOPY WITH ENDOBRONCHIAL ULTRASOUND;  Surgeon: Ottie Glazier, MD;  Location: [facility] ORS;  Service: Thoracic;  Laterality: N/A;  . VIDEO BRONCHOSCOPY WITH ENDOBRONCHIAL ULTRASOUND N/A 03/13/2019   Procedure: VIDEO BRONCHOSCOPY WITH ENDOBRONCHIAL ULTRASOUND;  Surgeon: Ottie Glazier, MD;  Location: [facility] ORS;  Service: Thoracic;  Laterality: N/A;     FAMILY HISTORY   Family History  Problem Relation Age of Onset  . Cancer Mother   . COPD Father 35       black lung     SOCIAL HISTORY   Social History   Tobacco Use  .  Smoking status: Light Tobacco Smoker    Packs/day: 0.50    Years: 30.00    Pack years: 15.00    Types: Cigarettes    Start date: 02/26/1973    Last attempt to quit: 09/21/2018    Years since quitting: 0.5  . Smokeless tobacco: Former Systems developer    Quit date: 02/26/1998  . Tobacco comment: smokes 2-3 cig a week 02/12/19  Substance Use Topics  . Alcohol use: Yes    Alcohol/week: 20.0 standard drinks    Types: 20 Cans of beer per week    Comment: week   . Drug use: Yes    Comment: prescribed hydrocodone     MEDICATIONS    Home Medication:    Current Medication:  Current Facility-Administered Medications:  .  acetylcysteine (MUCOMYST) 20 % nebulizer / oral solution 4 mL, 4 mL, Nebulization, BID, Ilai Hiller, MD, 4 mL at 04/23/19 0750 .  azithromycin (ZITHROMAX) tablet 500 mg, 500 mg, Oral, Q0600, Tsosie Billing, MD, 500 mg at 04/23/19 0536 .  Chlorhexidine Gluconate Cloth 2 % PADS 6 each, 6 each, Topical, Daily, Max Sane, MD, 6 each at 04/22/19 1058 .  chloroprocaine (NESACAINE-MPF) 2 % injection 10 mL, 10 mL, Infiltration, Once, Zigmund Daniel, Herma Carson, PA .  chloroprocaine (NESACAINE-MPF) 2 % injection 20 mL, 20 mL, Other, Once, Jacqulynn Cadet, MD .  enoxaparin (LOVENOX) injection 40 mg, 40 mg, Subcutaneous, Q24H, Blakeney, Dreama Saa, NP, 40 mg at 04/22/19 1732 .  ethambutol (MYAMBUTOL) tablet 400 mg, 400 mg, Oral, BID, Ravishankar, Jayashree, MD, 400 mg at 04/23/19 0852 .  feeding supplement (ENSURE ENLIVE) (ENSURE ENLIVE) liquid 237 mL, 237 mL, Oral, TID BM, Manuella Ghazi, Vipul, MD, 237 mL at 04/23/19 1035 .  finasteride (PROSCAR) tablet 5 mg, 5 mg, Oral, Daily, Max Sane, MD, 5 mg at 04/23/19 0852 .  FLUoxetine (PROZAC) capsule 20 mg, 20 mg, Oral, Daily, Max Sane, MD, 20 mg at 04/23/19 0853 .  gabapentin (NEURONTIN) capsule 900 mg, 900 mg, Oral, QHS, Max Sane, MD, 900 mg at 04/22/19 2124 .  ipratropium-albuterol (DUONEB) 0.5-2.5 (3) MG/3ML nebulizer solution 3 mL, 3 mL,  Nebulization, Q6H, Blakeney, Dana G, NP, 3 mL at 04/23/19 1351 .  ipratropium-albuterol (DUONEB) 0.5-2.5 (3) MG/3ML nebulizer solution 3 mL, 3 mL, Nebulization, Q6H PRN, Awilda Bill, NP .  losartan (COZAAR) tablet 25 mg, 25 mg, Oral, Daily, Max Sane, MD, 25 mg at 04/23/19 0852 .  MEDLINE mouth rinse, 15 mL, Mouth Rinse, BID, Awilda Bill, NP, 15 mL at 04/22/19 2126 .  metoprolol succinate (TOPROL-XL) 24 hr tablet 25 mg, 25 mg, Oral, Daily, Max Sane, MD, 25 mg at 04/23/19 0853 .  mirtazapine (REMERON) tablet 7.5 mg, 7.5 mg, Oral, QHS, Mahan, Kasie J, NP, 7.5 mg at 04/22/19 2124 .  montelukast (SINGULAIR) tablet 10 mg, 10 mg, Oral, QHS, Max Sane, MD, 10 mg at 04/22/19 2123 .  morphine CONCENTRATE  10 MG/0.5ML oral solution 10 mg, 10 mg, Oral, Q2H PRN, Earlie Counts, NP, 10 mg at 04/22/19 2123 .  multivitamin with minerals tablet 1 tablet, 1 tablet, Oral, Daily, Max Sane, MD, 1 tablet at 04/23/19 0853 .  ondansetron (ZOFRAN-ODT) disintegrating tablet 4 mg, 4 mg, Oral, Q8H PRN, Max Sane, MD, 4 mg at 04/17/19 1025 .  rifampin (RIFADIN) capsule 300 mg, 300 mg, Oral, BID, Ravishankar, Jayashree, MD, 300 mg at 04/23/19 0854 .  roflumilast (DALIRESP) tablet 250 mcg, 250 mcg, Oral, Daily, Max Sane, MD, 250 mcg at 04/23/19 [phone] .  sodium chloride 0.9 % bolus 1,000 mL, 1,000 mL, Intravenous, Once, Duffy Bruce, MD, Stopped at 04/16/19 1450 .  tamsulosin (FLOMAX) capsule 0.4 mg, 0.4 mg, Oral, QPC supper, Max Sane, MD, 0.4 mg at 04/22/19 1732    ALLERGIES   Penicillins and Lidocaine     REVIEW OF SYSTEMS    Review of Systems:  Gen:  Denies  fever, sweats, chills weigh loss  HEENT: Denies blurred vision, double vision, ear pain, eye pain, hearing loss, nose bleeds, sore throat Cardiac:  No dizziness, chest pain or heaviness, chest tightness,edema Resp:   Denies cough or sputum porduction, shortness of breath,wheezing, hemoptysis,  Gi: Denies swallowing difficulty,  stomach pain, nausea or vomiting, diarrhea, constipation, bowel incontinence Gu:  Denies bladder incontinence, burning urine Ext:   Denies Joint pain, stiffness or swelling Skin: Denies  skin rash, easy bruising or bleeding or hives Endoc:  Denies polyuria, polydipsia , polyphagia or weight change Psych:   Denies depression, insomnia or hallucinations   Other:  All other systems negative   VS: BP 132/77 (BP Location: Right Arm)   Pulse 83   Temp 98 F (36.7 C) (Axillary)   Resp 17   Ht _0  (1.651 m)   Wt 53.3 kg   SpO2 (!) 86%   BMI 19.55 kg/m      PHYSICAL EXAM    GENERAL:NAD, no fevers, chills, no weakness no fatigue HEAD: Normocephalic, atraumatic.  EYES: Pupils equal, round, reactive to light. Extraocular muscles intact. No scleral icterus.  MOUTH: Moist mucosal membrane. Dentition intact. No abscess noted.  EAR, NOSE, THROAT: Clear without exudates. No external lesions.  NECK: Supple. No thyromegaly. No nodules. No JVD.  PULMONARY: Improved aeriation from previous. Mild rhonci and decreased bs at RUL CARDIOVASCULAR: S1 and S2. Regular rate and rhythm. No murmurs, rubs, or gallops. No edema. Pedal pulses 2+ bilaterally.  GASTROINTESTINAL: Soft, nontender, nondistended. No masses. Positive bowel sounds. No hepatosplenomegaly.  MUSCULOSKELETAL: No swelling, clubbing, or edema. Range of motion full in all extremities.  NEUROLOGIC: Cranial nerves II through XII are intact. No gross focal neurological deficits. Sensation intact. Reflexes intact.  SKIN: No ulceration, lesions, rashes, or cyanosis. Skin warm and dry. Turgor intact.  PSYCHIATRIC: Mood, affect within normal limits. The patient is awake, alert and oriented x 3. Insight, judgment intact.       IMAGING    DG Chest 2 View  Result Date: 04/07/2019 CLINICAL DATA:  Chest pain and shortness of breath EXAM: CHEST - 2 VIEW COMPARISON:  March 13, 2019 chest radiograph and chest CT February 13, 2019 FINDINGS: There  remains consolidation with volume loss in the right upper lobe with slightly improved aeration in this area compared to prior studies. Elsewhere, there is extensive underlying emphysematous change with fibrosis. Fibrosis is most severe in the right base region. No new opacity evident. Heart size and pulmonary vascularity are normal. No adenopathy is evident.  There is aortic atherosclerosis. No bone lesions. There is calcification in each carotid artery. IMPRESSION: Underlying emphysematous change in fibrosis. Consolidation in the right upper lobe is slightly less pronounced than on recent prior studies. No new opacity evident. Stable cardiac silhouette. No adenopathy demonstrable by radiography. Aortic Atherosclerosis (ICD10-I70.0). Electronically Signed   By: Lowella Grip III M.D.   On: 04/07/2019 11:20   CT Angio Chest PE W and/or Wo Contrast  Result Date: 04/16/2019 CLINICAL DATA:  Shortness of breath and hypoxia EXAM: CT ANGIOGRAPHY CHEST WITH CONTRAST TECHNIQUE: Multidetector CT imaging of the chest was performed using the standard protocol during bolus administration of intravenous contrast. Multiplanar CT image reconstructions and MIPs were obtained to evaluate the vascular anatomy. CONTRAST:  52m OMNIPAQUE IOHEXOL 350 MG/ML SOLN COMPARISON:  04/07/2019. FINDINGS: Cardiovascular: Atheromatous calcifications, including the coronary arteries and aorta. Normally opacified pulmonary arteries with no pulmonary arterial filling defects seen. The ascending thoracic aorta measures 4.5 cm in maximum diameter, previously 4.6 cm. Aneurysmal dilatation of the descending thoracic aorta with a maximum diameter of 3.5 cm, previously 3.8 cm. Aneurysmal dilatation of the infrarenal abdominal aorta with a maximum diameter of 4.0 cm on the included portions. Mediastinum/Nodes: Mildly enlarged right hilar lymph nodes with little change. The largest measures 1.1 cm in short axis diameter on image number 55 series 4.  Thyroid gland, trachea, and esophagus demonstrate no significant findings. Lungs/Pleura: Stable marked bilateral centrilobular bullous changes. Previously demonstrated right upper lobe consolidation with air bronchograms and focal distal bronchial dilatation without significant change. Small amount of consolidation in the posterior left lower lobe with little change. Moderate-sized right pleural effusion and small to moderate-sized left pleural effusion, both increased. Upper Abdomen: Dilated in for abdominal aorta as described above, not included in its entirety. Musculoskeletal: Stable 20% inferior endplate compression deformity of a midthoracic vertebral body. Review of the MIP images confirms the above findings. IMPRESSION: 1. No pulmonary emboli. 2. Stable right upper lobe consolidation with air bronchograms and focal distal bronchial dilatation. 3. Stable small amount of consolidation in the posterior left lower lobe. 4. Stable mild right hilar adenopathy, most likely reactive. 5. Stable marked changes of COPD. 6. Stable aneurysmal dilatation of the ascending thoracic aorta and descending thoracic aorta with interval included aneurysmal dilatation of the infrarenal abdominal aorta, not included in its entirety, measuring 4.0 cm. 7. Moderate-sized right pleural effusion and small to moderate-sized left pleural effusion, both increased. 8. Calcific coronary artery and aortic atherosclerosis. Aortic Atherosclerosis (ICD10-I70.0) and Emphysema (ICD10-J43.9). Electronically Signed   By: SClaudie ReveringM.D.   On: 04/16/2019 16:28   CT Angio Chest PE W/Cm &/Or Wo Cm  Result Date: 04/07/2019 CLINICAL DATA:  Shortness of breath. EXAM: CT ANGIOGRAPHY CHEST WITH CONTRAST TECHNIQUE: Multidetector CT imaging of the chest was performed using the standard protocol during bolus administration of intravenous contrast. Multiplanar CT image reconstructions and MIPs were obtained to evaluate the vascular anatomy. CONTRAST:  6102m OMNIPAQUE IOHEXOL 350 MG/ML SOLN COMPARISON:  Chest radiograph April 07, 2019; chest CT February 13, 2019 FINDINGS: Cardiovascular: There is no demonstrable pulmonary embolus. Ascending thoracic aortic diameter measures 4.6 x 4.5 cm. No dissection is appreciable. The contrast bolus in the aorta is somewhat less than optimal for dissection assessment. There is localized aneurysmal dilatation in the distal descending thoracic aorta at the diaphragmatic hiatus with localized plaque and calcification along the leftward aspect of this aneurysm. The descending thoracic aorta at the level of the diaphragmatic hiatus measures 3.8 x 3.1  cm. Appearance of this aneurysm is similar to most recent CT. There is aortic atherosclerosis as well as scattered foci of calcification in proximal visualized great vessels. There are foci of coronary artery calcification. There is left ventricular hypertrophy. No pericardial effusion or pericardial thickening. Mediastinum/Nodes: Thyroid appears unremarkable. There are mildly prominent right hilar lymph nodes, largest measuring 1.5 x 1.3 cm. Subcarinal lymph node measures 1.4 x 1.2 cm. No esophageal lesions are evident. Lungs/Pleura: There is a moderate right pleural effusion with a much smaller left pleural effusion. There is extensive underlying emphysematous change with fibrosis in the lower lung regions, significantly more on the right than on the left. There are multiple areas of cicatrization throughout the lungs bilaterally, more severe on the right than on the left. There is consolidation in the anterior segment right upper lobe with air bronchograms and volume loss. No similar consolidation elsewhere. Upper Abdomen: There is upper abdominal aortic atherosclerosis. Visualized upper abdominal structures otherwise appear unremarkable. Musculoskeletal: Anterior wedging of a midthoracic vertebral body is stable. No blastic or lytic bone lesions. No evident chest wall lesions. Review of  the MIP images confirms the above findings. IMPRESSION: 1.  No demonstrable pulmonary embolus. 2. Prominence of the ascending thoracic aorta with a measured diameter of 4.6 x 4.5 cm. Ascending thoracic aortic aneurysm. Recommend semi-annual imaging followup by CTA or MRA and referral to cardiothoracic surgery if not already obtained. This recommendation follows 2010 ACCF/AHA/AATS/ACR/ASA/SCA/SCAI/SIR/STS/SVM Guidelines for the Diagnosis and Management of Patients With Thoracic Aortic Disease. Circulation. 2010; 121: Y099-I338. Aortic aneurysm NOS (ICD10-I71.9). No dissection evident with contrast bolus somewhat less than optimal for dissection assessment. 3. Localized aneurysmal dilatation of the descending thoracic aorta at the diaphragmatic hiatus level measuring 3.8 x 3.1 cm. Localized plaque and dilatation along the leftward aspect of the aneurysm, also present previously. Note that there is aortic atherosclerosis as well as foci of great vessel and coronary artery calcification. 4. 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 Pleural effusions bilaterally, larger on the right than on the left. Persistent consolidation in the right upper lobe anteriorly with air bronchograms. Appearance most consistent with pneumonia with possibility of superimposed neoplasm not excluded anteriorly toward the apex on the right. 5.  Prominent right hilar and subcarinal lymph nodes. Aortic Atherosclerosis (ICD10-I70.0) and Emphysema (ICD10-J43.9). Aortic aneurysm NOS (ICD10-I71.9). Electronically Signed   By: Lowella Grip III M.D.   On: 04/07/2019 13:17   DG Chest Right Decubitus  Result Date: 04/21/2019 CLINICAL DATA:  Dyspnea, recent right-sided thoracentesis EXAM: CHEST - RIGHT DECUBITUS; CHEST - LEFT DECUBITUS COMPARISON:  CT 04/16/2019 FINDINGS: Bilateral decubitus views of the chest were obtained. Small to moderate right-sided pleural effusion with small loculated component  at the right upper lobe. Trace layering left-sided pleural effusion without loculated component. There is no pneumothorax. Chronic consolidation in the right upper lobe. Fibrotic changes throughout both lungs. Stable heart size. IMPRESSION: 1. Negative for pneumothorax. 2. Small to moderate right-sided pleural effusion with small loculated component at the right upper lobe. 3. Trace layering left-sided pleural effusion without loculated component. Electronically Signed   By: Davina Poke D.O.   On: 04/21/2019 11:17   DG Chest Left Decubitus  Result Date: 04/21/2019 CLINICAL DATA:  Dyspnea, recent right-sided thoracentesis EXAM: CHEST - RIGHT DECUBITUS; CHEST - LEFT DECUBITUS COMPARISON:  CT 04/16/2019 FINDINGS: Bilateral decubitus views of the chest were obtained. Small to moderate right-sided pleural effusion with small loculated component at the right upper lobe.  Trace layering left-sided pleural effusion without loculated component. There is no pneumothorax. Chronic consolidation in the right upper lobe. Fibrotic changes throughout both lungs. Stable heart size. IMPRESSION: 1. Negative for pneumothorax. 2. Small to moderate right-sided pleural effusion with small loculated component at the right upper lobe. 3. Trace layering left-sided pleural effusion without loculated component. Electronically Signed   By: Davina Poke D.O.   On: 04/21/2019 11:17   DG Chest Port 1 View  Result Date: 04/22/2019 CLINICAL DATA:  Right-sided pleural effusion EXAM: PORTABLE CHEST 1 VIEW COMPARISON:  04/17/2019 FINDINGS: Stable cardiomediastinal contours. Chronic consolidation within the right upper lobe. Fibrotic changes throughout both lungs. No significant pleural fluid collection is seen on frontal view. No pneumothorax IMPRESSION: No pneumothorax status post thoracentesis. Electronically Signed   By: Davina Poke D.O.   On: 04/22/2019 13:06   DG Chest Port 1 View  Result Date: 04/17/2019 CLINICAL DATA:   Right-sided thoracentesis EXAM: PORTABLE CHEST 1 VIEW COMPARISON:  Yesterday FINDINGS: Interstitial and airspace opacity in the right upper lobe with volume loss. Normal heart size and stable mediastinal contours. No visible pneumothorax. Trace left pleural effusion. IMPRESSION: 1. No complicating features after thoracentesis. 2. Emphysema and stable right upper lobe opacity with volume loss. Electronically Signed   By: Monte Fantasia M.D.   On: 04/17/2019 11:17   DG Chest Portable 1 View  Result Date: 04/16/2019 CLINICAL DATA:  Shortness of breath EXAM: PORTABLE CHEST 1 VIEW COMPARISON:  April 08, 2019 chest radiograph and chest CT Jul 05 2019 FINDINGS: Extensive fibrosis is noted throughout the lungs bilaterally. There may be a degree of superimposed interstitial edema given increase in interstitial thickening compared to recent CT examination. There is equivocal increase in interstitial thickening bilaterally compared to most recent chest radiograph. There may be patchy airspace opacity intermingled with interstitial prominence. There remains extensive consolidation in the right upper lobe. Heart size and pulmonary vascularity are stable and within normal limits given the underlying fibrosis. No adenopathy is appreciable by radiography. No bone lesions. IMPRESSION: Persistent extensive fibrosis with suspected superimposed interstitial edema and possible interspersed airspace opacity. The degree of ill-defined opacity, primarily in the left lower lobe is modestly increased compared to most recent chest radiograph and is definitely increased compared to most recent CT. Consolidation remains in the right upper lobe. Stable cardiac silhouette. Electronically Signed   By: Lowella Grip III M.D.   On: 04/16/2019 14:43   DG Chest Port 1 View  Result Date: 04/08/2019 CLINICAL DATA:  Post right-sided thoracentesis. EXAM: PORTABLE CHEST 1 VIEW COMPARISON:  04/07/2019; chest CT-04/07/2019 FINDINGS: Grossly  unchanged cardiac silhouette and mediastinal contours with persistent obscuration of the right heart border and mediastinal stripe secondary to worsening right upper lobe heterogeneous opacities and associated volume loss. Worsening right mid and bilateral lower lung interstitial thickening. Interval reduction/resolution of right-sided pleural effusion post thoracentesis. No pneumothorax. No acute osseous abnormalities. IMPRESSION: 1. Interval reduction/resolution of right-sided pleural effusion post thoracentesis. No pneumothorax. 2. Worsening right heterogeneous/consolidative opacities and associated volume loss. 3. Worsening right mid bilateral lower lung interstitial thickening with broad differential considerations including pulmonary edema versus progression of multifocal infection, including atypical etiologies. Electronically Signed   By: Sandi Mariscal M.D.   On: 04/08/2019 17:31   ECHOCARDIOGRAM COMPLETE  Result Date: 04/08/2019    ECHOCARDIOGRAM REPORT   Patient Name:   Joseph Peters Date of Exam: 04/07/2019 Medical Rec #:  [id number]        Height:  65.0 in Accession #:    [id number]       Weight:       120.6 lb Date of Birth:  07-27-1955        BSA:          1.60 m Patient Age:    41 years         BP:           121/92 mmHg Patient Gender: M                HR:           96 bpm. Exam Location:  [facility] Procedure: 2D Echo, Cardiac Doppler and Color Doppler Indications:     Elevated Troponin  History:         Patient has no prior history of Echocardiogram examinations.                  Risk Factors:Dyslipidemia and Hypertension. Stroke. Pneumonia.                  Myocardial Infarction. Dyspnea. Coronary artery disease. COPD.                  Chest pain. AAA.  Sonographer:     Wilford Sports Rodgers-Jones Referring Phys:  [place] Diagnosing Phys: Isaias Cowman MD IMPRESSIONS  1. Left ventricular ejection fraction, by estimation, is 35 to 40%. The left ventricle has moderately decreased  function. The left ventrical has no regional wall motion abnormalities. The left ventricular internal cavity size was mildly to moderately dilated. Left ventricular diastolic parameters are indeterminate.  2. Right ventricular systolic function is normal. The right ventricular size is normal.  3. No left atrial/left atrial appendage thrombus was detected.  4. The mitral valve is normal in structure and function. Mild mitral valve regurgitation. No evidence of mitral stenosis.  5. The aortic valve is normal in structure and function. Aortic valve regurgitation is not visualized. Mild to moderate aortic valve stenosis.  6. The inferior vena cava is normal in size with greater than 50% respiratory variability, suggesting right atrial pressure of 3 mmHg. FINDINGS  Left Ventricle: Left ventricular ejection fraction, by estimation, is 35 to 40%. The left ventricle has moderately decreased function. The left ventricle has no regional wall motion abnormalities. The left ventricular internal cavity size was mildly to moderately dilated. There is no. There is no left ventricular hypertrophy. Left ventricular diastolic parameters are indeterminate.  LV Wall Scoring: The basal inferolateral segment, mid inferoseptal segment, apical septal segment, basal inferior segment, and apex are hypokinetic. Right Ventricle: The right ventricular size is normal. No increase in right ventricular wall thickness. Right ventricular systolic function is normal. Left Atrium: Left atrial size was normal in size. Right Atrium: Right atrial size was normal in size. Pericardium: There is no evidence of pericardial effusion. Mitral Valve: The mitral valve is normal in structure and function. Normal mobility of the mitral valve leaflets. Mild mitral valve regurgitation. No evidence of mitral valve stenosis. Tricuspid Valve: The tricuspid valve is normal in structure. Tricuspid valve regurgitation is mild . No evidence of tricuspid stenosis. Aortic  Valve: The aortic valve is normal in structure and function. Aortic valve regurgitation is not visualized. Mild to moderate aortic stenosis is present. Aortic valve mean gradient measures 6.7 mmHg. Aortic valve peak gradient measures 9.2 mmHg. Aortic valve area, by VTI measures 0.96 cm. Pulmonic Valve: The pulmonic valve was normal in structure. Pulmonic valve regurgitation is not visualized.  No evidence of pulmonic stenosis. Aorta: The aortic root is normal in size and structure. Venous: The inferior vena cava is normal in size with greater than 50% respiratory variability, suggesting right atrial pressure of 3 mmHg. The inferior vena cava and the hepatic vein show a normal flow pattern. IAS/Shunts: No atrial level shunt detected by color flow Doppler.  LEFT VENTRICLE PLAX 2D LVIDd:         5.61 cm  Diastology LVIDs:         4.21 cm  LV e' lateral:   4.35 cm/s LV PW:         0.94 cm  LV E/e' lateral: 13.5 LV IVS:        0.68 cm  LV e' medial:    5.00 cm/s LVOT diam:     2.20 cm  LV E/e' medial:  11.7 LV SV:         23.64 ml LV SV Index:   47.63 LVOT Area:     3.80 cm  RIGHT VENTRICLE RV Basal diam:  3.68 cm RV S prime:     10.40 cm/s TAPSE (M-mode): 1.8 cm LEFT ATRIUM           Index       RIGHT ATRIUM           Index LA diam:      4.20 cm 2.63 cm/m  RA Area:     13.50 cm LA Vol (A2C): 40.0 ml 25.07 ml/m RA Volume:   39.70 ml  24.88 ml/m LA Vol (A4C): 17.9 ml 11.22 ml/m  AORTIC VALVE AV Area (Vmax):    0.94 cm AV Area (Vmean):   0.87 cm AV Area (VTI):     0.96 cm AV Vmax:           152.00 cm/s AV Vmean:          122.333 cm/s AV VTI:            0.246 m AV Peak Grad:      9.2 mmHg AV Mean Grad:      6.7 mmHg LVOT Vmax:         37.45 cm/s LVOT Vmean:        27.900 cm/s LVOT VTI:          0.062 m LVOT/AV VTI ratio: 0.25  AORTA Ao Root diam: 3.60 cm MITRAL VALVE MV Area (PHT): 7.66 cm             SHUNTS MV Decel Time: 99 msec              Systemic VTI:  0.06 m MV E velocity: 58.70 cm/s 103 cm/s  Systemic  Diam: 2.20 cm MV A velocity: 52.20 cm/s 70.3 cm/s MV E/A ratio:  1.12       1.5 Isaias Cowman MD Electronically signed by Isaias Cowman MD Signature Date/Time: 04/08/2019/1:00:24 PM    Final    Pulmonary Function Test [facility] Only  Result Date: 04/23/2019 I DO NOT SEE PFT's for this encounter  US THORACENTESIS ASP PLEURAL SPACE W/IMG GUIDE  Result Date: 04/22/2019 INDICATION: 64 year old male with recurrent right-sided pleural effusion. EXAM: ULTRASOUND GUIDED RIGHT THORACENTESIS MEDICATIONS: None. COMPLICATIONS: None immediate. PROCEDURE: An ultrasound guided thoracentesis was thoroughly discussed with the patient and questions answered. The benefits, risks, alternatives and complications were also discussed. The patient understands and wishes to proceed with the procedure. Written consent was obtained. Ultrasound was performed to localize and mark an adequate pocket of fluid in the  right chest. The area was then prepped and draped in the normal sterile fashion. 1% Lidocaine was used for local anesthesia. Under ultrasound guidance a 6 Fr Safe-T-Centesis catheter was introduced. Thoracentesis was performed. The catheter was removed and a dressing applied. FINDINGS: A total of approximately 450 mL of yellow pleural fluid was removed. Samples were sent to the laboratory if requested by the clinical team. IMPRESSION: Successful ultrasound guided right thoracentesis yielding 450 mL of pleural fluid. Electronically Signed   By: Jacqulynn Cadet M.D.   On: 04/22/2019 17:08   US THORACENTESIS ASP PLEURAL SPACE W/IMG GUIDE  Result Date: 04/17/2019 INDICATION: Patient with history of COPD, shortness of breath, right-sided pleural effusion. Request is made for diagnostic and therapeutic right thoracentesis. EXAM: ULTRASOUND GUIDED RIGHT THORACENTESIS MEDICATIONS: 10 mL 2% nesacaine Patient with documented allergy to lidocaine, recent tolerance of nesacaine injection 6/83/41 COMPLICATIONS: None immediate.  PROCEDURE: An ultrasound guided thoracentesis was thoroughly discussed with the patient and questions answered. The benefits, risks, alternatives and complications were also discussed. The patient understands and wishes to proceed with the procedure. Written consent was obtained. Ultrasound was performed to localize and mark an adequate pocket of fluid in the right chest. The area was then prepped and draped in the normal sterile fashion. 1% Lidocaine was used for local anesthesia. Under ultrasound guidance a 6 Fr Safe-T-Centesis catheter was introduced. Thoracentesis was performed. The catheter was removed and a dressing applied. FINDINGS: A total of approximately 700 mL of yellow, clear fluid was removed. Samples were sent to the laboratory as requested by the clinical team. IMPRESSION: Successful ultrasound guided right thoracentesis yielding 700 mL of pleural fluid. Read by: Brynda Greathouse PA-C Electronically Signed   By: Aletta Edouard M.D.   On: 04/17/2019 11:26   US THORACENTESIS ASP PLEURAL SPACE W/IMG GUIDE  Addendum Date: 04/08/2019   ADDENDUM REPORT: 04/08/2019 17:38 ADDENDUM: Note, Nesacaine was utilized for subcutaneous anesthesia purposes given history of lidocaine allergy. Patient tolerated this medication without incident. Electronically Signed   By: Sandi Mariscal M.D.   On: 04/08/2019 17:38   Result Date: 04/08/2019 INDICATION: Symptomatic right-sided sided pleural effusion EXAM: US THORACENTESIS ASP PLEURAL SPACE W/IMG GUIDE COMPARISON:  Chest radiograph-04/07/2019; chest CT-04/06/2018 MEDICATIONS: None. COMPLICATIONS: None immediate. TECHNIQUE: Informed written consent was obtained from the patient after a discussion of the risks, benefits and alternatives to treatment. A timeout was performed prior to the initiation of the procedure. Initial ultrasound scanning demonstrates a small anechoic right-sided pleural effusion. The lower chest was prepped and draped in the usual sterile fashion. 1%  lidocaine was used for local anesthesia. An ultrasound image was saved for documentation purposes. An 8 Fr Safe-T-Centesis catheter was introduced under ultrasound guidance. The thoracentesis was performed. The catheter was removed and a dressing was applied. The patient tolerated the procedure well without immediate post procedural complication. The patient was escorted to have an upright chest radiograph. FINDINGS: A total of approximately 450 cc of serous fluid was removed. Requested samples were sent to the laboratory. IMPRESSION: Successful ultrasound-guided right sided thoracentesis yielding 450 cc of pleural fluid. Electronically Signed: By: Sandi Mariscal M.D. On: 04/08/2019 17:33      ASSESSMENT/PLAN   Moderate Acute exacerbation of COPD  -Continue DuoNebs every 6 hours, we will add MetaNeb with hypertonic saline today to allow patient to expectorate thickened phlegm in preparation for discharge home.  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 -Patient has been started on Zithromax, rifampin, ethambutol -Appreciate infectious disease evaluation and recommendations -Have set up patient for afflo vest therapy to be used twice daily 20 to 30-minute sessions -Care management consultation for home PT -Pulmonary rehab on outpatient basis    Moderate right pleural effusion   -Contributing to hypoxemia -ultrasound-guided thoracentesis -Pleural fluid studies with ADA, AFB, pH, glucose, cytology, LDH, albumin, protein triglycerides,    Moderate protein calorie  malnutrition -Bitemporal wasting -Albumin 2.5 -Overall poor prognosis-will obtain palliative care consultation due to severe chronic lung disease with recurrent admissions for acute on chronic hypoxemia due to severe end-stage COPD with bullous emphysema, bronchiectasis, Mycobacterium avium complex     Thank you for allowing me to participate in the care of this patient.   Patient/Family are satisfied with care plan and all questions have been answered.  This document was prepared using Dragon voice recognition software and may include unintentional dictation errors.     FOttie Glazier M.D.  Division of [place]

## 2019-04-23 NOTE — Progress Notes (Signed)
New referral for [facility] community Palliative program to follow at home received from [facility].  Plan is for discharge home today with [facility]. Flo Shanks BSN, [place] [phone]

## 2019-04-23 NOTE — Progress Notes (Signed)
Received MD  order to discharge patient to home, reviled home meds, prescriptions discharge  instructions and follow up appointments with patient and patient verbalized understanding

## 2019-04-23 NOTE — Progress Notes (Signed)
Occupational Therapy Treatment Patient Details Name: Joseph Peters MRN: [id number] DOB: 1955/04/13 Today's Date: 04/23/2019    History of present illness Joseph Peters is a 39yoM who comes to [facility] on 04/16/19. 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 PMH: CAD, COPD, hypertension, AAA, pulmonary fibrosis. Chest  CT negative for PE.   OT comments  Pt seen to review safe use of O2 at home and follow through with breathing tech and energy conserv tech to use during ADLs.  Pt is making good progress with follow through of tech and is able to complete ADLs now with supervision and rest breaks only.  He continues to be on 3L of O2.  Will continue to monitor needs while in hospital but is meeting most goals set for him.  Rec OT HH to evaluate safety and set up at home for bathing and functional mobility and might not need this at discharge if he continues to progress.    Follow Up Recommendations  Home health OT;Supervision - Intermittent    Equipment Recommendations  Other (comment)    Recommendations for Other Services      Precautions / Restrictions Precautions Precautions: Fall Restrictions Weight Bearing Restrictions: No       Mobility Bed Mobility                  Transfers                      Balance                                           ADL either performed or assessed with clinical judgement   ADL Overall ADL's : Needs assistance/impaired                                       General ADL Comments: Pt seen to review safe use of O2 at home and follow through with breathing tech and energy conserv tech to use during ADLs.  Pt is making good progress with follow through of tech and is able to complete ADLs now with supervision and rest breaks only.  He continues to be on 3L of O2.     Vision Baseline Vision/History: Wears glasses Wears Glasses: Reading only     Perception     Praxis       Cognition Arousal/Alertness: Awake/alert Behavior During Therapy: WFL for tasks assessed/performed;Anxious Overall Cognitive Status: Within Functional Limits for tasks assessed                                          Exercises     Shoulder Instructions       General Comments      Pertinent Vitals/ Pain       Pain Assessment: No/denies pain  Home Living                                          Prior Functioning/Environment              Frequency  Min 2X/week  Progress Toward Goals  OT Goals(current goals can now be found in the care plan section)  Progress towards OT goals: Progressing toward goals  Acute Rehab OT Goals Patient Stated Goal: to get better, go home and not come back to hospital (at least for a long time) OT Goal Formulation: With patient Time For Goal Achievement: 05/01/19 Potential to Achieve Goals: Good  Plan Discharge plan remains appropriate    Co-evaluation                 AM-PAC OT "6 Clicks" Daily Activity     Outcome Measure   Help from another person eating meals?: None Help from another person taking care of personal grooming?: None Help from another person toileting, which includes using toliet, bedpan, or urinal?: A Little Help from another person bathing (including washing, rinsing, drying)?: A Little Help from another person to put on and taking off regular upper body clothing?: None Help from another person to put on and taking off regular lower body clothing?: None 6 Click Score: 22    End of Session    OT Visit Diagnosis: History of falling (Z91.81)   Activity Tolerance Patient tolerated treatment well   Patient Left in bed;with call bell/phone within reach;with nursing/sitter in room   Nurse Communication          Time: 1110-1135 OT Time Calculation (min): 25 min  Charges: OT General Charges $OT Visit: 1 Visit OT Treatments $Self Care/Home Management :  23-37 mins  Chrys Racer, OTR/L, [place] [facility] [phone] 04/23/19, 12:16 PM

## 2019-04-23 NOTE — Progress Notes (Signed)
Pt did not have the 02 on when I came into the room. His Sa02=86%. After the neb treatment it was placed back on and Sa02 increased.

## 2019-04-23 NOTE — TOC Progression Note (Signed)
Transition of Care [facility]) - Progression Note    Patient Details  Name: Joseph Peters MRN: [id number] Date of Birth: 19-Sep-1955  Transition of Care [facility]) CM/SW [place], RN Phone Number: 04/23/2019, 12:27 PM  Clinical Narrative:   Met with the patient to review the DC plan and needs He stated that he is concerned that he may not remember to take his medication at home,  I encouraged him to set reminders on his phone to remind him to take his medications I also encouraged him to have a family member call to remind him.  He stated that was a good idea and he would do that, he stated that he feels this would work well for him, I offered to set up with reminders on his phone for him and he declined and said his son would do it,  He [place] set up with [facility] for [facility] services, Alfo Vest is in the room to take with him and the Trilogy will be set up at home once discharged, he stated that he is ready to go home, I notified the physician         Expected Discharge Plan and Services                                                 Social Determinants of Health (SDOH) Interventions    Readmission Risk Interventions Readmission Risk Prevention Plan 04/21/2019  Transportation Screening Complete  PCP or Specialist Appt within 3-5 Days Complete  HRI or Home Care Consult Complete  Palliative Care Screening Complete  Medication Review (RN Care Manager) Referral to Pharmacy  Some recent data might be hidden

## 2019-04-23 NOTE — TOC Transition Note (Signed)
Transition of Care (TOC) - CM/SW Discharge Note The patient to discharge home today with [facility] for [facility] services, Has Aflo vest in the room to go home with him To get a tilogy delievere dat home once discharged, has Home O2 and DME NO additional Needs  Patient Details  Name: Joseph Peters MRN: [id number] Date of Birth: [birth year]  Transition of Care [facility]) CM/SW Contact:  Su Hilt, RN Phone Number: 04/23/2019, 3:32 PM   Clinical Narrative:       Final next level of care: [place] Barriers to Discharge: Barriers Resolved   Patient Goals and CMS Choice Patient states their goals for this hospitalization and ongoing recovery are:: go home      Discharge Placement                       Discharge Plan and Services                DME Arranged: NIV(Aflo Vest) DME Agency: [employer] Date DME Agency Contacted: 04/23/19 Time DME Agency Contacted: [id number] Representative spoke with at DME Agency: [place] HH Arranged: PT, RN, Nurse's Aide [place] Agency: [place] ([place]) Date [place]: 04/23/19 Time [facility]: 1532 Representative spoke with at [place]: [place] ([place]) Interventions     Readmission Risk Interventions Readmission Risk Prevention Plan 04/21/2019  Transportation Screening Complete  PCP or Specialist Appt within 3-5 Days Complete  HRI or [place] Complete  Palliative Care Screening Complete  Medication Review (RN Care Manager) Referral to Pharmacy  Some recent data might be hidden

## 2019-04-24 ENCOUNTER — Telehealth: Payer: Self-pay | Admitting: Primary Care

## 2019-04-24 LAB — ACID FAST SMEAR (AFB, MYCOBACTERIA): Acid Fast Smear: NEGATIVE

## 2019-04-24 LAB — TRIGLYCERIDES, BODY FLUIDS: Triglycerides, Fluid: <9 mg/dL

## 2019-04-24 NOTE — Discharge Summary (Signed)
D/c summary was miss labeled as a progress note on 04/23/19

## 2019-04-24 NOTE — Telephone Encounter (Signed)
Spoke with patient and patient's wife, Joseph Peters regarding Palliative services and all questions were answered and they were in agreement with starting Palliative services.  I have scheduled an In-home Consult for 05/05/19 @ 11 AM.

## 2019-04-25 LAB — CHOLESTEROL, BODY FLUID: Cholesterol, Fluid: 27 mg/dL

## 2019-04-26 LAB — BODY FLUID CULTURE: Culture: NO GROWTH

## 2019-04-27 ENCOUNTER — Encounter: Payer: Self-pay | Admitting: Emergency Medicine

## 2019-04-27 ENCOUNTER — Other Ambulatory Visit: Payer: Self-pay

## 2019-04-27 ENCOUNTER — Emergency Department
Admission: EM | Admit: 2019-04-27 | Discharge: 2019-04-27 | Disposition: A | Discharge status: Home / Self Care | Payer: Medicare Other | Source: Home / Self Care | Attending: Student in an Organized Health Care Education/Training Program | Admitting: Student in an Organized Health Care Education/Training Program

## 2019-04-27 ENCOUNTER — Emergency Department: Payer: Medicare Other

## 2019-04-27 DIAGNOSIS — J45909 Unspecified asthma, uncomplicated: Secondary | ICD-10-CM | POA: Insufficient documentation

## 2019-04-27 DIAGNOSIS — I10 Essential (primary) hypertension: Secondary | ICD-10-CM | POA: Insufficient documentation

## 2019-04-27 DIAGNOSIS — I2510 Atherosclerotic heart disease of native coronary artery without angina pectoris: Secondary | ICD-10-CM | POA: Insufficient documentation

## 2019-04-27 DIAGNOSIS — J441 Chronic obstructive pulmonary disease with (acute) exacerbation: Secondary | ICD-10-CM

## 2019-04-27 DIAGNOSIS — I252 Old myocardial infarction: Secondary | ICD-10-CM | POA: Insufficient documentation

## 2019-04-27 DIAGNOSIS — R0602 Shortness of breath: Secondary | ICD-10-CM | POA: Diagnosis not present

## 2019-04-27 DIAGNOSIS — Z8673 Personal history of transient ischemic attack (TIA), and cerebral infarction without residual deficits: Secondary | ICD-10-CM | POA: Insufficient documentation

## 2019-04-27 DIAGNOSIS — F17210 Nicotine dependence, cigarettes, uncomplicated: Secondary | ICD-10-CM | POA: Insufficient documentation

## 2019-04-27 DIAGNOSIS — Z79899 Other long term (current) drug therapy: Secondary | ICD-10-CM | POA: Insufficient documentation

## 2019-04-27 DIAGNOSIS — Z96641 Presence of right artificial hip joint: Secondary | ICD-10-CM | POA: Insufficient documentation

## 2019-04-27 DIAGNOSIS — A419 Sepsis, unspecified organism: Secondary | ICD-10-CM | POA: Diagnosis not present

## 2019-04-27 LAB — CBC WITH DIFFERENTIAL/PLATELET
Abs Immature Granulocytes: 0.02 10*3/uL (ref 0.00–0.07)
Basophils Absolute: 0.1 10*3/uL (ref 0.0–0.1)
Basophils Relative: 1 %
Eosinophils Absolute: 0.3 10*3/uL (ref 0.0–0.5)
Eosinophils Relative: 3 %
HCT: 40.2 % (ref 39.0–52.0)
Hemoglobin: 13 g/dL (ref 13.0–17.0)
Immature Granulocytes: 0 %
Lymphocytes Relative: 17 %
Lymphs Abs: 1.4 10*3/uL (ref 0.7–4.0)
MCH: 28.8 pg (ref 26.0–34.0)
MCHC: 32.3 g/dL (ref 30.0–36.0)
MCV: 89.1 fL (ref 80.0–100.0)
Monocytes Absolute: 0.8 10*3/uL (ref 0.1–1.0)
Monocytes Relative: 10 %
Neutro Abs: 5.5 10*3/uL (ref 1.7–7.7)
Neutrophils Relative %: 69 %
Platelets: 234 10*3/uL (ref 150–400)
RBC: 4.51 MIL/uL (ref 4.22–5.81)
RDW: 14.3 % (ref 11.5–15.5)
WBC: 8 10*3/uL (ref 4.0–10.5)
nRBC: 0 % (ref 0.0–0.2)

## 2019-04-27 LAB — COMPREHENSIVE METABOLIC PANEL
ALT: 12 U/L (ref 0–44)
AST: 16 U/L (ref 15–41)
Albumin: 3 g/dL — ABNORMAL LOW (ref 3.5–5.0)
Alkaline Phosphatase: 72 U/L (ref 38–126)
Anion gap: 7 (ref 5–15)
BUN: 14 mg/dL (ref 8–23)
CO2: 25 mmol/L (ref 22–32)
Calcium: 8.1 mg/dL — ABNORMAL LOW (ref 8.9–10.3)
Chloride: 104 mmol/L (ref 98–111)
Creatinine, Ser: 0.9 mg/dL (ref 0.61–1.24)
GFR calc Af Amer: >60 mL/min (ref >60)
GFR calc non Af Amer: >60 mL/min (ref >60)
Glucose, Bld: 88 mg/dL (ref 70–99)
Potassium: 4 mmol/L (ref 3.5–5.1)
Sodium: 136 mmol/L (ref 135–145)
Total Bilirubin: 0.8 mg/dL (ref 0.3–1.2)
Total Protein: 7 g/dL (ref 6.5–8.1)

## 2019-04-27 LAB — COMP PANEL: LEUKEMIA/LYMPHOMA

## 2019-04-27 LAB — BLOOD GAS, VENOUS
Acid-Base Excess: 0 mmol/L (ref 0.0–2.0)
Bicarbonate: 25.4 mmol/L (ref 20.0–28.0)
Delivery systems: POSITIVE
FIO2: 0.4
O2 Saturation: 38.5 %
Patient temperature: 37
pCO2, Ven: 43 mmHg — ABNORMAL LOW (ref 44.0–60.0)
pH, Ven: 7.38 (ref 7.250–7.430)

## 2019-04-27 LAB — TROPONIN I (HIGH SENSITIVITY): Troponin I (High Sensitivity): 15 ng/L (ref <18)

## 2019-04-27 MED ORDER — METHYLPREDNISOLONE SODIUM SUCC 125 MG IJ SOLR
60.0000 mg | Freq: Once | INTRAMUSCULAR | Status: AC
  Administered 2019-04-27: 14:00:00 60 mg via INTRAVENOUS
  Filled 2019-04-27: qty 2

## 2019-04-27 MED ORDER — IPRATROPIUM-ALBUTEROL 0.5-2.5 (3) MG/3ML IN SOLN
3.0000 mL | Freq: Once | RESPIRATORY_TRACT | Status: AC
  Administered 2019-04-27: 3 mL via RESPIRATORY_TRACT
  Filled 2019-04-27: qty 3

## 2019-04-27 MED ORDER — PREDNISONE 20 MG PO TABS: 40.0000 mg | ORAL_TABLET | Freq: Every day | ORAL | 0 refills | Status: DC

## 2019-04-27 NOTE — ED Triage Notes (Signed)
Pt via EMS from home. Pt c/o SOB and centralized CP since he was d/c on Friday. Pt was admitted for bilateral pneumonia and was d/c from the hospital this last Friday. Pt also has a productive cough. Pt describes color as dark brown mucus. Pt is chronically on 3L [place] of O2. EMS gave 2.5 of albuterol enroute. Pt A&Ox4 and NAD at this time.

## 2019-04-27 NOTE — ED Provider Notes (Signed)
[facility] Emergency Department Provider Note    First MD Initiated Contact with Patient 04/27/19 1308     (approximate)  I have reviewed the triage vital signs and the nursing notes.   HISTORY  Chief Complaint Shortness of Breath    HPI Joseph Peters is a 64 y.o. male extensive past medical history as listed below and recent admission the hospital for COPD exacerbation as well as being treated for MAC who presents to the ER for persistent shortness of breath.  States he had one episode of nausea and vomiting this morning.  Denies any abdominal pain.  No chest pain at this time.  Is still having productive cough.  States he been compliant with his medications.  Did feel significant improvement with nebulizer given by EMS.  Feels like he can hear himself wheezing.  Does wear 3 L nasal cannula and arrives satting 98% on 3 L.    Past Medical History:  Diagnosis Date  . AAA (abdominal aortic aneurysm) without rupture ([place])   . Abdominal aneurysm ([place]) 2015   being followed but not large enough to surgically treat  . Alcoholism ([place])   . Anxiety   . Arthritis   . Asthma   . Benign hypertension 07/16/2014  . Chest pain on exertion 07/16/2014  . Chronic pain syndrome   . Coccydynia 02/08/2012  . COPD (chronic obstructive pulmonary disease) ([place])    patient smokes  . Coronary artery disease   . 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 Depression   . 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 Dyspnea   . Frequent falls   . GERD (gastroesophageal reflux disease)   . Hyperlipidemia   . IBS (irritable bowel syndrome)   . Myocardial infarction ([place]) 2010  . Neuropathy   . Pneumonia   . Prostatitis   . Restless leg syndrome   . Sciatica   . Spinal stenosis of lumbar region   . Stroke ([place]) 2015   no residual symptoms  . Tremor, essential    Family History  Problem Relation Age of Onset  . Cancer Mother   . COPD  Father 14       black lung   Past Surgical History:  Procedure Laterality Date  . CARDIAC CATHETERIZATION  2012  . COLONOSCOPY WITH PROPOFOL N/A 11/14/2016   Procedure: COLONOSCOPY WITH PROPOFOL;  Surgeon: Manya Silvas, MD;  Location: [facility] ENDOSCOPY;  Service: Endoscopy;  Laterality: N/A;  . ESOPHAGOGASTRODUODENOSCOPY (EGD) WITH PROPOFOL N/A 11/14/2016   Procedure: ESOPHAGOGASTRODUODENOSCOPY (EGD) WITH PROPOFOL;  Surgeon: Manya Silvas, MD;  Location: [facility] ENDOSCOPY;  Service: Endoscopy;  Laterality: N/A;  . JOINT REPLACEMENT Right    hip  . KNEE SURGERY     right  . SHOULDER ARTHROSCOPY WITH OPEN ROTATOR CUFF REPAIR Right 08/20/2017   Procedure: SHOULDER ARTHROSCOPY WITH OPEN ROTATOR CUFF REPAIR;  Surgeon: Corky Mull, MD;  Location: [facility] ORS;  Service: Orthopedics;  Laterality: Right;  . SHOULDER ARTHROSCOPY WITH SUBACROMIAL DECOMPRESSION, ROTATOR CUFF REPAIR AND BICEP TENDON REPAIR Left 05/10/2016   Procedure: SHOULDER ARTHROSCOPY WITH DEBTRIDEMENT, DECOMPRESSION, REPAIR OF MASSIVE ROTATOR CUFF TEAR AND BICEP TENODESIS;  Surgeon: Corky Mull, MD;  Location: [facility] ORS;  Service: Orthopedics;  Laterality: Left;  Massive cuff tear  . TOTAL HIP ARTHROPLASTY Right 07/01/2018   Procedure: TOTAL HIP ARTHROPLASTY - RIGHT;  Surgeon: Corky Mull, MD;  Location: [facility] ORS;  Service: Orthopedics;  Laterality: Right;  .  VIDEO BRONCHOSCOPY WITH ENDOBRONCHIAL NAVIGATION N/A 02/13/2019   Procedure: VIDEO BRONCHOSCOPY WITH ENDOBRONCHIAL NAVIGATION;  Surgeon: Ottie Glazier, MD;  Location: [facility] ORS;  Service: Thoracic;  Laterality: N/A;  . VIDEO BRONCHOSCOPY WITH ENDOBRONCHIAL NAVIGATION N/A 03/13/2019   Procedure: VIDEO BRONCHOSCOPY WITH ENDOBRONCHIAL NAVIGATION;  Surgeon: Ottie Glazier, MD;  Location: [facility] ORS;  Service: Thoracic;  Laterality: N/A;  . VIDEO BRONCHOSCOPY WITH ENDOBRONCHIAL ULTRASOUND N/A 02/13/2019   Procedure: VIDEO BRONCHOSCOPY WITH ENDOBRONCHIAL ULTRASOUND;  Surgeon: Ottie Glazier, MD;  Location: [facility] ORS;  Service: Thoracic;  Laterality: N/A;  . VIDEO BRONCHOSCOPY WITH ENDOBRONCHIAL ULTRASOUND N/A 03/13/2019   Procedure: VIDEO BRONCHOSCOPY WITH ENDOBRONCHIAL ULTRASOUND;  Surgeon: Ottie Glazier, MD;  Location: [facility] ORS;  Service: Thoracic;  Laterality: N/A;   Patient Active Problem List   Diagnosis Date Noted  . History of MAC infection   . Pleural effusion on right   . SOB (shortness of breath)   . Anxiety about health   . Dyspnea   . Acute on chronic respiratory failure with hypoxia ([place])   . Goals of care, counseling/discussion   . Advanced care planning/counseling discussion   . Palliative care by specialist   . Restless legs syndrome (RLS) 04/01/2019  . Protein-calorie malnutrition, severe 10/15/2018  . Nodule of upper lobe of right lung 10/15/2018  . RLQ abdominal pain 10/14/2018  . Status post total hip replacement, right 07/01/2018  . Strain of right knee 06/26/2018  . Lumbar strain 06/16/2018  . Strain of right hip 06/16/2018  . Hyperlipidemia 01/30/2018  . Rotator cuff tendinitis, right 08/19/2017  . Traumatic complete tear of right rotator cuff 08/19/2017  . Ascending aortic aneurysm ([place]) 08/01/2017  . Tremor, essential 05/28/2017  . Gastroesophageal reflux disease without esophagitis 12/25/2016  . Renal artery stenosis ([place]) 12/05/2015  . 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 Syncope and collapse 12/05/2015  . Essential hypertension 10/15/2015  . COPD exacerbation ([place]) 01/31/2015  . Acute respiratory failure ([place]) 01/31/2015  . Drug allergy 01/30/2015  . AAA (abdominal aortic aneurysm) ([place]) 11/23/2014  . Prostate cancer screening 11/02/2014  . Gross hematuria 11/02/2014  . Benign hypertension 07/16/2014  . 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 Occlusion and stenosis of unspecified carotid artery 05/14/2012  . Coccydynia 02/08/2012  . Disorder of sacroiliac joint  02/08/2012  . Gait disorder 02/08/2012  . Spinal stenosis of lumbar region 01/13/2009  . Atherosclerotic heart disease of native coronary artery without angina pectoris 06/28/2008  . Sciatica 06/28/2008  . Recurrent major depressive disorder in partial remission ([place]) 06/28/2008      Prior to Admission medications   Medication Sig Start Date End Date Taking? Authorizing Provider  albuterol (PROVENTIL HFA;VENTOLIN HFA) 108 (90 Base) MCG/ACT inhaler Inhale 2 puffs into the lungs every 4 (four) hours as needed for wheezing or shortness of breath.     [provider]  azithromycin (ZITHROMAX) 500 MG tablet Take 1 tablet (500 mg total) by mouth daily. 04/11/19   Wyvonnia Dusky, MD  DALIRESP 250 MCG TABS Take 250 mcg by mouth at bedtime. 10/17/18   Stark Jock Jude, MD  ethambutol (MYAMBUTOL) 400 MG tablet Take 2 tablets (800 mg total) by mouth daily. 04/11/19   Wyvonnia Dusky, MD  finasteride (PROSCAR) 5 MG tablet Take 1 tablet (5 mg total) by mouth daily. 03/02/19   Festus Aloe, MD  FLUoxetine (PROZAC) 20 MG capsule Take 20 mg by mouth daily.     [provider]  Fluticasone-Umeclidin-Vilant (TRELEGY ELLIPTA) 100-62.5-25 MCG/INH AEPB Inhale 2 puffs into the lungs at bedtime.  08/18/18   [provider]  gabapentin (NEURONTIN) 300 MG capsule Take 900 mg by mouth at bedtime.     [provider]  losartan (COZAAR) 25 MG tablet Take 25 mg by mouth daily.     [provider]  metoprolol succinate (TOPROL-XL) 25 MG 24 hr tablet Take 1 tablet (25 mg total) by mouth daily. 04/12/19 05/12/19  Wyvonnia Dusky, MD  mirtazapine (REMERON) 7.5 MG tablet Take 1 tablet (7.5 mg total) by mouth at bedtime. 04/23/19 05/23/19  Wyvonnia Dusky, MD  montelukast (SINGULAIR) 10 MG tablet Take 10 mg by mouth at bedtime.    [provider]  Morphine Sulfate (MORPHINE CONCENTRATE) 10 MG/0.5ML SOLN concentrated solution Take 0.5 mLs (10 mg total) by mouth every 8  (eight) hours as needed for up to 5 days for severe pain or shortness of breath. 04/23/19 04/28/19  Wyvonnia Dusky, MD  omeprazole (PRILOSEC) 20 MG capsule Take 20 mg by mouth daily.    [provider]  ondansetron (ZOFRAN ODT) 4 MG disintegrating tablet Take 1 tablet (4 mg total) by mouth every 8 (eight) hours as needed for nausea or vomiting. 04/02/19   Tsosie Billing, MD  Oxycodone HCl 10 MG TABS Take 10 mg by mouth 4 (four) times daily as needed for pain. 09/29/18   [provider]  predniSONE (DELTASONE) 20 MG tablet Take 2 tablets (40 mg total) by mouth daily for 5 days. 04/27/19 05/02/19  Merlyn Lot, MD  rifampin (RIFADIN) 300 MG capsule Take 2 capsules (600 mg total) by mouth daily. 04/12/19 05/12/19  Wyvonnia Dusky, MD  rosuvastatin (CRESTOR) 5 MG tablet Take 5 mg by mouth at bedtime.    [provider]  tamsulosin (FLOMAX) 0.4 MG CAPS capsule Take 1 capsule (0.4 mg total) by mouth daily after supper. 12/22/18   Festus Aloe, MD    Allergies Penicillins and Lidocaine    Social History Social History   Tobacco Use  . Smoking status: Light Tobacco Smoker    Packs/day: 0.50    Years: 30.00    Pack years: 15.00    Types: Cigarettes    Start date: 02/26/1973    Last attempt to quit: 09/21/2018    Years since quitting: 0.5  . Smokeless tobacco: Former Systems developer    Quit date: 02/26/1998  . Tobacco comment: smokes 2-3 cig a week 02/12/19  Substance Use Topics  . Alcohol use: Yes    Alcohol/week: 20.0 standard drinks    Types: 20 Cans of beer per week    Comment: week   . Drug use: Yes    Comment: prescribed hydrocodone    Review of Systems Patient denies headaches, rhinorrhea, blurry vision, numbness, shortness of breath, chest pain, edema, cough, abdominal pain, nausea, vomiting, diarrhea, dysuria, fevers, rashes or hallucinations unless otherwise stated above in HPI. ____________________________________________   PHYSICAL EXAM:  VITAL  SIGNS: Vitals:   04/27/19 1430 04/27/19 1530  BP: (!) 139/92 125/90  Pulse: 85 84  Resp: (!) 23 16  Temp:    SpO2: 96% 94%    Constitutional: Alert and oriented.  Eyes: Conjunctivae are normal.  Head: Atraumatic. Nose: No congestion/rhinnorhea. Mouth/Throat: Mucous membranes are moist.   Neck: No stridor. Painless ROM.  Cardiovascular: Normal rate, regular rhythm. Grossly normal heart sounds.  Good peripheral circulation. Respiratory: minimal tachypnea,  Speaking in complete phrases,  Diffuse wheeze throughout Gastrointestinal:  Soft and nontender. No distention. No abdominal bruits. No CVA tenderness. Genitourinary:  Musculoskeletal: No lower extremity tenderness nor edema.  No joint effusions. Neurologic:  Normal speech and language. No gross focal neurologic deficits are appreciated. No facial droop Skin:  Skin is warm, dry and intact. No rash noted. Psychiatric: Mood and affect are normal. Speech and behavior are normal.  ____________________________________________   LABS (all labs ordered are listed, but only abnormal results are displayed)  Results for orders placed or performed during the hospital encounter of 04/27/19 (from the past 24 hour(s))  CBC with Differential/Platelet     Status: None   Collection Time: 04/27/19  1:23 PM  Result Value Ref Range   WBC 8.0 4.0 - 10.5 K/uL   RBC 4.51 4.22 - 5.81 MIL/uL   Hemoglobin 13.0 13.0 - 17.0 g/dL   HCT 40.2 39.0 - 52.0 %   MCV 89.1 80.0 - 100.0 fL   MCH 28.8 26.0 - 34.0 pg   MCHC 32.3 30.0 - 36.0 g/dL   RDW 14.3 11.5 - 15.5 %   Platelets 234 150 - 400 K/uL   nRBC 0.0 0.0 - 0.2 %   Neutrophils Relative % 69 %   Neutro Abs 5.5 1.7 - 7.7 K/uL   Lymphocytes Relative 17 %   Lymphs Abs 1.4 0.7 - 4.0 K/uL   Monocytes Relative 10 %   Monocytes Absolute 0.8 0.1 - 1.0 K/uL   Eosinophils Relative 3 %   Eosinophils Absolute 0.3 0.0 - 0.5 K/uL   Basophils Relative 1 %   Basophils Absolute 0.1 0.0 - 0.1 K/uL   Immature  Granulocytes 0 %   Abs Immature Granulocytes 0.02 0.00 - 0.07 K/uL  Comprehensive metabolic panel     Status: Abnormal   Collection Time: 04/27/19  1:23 PM  Result Value Ref Range   Sodium 136 135 - 145 mmol/L   Potassium 4.0 3.5 - 5.1 mmol/L   Chloride 104 98 - 111 mmol/L   CO2 25 22 - 32 mmol/L   Glucose, Bld 88 70 - 99 mg/dL   BUN 14 8 - 23 mg/dL   Creatinine, Ser 0.90 0.61 - 1.24 mg/dL   Calcium 8.1 (L) 8.9 - 10.3 mg/dL   Total Protein 7.0 6.5 - 8.1 g/dL   Albumin 3.0 (L) 3.5 - 5.0 g/dL   AST 16 15 - 41 U/L   ALT 12 0 - 44 U/L   Alkaline Phosphatase 72 38 - 126 U/L   Total Bilirubin 0.8 0.3 - 1.2 mg/dL   GFR calc non Af Amer >60 >60 mL/min   GFR calc Af Amer >60 >60 mL/min   Anion gap 7 5 - 15  Troponin I (High Sensitivity)     Status: None   Collection Time: 04/27/19  1:23 PM  Result Value Ref Range   Troponin I (High Sensitivity) 15 <18 ng/L   ____________________________________________  EKG My review and personal interpretation at Time: 13:16   Indication: cp   Rate: 80  Rhythm: sinus Axis: left Other: inferolateral t wave inversions, no stemi criteria, no significant changes as compared to previous tracing ____________________________________________  RADIOLOGY  Prim  ____________________________________________   PROCEDURES  Procedure(s) performed:  Procedures    Critical Care performed: no ____________________________________________   INITIAL IMPRESSION / ASSESSMENT AND PLAN / ED COURSE  Pertinent labs & imaging results that were available during my care of the patient were reviewed by me and considered in my medical decision making (see chart for details).  DDX: copd, pna, chf, ptx, pe,   BERYLE ZEITZ is a 64 y.o. who presents to the ED with symptoms as described above.  Patient nontoxic-appearing.  Does have diffuse wheezing but did feel improvement after nebulizer.  Will give steroid and additional neb and observe.  He is not having any  hypoxia.  He speaking in complete sentences.  Clinical Course as of Apr 26 1629  Mon Apr 27, 2019  1532 Patient reassessed.  Significant provement after nebulizer.  Patient feels well at this point.  We will continue to observe and make sure is not having rebound tachypnea but anticipate patient will be appropriate for discharge home.   [PR]  1627 Patient remains pain free.  Trop negative.  Given duration of symptoms do not feel further obs or repeat trop clinically indicated.  Does not seem consistent with ACS or dissection or PE.  He is satting well.  Feels significantly improved after treatments here in the ER.  Certainly well aware of his underlying COPD.  Has persistent infiltrate.  Not meeting sepsis criteria.  I think this is probably evolution of known disease process.  Do not feel that broadening antibiotics clinically indicated at this time.  He has follow-up with ID as well as pulmonology. Have discussed with the patient and available family all diagnostics and treatments performed thus far and all questions were answered to the best of my ability. The patient demonstrates understanding and agreement with plan.    [PR]    Clinical Course User Index [PR] Merlyn Lot, MD    The patient was evaluated in Emergency Department today for the symptoms described in the history of present illness. He/she was evaluated in the context of the global COVID-19 pandemic, which necessitated consideration that the patient might be at risk for infection with the SARS-CoV-2 virus that causes COVID-19. Institutional protocols and algorithms that pertain to the evaluation of patients at risk for COVID-19 are in a state of rapid change based on information released by regulatory bodies including the CDC and federal and state organizations. These policies and algorithms were followed during the patient's care in the ED.  As part of my medical decision making, I reviewed the following data within the  [place] notes reviewed and incorporated, Labs reviewed, notes from prior ED visits and [place] Controlled Substance Database   ____________________________________________   FINAL CLINICAL IMPRESSION(S) / ED DIAGNOSES  Final diagnoses:  COPD exacerbation ([place])      NEW MEDICATIONS STARTED DURING THIS VISIT:  New Prescriptions   PREDNISONE (DELTASONE) 20 MG TABLET    Take 2 tablets (40 mg total) by mouth daily for 5 days.     Note:  This document was prepared using Dragon voice recognition software and may include unintentional dictation errors.    Merlyn Lot, MD 04/27/19 [phone]

## 2019-04-28 ENCOUNTER — Inpatient Hospital Stay
Admission: EM | Admit: 2019-04-28 | Discharge: 2019-05-03 | DRG: 871 | Disposition: A | Discharge status: Home Health | Payer: Medicare Other | Attending: Internal Medicine | Admitting: Internal Medicine

## 2019-04-28 ENCOUNTER — Other Ambulatory Visit: Payer: Self-pay

## 2019-04-28 ENCOUNTER — Emergency Department: Payer: Medicare Other

## 2019-04-28 DIAGNOSIS — I10 Essential (primary) hypertension: Secondary | ICD-10-CM | POA: Diagnosis not present

## 2019-04-28 DIAGNOSIS — R7989 Other specified abnormal findings of blood chemistry: Secondary | ICD-10-CM | POA: Diagnosis present

## 2019-04-28 DIAGNOSIS — I252 Old myocardial infarction: Secondary | ICD-10-CM

## 2019-04-28 DIAGNOSIS — A419 Sepsis, unspecified organism: Secondary | ICD-10-CM | POA: Diagnosis not present

## 2019-04-28 DIAGNOSIS — Z809 Family history of malignant neoplasm, unspecified: Secondary | ICD-10-CM | POA: Diagnosis not present

## 2019-04-28 DIAGNOSIS — Z96641 Presence of right artificial hip joint: Secondary | ICD-10-CM | POA: Diagnosis present

## 2019-04-28 DIAGNOSIS — K219 Gastro-esophageal reflux disease without esophagitis: Secondary | ICD-10-CM | POA: Diagnosis not present

## 2019-04-28 DIAGNOSIS — I712 Thoracic aortic aneurysm, without rupture: Secondary | ICD-10-CM | POA: Diagnosis present

## 2019-04-28 DIAGNOSIS — Y95 Nosocomial condition: Secondary | ICD-10-CM | POA: Diagnosis present

## 2019-04-28 DIAGNOSIS — M1990 Unspecified osteoarthritis, unspecified site: Secondary | ICD-10-CM | POA: Diagnosis present

## 2019-04-28 DIAGNOSIS — G894 Chronic pain syndrome: Secondary | ICD-10-CM | POA: Diagnosis present

## 2019-04-28 DIAGNOSIS — R778 Other specified abnormalities of plasma proteins: Secondary | ICD-10-CM

## 2019-04-28 DIAGNOSIS — R0902 Hypoxemia: Secondary | ICD-10-CM

## 2019-04-28 DIAGNOSIS — Z880 Allergy status to penicillin: Secondary | ICD-10-CM | POA: Diagnosis not present

## 2019-04-28 DIAGNOSIS — F32A Depression, unspecified: Secondary | ICD-10-CM | POA: Diagnosis present

## 2019-04-28 DIAGNOSIS — Z79899 Other long term (current) drug therapy: Secondary | ICD-10-CM | POA: Diagnosis not present

## 2019-04-28 DIAGNOSIS — G2581 Restless legs syndrome: Secondary | ICD-10-CM | POA: Diagnosis present

## 2019-04-28 DIAGNOSIS — F329 Major depressive disorder, single episode, unspecified: Secondary | ICD-10-CM | POA: Diagnosis present

## 2019-04-28 DIAGNOSIS — M48061 Spinal stenosis, lumbar region without neurogenic claudication: Secondary | ICD-10-CM | POA: Diagnosis present

## 2019-04-28 DIAGNOSIS — J441 Chronic obstructive pulmonary disease with (acute) exacerbation: Secondary | ICD-10-CM | POA: Diagnosis present

## 2019-04-28 DIAGNOSIS — J439 Emphysema, unspecified: Secondary | ICD-10-CM | POA: Diagnosis present

## 2019-04-28 DIAGNOSIS — Z20822 Contact with and (suspected) exposure to covid-19: Secondary | ICD-10-CM | POA: Diagnosis present

## 2019-04-28 DIAGNOSIS — G629 Polyneuropathy, unspecified: Secondary | ICD-10-CM | POA: Diagnosis present

## 2019-04-28 DIAGNOSIS — I5023 Acute on chronic systolic (congestive) heart failure: Secondary | ICD-10-CM | POA: Diagnosis not present

## 2019-04-28 DIAGNOSIS — Z9119 Patient's noncompliance with other medical treatment and regimen: Secondary | ICD-10-CM

## 2019-04-28 DIAGNOSIS — Z8673 Personal history of transient ischemic attack (TIA), and cerebral infarction without residual deficits: Secondary | ICD-10-CM

## 2019-04-28 DIAGNOSIS — N400 Enlarged prostate without lower urinary tract symptoms: Secondary | ICD-10-CM | POA: Diagnosis present

## 2019-04-28 DIAGNOSIS — E785 Hyperlipidemia, unspecified: Secondary | ICD-10-CM | POA: Diagnosis not present

## 2019-04-28 DIAGNOSIS — J189 Pneumonia, unspecified organism: Secondary | ICD-10-CM | POA: Diagnosis not present

## 2019-04-28 DIAGNOSIS — Z8619 Personal history of other infectious and parasitic diseases: Secondary | ICD-10-CM | POA: Diagnosis present

## 2019-04-28 DIAGNOSIS — F17210 Nicotine dependence, cigarettes, uncomplicated: Secondary | ICD-10-CM | POA: Diagnosis present

## 2019-04-28 DIAGNOSIS — I429 Cardiomyopathy, unspecified: Secondary | ICD-10-CM | POA: Diagnosis present

## 2019-04-28 DIAGNOSIS — J9621 Acute and chronic respiratory failure with hypoxia: Secondary | ICD-10-CM | POA: Diagnosis present

## 2019-04-28 DIAGNOSIS — I110 Hypertensive heart disease with heart failure: Secondary | ICD-10-CM | POA: Diagnosis present

## 2019-04-28 DIAGNOSIS — Z884 Allergy status to anesthetic agent status: Secondary | ICD-10-CM | POA: Diagnosis not present

## 2019-04-28 DIAGNOSIS — I2510 Atherosclerotic heart disease of native coronary artery without angina pectoris: Secondary | ICD-10-CM | POA: Diagnosis present

## 2019-04-28 DIAGNOSIS — R0602 Shortness of breath: Secondary | ICD-10-CM

## 2019-04-28 DIAGNOSIS — Z836 Family history of other diseases of the respiratory system: Secondary | ICD-10-CM | POA: Diagnosis not present

## 2019-04-28 DIAGNOSIS — Z9981 Dependence on supplemental oxygen: Secondary | ICD-10-CM

## 2019-04-28 LAB — CBC
HCT: 40.1 % (ref 39.0–52.0)
Hemoglobin: 13.2 g/dL (ref 13.0–17.0)
MCH: 29.1 pg (ref 26.0–34.0)
MCHC: 32.9 g/dL (ref 30.0–36.0)
MCV: 88.5 fL (ref 80.0–100.0)
Platelets: 243 10*3/uL (ref 150–400)
RBC: 4.53 MIL/uL (ref 4.22–5.81)
RDW: 14.2 % (ref 11.5–15.5)
WBC: 12.4 10*3/uL — ABNORMAL HIGH (ref 4.0–10.5)
nRBC: 0 % (ref 0.0–0.2)

## 2019-04-28 LAB — PROCALCITONIN: Procalcitonin: <0.1 ng/mL

## 2019-04-28 LAB — BASIC METABOLIC PANEL
Anion gap: 11 (ref 5–15)
BUN: 16 mg/dL (ref 8–23)
CO2: 21 mmol/L — ABNORMAL LOW (ref 22–32)
Calcium: 8.4 mg/dL — ABNORMAL LOW (ref 8.9–10.3)
Chloride: 103 mmol/L (ref 98–111)
Creatinine, Ser: 1.02 mg/dL (ref 0.61–1.24)
GFR calc Af Amer: >60 mL/min (ref >60)
GFR calc non Af Amer: >60 mL/min (ref >60)
Glucose, Bld: 120 mg/dL — ABNORMAL HIGH (ref 70–99)
Potassium: 3.7 mmol/L (ref 3.5–5.1)
Sodium: 135 mmol/L (ref 135–145)

## 2019-04-28 LAB — BLOOD GAS, VENOUS
Acid-base deficit: 2.3 mmol/L — ABNORMAL HIGH (ref 0.0–2.0)
Acid-base deficit: 5.6 mmol/L — ABNORMAL HIGH (ref 0.0–2.0)
Bicarbonate: 21.2 mmol/L (ref 20.0–28.0)
Bicarbonate: 19.3 mmol/L — ABNORMAL LOW (ref 20.0–28.0)
O2 Saturation: 90.1 %
O2 Saturation: 82 %
Patient temperature: 37
Patient temperature: 37
pCO2, Ven: 32 mmHg — ABNORMAL LOW (ref 44.0–60.0)
pCO2, Ven: 35 mmHg — ABNORMAL LOW (ref 44.0–60.0)
pH, Ven: 7.43 (ref 7.250–7.430)
pH, Ven: 7.35 (ref 7.250–7.430)
pO2, Ven: 57 mmHg — ABNORMAL HIGH (ref 32.0–45.0)
pO2, Ven: 49 mmHg — ABNORMAL HIGH (ref 32.0–45.0)

## 2019-04-28 LAB — TROPONIN I (HIGH SENSITIVITY)
Troponin I (High Sensitivity): 77 ng/L — ABNORMAL HIGH (ref <18)
Troponin I (High Sensitivity): 75 ng/L — ABNORMAL HIGH (ref <18)
Troponin I (High Sensitivity): 80 ng/L — ABNORMAL HIGH (ref <18)
Troponin I (High Sensitivity): 86 ng/L — ABNORMAL HIGH (ref <18)
Troponin I (High Sensitivity): 69 ng/L — ABNORMAL HIGH (ref <18)

## 2019-04-28 LAB — LACTIC ACID, PLASMA
Lactic Acid, Venous: 2.2 mmol/L (ref 0.5–1.9)
Lactic Acid, Venous: 1.8 mmol/L (ref 0.5–1.9)

## 2019-04-28 LAB — GLUCOSE, CAPILLARY: Glucose-Capillary: 116 mg/dL — ABNORMAL HIGH (ref 70–99)

## 2019-04-28 LAB — RESPIRATORY PANEL BY RT PCR (FLU A&B, COVID)
Influenza A by PCR: NEGATIVE
Influenza B by PCR: NEGATIVE
SARS Coronavirus 2 by RT PCR: NEGATIVE

## 2019-04-28 LAB — BRAIN NATRIURETIC PEPTIDE: B Natriuretic Peptide: >4500 pg/mL — ABNORMAL HIGH (ref 0.0–100.0)

## 2019-04-28 MED ORDER — FLUTICASONE FUROATE-VILANTEROL 100-25 MCG/INH IN AEPB
2.0000 | INHALATION_SPRAY | Freq: Every day | RESPIRATORY_TRACT | Status: DC
  Administered 2019-04-28 – 2019-05-02 (×5): 2 via RESPIRATORY_TRACT
  Filled 2019-04-28: qty 28

## 2019-04-28 MED ORDER — ALBUTEROL SULFATE (2.5 MG/3ML) 0.083% IN NEBU: 2.5000 mg | INHALATION_SOLUTION | RESPIRATORY_TRACT | Status: DC | PRN

## 2019-04-28 MED ORDER — TAMSULOSIN HCL 0.4 MG PO CAPS
0.4000 mg | ORAL_CAPSULE | Freq: Every day | ORAL | Status: DC
  Administered 2019-04-28 – 2019-05-02 (×5): 0.4 mg via ORAL
  Filled 2019-04-28 (×5): qty 1

## 2019-04-28 MED ORDER — IPRATROPIUM-ALBUTEROL 0.5-2.5 (3) MG/3ML IN SOLN
3.0000 mL | RESPIRATORY_TRACT | Status: DC
  Administered 2019-04-28 – 2019-04-29 (×4): 3 mL via RESPIRATORY_TRACT
  Filled 2019-04-28 (×5): qty 3

## 2019-04-28 MED ORDER — METOPROLOL SUCCINATE ER 50 MG PO TB24
25.0000 mg | ORAL_TABLET | Freq: Every day | ORAL | Status: DC
  Administered 2019-04-28 – 2019-04-30 (×3): 25 mg via ORAL
  Filled 2019-04-28 (×3): qty 1

## 2019-04-28 MED ORDER — THIAMINE HCL 100 MG PO TABS
100.0000 mg | ORAL_TABLET | Freq: Every day | ORAL | Status: DC
  Administered 2019-04-29 – 2019-05-03 (×5): 100 mg via ORAL
  Filled 2019-04-28 (×5): qty 1

## 2019-04-28 MED ORDER — UMECLIDINIUM BROMIDE 62.5 MCG/INH IN AEPB
2.0000 | INHALATION_SPRAY | Freq: Every day | RESPIRATORY_TRACT | Status: DC
  Administered 2019-04-28 – 2019-04-29 (×2): 2 via RESPIRATORY_TRACT
  Filled 2019-04-28: qty 7

## 2019-04-28 MED ORDER — SODIUM CHLORIDE 0.9 % IV SOLN
2.0000 g | Freq: Once | INTRAVENOUS | Status: AC
  Administered 2019-04-28: 2 g via INTRAVENOUS
  Filled 2019-04-28: qty 20

## 2019-04-28 MED ORDER — ROSUVASTATIN CALCIUM 5 MG PO TABS
5.0000 mg | ORAL_TABLET | Freq: Every day | ORAL | Status: DC
  Administered 2019-04-28 – 2019-05-02 (×5): 5 mg via ORAL
  Filled 2019-04-28 (×6): qty 1

## 2019-04-28 MED ORDER — ADULT MULTIVITAMIN W/MINERALS CH
1.0000 | ORAL_TABLET | Freq: Every day | ORAL | Status: DC
  Administered 2019-04-29 – 2019-05-03 (×5): 1 via ORAL
  Filled 2019-04-28 (×6): qty 1

## 2019-04-28 MED ORDER — LOSARTAN POTASSIUM 50 MG PO TABS
25.0000 mg | ORAL_TABLET | Freq: Every day | ORAL | Status: DC
  Administered 2019-04-28 – 2019-04-30 (×3): 25 mg via ORAL
  Filled 2019-04-28 (×4): qty 1

## 2019-04-28 MED ORDER — PANTOPRAZOLE SODIUM 40 MG PO TBEC
40.0000 mg | DELAYED_RELEASE_TABLET | Freq: Every day | ORAL | Status: DC
  Administered 2019-04-28 – 2019-05-03 (×6): 40 mg via ORAL
  Filled 2019-04-28 (×6): qty 1

## 2019-04-28 MED ORDER — FLUOXETINE HCL 20 MG PO CAPS
20.0000 mg | ORAL_CAPSULE | Freq: Every day | ORAL | Status: DC
  Administered 2019-04-28 – 2019-05-03 (×6): 20 mg via ORAL
  Filled 2019-04-28 (×6): qty 1

## 2019-04-28 MED ORDER — SODIUM CHLORIDE 0.9 % IV SOLN
500.0000 mg | Freq: Once | INTRAVENOUS | Status: AC
  Administered 2019-04-28: 500 mg via INTRAVENOUS
  Filled 2019-04-28: qty 500

## 2019-04-28 MED ORDER — NICOTINE 21 MG/24HR TD PT24
21.0000 mg | MEDICATED_PATCH | TRANSDERMAL | Status: DC
  Administered 2019-05-02: 21 mg via TRANSDERMAL
  Filled 2019-04-28 (×4): qty 1

## 2019-04-28 MED ORDER — ENOXAPARIN SODIUM 40 MG/0.4ML ~~LOC~~ SOLN
40.0000 mg | SUBCUTANEOUS | Status: DC
  Administered 2019-04-28 – 2019-05-02 (×5): 40 mg via SUBCUTANEOUS
  Filled 2019-04-28 (×5): qty 0.4

## 2019-04-28 MED ORDER — LORAZEPAM 2 MG/ML IJ SOLN
0.0000 mg | Freq: Four times a day (QID) | INTRAMUSCULAR | Status: DC
  Administered 2019-04-28: 2 mg via INTRAVENOUS
  Administered 2019-04-29: 1 mg via INTRAVENOUS
  Filled 2019-04-28 (×4): qty 1

## 2019-04-28 MED ORDER — ROFLUMILAST 500 MCG PO TABS
250.0000 ug | ORAL_TABLET | Freq: Every day | ORAL | Status: DC
  Administered 2019-04-28 – 2019-05-02 (×5): 250 ug via ORAL
  Filled 2019-04-28 (×6): qty 1

## 2019-04-28 MED ORDER — ETHAMBUTOL HCL 400 MG PO TABS
800.0000 mg | ORAL_TABLET | Freq: Every day | ORAL | Status: DC
  Administered 2019-04-28 – 2019-05-03 (×6): 800 mg via ORAL
  Filled 2019-04-28 (×6): qty 2

## 2019-04-28 MED ORDER — SODIUM CHLORIDE 0.9 % IV SOLN
2.0000 g | Freq: Two times a day (BID) | INTRAVENOUS | Status: DC
  Administered 2019-04-28 – 2019-05-03 (×10): 2 g via INTRAVENOUS
  Filled 2019-04-28 (×14): qty 2

## 2019-04-28 MED ORDER — THIAMINE HCL 100 MG/ML IJ SOLN
100.0000 mg | Freq: Every day | INTRAMUSCULAR | Status: DC
  Administered 2019-04-28: 100 mg via INTRAVENOUS
  Filled 2019-04-28: qty 2

## 2019-04-28 MED ORDER — FLUTICASONE-UMECLIDIN-VILANT 100-62.5-25 MCG/INH IN AEPB: 2.0000 | INHALATION_SPRAY | Freq: Every day | RESPIRATORY_TRACT | Status: DC

## 2019-04-28 MED ORDER — VANCOMYCIN HCL 750 MG/150ML IV SOLN
750.0000 mg | INTRAVENOUS | Status: DC
  Administered 2019-04-29 – 2019-04-30 (×2): 750 mg via INTRAVENOUS
  Filled 2019-04-28 (×3): qty 150

## 2019-04-28 MED ORDER — FUROSEMIDE 10 MG/ML IJ SOLN
20.0000 mg | Freq: Two times a day (BID) | INTRAMUSCULAR | Status: DC
  Administered 2019-04-28 – 2019-04-30 (×4): 20 mg via INTRAVENOUS
  Filled 2019-04-28 (×3): qty 2

## 2019-04-28 MED ORDER — IPRATROPIUM-ALBUTEROL 0.5-2.5 (3) MG/3ML IN SOLN
3.0000 mL | Freq: Once | RESPIRATORY_TRACT | Status: AC
  Administered 2019-04-28: 3 mL via RESPIRATORY_TRACT
  Filled 2019-04-28: qty 3

## 2019-04-28 MED ORDER — FUROSEMIDE 10 MG/ML IJ SOLN
20.0000 mg | Freq: Once | INTRAMUSCULAR | Status: AC
  Administered 2019-04-28: 14:00:00 20 mg via INTRAVENOUS
  Filled 2019-04-28: qty 4

## 2019-04-28 MED ORDER — RIFAMPIN 300 MG PO CAPS
600.0000 mg | ORAL_CAPSULE | Freq: Every day | ORAL | Status: DC
  Administered 2019-04-28 – 2019-05-03 (×6): 600 mg via ORAL
  Filled 2019-04-28 (×7): qty 2

## 2019-04-28 MED ORDER — METHYLPREDNISOLONE SODIUM SUCC 40 MG IJ SOLR
40.0000 mg | Freq: Two times a day (BID) | INTRAMUSCULAR | Status: DC
  Administered 2019-04-28 – 2019-04-29 (×2): 40 mg via INTRAVENOUS
  Filled 2019-04-28 (×2): qty 1

## 2019-04-28 MED ORDER — ASPIRIN EC 81 MG PO TBEC
81.0000 mg | DELAYED_RELEASE_TABLET | Freq: Every day | ORAL | Status: DC
  Administered 2019-04-28 – 2019-05-03 (×6): 81 mg via ORAL
  Filled 2019-04-28 (×5): qty 1

## 2019-04-28 MED ORDER — DM-GUAIFENESIN ER 30-600 MG PO TB12
1.0000 | ORAL_TABLET | Freq: Two times a day (BID) | ORAL | Status: DC
  Administered 2019-04-28 – 2019-05-03 (×10): 1 via ORAL
  Filled 2019-04-28 (×10): qty 1

## 2019-04-28 MED ORDER — MIRTAZAPINE 15 MG PO TABS
7.5000 mg | ORAL_TABLET | Freq: Every day | ORAL | Status: DC
  Administered 2019-04-28 – 2019-05-02 (×5): 7.5 mg via ORAL
  Filled 2019-04-28 (×6): qty 1

## 2019-04-28 MED ORDER — GABAPENTIN 300 MG PO CAPS
900.0000 mg | ORAL_CAPSULE | Freq: Every day | ORAL | Status: DC
  Administered 2019-04-28 – 2019-05-02 (×5): 900 mg via ORAL
  Filled 2019-04-28 (×5): qty 3

## 2019-04-28 MED ORDER — FOLIC ACID 1 MG PO TABS
1.0000 mg | ORAL_TABLET | Freq: Every day | ORAL | Status: DC
  Administered 2019-04-29 – 2019-05-03 (×5): 1 mg via ORAL
  Filled 2019-04-28 (×5): qty 1

## 2019-04-28 MED ORDER — LORAZEPAM 2 MG/ML IJ SOLN
1.0000 mg | INTRAMUSCULAR | Status: DC | PRN
  Administered 2019-04-29: 1 mg via INTRAVENOUS

## 2019-04-28 MED ORDER — IOHEXOL 350 MG/ML SOLN
75.0000 mL | Freq: Once | INTRAVENOUS | Status: AC | PRN
  Administered 2019-04-28: 75 mL via INTRAVENOUS

## 2019-04-28 MED ORDER — LORAZEPAM 2 MG/ML IJ SOLN: 0.0000 mg | Freq: Two times a day (BID) | INTRAMUSCULAR | Status: DC

## 2019-04-28 MED ORDER — LORAZEPAM 1 MG PO TABS: 1.0000 mg | ORAL_TABLET | ORAL | Status: DC | PRN

## 2019-04-28 MED ORDER — FINASTERIDE 5 MG PO TABS
5.0000 mg | ORAL_TABLET | Freq: Every day | ORAL | Status: DC
  Administered 2019-04-29 – 2019-05-03 (×5): 5 mg via ORAL
  Filled 2019-04-28 (×5): qty 1

## 2019-04-28 MED ORDER — MONTELUKAST SODIUM 10 MG PO TABS
10.0000 mg | ORAL_TABLET | Freq: Every day | ORAL | Status: DC
  Administered 2019-04-28 – 2019-05-02 (×5): 10 mg via ORAL
  Filled 2019-04-28 (×5): qty 1

## 2019-04-28 MED ORDER — VANCOMYCIN HCL IN DEXTROSE 1-5 GM/200ML-% IV SOLN
1000.0000 mg | INTRAVENOUS | Status: AC
  Administered 2019-04-28: 1000 mg via INTRAVENOUS
  Filled 2019-04-28: qty 200

## 2019-04-28 MED ORDER — OXYCODONE HCL 5 MG PO TABS
10.0000 mg | ORAL_TABLET | Freq: Four times a day (QID) | ORAL | Status: DC | PRN
  Administered 2019-04-28 – 2019-05-01 (×3): 10 mg via ORAL
  Filled 2019-04-28 (×3): qty 2

## 2019-04-28 NOTE — ED Notes (Signed)
This Rn atetnded to call bell. Pt st "I can't breath well". This RN notified MD Monks at this time.

## 2019-04-28 NOTE — ED Notes (Signed)
Date and time results received: 04/28/19 1110   Test: lactic acid Critical Value: 2.2  Name of Provider Notified: Dr. Joan Mayans

## 2019-04-28 NOTE — Progress Notes (Signed)
Pt wanted a break from bipap for a little while, placed on 100% non rebreather, tolerating well, stated he would like to go back on later.

## 2019-04-28 NOTE — ED Notes (Signed)
RT called by this RN.

## 2019-04-28 NOTE — ED Triage Notes (Signed)
Pt arrives to ED via [facility] from home. Usually on 3 L. Was 85% on 3L. Pt was placed on non-rebreather and came up to 100%. Seen yesterday for SOB. Dx with double pneumonia. Hx of same. Been taking antibiotics for a month for same. Hx HTN (no meds for that today, taken yesterday), COPD. Pt gave himself a breathing treatment at home before EMS arrival.

## 2019-04-28 NOTE — Plan of Care (Signed)
Pt admitted to room ICU 19, A&O, understanding of POC verbalized, oriented to room.  NRB at 15 L for now, taking a break from Bi Pap, he has called his wife

## 2019-04-28 NOTE — Consult Note (Signed)
Pharmacy Antibiotic Note  Joseph Peters is a 64 y.o. male admitted on 04/28/2019 with shortness of breath. Patient recently admitted with COPD exacerbation and PMH of MAC. Pharmacy has been consulted for cefepime and vancomycin dosing.  WBC: 12.4   PCT: <0.10   Scr: 1.02   Plan: 1. Vancomycin 1g IV x 1 dose followed by Vancomycin 750 mg IV Q 24 hrs. Goal AUC 400-550. Expected AUC:431 SCr used: 10  2. Start Cefepime 2g IV every 12 hours based on current CrCl < 41m/min.   Height: _0  (165.1 cm) Weight: 110 lb (49.9 kg) IBW/kg (Calculated) : 61.5  Temp (24hrs), Avg:98.2 F (36.8 C), Min:97.8 F (36.6 C), Max:98.5 F (36.9 C)  Recent Labs  Lab 04/22/19 0403 04/23/19 0504 04/27/19 1323 04/28/19 1037 04/28/19 1302  WBC 9.2 8.9 8.0 12.4*  --   CREATININE  --  0.92 0.90 1.02  --   LATICACIDVEN  --   --   --  2.2* 1.8    Estimated Creatinine Clearance: 53 mL/min (by C-G formula based on SCr of 1.02 mg/dL).    Allergies  Allergen Reactions  . Penicillins Swelling, Rash and Other (See Comments)    Swelling in throat  Did it involve swelling of the face/tongue/throat, SOB, or low BP? yes Did it involve sudden or severe rash/hives, skin peeling, or any reaction on the inside of your mouth or nose? no Did you need to seek medical attention at a hospital or doctor's office? Yes When did it last happen?years  If all above answers are "NO", may proceed with cephalosporin use.    . Lidocaine Other (See Comments)    Unsure  At dentist's office    Antimicrobials this admission: 3/2 azithro/ceftriaxone x 1 3/2 vancomycin>> 3/2 cefepime>>   Microbiology results: 3/2 SARS Coronavirus 2: Negative  3/2 Bcx: pending   Thank you for allowing pharmacy to be a part of this patient's care.  [place] PharmD, BCPS Clinical Pharmacist 04/28/2019 5:17 PM

## 2019-04-28 NOTE — ED Notes (Signed)
Rt at bedside at this time.

## 2019-04-28 NOTE — H&P (Signed)
History and Physical    Joseph Peters UQJ:[id number] DOB: October 08, 1955 DOA: 04/28/2019  Referring MD/NP/PA:   PCP: Barbaraann Boys, MD   Patient coming from:  The patient is coming from home.  At baseline, pt is independent for most of ADL.        Chief Complaint: Shortness of breath,  HPI: Joseph Peters is a 64 y.o. male with medical history significant of hypertension, hyperlipidemia, COPD on 3 L nasal cannula oxygen at home, asthma, stroke, GERD, depression, anxiety, RLS, CAD, CHF with EF 35-40%, IBS, alcohol abuse, tobacco abuse, AAA, BPH, MAC infection, who presents with shortness of breath.  Patient states that he started having shortness of breath since yesterday, which has been progressively worsening.  He has some productive cough, no fever or chills.  He also has intermittent left-sided chest pain, which is sharp, moderate, nonradiating.  He states that he had nausea and vomiting and diarrhea yesterday, which has resolved.  Currently patient does not have nausea, vomiting, abdominal pain or diarrhea.  No symptoms of UTI or unilateral weakness.  ED Course: pt was found to have WBC 12.4, BNP> 4500, troponin 77, 75, lactic acid 2.2, 1.8, negative COVID-19 PCR, electrolytes renal function okay, temperature normal, blood pressure 161/110, tachycardia, tachypnea, oxygen desaturation to 85% on 3 L home level of oxygen, requiring BiPAP.  Patient is admitted to stepdown as an inpatient.  CXR showed: Stable appearing underlying chronic lung disease/emphysema and pulmonary scarring with superimposed infiltrates and small right effusion.  CTA of chest:  1. No evidence acute pulmonary embolism. 2. Worsening airspace disease in the lingula and LEFT lower lobe suggesting pulmonary edema versus pneumonia. 3. Stable dense consolidation in the RIGHT upper lobe and diffuse airspace disease in the RIGHT lower lobe suggesting multifocal pneumonia. 4. Bilateral pleural effusions unchanged. 5.  Ascending thoracic aortic aneurysm.  Review of Systems:   General: no fevers, chills, no body weight gain, has poor appetite, has fatigue HEENT: no blurry vision, hearing changes or sore throat Respiratory: has dyspnea, coughing, wheezing CV: has chest pain, no palpitations GI: had nausea, vomiting, diarrhea, no constipation, abdominal pain GU: no dysuria, burning on urination, increased urinary frequency, hematuria  Ext: no leg edema Neuro: no unilateral weakness, numbness, or tingling, no vision change or hearing loss Skin: no rash, no skin tear. MSK: No muscle spasm, no deformity, no limitation of range of movement in spin Heme: No easy bruising.  Travel history: No recent long distant travel.  Allergy:  Allergies  Allergen Reactions  . Penicillins Swelling, Rash and Other (See Comments)    Swelling in throat  Did it involve swelling of the face/tongue/throat, SOB, or low BP? yes Did it involve sudden or severe rash/hives, skin peeling, or any reaction on the inside of your mouth or nose? no Did you need to seek medical attention at a hospital or doctor's office? Yes When did it last happen?years  If all above answers are "NO", may proceed with cephalosporin use.    . Lidocaine Other (See Comments)    Unsure  At dentist's office    Past Medical History:  Diagnosis Date  . AAA (abdominal aortic aneurysm) without rupture ([place])   . Abdominal aneurysm ([place]) 2015   being followed but not large enough to surgically treat  . Alcoholism ([place])   . Anxiety   . Arthritis   . Asthma   . Benign hypertension 07/16/2014  . Chest pain on exertion 07/16/2014  . Chronic pain syndrome   .  Coccydynia 02/08/2012  . COPD (chronic obstructive pulmonary disease) ([place])    patient smokes  . Coronary artery disease   . 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 Depression   . 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 Dyspnea   . Frequent falls   . GERD  (gastroesophageal reflux disease)   . Hyperlipidemia   . IBS (irritable bowel syndrome)   . Myocardial infarction ([place]) 2010  . Neuropathy   . Pneumonia   . Prostatitis   . Restless leg syndrome   . Sciatica   . Spinal stenosis of lumbar region   . Stroke ([place]) 2015   no residual symptoms  . Tremor, essential     Past Surgical History:  Procedure Laterality Date  . CARDIAC CATHETERIZATION  2012  . COLONOSCOPY WITH PROPOFOL N/A 11/14/2016   Procedure: COLONOSCOPY WITH PROPOFOL;  Surgeon: Manya Silvas, MD;  Location: [facility] ENDOSCOPY;  Service: Endoscopy;  Laterality: N/A;  . ESOPHAGOGASTRODUODENOSCOPY (EGD) WITH PROPOFOL N/A 11/14/2016   Procedure: ESOPHAGOGASTRODUODENOSCOPY (EGD) WITH PROPOFOL;  Surgeon: Manya Silvas, MD;  Location: [facility] ENDOSCOPY;  Service: Endoscopy;  Laterality: N/A;  . JOINT REPLACEMENT Right    hip  . KNEE SURGERY     right  . SHOULDER ARTHROSCOPY WITH OPEN ROTATOR CUFF REPAIR Right 08/20/2017   Procedure: SHOULDER ARTHROSCOPY WITH OPEN ROTATOR CUFF REPAIR;  Surgeon: Corky Mull, MD;  Location: [facility] ORS;  Service: Orthopedics;  Laterality: Right;  . SHOULDER ARTHROSCOPY WITH SUBACROMIAL DECOMPRESSION, ROTATOR CUFF REPAIR AND BICEP TENDON REPAIR Left 05/10/2016   Procedure: SHOULDER ARTHROSCOPY WITH DEBTRIDEMENT, DECOMPRESSION, REPAIR OF MASSIVE ROTATOR CUFF TEAR AND BICEP TENODESIS;  Surgeon: Corky Mull, MD;  Location: [facility] ORS;  Service: Orthopedics;  Laterality: Left;  Massive cuff tear  . TOTAL HIP ARTHROPLASTY Right 07/01/2018   Procedure: TOTAL HIP ARTHROPLASTY - RIGHT;  Surgeon: Corky Mull, MD;  Location: [facility] ORS;  Service: Orthopedics;  Laterality: Right;  Marland Kitchen VIDEO BRONCHOSCOPY WITH ENDOBRONCHIAL NAVIGATION N/A 02/13/2019   Procedure: VIDEO BRONCHOSCOPY WITH ENDOBRONCHIAL NAVIGATION;  Surgeon: Ottie Glazier, MD;  Location: [facility] ORS;  Service: Thoracic;  Laterality: N/A;  . VIDEO BRONCHOSCOPY WITH ENDOBRONCHIAL NAVIGATION N/A 03/13/2019    Procedure: VIDEO BRONCHOSCOPY WITH ENDOBRONCHIAL NAVIGATION;  Surgeon: Ottie Glazier, MD;  Location: [facility] ORS;  Service: Thoracic;  Laterality: N/A;  . VIDEO BRONCHOSCOPY WITH ENDOBRONCHIAL ULTRASOUND N/A 02/13/2019   Procedure: VIDEO BRONCHOSCOPY WITH ENDOBRONCHIAL ULTRASOUND;  Surgeon: Ottie Glazier, MD;  Location: [facility] ORS;  Service: Thoracic;  Laterality: N/A;  . VIDEO BRONCHOSCOPY WITH ENDOBRONCHIAL ULTRASOUND N/A 03/13/2019   Procedure: VIDEO BRONCHOSCOPY WITH ENDOBRONCHIAL ULTRASOUND;  Surgeon: Ottie Glazier, MD;  Location: [facility] ORS;  Service: Thoracic;  Laterality: N/A;    Social History:  reports that he has been smoking cigarettes. He started smoking about 46 years ago. He has a 15.00 pack-year smoking history. He quit smokeless tobacco use about 21 years ago. He reports current alcohol use of about 20.0 standard drinks of alcohol per week. He reports current drug use.  Family History:  Family History  Problem Relation Age of Onset  . Cancer Mother   . COPD Father 80       black lung     Prior to Admission medications   Medication Sig Start Date End Date Taking? Authorizing Provider  albuterol (PROVENTIL HFA;VENTOLIN HFA) 108 (90 Base) MCG/ACT inhaler Inhale 2 puffs into the lungs every 4 (four) hours as needed for wheezing or shortness of breath.  Yes [provider]  DALIRESP 250 MCG TABS Take 250 mcg by mouth at bedtime. 10/17/18  Yes Ojie, Jude, MD  ethambutol (MYAMBUTOL) 400 MG tablet Take 2 tablets (800 mg total) by mouth daily. 04/11/19  Yes Wyvonnia Dusky, MD  finasteride (PROSCAR) 5 MG tablet Take 1 tablet (5 mg total) by mouth daily. 03/02/19  Yes Festus Aloe, MD  FLUoxetine (PROZAC) 20 MG capsule Take 20 mg by mouth daily.    Yes [provider]  Fluticasone-Umeclidin-Vilant (TRELEGY ELLIPTA) 100-62.5-25 MCG/INH AEPB Inhale 2 puffs into the lungs at bedtime.  08/18/18  Yes [provider]  gabapentin (NEURONTIN) 300 MG capsule Take  900 mg by mouth at bedtime.    Yes [provider]  losartan (COZAAR) 25 MG tablet Take 25 mg by mouth daily.    Yes [provider]  metoprolol succinate (TOPROL-XL) 25 MG 24 hr tablet Take 1 tablet (25 mg total) by mouth daily. 04/12/19 05/12/19 Yes Wyvonnia Dusky, MD  mirtazapine (REMERON) 7.5 MG tablet Take 1 tablet (7.5 mg total) by mouth at bedtime. 04/23/19 05/23/19 Yes Wyvonnia Dusky, MD  montelukast (SINGULAIR) 10 MG tablet Take 10 mg by mouth at bedtime.   Yes [provider]  omeprazole (PRILOSEC) 20 MG capsule Take 20 mg by mouth daily.   Yes [provider]  ondansetron (ZOFRAN ODT) 4 MG disintegrating tablet Take 1 tablet (4 mg total) by mouth every 8 (eight) hours as needed for nausea or vomiting. 04/02/19  Yes Tsosie Billing, MD  Oxycodone HCl 10 MG TABS Take 10 mg by mouth 4 (four) times daily as needed for pain. 09/29/18  Yes [provider]  predniSONE (DELTASONE) 20 MG tablet Take 2 tablets (40 mg total) by mouth daily for 5 days. 04/27/19 05/02/19 Yes Merlyn Lot, MD  rifampin (RIFADIN) 300 MG capsule Take 2 capsules (600 mg total) by mouth daily. 04/12/19 05/12/19 Yes Wyvonnia Dusky, MD  rosuvastatin (CRESTOR) 5 MG tablet Take 5 mg by mouth at bedtime.   Yes [provider]  tamsulosin (FLOMAX) 0.4 MG CAPS capsule Take 1 capsule (0.4 mg total) by mouth daily after supper. 12/22/18  Yes Festus Aloe, MD  Morphine Sulfate (MORPHINE CONCENTRATE) 10 MG/0.5ML SOLN concentrated solution Take 0.5 mLs (10 mg total) by mouth every 8 (eight) hours as needed for up to 5 days for severe pain or shortness of breath. 04/23/19 04/28/19  Wyvonnia Dusky, MD    Physical Exam: Vitals:   04/28/19 1630 04/28/19 1755 04/28/19 1758 04/28/19 1855  BP: (!) 135/98 136/87 136/87 (!) 129/99  Pulse: 95 87 86 100  Resp: (!) 21   (!) 30  Temp:    97.9 F (36.6 C)  TempSrc:    Axillary  SpO2: 97%   99%  Weight:    50.4 kg    Height:    _0  (1.651 m)   General: Not in acute distress.  Chronically ill looking. HEENT:       Eyes: PERRL, EOMI, no scleral icterus.       ENT: No discharge from the ears and nose, no pharynx injection, no tonsillar enlargement.        Neck: positive JVD, no bruit, no mass felt. Heme: No neck lymph node enlargement. Cardiac: S1/S2, RRR, No murmurs, No gallops or rubs. Respiratory: Coarse breathing sound and mild wheezing bilaterally GI: Soft, nondistended, nontender, no rebound pain, no organomegaly, BS present. GU: No hematuria Ext: No pitting leg edema bilaterally. 2+DP/PT  pulse bilaterally. Musculoskeletal: No joint deformities, No joint redness or warmth, no limitation of ROM in spin. Skin: No rashes.  Neuro: Alert, oriented X3, cranial nerves II-XII grossly intact, moves all extremities normally. Psych: Patient is not psychotic, no suicidal or hemocidal ideation.  Labs on Admission: I have personally reviewed following labs and imaging studies  CBC: Recent Labs  Lab 04/22/19 0403 04/23/19 0504 04/27/19 1323 04/28/19 1037  WBC 9.2 8.9 8.0 12.4*  NEUTROABS  --   --  5.5  --   HGB 12.5* 12.5* 13.0 13.2  HCT 38.5* 37.7* 40.2 40.1  MCV 88.9 89.3 89.1 88.5  PLT 251 249 234 161   Basic Metabolic Panel: Recent Labs  Lab 04/23/19 0504 04/27/19 1323 04/28/19 1037  NA 136 136 135  K 4.1 4.0 3.7  CL 102 104 103  CO2 25 25 21*  GLUCOSE 78 88 120*  BUN _0 CREATININE 0.92 0.90 1.02  CALCIUM 8.2* 8.1* 8.4*   GFR: Estimated Creatinine Clearance: 53.5 mL/min (by C-G formula based on SCr of 1.02 mg/dL). Liver Function Tests: Recent Labs  Lab 04/27/19 1323  AST 16  ALT 12  ALKPHOS 72  BILITOT 0.8  PROT 7.0  ALBUMIN 3.0*   No results for input(s): LIPASE, AMYLASE in the last 168 hours. No results for input(s): AMMONIA in the last 168 hours. Coagulation Profile: No results for input(s): INR, PROTIME in the last 168 hours. Cardiac Enzymes: No results  for input(s): CKTOTAL, CKMB, CKMBINDEX, TROPONINI in the last 168 hours. BNP (last 3 results) No results for input(s): PROBNP in the last 8760 hours. HbA1C: No results for input(s): HGBA1C in the last 72 hours. CBG: Recent Labs  Lab 04/28/19 1855  GLUCAP 116*   Lipid Profile: No results for input(s): CHOL, HDL, LDLCALC, TRIG, CHOLHDL, LDLDIRECT in the last 72 hours. Thyroid Function Tests: No results for input(s): TSH, T4TOTAL, FREET4, T3FREE, THYROIDAB in the last 72 hours. Anemia Panel: No results for input(s): VITAMINB12, FOLATE, FERRITIN, TIBC, IRON, RETICCTPCT in the last 72 hours. Urine analysis:    Component Value Date/Time   COLORURINE STRAW (A) 10/14/2018 1112   APPEARANCEUR CLEAR (A) 10/14/2018 1112   APPEARANCEUR Clear 11/02/2014 1602   LABSPEC 1.036 (H) 10/14/2018 1112   PHURINE 6.0 10/14/2018 1112   GLUCOSEU NEGATIVE 10/14/2018 1112   HGBUR NEGATIVE 10/14/2018 1112   BILIRUBINUR NEGATIVE 10/14/2018 1112   BILIRUBINUR Negative 11/02/2014 1602   KETONESUR 5 (A) 10/14/2018 1112   PROTEINUR NEGATIVE 10/14/2018 1112   NITRITE NEGATIVE 10/14/2018 1112   LEUKOCYTESUR NEGATIVE 10/14/2018 1112   Sepsis Labs: _1 (procalcitonin:4,lacticidven:4) ) Recent Results (from the past 240 hour(s))  Body fluid culture     Status: None   Collection Time: 04/22/19 12:13 PM   Specimen: PATH Cytology Pleural fluid  Result Value Ref Range Status   Specimen Description   Final    PLEURAL Performed at [facility], [address] [place], [place] [address]    Special Requests NONE  Final   Gram Stain   Final    FEW WBC PRESENT, PREDOMINANTLY MONONUCLEAR NO ORGANISMS SEEN    Culture   Final    NO GROWTH 3 DAYS Performed at [facility], [place] [address], [place], [place] [address]    Report Status 04/26/2019 FINAL  Final  Fungus Culture With Stain     Status: None (Preliminary result)   Collection Time: 04/22/19 12:13 PM   Specimen: PATH Cytology  Pleural fluid  Result  Value Ref Range Status   Fungus Stain Final report  Final    Comment: (NOTE) Performed At: [facility] [place], [place] [id number] Rush Farmer MD YK:[id number]    Fungus (Mycology) Culture PENDING  Incomplete   Fungal Source PLEURAL  Final    Comment: Performed at [facility] [address] [place], [place] [address]  Acid Fast Smear (AFB)     Status: None   Collection Time: 04/22/19 12:13 PM   Specimen: PATH Cytology Pleural fluid  Result Value Ref Range Status   AFB Specimen Processing Concentration  Final   Acid Fast Smear Negative  Final    Comment: (NOTE) Performed At: [facility] [address] [place], [place] [id number] Rush Farmer MD IO:[id number]    Source (AFB) PLEURAL  Final    Comment: Performed at [facility], [address] [place], [place] [address]  Fungus Culture Result     Status: None   Collection Time: 04/22/19 12:13 PM  Result Value Ref Range Status   Result 1 Comment  Final    Comment: (NOTE) KOH/Calcofluor preparation:  no fungus observed. Performed At: [facility] [place], [place] [id number] Rush Farmer MD [id number]   Respiratory Panel by RT PCR (Flu A&B, Covid) - Nasopharyngeal Swab     Status: None   Collection Time: 04/28/19 11:37 AM   Specimen: Nasopharyngeal Swab  Result Value Ref Range Status   SARS Coronavirus 2 by RT PCR NEGATIVE NEGATIVE Final    Comment: (NOTE) SARS-CoV-2 target nucleic acids are NOT DETECTED. The SARS-CoV-2 RNA is generally detectable in upper respiratoy specimens during the acute phase of infection. The lowest concentration of SARS-CoV-2 viral copies this assay can detect is 131 copies/mL. A negative result does not preclude SARS-Cov-2 infection and should not be used as the sole basis for treatment or other patient management decisions. A negative result may occur with  improper specimen  collection/handling, submission of specimen other than nasopharyngeal swab, presence of viral mutation(s) within the areas targeted by this assay, and inadequate number of viral copies (<131 copies/mL). A negative result must be combined with clinical observations, patient history, and epidemiological information. The expected result is Negative. Fact Sheet for Patients:  PinkCheek.be Fact Sheet for Healthcare Providers:  GravelBags.it This test is not yet ap proved or cleared by the [place] FDA and  has been authorized for detection and/or diagnosis of SARS-CoV-2 by FDA under an Emergency Use Authorization (EUA). This EUA will remain  in effect (meaning this test can be used) for the duration of the COVID-19 declaration under Section 564(b)(1) of the Act, 21 U.S.C. section 360bbb-3(b)(1), unless the authorization is terminated or revoked sooner.    Influenza A by PCR NEGATIVE NEGATIVE Final   Influenza B by PCR NEGATIVE NEGATIVE Final    Comment: (NOTE) The Xpert Xpress SARS-CoV-2/FLU/RSV assay is intended as an aid in  the diagnosis of influenza from Nasopharyngeal swab specimens and  should not be used as a sole basis for treatment. Nasal washings and  aspirates are unacceptable for Xpert Xpress SARS-CoV-2/FLU/RSV  testing. Fact Sheet for Patients: PinkCheek.be Fact Sheet for Healthcare Providers: GravelBags.it This test is not yet approved or cleared by the [place] FDA and  has been authorized for detection and/or diagnosis of SARS-CoV-2 by  FDA under an Emergency Use Authorization (EUA). This EUA will remain  in effect (meaning this test can be used) for the duration of the  Covid-19 declaration under  Section 564(b)(1) of the Act, 21  U.S.C. section 360bbb-3(b)(1), unless the authorization is  terminated or revoked. Performed at [facility],  [address] [place], [place] [address]      Radiological Exams on Admission: CT Angio Chest PE W/Cm &/Or Wo Cm  Result Date: 04/28/2019 CLINICAL DATA:  Short of breath.  Tachycardia. EXAM: CT ANGIOGRAPHY CHEST WITH CONTRAST TECHNIQUE: Multidetector CT imaging of the chest was performed using the standard protocol during bolus administration of intravenous contrast. Multiplanar CT image reconstructions and MIPs were obtained to evaluate the vascular anatomy. CONTRAST:  75 mL Omnipaque COMPARISON:  Chest CT 04/16/2019 FINDINGS: Cardiovascular: No filling defects within the pulmonary arteries to suggest acute pulmonary embolism. Ascending aorta is aneurysmal at 42 mm (image 30/7). Aneurysm is fusiform. No axillary supraclavicular adenopathy. No mediastinal hilar adenopathy. No pericardial effusion. Mediastinum/Nodes: No axillary supraclavicular adenopathy. No mediastinal adenopathy. Lungs/Pleura: Severe centrilobular emphysema. Superimposed consolidation in the RIGHT upper lobe not changed from prior. Small central cavitation is also similar (image 26/6). Pleural fluid at the RIGHT lung base and airspace disease in the RIGHT lower lobe is also similar. Within the LEFT lung, moderate pleural effusion. New airspace disease in the lower lobe and lingula. These findings in LEFT lung are worsened from comparison exam. Upper Abdomen: Limited view of the liver, kidneys, pancreas are unremarkable. Normal adrenal glands. Musculoskeletal: No aggressive osseous lesion. Review of the MIP images confirms the above findings. IMPRESSION: 1. No evidence acute pulmonary embolism. 2. Worsening airspace disease in the lingula and LEFT lower lobe suggesting pulmonary edema versus pneumonia. 3. Stable dense consolidation in the RIGHT upper lobe and diffuse airspace disease in the RIGHT lower lobe suggesting multifocal pneumonia. 4. Bilateral pleural effusions unchanged. 5. Ascending thoracic aortic aneurysm. Recommend annual  imaging followup by CTA or MRA. This recommendation follows 2010 ACCF/AHA/AATS/ACR/ASA/SCA/SCAI/SIR/STS/SVM Guidelines for the Diagnosis and Management of Patients with Thoracic Aortic Disease. Circulation. 2010; 121: J883-G549. Aortic aneurysm NOS (ICD10-I71.9) Electronically Signed   By: Suzy Bouchard M.D.   On: 04/28/2019 12:47   DG Chest Portable 1 View  Result Date: 04/28/2019 CLINICAL DATA:  Shortness of breath. EXAM: PORTABLE CHEST 1 VIEW COMPARISON:  04/27/2019 FINDINGS: The cardiac silhouette, mediastinal and hilar contours are within normal limits and stable. Stable tortuosity of the thoracic aorta. Stable underlying chronic lung disease with emphysema and pulmonary scarring. Persistent superimposed bilateral infiltrates with persistent right upper lobe airspace consolidation and air bronchograms. Small right pleural effusion. No pneumothorax. IMPRESSION: Stable appearing underlying chronic lung disease/emphysema and pulmonary scarring with superimposed infiltrates and small right effusion. Electronically Signed   By: Marijo Sanes M.D.   On: 04/28/2019 10:56   DG Chest Portable 1 View  Result Date: 04/27/2019 CLINICAL DATA:  Shortness of breath EXAM: PORTABLE CHEST 1 VIEW COMPARISON:  04/22/2019 FINDINGS: Increased density at the right lung base. Stable right pleural effusion. Stable fibrotic changes. Cardiomediastinal contours and volume loss in the right chest are unchanged. IMPRESSION: Increased patchy atelectasis/consolidation at the right lung base. Otherwise stable appearance. Electronically Signed   By: Macy Mis M.D.   On: 04/27/2019 14:40     EKG: Independently reviewed.  Sinus rhythm, QTC 434, LAD, anteroseptal infarction pattern, Q waves were seen in V4-V6.   Assessment/Plan Principal Problem:   Acute on chronic respiratory failure with hypoxia (HCC) Active Problems:   COPD exacerbation (HCC)   Essential hypertension   Hyperlipidemia   Gastroesophageal reflux disease  without esophagitis   History of MAC infection  Acute on chronic systolic CHF (congestive heart failure) (HCC)   Elevated troponin   HCAP (healthcare-associated pneumonia)   Sepsis ([place])   CAD (coronary artery disease)   Acute on chronic respiratory failure with hypoxia: Likely due to multifactorial etiology, including COPD exacerbation, possible HCAP, CHF exacerbation and history of MAC infection.  Patient has leukocytosis, bilateral infiltration on CT angiogram, indicating possible HCAP.  Patient has no leg edema, but has positive JVD, elevated BNP>4500, indicating CHF exacerbation.  Patient has coarse breathing sound and wheezing on auscultation, indicating COPD exacerbation.  -Admit to stepdown as inpatient -Continue BiPAP -Bronchodilators -IV diuretics -Nasal cannula oxygen to maintain oxygen saturation above 93% when patient is off BiPAP  Possible HCAP: - IV Vancomycin and cefepime (patient received 1 dose of Rocephin and azithromycin) - Mucinex for cough  - Bronchodilators - Urine legionella and S. pneumococcal antigen - Follow up blood culture x2, sputum culture  COPD exacerbation: -solumedrol 40 mg bid -Bronchodilators as above -Singulair, Daliresp  History of MAC infection: -continue ethambutol and rifampin  Sepsis: Patient meets critical for sepsis with leukocytosis, tachycardia and tachypnea.  Lactic acid elevated 2.2, which is normalized now. -Will not give IV fluid since patient has a CHF exacerbation -Follow-up of blood culture  Acute on chronic systolic CHF: 2D echo on 04/29/9177 showed EF 35-40% -IV Lasix 20 mg twice daily  HTN:  -Continue home medications: -hydralazine prn  Hyperlipidemia -Crestor  Gastroesophageal reflux disease without esophagitis -Protonix  Elevated troponin and hx of CAD: Patient has had some left-sided chest pain.  CT angiogram is negative for PE.  Troponin 77-75.  Likely due to demand ischemia -Crestor,  metoprolol -Aspirin -Trend troponin -will check A1c, FLP -Repeat EKG in morning  BPH: stable - Continue Flomax and Proscar  BPH: stable - Continue Flomax and Proscar    Depression: Stable, no suicidal or homicidal ideations. -Continue home medications      Inpatient status:  # Patient requires inpatient status due to high intensity of service, high risk for further deterioration and high frequency of surveillance required.  I certify that at the point of admission it is my clinical judgment that the patient will require inpatient hospital care spanning beyond 2 midnights from the point of admission.  . This patient has multiple chronic comorbidities including hypertension, hyperlipidemia, COPD on 3 L nasal cannula oxygen at home, asthma, stroke, GERD, depression, anxiety, RLS, CAD, CHF with EF 35-40%, IBS, alcohol abuse, tobacco abuse, AAA, BPH, MAC infection . Now patient has presenting with acute on chronic respiratory failure with hypoxia, COPD exacerbation, HCAP, CHF exacerbation . The worrisome physical exam findings include coarse breathing sound no wheezing on auscultation . The initial radiographic and laboratory data are worrisome because of leukocytosis, elevated lactic acid, elevated BNP, elevated troponin, bilateral infiltration on CT angiogram of the chest . Current medical needs: please see my assessment and plan . Predictability of an adverse outcome (risk): Patient has multiple comorbidities as listed above. Now presents with acute on chronic respiratory failure with hypoxia, COPD exacerbation, HCAP, CHF exacerbation. Patient's presentation is highly complicated.  Patient is at high risk of deteriorating.  Will need to be treated in hospital for at least 2 days.           DVT ppx: SQ Lovenox Code Status: Full code Family Communication: not done, no family member is at bed side.      Disposition Plan:  Anticipate discharge back to previous home  environment Consults called:  none Admission status:  SDU/inpation       Date of Service 04/28/2019    Ivor Costa [facility]   If 7PM-7AM, please contact night-coverage [email or web address] 04/28/2019, 7:12 PM

## 2019-04-28 NOTE — Progress Notes (Signed)
PHARMACY -  BRIEF ANTIBIOTIC NOTE   Pharmacy has received consult(s) for CAP coverage in patient with PCN allergry from an ED provider.  The patient's profile has been reviewed for ht/wt/allergies/indication/available labs. Patient has received cephalosporins in the past without incidence.   One time order(s) placed for Ceftriaxone 2g IV.  Further antibiotics/pharmacy consults should be ordered by admitting physician if indicated.                       Thank you, Vira Blanco 04/28/2019  11:37 AM

## 2019-04-28 NOTE — ED Notes (Addendum)
Pt ambulatory to barthroom with a  Steady gait.

## 2019-04-28 NOTE — ED Provider Notes (Signed)
[facility] Emergency Department Provider Note  ____________________________________________   First MD Initiated Contact with Patient 04/28/19 1032     (approximate)  I have reviewed the triage vital signs and the nursing notes.  History  Chief Complaint Shortness of Breath    HPI Joseph Peters is a 64 y.o. 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  Patient was seen here yesterday for cough and shortness of breath, felt to be due to a COPD exacerbation.  Responded well to nebulizers and had no increased oxygen requirement and was discharged from the ED.  He reports feeling well upon discharge, but later in the evening upon returning home his shortness of breath worsened as well as increased work of breathing.  He reports associated central chest discomfort.  Describes it as a tightness sensation.  No radiation.  No alleviating or aggravating components.  Used a nebulizer treatment at home prior to arrival with minimal improvement.  He reports compliance with all of his medications.  On EMS arrival at his home he was 85% on 3 L nasal cannula, placed on nonrebreather with improvement to 100%.   Past Medical Hx Past Medical History:  Diagnosis Date  . AAA (abdominal aortic aneurysm) without rupture ([place])   . Abdominal aneurysm ([place]) 2015   being followed but not large enough to surgically treat  . Alcoholism ([place])   . Anxiety   . Arthritis   . Asthma   . Benign hypertension 07/16/2014  . Chest pain on exertion 07/16/2014  . Chronic pain syndrome   . Coccydynia 02/08/2012  . COPD (chronic obstructive pulmonary disease) ([place])    patient smokes  . Coronary artery disease   . 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 Depression   . 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 Dyspnea   . Frequent falls   . GERD (gastroesophageal reflux disease)   . Hyperlipidemia   . IBS (irritable bowel syndrome)   . Myocardial infarction ([place]) 2010  . Neuropathy   . Pneumonia   . Prostatitis   . Restless leg syndrome   . Sciatica   . Spinal stenosis of lumbar region   . Stroke ([place]) 2015   no residual symptoms  . Tremor, essential     Problem List Patient Active Problem List   Diagnosis Date Noted  . History of MAC infection   . Pleural effusion on right   . SOB (shortness of breath)   . Anxiety about health   . Dyspnea   . Acute on chronic respiratory failure with hypoxia ([place])   . Goals of care, counseling/discussion   . Advanced care planning/counseling discussion   . Palliative care by specialist   . Restless legs syndrome (RLS) 04/01/2019  . Protein-calorie malnutrition, severe 10/15/2018  . Nodule of upper lobe of right lung 10/15/2018  . RLQ abdominal pain 10/14/2018  . Status post total hip replacement, right 07/01/2018  . Strain of right knee 06/26/2018  . Lumbar strain 06/16/2018  . Strain of right hip 06/16/2018  . Hyperlipidemia 01/30/2018  . Rotator cuff tendinitis, right 08/19/2017  . Traumatic complete tear of right rotator cuff 08/19/2017  . Ascending aortic aneurysm ([place]) 08/01/2017  . Tremor, essential 05/28/2017  . Gastroesophageal reflux disease without esophagitis 12/25/2016  .  Renal artery stenosis ([place]) 12/05/2015  . 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 Syncope and collapse 12/05/2015  . Essential hypertension 10/15/2015  . COPD exacerbation ([place]) 01/31/2015  . Acute respiratory failure ([place]) 01/31/2015  . Drug allergy 01/30/2015  . AAA (abdominal aortic aneurysm) ([employer] [place]) 11/23/2014  . Prostate cancer screening 11/02/2014  . Gross hematuria 11/02/2014  . Benign hypertension 07/16/2014  . 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 Occlusion and  stenosis of unspecified carotid artery 05/14/2012  . Coccydynia 02/08/2012  . 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 Gait disorder 02/08/2012  . Spinal stenosis of lumbar region 01/13/2009  . Atherosclerotic heart disease of native coronary artery without angina pectoris 06/28/2008  . Sciatica 06/28/2008  . Recurrent major depressive disorder in partial remission ([place]) 06/28/2008    Past Surgical Hx Past Surgical History:  Procedure Laterality Date  . CARDIAC CATHETERIZATION  2012  . COLONOSCOPY WITH PROPOFOL N/A 11/14/2016   Procedure: COLONOSCOPY WITH PROPOFOL;  Surgeon: Manya Silvas, MD;  Location: [facility] ENDOSCOPY;  Service: Endoscopy;  Laterality: N/A;  . ESOPHAGOGASTRODUODENOSCOPY (EGD) WITH PROPOFOL N/A 11/14/2016   Procedure: ESOPHAGOGASTRODUODENOSCOPY (EGD) WITH PROPOFOL;  Surgeon: Manya Silvas, MD;  Location: [facility] ENDOSCOPY;  Service: Endoscopy;  Laterality: N/A;  . JOINT REPLACEMENT Right    hip  . KNEE SURGERY     right  . SHOULDER ARTHROSCOPY WITH OPEN ROTATOR CUFF REPAIR Right 08/20/2017   Procedure: SHOULDER ARTHROSCOPY WITH OPEN ROTATOR CUFF REPAIR;  Surgeon: Corky Mull, MD;  Location: [facility] ORS;  Service: Orthopedics;  Laterality: Right;  . SHOULDER ARTHROSCOPY WITH SUBACROMIAL DECOMPRESSION, ROTATOR CUFF REPAIR AND BICEP TENDON REPAIR Left 05/10/2016   Procedure: SHOULDER ARTHROSCOPY WITH DEBTRIDEMENT, DECOMPRESSION, REPAIR OF MASSIVE ROTATOR CUFF TEAR AND BICEP TENODESIS;  Surgeon: Corky Mull, MD;  Location: [facility] ORS;  Service: Orthopedics;  Laterality: Left;  Massive cuff tear  . TOTAL HIP ARTHROPLASTY Right 07/01/2018   Procedure: TOTAL HIP ARTHROPLASTY - RIGHT;  Surgeon: Corky Mull, MD;  Location: [facility] ORS;  Service: Orthopedics;  Laterality: Right;  Marland Kitchen VIDEO BRONCHOSCOPY WITH ENDOBRONCHIAL NAVIGATION N/A 02/13/2019   Procedure: VIDEO BRONCHOSCOPY WITH ENDOBRONCHIAL NAVIGATION;  Surgeon: Ottie Glazier, MD;  Location: [facility] ORS;  Service: Thoracic;   Laterality: N/A;  . VIDEO BRONCHOSCOPY WITH ENDOBRONCHIAL NAVIGATION N/A 03/13/2019   Procedure: VIDEO BRONCHOSCOPY WITH ENDOBRONCHIAL NAVIGATION;  Surgeon: Ottie Glazier, MD;  Location: [facility] ORS;  Service: Thoracic;  Laterality: N/A;  . VIDEO BRONCHOSCOPY WITH ENDOBRONCHIAL ULTRASOUND N/A 02/13/2019   Procedure: VIDEO BRONCHOSCOPY WITH ENDOBRONCHIAL ULTRASOUND;  Surgeon: Ottie Glazier, MD;  Location: [facility] ORS;  Service: Thoracic;  Laterality: N/A;  . VIDEO BRONCHOSCOPY WITH ENDOBRONCHIAL ULTRASOUND N/A 03/13/2019   Procedure: VIDEO BRONCHOSCOPY WITH ENDOBRONCHIAL ULTRASOUND;  Surgeon: Ottie Glazier, MD;  Location: [facility] ORS;  Service: Thoracic;  Laterality: N/A;    Medications Prior to Admission medications   Medication Sig Start Date End Date Taking? Authorizing Provider  albuterol (PROVENTIL HFA;VENTOLIN HFA) 108 (90 Base) MCG/ACT inhaler Inhale 2 puffs into the lungs every 4 (four) hours as needed for wheezing or shortness of breath.     [provider]  azithromycin (ZITHROMAX) 500 MG tablet Take 1 tablet (500 mg total) by mouth daily. 04/11/19   Wyvonnia Dusky, MD  DALIRESP 250 MCG TABS Take 250 mcg by mouth at bedtime. 10/17/18   Stark Jock Jude, MD  ethambutol (MYAMBUTOL) 400 MG tablet Take 2 tablets (800 mg total) by mouth daily. 04/11/19  Wyvonnia Dusky, MD  finasteride (PROSCAR) 5 MG tablet Take 1 tablet (5 mg total) by mouth daily. 03/02/19   Festus Aloe, MD  FLUoxetine (PROZAC) 20 MG capsule Take 20 mg by mouth daily.     [provider]  Fluticasone-Umeclidin-Vilant (TRELEGY ELLIPTA) 100-62.5-25 MCG/INH AEPB Inhale 2 puffs into the lungs at bedtime.  08/18/18   [provider]  gabapentin (NEURONTIN) 300 MG capsule Take 900 mg by mouth at bedtime.     [provider]  losartan (COZAAR) 25 MG tablet Take 25 mg by mouth daily.     [provider]  metoprolol succinate (TOPROL-XL) 25 MG 24 hr tablet Take 1 tablet (25 mg total) by  mouth daily. 04/12/19 05/12/19  Wyvonnia Dusky, MD  mirtazapine (REMERON) 7.5 MG tablet Take 1 tablet (7.5 mg total) by mouth at bedtime. 04/23/19 05/23/19  Wyvonnia Dusky, MD  montelukast (SINGULAIR) 10 MG tablet Take 10 mg by mouth at bedtime.    [provider]  Morphine Sulfate (MORPHINE CONCENTRATE) 10 MG/0.5ML SOLN concentrated solution Take 0.5 mLs (10 mg total) by mouth every 8 (eight) hours as needed for up to 5 days for severe pain or shortness of breath. 04/23/19 04/28/19  Wyvonnia Dusky, MD  omeprazole (PRILOSEC) 20 MG capsule Take 20 mg by mouth daily.    [provider]  ondansetron (ZOFRAN ODT) 4 MG disintegrating tablet Take 1 tablet (4 mg total) by mouth every 8 (eight) hours as needed for nausea or vomiting. 04/02/19   Tsosie Billing, MD  Oxycodone HCl 10 MG TABS Take 10 mg by mouth 4 (four) times daily as needed for pain. 09/29/18   [provider]  predniSONE (DELTASONE) 20 MG tablet Take 2 tablets (40 mg total) by mouth daily for 5 days. 04/27/19 05/02/19  Merlyn Lot, MD  rifampin (RIFADIN) 300 MG capsule Take 2 capsules (600 mg total) by mouth daily. 04/12/19 05/12/19  Wyvonnia Dusky, MD  rosuvastatin (CRESTOR) 5 MG tablet Take 5 mg by mouth at bedtime.    [provider]  tamsulosin (FLOMAX) 0.4 MG CAPS capsule Take 1 capsule (0.4 mg total) by mouth daily after supper. 12/22/18   Festus Aloe, MD    Allergies Penicillins and Lidocaine  Family Hx Family History  Problem Relation Age of Onset  . Cancer Mother   . COPD Father 44       black lung    Social Hx Social History   Tobacco Use  . Smoking status: Light Tobacco Smoker    Packs/day: 0.50    Years: 30.00    Pack years: 15.00    Types: Cigarettes    Start date: 02/26/1973    Last attempt to quit: 09/21/2018    Years since quitting: 0.6  . Smokeless tobacco: Former Systems developer    Quit date: 02/26/1998  . Tobacco comment: smokes 2-3 cig a week 02/12/19    Substance Use Topics  . Alcohol use: Yes    Alcohol/week: 20.0 standard drinks    Types: 20 Cans of beer per week    Comment: week   . Drug use: Yes    Comment: prescribed hydrocodone     Review of Systems  Constitutional: Negative for fever, chills. Eyes: Negative for visual changes. ENT: Negative for sore throat. Cardiovascular: Positive for chest pain. Respiratory: Positive for shortness of breath. Gastrointestinal: Negative for nausea, vomiting.  Genitourinary: Negative for dysuria. Musculoskeletal: Negative for leg swelling. Skin: Negative for rash. Neurological: Negative for headaches.  Physical Exam  Vital Signs: ED Triage Vitals  Enc Vitals Group     BP 04/28/19 1018 (!) 157/108     Pulse Rate 04/28/19 1018 (!) 121     Resp 04/28/19 1018 (!) 29     Temp 04/28/19 1018 97.8 F (36.6 C)     Temp Source 04/28/19 1018 Oral     SpO2 04/28/19 1018 98 %     Weight 04/28/19 1024 110 lb (49.9 kg)     Height 04/28/19 1024 _0  (1.651 m)     Head Circumference --      Peak Flow --      Pain Score 04/28/19 1024 10     Pain Loc --      Pain Edu? --      Excl. in [place]? --     Constitutional: Alert and oriented.  Thin, frail appearing.  Head: Normocephalic. Atraumatic. Eyes: Conjunctivae clear. Sclera anicteric. Nose: No congestion. No rhinorrhea. Mouth/Throat: Wearing mask.  Neck: No stridor.   Cardiovascular: Tachycardic, regular rhythm. Extremities well perfused. Respiratory: Increased respiratory effort, coarse lung sounds bilaterally, expiratory wheezes more prominent on right. Gastrointestinal: Soft. Non-tender. Non-distended.  Musculoskeletal: No lower extremity edema. No deformities. Neurologic:  Normal speech and language. No gross focal neurologic deficits are appreciated.  Skin: Skin is warm, dry and intact. No rash noted. Psychiatric: Mood and affect are appropriate for situation.  EKG  Personally reviewed.   Rate: 117 Rhythm: sinus Axis:  LAD Intervals: WNL Non-specific ST/T wave changes No STEMI    Radiology  CXR: IMPRESSION:  Stable appearing underlying chronic lung disease/emphysema and pulmonary scarring with superimposed infiltrates and small right effusion.   CT PE: IMPRESSION:  1. No evidence acute pulmonary embolism.  2. Worsening airspace disease in the lingula and LEFT lower lobe  suggesting pulmonary edema versus pneumonia.  3. Stable dense consolidation in the RIGHT upper lobe and diffuse  airspace disease in the RIGHT lower lobe suggesting multifocal  pneumonia.  4. Bilateral pleural effusions unchanged.  5. Ascending thoracic aortic aneurysm. Recommend annual imaging  followup by CTA or MRA. This recommendation follows 2010  ACCF/AHA/AATS/ACR/ASA/SCA/SCAI/SIR/STS/SVM Guidelines for the  Diagnosis and Management of Patients with Thoracic Aortic Disease.  Circulation. 2010; 121: K481-E563. Aortic aneurysm NOS (ICD10-I71.9)    Procedures  Procedure(s) performed (including critical care):  .Critical Care Performed by: Lilia Pro., MD Authorized by: Lilia Pro., MD   Critical care provider statement:    Critical care time (minutes):  45   Critical care was necessary to treat or prevent imminent or life-threatening deterioration of the following conditions:  Respiratory failure   Critical care was time spent personally by me on the following activities:  Discussions with consultants, evaluation of patient's response to treatment, examination of patient, ordering and performing treatments and interventions, ordering and review of laboratory studies, ordering and review of radiographic studies, pulse oximetry, re-evaluation of patient's condition, obtaining history from patient or surrogate and review of old charts     Initial Impression / Assessment and Plan / ED Course  64 y.o. male who presents to the ED for shortness of breath, increased work of breathing, hypoxia  Ddx: PNA  exacerbation, COPD exacerbation, ACS, fluid overload/HF, PE  Will evaluate with labs, imaging. Will trial nebulizer and reassess. Will see if able to down titrate from NRB based on work of breathing.   Work-up reveals mixed picture, likely concurrent exacerbation of his known PNA as well as fluid overload.  CT negative  for PE - notable for airspace disease on left - edema vs PNA. BNP greater than 4500, suggestive of fluid component. High-sensitivity troponin 77 likely in the setting of demand.    Stable RIGHT upper lobe disease, diffuse lower lobe disease suggesting multifocal PNA. WBC 12.4.  Initial lactic acid 2.2, but down trended on repeat to normal (was not provided fluids due to concern for contributing HF exacerbation, suspect improvement due to improved oxygenation and perfusion).  Procalcitonin negative. No hypercarbia on VBG.     Will cover his atypical pneumonia with ceftriaxone and azithromycin, and give a small dose of Lasix.  Work of breathing did not significantly improve on the on nonrebreather, transition to BiPAP with good improvement in WOB, tachycardia and tachypnea. Patient reports objective improvement with BiPAP as well. Will admit.   Final Clinical Impression(s) / ED Diagnosis  Final diagnoses:  Shortness of breath  Hypoxia       Note:  This document was prepared using Dragon voice recognition software and may include unintentional dictation errors.   Lilia Pro., MD 04/28/19 [phone]

## 2019-04-29 DIAGNOSIS — J9621 Acute and chronic respiratory failure with hypoxia: Secondary | ICD-10-CM

## 2019-04-29 LAB — LIPID PANEL
Cholesterol: 218 mg/dL — ABNORMAL HIGH (ref 0–200)
HDL: 37 mg/dL — ABNORMAL LOW (ref >40)
LDL Cholesterol: 161 mg/dL — ABNORMAL HIGH (ref 0–99)
Total CHOL/HDL Ratio: 5.9 RATIO
Triglycerides: 99 mg/dL (ref <150)
VLDL: 20 mg/dL (ref 0–40)

## 2019-04-29 LAB — BASIC METABOLIC PANEL
Anion gap: 15 (ref 5–15)
BUN: 16 mg/dL (ref 8–23)
CO2: 24 mmol/L (ref 22–32)
Calcium: 8.8 mg/dL — ABNORMAL LOW (ref 8.9–10.3)
Chloride: 100 mmol/L (ref 98–111)
Creatinine, Ser: 1.08 mg/dL (ref 0.61–1.24)
GFR calc Af Amer: >60 mL/min (ref >60)
GFR calc non Af Amer: >60 mL/min (ref >60)
Glucose, Bld: 92 mg/dL (ref 70–99)
Potassium: 3.8 mmol/L (ref 3.5–5.1)
Sodium: 139 mmol/L (ref 135–145)

## 2019-04-29 LAB — HEMOGLOBIN A1C
Hgb A1c MFr Bld: 5.6 % (ref 4.8–5.6)
Mean Plasma Glucose: 114.02 mg/dL

## 2019-04-29 LAB — HIV ANTIBODY (ROUTINE TESTING W REFLEX): HIV Screen 4th Generation wRfx: NONREACTIVE

## 2019-04-29 LAB — MAGNESIUM
Magnesium: 1.9 mg/dL (ref 1.7–2.4)
Magnesium: 2.2 mg/dL (ref 1.7–2.4)

## 2019-04-29 LAB — MRSA PCR SCREENING: MRSA by PCR: NEGATIVE

## 2019-04-29 LAB — STREP PNEUMONIAE URINARY ANTIGEN: Strep Pneumo Urinary Antigen: NEGATIVE

## 2019-04-29 MED ORDER — IPRATROPIUM-ALBUTEROL 0.5-2.5 (3) MG/3ML IN SOLN
3.0000 mL | Freq: Four times a day (QID) | RESPIRATORY_TRACT | Status: DC
  Administered 2019-04-29 – 2019-05-01 (×10): 3 mL via RESPIRATORY_TRACT
  Filled 2019-04-29 (×10): qty 3

## 2019-04-29 MED ORDER — CHLORHEXIDINE GLUCONATE CLOTH 2 % EX PADS
6.0000 | MEDICATED_PAD | Freq: Every day | CUTANEOUS | Status: DC
  Administered 2019-04-29: 6 via TOPICAL

## 2019-04-29 NOTE — Progress Notes (Signed)
PROGRESS NOTE    Joseph Peters  VQQ:[id number] DOB: 1955-08-29 DOA: 04/28/2019 PCP: Barbaraann Boys, MD    Brief Narrative:  Joseph Peters is a 64 y.o. male with medical history significant of hypertension, hyperlipidemia, COPD on 3 L nasal cannula oxygen at home, asthma, stroke, GERD, depression, anxiety, RLS, CAD, CHF with EF 35-40%, IBS, alcohol abuse, tobacco abuse, AAA, BPH, MAC infection, who presents with shortness of breath.pt was found to have WBC 12.4, BNP> 4500, troponin 77, 75, lactic acid 2.2, 1.8, negative COVID-19 PCR, electrolytes renal function okay, temperature normal, blood pressure 161/110, tachycardia, tachypnea, oxygen desaturation to 85% on 3 L home level of oxygen, requiring BiPAP.  Patient is admitted to stepdown as an inpatient.     Consultants:   None  Procedures:  CTA of chest:  1. No evidence acute pulmonary embolism. 2. Worsening airspace disease in the lingula and LEFT lower lobe suggesting pulmonary edema versus pneumonia. 3. Stable dense consolidation in the RIGHT upper lobe and diffuse airspace disease in the RIGHT lower lobe suggesting multifocal pneumonia. 4. Bilateral pleural effusions unchanged. 5. Ascending thoracic aortic aneurysm.  Antimicrobials:   Vanco and cefepime   Subjective: Patient reports having some chest pain today which had subsided.  Shortness of breath is little bit better but not at baseline.  Objective: Vitals:   04/29/19 1300 04/29/19 1400 04/29/19 1500 04/29/19 1600  BP: 111/77 126/86 122/82 122/85  Pulse: 70 79 87 73  Resp: (!) 22 (!) _0 Temp:    97.9 F (36.6 C)  TempSrc:    Oral  SpO2: 100% 100% 96% 100%  Weight:      Height:        Intake/Output Summary (Last 24 hours) at 04/29/2019 1804 Last data filed at 04/29/2019 1200 Gross per 24 hour  Intake 665.46 ml  Output 1750 ml  Net -1084.54 ml   Filed Weights   04/28/19 1024 04/28/19 1855  Weight: 49.9 kg 50.4 kg    Examination:  General  exam: Appears calm and comfortable , on [place] Respiratory system: Decreased breath sounds at bases, scattered crackles no wheezing Cardiovascular system: S1 & S2 heard, RRR. No JVD, murmurs, rubs, gallops or clicks.  Gastrointestinal system: Abdomen is nondistended, soft and nontender. Normal bowel sounds heard. Central nervous system: Alert and oriented. No focal neurological deficits. Extremities: Mild edema bilaterally Skin: Warm dry Psychiatry: Judgement and insight appear normal. Mood & affect appropriate.     Data Reviewed: I have personally reviewed following labs and imaging studies  CBC: Recent Labs  Lab 04/23/19 0504 04/27/19 1323 04/28/19 1037  WBC 8.9 8.0 12.4*  NEUTROABS  --  5.5  --   HGB 12.5* 13.0 13.2  HCT 37.7* 40.2 40.1  MCV 89.3 89.1 88.5  PLT 249 234 433   Basic Metabolic Panel: Recent Labs  Lab 04/23/19 0504 04/27/19 1323 04/28/19 1037 04/29/19 0512 04/29/19 1620  NA 136 136 135  --  139  K 4.1 4.0 3.7  --  3.8  CL 102 104 103  --  100  CO2 25 25 21*  --  24  GLUCOSE 78 88 120*  --  92  BUN _1 --  16  CREATININE 0.92 0.90 1.02  --  1.08  CALCIUM 8.2* 8.1* 8.4*  --  8.8*  MG  --   --   --  1.9 2.2   GFR: Estimated Creatinine Clearance: 50.6 mL/min (by C-G formula based on SCr of  1.08 mg/dL). Liver Function Tests: Recent Labs  Lab 04/27/19 1323  AST 16  ALT 12  ALKPHOS 72  BILITOT 0.8  PROT 7.0  ALBUMIN 3.0*   No results for input(s): LIPASE, AMYLASE in the last 168 hours. No results for input(s): AMMONIA in the last 168 hours. Coagulation Profile: No results for input(s): INR, PROTIME in the last 168 hours. Cardiac Enzymes: No results for input(s): CKTOTAL, CKMB, CKMBINDEX, TROPONINI in the last 168 hours. BNP (last 3 results) No results for input(s): PROBNP in the last 8760 hours. HbA1C: Recent Labs    04/29/19 0512  HGBA1C 5.6   CBG: Recent Labs  Lab 04/28/19 1855  GLUCAP 116*   Lipid Profile: Recent Labs     04/29/19 0512  CHOL 218*  HDL 37*  LDLCALC 161*  TRIG 99  CHOLHDL 5.9   Thyroid Function Tests: No results for input(s): TSH, T4TOTAL, FREET4, T3FREE, THYROIDAB in the last 72 hours. Anemia Panel: No results for input(s): VITAMINB12, FOLATE, FERRITIN, TIBC, IRON, RETICCTPCT in the last 72 hours. Sepsis Labs: Recent Labs  Lab 04/28/19 1037 04/28/19 1137 04/28/19 1302  PROCALCITON  --  <0.10  --   LATICACIDVEN 2.2*  --  1.8    Recent Results (from the past 240 hour(s))  Body fluid culture     Status: None   Collection Time: 04/22/19 12:13 PM   Specimen: PATH Cytology Pleural fluid  Result Value Ref Range Status   Specimen Description   Final    PLEURAL Performed at [facility], [address], [place], [place] [address]    Special Requests NONE  Final   Gram Stain   Final    FEW WBC PRESENT, PREDOMINANTLY MONONUCLEAR NO ORGANISMS SEEN    Culture   Final    NO GROWTH 3 DAYS Performed at [facility], [place] [address] [place], [place] [address]    Report Status 04/26/2019 FINAL  Final  Fungus Culture With Stain     Status: None (Preliminary result)   Collection Time: 04/22/19 12:13 PM   Specimen: PATH Cytology Pleural fluid  Result Value Ref Range Status   Fungus Stain Final report  Final    Comment: (NOTE) Performed At: [facility] [address] [place], [place] [id number] Rush Farmer MD [id number]    Fungus (Mycology) Culture PENDING  Incomplete   Fungal Source PLEURAL  Final    Comment: Performed at [facility], [address] [place], [place] [address]  Acid Fast Smear (AFB)     Status: None   Collection Time: 04/22/19 12:13 PM   Specimen: PATH Cytology Pleural fluid  Result Value Ref Range Status   AFB Specimen Processing Concentration  Final   Acid Fast Smear Negative  Final    Comment: (NOTE) Performed At: [facility] [address] [place], [place] [id number] Rush Farmer MD [id number]     Source (AFB) PLEURAL  Final    Comment: Performed at [facility], [address] [place], [place] [address]  Fungus Culture Result     Status: None   Collection Time: 04/22/19 12:13 PM  Result Value Ref Range Status   Result 1 Comment  Final    Comment: (NOTE) KOH/Calcofluor preparation:  no fungus observed. Performed At: [facility] [place], [place] [id number] Rush Farmer MD XJ:[id number]   Blood culture (routine x 2)     Status: None (Preliminary result)   Collection Time: 04/28/19 10:37 AM   Specimen: BLOOD  RIGHT ARM  Result Value Ref Range Status   Specimen Description BLOOD RIGHT ARM  Final   Special Requests   Final    BOTTLES DRAWN AEROBIC AND ANAEROBIC Blood Culture adequate volume   Culture   Final    NO GROWTH < 24 HOURS Performed at [facility], [address] [place], [place] [address]    Report Status PENDING  Incomplete  Blood culture (routine x 2)     Status: None (Preliminary result)   Collection Time: 04/28/19 10:37 AM   Specimen: BLOOD LEFT ARM  Result Value Ref Range Status   Specimen Description BLOOD LEFT ARM  Final   Special Requests   Final    BOTTLES DRAWN AEROBIC AND ANAEROBIC Blood Culture results may not be optimal due to an inadequate volume of blood received in culture bottles   Culture   Final    NO GROWTH < 24 HOURS Performed at [facility], [address], [place], [place] [address]    Report Status PENDING  Incomplete  Respiratory Panel by RT PCR (Flu A&B, Covid) - Nasopharyngeal Swab     Status: None   Collection Time: 04/28/19 11:37 AM   Specimen: Nasopharyngeal Swab  Result Value Ref Range Status   SARS Coronavirus 2 by RT PCR NEGATIVE NEGATIVE Final    Comment: (NOTE) SARS-CoV-2 target nucleic acids are NOT DETECTED. The SARS-CoV-2 RNA is generally detectable in upper respiratoy specimens during the acute phase of infection. The lowest concentration of SARS-CoV-2 viral  copies this assay Peters detect is 131 copies/mL. A negative result does not preclude SARS-Cov-2 infection and should not be used as the sole basis for treatment or other patient management decisions. A negative result may occur with  improper specimen collection/handling, submission of specimen other than nasopharyngeal swab, presence of viral mutation(s) within the areas targeted by this assay, and inadequate number of viral copies (<131 copies/mL). A negative result must be combined with clinical observations, patient history, and epidemiological information. The expected result is Negative. Fact Sheet for Patients:  PinkCheek.be Fact Sheet for Healthcare Providers:  GravelBags.it This test is not yet ap proved or cleared by the [place] FDA and  has been authorized for detection and/or diagnosis of SARS-CoV-2 by FDA under an Emergency Use Authorization (EUA). This EUA will remain  in effect (meaning this test Peters be used) for the duration of the COVID-19 declaration under Section 564(b)(1) of the Act, 21 U.S.C. section 360bbb-3(b)(1), unless the authorization is terminated or revoked sooner.    Influenza A by PCR NEGATIVE NEGATIVE Final   Influenza B by PCR NEGATIVE NEGATIVE Final    Comment: (NOTE) The Xpert Xpress SARS-CoV-2/FLU/RSV assay is intended as an aid in  the diagnosis of influenza from Nasopharyngeal swab specimens and  should not be used as a sole basis for treatment. Nasal washings and  aspirates are unacceptable for Xpert Xpress SARS-CoV-2/FLU/RSV  testing. Fact Sheet for Patients: PinkCheek.be Fact Sheet for Healthcare Providers: GravelBags.it This test is not yet approved or cleared by the [place] FDA and  has been authorized for detection and/or diagnosis of SARS-CoV-2 by  FDA under an Emergency Use Authorization (EUA). This EUA will  remain  in effect (meaning this test Peters be used) for the duration of the  Covid-19 declaration under Section 564(b)(1) of the Act, 21  U.S.C. section 360bbb-3(b)(1), unless the authorization is  terminated or revoked. Performed at [facility], [address] [place],  [address]  Radiology Studies: CT Angio Chest PE W/Cm &/Or Wo Cm  Result Date: 04/28/2019 CLINICAL DATA:  Short of breath.  Tachycardia. EXAM: CT ANGIOGRAPHY CHEST WITH CONTRAST TECHNIQUE: Multidetector CT imaging of the chest was performed using the standard protocol during bolus administration of intravenous contrast. Multiplanar CT image reconstructions and MIPs were obtained to evaluate the vascular anatomy. CONTRAST:  75 mL Omnipaque COMPARISON:  Chest CT 04/16/2019 FINDINGS: Cardiovascular: No filling defects within the pulmonary arteries to suggest acute pulmonary embolism. Ascending aorta is aneurysmal at 42 mm (image 30/7). Aneurysm is fusiform. No axillary supraclavicular adenopathy. No mediastinal hilar adenopathy. No pericardial effusion. Mediastinum/Nodes: No axillary supraclavicular adenopathy. No mediastinal adenopathy. Lungs/Pleura: Severe centrilobular emphysema. Superimposed consolidation in the RIGHT upper lobe not changed from prior. Small central cavitation is also similar (image 26/6). Pleural fluid at the RIGHT lung base and airspace disease in the RIGHT lower lobe is also similar. Within the LEFT lung, moderate pleural effusion. New airspace disease in the lower lobe and lingula. These findings in LEFT lung are worsened from comparison exam. Upper Abdomen: Limited view of the liver, kidneys, pancreas are unremarkable. Normal adrenal glands. Musculoskeletal: No aggressive osseous lesion. Review of the MIP images confirms the above findings. IMPRESSION: 1. No evidence acute pulmonary embolism. 2. Worsening airspace disease in the lingula and LEFT lower lobe suggesting pulmonary edema  versus pneumonia. 3. Stable dense consolidation in the RIGHT upper lobe and diffuse airspace disease in the RIGHT lower lobe suggesting multifocal pneumonia. 4. Bilateral pleural effusions unchanged. 5. Ascending thoracic aortic aneurysm. Recommend annual imaging followup by CTA or MRA. This recommendation follows 2010 ACCF/AHA/AATS/ACR/ASA/SCA/SCAI/SIR/STS/SVM Guidelines for the Diagnosis and Management of Patients with Thoracic Aortic Disease. Circulation. 2010; 121: F026-V785. Aortic aneurysm NOS (ICD10-I71.9) Electronically Signed   By: Suzy Bouchard M.D.   On: 04/28/2019 12:47   DG Chest Portable 1 View  Result Date: 04/28/2019 CLINICAL DATA:  Shortness of breath. EXAM: PORTABLE CHEST 1 VIEW COMPARISON:  04/27/2019 FINDINGS: The cardiac silhouette, mediastinal and hilar contours are within normal limits and stable. Stable tortuosity of the thoracic aorta. Stable underlying chronic lung disease with emphysema and pulmonary scarring. Persistent superimposed bilateral infiltrates with persistent right upper lobe airspace consolidation and air bronchograms. Small right pleural effusion. No pneumothorax. IMPRESSION: Stable appearing underlying chronic lung disease/emphysema and pulmonary scarring with superimposed infiltrates and small right effusion. Electronically Signed   By: Marijo Sanes M.D.   On: 04/28/2019 10:56        Scheduled Meds: . aspirin EC  81 mg Oral Daily  . Chlorhexidine Gluconate Cloth  6 each Topical Daily  . dextromethorphan-guaiFENesin  1 tablet Oral BID  . enoxaparin (LOVENOX) injection  40 mg Subcutaneous Q24H  . ethambutol  800 mg Oral Daily  . finasteride  5 mg Oral Daily  . FLUoxetine  20 mg Oral Daily  . fluticasone furoate-vilanterol  2 puff Inhalation QHS   And  . umeclidinium bromide  2 puff Inhalation QHS  . folic acid  1 mg Oral Daily  . furosemide  20 mg Intravenous Q12H  . gabapentin  900 mg Oral QHS  . ipratropium-albuterol  3 mL Nebulization Q6H  .  LORazepam  0-4 mg Intravenous Q6H   Followed by  . [START ON 04/30/2019] LORazepam  0-4 mg Intravenous Q12H  . losartan  25 mg Oral Daily  . methylPREDNISolone (SOLU-MEDROL) injection  40 mg Intravenous Q12H  . metoprolol succinate  25 mg Oral Daily  . mirtazapine  7.5 mg Oral QHS  .  montelukast  10 mg Oral QHS  . multivitamin with minerals  1 tablet Oral Daily  . nicotine  21 mg Transdermal Q24H  . pantoprazole  40 mg Oral Daily  . rifampin  600 mg Oral Daily  . roflumilast  250 mcg Oral QHS  . rosuvastatin  5 mg Oral QHS  . tamsulosin  0.4 mg Oral QPC supper  . thiamine  100 mg Oral Daily   Or  . thiamine  100 mg Intravenous Daily   Continuous Infusions: . ceFEPime (MAXIPIME) IV Stopped (04/29/19 0955)  . vancomycin      Assessment & Plan:   Principal Problem:   Acute on chronic respiratory failure with hypoxia (HCC) Active Problems:   COPD exacerbation (HCC)   Essential hypertension   Hyperlipidemia   Gastroesophageal reflux disease without esophagitis   History of MAC infection   Acute on chronic systolic CHF (congestive heart failure) (HCC)   Elevated troponin   HCAP (healthcare-associated pneumonia)   Sepsis ([place])   CAD (coronary artery disease)   BPH (benign prostatic hyperplasia)   Depression   Acute on chronic respiratory failure with hypoxia: Likely due to multifactorial etiology, including COPD exacerbation, possible HCAP, acute CHF exacerbation and history of MAC infection.  Patient has leukocytosis, bilateral infiltration on CT angiogram, indicating possible HCAP.   Elevated BNP>4500 Not much wheezing on exam, I do not think he has much of copd exacerbation. Will d/c solumedrol  -Continue BiPAP -Bronchodilators -IV diuretics -Nasal cannula oxygen to maintain oxygen saturation above 93% when patient is off BiPAP  Possible HCAP: -Continue IV Vancomycin and cefepime (patient received 1 dose of Rocephin and azithromycin) - Mucinex for cough  -  Bronchodilators - Urine legionella and S. pneumococcal antigen - Follow up blood culture x2, sputum culture   History of MAC infection: -continue ethambutol and rifampin  Sepsis: Patient meets critical for sepsis with leukocytosis, tachycardia and tachypnea.  Lactic acid elevated 2.2, which is normalized now. -Will not give IV fluid since patient has a CHF exacerbation -Follow-up of blood culture -hemodynamics stable.  Acute on chronic systolic CHF: 2D echo on 08/29/1421 showed EF 35-40% -Volume overloaded. Continue IV Lasix 20 mg twice daily, increase if bp allows D/c solumedrol  HTN:  -Continue home medications: -hydralazine prn  Hyperlipidemia -Crestor  Gastroesophageal reflux disease without esophagitis -Protonix  Elevated troponin and hx of CAD: Patient has had some left-sided chest pain.  CT angiogram is negative for PE.  Troponin elevated.  Likely due to demand ischemia.  -Crestor, metoprolol -Aspirin -Trend troponin -will check A1c, FLP -cardiology consult as pt tells me he was supposed to follow-up with cards this week and he is here.  BPH: stable - Continue Flomax and Proscar   Depression: Stable, no suicidal or homicidal ideations. -Continue home medications  DVT prophylaxis: Lovenox Code Status: Full Family Communication: None at bedside Disposition Plan: Back home, and likely to days as he is still volume overloaded and not at baseline.  Also treating for pneumonia and has elevated troponin . Barriers: not medically stable as he is still volume overloaded, has pna, and needs to be more stable from cardiac stand point.       LOS: 1 day   Time spent: 45 minutes with more than 50% on [place], MD [facility] Pager [phone]  If 7PM-7AM, please contact night-coverage [email or web address] Password [facility] 04/29/2019, 6:04 PM

## 2019-04-29 NOTE — Consult Note (Signed)
Pharmacy Antibiotic Note  Joseph Peters is a 64 y.o. male admitted on 04/28/2019 with shortness of breath. Patient recently admitted with COPD exacerbation and PMH of MAC. Pharmacy has been consulted for cefepime and vancomycin dosing.  Plan: Vancomycin: continue vancomycin 725m IV Q24hr. Will continue to follow serum creatinine daily. Will obtain peak/trough as clinically indicated. MRSA PCR is pending; will follow up with results on 3/4.   Cefepime: continue 2g IV every 12 hours.   Height: _0  (165.1 cm) Weight: 111 lb 1.8 oz (50.4 kg) IBW/kg (Calculated) : 61.5  Temp (24hrs), Avg:98 F (36.7 C), Min:97.9 F (36.6 C), Max:98 F (36.7 C)  Recent Labs  Lab 04/23/19 0504 04/27/19 1323 04/28/19 1037 04/28/19 1302  WBC 8.9 8.0 12.4*  --   CREATININE 0.92 0.90 1.02  --   LATICACIDVEN  --   --  2.2* 1.8    Estimated Creatinine Clearance: 53.5 mL/min (by C-G formula based on SCr of 1.02 mg/dL).    Allergies  Allergen Reactions  . Penicillins Swelling, Rash and Other (See Comments)    Swelling in throat  Did it involve swelling of the face/tongue/throat, SOB, or low BP? yes Did it involve sudden or severe rash/hives, skin peeling, or any reaction on the inside of your mouth or nose? no Did you need to seek medical attention at a hospital or doctor's office? Yes When did it last happen?years  If all above answers are "NO", may proceed with cephalosporin use.    . Lidocaine Other (See Comments)    Unsure  At dentist's office    Antimicrobials this admission: 3/2 azithro/ceftriaxone x 1 3/2 vancomycin >> 3/2 cefepime >>   Microbiology results: 3/2 SARS Coronavirus 2: Negative 3/2 Influenza A/B: negative   3/2 Bcx: no growth < 24 hours  3/3 MRSA PCR: pending   Thank you for allowing pharmacy to be a part of this patient's care.  Sherill Wegener L 04/29/2019 3:03 PM

## 2019-04-29 NOTE — TOC Initial Note (Signed)
Transition of Care [facility]) - Initial/Assessment Note    Patient Details  Name: Joseph Peters MRN: [id number] Date of Birth: May 21, 1955  Transition of Care [facility]) CM/SW Contact:    Magnus Ivan, LCSW Phone Number: 04/29/2019, 1:37 PM  Clinical Narrative:                CSW met with patient at bedside. Explained CSW role. Patient reported he lives at home with his wife. Patient said he drives himself to appointments. Patient could not remember his PCP but said he was supposed to have an appointment with them this week. PCP is Dr. Janene Harvey per chart. Patient reported he uses [facility] in [place] and denied issues with obtaining medications. Patient said he has a walker, oxygen, Trilogy, and Afflovest at home. Patient reported he was active with [facility] for home health services and would like to continue these services upon discharge. CSW spoke with [facility] Representative Corene Cornea who reported patient receives aide, RN, and PT services and confirmed they can continue these services at discharge. CSW encouraged patient to reach out with any needs.   Expected Discharge Plan: [place] Barriers to Discharge: Continued Medical Work up   Patient Goals and CMS Choice Patient states their goals for this hospitalization and ongoing recovery are:: to return home and resume home health services.      Expected Discharge Plan and Services Expected Discharge Plan: [place] arrangements for the past 2 months: Single Family Home                               Date [place]: 04/29/19 Time HH Agency Contacted: 51 Representative spoke with at [place]: [place] Arrangements/Services Living arrangements for the past 2 months: [place] Lives with:: Spouse Patient language and need for interpreter reviewed:: Yes Do you feel safe going back to the place where you live?: Yes      Need for Family Participation in  Patient Care: Yes (Comment) Care giver support system in place?: Yes (comment) Current home services: DME, Home PT, Home RN, Homehealth aide Criminal Activity/Legal Involvement Pertinent to Current Situation/Hospitalization: No - Comment as needed  Activities of Daily Living Home Assistive Devices/Equipment: Environmental consultant (specify type) ADL Screening (condition at time of admission) Patient's cognitive ability adequate to safely complete daily activities?: Yes Is the patient deaf or have difficulty hearing?: No Does the patient have difficulty seeing, even when wearing glasses/contacts?: No Does the patient have difficulty concentrating, remembering, or making decisions?: No Patient able to express need for assistance with ADLs?: Yes Does the patient have difficulty dressing or bathing?: No Independently performs ADLs?: Yes (appropriate for developmental age) Communication: Independent Dressing (OT): Independent Grooming: Independent Feeding: Independent Bathing: Independent Toileting: Independent In/Out Bed: Independent Walks in Home: Independent with device (comment) Does the patient have difficulty walking or climbing stairs?: Yes Weakness of Legs: Both Weakness of Arms/Hands: Both  Permission Sought/Granted         Permission granted to share info w AGENCY: [place], [facility]        Emotional Assessment Appearance:: Appears stated age Attitude/Demeanor/Rapport: Engaged, Gracious Affect (typically observed): Calm, Appropriate Orientation: : Oriented to Self, Oriented to Place, Oriented to  Time, Oriented to Situation   Psych Involvement: No (comment)  Admission diagnosis:  Shortness of breath [R06.02] Hypoxia [R09.02] HCAP (healthcare-associated pneumonia) [J18.9]  Patient Active Problem List   Diagnosis Date Noted  . Acute on chronic systolic CHF (congestive heart failure) ([place]) 04/28/2019  . Elevated troponin 04/28/2019  . HCAP (healthcare-associated  pneumonia) 04/28/2019  . Sepsis ([place]) 04/28/2019  . CAD (coronary artery disease) 04/28/2019  . BPH (benign prostatic hyperplasia) 04/28/2019  . Depression 04/28/2019  . History of MAC infection   . Pleural effusion on right   . SOB (shortness of breath)   . Anxiety about health   . Dyspnea   . Acute on chronic respiratory failure with hypoxia ([place])   . Goals of care, counseling/discussion   . Advanced care planning/counseling discussion   . Palliative care by specialist   . Restless legs syndrome (RLS) 04/01/2019  . Protein-calorie malnutrition, severe 10/15/2018  . Nodule of upper lobe of right lung 10/15/2018  . RLQ abdominal pain 10/14/2018  . Status post total hip replacement, right 07/01/2018  . Strain of right knee 06/26/2018  . Lumbar strain 06/16/2018  . Strain of right hip 06/16/2018  . Hyperlipidemia 01/30/2018  . Rotator cuff tendinitis, right 08/19/2017  . Traumatic complete tear of right rotator cuff 08/19/2017  . Ascending aortic aneurysm ([place]) 08/01/2017  . Tremor, essential 05/28/2017  . Gastroesophageal reflux disease without esophagitis 12/25/2016  . Renal artery stenosis ([place]) 12/05/2015  . 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 Syncope and collapse 12/05/2015  . Essential hypertension 10/15/2015  . COPD exacerbation ([place]) 01/31/2015  . Acute respiratory failure ([place]) 01/31/2015  . Drug allergy 01/30/2015  . AAA (abdominal aortic aneurysm) ([place]) 11/23/2014  . Prostate cancer screening 11/02/2014  . Gross hematuria 11/02/2014  . Benign hypertension 07/16/2014  . 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 Occlusion and stenosis of unspecified carotid artery 05/14/2012  . Coccydynia 02/08/2012  . 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 Gait disorder 02/08/2012  . Spinal stenosis of lumbar region 01/13/2009  . Atherosclerotic heart disease of native coronary artery without angina pectoris  06/28/2008  . Sciatica 06/28/2008  . Recurrent major depressive disorder in partial remission ([place]) 06/28/2008   PCP:  Barbaraann Boys, MD Pharmacy:   [facility] [phone] - [address], [address] [place] [address] Phone: [phone] Fax: [phone]     Social Determinants of Health (SDOH) Interventions    Readmission Risk Interventions Readmission Risk Prevention Plan 04/29/2019 04/21/2019  Transportation Screening Complete Complete  PCP or Specialist Appt within 3-5 Days - Complete  HRI or [place] - Complete  Palliative Care Screening - Complete  Medication Review ([place]) Complete Referral to Pharmacy  PCP or Specialist appointment within 3-5 days of discharge Complete -  [place] or Home Care Consult Complete -  Some recent data might be hidden

## 2019-04-29 NOTE — Progress Notes (Signed)
[place] visited pt. as on ICU follow up from previous visits w/pt. in last month.  Pt. in bed sitting up, glad to see [place]; Pt. shared he continues to struggle w/double pneumonia and had to be admitted after 'another episode'.  Pt. discussed his fondness for spending time with his [place] property [place], camping and eating together, riding horses.  Pt. continues to seem well supported by family members, eager to return home.  Pt. described family tradition of having in-home wake after family members die; when [facility] asked if pt. would like this to be done for him when he dies, he replied affirmatively and said his wife knew of these wishes.  [place] will follow up tomorrow if possible.    04/29/19 1445  Clinical Encounter Type  Visited With Patient  Visit Type Follow-up;Social support;Critical Care  Spiritual Encounters  Spiritual Needs Emotional  Stress Factors  Patient Stress Factors Health changes;Loss of control;Major life changes

## 2019-04-30 LAB — CREATININE, SERUM
Creatinine, Ser: 1.12 mg/dL (ref 0.61–1.24)
GFR calc Af Amer: >60 mL/min (ref >60)
GFR calc non Af Amer: >60 mL/min (ref >60)

## 2019-04-30 LAB — LEGIONELLA PNEUMOPHILA SEROGP 1 UR AG: L. pneumophila Serogp 1 Ur Ag: NEGATIVE

## 2019-04-30 MED ORDER — FUROSEMIDE 10 MG/ML IJ SOLN
20.0000 mg | Freq: Once | INTRAMUSCULAR | Status: AC
  Administered 2019-04-30: 20 mg via INTRAVENOUS
  Filled 2019-04-30: qty 2

## 2019-04-30 MED ORDER — FUROSEMIDE 10 MG/ML IJ SOLN
40.0000 mg | Freq: Two times a day (BID) | INTRAMUSCULAR | Status: DC
  Administered 2019-04-30 – 2019-05-02 (×4): 40 mg via INTRAVENOUS
  Filled 2019-04-30 (×4): qty 4

## 2019-04-30 MED ORDER — METOPROLOL SUCCINATE ER 25 MG PO TB24
12.5000 mg | ORAL_TABLET | Freq: Every day | ORAL | Status: DC
  Administered 2019-05-01 – 2019-05-03 (×3): 12.5 mg via ORAL
  Filled 2019-04-30: qty 0.5
  Filled 2019-04-30 (×2): qty 1

## 2019-04-30 NOTE — Progress Notes (Signed)
PROGRESS NOTE    TADAO EMIG  GEX:[id number] DOB: [birth year] DOA: 04/28/2019 PCP: Barbaraann Boys, MD    Brief Narrative:  Joseph Peters is a 64 y.o. male with medical history significant of hypertension, hyperlipidemia, COPD on 3 L nasal cannula oxygen at home, asthma, stroke, GERD, depression, anxiety, RLS, CAD, CHF with EF 35-40%, IBS, alcohol abuse, tobacco abuse, AAA, BPH, MAC infection, who presents with shortness of breath.pt was found to have WBC 12.4, BNP> 4500, troponin 77, 75, lactic acid 2.2, 1.8, negative COVID-19 PCR, electrolytes renal function okay, temperature normal, blood pressure 161/110, tachycardia, tachypnea, oxygen desaturation to 85% on 3 L home level of oxygen, requiring BiPAP.  Patient is admitted to stepdown as an inpatient.     Consultants:   None  Procedures:  CTA of chest:  1. No evidence acute pulmonary embolism. 2. Worsening airspace disease in the lingula and LEFT lower lobe suggesting pulmonary edema versus pneumonia. 3. Stable dense consolidation in the RIGHT upper lobe and diffuse airspace disease in the RIGHT lower lobe suggesting multifocal pneumonia. 4. Bilateral pleural effusions unchanged. 5. Ascending thoracic aortic aneurysm.  Antimicrobials:   Vanco and cefepime   Subjective: NO cp today. Still with sob, but little better. Reports urinating a lot  Objective: Vitals:   04/30/19 1503 04/30/19 1504 04/30/19 1505 04/30/19 1600  BP: (!) 81/62  (!) 88/62 93/74  Pulse:   80 79  Resp: (!) _0 Temp:      TempSrc:      SpO2:  (!) 9% 92% 98%  Weight:      Height:        Intake/Output Summary (Last 24 hours) at 04/30/2019 1641 Last data filed at 04/30/2019 1626 Gross per 24 hour  Intake 711.09 ml  Output 1950 ml  Net -1238.91 ml   Filed Weights   04/28/19 1024 04/28/19 1855  Weight: 49.9 kg 50.4 kg    Examination:  General exam: Appears calm and comfortable , on [place] Respiratory system: Decreased breath sounds at  bases, scattered crackles no wheezing or rhonchi Cardiovascular system: S1 & S2 heard, RRR.+ JVD,  No murmurs, rubs, gallops or clicks.  Gastrointestinal system: Abdomen is nondistended, soft and nontender. Normal bowel sounds heard. Central nervous system: Alert and oriented. No focal neurological deficits. Extremities: No edema Skin: Warm dry Psychiatry: Judgement and insight appear normal. Mood & affect appropriate.     Data Reviewed: I have personally reviewed following labs and imaging studies  CBC: Recent Labs  Lab 04/27/19 1323 04/28/19 1037  WBC 8.0 12.4*  NEUTROABS 5.5  --   HGB 13.0 13.2  HCT 40.2 40.1  MCV 89.1 88.5  PLT 234 010   Basic Metabolic Panel: Recent Labs  Lab 04/27/19 1323 04/28/19 1037 04/29/19 0512 04/29/19 1620 04/30/19 0409  NA 136 135  --  139  --   K 4.0 3.7  --  3.8  --   CL 104 103  --  100  --   CO2 25 21*  --  24  --   GLUCOSE 88 120*  --  92  --   BUN 14 16  --  16  --   CREATININE 0.90 1.02  --  1.08 1.12  CALCIUM 8.1* 8.4*  --  8.8*  --   MG  --   --  1.9 2.2  --    GFR: Estimated Creatinine Clearance: 48.8 mL/min (by C-G formula based on SCr of 1.12 mg/dL). Liver Function  Tests: Recent Labs  Lab 04/27/19 1323  AST 16  ALT 12  ALKPHOS 72  BILITOT 0.8  PROT 7.0  ALBUMIN 3.0*   No results for input(s): LIPASE, AMYLASE in the last 168 hours. No results for input(s): AMMONIA in the last 168 hours. Coagulation Profile: No results for input(s): INR, PROTIME in the last 168 hours. Cardiac Enzymes: No results for input(s): CKTOTAL, CKMB, CKMBINDEX, TROPONINI in the last 168 hours. BNP (last 3 results) No results for input(s): PROBNP in the last 8760 hours. HbA1C: Recent Labs    04/29/19 0512  HGBA1C 5.6   CBG: Recent Labs  Lab 04/28/19 1855  GLUCAP 116*   Lipid Profile: Recent Labs    04/29/19 0512  CHOL 218*  HDL 37*  LDLCALC 161*  TRIG 99  CHOLHDL 5.9   Thyroid Function Tests: No results for input(s):  TSH, T4TOTAL, FREET4, T3FREE, THYROIDAB in the last 72 hours. Anemia Panel: No results for input(s): VITAMINB12, FOLATE, FERRITIN, TIBC, IRON, RETICCTPCT in the last 72 hours. Sepsis Labs: Recent Labs  Lab 04/28/19 1037 04/28/19 1137 04/28/19 1302  PROCALCITON  --  <0.10  --   LATICACIDVEN 2.2*  --  1.8    Recent Results (from the past 240 hour(s))  Body fluid culture     Status: None   Collection Time: 04/22/19 12:13 PM   Specimen: PATH Cytology Pleural fluid  Result Value Ref Range Status   Specimen Description   Final    PLEURAL Performed at [facility], [address], [place], [place] [address]    Special Requests NONE  Final   Gram Stain   Final    FEW WBC PRESENT, PREDOMINANTLY MONONUCLEAR NO ORGANISMS SEEN    Culture   Final    NO GROWTH 3 DAYS Performed at [facility], [place] [address], [place], [place] [address]    Report Status 04/26/2019 FINAL  Final  Fungus Culture With Stain     Status: None (Preliminary result)   Collection Time: 04/22/19 12:13 PM   Specimen: PATH Cytology Pleural fluid  Result Value Ref Range Status   Fungus Stain Final report  Final    Comment: (NOTE) Performed At: [facility] [address] [place], [place] [id number] Rush Farmer MD TK:[id number]    Fungus (Mycology) Culture PENDING  Incomplete   Fungal Source PLEURAL  Final    Comment: Performed at [facility], [address] [place], [place] [address]  Acid Fast Smear (AFB)     Status: None   Collection Time: 04/22/19 12:13 PM   Specimen: PATH Cytology Pleural fluid  Result Value Ref Range Status   AFB Specimen Processing Concentration  Final   Acid Fast Smear Negative  Final    Comment: (NOTE) Performed At: [facility] [address] [place], [place] [id number] Rush Farmer MD RF:[id number]    Source (AFB) PLEURAL  Final    Comment: Performed at [facility], [address] [place], [place] [address]  Fungus  Culture Result     Status: None   Collection Time: 04/22/19 12:13 PM  Result Value Ref Range Status   Result 1 Comment  Final    Comment: (NOTE) KOH/Calcofluor preparation:  no fungus observed. Performed At: [facility] [place], [place] [id number] Rush Farmer MD PQ:[id number]   Blood culture (routine x 2)     Status: None (Preliminary result)   Collection Time: 04/28/19 10:37 AM   Specimen: BLOOD RIGHT ARM  Result  Value Ref Range Status   Specimen Description BLOOD RIGHT ARM  Final   Special Requests   Final    BOTTLES DRAWN AEROBIC AND ANAEROBIC Blood Culture adequate volume   Culture   Final    NO GROWTH 2 DAYS Performed at [facility], [address] [place], [place] [address]    Report Status PENDING  Incomplete  Blood culture (routine x 2)     Status: None (Preliminary result)   Collection Time: 04/28/19 10:37 AM   Specimen: BLOOD LEFT ARM  Result Value Ref Range Status   Specimen Description BLOOD LEFT ARM  Final   Special Requests   Final    BOTTLES DRAWN AEROBIC AND ANAEROBIC Blood Culture results may not be optimal due to an inadequate volume of blood received in culture bottles   Culture   Final    NO GROWTH 2 DAYS Performed at [facility], [address] [place], [place] [address]    Report Status PENDING  Incomplete  Respiratory Panel by RT PCR (Flu A&B, Covid) - Nasopharyngeal Swab     Status: None   Collection Time: 04/28/19 11:37 AM   Specimen: Nasopharyngeal Swab  Result Value Ref Range Status   SARS Coronavirus 2 by RT PCR NEGATIVE NEGATIVE Final    Comment: (NOTE) SARS-CoV-2 target nucleic acids are NOT DETECTED. The SARS-CoV-2 RNA is generally detectable in upper respiratoy specimens during the acute phase of infection. The lowest concentration of SARS-CoV-2 viral copies this assay can detect is 131 copies/mL. A negative result does not preclude SARS-Cov-2 infection and should not be used as the sole  basis for treatment or other patient management decisions. A negative result may occur with  improper specimen collection/handling, submission of specimen other than nasopharyngeal swab, presence of viral mutation(s) within the areas targeted by this assay, and inadequate number of viral copies (<131 copies/mL). A negative result must be combined with clinical observations, patient history, and epidemiological information. The expected result is Negative. Fact Sheet for Patients:  PinkCheek.be Fact Sheet for Healthcare Providers:  GravelBags.it This test is not yet ap proved or cleared by the [place] FDA and  has been authorized for detection and/or diagnosis of SARS-CoV-2 by FDA under an Emergency Use Authorization (EUA). This EUA will remain  in effect (meaning this test can be used) for the duration of the COVID-19 declaration under Section 564(b)(1) of the Act, 21 U.S.C. section 360bbb-3(b)(1), unless the authorization is terminated or revoked sooner.    Influenza A by PCR NEGATIVE NEGATIVE Final   Influenza B by PCR NEGATIVE NEGATIVE Final    Comment: (NOTE) The Xpert Xpress SARS-CoV-2/FLU/RSV assay is intended as an aid in  the diagnosis of influenza from Nasopharyngeal swab specimens and  should not be used as a sole basis for treatment. Nasal washings and  aspirates are unacceptable for Xpert Xpress SARS-CoV-2/FLU/RSV  testing. Fact Sheet for Patients: PinkCheek.be Fact Sheet for Healthcare Providers: GravelBags.it This test is not yet approved or cleared by the [place] FDA and  has been authorized for detection and/or diagnosis of SARS-CoV-2 by  FDA under an Emergency Use Authorization (EUA). This EUA will remain  in effect (meaning this test can be used) for the duration of the  Covid-19 declaration under Section 564(b)(1) of the Act, 21  U.S.C.  section 360bbb-3(b)(1), unless the authorization is  terminated or revoked. Performed at [facility], [address] [place],  [address]   MRSA PCR Screening  Status: None   Collection Time: 04/29/19  4:52 PM   Specimen: Nasal Mucosa; Nasopharyngeal  Result Value Ref Range Status   MRSA by PCR NEGATIVE NEGATIVE Final    Comment:        The GeneXpert MRSA Assay (FDA approved for NASAL specimens only), is one component of a comprehensive MRSA colonization surveillance program. It is not intended to diagnose MRSA infection nor to guide or monitor treatment for MRSA infections. Performed at [facility], [address], [place], [place] [address]          Radiology Studies: No results found.      Scheduled Meds: . aspirin EC  81 mg Oral Daily  . Chlorhexidine Gluconate Cloth  6 each Topical Daily  . dextromethorphan-guaiFENesin  1 tablet Oral BID  . enoxaparin (LOVENOX) injection  40 mg Subcutaneous Q24H  . ethambutol  800 mg Oral Daily  . finasteride  5 mg Oral Daily  . FLUoxetine  20 mg Oral Daily  . fluticasone furoate-vilanterol  2 puff Inhalation QHS  . folic acid  1 mg Oral Daily  . furosemide  40 mg Intravenous Q12H  . gabapentin  900 mg Oral QHS  . ipratropium-albuterol  3 mL Nebulization Q6H  . metoprolol succinate  25 mg Oral Daily  . mirtazapine  7.5 mg Oral QHS  . montelukast  10 mg Oral QHS  . multivitamin with minerals  1 tablet Oral Daily  . nicotine  21 mg Transdermal Q24H  . pantoprazole  40 mg Oral Daily  . rifampin  600 mg Oral Daily  . roflumilast  250 mcg Oral QHS  . rosuvastatin  5 mg Oral QHS  . tamsulosin  0.4 mg Oral QPC supper  . thiamine  100 mg Oral Daily   Or  . thiamine  100 mg Intravenous Daily   Continuous Infusions: . ceFEPime (MAXIPIME) IV Stopped (04/30/19 0959)  . vancomycin 750 mg (04/30/19 1626)    Assessment & Plan:   Principal Problem:   Acute on chronic respiratory failure with  hypoxia (HCC) Active Problems:   COPD exacerbation (HCC)   Essential hypertension   Hyperlipidemia   Gastroesophageal reflux disease without esophagitis   History of MAC infection   Acute on chronic systolic CHF (congestive heart failure) (HCC)   Elevated troponin   HCAP (healthcare-associated pneumonia)   Sepsis ([place])   CAD (coronary artery disease)   BPH (benign prostatic hyperplasia)   Depression   Acute on chronic respiratory failure with hypoxia: Likely due to multifactorial etiology, including COPD exacerbation, possible HCAP, acute CHF exacerbation and history of MAC infection.  Patient has leukocytosis, bilateral infiltration on CT angiogram, indicating possible HCAP.   Elevated BNP>4500 Not much wheezing on exam so Solu-Medrol was discontinued On nasal cannula for oxygen support keep O2> 92% -Bronchodilators -IV diuretics-increased to 40 mg twice daily   Possible HCAP: -Continue IV Vancomycin and cefepime (patient received 1 dose of Rocephin and azithromycin) - Mucinex for cough  - Bronchodilators - Urine legionella and S. pneumococcal antigen - Follow up blood culture x2, sputum culture   History of MAC infection: -continue ethambutol and rifampin  Sepsis: Patient meets critical for sepsis with leukocytosis, tachycardia and tachypnea.  Lactic acid elevated 2.2, which is normalized now. -Will not give IV fluid since patient has a CHF exacerbation -Follow-up of blood culture pending -hemodynamics stable.  Continue to monitor  Acute on chronic systolic CHF: 2D echo on 03/31/3005 showed EF 35-40% -Volume overloaded. We will increase  Lasix to 40 mg twice daily Strict I's and O's Daily weight Cardiology recommends conservative cardiac management and to follow-up as outpatient for heart failure cardiomyopathy management Hold losartan as bp dropped to sbp 80's. Decrease beta blk from 74m to 12.5 due to bp being low and needs to diurese  HTN:  -Continue home  medications: -hydralazine prn  Hyperlipidemia -Crestor  Gastroesophageal reflux disease without esophagitis -Protonix  Elevated troponin and hx of CAD: Patient has had some left-sided chest pain.  CT angiogram is negative for PE.  Troponin elevated.  Likely due to demand ischemia.  -Crestor, metoprolol -Aspirin -Trend troponin -will check A1c, FLP  BPH: stable - Continue Flomax and Proscar   Depression: Stable, no suicidal or homicidal ideations. -Continue home medications  DVT prophylaxis: Lovenox Code Status: Full Family Communication: None at bedside Disposition Plan: Back home, and likely to days as he is still volume overloaded and not at baseline.  Also treating for pneumonia and has elevated troponin . Barriers: not medically stable as he is still volume overloaded, has pna, and needs to be more stable from cardiac stand point.       LOS: 2 days   Time spent: 45 minutes with more than 50% on [place] MD [facility] Pager [phone]  If 7PM-7AM, please contact night-coverage [email or web address] Password [facility]/2019, 4:41 PM Patient ID: KRAYLON LAMSON male   DOB: [birth year] 63y.o.   MRN: [id number]

## 2019-04-30 NOTE — Consult Note (Signed)
CARDIOLOGY CONSULT NOTE               Patient ID: Joseph Peters MRN: [id number] DOB/AGE: 1956/01/15 64 y.o.  Admit date: 04/28/2019 Referring Physician Dr Ivor Costa hospitalist Primary Physician Dr. Barbaraann Boys primary Primary Cardiologist [facility] Reason for Consultation shortness of breath heart failure respiratory failure  HPI: Patient is a 64 year old male history of hypertension hyperlipidemia COPD on 3 L nasal cannula at home for hypoxemia asthma previous CVA GERD anxiety coronary disease congestive heart failure systolic dysfunction cardiomyopathy with ejection fraction between 35 to 40% still with tobacco and alcohol abuse presents now with worsening shortness of breath with known MAC infection and now possible hospital-acquired pneumonia.  Patient denies fever chills or sweats.  Patient had elevated BNP flat troponins negative for Covid his sats were as low as 85 and was placed on BiPAP to help with respiratory support patient was admitted and treated for sepsis pneumonia as well as respiratory failure.  Patient had recent admission last month did not follow-up with cardiology as an outpatient  Review of systems complete and found to be negative unless listed above     Past Medical History:  Diagnosis Date  . AAA (abdominal aortic aneurysm) without rupture ([place])   . Abdominal aneurysm ([place]) 2015   being followed but not large enough to surgically treat  . Alcoholism ([place])   . Anxiety   . Arthritis   . Asthma   . Benign hypertension 07/16/2014  . Chest pain on exertion 07/16/2014  . Chronic pain syndrome   . Coccydynia 02/08/2012  . COPD (chronic obstructive pulmonary disease) ([place])    patient smokes  . Coronary artery disease   . 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 Depression   . 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 Dyspnea   . Frequent falls   . GERD (gastroesophageal reflux disease)   . Hyperlipidemia   . IBS  (irritable bowel syndrome)   . Myocardial infarction ([place]) 2010  . Neuropathy   . Pneumonia   . Prostatitis   . Restless leg syndrome   . Sciatica   . Spinal stenosis of lumbar region   . Stroke ([place]) 2015   no residual symptoms  . Tremor, essential     Past Surgical History:  Procedure Laterality Date  . CARDIAC CATHETERIZATION  2012  . COLONOSCOPY WITH PROPOFOL N/A 11/14/2016   Procedure: COLONOSCOPY WITH PROPOFOL;  Surgeon: Manya Silvas, MD;  Location: [facility] ENDOSCOPY;  Service: Endoscopy;  Laterality: N/A;  . ESOPHAGOGASTRODUODENOSCOPY (EGD) WITH PROPOFOL N/A 11/14/2016   Procedure: ESOPHAGOGASTRODUODENOSCOPY (EGD) WITH PROPOFOL;  Surgeon: Manya Silvas, MD;  Location: [facility] ENDOSCOPY;  Service: Endoscopy;  Laterality: N/A;  . JOINT REPLACEMENT Right    hip  . KNEE SURGERY     right  . SHOULDER ARTHROSCOPY WITH OPEN ROTATOR CUFF REPAIR Right 08/20/2017   Procedure: SHOULDER ARTHROSCOPY WITH OPEN ROTATOR CUFF REPAIR;  Surgeon: Corky Mull, MD;  Location: [facility] ORS;  Service: Orthopedics;  Laterality: Right;  . SHOULDER ARTHROSCOPY WITH SUBACROMIAL DECOMPRESSION, ROTATOR CUFF REPAIR AND BICEP TENDON REPAIR Left 05/10/2016   Procedure: SHOULDER ARTHROSCOPY WITH DEBTRIDEMENT, DECOMPRESSION, REPAIR OF MASSIVE ROTATOR CUFF TEAR AND BICEP TENODESIS;  Surgeon: Corky Mull, MD;  Location: [facility] ORS;  Service: Orthopedics;  Laterality: Left;  Massive cuff tear  . TOTAL HIP ARTHROPLASTY Right 07/01/2018   Procedure: TOTAL HIP ARTHROPLASTY - RIGHT;  Surgeon: Corky Mull, MD;  Location:  [facility] ORS;  Service: Orthopedics;  Laterality: Right;  Marland Kitchen VIDEO BRONCHOSCOPY WITH ENDOBRONCHIAL NAVIGATION N/A 02/13/2019   Procedure: VIDEO BRONCHOSCOPY WITH ENDOBRONCHIAL NAVIGATION;  Surgeon: Ottie Glazier, MD;  Location: [facility] ORS;  Service: Thoracic;  Laterality: N/A;  . VIDEO BRONCHOSCOPY WITH ENDOBRONCHIAL NAVIGATION N/A 03/13/2019   Procedure: VIDEO BRONCHOSCOPY WITH ENDOBRONCHIAL NAVIGATION;   Surgeon: Ottie Glazier, MD;  Location: [facility] ORS;  Service: Thoracic;  Laterality: N/A;  . VIDEO BRONCHOSCOPY WITH ENDOBRONCHIAL ULTRASOUND N/A 02/13/2019   Procedure: VIDEO BRONCHOSCOPY WITH ENDOBRONCHIAL ULTRASOUND;  Surgeon: Ottie Glazier, MD;  Location: [facility] ORS;  Service: Thoracic;  Laterality: N/A;  . VIDEO BRONCHOSCOPY WITH ENDOBRONCHIAL ULTRASOUND N/A 03/13/2019   Procedure: VIDEO BRONCHOSCOPY WITH ENDOBRONCHIAL ULTRASOUND;  Surgeon: Ottie Glazier, MD;  Location: [facility] ORS;  Service: Thoracic;  Laterality: N/A;    Medications Prior to Admission  Medication Sig Dispense Refill Last Dose  . albuterol (PROVENTIL HFA;VENTOLIN HFA) 108 (90 Base) MCG/ACT inhaler Inhale 2 puffs into the lungs every 4 (four) hours as needed for wheezing or shortness of breath.    prn at prn  . DALIRESP 250 MCG TABS Take 250 mcg by mouth at bedtime. 30 tablet 0 04/27/2019 at Unknown time  . ethambutol (MYAMBUTOL) 400 MG tablet Take 2 tablets (800 mg total) by mouth daily. 60 tablet 0 04/27/2019 at Unknown time  . finasteride (PROSCAR) 5 MG tablet Take 1 tablet (5 mg total) by mouth daily. 30 tablet 0 04/27/2019 at Unknown time  . FLUoxetine (PROZAC) 20 MG capsule Take 20 mg by mouth daily.    04/27/2019 at Unknown time  . Fluticasone-Umeclidin-Vilant (TRELEGY ELLIPTA) 100-62.5-25 MCG/INH AEPB Inhale 2 puffs into the lungs at bedtime.    04/27/2019 at Unknown time  . gabapentin (NEURONTIN) 300 MG capsule Take 900 mg by mouth at bedtime.    Past Week at Unknown time  . losartan (COZAAR) 25 MG tablet Take 25 mg by mouth daily.    04/27/2019 at Unknown time  . metoprolol succinate (TOPROL-XL) 25 MG 24 hr tablet Take 1 tablet (25 mg total) by mouth daily. 30 tablet 0 04/27/2019 at Unknown time  . mirtazapine (REMERON) 7.5 MG tablet Take 1 tablet (7.5 mg total) by mouth at bedtime. 30 tablet 0 04/27/2019 at Unknown time  . montelukast (SINGULAIR) 10 MG tablet Take 10 mg by mouth at bedtime.   04/27/2019 at Unknown time  . omeprazole  (PRILOSEC) 20 MG capsule Take 20 mg by mouth daily.   04/27/2019 at Unknown time  . ondansetron (ZOFRAN ODT) 4 MG disintegrating tablet Take 1 tablet (4 mg total) by mouth every 8 (eight) hours as needed for nausea or vomiting. 20 tablet 0 prn at prn  . Oxycodone HCl 10 MG TABS Take 10 mg by mouth 4 (four) times daily as needed for pain.   prn at prn  . predniSONE (DELTASONE) 20 MG tablet Take 2 tablets (40 mg total) by mouth daily for 5 days. 10 tablet 0 unknown at unknown  . rifampin (RIFADIN) 300 MG capsule Take 2 capsules (600 mg total) by mouth daily. 60 capsule 0 04/27/2019 at Unknown time  . rosuvastatin (CRESTOR) 5 MG tablet Take 5 mg by mouth at bedtime.   04/27/2019 at Unknown time  . tamsulosin (FLOMAX) 0.4 MG CAPS capsule Take 1 capsule (0.4 mg total) by mouth daily after supper. 90 capsule 1 04/27/2019 at Unknown time  . [EXPIRED] Morphine Sulfate (MORPHINE CONCENTRATE) 10 MG/0.5ML SOLN concentrated solution Take 0.5 mLs (10 mg total) by mouth every  8 (eight) hours as needed for up to 5 days for severe pain or shortness of breath. 180 mL 0 unknown at unknown   Social History   Socioeconomic History  . Marital status: Married    Spouse name: Not on file  . Number of children: Not on file  . Years of education: Not on file  . Highest education level: Not on file  Occupational History  . Not on file  Tobacco Use  . Smoking status: Light Tobacco Smoker    Packs/day: 0.50    Years: 30.00    Pack years: 15.00    Types: Cigarettes    Start date: 02/26/1973    Last attempt to quit: 09/21/2018    Years since quitting: 0.6  . Smokeless tobacco: Former Systems developer    Quit date: 02/26/1998  . Tobacco comment: smokes 2-3 cig a week 02/12/19  Substance and Sexual Activity  . Alcohol use: Yes    Alcohol/week: 20.0 standard drinks    Types: 20 Cans of beer per week    Comment: week   . Drug use: Yes    Comment: prescribed hydrocodone  . Sexual activity: Not on file  Other Topics Concern  . Not on  file  Social History Narrative   Patient is a Nature conservation officer.;  He lives with his wife at home.  Longstanding history of smoking; quit recently.  Denies alcohol abuse.   Social Determinants of Health   Financial Resource Strain:   . Difficulty of Paying Living Expenses: Not on file  Food Insecurity:   . Worried About Charity fundraiser in the Last Year: Not on file  . Ran Out of Food in the Last Year: Not on file  Transportation Needs:   . Lack of Transportation (Medical): Not on file  . Lack of Transportation (Non-Medical): Not on file  Physical Activity:   . Days of Exercise per Week: Not on file  . Minutes of Exercise per Session: Not on file  Stress:   . Feeling of Stress : Not on file  Social Connections:   . Frequency of Communication with Friends and Family: Not on file  . Frequency of Social Gatherings with Friends and Family: Not on file  . Attends Religious Services: Not on file  . Active Member of Clubs or Organizations: Not on file  . Attends Archivist Meetings: Not on file  . Marital Status: Not on file  Intimate Partner Violence:   . Fear of Current or Ex-Partner: Not on file  . Emotionally Abused: Not on file  . Physically Abused: Not on file  . Sexually Abused: Not on file    Family History  Problem Relation Age of Onset  . Cancer Mother   . COPD Father 62       black lung      Review of systems complete and found to be negative unless listed above      PHYSICAL EXAM  General: Well developed, well nourished, in no acute distress HEENT:  Normocephalic and atramatic Neck:  No JVD.  Lungs: Clear bilaterally to auscultation and percussion. Heart: HRRR . Normal S1 and S2 without gallops or murmurs.  Abdomen: Bowel sounds are positive, abdomen soft and non-tender  Msk:  Back normal, normal gait. Normal strength and tone for age. Extremities: No clubbing, cyanosis or edema.   Neuro: Alert and oriented X 3. Psych:  Good affect, responds  appropriately  Labs:   Lab Results  Component Value Date  WBC 12.4 (H) 04/28/2019   HGB 13.2 04/28/2019   HCT 40.1 04/28/2019   MCV 88.5 04/28/2019   PLT 243 04/28/2019    Recent Labs  Lab 04/27/19 1323 04/28/19 1037 04/29/19 1620 04/29/19 1620 04/30/19 0409  NA 136   < > 139  --   --   K 4.0   < > 3.8  --   --   CL 104   < > 100  --   --   CO2 25   < > 24  --   --   BUN 14   < > 16  --   --   CREATININE 0.90   < > 1.08   < > 1.12  CALCIUM 8.1*   < > 8.8*  --   --   PROT 7.0  --   --   --   --   BILITOT 0.8  --   --   --   --   ALKPHOS 72  --   --   --   --   ALT 12  --   --   --   --   AST 16  --   --   --   --   GLUCOSE 88   < > 92  --   --    < > = values in this interval not displayed.   Lab Results  Component Value Date   TROPONINI <0.03 06/15/2016    Lab Results  Component Value Date   CHOL 218 (H) 04/29/2019   Lab Results  Component Value Date   HDL 37 (L) 04/29/2019   Lab Results  Component Value Date   LDLCALC 161 (H) 04/29/2019   Lab Results  Component Value Date   TRIG 99 04/29/2019   Lab Results  Component Value Date   CHOLHDL 5.9 04/29/2019   No results found for: LDLDIRECT    Radiology: DG Chest 2 View  Result Date: 04/07/2019 CLINICAL DATA:  Chest pain and shortness of breath EXAM: CHEST - 2 VIEW COMPARISON:  March 13, 2019 chest radiograph and chest CT February 13, 2019 FINDINGS: There remains consolidation with volume loss in the right upper lobe with slightly improved aeration in this area compared to prior studies. Elsewhere, there is extensive underlying emphysematous change with fibrosis. Fibrosis is most severe in the right base region. No new opacity evident. Heart size and pulmonary vascularity are normal. No adenopathy is evident. There is aortic atherosclerosis. No bone lesions. There is calcification in each carotid artery. IMPRESSION: Underlying emphysematous change in fibrosis. Consolidation in the right upper lobe is  slightly less pronounced than on recent prior studies. No new opacity evident. Stable cardiac silhouette. No adenopathy demonstrable by radiography. Aortic Atherosclerosis (ICD10-I70.0). Electronically Signed   By: Lowella Grip III M.D.   On: 04/07/2019 11:20   CT Angio Chest PE W/Cm &/Or Wo Cm  Result Date: 04/28/2019 CLINICAL DATA:  Short of breath.  Tachycardia. EXAM: CT ANGIOGRAPHY CHEST WITH CONTRAST TECHNIQUE: Multidetector CT imaging of the chest was performed using the standard protocol during bolus administration of intravenous contrast. Multiplanar CT image reconstructions and MIPs were obtained to evaluate the vascular anatomy. CONTRAST:  75 mL Omnipaque COMPARISON:  Chest CT 04/16/2019 FINDINGS: Cardiovascular: No filling defects within the pulmonary arteries to suggest acute pulmonary embolism. Ascending aorta is aneurysmal at 42 mm (image 30/7). Aneurysm is fusiform. No axillary supraclavicular adenopathy. No mediastinal hilar adenopathy. No pericardial effusion. Mediastinum/Nodes: No axillary supraclavicular adenopathy.  No mediastinal adenopathy. Lungs/Pleura: Severe centrilobular emphysema. Superimposed consolidation in the RIGHT upper lobe not changed from prior. Small central cavitation is also similar (image 26/6). Pleural fluid at the RIGHT lung base and airspace disease in the RIGHT lower lobe is also similar. Within the LEFT lung, moderate pleural effusion. New airspace disease in the lower lobe and lingula. These findings in LEFT lung are worsened from comparison exam. Upper Abdomen: Limited view of the liver, kidneys, pancreas are unremarkable. Normal adrenal glands. Musculoskeletal: No aggressive osseous lesion. Review of the MIP images confirms the above findings. IMPRESSION: 1. No evidence acute pulmonary embolism. 2. Worsening airspace disease in the lingula and LEFT lower lobe suggesting pulmonary edema versus pneumonia. 3. Stable dense consolidation in the RIGHT upper lobe and  diffuse airspace disease in the RIGHT lower lobe suggesting multifocal pneumonia. 4. Bilateral pleural effusions unchanged. 5. Ascending thoracic aortic aneurysm. Recommend annual imaging followup by CTA or MRA. This recommendation follows 2010 ACCF/AHA/AATS/ACR/ASA/SCA/SCAI/SIR/STS/SVM Guidelines for the Diagnosis and Management of Patients with Thoracic Aortic Disease. Circulation. 2010; 121: D622-W979. Aortic aneurysm NOS (ICD10-I71.9) Electronically Signed   By: Suzy Bouchard M.D.   On: 04/28/2019 12:47   CT Angio Chest PE W and/or Wo Contrast  Result Date: 04/16/2019 CLINICAL DATA:  Shortness of breath and hypoxia EXAM: CT ANGIOGRAPHY CHEST WITH CONTRAST TECHNIQUE: Multidetector CT imaging of the chest was performed using the standard protocol during bolus administration of intravenous contrast. Multiplanar CT image reconstructions and MIPs were obtained to evaluate the vascular anatomy. CONTRAST:  58m OMNIPAQUE IOHEXOL 350 MG/ML SOLN COMPARISON:  04/07/2019. FINDINGS: Cardiovascular: Atheromatous calcifications, including the coronary arteries and aorta. Normally opacified pulmonary arteries with no pulmonary arterial filling defects seen. The ascending thoracic aorta measures 4.5 cm in maximum diameter, previously 4.6 cm. Aneurysmal dilatation of the descending thoracic aorta with a maximum diameter of 3.5 cm, previously 3.8 cm. Aneurysmal dilatation of the infrarenal abdominal aorta with a maximum diameter of 4.0 cm on the included portions. Mediastinum/Nodes: Mildly enlarged right hilar lymph nodes with little change. The largest measures 1.1 cm in short axis diameter on image number 55 series 4. Thyroid gland, trachea, and esophagus demonstrate no significant findings. Lungs/Pleura: Stable marked bilateral centrilobular bullous changes. Previously demonstrated right upper lobe consolidation with air bronchograms and focal distal bronchial dilatation without significant change. Small amount of  consolidation in the posterior left lower lobe with little change. Moderate-sized right pleural effusion and small to moderate-sized left pleural effusion, both increased. Upper Abdomen: Dilated in for abdominal aorta as described above, not included in its entirety. Musculoskeletal: Stable 20% inferior endplate compression deformity of a midthoracic vertebral body. Review of the MIP images confirms the above findings. IMPRESSION: 1. No pulmonary emboli. 2. Stable right upper lobe consolidation with air bronchograms and focal distal bronchial dilatation. 3. Stable small amount of consolidation in the posterior left lower lobe. 4. Stable mild right hilar adenopathy, most likely reactive. 5. Stable marked changes of COPD. 6. Stable aneurysmal dilatation of the ascending thoracic aorta and descending thoracic aorta with interval included aneurysmal dilatation of the infrarenal abdominal aorta, not included in its entirety, measuring 4.0 cm. 7. Moderate-sized right pleural effusion and small to moderate-sized left pleural effusion, both increased. 8. Calcific coronary artery and aortic atherosclerosis. Aortic Atherosclerosis (ICD10-I70.0) and Emphysema (ICD10-J43.9). Electronically Signed   By: SClaudie ReveringM.D.   On: 04/16/2019 16:28   CT Angio Chest PE W/Cm &/Or Wo Cm  Result Date: 04/07/2019 CLINICAL DATA:  Shortness of breath. EXAM:  CT ANGIOGRAPHY CHEST WITH CONTRAST TECHNIQUE: Multidetector CT imaging of the chest was performed using the standard protocol during bolus administration of intravenous contrast. Multiplanar CT image reconstructions and MIPs were obtained to evaluate the vascular anatomy. CONTRAST:  11m OMNIPAQUE IOHEXOL 350 MG/ML SOLN COMPARISON:  Chest radiograph April 07, 2019; chest CT February 13, 2019 FINDINGS: Cardiovascular: There is no demonstrable pulmonary embolus. Ascending thoracic aortic diameter measures 4.6 x 4.5 cm. No dissection is appreciable. The contrast bolus in the aorta is  somewhat less than optimal for dissection assessment. There is localized aneurysmal dilatation in the distal descending thoracic aorta at the diaphragmatic hiatus with localized plaque and calcification along the leftward aspect of this aneurysm. The descending thoracic aorta at the level of the diaphragmatic hiatus measures 3.8 x 3.1 cm. Appearance of this aneurysm is similar to most recent CT. There is aortic atherosclerosis as well as scattered foci of calcification in proximal visualized great vessels. There are foci of coronary artery calcification. There is left ventricular hypertrophy. No pericardial effusion or pericardial thickening. Mediastinum/Nodes: Thyroid appears unremarkable. There are mildly prominent right hilar lymph nodes, largest measuring 1.5 x 1.3 cm. Subcarinal lymph node measures 1.4 x 1.2 cm. No esophageal lesions are evident. Lungs/Pleura: There is a moderate right pleural effusion with a much smaller left pleural effusion. There is extensive underlying emphysematous change with fibrosis in the lower lung regions, significantly more on the right than on the left. There are multiple areas of cicatrization throughout the lungs bilaterally, more severe on the right than on the left. There is consolidation in the anterior segment right upper lobe with air bronchograms and volume loss. No similar consolidation elsewhere. Upper Abdomen: There is upper abdominal aortic atherosclerosis. Visualized upper abdominal structures otherwise appear unremarkable. Musculoskeletal: Anterior wedging of a midthoracic vertebral body is stable. No blastic or lytic bone lesions. No evident chest wall lesions. Review of the MIP images confirms the above findings. IMPRESSION: 1.  No demonstrable pulmonary embolus. 2. Prominence of the ascending thoracic aorta with a measured diameter of 4.6 x 4.5 cm. Ascending thoracic aortic aneurysm. Recommend semi-annual imaging followup by CTA or MRA and referral to  cardiothoracic surgery if not already obtained. This recommendation follows 2010 ACCF/AHA/AATS/ACR/ASA/SCA/SCAI/SIR/STS/SVM Guidelines for the Diagnosis and Management of Patients With Thoracic Aortic Disease. Circulation. 2010; 121:: [id number] Aortic aneurysm NOS (ICD10-I71.9). No dissection evident with contrast bolus somewhat less than optimal for dissection assessment. 3. Localized aneurysmal dilatation of the descending thoracic aorta at the diaphragmatic hiatus level measuring 3.8 x 3.1 cm. Localized plaque and dilatation along the leftward aspect of the aneurysm, also present previously. Note that there is aortic atherosclerosis as well as foci of great vessel and coronary artery calcification. 4. 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 Pleural effusions bilaterally, larger on the right than on the left. Persistent consolidation in the right upper lobe anteriorly with air bronchograms. Appearance most consistent with pneumonia with possibility of superimposed neoplasm not excluded anteriorly toward the apex on the right. 5.  Prominent right hilar and subcarinal lymph nodes. Aortic Atherosclerosis (ICD10-I70.0) and Emphysema (ICD10-J43.9). Aortic aneurysm NOS (ICD10-I71.9). Electronically Signed   By: WLowella GripIII M.D.   On: 04/07/2019 13:17   DG Chest Right Decubitus  Result Date: 04/21/2019 CLINICAL DATA:  Dyspnea, recent right-sided thoracentesis EXAM: CHEST - RIGHT DECUBITUS; CHEST - LEFT DECUBITUS COMPARISON:  CT 04/16/2019 FINDINGS: Bilateral decubitus views of the chest were obtained. Small to moderate right-sided pleural effusion  with small loculated component at the right upper lobe. Trace layering left-sided pleural effusion without loculated component. There is no pneumothorax. Chronic consolidation in the right upper lobe. Fibrotic changes throughout both lungs. Stable heart size. IMPRESSION: 1. Negative for pneumothorax. 2. Small to  moderate right-sided pleural effusion with small loculated component at the right upper lobe. 3. Trace layering left-sided pleural effusion without loculated component. Electronically Signed   By: Davina Poke D.O.   On: 04/21/2019 11:17   DG Chest Left Decubitus  Result Date: 04/21/2019 CLINICAL DATA:  Dyspnea, recent right-sided thoracentesis EXAM: CHEST - RIGHT DECUBITUS; CHEST - LEFT DECUBITUS COMPARISON:  CT 04/16/2019 FINDINGS: Bilateral decubitus views of the chest were obtained. Small to moderate right-sided pleural effusion with small loculated component at the right upper lobe. Trace layering left-sided pleural effusion without loculated component. There is no pneumothorax. Chronic consolidation in the right upper lobe. Fibrotic changes throughout both lungs. Stable heart size. IMPRESSION: 1. Negative for pneumothorax. 2. Small to moderate right-sided pleural effusion with small loculated component at the right upper lobe. 3. Trace layering left-sided pleural effusion without loculated component. Electronically Signed   By: Davina Poke D.O.   On: 04/21/2019 11:17   DG Chest Portable 1 View  Result Date: 04/28/2019 CLINICAL DATA:  Shortness of breath. EXAM: PORTABLE CHEST 1 VIEW COMPARISON:  04/27/2019 FINDINGS: The cardiac silhouette, mediastinal and hilar contours are within normal limits and stable. Stable tortuosity of the thoracic aorta. Stable underlying chronic lung disease with emphysema and pulmonary scarring. Persistent superimposed bilateral infiltrates with persistent right upper lobe airspace consolidation and air bronchograms. Small right pleural effusion. No pneumothorax. IMPRESSION: Stable appearing underlying chronic lung disease/emphysema and pulmonary scarring with superimposed infiltrates and small right effusion. Electronically Signed   By: Marijo Sanes M.D.   On: 04/28/2019 10:56   DG Chest Portable 1 View  Result Date: 04/27/2019 CLINICAL DATA:  Shortness of breath  EXAM: PORTABLE CHEST 1 VIEW COMPARISON:  04/22/2019 FINDINGS: Increased density at the right lung base. Stable right pleural effusion. Stable fibrotic changes. Cardiomediastinal contours and volume loss in the right chest are unchanged. IMPRESSION: Increased patchy atelectasis/consolidation at the right lung base. Otherwise stable appearance. Electronically Signed   By: Macy Mis M.D.   On: 04/27/2019 14:40   DG Chest Port 1 View  Result Date: 04/22/2019 CLINICAL DATA:  Right-sided pleural effusion EXAM: PORTABLE CHEST 1 VIEW COMPARISON:  04/17/2019 FINDINGS: Stable cardiomediastinal contours. Chronic consolidation within the right upper lobe. Fibrotic changes throughout both lungs. No significant pleural fluid collection is seen on frontal view. No pneumothorax IMPRESSION: No pneumothorax status post thoracentesis. Electronically Signed   By: Davina Poke D.O.   On: 04/22/2019 13:06   DG Chest Port 1 View  Result Date: 04/17/2019 CLINICAL DATA:  Right-sided thoracentesis EXAM: PORTABLE CHEST 1 VIEW COMPARISON:  Yesterday FINDINGS: Interstitial and airspace opacity in the right upper lobe with volume loss. Normal heart size and stable mediastinal contours. No visible pneumothorax. Trace left pleural effusion. IMPRESSION: 1. No complicating features after thoracentesis. 2. Emphysema and stable right upper lobe opacity with volume loss. Electronically Signed   By: Monte Fantasia M.D.   On: 04/17/2019 11:17   DG Chest Portable 1 View  Result Date: 04/16/2019 CLINICAL DATA:  Shortness of breath EXAM: PORTABLE CHEST 1 VIEW COMPARISON:  April 08, 2019 chest radiograph and chest CT Jul 05 2019 FINDINGS: Extensive fibrosis is noted throughout the lungs bilaterally. There may be a degree of superimposed interstitial edema given  increase in interstitial thickening compared to recent CT examination. There is equivocal increase in interstitial thickening bilaterally compared to most recent chest  radiograph. There may be patchy airspace opacity intermingled with interstitial prominence. There remains extensive consolidation in the right upper lobe. Heart size and pulmonary vascularity are stable and within normal limits given the underlying fibrosis. No adenopathy is appreciable by radiography. No bone lesions. IMPRESSION: Persistent extensive fibrosis with suspected superimposed interstitial edema and possible interspersed airspace opacity. The degree of ill-defined opacity, primarily in the left lower lobe is modestly increased compared to most recent chest radiograph and is definitely increased compared to most recent CT. Consolidation remains in the right upper lobe. Stable cardiac silhouette. Electronically Signed   By: Lowella Grip III M.D.   On: 04/16/2019 14:43   DG Chest Port 1 View  Result Date: 04/08/2019 CLINICAL DATA:  Post right-sided thoracentesis. EXAM: PORTABLE CHEST 1 VIEW COMPARISON:  04/07/2019; chest CT-04/07/2019 FINDINGS: Grossly unchanged cardiac silhouette and mediastinal contours with persistent obscuration of the right heart border and mediastinal stripe secondary to worsening right upper lobe heterogeneous opacities and associated volume loss. Worsening right mid and bilateral lower lung interstitial thickening. Interval reduction/resolution of right-sided pleural effusion post thoracentesis. No pneumothorax. No acute osseous abnormalities. IMPRESSION: 1. Interval reduction/resolution of right-sided pleural effusion post thoracentesis. No pneumothorax. 2. Worsening right heterogeneous/consolidative opacities and associated volume loss. 3. Worsening right mid bilateral lower lung interstitial thickening with broad differential considerations including pulmonary edema versus progression of multifocal infection, including atypical etiologies. Electronically Signed   By: Sandi Mariscal M.D.   On: 04/08/2019 17:31   ECHOCARDIOGRAM COMPLETE  Result Date: 04/08/2019     ECHOCARDIOGRAM REPORT   Patient Name:   Joseph Peters Date of Exam: 04/07/2019 Medical Rec #:  [id number]        Height:       65.0 in Accession #:    [id number]       Weight:       120.6 lb Date of Birth:  March 31, 1955        BSA:          1.60 m Patient Age:    70 years         BP:           121/92 mmHg Patient Gender: M                HR:           96 bpm. Exam Location:  [facility] Procedure: 2D Echo, Cardiac Doppler and Color Doppler Indications:     Elevated Troponin  History:         Patient has no prior history of Echocardiogram examinations.                  Risk Factors:Dyslipidemia and Hypertension. Stroke. Pneumonia.                  Myocardial Infarction. Dyspnea. Coronary artery disease. COPD.                  Chest pain. AAA.  Sonographer:     Wilford Sports Rodgers-Jones Referring Phys:  [place] Diagnosing Phys: Isaias Cowman MD IMPRESSIONS  1. Left ventricular ejection fraction, by estimation, is 35 to 40%. The left ventricle has moderately decreased function. The left ventrical has no regional wall motion abnormalities. The left ventricular internal cavity size was mildly to moderately dilated. Left ventricular diastolic parameters are indeterminate.  2. Right ventricular  systolic function is normal. The right ventricular size is normal.  3. No left atrial/left atrial appendage thrombus was detected.  4. The mitral valve is normal in structure and function. Mild mitral valve regurgitation. No evidence of mitral stenosis.  5. The aortic valve is normal in structure and function. Aortic valve regurgitation is not visualized. Mild to moderate aortic valve stenosis.  6. The inferior vena cava is normal in size with greater than 50% respiratory variability, suggesting right atrial pressure of 3 mmHg. FINDINGS  Left Ventricle: Left ventricular ejection fraction, by estimation, is 35 to 40%. The left ventricle has moderately decreased function. The left ventricle has no regional wall motion  abnormalities. The left ventricular internal cavity size was mildly to moderately dilated. There is no. There is no left ventricular hypertrophy. Left ventricular diastolic parameters are indeterminate.  LV Wall Scoring: The basal inferolateral segment, mid inferoseptal segment, apical septal segment, basal inferior segment, and apex are hypokinetic. Right Ventricle: The right ventricular size is normal. No increase in right ventricular wall thickness. Right ventricular systolic function is normal. Left Atrium: Left atrial size was normal in size. Right Atrium: Right atrial size was normal in size. Pericardium: There is no evidence of pericardial effusion. Mitral Valve: The mitral valve is normal in structure and function. Normal mobility of the mitral valve leaflets. Mild mitral valve regurgitation. No evidence of mitral valve stenosis. Tricuspid Valve: The tricuspid valve is normal in structure. Tricuspid valve regurgitation is mild . No evidence of tricuspid stenosis. Aortic Valve: The aortic valve is normal in structure and function. Aortic valve regurgitation is not visualized. Mild to moderate aortic stenosis is present. Aortic valve mean gradient measures 6.7 mmHg. Aortic valve peak gradient measures 9.2 mmHg. Aortic valve area, by VTI measures 0.96 cm. Pulmonic Valve: The pulmonic valve was normal in structure. Pulmonic valve regurgitation is not visualized. No evidence of pulmonic stenosis. Aorta: The aortic root is normal in size and structure. Venous: The inferior vena cava is normal in size with greater than 50% respiratory variability, suggesting right atrial pressure of 3 mmHg. The inferior vena cava and the hepatic vein show a normal flow pattern. IAS/Shunts: No atrial level shunt detected by color flow Doppler.  LEFT VENTRICLE PLAX 2D LVIDd:         5.61 cm  Diastology LVIDs:         4.21 cm  LV e' lateral:   4.35 cm/s LV PW:         0.94 cm  LV E/e' lateral: 13.5 LV IVS:        0.68 cm  LV e'  medial:    5.00 cm/s LVOT diam:     2.20 cm  LV E/e' medial:  11.7 LV SV:         23.64 ml LV SV Index:   47.63 LVOT Area:     3.80 cm  RIGHT VENTRICLE RV Basal diam:  3.68 cm RV S prime:     10.40 cm/s TAPSE (M-mode): 1.8 cm LEFT ATRIUM           Index       RIGHT ATRIUM           Index LA diam:      4.20 cm 2.63 cm/m  RA Area:     13.50 cm LA Vol (A2C): 40.0 ml 25.07 ml/m RA Volume:   39.70 ml  24.88 ml/m LA Vol (A4C): 17.9 ml 11.22 ml/m  AORTIC VALVE AV Area (Vmax):  0.94 cm AV Area (Vmean):   0.87 cm AV Area (VTI):     0.96 cm AV Vmax:           152.00 cm/s AV Vmean:          122.333 cm/s AV VTI:            0.246 m AV Peak Grad:      9.2 mmHg AV Mean Grad:      6.7 mmHg LVOT Vmax:         37.45 cm/s LVOT Vmean:        27.900 cm/s LVOT VTI:          0.062 m LVOT/AV VTI ratio: 0.25  AORTA Ao Root diam: 3.60 cm MITRAL VALVE MV Area (PHT): 7.66 cm             SHUNTS MV Decel Time: 99 msec              Systemic VTI:  0.06 m MV E velocity: 58.70 cm/s 103 cm/s  Systemic Diam: 2.20 cm MV A velocity: 52.20 cm/s 70.3 cm/s MV E/A ratio:  1.12       1.5 Isaias Cowman MD Electronically signed by Isaias Cowman MD Signature Date/Time: 04/08/2019/1:00:24 PM    Final    Pulmonary Function Test [facility] Only  Result Date: 04/23/2019 I DO NOT SEE PFT's for this encounter  US THORACENTESIS ASP PLEURAL SPACE W/IMG GUIDE  Result Date: 04/22/2019 INDICATION: 64 year old male with recurrent right-sided pleural effusion. EXAM: ULTRASOUND GUIDED RIGHT THORACENTESIS MEDICATIONS: None. COMPLICATIONS: None immediate. PROCEDURE: An ultrasound guided thoracentesis was thoroughly discussed with the patient and questions answered. The benefits, risks, alternatives and complications were also discussed. The patient understands and wishes to proceed with the procedure. Written consent was obtained. Ultrasound was performed to localize and mark an adequate pocket of fluid in the right chest. The area was then prepped and  draped in the normal sterile fashion. 1% Lidocaine was used for local anesthesia. Under ultrasound guidance a 6 Fr Safe-T-Centesis catheter was introduced. Thoracentesis was performed. The catheter was removed and a dressing applied. FINDINGS: A total of approximately 450 mL of yellow pleural fluid was removed. Samples were sent to the laboratory if requested by the clinical team. IMPRESSION: Successful ultrasound guided right thoracentesis yielding 450 mL of pleural fluid. Electronically Signed   By: Jacqulynn Cadet M.D.   On: 04/22/2019 17:08   US THORACENTESIS ASP PLEURAL SPACE W/IMG GUIDE  Result Date: 04/17/2019 INDICATION: Patient with history of COPD, shortness of breath, right-sided pleural effusion. Request is made for diagnostic and therapeutic right thoracentesis. EXAM: ULTRASOUND GUIDED RIGHT THORACENTESIS MEDICATIONS: 10 mL 2% nesacaine Patient with documented allergy to lidocaine, recent tolerance of nesacaine injection 4/40/34 COMPLICATIONS: None immediate. PROCEDURE: An ultrasound guided thoracentesis was thoroughly discussed with the patient and questions answered. The benefits, risks, alternatives and complications were also discussed. The patient understands and wishes to proceed with the procedure. Written consent was obtained. Ultrasound was performed to localize and mark an adequate pocket of fluid in the right chest. The area was then prepped and draped in the normal sterile fashion. 1% Lidocaine was used for local anesthesia. Under ultrasound guidance a 6 Fr Safe-T-Centesis catheter was introduced. Thoracentesis was performed. The catheter was removed and a dressing applied. FINDINGS: A total of approximately 700 mL of yellow, clear fluid was removed. Samples were sent to the laboratory as requested by the clinical team. IMPRESSION: Successful ultrasound guided right thoracentesis yielding 700 mL of  pleural fluid. Read by: Brynda Greathouse PA-C Electronically Signed   By: Aletta Edouard  M.D.   On: 04/17/2019 11:26   US THORACENTESIS ASP PLEURAL SPACE W/IMG GUIDE  Addendum Date: 04/08/2019   ADDENDUM REPORT: 04/08/2019 17:38 ADDENDUM: Note, Nesacaine was utilized for subcutaneous anesthesia purposes given history of lidocaine allergy. Patient tolerated this medication without incident. Electronically Signed   By: Sandi Mariscal M.D.   On: 04/08/2019 17:38   Result Date: 04/08/2019 INDICATION: Symptomatic right-sided sided pleural effusion EXAM: US THORACENTESIS ASP PLEURAL SPACE W/IMG GUIDE COMPARISON:  Chest radiograph-04/07/2019; chest CT-04/06/2018 MEDICATIONS: None. COMPLICATIONS: None immediate. TECHNIQUE: Informed written consent was obtained from the patient after a discussion of the risks, benefits and alternatives to treatment. A timeout was performed prior to the initiation of the procedure. Initial ultrasound scanning demonstrates a small anechoic right-sided pleural effusion. The lower chest was prepped and draped in the usual sterile fashion. 1% lidocaine was used for local anesthesia. An ultrasound image was saved for documentation purposes. An 8 Fr Safe-T-Centesis catheter was introduced under ultrasound guidance. The thoracentesis was performed. The catheter was removed and a dressing was applied. The patient tolerated the procedure well without immediate post procedural complication. The patient was escorted to have an upright chest radiograph. FINDINGS: A total of approximately 450 cc of serous fluid was removed. Requested samples were sent to the laboratory. IMPRESSION: Successful ultrasound-guided right sided thoracentesis yielding 450 cc of pleural fluid. Electronically Signed: By: Sandi Mariscal M.D. On: 04/08/2019 17:33    EKG: Normal sinus rhythm nonspecific T wave changes with frequent PACs no evidence of ischemia not a STEMI  ASSESSMENT AND PLAN:  Acute on chronic respiratory failure Hypoxemia COPD Essential hypertension Hyperlipidemia GERD Congestive heart  failure History of MAC infection Possible hospital-acquired pneumonia Sepsis Elevated troponins borderline Noncompliant . Plan Agree with admit to ICU for aggressive critical care management Continue inhalers supplemental oxygen respiratory support and steroids Agree with broad-spectrum antibiotic therapy for sepsis and possible pneumonia COPD continue aggressive management Hypoxemia requiring supplemental oxygen therapy GERD recommend Protonix therapy BiPAP therapy initially for respiratory failure Elevated BNP suggestive of heart failure Known cardiomyopathy ejection fraction around 35 to 40% continue current therapy including IV diuretics Hyperlipidemia recommend maintain Crestor therapy for lipid management Due for echocardiogram because of recent imaging April 07, 2019 showed EF 35 to 40% Recommend short term anticoagulation mostly for DVT prophylaxis ischemia is less likely Recommend conservative cardiac involvement at this point with Patient will need outpatient follow-up for heart failure cardiomyopathy Will consider initiation of heart failure therapy as has been done before including losartan metoprolol diuretics may consider switching to Entresto  Signed: Yolonda Kida MD, PHD, [facility] 04/30/2019, 8:07 AM

## 2019-04-30 NOTE — Plan of Care (Signed)
Pt weaned down to 2L [place] (pt states he wears 3L at home) and is maintaining SaO2 94-100%.  Had some mild ETOH w/d sx early in the shift, mitigated well with 1mg  IV ativan.  Pt has remained in CCU d/t beats of v-tach he had during day shift.  No such arrhythmias observed throughout this shift, NSR on the monitor.

## 2019-04-30 NOTE — Progress Notes (Signed)
Patient's blood pressure has trended down to 30-85 systolic.  Patient remains alert, oriented, and making adequate urine.  Only complaint is pain in neck which has been treated with PRN pain medication. Dr. Kurtis Bushman called and notified.  Adjustments being made to antihypertensive medications.

## 2019-05-01 LAB — BASIC METABOLIC PANEL
Anion gap: 14 (ref 5–15)
BUN: 16 mg/dL (ref 8–23)
CO2: 25 mmol/L (ref 22–32)
Calcium: 8.6 mg/dL — ABNORMAL LOW (ref 8.9–10.3)
Chloride: 97 mmol/L — ABNORMAL LOW (ref 98–111)
Creatinine, Ser: 1.17 mg/dL (ref 0.61–1.24)
GFR calc Af Amer: >60 mL/min (ref >60)
GFR calc non Af Amer: >60 mL/min (ref >60)
Glucose, Bld: 91 mg/dL (ref 70–99)
Potassium: 3.1 mmol/L — ABNORMAL LOW (ref 3.5–5.1)
Sodium: 136 mmol/L (ref 135–145)

## 2019-05-01 LAB — CBC
HCT: 41.7 % (ref 39.0–52.0)
Hemoglobin: 13.6 g/dL (ref 13.0–17.0)
MCH: 29.1 pg (ref 26.0–34.0)
MCHC: 32.6 g/dL (ref 30.0–36.0)
MCV: 89.3 fL (ref 80.0–100.0)
Platelets: 307 10*3/uL (ref 150–400)
RBC: 4.67 MIL/uL (ref 4.22–5.81)
RDW: 14.2 % (ref 11.5–15.5)
WBC: 10.7 10*3/uL — ABNORMAL HIGH (ref 4.0–10.5)
nRBC: 0 % (ref 0.0–0.2)

## 2019-05-01 LAB — MAGNESIUM: Magnesium: 1.8 mg/dL (ref 1.7–2.4)

## 2019-05-01 MED ORDER — SODIUM CHLORIDE 0.9% FLUSH
10.0000 mL | Freq: Two times a day (BID) | INTRAVENOUS | Status: DC
  Administered 2019-05-01 – 2019-05-03 (×4): 10 mL via INTRAVENOUS

## 2019-05-01 MED ORDER — POTASSIUM CHLORIDE CRYS ER 20 MEQ PO TBCR
40.0000 meq | EXTENDED_RELEASE_TABLET | Freq: Once | ORAL | Status: AC
  Administered 2019-05-02: 40 meq via ORAL
  Filled 2019-05-01: qty 2

## 2019-05-01 MED ORDER — SODIUM CHLORIDE 0.9 % IV SOLN
INTRAVENOUS | Status: DC | PRN
  Administered 2019-05-01 – 2019-05-03 (×4): 250 mL via INTRAVENOUS

## 2019-05-01 MED ORDER — ENSURE ENLIVE PO LIQD
237.0000 mL | Freq: Three times a day (TID) | ORAL | Status: DC
  Administered 2019-05-01 – 2019-05-03 (×6): 237 mL via ORAL

## 2019-05-01 NOTE — Plan of Care (Signed)
  Problem: Health Behavior/Discharge Planning: Goal: Ability to manage health-related needs will improve Outcome: Progressing   Problem: Clinical Measurements: Goal: Will remain free from infection Outcome: Progressing Goal: Diagnostic test results will improve Outcome: Progressing Goal: Respiratory complications will improve Outcome: Progressing Goal: Cardiovascular complication will be avoided Outcome: Progressing   Problem: Activity: Goal: Risk for activity intolerance will decrease Outcome: Progressing   Problem: Nutrition: Goal: Adequate nutrition will be maintained Outcome: Progressing   Problem: Coping: Goal: Level of anxiety will decrease Outcome: Progressing   Problem: Elimination: Goal: Will not experience complications related to bowel motility Outcome: Progressing Goal: Will not experience complications related to urinary retention Outcome: Progressing   Problem: Pain Managment: Goal: General experience of comfort will improve Outcome: Progressing   Problem: Safety: Goal: Ability to remain free from injury will improve Outcome: Progressing   Problem: Skin Integrity: Goal: Risk for impaired skin integrity will decrease Outcome: Progressing

## 2019-05-01 NOTE — Consult Note (Signed)
Pharmacy Antibiotic Note  Joseph Peters is a 64 y.o. male admitted on 04/28/2019 with shortness of breath. Patient recently admitted with COPD exacerbation and PMH of MAC. Pharmacy has been consulted for cefepime dosing. This is day # 3 of antibiotics; renal function has slowly drifted lower, WBC remains slightly elevated with no recent febrile episodes and procalcitonin is negative  Plan:  Cefepime: continue 2g IV every 12 hours.   Height: _0  (165.1 cm) Weight: 111 lb 1.8 oz (50.4 kg) IBW/kg (Calculated) : 61.5  Temp (24hrs), Avg:97.8 F (36.6 C), Min:97.6 F (36.4 C), Max:98.3 F (36.8 C)  Recent Labs  Lab 04/27/19 1323 04/28/19 1037 04/28/19 1302 04/29/19 1620 04/30/19 0409 05/01/19 0459  WBC 8.0 12.4*  --   --   --  10.7*  CREATININE 0.90 1.02  --  1.08 1.12 1.17  LATICACIDVEN  --  2.2* 1.8  --   --   --     Estimated Creatinine Clearance: 46.7 mL/min (by C-G formula based on SCr of 1.17 mg/dL).    Allergies  Allergen Reactions  . Penicillins Swelling, Rash and Other (See Comments)    Swelling in throat  Did it involve swelling of the face/tongue/throat, SOB, or low BP? yes Did it involve sudden or severe rash/hives, skin peeling, or any reaction on the inside of your mouth or nose? no Did you need to seek medical attention at a hospital or doctor's office? Yes When did it last happen?years  If all above answers are "NO", may proceed with cephalosporin use.    . Lidocaine Other (See Comments)    Unsure  At dentist's office    Antimicrobials this admission: 3/2 azithro/ceftriaxone x 1 3/2 vancomycin >> 3/5 3/2 cefepime >>   Microbiology results: 3/2 SARS Coronavirus 2: Negative 3/2 Influenza A/B: negative   3/2 Bcx: no growth x 3 days 3/3 MRSA PCR: negative  Thank you for allowing pharmacy to be a part of this patient's care.  Dallie Piles 05/01/2019 8:53 AM

## 2019-05-01 NOTE — Progress Notes (Signed)
[facility]    SUBJECTIVE:  Patient still has a respiratory type symptoms improving cell as cough shortness of breath dyspnea no significant hemoptysis or hematemesis denies any significant chest pain feels better than he did yesterday when he was admitted   Vitals:   05/01/19 1000 05/01/19 1100 05/01/19 1200 05/01/19 1300  BP: (!) 121/97 (!) 115/93 131/88 (!) 135/98  Pulse: 80 83 80 81  Resp: 14 (!) 25 (!) 25 (!) 21  Temp:   97.6 F (36.4 C)   TempSrc:   Axillary   SpO2: 94% 100% 98% 97%  Weight:      Height:         Intake/Output Summary (Last 24 hours) at 05/01/2019 1442 Last data filed at 05/01/2019 1327 Gross per 24 hour  Intake 1321.93 ml  Output 1700 ml  Net -378.07 ml      PHYSICAL EXAM  General: Well developed, well nourished, in no acute distress HEENT:  Normocephalic and atramatic Neck:  No JVD.  Lungs: Clear bilaterally to auscultation and percussion. Heart: HRRR . Normal S1 and S2 without gallops or murmurs.  Abdomen: Bowel sounds are positive, abdomen soft and non-tender  Msk:  Back normal, normal gait. Normal strength and tone for age. Extremities: No clubbing, cyanosis or edema.   Neuro: Alert and oriented X 3. Psych:  Good affect, responds appropriately   LABS: Basic Metabolic Panel: Recent Labs    04/29/19 1620 04/29/19 1620 04/30/19 0409 05/01/19 0459  NA 139  --   --  136  K 3.8  --   --  3.1*  CL 100  --   --  97*  CO2 24  --   --  25  GLUCOSE 92  --   --  91  BUN 16  --   --  16  CREATININE 1.08   < > 1.12 1.17  CALCIUM 8.8*  --   --  8.6*  MG 2.2  --   --  1.8   < > = values in this interval not displayed.   Liver Function Tests: No results for input(s): AST, ALT, ALKPHOS, BILITOT, PROT, ALBUMIN in the last 72 hours. No results for input(s): LIPASE, AMYLASE in the last 72 hours. CBC: Recent Labs    05/01/19 0459  WBC 10.7*  HGB 13.6  HCT 41.7  MCV 89.3  PLT 307   Cardiac Enzymes: No results for input(s): CKTOTAL, CKMB,  CKMBINDEX, TROPONINI in the last 72 hours. BNP: Invalid input(s): POCBNP D-Dimer: No results for input(s): DDIMER in the last 72 hours. Hemoglobin A1C: Recent Labs    04/29/19 0512  HGBA1C 5.6   Fasting Lipid Panel: Recent Labs    04/29/19 0512  CHOL 218*  HDL 37*  LDLCALC 161*  TRIG 99  CHOLHDL 5.9   Thyroid Function Tests: No results for input(s): TSH, T4TOTAL, T3FREE, THYROIDAB in the last 72 hours.  Invalid input(s): FREET3 Anemia Panel: No results for input(s): VITAMINB12, FOLATE, FERRITIN, TIBC, IRON, RETICCTPCT in the last 72 hours.  No results found.   Echo  Patient fraction between 35 and 40%  TELEMETRY:  Normal sinus rhythm nonspecific ST T-wave changes  ASSESSMENT AND PLAN:  Principal Problem:   Acute on chronic respiratory failure with hypoxia (HCC) Active Problems:   COPD exacerbation (HCC)   Essential hypertension   Hyperlipidemia   Gastroesophageal reflux disease without esophagitis   History of MAC infection   Acute on chronic systolic CHF (congestive heart failure) (HCC)   Elevated  troponin   HCAP (healthcare-associated pneumonia)   Sepsis ([place])   CAD (coronary artery disease)   BPH (benign prostatic hyperplasia)   Depression    1.  Pneumonia  palpitations  coronary artery disease  smoking  Chronic obstructive pulmonary disease  borderline troponins  hypertension  hyperlipidemia .  plan  continue ICU level care  continue broad-spectrum antibiotic therapy  recommend supplemental oxygen inhalers as necessary  agree with statin therapy for hyperlipidemia  maintain adequate blood pressure control  advised patient to quit smoking  continue supportive care for Chronic obstructive pulmonary disease including inhalers possibly steroids antibiotics  have the patient follow up with Cardiology upon discharge 1-2 weeks   Yolonda Kida, MD 05/01/2019 2:42 PM

## 2019-05-01 NOTE — Progress Notes (Addendum)
Shift summary:  - Chief complaint this AM is difficulty starting urine stream.  - NAD. On baseline oxygen dose.  - 1017 hrs: MD messaged via secure chat regarding the potential for an in-patient palliative care consult as patient is established with Palliative Care at home, and also for a potential transfer out of SDU; awaiting response.  - 1343 hrs: MD again messaged via secure chat regarding the same issues as above; awaiting response.  - 1411 hrs: New orders written by MD to transfer out of SDU to Telemetry.   - 1715 hrs: Report given to 2A RN, Caryl Pina.

## 2019-05-01 NOTE — Progress Notes (Signed)
PROGRESS NOTE    Joseph Peters  KCM:[id number] DOB: 08-Jul-1955 DOA: 04/28/2019 PCP: Barbaraann Boys, MD    Brief Narrative:  Joseph Peters is a 64 y.o. male with medical history significant of hypertension, hyperlipidemia, COPD on 3 L nasal cannula oxygen at home, asthma, stroke, GERD, depression, anxiety, RLS, CAD, CHF with EF 35-40%, IBS, alcohol abuse, tobacco abuse, AAA, BPH, MAC infection, who presents with shortness of breath.pt was found to have WBC 12.4, BNP> 4500, troponin 77, 75, lactic acid 2.2, 1.8, negative COVID-19 PCR, electrolytes renal function okay, temperature normal, blood pressure 161/110, tachycardia, tachypnea, oxygen desaturation to 85% on 3 L home level of oxygen, requiring BiPAP.  Patient is admitted to stepdown as an inpatient.     Consultants:   None  Procedures:  CTA of chest:  1. No evidence acute pulmonary embolism. 2. Worsening airspace disease in the lingula and LEFT lower lobe suggesting pulmonary edema versus pneumonia. 3. Stable dense consolidation in the RIGHT upper lobe and diffuse airspace disease in the RIGHT lower lobe suggesting multifocal pneumonia. 4. Bilateral pleural effusions unchanged. 5. Ascending thoracic aortic aneurysm.  Antimicrobials:   Vanco and cefepime   Subjective: C/o sob overnight. No cp.   Objective: Vitals:   05/01/19 1500 05/01/19 1600 05/01/19 1700 05/01/19 1753  BP: 111/87 127/89 123/85 106/79  Pulse: 81 73 82 91  Resp: 18 (!) _0 Temp:  98.7 F (37.1 C)    TempSrc:  Axillary    SpO2: 97% 97% 97% 95%  Weight:      Height:        Intake/Output Summary (Last 24 hours) at 05/01/2019 1902 Last data filed at 05/01/2019 1700 Gross per 24 hour  Intake 1411.42 ml  Output 1250 ml  Net 161.42 ml   Filed Weights   04/28/19 1024 04/28/19 1855  Weight: 49.9 kg 50.4 kg    Examination:  General exam: Appears calm and comfortable , nad Respiratory system: Decreased breath sounds at bases, scattered  crackles no wheezing  Cardiovascular system: S1 & S2 heard, RRR.+ JVD,  No murmurs, rubs, gallops or clicks.  Gastrointestinal system: Abdomen is nondistended, soft and nontender. Normal bowel sounds heard. Central nervous system: Alert and oriented. No focal neurological deficits. Extremities: No edema Skin: Warm dry Psychiatry: Judgement and insight appear normal. Mood & affect appropriate.     Data Reviewed: I have personally reviewed following labs and imaging studies  CBC: Recent Labs  Lab 04/27/19 1323 04/28/19 1037 05/01/19 0459  WBC 8.0 12.4* 10.7*  NEUTROABS 5.5  --   --   HGB 13.0 13.2 13.6  HCT 40.2 40.1 41.7  MCV 89.1 88.5 89.3  PLT 234 243 056   Basic Metabolic Panel: Recent Labs  Lab 04/27/19 1323 04/28/19 1037 04/29/19 0512 04/29/19 1620 04/30/19 0409 05/01/19 0459  NA 136 135  --  139  --  136  K 4.0 3.7  --  3.8  --  3.1*  CL 104 103  --  100  --  97*  CO2 25 21*  --  24  --  25  GLUCOSE 88 120*  --  92  --  91  BUN 14 16  --  16  --  16  CREATININE 0.90 1.02  --  1.08 1.12 1.17  CALCIUM 8.1* 8.4*  --  8.8*  --  8.6*  MG  --   --  1.9 2.2  --  1.8   GFR: Estimated Creatinine Clearance: 46.7  mL/min (by C-G formula based on SCr of 1.17 mg/dL). Liver Function Tests: Recent Labs  Lab 04/27/19 1323  AST 16  ALT 12  ALKPHOS 72  BILITOT 0.8  PROT 7.0  ALBUMIN 3.0*   No results for input(s): LIPASE, AMYLASE in the last 168 hours. No results for input(s): AMMONIA in the last 168 hours. Coagulation Profile: No results for input(s): INR, PROTIME in the last 168 hours. Cardiac Enzymes: No results for input(s): CKTOTAL, CKMB, CKMBINDEX, TROPONINI in the last 168 hours. BNP (last 3 results) No results for input(s): PROBNP in the last 8760 hours. HbA1C: Recent Labs    04/29/19 0512  HGBA1C 5.6   CBG: Recent Labs  Lab 04/28/19 1855  GLUCAP 116*   Lipid Profile: Recent Labs    04/29/19 0512  CHOL 218*  HDL 37*  LDLCALC 161*  TRIG 99    CHOLHDL 5.9   Thyroid Function Tests: No results for input(s): TSH, T4TOTAL, FREET4, T3FREE, THYROIDAB in the last 72 hours. Anemia Panel: No results for input(s): VITAMINB12, FOLATE, FERRITIN, TIBC, IRON, RETICCTPCT in the last 72 hours. Sepsis Labs: Recent Labs  Lab 04/28/19 1037 04/28/19 1137 04/28/19 1302  PROCALCITON  --  <0.10  --   LATICACIDVEN 2.2*  --  1.8    Recent Results (from the past 240 hour(s))  Body fluid culture     Status: None   Collection Time: 04/22/19 12:13 PM   Specimen: PATH Cytology Pleural fluid  Result Value Ref Range Status   Specimen Description   Final    PLEURAL Performed at [facility], [address], [place], [place] [address]    Special Requests NONE  Final   Gram Stain   Final    FEW WBC PRESENT, PREDOMINANTLY MONONUCLEAR NO ORGANISMS SEEN    Culture   Final    NO GROWTH 3 DAYS Performed at [facility], [place] [address] [place], [place] [address]    Report Status 04/26/2019 FINAL  Final  Fungus Culture With Stain     Status: None (Preliminary result)   Collection Time: 04/22/19 12:13 PM   Specimen: PATH Cytology Pleural fluid  Result Value Ref Range Status   Fungus Stain Final report  Final    Comment: (NOTE) Performed At: [facility] [address] [place], [place] [id number] Rush Farmer MD [id number]    Fungus (Mycology) Culture PENDING  Incomplete   Fungal Source PLEURAL  Final    Comment: Performed at [facility], [address] [place], [place] [address]  Acid Fast Smear (AFB)     Status: None   Collection Time: 04/22/19 12:13 PM   Specimen: PATH Cytology Pleural fluid  Result Value Ref Range Status   AFB Specimen Processing Concentration  Final   Acid Fast Smear Negative  Final    Comment: (NOTE) Performed At: [facility] [address] [place], [place] [id number] Rush Farmer MD ZR:[id number]    Source (AFB) PLEURAL  Final    Comment: Performed at [facility], [address] [place], [place] [address]  Fungus Culture Result     Status: None   Collection Time: 04/22/19 12:13 PM  Result Value Ref Range Status   Result 1 Comment  Final    Comment: (NOTE) KOH/Calcofluor preparation:  no fungus observed. Performed At: [facility] [place], [place] [id number] Rush Farmer MD KA:[id number]   Blood culture (routine x 2)     Status: None (Preliminary result)  Collection Time: 04/28/19 10:37 AM   Specimen: BLOOD RIGHT ARM  Result Value Ref Range Status   Specimen Description BLOOD RIGHT ARM  Final   Special Requests   Final    BOTTLES DRAWN AEROBIC AND ANAEROBIC Blood Culture adequate volume   Culture   Final    NO GROWTH 3 DAYS Performed at [facility], [address] [place], [place] [address]    Report Status PENDING  Incomplete  Blood culture (routine x 2)     Status: None (Preliminary result)   Collection Time: 04/28/19 10:37 AM   Specimen: BLOOD LEFT ARM  Result Value Ref Range Status   Specimen Description BLOOD LEFT ARM  Final   Special Requests   Final    BOTTLES DRAWN AEROBIC AND ANAEROBIC Blood Culture results may not be optimal due to an inadequate volume of blood received in culture bottles   Culture   Final    NO GROWTH 3 DAYS Performed at [facility], [address], [place], [place] [address]    Report Status PENDING  Incomplete  Respiratory Panel by RT PCR (Flu A&B, Covid) - Nasopharyngeal Swab     Status: None   Collection Time: 04/28/19 11:37 AM   Specimen: Nasopharyngeal Swab  Result Value Ref Range Status   SARS Coronavirus 2 by RT PCR NEGATIVE NEGATIVE Final    Comment: (NOTE) SARS-CoV-2 target nucleic acids are NOT DETECTED. The SARS-CoV-2 RNA is generally detectable in upper respiratoy specimens during the acute phase of infection. The lowest concentration of SARS-CoV-2 viral copies this assay can detect is 131 copies/mL. A negative result does not  preclude SARS-Cov-2 infection and should not be used as the sole basis for treatment or other patient management decisions. A negative result may occur with  improper specimen collection/handling, submission of specimen other than nasopharyngeal swab, presence of viral mutation(s) within the areas targeted by this assay, and inadequate number of viral copies (<131 copies/mL). A negative result must be combined with clinical observations, patient history, and epidemiological information. The expected result is Negative. Fact Sheet for Patients:  PinkCheek.be Fact Sheet for Healthcare Providers:  GravelBags.it This test is not yet ap proved or cleared by the [place] FDA and  has been authorized for detection and/or diagnosis of SARS-CoV-2 by FDA under an Emergency Use Authorization (EUA). This EUA will remain  in effect (meaning this test can be used) for the duration of the COVID-19 declaration under Section 564(b)(1) of the Act, 21 U.S.C. section 360bbb-3(b)(1), unless the authorization is terminated or revoked sooner.    Influenza A by PCR NEGATIVE NEGATIVE Final   Influenza B by PCR NEGATIVE NEGATIVE Final    Comment: (NOTE) The Xpert Xpress SARS-CoV-2/FLU/RSV assay is intended as an aid in  the diagnosis of influenza from Nasopharyngeal swab specimens and  should not be used as a sole basis for treatment. Nasal washings and  aspirates are unacceptable for Xpert Xpress SARS-CoV-2/FLU/RSV  testing. Fact Sheet for Patients: PinkCheek.be Fact Sheet for Healthcare Providers: GravelBags.it This test is not yet approved or cleared by the [place] FDA and  has been authorized for detection and/or diagnosis of SARS-CoV-2 by  FDA under an Emergency Use Authorization (EUA). This EUA will remain  in effect (meaning this test can be used) for the duration of the    Covid-19 declaration under Section 564(b)(1) of the Act, 21  U.S.C. section 360bbb-3(b)(1), unless the authorization is  terminated or revoked. Performed at [facility], [phone]  [address] [place], [place] [address]   MRSA PCR Screening     Status: None   Collection Time: 04/29/19  4:52 PM   Specimen: Nasal Mucosa; Nasopharyngeal  Result Value Ref Range Status   MRSA by PCR NEGATIVE NEGATIVE Final    Comment:        The GeneXpert MRSA Assay (FDA approved for NASAL specimens only), is one component of a comprehensive MRSA colonization surveillance program. It is not intended to diagnose MRSA infection nor to guide or monitor treatment for MRSA infections. Performed at [facility], [address], [place], [place] [address]          Radiology Studies: No results found.      Scheduled Meds: . aspirin EC  81 mg Oral Daily  . Chlorhexidine Gluconate Cloth  6 each Topical Daily  . dextromethorphan-guaiFENesin  1 tablet Oral BID  . enoxaparin (LOVENOX) injection  40 mg Subcutaneous Q24H  . ethambutol  800 mg Oral Daily  . feeding supplement (ENSURE ENLIVE)  237 mL Oral TID BM  . finasteride  5 mg Oral Daily  . FLUoxetine  20 mg Oral Daily  . fluticasone furoate-vilanterol  2 puff Inhalation QHS  . folic acid  1 mg Oral Daily  . furosemide  40 mg Intravenous Q12H  . gabapentin  900 mg Oral QHS  . ipratropium-albuterol  3 mL Nebulization Q6H  . metoprolol succinate  12.5 mg Oral Daily  . mirtazapine  7.5 mg Oral QHS  . montelukast  10 mg Oral QHS  . multivitamin with minerals  1 tablet Oral Daily  . nicotine  21 mg Transdermal Q24H  . pantoprazole  40 mg Oral Daily  . potassium chloride  40 mEq Oral Once  . rifampin  600 mg Oral Daily  . roflumilast  250 mcg Oral QHS  . rosuvastatin  5 mg Oral QHS  . tamsulosin  0.4 mg Oral QPC supper  . thiamine  100 mg Oral Daily   Or  . thiamine  100 mg Intravenous Daily   Continuous Infusions: . sodium  chloride Stopped (05/01/19 1104)  . ceFEPime (MAXIPIME) IV Stopped (05/01/19 1059)    Assessment & Plan:   Principal Problem:   Acute on chronic respiratory failure with hypoxia (HCC) Active Problems:   COPD exacerbation (HCC)   Essential hypertension   Hyperlipidemia   Gastroesophageal reflux disease without esophagitis   History of MAC infection   Acute on chronic systolic CHF (congestive heart failure) (HCC)   Elevated troponin   HCAP (healthcare-associated pneumonia)   Sepsis ([place])   CAD (coronary artery disease)   BPH (benign prostatic hyperplasia)   Depression   Acute on chronic respiratory failure with hypoxia: Likely due to multifactorial etiology, including COPD exacerbation, possible HCAP, acute CHF exacerbation and history of MAC infection.  Patient has leukocytosis, bilateral infiltration on CT angiogram, indicating possible HCAP.   Elevated BNP>4500 Not much wheezing on exam so Solu-Medrol was discontinued On nasal cannula for oxygen support keep O2> 92% -Bronchodilators -continue IV diuretics-increased to 40 mg twice daily   Possible HCAP: -Continue IV Vancomycin and cefepime (patient received 1 dose of Rocephin and azithromycin) - Mucinex for cough  - Bronchodilators - Urine legionella and S. pneumococcal antigen - Follow up blood culture x2, sputum culture   History of MAC infection: -continue ethambutol and rifampin  Sepsis: Patient meets critical for sepsis with leukocytosis, tachycardia and tachypnea.  Lactic acid elevated 2.2, which is normalized now. -Will not give  IV fluid since patient has a CHF exacerbation -Follow-up of blood culture negative so far -hemodynamics stable.  Continue to monitor  Acute on chronic systolic CHF: 2D echo on 0/07/7701 showed EF 35-40% -Volume overloaded. Continue lasix iv Strict I's and O's Daily weight Cardiology recommends conservative cardiac management and to follow-up as outpatient for heart failure  cardiomyopathy management Hold losartan as bp dropped to sbp 80's. Decreased beta blk from 6m to 12.5 due to bp being low and needs to diurese  HTN:  -Continue home medications: -hydralazine prn  Hyperlipidemia -Crestor  Gastroesophageal reflux disease without esophagitis -Protonix  Elevated troponin and hx of CAD: Patient has had some left-sided chest pain.  CT angiogram is negative for PE.  Troponin elevated.  Likely due to demand ischemia.  -Crestor, metoprolol -Aspirin -Trend troponin -will check A1c, FLP  BPH: stable - Continue Flomax and Proscar   Depression: Stable, no suicidal or homicidal ideations. -Continue home medications  DVT prophylaxis: Lovenox Code Status: Full Family Communication: None at bedside Disposition Plan: Back home, and likely to days as he is still volume overloaded and not at baseline.  Also treating for pneumonia and has elevated troponin . Barriers: not medically stable as he is still volume overloaded, has pna, and needs to be more stable from cardiac stand point.       LOS: 3 days   Time spent: 45 minutes with more than 50% on [place] [facility] Pager [phone]  If 7PM-7AM, please contact night-coverage [email or web address] Password [email or web address]3/06/2019, 7:02 PM Patient ID: KDIA JEFFERYS male   DOB: 404/29/1957 64y.o.   MRN: [id number]

## 2019-05-01 NOTE — Progress Notes (Signed)
[place] visited pt. as follow up from previous visits; pt. coping effectively; would like to get out of hospital and 'get some fresh air' since the weather has been so warm lately; pt. says family members have been calling and he seems well supported; Pt. wanting to get some sleep so [place] excused himself, but pt. grateful for [facility] visit.    04/30/19 2100  Clinical Encounter Type  Visited With Patient  Visit Type Social support;Critical Care  Referral From Patient;Other (Comment) (Routine Rounding)  Spiritual Encounters  Spiritual Needs Emotional  Stress Factors  Patient Stress Factors Health changes;Loss of control

## 2019-05-01 NOTE — Progress Notes (Signed)
[facility] Cardiology    SUBJECTIVE:  Patient still in the ICU states to be doing much better his breathing is improved denies in has still has a mild cough nose to hopefully discharge stone since he is feels better   Vitals:   05/01/19 1000 05/01/19 1100 05/01/19 1200 05/01/19 1300  BP: (!) 121/97 (!) 115/93 131/88 (!) 135/98  Pulse: 80 83 80 81  Resp: 14 (!) 25 (!) 25 (!) 21  Temp:   97.6 F (36.4 C)   TempSrc:   Axillary   SpO2: 94% 100% 98% 97%  Weight:      Height:         Intake/Output Summary (Last 24 hours) at 05/01/2019 1435 Last data filed at 05/01/2019 1327 Gross per 24 hour  Intake 1321.93 ml  Output 1700 ml  Net -378.07 ml      PHYSICAL EXAM  General: Well developed, well nourished, in no acute distress HEENT:  Normocephalic and atramatic Neck:  No JVD.  Lungs: Clear bilaterally to auscultation and percussion. Heart: HRRR . Normal S1 and S2 without gallops or murmurs.  Abdomen: Bowel sounds are positive, abdomen soft and non-tender  Msk:  Back normal, normal gait. Normal strength and tone for age. Extremities: No clubbing, cyanosis or edema.   Neuro: Alert and oriented X 3. Psych:  Good affect, responds appropriately   LABS: Basic Metabolic Panel: Recent Labs    04/29/19 1620 04/29/19 1620 04/30/19 0409 05/01/19 0459  NA 139  --   --  136  K 3.8  --   --  3.1*  CL 100  --   --  97*  CO2 24  --   --  25  GLUCOSE 92  --   --  91  BUN 16  --   --  16  CREATININE 1.08   < > 1.12 1.17  CALCIUM 8.8*  --   --  8.6*  MG 2.2  --   --  1.8   < > = values in this interval not displayed.   Liver Function Tests: No results for input(s): AST, ALT, ALKPHOS, BILITOT, PROT, ALBUMIN in the last 72 hours. No results for input(s): LIPASE, AMYLASE in the last 72 hours. CBC: Recent Labs    05/01/19 0459  WBC 10.7*  HGB 13.6  HCT 41.7  MCV 89.3  PLT 307   Cardiac Enzymes: No results for input(s): CKTOTAL, CKMB, CKMBINDEX, TROPONINI in the last 72 hours. BNP:  Invalid input(s): POCBNP D-Dimer: No results for input(s): DDIMER in the last 72 hours. Hemoglobin A1C: Recent Labs    04/29/19 0512  HGBA1C 5.6   Fasting Lipid Panel: Recent Labs    04/29/19 0512  CHOL 218*  HDL 37*  LDLCALC 161*  TRIG 99  CHOLHDL 5.9   Thyroid Function Tests: No results for input(s): TSH, T4TOTAL, T3FREE, THYROIDAB in the last 72 hours.  Invalid input(s): FREET3 Anemia Panel: No results for input(s): VITAMINB12, FOLATE, FERRITIN, TIBC, IRON, RETICCTPCT in the last 72 hours.  No results found.   Echo  Echocardiogram with ejection fraction around 35-40%  TELEMETRY:  Normal sinus rhythm nonspecific ST rescinded:  ASSESSMENT AND PLAN:  Principal Problem:   Acute on chronic respiratory failure with hypoxia (HCC) Active Problems:   COPD exacerbation (HCC)   Essential hypertension   Hyperlipidemia   Gastroesophageal reflux disease without esophagitis   History of MAC infection   Acute on chronic systolic CHF (congestive heart failure) (HCC)   Elevated troponin  HCAP (healthcare-associated pneumonia)   Sepsis ([place])   CAD (coronary artery disease)   BPH (benign prostatic hyperplasia)   Depression    1. Sepsis  hospital-acquired pneumonia  congestive heart failure systolic dysfunction  borderline elevated troponin  Chronic obstructive pulmonary disease  GERD  hypertension  hyperlipidemia  known coronary disease  noncompliant.   plan  continue antibiotic therapy  agreed respiratory support including supplemental oxygen as necessary  continue inhalers  agree with hypertension management and control  continue statin therapy for lipid management  increase activity potentially transferred to telemetry  advised patient to refrain from tobacco abuse   Yolonda Kida, MD,  05/01/2019 2:35 PM

## 2019-05-01 NOTE — Progress Notes (Signed)
Initial Nutrition Assessment  DOCUMENTATION CODES:   Severe malnutrition in context of chronic illness, Underweight  INTERVENTION:  Recommend liberalizing diet to 2 gram sodium. If poor PO intake continues consider further liberalization to a regular diet.  Provide Ensure Enlive po TID, each supplement provides 350 kcal and 20 grams of protein.  Provide Magic cup TID with meals, each supplement provides 290 kcal and 9 grams of protein.  Continue MVI daily, thiamine 846 mg daily, folic acid 1 mg daily.  Monitor magnesium, potassium, and phosphorus daily for at least 3 days, MD to replete as needed, as pt is at risk for refeeding syndrome given severe malnutrition.  NUTRITION DIAGNOSIS:   Severe Malnutrition related to chronic illness(COPD, hx MAC infection, CHF) as evidenced by severe fat depletion, severe muscle depletion, 20% weight loss in the past 10 months.  GOAL:   Patient will meet greater than or equal to 90% of their needs  MONITOR:   PO intake, Supplement acceptance, Labs, Weight trends, I & O's  REASON FOR ASSESSMENT:   Other (Comment)(Low BMI)    ASSESSMENT:   64 year old male with PMHx of depression, HTN, asthma, anxiety, arthritis, IBS, sciatica, spinal stenosis of lumbar region, AAA, restless leg syndrome, hx MI, CAD, hx CVA, COPD, GERD, neuropathy, HLD, hx EtOH abuse, CHF with EF 35-40%, MAC infection admitted with acute on chronic respiratory failure with hypoxia, possible HCAP, acute on chronic systolic CHF.   Met with patient at bedside. He reports his appetite is decreased and he is not able to eat well at meals. He reports he has only been eating bites for a few days now. Saw patient on follow-up last week on 2/24 during last admission and at that time he was meeting >100% of his calorie/protein needs. Patient reports he enjoys chocolate Ensure and vanilla Magic Cup and is willing to eat them again to help meet calorie/protein needs.  Patient reports he  continues to lose weight. Patient was 139 lbs on 07/01/2018. He is now 50.4 kg (111.11 lbs). He has lost 27.89 lbs (20% body weight) over the past 10 months, which is significant for time frame.  Medications reviewed and include: folic acid 1 mg daily, Lasix 40 mg Q12hrs IV, Remeron 7.5 mg QHS, MVI daily, nicotine patch, MVI daily, nicotine patch, pantoprazole, thiamine 100 mg daily, cefepime, vancomycin.  Labs reviewed: Potassium 3.1, Chloride 91.  NUTRITION - FOCUSED PHYSICAL EXAM:    Most Recent Value  Orbital Region  Severe depletion  Upper Arm Region  Severe depletion  Thoracic and Lumbar Region  Severe depletion  Buccal Region  Severe depletion  Temple Region  Severe depletion  Clavicle Bone Region  Severe depletion  Clavicle and Acromion Bone Region  Severe depletion  Scapular Bone Region  Severe depletion  Dorsal Hand  Severe depletion  Patellar Region  Severe depletion  Anterior Thigh Region  Severe depletion  Posterior Calf Region  Severe depletion  Edema (RD Assessment)  None  Hair  Reviewed  Eyes  Reviewed  Mouth  Reviewed  Skin  Reviewed  Nails  Reviewed     Diet Order:   Diet Order            Diet Heart Room service appropriate? Yes; Fluid consistency: Thin; Fluid restriction: 1500 mL Fluid  Diet effective now             EDUCATION NEEDS:   No education needs have been identified at this time  Skin:  Skin Assessment: Reviewed RN  Assessment  Last BM:  05/01/2019 large type 5  Height:   Ht Readings from Last 1 Encounters:  04/28/19 5' 5" (1.651 m)   Weight:   Wt Readings from Last 1 Encounters:  04/28/19 50.4 kg   Ideal Body Weight:  61.8 kg  BMI:  Body mass index is 18.49 kg/m.  Estimated Nutritional Needs:   Kcal:  1700-1900  Protein:  85-95 grams  Fluid:  1.8 L/day  Leanne King, MS, RD, LDN Pager number available on Amion 

## 2019-05-02 ENCOUNTER — Inpatient Hospital Stay: Payer: Medicare Other

## 2019-05-02 LAB — URINALYSIS, COMPLETE (UACMP) WITH MICROSCOPIC
Bacteria, UA: NONE SEEN
Bilirubin Urine: NEGATIVE
Glucose, UA: NEGATIVE mg/dL
Hgb urine dipstick: NEGATIVE
Ketones, ur: NEGATIVE mg/dL
Leukocytes,Ua: NEGATIVE
Nitrite: NEGATIVE
Protein, ur: NEGATIVE mg/dL
Specific Gravity, Urine: 1.008 (ref 1.005–1.030)
pH: 6 (ref 5.0–8.0)

## 2019-05-02 LAB — BASIC METABOLIC PANEL
Anion gap: 19 — ABNORMAL HIGH (ref 5–15)
BUN: 23 mg/dL (ref 8–23)
CO2: 24 mmol/L (ref 22–32)
Calcium: 9.3 mg/dL (ref 8.9–10.3)
Chloride: 91 mmol/L — ABNORMAL LOW (ref 98–111)
Creatinine, Ser: 1.25 mg/dL — ABNORMAL HIGH (ref 0.61–1.24)
GFR calc Af Amer: >60 mL/min (ref >60)
GFR calc non Af Amer: >60 mL/min (ref >60)
Glucose, Bld: 102 mg/dL — ABNORMAL HIGH (ref 70–99)
Potassium: 3.1 mmol/L — ABNORMAL LOW (ref 3.5–5.1)
Sodium: 134 mmol/L — ABNORMAL LOW (ref 135–145)

## 2019-05-02 LAB — BRAIN NATRIURETIC PEPTIDE: B Natriuretic Peptide: 1039 pg/mL — ABNORMAL HIGH (ref 0.0–100.0)

## 2019-05-02 MED ORDER — IPRATROPIUM-ALBUTEROL 0.5-2.5 (3) MG/3ML IN SOLN
3.0000 mL | Freq: Three times a day (TID) | RESPIRATORY_TRACT | Status: DC
  Administered 2019-05-02 – 2019-05-03 (×4): 3 mL via RESPIRATORY_TRACT
  Filled 2019-05-02 (×5): qty 3

## 2019-05-02 MED ORDER — FUROSEMIDE 40 MG PO TABS
40.0000 mg | ORAL_TABLET | Freq: Every day | ORAL | Status: DC
  Administered 2019-05-02: 40 mg via ORAL
  Filled 2019-05-02: qty 1

## 2019-05-02 MED ORDER — POTASSIUM CHLORIDE CRYS ER 20 MEQ PO TBCR
40.0000 meq | EXTENDED_RELEASE_TABLET | Freq: Two times a day (BID) | ORAL | Status: DC
  Administered 2019-05-02 (×2): 40 meq via ORAL
  Filled 2019-05-02 (×3): qty 2

## 2019-05-02 NOTE — Progress Notes (Signed)
PROGRESS NOTE    Joseph Peters  DEY:[id number] DOB: Jul 01, 1955 DOA: 04/28/2019 PCP: Barbaraann Boys, MD    Brief Narrative:  Joseph Peters is a 64 y.o. male with medical history significant of hypertension, hyperlipidemia, COPD on 3 L nasal cannula oxygen at home, asthma, stroke, GERD, depression, anxiety, RLS, CAD, CHF with EF 35-40%, IBS, alcohol abuse, tobacco abuse, AAA, BPH, MAC infection, who presents with shortness of breath.pt was found to have WBC 12.4, BNP> 4500, troponin 77, 75, lactic acid 2.2, 1.8, negative COVID-19 PCR, electrolytes renal function okay, temperature normal, blood pressure 161/110, tachycardia, tachypnea, oxygen desaturation to 85% on 3 L home level of oxygen, requiring BiPAP.  Patient is admitted to stepdown as an inpatient.     Consultants:   None  Procedures:  CTA of chest:  1. No evidence acute pulmonary embolism. 2. Worsening airspace disease in the lingula and LEFT lower lobe suggesting pulmonary edema versus pneumonia. 3. Stable dense consolidation in the RIGHT upper lobe and diffuse airspace disease in the RIGHT lower lobe suggesting multifocal pneumonia. 4. Bilateral pleural effusions unchanged. 5. Ascending thoracic aortic aneurysm.  Antimicrobials:   Vanco and cefepime   Subjective: Complaining of some stop and go streaming with urination. No dysuria. Sob about the same  Objective: Vitals:   05/01/19 1942 05/02/19 0453 05/02/19 0842 05/02/19 1208  BP:  113/86 119/83 116/89  Pulse:  94 96 88  Resp:  _0 Temp:  98.3 F (36.8 C) 98.4 F (36.9 C)   TempSrc:  Oral    SpO2: 97% 97% 96% 100%  Weight:  46 kg    Height:        Intake/Output Summary (Last 24 hours) at 05/02/2019 1518 Last data filed at 05/02/2019 0648 Gross per 24 hour  Intake 340 ml  Output 600 ml  Net -260 ml   Filed Weights   04/28/19 1855 05/01/19 1935 05/02/19 0453  Weight: 50.4 kg 45.9 kg 46 kg    Examination:  General exam: Appears calm and  comfortable , nad Respiratory system: more cta , no wheezing or crackles. Cardiovascular system: S1 & S2 heard, RRR.+ JVD,  No murmurs, rubs, gallops or clicks.  Gastrointestinal system: Abdomen is nondistended, soft and nontender. Normal bowel sounds heard. Central nervous system: Alert and oriented. No focal neurological deficits. Extremities: No edema Skin: Warm dry Psychiatry: Judgement and insight appear normal. Mood & affect appropriate.     Data Reviewed: I have personally reviewed following labs and imaging studies  CBC: Recent Labs  Lab 04/27/19 1323 04/28/19 1037 05/01/19 0459  WBC 8.0 12.4* 10.7*  NEUTROABS 5.5  --   --   HGB 13.0 13.2 13.6  HCT 40.2 40.1 41.7  MCV 89.1 88.5 89.3  PLT 234 243 314   Basic Metabolic Panel: Recent Labs  Lab 04/27/19 1323 04/27/19 1323 04/28/19 1037 04/29/19 0512 04/29/19 1620 04/30/19 0409 05/01/19 0459 05/02/19 0853  NA 136  --  135  --  139  --  136 134*  K 4.0  --  3.7  --  3.8  --  3.1* 3.1*  CL 104  --  103  --  100  --  97* 91*  CO2 25  --  21*  --  24  --  25 24  GLUCOSE 88  --  120*  --  92  --  91 102*  BUN 14  --  16  --  16  --  16 23  CREATININE  0.90   < > 1.02  --  1.08 1.12 1.17 1.25*  CALCIUM 8.1*  --  8.4*  --  8.8*  --  8.6* 9.3  MG  --   --   --  1.9 2.2  --  1.8  --    < > = values in this interval not displayed.   GFR: Estimated Creatinine Clearance: 39.9 mL/min (A) (by C-G formula based on SCr of 1.25 mg/dL (H)). Liver Function Tests: Recent Labs  Lab 04/27/19 1323  AST 16  ALT 12  ALKPHOS 72  BILITOT 0.8  PROT 7.0  ALBUMIN 3.0*   No results for input(s): LIPASE, AMYLASE in the last 168 hours. No results for input(s): AMMONIA in the last 168 hours. Coagulation Profile: No results for input(s): INR, PROTIME in the last 168 hours. Cardiac Enzymes: No results for input(s): CKTOTAL, CKMB, CKMBINDEX, TROPONINI in the last 168 hours. BNP (last 3 results) No results for input(s): PROBNP in the  last 8760 hours. HbA1C: No results for input(s): HGBA1C in the last 72 hours. CBG: Recent Labs  Lab 04/28/19 1855  GLUCAP 116*   Lipid Profile: No results for input(s): CHOL, HDL, LDLCALC, TRIG, CHOLHDL, LDLDIRECT in the last 72 hours. Thyroid Function Tests: No results for input(s): TSH, T4TOTAL, FREET4, T3FREE, THYROIDAB in the last 72 hours. Anemia Panel: No results for input(s): VITAMINB12, FOLATE, FERRITIN, TIBC, IRON, RETICCTPCT in the last 72 hours. Sepsis Labs: Recent Labs  Lab 04/28/19 1037 04/28/19 1137 04/28/19 1302  PROCALCITON  --  <0.10  --   LATICACIDVEN 2.2*  --  1.8    Recent Results (from the past 240 hour(s))  Blood culture (routine x 2)     Status: None (Preliminary result)   Collection Time: 04/28/19 10:37 AM   Specimen: BLOOD RIGHT ARM  Result Value Ref Range Status   Specimen Description BLOOD RIGHT ARM  Final   Special Requests   Final    BOTTLES DRAWN AEROBIC AND ANAEROBIC Blood Culture adequate volume   Culture   Final    NO GROWTH 4 DAYS Performed at [facility], [address] [place], [place] [address]    Report Status PENDING  Incomplete  Blood culture (routine x 2)     Status: None (Preliminary result)   Collection Time: 04/28/19 10:37 AM   Specimen: BLOOD LEFT ARM  Result Value Ref Range Status   Specimen Description BLOOD LEFT ARM  Final   Special Requests   Final    BOTTLES DRAWN AEROBIC AND ANAEROBIC Blood Culture results may not be optimal due to an inadequate volume of blood received in culture bottles   Culture   Final    NO GROWTH 4 DAYS Performed at [facility], [address], [place], [place] [address]    Report Status PENDING  Incomplete  Respiratory Panel by RT PCR (Flu A&B, Covid) - Nasopharyngeal Swab     Status: None   Collection Time: 04/28/19 11:37 AM   Specimen: Nasopharyngeal Swab  Result Value Ref Range Status   SARS Coronavirus 2 by RT PCR NEGATIVE NEGATIVE Final    Comment:  (NOTE) SARS-CoV-2 target nucleic acids are NOT DETECTED. The SARS-CoV-2 RNA is generally detectable in upper respiratoy specimens during the acute phase of infection. The lowest concentration of SARS-CoV-2 viral copies this assay can detect is 131 copies/mL. A negative result does not preclude SARS-Cov-2 infection and should not be used as the sole basis for treatment or other patient management decisions.  A negative result may occur with  improper specimen collection/handling, submission of specimen other than nasopharyngeal swab, presence of viral mutation(s) within the areas targeted by this assay, and inadequate number of viral copies (<131 copies/mL). A negative result must be combined with clinical observations, patient history, and epidemiological information. The expected result is Negative. Fact Sheet for Patients:  PinkCheek.be Fact Sheet for Healthcare Providers:  GravelBags.it This test is not yet ap proved or cleared by the [place] FDA and  has been authorized for detection and/or diagnosis of SARS-CoV-2 by FDA under an Emergency Use Authorization (EUA). This EUA will remain  in effect (meaning this test can be used) for the duration of the COVID-19 declaration under Section 564(b)(1) of the Act, 21 U.S.C. section 360bbb-3(b)(1), unless the authorization is terminated or revoked sooner.    Influenza A by PCR NEGATIVE NEGATIVE Final   Influenza B by PCR NEGATIVE NEGATIVE Final    Comment: (NOTE) The Xpert Xpress SARS-CoV-2/FLU/RSV assay is intended as an aid in  the diagnosis of influenza from Nasopharyngeal swab specimens and  should not be used as a sole basis for treatment. Nasal washings and  aspirates are unacceptable for Xpert Xpress SARS-CoV-2/FLU/RSV  testing. Fact Sheet for Patients: PinkCheek.be Fact Sheet for Healthcare  Providers: GravelBags.it This test is not yet approved or cleared by the [place] FDA and  has been authorized for detection and/or diagnosis of SARS-CoV-2 by  FDA under an Emergency Use Authorization (EUA). This EUA will remain  in effect (meaning this test can be used) for the duration of the  Covid-19 declaration under Section 564(b)(1) of the Act, 21  U.S.C. section 360bbb-3(b)(1), unless the authorization is  terminated or revoked. Performed at [facility], [address] [place], [place] [address]   MRSA PCR Screening     Status: None   Collection Time: 04/29/19  4:52 PM   Specimen: Nasal Mucosa; Nasopharyngeal  Result Value Ref Range Status   MRSA by PCR NEGATIVE NEGATIVE Final    Comment:        The GeneXpert MRSA Assay (FDA approved for NASAL specimens only), is one component of a comprehensive MRSA colonization surveillance program. It is not intended to diagnose MRSA infection nor to guide or monitor treatment for MRSA infections. Performed at [facility], [address], [place], [place] [address]          Radiology Studies: DG Chest 1 View  Result Date: 05/02/2019 CLINICAL DATA:  Pt states SOB, chest pain, cough for 3 months. Pt states diagnosed with double pneumonia last month. Hx of asthma, hypertension, copd, myocardial infarction, pneumonia. Current smoker EXAM: CHEST  1 VIEW COMPARISON:  Chest radiograph 04/28/2019 FINDINGS: Stable cardiomediastinal contours. Lungs are hyperinflated. Emphysema. Persistent consolidation throughout the right upper lobe. Interval mild decrease in bilateral diffuse opacities in the right lower lung and left mid to lower lung. No new focal opacity. No pneumothorax or large pleural effusion. No acute finding in the visualized skeleton. IMPRESSION: Persistent consolidation in the right upper lobe. Interval improvement in diffuse patchy opacities seen in the right lower and left  mid to lower lungs. Electronically Signed   By: Audie Pinto M.D.   On: 05/02/2019 14:02        Scheduled Meds: . aspirin EC  81 mg Oral Daily  . dextromethorphan-guaiFENesin  1 tablet Oral BID  . enoxaparin (LOVENOX) injection  40 mg Subcutaneous Q24H  . ethambutol  800 mg Oral Daily  . feeding supplement (  ENSURE ENLIVE)  237 mL Oral TID BM  . finasteride  5 mg Oral Daily  . FLUoxetine  20 mg Oral Daily  . fluticasone furoate-vilanterol  2 puff Inhalation QHS  . folic acid  1 mg Oral Daily  . furosemide  40 mg Oral Daily  . gabapentin  900 mg Oral QHS  . ipratropium-albuterol  3 mL Nebulization TID  . metoprolol succinate  12.5 mg Oral Daily  . mirtazapine  7.5 mg Oral QHS  . montelukast  10 mg Oral QHS  . multivitamin with minerals  1 tablet Oral Daily  . nicotine  21 mg Transdermal Q24H  . pantoprazole  40 mg Oral Daily  . potassium chloride  40 mEq Oral BID  . rifampin  600 mg Oral Daily  . roflumilast  250 mcg Oral QHS  . rosuvastatin  5 mg Oral QHS  . sodium chloride flush  10 mL Intravenous Q12H  . tamsulosin  0.4 mg Oral QPC supper  . thiamine  100 mg Oral Daily   Or  . thiamine  100 mg Intravenous Daily   Continuous Infusions: . sodium chloride 250 mL (05/01/19 2126)  . ceFEPime (MAXIPIME) IV 2 g (05/02/19 1013)    Assessment & Plan:   Principal Problem:   Acute on chronic respiratory failure with hypoxia (HCC) Active Problems:   COPD exacerbation (HCC)   Essential hypertension   Hyperlipidemia   Gastroesophageal reflux disease without esophagitis   History of MAC infection   Acute on chronic systolic CHF (congestive heart failure) (HCC)   Elevated troponin   HCAP (healthcare-associated pneumonia)   Sepsis ([place])   CAD (coronary artery disease)   BPH (benign prostatic hyperplasia)   Depression   Acute on chronic respiratory failure with hypoxia: Likely due to multifactorial etiology, including COPD exacerbation, possible HCAP, acute CHF  exacerbation and history of MAC infection.  Patient has leukocytosis, bilateral infiltration on CT angiogram, indicating possible HCAP.   Elevated BNP>4500.Marland KitchenMarland Kitchen>now 1000 Not much wheezing on exam so Solu-Medrol was discontinued On nasal cannula for oxygen support keep O2> 92% -Bronchodilators -will change iv lasix to once a day lasix 45mq po daily   Possible HCAP: -Continue IV Vancomycin and cefepime (patient received 1 dose of Rocephin and azithromycin) - Mucinex for cough  - Bronchodilators - Urine legionella and S. pneumococcal antigen - Follow up blood culture x2, sputum culture   History of MAC infection: -continue ethambutol and rifampin  Sepsis: Patient meets critical for sepsis with leukocytosis, tachycardia and tachypnea.  Lactic acid elevated 2.2, which is normalized now. -Will not give IV fluid since patient has a CHF exacerbation -Follow-up of blood culture negative so far -hemodynamics stable.  Continue to monitor  Acute on chronic systolic CHF: 2D echo on 25/6/2130showed EF 35-40% -Volume overloaded initially, more euvolemic now Strict I's and O's Daily weight Cardiology recommends conservative cardiac management and to follow-up as outpatient for heart failure cardiomyopathy management Hold losartan as bp dropped to sbp 80's. Decreased beta blk from 251mto 12.5 due to bp being low and needs to diurese We will switch Lasix IV to 40 mEq p.o. daily as BNP has decreased   HTN:  -Continue home medications: -hydralazine prn  Hyperlipidemia -Crestor  Gastroesophageal reflux disease without esophagitis -Protonix  Elevated troponin and hx of CAD: Patient has had some left-sided chest pain.  CT angiogram is negative for PE.  Troponin elevated.  Likely due to demand ischemia.  -Crestor, metoprolol -Aspirin -HA1c 5.6  BPH: stable -  Continue Flomax and Proscar Having some streaming complaints, will ck UA today   Depression: Stable, no suicidal or  homicidal ideations. -Continue home medications  DVT prophylaxis: Lovenox Code Status: Full Family Communication: None at bedside Disposition Plan: Back home, and likely to days as he is still volume overloaded and not at baseline.  Also treating for pneumonia and has elevated troponin . Barriers: not medically stable as he is still volume overloaded, has pna, and needs to be more stable from cardiac stand point. If stable will d/c in am       LOS: 4 days   Time spent: 45 minutes with more than 50% on [place], MD [facility] Pager [phone]  If 7PM-7AM, please contact night-coverage [email or web address] Password [facility] 05/02/2019, 3:18 PM Patient ID: Joseph Peters, male   DOB: 1955-03-27, 64 y.o.   MRN: [id number]

## 2019-05-02 NOTE — Plan of Care (Signed)
  Problem: Health Behavior/Discharge Planning: Goal: Ability to manage health-related needs will improve 05/02/2019 0316 by Joanne Gavel, RN Outcome: Progressing 05/02/2019 0314 by Joanne Gavel, RN Outcome: Progressing   Problem: Clinical Measurements: Goal: Will remain free from infection 05/02/2019 0316 by Joanne Gavel, RN Outcome: Progressing 05/02/2019 0314 by Joanne Gavel, RN Outcome: Progressing   Problem: Education: Goal: Knowledge of disease or condition will improve Outcome: Progressing   Problem: Activity: Goal: Ability to tolerate increased activity will improve Outcome: Progressing   Problem: Respiratory: Goal: Ability to maintain a clear airway will improve Outcome: Not Progressing Note: Patient has periods of dyspnea.

## 2019-05-03 LAB — BASIC METABOLIC PANEL
Anion gap: 11 (ref 5–15)
BUN: 29 mg/dL — ABNORMAL HIGH (ref 8–23)
CO2: 25 mmol/L (ref 22–32)
Calcium: 9.4 mg/dL (ref 8.9–10.3)
Chloride: 96 mmol/L — ABNORMAL LOW (ref 98–111)
Creatinine, Ser: 1.19 mg/dL (ref 0.61–1.24)
GFR calc Af Amer: >60 mL/min (ref >60)
GFR calc non Af Amer: >60 mL/min (ref >60)
Glucose, Bld: 99 mg/dL (ref 70–99)
Potassium: 4.6 mmol/L (ref 3.5–5.1)
Sodium: 132 mmol/L — ABNORMAL LOW (ref 135–145)

## 2019-05-03 LAB — CULTURE, BLOOD (ROUTINE X 2)
Culture: NO GROWTH
Culture: NO GROWTH
Special Requests: ADEQUATE

## 2019-05-03 LAB — BRAIN NATRIURETIC PEPTIDE: B Natriuretic Peptide: 1568 pg/mL — ABNORMAL HIGH (ref 0.0–100.0)

## 2019-05-03 MED ORDER — ADULT MULTIVITAMIN W/MINERALS CH: 1.0000 | ORAL_TABLET | Freq: Every day | ORAL | Status: AC

## 2019-05-03 MED ORDER — FOLIC ACID 1 MG PO TABS: 1.0000 mg | ORAL_TABLET | Freq: Every day | ORAL | 0 refills | Status: AC

## 2019-05-03 MED ORDER — FUROSEMIDE 40 MG PO TABS: 40.0000 mg | ORAL_TABLET | Freq: Every day | ORAL | Status: DC

## 2019-05-03 MED ORDER — ASPIRIN 81 MG PO TBEC: 81.0000 mg | DELAYED_RELEASE_TABLET | Freq: Every day | ORAL | Status: AC

## 2019-05-03 MED ORDER — THIAMINE HCL 100 MG PO TABS: 100.0000 mg | ORAL_TABLET | Freq: Every day | ORAL | 0 refills | Status: AC

## 2019-05-03 MED ORDER — FUROSEMIDE 40 MG PO TABS: 40.0000 mg | ORAL_TABLET | Freq: Every day | ORAL | 0 refills | Status: AC

## 2019-05-03 MED ORDER — FUROSEMIDE 10 MG/ML IJ SOLN
40.0000 mg | Freq: Once | INTRAMUSCULAR | Status: AC
  Administered 2019-05-03: 40 mg via INTRAVENOUS
  Filled 2019-05-03: qty 4

## 2019-05-03 MED ORDER — POTASSIUM CHLORIDE CRYS ER 20 MEQ PO TBCR: 20.0000 meq | EXTENDED_RELEASE_TABLET | Freq: Once | ORAL | Status: DC

## 2019-05-03 MED ORDER — METOPROLOL SUCCINATE ER 25 MG PO TB24: 12.5000 mg | ORAL_TABLET | Freq: Every day | ORAL | 0 refills | Status: AC

## 2019-05-03 MED ORDER — NICOTINE 21 MG/24HR TD PT24: 21.0000 mg | MEDICATED_PATCH | TRANSDERMAL | 0 refills | Status: AC

## 2019-05-03 NOTE — Discharge Summary (Signed)
Joseph Peters VQM:[id number] DOB: 05-04-1955 DOA: 04/28/2019  PCP: Barbaraann Boys, MD  Admit date: 04/28/2019 Discharge date: 05/03/2019  Admitted From: Home Disposition: Home  Recommendations for Outpatient Follow-up:  1. Follow up with PCP in 1 week 2. Please obtain BMP/CBC in one week 3. Follow up with CHF clinic 4. Follow up with Dr. Clayborn Bigness cardiology in one week  Home Health:yes   Discharge Condition:Stable CODE STATUS:full  Diet recommendation: Heart Healthy low-sodium Brief/Interim Summary: Joseph Peters is a 64 y.o. male with medical history significant of hypertension, hyperlipidemia, COPD on 3 L nasal cannula oxygen at home, asthma, stroke, GERD, depression, anxiety, RLS, CAD, CHF with EF 35-40%, IBS, alcohol abuse, tobacco abuse, AAA, BPH, MAC infection, who presents with shortness of breath and chest pain. pt was found to have WBC 12.4, BNP> 4500, troponin 77, 75, lactic acid 2.2, 1.8, negative COVID-19 PCR, electrolytes renal function okay, temperature normal, blood pressure 161/110, tachycardia, tachypnea, oxygen desaturation to 85% on 3 L home level of oxygen, requiring BiPAP.  CT of chest was negative for pulmonary embolism.  There was worsening airspace disease in the left lower lobe suggestive of either pulmonary edema versus pneumonia.  He was treated with IV antibiotics for possible HCAP.  He was started on IV Lasix 40 mg twice daily with improvement of his symptoms, his BNP had decreased also.  Cardiology was consulted.  Had an echocardiogram from February 2021 that revealed EF of 30 to 45%.Cardiology recommends conservative cardiac management and to follow-up as outpatient for heart failure cardiomyopathy management.  Due to his very low blood pressure with systolics in the 93O his beta-blocker was decreased from 25 mg to 12.5 mg and his losartan was discontinued.  He was found with elevated troponin which was likely due to demand ischemia.  However work-up to be done as  outpatient by cardiology.  On admission he did meet criteria for sepsis due to leukocytosis, tachycardia, and tachypnea.  His lactic acid was 2.2 which normalized.  Follow-up blood cultures were negative to date.  Respiratory panel which included SARS Covid test was negative. Clinically stable to be discharged home.  Patient was educated personally by me about low-sodium diet, weighing himself daily and if more than 3 pounds weight gain of water weight needs to call his cardiologist.  Also counseled on medical compliance and dietary compliance.  On discharge he was at his baseline oxygen requirement  Discharge Diagnoses:  Principal Problem:   Acute on chronic respiratory failure with hypoxia (HCC) Active Problems:   COPD exacerbation (HCC)   Essential hypertension   Hyperlipidemia   Gastroesophageal reflux disease without esophagitis   History of MAC infection   Acute on chronic systolic CHF (congestive heart failure) (HCC)   Elevated troponin   HCAP (healthcare-associated pneumonia)   Sepsis ([facility])   CAD (coronary artery disease)   BPH (benign prostatic hyperplasia)   Depression    Discharge Instructions  Discharge Instructions    Call MD for:  temperature >100.4   Complete by: As directed    Diet - low sodium heart healthy   Complete by: As directed    Discharge instructions   Complete by: As directed    Do not drink more than 1.5 L of fluid daily Take all your meds NO smoking NO salt, keep salt <1525m per day Follow up with primary care in 3 days , get blood work Follow up with Dr. [facility] next week.   Increase activity slowly   Complete by:  As directed      Allergies as of 05/03/2019      Reactions   Penicillins Swelling, Rash, Other (See Comments)   Swelling in throat Did it involve swelling of the face/tongue/throat, SOB, or low BP? yes Did it involve sudden or severe rash/hives, skin peeling, or any reaction on the inside of your mouth or nose? no Did  you need to seek medical attention at a hospital or doctor's office? Yes When did it last happen?years  If all above answers are "NO", may proceed with cephalosporin use.   Lidocaine Other (See Comments)   Unsure  At dentist's office      Medication List    STOP taking these medications   losartan 25 MG tablet Commonly known as: COZAAR   morphine CONCENTRATE 10 MG/0.5ML Soln concentrated solution   ondansetron 4 MG disintegrating tablet Commonly known as: Zofran ODT   predniSONE 20 MG tablet Commonly known as: Deltasone     TAKE these medications   albuterol 108 (90 Base) MCG/ACT inhaler Commonly known as: VENTOLIN HFA Inhale 2 puffs into the lungs every 4 (four) hours as needed for wheezing or shortness of breath.   aspirin 81 MG EC tablet Take 1 tablet (81 mg total) by mouth daily. Start taking on: May 04, 2019   Daliresp 250 MCG Tabs Generic drug: Roflumilast Take 250 mcg by mouth at bedtime.   ethambutol 400 MG tablet Commonly known as: MYAMBUTOL Take 2 tablets (800 mg total) by mouth daily.   finasteride 5 MG tablet Commonly known as: PROSCAR Take 1 tablet (5 mg total) by mouth daily.   FLUoxetine 20 MG capsule Commonly known as: PROZAC Take 20 mg by mouth daily.   folic acid 1 MG tablet Commonly known as: FOLVITE Take 1 tablet (1 mg total) by mouth daily. Start taking on: May 04, 2019   furosemide 40 MG tablet Commonly known as: LASIX Take 1 tablet (40 mg total) by mouth daily.   gabapentin 300 MG capsule Commonly known as: NEURONTIN Take 900 mg by mouth at bedtime.   metoprolol succinate 25 MG 24 hr tablet Commonly known as: TOPROL-XL Take 0.5 tablets (12.5 mg total) by mouth daily. What changed: how much to take   mirtazapine 7.5 MG tablet Commonly known as: REMERON Take 1 tablet (7.5 mg total) by mouth at bedtime.   montelukast 10 MG tablet Commonly known as: SINGULAIR Take 10 mg by mouth at bedtime.   multivitamin with  minerals Tabs tablet Take 1 tablet by mouth daily. Start taking on: May 04, 2019   nicotine 21 mg/24hr patch Commonly known as: NICODERM CQ - dosed in mg/24 hours Place 1 patch (21 mg total) onto the skin daily.   omeprazole 20 MG capsule Commonly known as: PRILOSEC Take 20 mg by mouth daily.   Oxycodone HCl 10 MG Tabs Take 10 mg by mouth 4 (four) times daily as needed for pain.   rifampin 300 MG capsule Commonly known as: RIFADIN Take 2 capsules (600 mg total) by mouth daily.   rosuvastatin 5 MG tablet Commonly known as: CRESTOR Take 5 mg by mouth at bedtime.   tamsulosin 0.4 MG Caps capsule Commonly known as: FLOMAX Take 1 capsule (0.4 mg total) by mouth daily after supper.   thiamine 100 MG tablet Take 1 tablet (100 mg total) by mouth daily. Start taking on: May 04, 2019   Trelegy Ellipta 100-62.5-25 MCG/INH Aepb Generic drug: Fluticasone-Umeclidin-Vilant Inhale 2 puffs into the lungs at bedtime.  Follow-up Information    [facility] Follow up on 05/07/2019.   Specialty: Cardiology Why: at [facility] through the [facility] entrance Contact information: [place] [address] [place], [place], MD Follow up in 4 day(s).   Specialties: Cardiology, Internal Medicine Contact information: [place] [address] [phone]        Barbaraann Boys, MD Follow up in 3 day(s).   Specialty: Pediatrics Contact information: [address] [place] [address] [phone]          Allergies  Allergen Reactions  . Penicillins Swelling, Rash and Other (See Comments)    Swelling in throat  Did it involve swelling of the face/tongue/throat, SOB, or low BP? yes Did it involve sudden or severe rash/hives, skin peeling, or any reaction on the inside of your mouth or nose? no Did you need to seek medical attention at a hospital  or doctor's office? Yes When did it last happen?years  If all above answers are "NO", may proceed with cephalosporin use.    . Lidocaine Other (See Comments)    Unsure  At dentist's office    Consultations:  Cardiology   Procedures/Studies: DG Chest 1 View  Result Date: 05/02/2019 CLINICAL DATA:  Pt states SOB, chest pain, cough for 3 months. Pt states diagnosed with double pneumonia last month. Hx of asthma, hypertension, copd, myocardial infarction, pneumonia. Current smoker EXAM: CHEST  1 VIEW COMPARISON:  Chest radiograph 04/28/2019 FINDINGS: Stable cardiomediastinal contours. Lungs are hyperinflated. Emphysema. Persistent consolidation throughout the right upper lobe. Interval mild decrease in bilateral diffuse opacities in the right lower lung and left mid to lower lung. No new focal opacity. No pneumothorax or large pleural effusion. No acute finding in the visualized skeleton. IMPRESSION: Persistent consolidation in the right upper lobe. Interval improvement in diffuse patchy opacities seen in the right lower and left mid to lower lungs. Electronically Signed   By: Audie Pinto M.D.   On: 05/02/2019 14:02   DG Chest 2 View  Result Date: 04/07/2019 CLINICAL DATA:  Chest pain and shortness of breath EXAM: CHEST - 2 VIEW COMPARISON:  March 13, 2019 chest radiograph and chest CT February 13, 2019 FINDINGS: There remains consolidation with volume loss in the right upper lobe with slightly improved aeration in this area compared to prior studies. Elsewhere, there is extensive underlying emphysematous change with fibrosis. Fibrosis is most severe in the right base region. No new opacity evident. Heart size and pulmonary vascularity are normal. No adenopathy is evident. There is aortic atherosclerosis. No bone lesions. There is calcification in each carotid artery. IMPRESSION: Underlying emphysematous change in fibrosis. Consolidation in the right upper lobe is slightly less  pronounced than on recent prior studies. No new opacity evident. Stable cardiac silhouette. No adenopathy demonstrable by radiography. Aortic Atherosclerosis (ICD10-I70.0). Electronically Signed   By: Lowella Grip III M.D.   On: 04/07/2019 11:20   CT Angio Chest PE W/Cm &/Or Wo Cm  Result Date: 04/28/2019 CLINICAL DATA:  Short of breath.  Tachycardia. EXAM: CT ANGIOGRAPHY CHEST WITH CONTRAST TECHNIQUE: Multidetector CT imaging of the chest was performed using the standard protocol during bolus administration of intravenous contrast. Multiplanar CT image reconstructions and MIPs were obtained to evaluate the vascular anatomy. CONTRAST:  75 mL Omnipaque COMPARISON:  Chest CT 04/16/2019 FINDINGS: Cardiovascular: No filling defects within the pulmonary arteries to suggest acute pulmonary  embolism. Ascending aorta is aneurysmal at 42 mm (image 30/7). Aneurysm is fusiform. No axillary supraclavicular adenopathy. No mediastinal hilar adenopathy. No pericardial effusion. Mediastinum/Nodes: No axillary supraclavicular adenopathy. No mediastinal adenopathy. Lungs/Pleura: Severe centrilobular emphysema. Superimposed consolidation in the RIGHT upper lobe not changed from prior. Small central cavitation is also similar (image 26/6). Pleural fluid at the RIGHT lung base and airspace disease in the RIGHT lower lobe is also similar. Within the LEFT lung, moderate pleural effusion. New airspace disease in the lower lobe and lingula. These findings in LEFT lung are worsened from comparison exam. Upper Abdomen: Limited view of the liver, kidneys, pancreas are unremarkable. Normal adrenal glands. Musculoskeletal: No aggressive osseous lesion. Review of the MIP images confirms the above findings. IMPRESSION: 1. No evidence acute pulmonary embolism. 2. Worsening airspace disease in the lingula and LEFT lower lobe suggesting pulmonary edema versus pneumonia. 3. Stable dense consolidation in the RIGHT upper lobe and diffuse  airspace disease in the RIGHT lower lobe suggesting multifocal pneumonia. 4. Bilateral pleural effusions unchanged. 5. Ascending thoracic aortic aneurysm. Recommend annual imaging followup by CTA or MRA. This recommendation follows 2010 ACCF/AHA/AATS/ACR/ASA/SCA/SCAI/SIR/STS/SVM Guidelines for the Diagnosis and Management of Patients with Thoracic Aortic Disease. Circulation. 2010; 121: T622-Q333. Aortic aneurysm NOS (ICD10-I71.9) Electronically Signed   By: Suzy Bouchard M.D.   On: 04/28/2019 12:47   CT Angio Chest PE W and/or Wo Contrast  Result Date: 04/16/2019 CLINICAL DATA:  Shortness of breath and hypoxia EXAM: CT ANGIOGRAPHY CHEST WITH CONTRAST TECHNIQUE: Multidetector CT imaging of the chest was performed using the standard protocol during bolus administration of intravenous contrast. Multiplanar CT image reconstructions and MIPs were obtained to evaluate the vascular anatomy. CONTRAST:  106m OMNIPAQUE IOHEXOL 350 MG/ML SOLN COMPARISON:  04/07/2019. FINDINGS: Cardiovascular: Atheromatous calcifications, including the coronary arteries and aorta. Normally opacified pulmonary arteries with no pulmonary arterial filling defects seen. The ascending thoracic aorta measures 4.5 cm in maximum diameter, previously 4.6 cm. Aneurysmal dilatation of the descending thoracic aorta with a maximum diameter of 3.5 cm, previously 3.8 cm. Aneurysmal dilatation of the infrarenal abdominal aorta with a maximum diameter of 4.0 cm on the included portions. Mediastinum/Nodes: Mildly enlarged right hilar lymph nodes with little change. The largest measures 1.1 cm in short axis diameter on image number 55 series 4. Thyroid gland, trachea, and esophagus demonstrate no significant findings. Lungs/Pleura: Stable marked bilateral centrilobular bullous changes. Previously demonstrated right upper lobe consolidation with air bronchograms and focal distal bronchial dilatation without significant change. Small amount of consolidation  in the posterior left lower lobe with little change. Moderate-sized right pleural effusion and small to moderate-sized left pleural effusion, both increased. Upper Abdomen: Dilated in for abdominal aorta as described above, not included in its entirety. Musculoskeletal: Stable 20% inferior endplate compression deformity of a midthoracic vertebral body. Review of the MIP images confirms the above findings. IMPRESSION: 1. No pulmonary emboli. 2. Stable right upper lobe consolidation with air bronchograms and focal distal bronchial dilatation. 3. Stable small amount of consolidation in the posterior left lower lobe. 4. Stable mild right hilar adenopathy, most likely reactive. 5. Stable marked changes of COPD. 6. Stable aneurysmal dilatation of the ascending thoracic aorta and descending thoracic aorta with interval included aneurysmal dilatation of the infrarenal abdominal aorta, not included in its entirety, measuring 4.0 cm. 7. Moderate-sized right pleural effusion and small to moderate-sized left pleural effusion, both increased. 8. Calcific coronary artery and aortic atherosclerosis. Aortic Atherosclerosis (ICD10-I70.0) and Emphysema (ICD10-J43.9). Electronically Signed   By: SRemo Lipps  Joneen Caraway M.D.   On: 04/16/2019 16:28   CT Angio Chest PE W/Cm &/Or Wo Cm  Result Date: 04/07/2019 CLINICAL DATA:  Shortness of breath. EXAM: CT ANGIOGRAPHY CHEST WITH CONTRAST TECHNIQUE: Multidetector CT imaging of the chest was performed using the standard protocol during bolus administration of intravenous contrast. Multiplanar CT image reconstructions and MIPs were obtained to evaluate the vascular anatomy. CONTRAST:  31m OMNIPAQUE IOHEXOL 350 MG/ML SOLN COMPARISON:  Chest radiograph April 07, 2019; chest CT February 13, 2019 FINDINGS: Cardiovascular: There is no demonstrable pulmonary embolus. Ascending thoracic aortic diameter measures 4.6 x 4.5 cm. No dissection is appreciable. The contrast bolus in the aorta is somewhat less  than optimal for dissection assessment. There is localized aneurysmal dilatation in the distal descending thoracic aorta at the diaphragmatic hiatus with localized plaque and calcification along the leftward aspect of this aneurysm. The descending thoracic aorta at the level of the diaphragmatic hiatus measures 3.8 x 3.1 cm. Appearance of this aneurysm is similar to most recent CT. There is aortic atherosclerosis as well as scattered foci of calcification in proximal visualized great vessels. There are foci of coronary artery calcification. There is left ventricular hypertrophy. No pericardial effusion or pericardial thickening. Mediastinum/Nodes: Thyroid appears unremarkable. There are mildly prominent right hilar lymph nodes, largest measuring 1.5 x 1.3 cm. Subcarinal lymph node measures 1.4 x 1.2 cm. No esophageal lesions are evident. Lungs/Pleura: There is a moderate right pleural effusion with a much smaller left pleural effusion. There is extensive underlying emphysematous change with fibrosis in the lower lung regions, significantly more on the right than on the left. There are multiple areas of cicatrization throughout the lungs bilaterally, more severe on the right than on the left. There is consolidation in the anterior segment right upper lobe with air bronchograms and volume loss. No similar consolidation elsewhere. Upper Abdomen: There is upper abdominal aortic atherosclerosis. Visualized upper abdominal structures otherwise appear unremarkable. Musculoskeletal: Anterior wedging of a midthoracic vertebral body is stable. No blastic or lytic bone lesions. No evident chest wall lesions. Review of the MIP images confirms the above findings. IMPRESSION: 1.  No demonstrable pulmonary embolus. 2. Prominence of the ascending thoracic aorta with a measured diameter of 4.6 x 4.5 cm. Ascending thoracic aortic aneurysm. Recommend semi-annual imaging followup by CTA or MRA and referral to cardiothoracic surgery if  not already obtained. This recommendation follows 2010 ACCF/AHA/AATS/ACR/ASA/SCA/SCAI/SIR/STS/SVM Guidelines for the Diagnosis and Management of Patients With Thoracic Aortic Disease. Circulation. 2010; 121:: [id number] Aortic aneurysm NOS (ICD10-I71.9). No dissection evident with contrast bolus somewhat less than optimal for dissection assessment. 3. Localized aneurysmal dilatation of the descending thoracic aorta at the diaphragmatic hiatus level measuring 3.8 x 3.1 cm. Localized plaque and dilatation along the leftward aspect of the aneurysm, also present previously. Note that there is aortic atherosclerosis as well as foci of great vessel and coronary artery calcification. 4. 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 Pleural effusions bilaterally, larger on the right than on the left. Persistent consolidation in the right upper lobe anteriorly with air bronchograms. Appearance most consistent with pneumonia with possibility of superimposed neoplasm not excluded anteriorly toward the apex on the right. 5.  Prominent right hilar and subcarinal lymph nodes. Aortic Atherosclerosis (ICD10-I70.0) and Emphysema (ICD10-J43.9). Aortic aneurysm NOS (ICD10-I71.9). Electronically Signed   By: WLowella GripIII M.D.   On: 04/07/2019 13:17   DG Chest Right Decubitus  Result Date: 04/21/2019 CLINICAL DATA:  Dyspnea, recent right-sided thoracentesis  EXAM: CHEST - RIGHT DECUBITUS; CHEST - LEFT DECUBITUS COMPARISON:  CT 04/16/2019 FINDINGS: Bilateral decubitus views of the chest were obtained. Small to moderate right-sided pleural effusion with small loculated component at the right upper lobe. Trace layering left-sided pleural effusion without loculated component. There is no pneumothorax. Chronic consolidation in the right upper lobe. Fibrotic changes throughout both lungs. Stable heart size. IMPRESSION: 1. Negative for pneumothorax. 2. Small to moderate right-sided pleural  effusion with small loculated component at the right upper lobe. 3. Trace layering left-sided pleural effusion without loculated component. Electronically Signed   By: Davina Poke D.O.   On: 04/21/2019 11:17   DG Chest Left Decubitus  Result Date: 04/21/2019 CLINICAL DATA:  Dyspnea, recent right-sided thoracentesis EXAM: CHEST - RIGHT DECUBITUS; CHEST - LEFT DECUBITUS COMPARISON:  CT 04/16/2019 FINDINGS: Bilateral decubitus views of the chest were obtained. Small to moderate right-sided pleural effusion with small loculated component at the right upper lobe. Trace layering left-sided pleural effusion without loculated component. There is no pneumothorax. Chronic consolidation in the right upper lobe. Fibrotic changes throughout both lungs. Stable heart size. IMPRESSION: 1. Negative for pneumothorax. 2. Small to moderate right-sided pleural effusion with small loculated component at the right upper lobe. 3. Trace layering left-sided pleural effusion without loculated component. Electronically Signed   By: Davina Poke D.O.   On: 04/21/2019 11:17   DG Chest Portable 1 View  Result Date: 04/28/2019 CLINICAL DATA:  Shortness of breath. EXAM: PORTABLE CHEST 1 VIEW COMPARISON:  04/27/2019 FINDINGS: The cardiac silhouette, mediastinal and hilar contours are within normal limits and stable. Stable tortuosity of the thoracic aorta. Stable underlying chronic lung disease with emphysema and pulmonary scarring. Persistent superimposed bilateral infiltrates with persistent right upper lobe airspace consolidation and air bronchograms. Small right pleural effusion. No pneumothorax. IMPRESSION: Stable appearing underlying chronic lung disease/emphysema and pulmonary scarring with superimposed infiltrates and small right effusion. Electronically Signed   By: Marijo Sanes M.D.   On: 04/28/2019 10:56   DG Chest Portable 1 View  Result Date: 04/27/2019 CLINICAL DATA:  Shortness of breath EXAM: PORTABLE CHEST 1 VIEW  COMPARISON:  04/22/2019 FINDINGS: Increased density at the right lung base. Stable right pleural effusion. Stable fibrotic changes. Cardiomediastinal contours and volume loss in the right chest are unchanged. IMPRESSION: Increased patchy atelectasis/consolidation at the right lung base. Otherwise stable appearance. Electronically Signed   By: Macy Mis M.D.   On: 04/27/2019 14:40   DG Chest Port 1 View  Result Date: 04/22/2019 CLINICAL DATA:  Right-sided pleural effusion EXAM: PORTABLE CHEST 1 VIEW COMPARISON:  04/17/2019 FINDINGS: Stable cardiomediastinal contours. Chronic consolidation within the right upper lobe. Fibrotic changes throughout both lungs. No significant pleural fluid collection is seen on frontal view. No pneumothorax IMPRESSION: No pneumothorax status post thoracentesis. Electronically Signed   By: Davina Poke D.O.   On: 04/22/2019 13:06   DG Chest Port 1 View  Result Date: 04/17/2019 CLINICAL DATA:  Right-sided thoracentesis EXAM: PORTABLE CHEST 1 VIEW COMPARISON:  Yesterday FINDINGS: Interstitial and airspace opacity in the right upper lobe with volume loss. Normal heart size and stable mediastinal contours. No visible pneumothorax. Trace left pleural effusion. IMPRESSION: 1. No complicating features after thoracentesis. 2. Emphysema and stable right upper lobe opacity with volume loss. Electronically Signed   By: Monte Fantasia M.D.   On: 04/17/2019 11:17   DG Chest Portable 1 View  Result Date: 04/16/2019 CLINICAL DATA:  Shortness of breath EXAM: PORTABLE CHEST 1 VIEW COMPARISON:  April 08, 2019 chest radiograph and chest CT Jul 05 2019 FINDINGS: Extensive fibrosis is noted throughout the lungs bilaterally. There may be a degree of superimposed interstitial edema given increase in interstitial thickening compared to recent CT examination. There is equivocal increase in interstitial thickening bilaterally compared to most recent chest radiograph. There may be patchy  airspace opacity intermingled with interstitial prominence. There remains extensive consolidation in the right upper lobe. Heart size and pulmonary vascularity are stable and within normal limits given the underlying fibrosis. No adenopathy is appreciable by radiography. No bone lesions. IMPRESSION: Persistent extensive fibrosis with suspected superimposed interstitial edema and possible interspersed airspace opacity. The degree of ill-defined opacity, primarily in the left lower lobe is modestly increased compared to most recent chest radiograph and is definitely increased compared to most recent CT. Consolidation remains in the right upper lobe. Stable cardiac silhouette. Electronically Signed   By: Lowella Grip III M.D.   On: 04/16/2019 14:43   DG Chest Port 1 View  Result Date: 04/08/2019 CLINICAL DATA:  Post right-sided thoracentesis. EXAM: PORTABLE CHEST 1 VIEW COMPARISON:  04/07/2019; chest CT-04/07/2019 FINDINGS: Grossly unchanged cardiac silhouette and mediastinal contours with persistent obscuration of the right heart border and mediastinal stripe secondary to worsening right upper lobe heterogeneous opacities and associated volume loss. Worsening right mid and bilateral lower lung interstitial thickening. Interval reduction/resolution of right-sided pleural effusion post thoracentesis. No pneumothorax. No acute osseous abnormalities. IMPRESSION: 1. Interval reduction/resolution of right-sided pleural effusion post thoracentesis. No pneumothorax. 2. Worsening right heterogeneous/consolidative opacities and associated volume loss. 3. Worsening right mid bilateral lower lung interstitial thickening with broad differential considerations including pulmonary edema versus progression of multifocal infection, including atypical etiologies. Electronically Signed   By: Sandi Mariscal M.D.   On: 04/08/2019 17:31   ECHOCARDIOGRAM COMPLETE  Result Date: 04/08/2019    ECHOCARDIOGRAM REPORT   Patient Name:    Joseph Peters Date of Exam: 04/07/2019 Medical Rec #:  [id number]        Height:       65.0 in Accession #:    [id number]       Weight:       120.6 lb Date of Birth:  1955-06-23        BSA:          1.60 m Patient Age:    34 years         BP:           121/92 mmHg Patient Gender: M                HR:           96 bpm. Exam Location:  [facility] Procedure: 2D Echo, Cardiac Doppler and Color Doppler Indications:     Elevated Troponin  History:         Patient has no prior history of Echocardiogram examinations.                  Risk Factors:Dyslipidemia and Hypertension. Stroke. Pneumonia.                  Myocardial Infarction. Dyspnea. Coronary artery disease. COPD.                  Chest pain. AAA.  Sonographer:     Wilford Sports Rodgers-Jones Referring Phys:  [place] Diagnosing Phys: Isaias Cowman MD IMPRESSIONS  1. Left ventricular ejection fraction, by estimation, is 35 to 40%. The left ventricle has moderately decreased function. The left  ventrical has no regional wall motion abnormalities. The left ventricular internal cavity size was mildly to moderately dilated. Left ventricular diastolic parameters are indeterminate.  2. Right ventricular systolic function is normal. The right ventricular size is normal.  3. No left atrial/left atrial appendage thrombus was detected.  4. The mitral valve is normal in structure and function. Mild mitral valve regurgitation. No evidence of mitral stenosis.  5. The aortic valve is normal in structure and function. Aortic valve regurgitation is not visualized. Mild to moderate aortic valve stenosis.  6. The inferior vena cava is normal in size with greater than 50% respiratory variability, suggesting right atrial pressure of 3 mmHg. FINDINGS  Left Ventricle: Left ventricular ejection fraction, by estimation, is 35 to 40%. The left ventricle has moderately decreased function. The left ventricle has no regional wall motion abnormalities. The left ventricular  internal cavity size was mildly to moderately dilated. There is no. There is no left ventricular hypertrophy. Left ventricular diastolic parameters are indeterminate.  LV Wall Scoring: The basal inferolateral segment, mid inferoseptal segment, apical septal segment, basal inferior segment, and apex are hypokinetic. Right Ventricle: The right ventricular size is normal. No increase in right ventricular wall thickness. Right ventricular systolic function is normal. Left Atrium: Left atrial size was normal in size. Right Atrium: Right atrial size was normal in size. Pericardium: There is no evidence of pericardial effusion. Mitral Valve: The mitral valve is normal in structure and function. Normal mobility of the mitral valve leaflets. Mild mitral valve regurgitation. No evidence of mitral valve stenosis. Tricuspid Valve: The tricuspid valve is normal in structure. Tricuspid valve regurgitation is mild . No evidence of tricuspid stenosis. Aortic Valve: The aortic valve is normal in structure and function. Aortic valve regurgitation is not visualized. Mild to moderate aortic stenosis is present. Aortic valve mean gradient measures 6.7 mmHg. Aortic valve peak gradient measures 9.2 mmHg. Aortic valve area, by VTI measures 0.96 cm. Pulmonic Valve: The pulmonic valve was normal in structure. Pulmonic valve regurgitation is not visualized. No evidence of pulmonic stenosis. Aorta: The aortic root is normal in size and structure. Venous: The inferior vena cava is normal in size with greater than 50% respiratory variability, suggesting right atrial pressure of 3 mmHg. The inferior vena cava and the hepatic vein show a normal flow pattern. IAS/Shunts: No atrial level shunt detected by color flow Doppler.  LEFT VENTRICLE PLAX 2D LVIDd:         5.61 cm  Diastology LVIDs:         4.21 cm  LV e' lateral:   4.35 cm/s LV PW:         0.94 cm  LV E/e' lateral: 13.5 LV IVS:        0.68 cm  LV e' medial:    5.00 cm/s LVOT diam:     2.20  cm  LV E/e' medial:  11.7 LV SV:         23.64 ml LV SV Index:   47.63 LVOT Area:     3.80 cm  RIGHT VENTRICLE RV Basal diam:  3.68 cm RV S prime:     10.40 cm/s TAPSE (M-mode): 1.8 cm LEFT ATRIUM           Index       RIGHT ATRIUM           Index LA diam:      4.20 cm 2.63 cm/m  RA Area:     13.50 cm LA Vol (A2C):  40.0 ml 25.07 ml/m RA Volume:   39.70 ml  24.88 ml/m LA Vol (A4C): 17.9 ml 11.22 ml/m  AORTIC VALVE AV Area (Vmax):    0.94 cm AV Area (Vmean):   0.87 cm AV Area (VTI):     0.96 cm AV Vmax:           152.00 cm/s AV Vmean:          122.333 cm/s AV VTI:            0.246 m AV Peak Grad:      9.2 mmHg AV Mean Grad:      6.7 mmHg LVOT Vmax:         37.45 cm/s LVOT Vmean:        27.900 cm/s LVOT VTI:          0.062 m LVOT/AV VTI ratio: 0.25  AORTA Ao Root diam: 3.60 cm MITRAL VALVE MV Area (PHT): 7.66 cm             SHUNTS MV Decel Time: 99 msec              Systemic VTI:  0.06 m MV E velocity: 58.70 cm/s 103 cm/s  Systemic Diam: 2.20 cm MV A velocity: 52.20 cm/s 70.3 cm/s MV E/A ratio:  1.12       1.5 Isaias Cowman MD Electronically signed by Isaias Cowman MD Signature Date/Time: 04/08/2019/1:00:24 PM    Final    Pulmonary Function Test [facility] Only  Result Date: 04/23/2019 I DO NOT SEE PFT's for this encounter  US THORACENTESIS ASP PLEURAL SPACE W/IMG GUIDE  Result Date: 04/22/2019 INDICATION: 64 year old male with recurrent right-sided pleural effusion. EXAM: ULTRASOUND GUIDED RIGHT THORACENTESIS MEDICATIONS: None. COMPLICATIONS: None immediate. PROCEDURE: An ultrasound guided thoracentesis was thoroughly discussed with the patient and questions answered. The benefits, risks, alternatives and complications were also discussed. The patient understands and wishes to proceed with the procedure. Written consent was obtained. Ultrasound was performed to localize and mark an adequate pocket of fluid in the right chest. The area was then prepped and draped in the normal sterile fashion. 1%  Lidocaine was used for local anesthesia. Under ultrasound guidance a 6 Fr Safe-T-Centesis catheter was introduced. Thoracentesis was performed. The catheter was removed and a dressing applied. FINDINGS: A total of approximately 450 mL of yellow pleural fluid was removed. Samples were sent to the laboratory if requested by the clinical team. IMPRESSION: Successful ultrasound guided right thoracentesis yielding 450 mL of pleural fluid. Electronically Signed   By: Jacqulynn Cadet M.D.   On: 04/22/2019 17:08   US THORACENTESIS ASP PLEURAL SPACE W/IMG GUIDE  Result Date: 04/17/2019 INDICATION: Patient with history of COPD, shortness of breath, right-sided pleural effusion. Request is made for diagnostic and therapeutic right thoracentesis. EXAM: ULTRASOUND GUIDED RIGHT THORACENTESIS MEDICATIONS: 10 mL 2% nesacaine Patient with documented allergy to lidocaine, recent tolerance of nesacaine injection 3/71/69 COMPLICATIONS: None immediate. PROCEDURE: An ultrasound guided thoracentesis was thoroughly discussed with the patient and questions answered. The benefits, risks, alternatives and complications were also discussed. The patient understands and wishes to proceed with the procedure. Written consent was obtained. Ultrasound was performed to localize and mark an adequate pocket of fluid in the right chest. The area was then prepped and draped in the normal sterile fashion. 1% Lidocaine was used for local anesthesia. Under ultrasound guidance a 6 Fr Safe-T-Centesis catheter was introduced. Thoracentesis was performed. The catheter was removed and a dressing applied. FINDINGS: A total of approximately 700  mL of yellow, clear fluid was removed. Samples were sent to the laboratory as requested by the clinical team. IMPRESSION: Successful ultrasound guided right thoracentesis yielding 700 mL of pleural fluid. Read by: Brynda Greathouse PA-C Electronically Signed   By: Aletta Edouard M.D.   On: 04/17/2019 11:26   US  THORACENTESIS ASP PLEURAL SPACE W/IMG GUIDE  Addendum Date: 04/08/2019   ADDENDUM REPORT: 04/08/2019 17:38 ADDENDUM: Note, Nesacaine was utilized for subcutaneous anesthesia purposes given history of lidocaine allergy. Patient tolerated this medication without incident. Electronically Signed   By: Sandi Mariscal M.D.   On: 04/08/2019 17:38   Result Date: 04/08/2019 INDICATION: Symptomatic right-sided sided pleural effusion EXAM: US THORACENTESIS ASP PLEURAL SPACE W/IMG GUIDE COMPARISON:  Chest radiograph-04/07/2019; chest CT-04/06/2018 MEDICATIONS: None. COMPLICATIONS: None immediate. TECHNIQUE: Informed written consent was obtained from the patient after a discussion of the risks, benefits and alternatives to treatment. A timeout was performed prior to the initiation of the procedure. Initial ultrasound scanning demonstrates a small anechoic right-sided pleural effusion. The lower chest was prepped and draped in the usual sterile fashion. 1% lidocaine was used for local anesthesia. An ultrasound image was saved for documentation purposes. An 8 Fr Safe-T-Centesis catheter was introduced under ultrasound guidance. The thoracentesis was performed. The catheter was removed and a dressing was applied. The patient tolerated the procedure well without immediate post procedural complication. The patient was escorted to have an upright chest radiograph. FINDINGS: A total of approximately 450 cc of serous fluid was removed. Requested samples were sent to the laboratory. IMPRESSION: Successful ultrasound-guided right sided thoracentesis yielding 450 cc of pleural fluid. Electronically Signed: By: Sandi Mariscal M.D. On: 04/08/2019 17:33      Subjective: Pt doesn't think he should be discharged as he is still with sob, although at baseline oxygen required. Explained he is clinic more euvolemic , with bnp down.  No new complaints  Discharge Exam: Vitals:   05/03/19 0757 05/03/19 1148  BP: (!) 139/99 120/88  Pulse: 95  87  Resp: 16 18  Temp: 98.2 F (36.8 C)   SpO2: 95% 97%   Vitals:   05/02/19 1956 05/03/19 0520 05/03/19 0757 05/03/19 1148  BP: (!) 119/99 (!) 130/99 (!) 139/99 120/88  Pulse: 89 92 95 87  Resp: _0 Temp: 98.2 F (36.8 C) 98 F (36.7 C) 98.2 F (36.8 C)   TempSrc: Oral Oral Oral   SpO2: 100% 96% 95% 97%  Weight:  46.4 kg    Height:        General: Pt is alert, awake, not in acute distress Heent: no JVD Cardiovascular: RRR, S1/S2 +, no rubs, no gallops Respiratory: CTA bilaterally, no wheezing, no rhonchi Abdominal: Soft, NT, ND, bowel sounds + Extremities: no edema, no cyanosis    The results of significant diagnostics from this hospitalization (including imaging, microbiology, ancillary and laboratory) are listed below for reference.     Microbiology: Recent Results (from the past 240 hour(s))  Blood culture (routine x 2)     Status: None   Collection Time: 04/28/19 10:37 AM   Specimen: BLOOD RIGHT ARM  Result Value Ref Range Status   Specimen Description BLOOD RIGHT ARM  Final   Special Requests   Final    BOTTLES DRAWN AEROBIC AND ANAEROBIC Blood Culture adequate volume   Culture   Final    NO GROWTH 5 DAYS Performed at [facility], [address], [place], [place] [address]    Report Status 05/03/2019  FINAL  Final  Blood culture (routine x 2)     Status: None   Collection Time: 04/28/19 10:37 AM   Specimen: BLOOD LEFT ARM  Result Value Ref Range Status   Specimen Description BLOOD LEFT ARM  Final   Special Requests   Final    BOTTLES DRAWN AEROBIC AND ANAEROBIC Blood Culture results may not be optimal due to an inadequate volume of blood received in culture bottles   Culture   Final    NO GROWTH 5 DAYS Performed at [facility], [address] [place], [place] [address]    Report Status 05/03/2019 FINAL  Final  Respiratory Panel by RT PCR (Flu A&B, Covid) - Nasopharyngeal Swab     Status: None   Collection Time: 04/28/19  11:37 AM   Specimen: Nasopharyngeal Swab  Result Value Ref Range Status   SARS Coronavirus 2 by RT PCR NEGATIVE NEGATIVE Final    Comment: (NOTE) SARS-CoV-2 target nucleic acids are NOT DETECTED. The SARS-CoV-2 RNA is generally detectable in upper respiratoy specimens during the acute phase of infection. The lowest concentration of SARS-CoV-2 viral copies this assay can detect is 131 copies/mL. A negative result does not preclude SARS-Cov-2 infection and should not be used as the sole basis for treatment or other patient management decisions. A negative result may occur with  improper specimen collection/handling, submission of specimen other than nasopharyngeal swab, presence of viral mutation(s) within the areas targeted by this assay, and inadequate number of viral copies (<131 copies/mL). A negative result must be combined with clinical observations, patient history, and epidemiological information. The expected result is Negative. Fact Sheet for Patients:  PinkCheek.be Fact Sheet for Healthcare Providers:  GravelBags.it This test is not yet ap proved or cleared by the [place] FDA and  has been authorized for detection and/or diagnosis of SARS-CoV-2 by FDA under an Emergency Use Authorization (EUA). This EUA will remain  in effect (meaning this test can be used) for the duration of the COVID-19 declaration under Section 564(b)(1) of the Act, 21 U.S.C. section 360bbb-3(b)(1), unless the authorization is terminated or revoked sooner.    Influenza A by PCR NEGATIVE NEGATIVE Final   Influenza B by PCR NEGATIVE NEGATIVE Final    Comment: (NOTE) The Xpert Xpress SARS-CoV-2/FLU/RSV assay is intended as an aid in  the diagnosis of influenza from Nasopharyngeal swab specimens and  should not be used as a sole basis for treatment. Nasal washings and  aspirates are unacceptable for Xpert Xpress SARS-CoV-2/FLU/RSV   testing. Fact Sheet for Patients: PinkCheek.be Fact Sheet for Healthcare Providers: GravelBags.it This test is not yet approved or cleared by the [place] FDA and  has been authorized for detection and/or diagnosis of SARS-CoV-2 by  FDA under an Emergency Use Authorization (EUA). This EUA will remain  in effect (meaning this test can be used) for the duration of the  Covid-19 declaration under Section 564(b)(1) of the Act, 21  U.S.C. section 360bbb-3(b)(1), unless the authorization is  terminated or revoked. Performed at [facility] [address] [place], [place] [address]   MRSA PCR Screening     Status: None   Collection Time: 04/29/19  4:52 PM   Specimen: Nasal Mucosa; Nasopharyngeal  Result Value Ref Range Status   MRSA by PCR NEGATIVE NEGATIVE Final    Comment:        The GeneXpert MRSA Assay (FDA approved for NASAL specimens only), is one component of a comprehensive MRSA colonization surveillance program. It  is not intended to diagnose MRSA infection nor to guide or monitor treatment for MRSA infections. Performed at [facility], [address] [place], [place] [address]      Labs: BNP (last 3 results) Recent Labs    04/28/19 1137 05/02/19 0853 05/03/19 0526  BNP >4,500.0* 1,039.0* 9,379.0*   Basic Metabolic Panel: Recent Labs  Lab 04/28/19 1037 04/28/19 1037 04/29/19 0512 04/29/19 1620 04/30/19 0409 05/01/19 0459 05/02/19 0853 05/03/19 0526  NA 135  --   --  139  --  136 134* 132*  K 3.7  --   --  3.8  --  3.1* 3.1* 4.6  CL 103  --   --  100  --  97* 91* 96*  CO2 21*  --   --  24  --  _0 GLUCOSE 120*  --   --  92  --  91 102* 99  BUN 16  --   --  16  --  16 23 29*  CREATININE 1.02   < >  --  1.08 1.12 1.17 1.25* 1.19  CALCIUM 8.4*  --   --  8.8*  --  8.6* 9.3 9.4  MG  --   --  1.9 2.2  --  1.8  --   --    < > = values in this interval not displayed.    Liver Function Tests: Recent Labs  Lab 04/27/19 1323  AST 16  ALT 12  ALKPHOS 72  BILITOT 0.8  PROT 7.0  ALBUMIN 3.0*   No results for input(s): LIPASE, AMYLASE in the last 168 hours. No results for input(s): AMMONIA in the last 168 hours. CBC: Recent Labs  Lab 04/27/19 1323 04/28/19 1037 05/01/19 0459  WBC 8.0 12.4* 10.7*  NEUTROABS 5.5  --   --   HGB 13.0 13.2 13.6  HCT 40.2 40.1 41.7  MCV 89.1 88.5 89.3  PLT 234 243 307   Cardiac Enzymes: No results for input(s): CKTOTAL, CKMB, CKMBINDEX, TROPONINI in the last 168 hours. BNP: Invalid input(s): POCBNP CBG: Recent Labs  Lab 04/28/19 1855  GLUCAP 116*   D-Dimer No results for input(s): DDIMER in the last 72 hours. Hgb A1c No results for input(s): HGBA1C in the last 72 hours. Lipid Profile No results for input(s): CHOL, HDL, LDLCALC, TRIG, CHOLHDL, LDLDIRECT in the last 72 hours. Thyroid function studies No results for input(s): TSH, T4TOTAL, T3FREE, THYROIDAB in the last 72 hours.  Invalid input(s): FREET3 Anemia work up No results for input(s): VITAMINB12, FOLATE, FERRITIN, TIBC, IRON, RETICCTPCT in the last 72 hours. Urinalysis    Component Value Date/Time   COLORURINE AMBER (A) 05/02/2019 1700   APPEARANCEUR CLEAR (A) 05/02/2019 1700   APPEARANCEUR Clear 11/02/2014 1602   LABSPEC 1.008 05/02/2019 1700   PHURINE 6.0 05/02/2019 1700   GLUCOSEU NEGATIVE 05/02/2019 1700   HGBUR NEGATIVE 05/02/2019 1700   BILIRUBINUR NEGATIVE 05/02/2019 1700   BILIRUBINUR Negative 11/02/2014 1602   KETONESUR NEGATIVE 05/02/2019 1700   PROTEINUR NEGATIVE 05/02/2019 1700   NITRITE NEGATIVE 05/02/2019 1700   LEUKOCYTESUR NEGATIVE 05/02/2019 1700   Sepsis Labs Invalid input(s): PROCALCITONIN,  WBC,  LACTICIDVEN Microbiology Recent Results (from the past 240 hour(s))  Blood culture (routine x 2)     Status: None   Collection Time: 04/28/19 10:37 AM   Specimen: BLOOD RIGHT ARM  Result Value Ref Range Status    Specimen Description BLOOD RIGHT ARM  Final   Special Requests   Final  BOTTLES DRAWN AEROBIC AND ANAEROBIC Blood Culture adequate volume   Culture   Final    NO GROWTH 5 DAYS Performed at [facility], [address] [place], [place] [address]    Report Status 05/03/2019 FINAL  Final  Blood culture (routine x 2)     Status: None   Collection Time: 04/28/19 10:37 AM   Specimen: BLOOD LEFT ARM  Result Value Ref Range Status   Specimen Description BLOOD LEFT ARM  Final   Special Requests   Final    BOTTLES DRAWN AEROBIC AND ANAEROBIC Blood Culture results may not be optimal due to an inadequate volume of blood received in culture bottles   Culture   Final    NO GROWTH 5 DAYS Performed at [facility], [address] [place], [place] [address]    Report Status 05/03/2019 FINAL  Final  Respiratory Panel by RT PCR (Flu A&B, Covid) - Nasopharyngeal Swab     Status: None   Collection Time: 04/28/19 11:37 AM   Specimen: Nasopharyngeal Swab  Result Value Ref Range Status   SARS Coronavirus 2 by RT PCR NEGATIVE NEGATIVE Final    Comment: (NOTE) SARS-CoV-2 target nucleic acids are NOT DETECTED. The SARS-CoV-2 RNA is generally detectable in upper respiratoy specimens during the acute phase of infection. The lowest concentration of SARS-CoV-2 viral copies this assay can detect is 131 copies/mL. A negative result does not preclude SARS-Cov-2 infection and should not be used as the sole basis for treatment or other patient management decisions. A negative result may occur with  improper specimen collection/handling, submission of specimen other than nasopharyngeal swab, presence of viral mutation(s) within the areas targeted by this assay, and inadequate number of viral copies (<131 copies/mL). A negative result must be combined with clinical observations, patient history, and epidemiological information. The expected result is Negative. Fact Sheet for Patients:   PinkCheek.be Fact Sheet for Healthcare Providers:  GravelBags.it This test is not yet ap proved or cleared by the [place] FDA and  has been authorized for detection and/or diagnosis of SARS-CoV-2 by FDA under an Emergency Use Authorization (EUA). This EUA will remain  in effect (meaning this test can be used) for the duration of the COVID-19 declaration under Section 564(b)(1) of the Act, 21 U.S.C. section 360bbb-3(b)(1), unless the authorization is terminated or revoked sooner.    Influenza A by PCR NEGATIVE NEGATIVE Final   Influenza B by PCR NEGATIVE NEGATIVE Final    Comment: (NOTE) The Xpert Xpress SARS-CoV-2/FLU/RSV assay is intended as an aid in  the diagnosis of influenza from Nasopharyngeal swab specimens and  should not be used as a sole basis for treatment. Nasal washings and  aspirates are unacceptable for Xpert Xpress SARS-CoV-2/FLU/RSV  testing. Fact Sheet for Patients: PinkCheek.be Fact Sheet for Healthcare Providers: GravelBags.it This test is not yet approved or cleared by the [place] FDA and  has been authorized for detection and/or diagnosis of SARS-CoV-2 by  FDA under an Emergency Use Authorization (EUA). This EUA will remain  in effect (meaning this test can be used) for the duration of the  Covid-19 declaration under Section 564(b)(1) of the Act, 21  U.S.C. section 360bbb-3(b)(1), unless the authorization is  terminated or revoked. Performed at [facility], [address] [place], [place] [address]   MRSA PCR Screening     Status: None   Collection Time: 04/29/19  4:52 PM   Specimen: Nasal Mucosa; Nasopharyngeal  Result Value Ref Range Status  MRSA by PCR NEGATIVE NEGATIVE Final    Comment:        The GeneXpert MRSA Assay (FDA approved for NASAL specimens only), is one component of a comprehensive MRSA  colonization surveillance program. It is not intended to diagnose MRSA infection nor to guide or monitor treatment for MRSA infections. Performed at [facility], [address] [place],  [address]    Acute on chronic respiratory failure with hypoxia: Likely due to multifactorial etiology, including COPD exacerbation, possible HCAP, acute CHF exacerbation and history of MAC infection. Patient has leukocytosis, bilateral infiltration on CT angiogram, indicating possible HCAP.   Elevated BNP>4500 on admission, now down On nasal cannula for oxygen support keep O2> 92% -Bronchodilators -given one dose of lasix 76m iv x1 today. Then start po.   Possible HCAP: -Continue IV Vancomycin and cefepime (patient received 1 dose of Rocephin and azithromycin), completed course. - Mucinex for cough  - Bronchodilators - Urine legionella and S. pneumococcal antigen - Follow up blood culture x2, sputum culture   History of MAC infection: -continue ethambutol and rifampin  Sepsis: Patient meets critical for sepsis with leukocytosis, tachycardia and tachypnea. Lactic acid elevated 2.2, which is normalized now. -Resolved. Hemodynamics stable  Acute on chronic systolic CHF: 2D echo on 21/6/9678showed EF 35-40% -Volume overloaded initially, more euvolemic now Strict I's and O's Daily weight Cardiology recommends conservative cardiac management and to follow-up as outpatient for heart failure cardiomyopathy management D/c losartan as bp dropped to sbp 80's.-can resume as outpt when he follows up with cardiology /and chf clinic if bp tolerates Continue beta blk and lasix    HTN:  -stable. Continue beta blk  Hyperlipidemia -Crestor  Gastroesophageal reflux disease without esophagitis -Protonix  Elevated troponin and hx of CAD: Patient has had some left-sided chest pain. CT angiogram is negative for PE. Troponin elevated. Likely due to demand ischemia.   -Crestor, metoprolol -Aspirin -HA1c 5.6  BPH: stable - Continue Flomax and Proscar Having some streaming complaints, will ck UA today   Depression:Stable, no suicidal or homicidal ideations. -Continue home medications  Time coordinating discharge: Over 30 minutes  SIGNED:   SNolberto Hanlon MD  [facility] 05/03/2019, 2:40 PM Pager   If 7PM-7AM, please contact night-coverage [email or web address] Password [email or web address]

## 2019-05-03 NOTE — Progress Notes (Signed)
Patient discharged to home. Tele and IV d/c'd.  Patient brother to pick him up.  This RN asked patient if his brother was bringing his oxygen and patient stated that his brother was not bringing it and he would be ok until he got home.  Patient states he has done this before.

## 2019-05-03 NOTE — TOC Transition Note (Addendum)
Transition of Care [facility]) - CM/SW Discharge Note   Patient Details  Name: Joseph Peters MRN: [id number] Date of Birth: 04/08/1955  Transition of Care [facility]) CM/SW Contact:  Ova Freshwater Phone Number: [phone] 05/03/2019, 11:39 AM   Clinical Narrative:    Spoke with patient.  Informed patient he will be discharging today, as per Dr. Kurtis Bushman.  Verified with patient that he was receiving home health, RN, and PT services form [facility].  Patient confirmed he was receiving these services before coming to [facility]. This SW asked the patient he wanted to continue with [facility], and he said "yes".  The patient asked this SW if he was being discharged today, this SW stated that he was. The patient stated "but I'm so sick." This SW recommended the patient address concerns about discharge to RN/MD.  Patient stated understanding.  Spoke with patient's wife Joseph Peters about discharge plan.  Ms. Hackenberg was agitated and concerned about patient returning since "noone can figure out what's wrong with him and he's going to die in two days if he leaves."  This SW explained to Ms. Balducci that I spoke to Mr. Filip and told him he is able to appeal the discharge and Mr. Capano refused to do so stating, "I don't want to go through all that." Ms. Hughlett asked this SW contact patient's brother Joseph Peters about picking patient up.  This SW contacted [name] and he stated he was on his way to pick up the patient.  This SW informed the nurse the patient's brother was on his way to pick him up.     Barriers to Discharge: Continued Medical Work up   Patient Goals and CMS Choice Patient states their goals for this hospitalization and ongoing recovery are:: to return home and resume home health services.      Discharge Placement                       Discharge Plan and Services                              Date [place]: 04/29/19 Time [place]:  [id number] Representative spoke with at [place]: [place] ([place]) Interventions     Readmission Risk Interventions Readmission Risk Prevention Plan 04/29/2019 04/21/2019  Transportation Screening Complete Complete  PCP or Specialist Appt within 3-5 Days - Complete  HRI or [place] - Complete  Palliative Care Screening - Complete  Medication Review (RN Care Manager) Complete Referral to Pharmacy  PCP or Specialist appointment within 3-5 days of discharge Complete -  [place] or Home Care Consult Complete -  Some recent data might be hidden

## 2019-05-04 ENCOUNTER — Telehealth: Payer: Self-pay | Admitting: Family

## 2019-05-04 NOTE — Telephone Encounter (Signed)
Spoke with patient to follow up since his recent hospital d/c and Confirmed his new patient appt scheduled with [place] for 3/11 in the CHF Clinic. Patient said he is still feeling bad with SOB but otherwise doing ok. He is checking his weight but not daily and was advised to start doing so. He is taking his meds without issues and following a low sodium diet. His SOB does get worse with exertion.    Alyse Low, [place]

## 2019-05-05 ENCOUNTER — Other Ambulatory Visit: Payer: Medicare Other | Admitting: Primary Care

## 2019-05-05 ENCOUNTER — Other Ambulatory Visit: Payer: Self-pay

## 2019-05-05 DIAGNOSIS — Z515 Encounter for palliative care: Secondary | ICD-10-CM

## 2019-05-05 NOTE — Progress Notes (Signed)
[facility] Consult Note Telephone: [phone]  Fax: [phone]    PATIENT NAME: Joseph Peters [address] [place] [address] [phone] (home)  DOB: 20-Aug-1955 MRN: [id number]  PRIMARY CARE PROVIDER:   Barbaraann Boys, MD, [place] [address] [phone]  REFERRING PROVIDER:  Barbaraann Boys, [place],  [place] [address] [phone]  RESPONSIBLE PARTY:   Extended Emergency Contact Information Primary Emergency Contact: Postlewaite,Barbara R Address: [address] [place], [place] [address] [place] Phone: [phone] Mobile Phone: [phone] Relation: Spouse  I met with patient and wife in home. Home health RN was also present.   ASSESSMENT AND RECOMMENDATIONS:   1. Advance Care Planning/Goals of Care: Goals include to maximize quality of life and symptom management. We discussed advance care planning. Patient states that he is an organ donor and then wants to be cremated and no service. I explained advance care planning was focused more on medical choices whether he wanted to be hospitalized and what was his opinion of quality of life. He stated that he wanted everything done that he wanted to live and that he wanted to review the most form and think about the choices. I explained the most form and went over the choices and what they meant. He and his wife will discuss and then we will complete on our next visit.  We discussed also goals of care. There was some discussion that they are unable to make an appointment due to the fact they have locked the keys in their van. I suggested they call their insurance company and see if they have a roadside assistance benefit. He stated he would do it or call someone to help them. We also discussed home health visits; there was a home health nurse present who admitted him to [facility] and said that nursing  visits would come weekly to help him with his congestive heart failure management.  2. Symptom Management:   Smoking cessation: States he is done with smoking and took patch off and does not crave cigarettes now.States ceased x 1 month.  States he is sick and knows he has to quit. There are other smokers in the home.  Dyspnea/ Oxygen: Has concentrator on 3 L. Has back up tanks but did not get instruction. He will call [employer] and ask for more assistance. We reviewed his inhalers and RN taught IS. Endorses some hemoptysis but it's improving.   Heart Failure: Has appt with heart clinic but states no transportation and refuses to use [facility] transportation services.  We discussed need to attend heart failure clinic visits. He will see about getting someone to get the keys out of his car where they are locked in, preventing him to go to his appt.  Medications: Reviewed with [facility] RN, has new inhalers. We discussed fluid pills and the indication. We discussed his new Trelegy inhalers too.  Diarrhea: States he is having some from many antibiotics. Recommend probiotics. He has IBS, does not like yogurt.   3. Family /Caregiver/Community Supports: Lives with wife in mobile home with pets. Has [facility].  4. Cognitive / Functional decline: A and O x3, needs help with adls.  5. Follow up Palliative Care Visit: Palliative care will continue to follow for goals of care clarification and symptom management. Return 3-4 weeks or prn.  I spent 60  minutes providing this consultation,  from 1100 to 1200. More than 50% of the time in this consultation was spent coordinating communication.   HISTORY OF PRESENT ILLNESS:  BRYSYN BRANDENBERGER is a 64 y.o. year old male with multiple medical problems including CHF, COPD, pneumonia infection,disc disease, spinal stenosis, h/o tobacco abuse, MI, CvA . Palliative Care was asked to follow this patient by consultation request of Barbaraann Boys, MD to help address advance  care planning and goals of care. This is the initial isit.  CODE STATUS: TBD  PPS: 40% HOSPICE ELIGIBILITY/DIAGNOSIS: TBD  PAST MEDICAL HISTORY:  Past Medical History:  Diagnosis Date  . AAA (abdominal aortic aneurysm) without rupture ([place])   . Abdominal aneurysm ([place]) 2015   being followed but not large enough to surgically treat  . Alcoholism ([place])   . Anxiety   . Arthritis   . Asthma   . Benign hypertension 07/16/2014  . Chest pain on exertion 07/16/2014  . Chronic pain syndrome   . Coccydynia 02/08/2012  . COPD (chronic obstructive pulmonary disease) ([place])    patient smokes  . Coronary artery disease   . 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 Depression   . 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 Dyspnea   . Frequent falls   . GERD (gastroesophageal reflux disease)   . Hyperlipidemia   . IBS (irritable bowel syndrome)   . Myocardial infarction ([place]) 2010  . Neuropathy   . Pneumonia   . Prostatitis   . Restless leg syndrome   . Sciatica   . Spinal stenosis of lumbar region   . Stroke ([place]) 2015   no residual symptoms  . Tremor, essential     SOCIAL HX:  Social History   Tobacco Use  . Smoking status: Light Tobacco Smoker    Packs/day: 0.50    Years: 30.00    Pack years: 15.00    Types: Cigarettes    Start date: 02/26/1973    Last attempt to quit: 09/21/2018    Years since quitting: 0.6  . Smokeless tobacco: Former Systems developer    Quit date: 02/26/1998  . Tobacco comment: smokes 2-3 cig a week 02/12/19  Substance Use Topics  . Alcohol use: Yes    Alcohol/week: 20.0 standard drinks    Types: 20 Cans of beer per week    Comment: week     ALLERGIES:  Allergies  Allergen Reactions  . Penicillins Swelling, Rash and Other (See Comments)    Swelling in throat  Did it involve swelling of the face/tongue/throat, SOB, or low BP? yes Did it involve sudden or severe rash/hives, skin peeling, or any reaction on the inside of your mouth  or nose? no Did you need to seek medical attention at a hospital or doctor's office? Yes When did it last happen?years  If all above answers are "NO", may proceed with cephalosporin use.    . Lidocaine Other (See Comments)    Unsure  At dentist's office     PERTINENT MEDICATIONS:  Outpatient Encounter Medications as of 05/05/2019  Medication Sig  . albuterol (PROVENTIL HFA;VENTOLIN HFA) 108 (90 Base) MCG/ACT inhaler Inhale 2 puffs into the lungs every 4 (four) hours as needed for wheezing or shortness of breath.   Marland Kitchen aspirin EC 81 MG EC tablet Take 1 tablet (81 mg total) by mouth daily.  Marland Kitchen DALIRESP 250 MCG TABS Take 250 mcg by mouth at bedtime.  Marland Kitchen ethambutol (MYAMBUTOL) 400 MG tablet  Take 2 tablets (800 mg total) by mouth daily.  . finasteride (PROSCAR) 5 MG tablet Take 1 tablet (5 mg total) by mouth daily.  Marland Kitchen FLUoxetine (PROZAC) 20 MG capsule Take 20 mg by mouth daily.   . Fluticasone-Umeclidin-Vilant (TRELEGY ELLIPTA) 100-62.5-25 MCG/INH AEPB Inhale 2 puffs into the lungs at bedtime.   . folic acid (FOLVITE) 1 MG tablet Take 1 tablet (1 mg total) by mouth daily.  . furosemide (LASIX) 40 MG tablet Take 1 tablet (40 mg total) by mouth daily.  Marland Kitchen gabapentin (NEURONTIN) 300 MG capsule Take 900 mg by mouth at bedtime.   . metoprolol succinate (TOPROL-XL) 25 MG 24 hr tablet Take 0.5 tablets (12.5 mg total) by mouth daily.  . mirtazapine (REMERON) 7.5 MG tablet Take 1 tablet (7.5 mg total) by mouth at bedtime.  . montelukast (SINGULAIR) 10 MG tablet Take 10 mg by mouth at bedtime.  . Multiple Vitamin (MULTIVITAMIN WITH MINERALS) TABS tablet Take 1 tablet by mouth daily.  . nicotine (NICODERM CQ - DOSED IN MG/24 HOURS) 21 mg/24hr patch Place 1 patch (21 mg total) onto the skin daily.  Marland Kitchen omeprazole (PRILOSEC) 20 MG capsule Take 20 mg by mouth daily.  . Oxycodone HCl 10 MG TABS Take 10 mg by mouth 4 (four) times daily as needed for pain.  . rifampin (RIFADIN) 300 MG capsule Take 2 capsules  (600 mg total) by mouth daily.  . rosuvastatin (CRESTOR) 5 MG tablet Take 5 mg by mouth at bedtime.  . tamsulosin (FLOMAX) 0.4 MG CAPS capsule Take 1 capsule (0.4 mg total) by mouth daily after supper.  . thiamine 100 MG tablet Take 1 tablet (100 mg total) by mouth daily.   No facility-administered encounter medications on file as of 05/05/2019.    PHYSICAL EXAM / ROS:    Current and past weights: 110-102 lbs in chart, some edema General: NAD, frail appearing, thin Cardiovascular: no chest pain reported, no edema on LE Pulmonary: + cough,endorses some hemoptysis  Not new,  no increased SOB, DOE, oyxygen 3 L Abdomen: appetite fair, endorses diarrhea, continent of bowel GU: denies dysuria, continent of urine MSK:  no joint deformities, ambulatory with assistance Skin: no rashes or wounds reported Neurological: Weakness, anxious, h/o depression  Jason Coop, NP [facility]  COVID-19 PATIENT SCREENING TOOL  Person answering questions: ___________wife_______ _____   1.  Is the patient or any family member in the home showing any signs or symptoms regarding respiratory infection?               Person with Symptom- __________NA_________________  a. Fever                                                                          Yes___ No___          ___________________  b. Shortness of breath                                                    Yes___ No___          ___________________ c. Cough/congestion  Yes___  No___         ___________________ d. Body aches/pains                                                         Yes___ No___        ____________________ e. Gastrointestinal symptoms (diarrhea, nausea)           Yes___ No___        ____________________  2. Within the past 14 days, has anyone living in the home had any contact with someone with or under investigation for COVID-19?    Yes___ No_X_   Person __________________

## 2019-05-07 ENCOUNTER — Telehealth: Payer: Self-pay | Admitting: Family

## 2019-05-07 ENCOUNTER — Ambulatory Visit: Payer: Medicare Other | Admitting: Family

## 2019-05-07 NOTE — Telephone Encounter (Signed)
Patient did not show for his Heart Failure Clinic appointment on 05/07/19. Will attempt to reschedule.

## 2019-05-08 LAB — FUNGUS CULTURE WITH STAIN

## 2019-05-08 LAB — FUNGAL ORGANISM REFLEX

## 2019-05-08 LAB — FUNGUS CULTURE RESULT

## 2019-05-11 ENCOUNTER — Inpatient Hospital Stay
Admission: EM | Admit: 2019-05-11 | Discharge: 2019-05-14 | DRG: 871 | Disposition: A | Discharge status: Home Health | Payer: Medicare Other | Attending: Student | Admitting: Student

## 2019-05-11 ENCOUNTER — Emergency Department: Payer: Medicare Other

## 2019-05-11 ENCOUNTER — Inpatient Hospital Stay: Payer: Medicare Other

## 2019-05-11 ENCOUNTER — Other Ambulatory Visit: Payer: Self-pay

## 2019-05-11 ENCOUNTER — Encounter: Payer: Self-pay | Admitting: Internal Medicine

## 2019-05-11 DIAGNOSIS — F1011 Alcohol abuse, in remission: Secondary | ICD-10-CM | POA: Diagnosis present

## 2019-05-11 DIAGNOSIS — J9610 Chronic respiratory failure, unspecified whether with hypoxia or hypercapnia: Secondary | ICD-10-CM

## 2019-05-11 DIAGNOSIS — I252 Old myocardial infarction: Secondary | ICD-10-CM

## 2019-05-11 DIAGNOSIS — Z79899 Other long term (current) drug therapy: Secondary | ICD-10-CM

## 2019-05-11 DIAGNOSIS — J440 Chronic obstructive pulmonary disease with acute lower respiratory infection: Secondary | ICD-10-CM | POA: Diagnosis present

## 2019-05-11 DIAGNOSIS — Z8673 Personal history of transient ischemic attack (TIA), and cerebral infarction without residual deficits: Secondary | ICD-10-CM

## 2019-05-11 DIAGNOSIS — Z7982 Long term (current) use of aspirin: Secondary | ICD-10-CM

## 2019-05-11 DIAGNOSIS — Z20822 Contact with and (suspected) exposure to covid-19: Secondary | ICD-10-CM | POA: Diagnosis present

## 2019-05-11 DIAGNOSIS — E872 Acidosis: Secondary | ICD-10-CM | POA: Diagnosis present

## 2019-05-11 DIAGNOSIS — R0600 Dyspnea, unspecified: Secondary | ICD-10-CM

## 2019-05-11 DIAGNOSIS — Y95 Nosocomial condition: Secondary | ICD-10-CM | POA: Diagnosis present

## 2019-05-11 DIAGNOSIS — E43 Unspecified severe protein-calorie malnutrition: Secondary | ICD-10-CM | POA: Diagnosis present

## 2019-05-11 DIAGNOSIS — F329 Major depressive disorder, single episode, unspecified: Secondary | ICD-10-CM | POA: Diagnosis present

## 2019-05-11 DIAGNOSIS — J441 Chronic obstructive pulmonary disease with (acute) exacerbation: Secondary | ICD-10-CM | POA: Diagnosis present

## 2019-05-11 DIAGNOSIS — I110 Hypertensive heart disease with heart failure: Secondary | ICD-10-CM | POA: Diagnosis present

## 2019-05-11 DIAGNOSIS — J189 Pneumonia, unspecified organism: Secondary | ICD-10-CM | POA: Diagnosis not present

## 2019-05-11 DIAGNOSIS — I639 Cerebral infarction, unspecified: Secondary | ICD-10-CM | POA: Diagnosis present

## 2019-05-11 DIAGNOSIS — I714 Abdominal aortic aneurysm, without rupture: Secondary | ICD-10-CM | POA: Diagnosis present

## 2019-05-11 DIAGNOSIS — Z809 Family history of malignant neoplasm, unspecified: Secondary | ICD-10-CM

## 2019-05-11 DIAGNOSIS — E785 Hyperlipidemia, unspecified: Secondary | ICD-10-CM | POA: Diagnosis present

## 2019-05-11 DIAGNOSIS — Z96641 Presence of right artificial hip joint: Secondary | ICD-10-CM | POA: Diagnosis present

## 2019-05-11 DIAGNOSIS — I2510 Atherosclerotic heart disease of native coronary artery without angina pectoris: Secondary | ICD-10-CM | POA: Diagnosis present

## 2019-05-11 DIAGNOSIS — J9621 Acute and chronic respiratory failure with hypoxia: Secondary | ICD-10-CM | POA: Diagnosis not present

## 2019-05-11 DIAGNOSIS — Z8619 Personal history of other infectious and parasitic diseases: Secondary | ICD-10-CM | POA: Diagnosis present

## 2019-05-11 DIAGNOSIS — I5023 Acute on chronic systolic (congestive) heart failure: Secondary | ICD-10-CM | POA: Diagnosis present

## 2019-05-11 DIAGNOSIS — K219 Gastro-esophageal reflux disease without esophagitis: Secondary | ICD-10-CM | POA: Diagnosis present

## 2019-05-11 DIAGNOSIS — F32A Depression, unspecified: Secondary | ICD-10-CM | POA: Diagnosis present

## 2019-05-11 DIAGNOSIS — A419 Sepsis, unspecified organism: Secondary | ICD-10-CM | POA: Diagnosis not present

## 2019-05-11 DIAGNOSIS — N400 Enlarged prostate without lower urinary tract symptoms: Secondary | ICD-10-CM | POA: Diagnosis present

## 2019-05-11 DIAGNOSIS — R7989 Other specified abnormal findings of blood chemistry: Secondary | ICD-10-CM | POA: Diagnosis present

## 2019-05-11 DIAGNOSIS — Z825 Family history of asthma and other chronic lower respiratory diseases: Secondary | ICD-10-CM

## 2019-05-11 DIAGNOSIS — Z681 Body mass index (BMI) 19 or less, adult: Secondary | ICD-10-CM | POA: Diagnosis not present

## 2019-05-11 DIAGNOSIS — Z7951 Long term (current) use of inhaled steroids: Secondary | ICD-10-CM | POA: Diagnosis not present

## 2019-05-11 DIAGNOSIS — I10 Essential (primary) hypertension: Secondary | ICD-10-CM | POA: Diagnosis not present

## 2019-05-11 DIAGNOSIS — F17210 Nicotine dependence, cigarettes, uncomplicated: Secondary | ICD-10-CM | POA: Diagnosis present

## 2019-05-11 DIAGNOSIS — R778 Other specified abnormalities of plasma proteins: Secondary | ICD-10-CM | POA: Diagnosis not present

## 2019-05-11 DIAGNOSIS — Z880 Allergy status to penicillin: Secondary | ICD-10-CM

## 2019-05-11 LAB — CBC WITH DIFFERENTIAL/PLATELET
Abs Immature Granulocytes: 0.05 10*3/uL (ref 0.00–0.07)
Basophils Absolute: 0.1 10*3/uL (ref 0.0–0.1)
Basophils Relative: 1 %
Eosinophils Absolute: 0.3 10*3/uL (ref 0.0–0.5)
Eosinophils Relative: 2 %
HCT: 44.7 % (ref 39.0–52.0)
Hemoglobin: 14.3 g/dL (ref 13.0–17.0)
Immature Granulocytes: 0 %
Lymphocytes Relative: 12 %
Lymphs Abs: 1.4 10*3/uL (ref 0.7–4.0)
MCH: 28.7 pg (ref 26.0–34.0)
MCHC: 32 g/dL (ref 30.0–36.0)
MCV: 89.6 fL (ref 80.0–100.0)
Monocytes Absolute: 0.9 10*3/uL (ref 0.1–1.0)
Monocytes Relative: 8 %
Neutro Abs: 9.4 10*3/uL — ABNORMAL HIGH (ref 1.7–7.7)
Neutrophils Relative %: 77 %
Platelets: 277 10*3/uL (ref 150–400)
RBC: 4.99 MIL/uL (ref 4.22–5.81)
RDW: 14.3 % (ref 11.5–15.5)
WBC: 12.1 10*3/uL — ABNORMAL HIGH (ref 4.0–10.5)
nRBC: 0 % (ref 0.0–0.2)

## 2019-05-11 LAB — LACTIC ACID, PLASMA
Lactic Acid, Venous: 2.5 mmol/L (ref 0.5–1.9)
Lactic Acid, Venous: 3.8 mmol/L (ref 0.5–1.9)
Lactic Acid, Venous: 5.3 mmol/L (ref 0.5–1.9)
Lactic Acid, Venous: 2.9 mmol/L (ref 0.5–1.9)

## 2019-05-11 LAB — HIV ANTIBODY (ROUTINE TESTING W REFLEX): HIV Screen 4th Generation wRfx: NONREACTIVE

## 2019-05-11 LAB — BASIC METABOLIC PANEL
Anion gap: 15 (ref 5–15)
BUN: 9 mg/dL (ref 8–23)
CO2: 23 mmol/L (ref 22–32)
Calcium: 9.4 mg/dL (ref 8.9–10.3)
Chloride: 97 mmol/L — ABNORMAL LOW (ref 98–111)
Creatinine, Ser: 1.09 mg/dL (ref 0.61–1.24)
GFR calc Af Amer: >60 mL/min (ref >60)
GFR calc non Af Amer: >60 mL/min (ref >60)
Glucose, Bld: 104 mg/dL — ABNORMAL HIGH (ref 70–99)
Potassium: 3.7 mmol/L (ref 3.5–5.1)
Sodium: 135 mmol/L (ref 135–145)

## 2019-05-11 LAB — BRAIN NATRIURETIC PEPTIDE: B Natriuretic Peptide: >4500 pg/mL — ABNORMAL HIGH (ref 0.0–100.0)

## 2019-05-11 LAB — TROPONIN I (HIGH SENSITIVITY)
Troponin I (High Sensitivity): 44 ng/L — ABNORMAL HIGH (ref <18)
Troponin I (High Sensitivity): 37 ng/L — ABNORMAL HIGH (ref <18)

## 2019-05-11 LAB — RESPIRATORY PANEL BY RT PCR (FLU A&B, COVID)
Influenza A by PCR: NEGATIVE
Influenza B by PCR: NEGATIVE
SARS Coronavirus 2 by RT PCR: NEGATIVE

## 2019-05-11 LAB — PROCALCITONIN: Procalcitonin: <0.1 ng/mL

## 2019-05-11 LAB — MRSA PCR SCREENING: MRSA by PCR: NEGATIVE

## 2019-05-11 MED ORDER — ADULT MULTIVITAMIN W/MINERALS CH
1.0000 | ORAL_TABLET | Freq: Every day | ORAL | Status: DC
  Administered 2019-05-12 – 2019-05-14 (×3): 1 via ORAL
  Filled 2019-05-11 (×3): qty 1

## 2019-05-11 MED ORDER — FLUTICASONE FUROATE-VILANTEROL 100-25 MCG/INH IN AEPB
1.0000 | INHALATION_SPRAY | Freq: Every day | RESPIRATORY_TRACT | Status: DC
  Administered 2019-05-12 – 2019-05-14 (×3): 1 via RESPIRATORY_TRACT
  Filled 2019-05-11: qty 28

## 2019-05-11 MED ORDER — IOHEXOL 350 MG/ML SOLN
75.0000 mL | Freq: Once | INTRAVENOUS | Status: AC | PRN
  Administered 2019-05-11: 75 mL via INTRAVENOUS

## 2019-05-11 MED ORDER — FLUTICASONE-UMECLIDIN-VILANT 100-62.5-25 MCG/INH IN AEPB: 2.0000 | INHALATION_SPRAY | Freq: Every day | RESPIRATORY_TRACT | Status: DC

## 2019-05-11 MED ORDER — ROSUVASTATIN CALCIUM 10 MG PO TABS
5.0000 mg | ORAL_TABLET | Freq: Every day | ORAL | Status: DC
  Administered 2019-05-11 – 2019-05-13 (×3): 5 mg via ORAL
  Filled 2019-05-11 (×3): qty 1

## 2019-05-11 MED ORDER — PANTOPRAZOLE SODIUM 40 MG PO TBEC
40.0000 mg | DELAYED_RELEASE_TABLET | Freq: Every day | ORAL | Status: DC
  Administered 2019-05-12 – 2019-05-14 (×3): 40 mg via ORAL
  Filled 2019-05-11 (×3): qty 1

## 2019-05-11 MED ORDER — IPRATROPIUM-ALBUTEROL 0.5-2.5 (3) MG/3ML IN SOLN
3.0000 mL | RESPIRATORY_TRACT | Status: DC
  Administered 2019-05-11 (×2): 3 mL via RESPIRATORY_TRACT
  Filled 2019-05-11 (×2): qty 3

## 2019-05-11 MED ORDER — RIFAMPIN 300 MG PO CAPS
600.0000 mg | ORAL_CAPSULE | Freq: Every day | ORAL | Status: DC
  Administered 2019-05-12 – 2019-05-14 (×3): 600 mg via ORAL
  Filled 2019-05-11 (×3): qty 2

## 2019-05-11 MED ORDER — METHYLPREDNISOLONE SODIUM SUCC 40 MG IJ SOLR
40.0000 mg | Freq: Two times a day (BID) | INTRAMUSCULAR | Status: DC
  Administered 2019-05-11 – 2019-05-14 (×6): 40 mg via INTRAVENOUS
  Filled 2019-05-11 (×6): qty 1

## 2019-05-11 MED ORDER — MONTELUKAST SODIUM 10 MG PO TABS
10.0000 mg | ORAL_TABLET | Freq: Every day | ORAL | Status: DC
  Administered 2019-05-11 – 2019-05-13 (×3): 10 mg via ORAL
  Filled 2019-05-11 (×3): qty 1

## 2019-05-11 MED ORDER — DM-GUAIFENESIN ER 30-600 MG PO TB12
1.0000 | ORAL_TABLET | Freq: Two times a day (BID) | ORAL | Status: DC
  Administered 2019-05-11 – 2019-05-14 (×6): 1 via ORAL
  Filled 2019-05-11 (×6): qty 1

## 2019-05-11 MED ORDER — FINASTERIDE 5 MG PO TABS
5.0000 mg | ORAL_TABLET | Freq: Every day | ORAL | Status: DC
  Administered 2019-05-12 – 2019-05-14 (×3): 5 mg via ORAL
  Filled 2019-05-11 (×3): qty 1

## 2019-05-11 MED ORDER — FLUOXETINE HCL 20 MG PO CAPS
20.0000 mg | ORAL_CAPSULE | Freq: Every day | ORAL | Status: DC
  Administered 2019-05-12 – 2019-05-14 (×3): 20 mg via ORAL
  Filled 2019-05-11 (×3): qty 1

## 2019-05-11 MED ORDER — IPRATROPIUM-ALBUTEROL 0.5-2.5 (3) MG/3ML IN SOLN
3.0000 mL | RESPIRATORY_TRACT | Status: DC
  Administered 2019-05-11 – 2019-05-13 (×9): 3 mL via RESPIRATORY_TRACT
  Filled 2019-05-11 (×9): qty 3

## 2019-05-11 MED ORDER — VANCOMYCIN HCL IN DEXTROSE 1-5 GM/200ML-% IV SOLN
1000.0000 mg | Freq: Once | INTRAVENOUS | Status: AC
  Administered 2019-05-11: 1000 mg via INTRAVENOUS
  Filled 2019-05-11: qty 200

## 2019-05-11 MED ORDER — IPRATROPIUM-ALBUTEROL 0.5-2.5 (3) MG/3ML IN SOLN
3.0000 mL | Freq: Once | RESPIRATORY_TRACT | Status: AC
  Administered 2019-05-11: 3 mL via RESPIRATORY_TRACT
  Filled 2019-05-11: qty 3

## 2019-05-11 MED ORDER — GABAPENTIN 300 MG PO CAPS
900.0000 mg | ORAL_CAPSULE | Freq: Every day | ORAL | Status: DC
  Administered 2019-05-11 – 2019-05-13 (×3): 900 mg via ORAL
  Filled 2019-05-11 (×3): qty 3

## 2019-05-11 MED ORDER — FOLIC ACID 1 MG PO TABS
1.0000 mg | ORAL_TABLET | Freq: Every day | ORAL | Status: DC
  Administered 2019-05-12 – 2019-05-14 (×3): 1 mg via ORAL
  Filled 2019-05-11 (×3): qty 1

## 2019-05-11 MED ORDER — MIRTAZAPINE 15 MG PO TABS
7.5000 mg | ORAL_TABLET | Freq: Every day | ORAL | Status: DC
  Administered 2019-05-11 – 2019-05-13 (×3): 7.5 mg via ORAL
  Filled 2019-05-11 (×3): qty 1

## 2019-05-11 MED ORDER — UMECLIDINIUM BROMIDE 62.5 MCG/INH IN AEPB
1.0000 | INHALATION_SPRAY | Freq: Every day | RESPIRATORY_TRACT | Status: DC
  Administered 2019-05-12 – 2019-05-14 (×3): 1 via RESPIRATORY_TRACT
  Filled 2019-05-11: qty 7

## 2019-05-11 MED ORDER — HEPARIN SODIUM (PORCINE) 5000 UNIT/ML IJ SOLN
5000.0000 [IU] | Freq: Three times a day (TID) | INTRAMUSCULAR | Status: DC
  Administered 2019-05-11 – 2019-05-14 (×9): 5000 [IU] via SUBCUTANEOUS
  Filled 2019-05-11 (×10): qty 1

## 2019-05-11 MED ORDER — METOPROLOL SUCCINATE ER 25 MG PO TB24
12.5000 mg | ORAL_TABLET | Freq: Every day | ORAL | Status: DC
  Administered 2019-05-12 – 2019-05-14 (×3): 12.5 mg via ORAL
  Filled 2019-05-11 (×3): qty 1

## 2019-05-11 MED ORDER — ASPIRIN EC 81 MG PO TBEC
81.0000 mg | DELAYED_RELEASE_TABLET | Freq: Every day | ORAL | Status: DC
  Administered 2019-05-12 – 2019-05-14 (×3): 81 mg via ORAL
  Filled 2019-05-11 (×3): qty 1

## 2019-05-11 MED ORDER — HYDROMORPHONE HCL 1 MG/ML IJ SOLN
0.5000 mg | Freq: Once | INTRAMUSCULAR | Status: AC
  Administered 2019-05-11: 0.5 mg via INTRAVENOUS
  Filled 2019-05-11: qty 1

## 2019-05-11 MED ORDER — FUROSEMIDE 10 MG/ML IJ SOLN
40.0000 mg | Freq: Once | INTRAMUSCULAR | Status: AC
  Administered 2019-05-11: 40 mg via INTRAVENOUS
  Filled 2019-05-11: qty 4

## 2019-05-11 MED ORDER — METHYLPREDNISOLONE SODIUM SUCC 125 MG IJ SOLR
125.0000 mg | Freq: Once | INTRAMUSCULAR | Status: AC
  Administered 2019-05-11: 125 mg via INTRAVENOUS
  Filled 2019-05-11: qty 2

## 2019-05-11 MED ORDER — THIAMINE HCL 100 MG PO TABS
100.0000 mg | ORAL_TABLET | Freq: Every day | ORAL | Status: DC
  Administered 2019-05-12 – 2019-05-14 (×3): 100 mg via ORAL
  Filled 2019-05-11 (×3): qty 1

## 2019-05-11 MED ORDER — OXYCODONE HCL 5 MG PO TABS
10.0000 mg | ORAL_TABLET | Freq: Four times a day (QID) | ORAL | Status: DC | PRN
  Administered 2019-05-11: 10 mg via ORAL
  Filled 2019-05-11: qty 2

## 2019-05-11 MED ORDER — ALUM & MAG HYDROXIDE-SIMETH 200-200-20 MG/5ML PO SUSP
30.0000 mL | Freq: Once | ORAL | Status: AC
  Administered 2019-05-11: 30 mL via ORAL
  Filled 2019-05-11: qty 30

## 2019-05-11 MED ORDER — ETHAMBUTOL HCL 400 MG PO TABS
800.0000 mg | ORAL_TABLET | Freq: Every day | ORAL | Status: DC
  Administered 2019-05-12 – 2019-05-14 (×3): 800 mg via ORAL
  Filled 2019-05-11 (×3): qty 2

## 2019-05-11 MED ORDER — VANCOMYCIN HCL 750 MG/150ML IV SOLN
750.0000 mg | INTRAVENOUS | Status: DC
  Filled 2019-05-11: qty 150

## 2019-05-11 MED ORDER — LEVOFLOXACIN IN D5W 750 MG/150ML IV SOLN
750.0000 mg | Freq: Once | INTRAVENOUS | Status: AC
  Administered 2019-05-11: 750 mg via INTRAVENOUS
  Filled 2019-05-11: qty 150

## 2019-05-11 MED ORDER — TAMSULOSIN HCL 0.4 MG PO CAPS
0.4000 mg | ORAL_CAPSULE | Freq: Every day | ORAL | Status: DC
  Administered 2019-05-11 – 2019-05-13 (×3): 0.4 mg via ORAL
  Filled 2019-05-11 (×3): qty 1

## 2019-05-11 MED ORDER — NICOTINE 21 MG/24HR TD PT24
21.0000 mg | MEDICATED_PATCH | TRANSDERMAL | Status: DC
  Administered 2019-05-12 – 2019-05-13 (×3): 21 mg via TRANSDERMAL
  Filled 2019-05-11 (×3): qty 1

## 2019-05-11 MED ORDER — ROFLUMILAST 500 MCG PO TABS
250.0000 ug | ORAL_TABLET | Freq: Every day | ORAL | Status: DC
  Administered 2019-05-11 – 2019-05-13 (×3): 250 ug via ORAL
  Filled 2019-05-11 (×4): qty 1

## 2019-05-11 MED ORDER — ALBUTEROL SULFATE (2.5 MG/3ML) 0.083% IN NEBU
2.5000 mg | INHALATION_SOLUTION | RESPIRATORY_TRACT | Status: DC | PRN
  Administered 2019-05-13: 17:00:00 2.5 mg via RESPIRATORY_TRACT
  Filled 2019-05-11: qty 3

## 2019-05-11 MED ORDER — SODIUM CHLORIDE 0.9 % IV SOLN
2.0000 g | Freq: Two times a day (BID) | INTRAVENOUS | Status: DC
  Administered 2019-05-11 – 2019-05-14 (×6): 2 g via INTRAVENOUS
  Filled 2019-05-11 (×7): qty 2

## 2019-05-11 MED ORDER — ENSURE ENLIVE PO LIQD
237.0000 mL | Freq: Two times a day (BID) | ORAL | Status: DC
  Administered 2019-05-12: 237 mL via ORAL

## 2019-05-11 NOTE — Progress Notes (Signed)
ID brief note. Spoke with Dr Jimmye Norman in ED. Complicated pt being treated for MAC now with worsening resp distress and cough. Check sputum cx. Check MRSA PCR - if neg dc vanco Cont cefepime pending sputum culture. Will see tomorrow

## 2019-05-11 NOTE — Consult Note (Signed)
Pharmacy Antibiotic Note  Joseph Peters is a 64 y.o. male admitted on 05/11/2019 with pneumonia. PMH includes hypertension, hyperlipidemia, COPD on 3 L nasal cannula oxygen at home, asthma, stroke, GERD, depression, anxiety, RLS, CAD, CHF with EF 35-40%, IBS, alcohol abuse, tobacco abuse, AAA, BPH, MAC infection (he is on ethambutol and rifampin outpatient which have been re-ordered).  Pharmacy has been consulted for cefepime and vancomycin dosing. He has a penicillin allergy but has tolerated cephalosporins in the recent past when he was admitted to [facility] His SCr is slightly above his baseline of approximately 1.0 mg/dL  Plan:  1) Vancomycin 1000 mg IV x 1 then 750 mg Q 24 hrs Goal AUC 400-550 Expected AUC: 534.8 SCr used: 1.09 T1/2: 16.4h Css (calculated): 35.5/13.4 mcg/mL  2) start cefepime 2 grams IV every 12 hours  Height: _0  (165.1 cm) Weight: 101 lb (45.8 kg) IBW/kg (Calculated) : 61.5  Temp (24hrs), Avg:98.3 F (36.8 C), Min:98.3 F (36.8 C), Max:98.3 F (36.8 C)  Recent Labs  Lab 05/11/19 1134 05/11/19 1330  WBC 12.1*  --   CREATININE 1.09  --   LATICACIDVEN 2.5* 3.8*    Estimated Creatinine Clearance: 45.5 mL/min (by C-G formula based on SCr of 1.09 mg/dL).    Allergies  Allergen Reactions  . Penicillins Swelling, Rash and Other (See Comments)    Swelling in throat  Did it involve swelling of the face/tongue/throat, SOB, or low BP? yes Did it involve sudden or severe rash/hives, skin peeling, or any reaction on the inside of your mouth or nose? no Did you need to seek medical attention at a hospital or doctor's office? Yes When did it last happen?years  If all above answers are "NO", may proceed with cephalosporin use.    . Lidocaine Other (See Comments)    Unsure  At dentist's office    Antimicrobials this admission: levofloxacin x 1 vancomycin 3/15 >>  cefepime 3/15 >>   Microbiology results: 3/15 BCx: pending 3/15 Sputum: pending  3/15  MRSA PCR: pending  Thank you for allowing pharmacy to be a part of this patient's care.  Dallie Piles 05/11/2019 3:37 PM

## 2019-05-11 NOTE — ED Provider Notes (Signed)
[facility] Emergency Department Provider Note       Time seen: ----------------------------------------- 11:06 AM on 05/11/2019 -----------------------------------------   I have reviewed the triage vital signs and the nursing notes.  HISTORY   Chief Complaint No chief complaint on file.   HPI Joseph Peters is a 64 y.o. male with a history of AAA, alcoholism, anxiety, asthma, chronic pain, COPD, depression, hyperlipidemia, MI, CVA who presents to the ED for shortness of breath.  Patient reports a history of pleural effusion requiring periodic drainage.  Patient states he is on antibiotics currently, has infection that he cannot clear in his lungs.  Past Medical History:  Diagnosis Date  . AAA (abdominal aortic aneurysm) without rupture ([place])   . Abdominal aneurysm ([place]) 2015   being followed but not large enough to surgically treat  . Alcoholism ([place])   . Anxiety   . Arthritis   . Asthma   . Benign hypertension 07/16/2014  . Chest pain on exertion 07/16/2014  . Chronic pain syndrome   . Coccydynia 02/08/2012  . COPD (chronic obstructive pulmonary disease) ([place])    patient smokes  . Coronary artery disease   . 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 Depression   . 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 Dyspnea   . Frequent falls   . GERD (gastroesophageal reflux disease)   . Hyperlipidemia   . IBS (irritable bowel syndrome)   . Myocardial infarction ([place]) 2010  . Neuropathy   . Pneumonia   . Prostatitis   . Restless leg syndrome   . Sciatica   . Spinal stenosis of lumbar region   . Stroke ([place]) 2015   no residual symptoms  . Tremor, essential     Patient Active Problem List   Diagnosis Date Noted  . Acute on chronic systolic CHF (congestive heart failure) ([place]) 04/28/2019  . Elevated troponin 04/28/2019  . HCAP (healthcare-associated pneumonia) 04/28/2019  . Sepsis ([place]) 04/28/2019  . CAD  (coronary artery disease) 04/28/2019  . BPH (benign prostatic hyperplasia) 04/28/2019  . Depression 04/28/2019  . History of MAC infection   . Pleural effusion on right   . SOB (shortness of breath)   . Anxiety about health   . Dyspnea   . Acute on chronic respiratory failure with hypoxia ([place])   . Goals of care, counseling/discussion   . Advanced care planning/counseling discussion   . Palliative care by specialist   . Restless legs syndrome (RLS) 04/01/2019  . Protein-calorie malnutrition, severe 10/15/2018  . Nodule of upper lobe of right lung 10/15/2018  . RLQ abdominal pain 10/14/2018  . Status post total hip replacement, right 07/01/2018  . Strain of right knee 06/26/2018  . Lumbar strain 06/16/2018  . Strain of right hip 06/16/2018  . Hyperlipidemia 01/30/2018  . Rotator cuff tendinitis, right 08/19/2017  . Traumatic complete tear of right rotator cuff 08/19/2017  . Ascending aortic aneurysm ([place]) 08/01/2017  . Tremor, essential 05/28/2017  . Gastroesophageal reflux disease without esophagitis 12/25/2016  . Renal artery stenosis ([place]) 12/05/2015  . 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 Syncope and collapse 12/05/2015  . Essential hypertension 10/15/2015  . COPD exacerbation ([place]) 01/31/2015  . Acute respiratory failure ([place]) 01/31/2015  . Drug allergy 01/30/2015  . AAA (abdominal aortic aneurysm) ([place]) 11/23/2014  . Prostate cancer screening 11/02/2014  . Gross hematuria 11/02/2014  . Benign hypertension 07/16/2014  . 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 Occlusion and stenosis of unspecified carotid artery 05/14/2012  . Coccydynia 02/08/2012  . 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 Gait disorder 02/08/2012  . Spinal stenosis of lumbar region 01/13/2009  . Atherosclerotic heart disease of native coronary artery without angina pectoris 06/28/2008  . Sciatica 06/28/2008  . Recurrent major  depressive disorder in partial remission ([place]) 06/28/2008    Past Surgical History:  Procedure Laterality Date  . CARDIAC CATHETERIZATION  2012  . COLONOSCOPY WITH PROPOFOL N/A 11/14/2016   Procedure: COLONOSCOPY WITH PROPOFOL;  Surgeon: Manya Silvas, MD;  Location: [facility] ENDOSCOPY;  Service: Endoscopy;  Laterality: N/A;  . ESOPHAGOGASTRODUODENOSCOPY (EGD) WITH PROPOFOL N/A 11/14/2016   Procedure: ESOPHAGOGASTRODUODENOSCOPY (EGD) WITH PROPOFOL;  Surgeon: Manya Silvas, MD;  Location: [facility] ENDOSCOPY;  Service: Endoscopy;  Laterality: N/A;  . JOINT REPLACEMENT Right    hip  . KNEE SURGERY     right  . SHOULDER ARTHROSCOPY WITH OPEN ROTATOR CUFF REPAIR Right 08/20/2017   Procedure: SHOULDER ARTHROSCOPY WITH OPEN ROTATOR CUFF REPAIR;  Surgeon: Corky Mull, MD;  Location: [facility] ORS;  Service: Orthopedics;  Laterality: Right;  . SHOULDER ARTHROSCOPY WITH SUBACROMIAL DECOMPRESSION, ROTATOR CUFF REPAIR AND BICEP TENDON REPAIR Left 05/10/2016   Procedure: SHOULDER ARTHROSCOPY WITH DEBTRIDEMENT, DECOMPRESSION, REPAIR OF MASSIVE ROTATOR CUFF TEAR AND BICEP TENODESIS;  Surgeon: Corky Mull, MD;  Location: [facility] ORS;  Service: Orthopedics;  Laterality: Left;  Massive cuff tear  . TOTAL HIP ARTHROPLASTY Right 07/01/2018   Procedure: TOTAL HIP ARTHROPLASTY - RIGHT;  Surgeon: Corky Mull, MD;  Location: [facility] ORS;  Service: Orthopedics;  Laterality: Right;  Marland Kitchen VIDEO BRONCHOSCOPY WITH ENDOBRONCHIAL NAVIGATION N/A 02/13/2019   Procedure: VIDEO BRONCHOSCOPY WITH ENDOBRONCHIAL NAVIGATION;  Surgeon: Ottie Glazier, MD;  Location: [facility] ORS;  Service: Thoracic;  Laterality: N/A;  . VIDEO BRONCHOSCOPY WITH ENDOBRONCHIAL NAVIGATION N/A 03/13/2019   Procedure: VIDEO BRONCHOSCOPY WITH ENDOBRONCHIAL NAVIGATION;  Surgeon: Ottie Glazier, MD;  Location: [facility] ORS;  Service: Thoracic;  Laterality: N/A;  . VIDEO BRONCHOSCOPY WITH ENDOBRONCHIAL ULTRASOUND N/A 02/13/2019   Procedure: VIDEO BRONCHOSCOPY WITH ENDOBRONCHIAL  ULTRASOUND;  Surgeon: Ottie Glazier, MD;  Location: [facility] ORS;  Service: Thoracic;  Laterality: N/A;  . VIDEO BRONCHOSCOPY WITH ENDOBRONCHIAL ULTRASOUND N/A 03/13/2019   Procedure: VIDEO BRONCHOSCOPY WITH ENDOBRONCHIAL ULTRASOUND;  Surgeon: Ottie Glazier, MD;  Location: [facility] ORS;  Service: Thoracic;  Laterality: N/A;    Allergies Penicillins and Lidocaine  Social History Social History   Tobacco Use  . Smoking status: Light Tobacco Smoker    Packs/day: 0.50    Years: 30.00    Pack years: 15.00    Types: Cigarettes    Start date: 02/26/1973    Last attempt to quit: 09/21/2018    Years since quitting: 0.6  . Smokeless tobacco: Former Systems developer    Quit date: 02/26/1998  . Tobacco comment: smokes 2-3 cig a week 02/12/19  Substance Use Topics  . Alcohol use: Yes    Alcohol/week: 20.0 standard drinks    Types: 20 Cans of beer per week    Comment: week   . Drug use: Yes    Comment: prescribed hydrocodone    Review of Systems Constitutional: Negative for fever. Cardiovascular: Negative for chest pain. Respiratory: Positive for shortness of breath Gastrointestinal: Negative for abdominal pain, vomiting and diarrhea. Musculoskeletal: Negative for back pain. Skin: Negative for rash. Neurological: Negative for headaches, focal weakness or numbness.  All systems negative/normal/unremarkable except as stated in the HPI  ____________________________________________  PHYSICAL EXAM:  VITAL SIGNS: ED Triage Vitals  Enc Vitals Group     BP      Pulse      Resp      Temp      Temp src      SpO2      Weight      Height      Head Circumference      Peak Flow      Pain Score      Pain Loc      Pain Edu?      Excl. in [place]?     Constitutional: Alert and oriented. Well appearing and in no distress. Eyes: Conjunctivae are normal. Normal extraocular movements. Cardiovascular: Normal rate, regular rhythm. No murmurs, rubs, or gallops. Respiratory: Normal respiratory effort without  tachypnea nor retractions. Breath sounds are clear and equal bilaterally. No wheezes/rales/rhonchi. Gastrointestinal: Soft and nontender. Normal bowel sounds Musculoskeletal: Nontender with normal range of motion in extremities. No lower extremity tenderness nor edema. Neurologic:  Normal speech and language. No gross focal neurologic deficits are appreciated.  Skin:  Skin is warm, dry and intact. No rash noted. Psychiatric: Mood and affect are normal. Speech and behavior are normal.  ____________________________________________  EKG: Interpreted by me.  Sinus tachycardia with a rate of 118 bpm, LVH with repolarization abnormality, leftward axis, normal QT  ____________________________________________  ED COURSE:  As part of my medical decision making, I reviewed the following data within the Center History obtained from family if available, nursing notes, old chart and ekg, as well as notes from prior ED visits. Patient presented for dyspnea, we will assess with labs and imaging as indicated at this time.   Procedures  Joseph Peters was evaluated in Emergency Department on 05/11/2019 for the symptoms described in the history of present illness. He was evaluated in the context of the global COVID-19 pandemic, which necessitated consideration that the patient might be at risk for infection with the SARS-CoV-2 virus that causes COVID-19. Institutional protocols and algorithms that pertain to the evaluation of patients at risk for COVID-19 are in a state of rapid change based on information released by regulatory bodies including the CDC and federal and state organizations. These policies and algorithms were followed during the patient's care in the ED.  ____________________________________________   LABS (pertinent positives/negatives)  Labs Reviewed  CBC WITH DIFFERENTIAL/PLATELET - Abnormal; Notable for the following components:      Result Value   WBC 12.1 (*)     Neutro Abs 9.4 (*)    All other components within normal limits  BASIC METABOLIC PANEL - Abnormal; Notable for the following components:   Chloride 97 (*)    Glucose, Bld 104 (*)    All other components within normal limits  BRAIN NATRIURETIC PEPTIDE - Abnormal; Notable for the following components:   B Natriuretic Peptide >4,500.0 (*)    All other components within normal limits  LACTIC ACID, PLASMA - Abnormal; Notable for the following components:   Lactic Acid, Venous 2.5 (*)    All other components within normal limits  TROPONIN I (HIGH SENSITIVITY) - Abnormal; Notable for the following components:   Troponin I (High Sensitivity) 44 (*)    All other components within normal limits  RESPIRATORY PANEL BY RT PCR (FLU A&B, COVID)  CULTURE, BLOOD (ROUTINE X 2)  CULTURE, BLOOD (ROUTINE X 2)  LACTIC ACID, PLASMA  TROPONIN I (HIGH SENSITIVITY)   CRITICAL CARE Performed by: Molli Barrows  E Forest Pruden   Total critical care time: 30 minutes  Critical care time was exclusive of separately billable procedures and treating other patients.  Critical care was necessary to treat or prevent imminent or life-threatening deterioration.  Critical care was time spent personally by me on the following activities: development of treatment plan with patient and/or surrogate as well as nursing, discussions with consultants, evaluation of patient's response to treatment, examination of patient, obtaining history from patient or surrogate, ordering and performing treatments and interventions, ordering and review of laboratory studies, ordering and review of radiographic studies, pulse oximetry and re-evaluation of patient's condition.  RADIOLOGY Images were viewed by me  Chest x-ray IMPRESSION:  Underlying fibrosis. Consolidation right upper lobe with volume  loss, stable. Suspect patchy pneumonia elsewhere, stable from recent  studies. Small pleural effusion bilaterally. Stable cardiac  silhouette.   ____________________________________________   DIFFERENTIAL DIAGNOSIS   CHF, COPD, pneumonia, lung cancer, COVID-19  FINAL ASSESSMENT AND PLAN  Dyspnea, CHF, possible recurrent or persistent pneumonia   Plan: The patient had presented for worsening dyspnea. Patient's labs did reveal a slightly elevated lactic acid level, elevated BNP, chronic elevated troponin and a mildly elevated white count. Patient's imaging revealed fibrosis with stable right upper lobe consolidation, suspected patchy pneumonia.  I will add IV Levaquin.  I have ordered IV Lasix for him as well.  Patient does not appear to be septic, this appears to be a combination of acute on chronic pneumonia with CHF.   Laurence Aly, MD    Note: This note was generated in part or whole with voice recognition software. Voice recognition is usually quite accurate but there are transcription errors that can and very often do occur. I apologize for any typographical errors that were not detected and corrected.     Earleen Newport, MD 05/11/19 1304

## 2019-05-11 NOTE — ED Provider Notes (Signed)
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  We will add broad-spectrum antibiotic coverage, currently Levaquin.  I have ordered sputum cultures for him.  He will be seen by ID in consult.   Earleen Newport, MD 05/11/19 1344

## 2019-05-11 NOTE — ED Notes (Signed)
ED TO INPATIENT HANDOFF REPORT  ED Nurse Name and Phone #: [phone]  S Name/Age/Gender Joseph Peters 64 y.o. male Room/Bed: ED24A/ED24A  Code Status   Code Status: Full Code  Home/SNF/Other Home Patient oriented to: self, place, time and situation Is this baseline? Yes   Triage Complete: Triage complete  Chief Complaint HCAP (healthcare-associated pneumonia) [J18.9]  Triage Note Pt arrived via [facility] from home with difficulty breathing. Pt has been admitted for fluid on his lungs several times.    Allergies Allergies  Allergen Reactions  . Penicillins Swelling, Rash and Other (See Comments)    Swelling in throat  Did it involve swelling of the face/tongue/throat, SOB, or low BP? yes Did it involve sudden or severe rash/hives, skin peeling, or any reaction on the inside of your mouth or nose? no Did you need to seek medical attention at a hospital or doctor's office? Yes When did it last happen?years  If all above answers are "NO", may proceed with cephalosporin use.    . Lidocaine Other (See Comments)    Unsure  At dentist's office    Level of Care/Admitting Diagnosis ED Disposition    ED Disposition Condition [place]: [place] [[id number]]  Level of Care: Med-Surg [16]  Covid Evaluation: Confirmed COVID Negative  Date Laboratory Confirmed COVID Negative: 05/11/2019  Diagnosis: HCAP (healthcare-associated pneumonia) [381017]  Admitting Physician: Ivor Costa [[id number]]  Attending Physician: Ivor Costa [[id number]]  Estimated length of stay: past midnight tomorrow  Certification:: I certify this patient will need inpatient services for at least 2 midnights       B Medical/Surgery History Past Medical History:  Diagnosis Date  . AAA (abdominal aortic aneurysm) without rupture ([place])   . Abdominal aneurysm ([place]) 2015   being followed but not large enough to surgically treat  . Alcoholism ([place])   . Anxiety   .  Arthritis   . Asthma   . Benign hypertension 07/16/2014  . Chest pain on exertion 07/16/2014  . Chronic pain syndrome   . Coccydynia 02/08/2012  . COPD (chronic obstructive pulmonary disease) ([place])    patient smokes  . Coronary artery disease   . 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 Depression   . 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 Dyspnea   . Frequent falls   . GERD (gastroesophageal reflux disease)   . Hyperlipidemia   . IBS (irritable bowel syndrome)   . Myocardial infarction ([place]) 2010  . Neuropathy   . Pneumonia   . Prostatitis   . Restless leg syndrome   . Sciatica   . Spinal stenosis of lumbar region   . Stroke ([place]) 2015   no residual symptoms  . Tremor, essential    Past Surgical History:  Procedure Laterality Date  . CARDIAC CATHETERIZATION  2012  . COLONOSCOPY WITH PROPOFOL N/A 11/14/2016   Procedure: COLONOSCOPY WITH PROPOFOL;  Surgeon: Manya Silvas, MD;  Location: [facility] ENDOSCOPY;  Service: Endoscopy;  Laterality: N/A;  . ESOPHAGOGASTRODUODENOSCOPY (EGD) WITH PROPOFOL N/A 11/14/2016   Procedure: ESOPHAGOGASTRODUODENOSCOPY (EGD) WITH PROPOFOL;  Surgeon: Manya Silvas, MD;  Location: [facility] ENDOSCOPY;  Service: Endoscopy;  Laterality: N/A;  . JOINT REPLACEMENT Right    hip  . KNEE SURGERY     right  . SHOULDER ARTHROSCOPY WITH OPEN ROTATOR CUFF REPAIR Right 08/20/2017   Procedure: SHOULDER ARTHROSCOPY WITH OPEN ROTATOR CUFF REPAIR;  Surgeon: Corky Mull, MD;  Location: [facility] ORS;  Service: Orthopedics;  Laterality: Right;  . SHOULDER ARTHROSCOPY WITH SUBACROMIAL DECOMPRESSION, ROTATOR CUFF REPAIR AND BICEP TENDON REPAIR Left 05/10/2016   Procedure: SHOULDER ARTHROSCOPY WITH DEBTRIDEMENT, DECOMPRESSION, REPAIR OF MASSIVE ROTATOR CUFF TEAR AND BICEP TENODESIS;  Surgeon: Corky Mull, MD;  Location: [facility] ORS;  Service: Orthopedics;  Laterality: Left;  Massive cuff tear  . TOTAL HIP ARTHROPLASTY Right 07/01/2018   Procedure:  TOTAL HIP ARTHROPLASTY - RIGHT;  Surgeon: Corky Mull, MD;  Location: [facility] ORS;  Service: Orthopedics;  Laterality: Right;  Marland Kitchen VIDEO BRONCHOSCOPY WITH ENDOBRONCHIAL NAVIGATION N/A 02/13/2019   Procedure: VIDEO BRONCHOSCOPY WITH ENDOBRONCHIAL NAVIGATION;  Surgeon: Ottie Glazier, MD;  Location: [facility] ORS;  Service: Thoracic;  Laterality: N/A;  . VIDEO BRONCHOSCOPY WITH ENDOBRONCHIAL NAVIGATION N/A 03/13/2019   Procedure: VIDEO BRONCHOSCOPY WITH ENDOBRONCHIAL NAVIGATION;  Surgeon: Ottie Glazier, MD;  Location: [facility] ORS;  Service: Thoracic;  Laterality: N/A;  . VIDEO BRONCHOSCOPY WITH ENDOBRONCHIAL ULTRASOUND N/A 02/13/2019   Procedure: VIDEO BRONCHOSCOPY WITH ENDOBRONCHIAL ULTRASOUND;  Surgeon: Ottie Glazier, MD;  Location: [facility] ORS;  Service: Thoracic;  Laterality: N/A;  . VIDEO BRONCHOSCOPY WITH ENDOBRONCHIAL ULTRASOUND N/A 03/13/2019   Procedure: VIDEO BRONCHOSCOPY WITH ENDOBRONCHIAL ULTRASOUND;  Surgeon: Ottie Glazier, MD;  Location: [facility] ORS;  Service: Thoracic;  Laterality: N/A;     A IV Location/Drains/Wounds Patient Lines/Drains/Airways Status   Active Line/Drains/Airways    Name:   Placement date:   Placement time:   Site:   Days:   Peripheral IV 05/11/19 Right Antecubital   05/11/19    1136    Antecubital   less than 1   Wound / Incision (Open or Dehisced) 05/01/19 Other (Comment) Arm Right   05/01/19    1749    Arm   10          Intake/Output Last 24 hours No intake or output data in the 24 hours ending 05/11/19 1726  Labs/Imaging Results for orders placed or performed during the hospital encounter of 05/11/19 (from the past 48 hour(s))  Respiratory Panel by RT PCR (Flu A&B, Covid) - Nasopharyngeal Swab     Status: None   Collection Time: 05/11/19 11:24 AM   Specimen: Nasopharyngeal Swab  Result Value Ref Range   SARS Coronavirus 2 by RT PCR NEGATIVE NEGATIVE    Comment: (NOTE) SARS-CoV-2 target nucleic acids are NOT DETECTED. The SARS-CoV-2 RNA is generally  detectable in upper respiratoy specimens during the acute phase of infection. The lowest concentration of SARS-CoV-2 viral copies this assay can detect is 131 copies/mL. A negative result does not preclude SARS-Cov-2 infection and should not be used as the sole basis for treatment or other patient management decisions. A negative result may occur with  improper specimen collection/handling, submission of specimen other than nasopharyngeal swab, presence of viral mutation(s) within the areas targeted by this assay, and inadequate number of viral copies (<131 copies/mL). A negative result must be combined with clinical observations, patient history, and epidemiological information. The expected result is Negative. Fact Sheet for Patients:  PinkCheek.be Fact Sheet for Healthcare Providers:  GravelBags.it This test is not yet ap proved or cleared by the [place] FDA and  has been authorized for detection and/or diagnosis of SARS-CoV-2 by FDA under an Emergency Use Authorization (EUA). This EUA will remain  in effect (meaning this test can be used) for the duration of the COVID-19 declaration under Section 564(b)(1) of the Act, 21 U.S.C. section 360bbb-3(b)(1), unless the authorization is terminated or revoked sooner.  Influenza A by PCR NEGATIVE NEGATIVE   Influenza B by PCR NEGATIVE NEGATIVE    Comment: (NOTE) The Xpert Xpress SARS-CoV-2/FLU/RSV assay is intended as an aid in  the diagnosis of influenza from Nasopharyngeal swab specimens and  should not be used as a sole basis for treatment. Nasal washings and  aspirates are unacceptable for Xpert Xpress SARS-CoV-2/FLU/RSV  testing. Fact Sheet for Patients: PinkCheek.be Fact Sheet for Healthcare Providers: GravelBags.it This test is not yet approved or cleared by the [place] FDA and  has been authorized for  detection and/or diagnosis of SARS-CoV-2 by  FDA under an Emergency Use Authorization (EUA). This EUA will remain  in effect (meaning this test can be used) for the duration of the  Covid-19 declaration under Section 564(b)(1) of the Act, 21  U.S.C. section 360bbb-3(b)(1), unless the authorization is  terminated or revoked. Performed at [facility], [address] [place], [place] [address]   CBC with Differential     Status: Abnormal   Collection Time: 05/11/19 11:34 AM  Result Value Ref Range   WBC 12.1 (H) 4.0 - 10.5 K/uL   RBC 4.99 4.22 - 5.81 MIL/uL   Hemoglobin 14.3 13.0 - 17.0 g/dL   HCT 44.7 39.0 - 52.0 %   MCV 89.6 80.0 - 100.0 fL   MCH 28.7 26.0 - 34.0 pg   MCHC 32.0 30.0 - 36.0 g/dL   RDW 14.3 11.5 - 15.5 %   Platelets 277 150 - 400 K/uL   nRBC 0.0 0.0 - 0.2 %   Neutrophils Relative % 77 %   Neutro Abs 9.4 (H) 1.7 - 7.7 K/uL   Lymphocytes Relative 12 %   Lymphs Abs 1.4 0.7 - 4.0 K/uL   Monocytes Relative 8 %   Monocytes Absolute 0.9 0.1 - 1.0 K/uL   Eosinophils Relative 2 %   Eosinophils Absolute 0.3 0.0 - 0.5 K/uL   Basophils Relative 1 %   Basophils Absolute 0.1 0.0 - 0.1 K/uL   Immature Granulocytes 0 %   Abs Immature Granulocytes 0.05 0.00 - 0.07 K/uL    Comment: Performed at [facility], [address], [place], [place] [address]  Basic metabolic panel     Status: Abnormal   Collection Time: 05/11/19 11:34 AM  Result Value Ref Range   Sodium 135 135 - 145 mmol/L   Potassium 3.7 3.5 - 5.1 mmol/L   Chloride 97 (L) 98 - 111 mmol/L   CO2 23 22 - 32 mmol/L   Glucose, Bld 104 (H) 70 - 99 mg/dL    Comment: Glucose reference range applies only to samples taken after fasting for at least 8 hours.   BUN 9 8 - 23 mg/dL   Creatinine, Ser 1.09 0.61 - 1.24 mg/dL   Calcium 9.4 8.9 - 10.3 mg/dL   GFR calc non Af Amer >60 >60 mL/min   GFR calc Af Amer >60 >60 mL/min   Anion gap 15 5 - 15    Comment: Performed at [facility], [address] [place], [place] [address]  Brain natriuretic peptide     Status: Abnormal   Collection Time: 05/11/19 11:34 AM  Result Value Ref Range   B Natriuretic Peptide >4,500.0 (H) 0.0 - 100.0 pg/mL    Comment: Performed at [facility], [address] [place], [place] [address]  Troponin I (High Sensitivity)     Status: Abnormal   Collection Time: 05/11/19 11:34 AM  Result Value Ref Range  Troponin I (High Sensitivity) 44 (H) <18 ng/L    Comment: (NOTE) Elevated high sensitivity troponin I (hsTnI) values and significant  changes across serial measurements may suggest ACS but many other  chronic and acute conditions are known to elevate hsTnI results.  Refer to the "Links" section for chest pain algorithms and additional  guidance. Performed at [facility], [place], [place] [address]   Lactic acid, plasma     Status: Abnormal   Collection Time: 05/11/19 11:34 AM  Result Value Ref Range   Lactic Acid, Venous 2.5 (HH) 0.5 - 1.9 mmol/L    Comment: CRITICAL RESULT CALLED TO, READ BACK BY AND VERIFIED WITH Heliodoro Domagalski ON 05/11/19 AT 1244 BY JAG Performed at [facility], [address] [place], [place] [address]   Lactic acid, plasma     Status: Abnormal   Collection Time: 05/11/19  1:30 PM  Result Value Ref Range   Lactic Acid, Venous 3.8 (HH) 0.5 - 1.9 mmol/L    Comment: CRITICAL RESULT CALLED TO, READ BACK BY AND VERIFIED WITH CHRISSY BRAND ON 05/11/19 AT 1358 QSD Performed at [facility], [place], [place] [address]   Troponin I (High Sensitivity)     Status: Abnormal   Collection Time: 05/11/19  1:30 PM  Result Value Ref Range   Troponin I (High Sensitivity) 37 (H) <18 ng/L    Comment: (NOTE) Elevated high sensitivity troponin I (hsTnI) values and significant  changes across serial measurements may suggest ACS but many other  chronic and acute conditions are known to elevate hsTnI results.   Refer to the "Links" section for chest pain algorithms and additional  guidance. Performed at [facility], [address] [place], [place] [address]    CT ANGIO CHEST PE W OR WO CONTRAST  Result Date: 05/11/2019 CLINICAL DATA:  Shortness of breath and cough.  Worsening dyspnea. EXAM: CT ANGIOGRAPHY CHEST WITH CONTRAST TECHNIQUE: Multidetector CT imaging of the chest was performed using the standard protocol during bolus administration of intravenous contrast. Multiplanar CT image reconstructions and MIPs were obtained to evaluate the vascular anatomy. CONTRAST:  52mL OMNIPAQUE IOHEXOL 350 MG/ML SOLN COMPARISON:  04/28/2019 FINDINGS: Cardiovascular: The heart size is normal. No substantial pericardial effusion. Coronary artery calcification is evident. Ascending thoracic aorta measures 4.3 cm diameter. Atherosclerotic calcification is noted in the wall of the thoracic aorta. No filling defect within the opacified pulmonary arteries to suggest the presence of an acute pulmonary embolus. Mediastinum/Nodes: Scattered small mediastinal lymph nodes again noted. No left hilar adenopathy. Stable soft tissue attenuation in the right hilar region. The esophagus has normal imaging features. There is no axillary lymphadenopathy. Lungs/Pleura: Centrilobular and paraseptal emphysema evident. The right upper lobe collapse/consolidative opacity is stable with cavitary component visible on 28/7. Similar architectural distortion, scarring, and airspace disease in the right lower lobe, lingula, and left lower lobe. Small bilateral pleural effusions again noted. Upper Abdomen: Mild fullness noted left upper pole renal collecting system although renal pelvis and entire left kidney is not been visualized. Musculoskeletal: No worrisome lytic or sclerotic osseous abnormality. Mild inferior endplate compression deformity of a midthoracic vertebral body is stable. Review of the MIP images confirms the above findings.  IMPRESSION: 1. No CT evidence for acute pulmonary embolus. 2. Similar appearance of the bilateral airspace disease with confluent consolidative change in the right lung apex demonstrating small cavitary component. Imaging features remain suspicious for multifocal pneumonia although neoplasm cannot be excluded. 3.  Persistent small bilateral pleural effusions. 4. Ascending thoracic aorta measures 4.3 cm diameter. Recommend annual imaging followup by CTA or MRA. This recommendation follows 2010 ACCF/AHA/AATS/ACR/ASA/SCA/SCAI/SIR/STS/SVM Guidelines for the Diagnosis and Management of Patients with Thoracic Aortic Disease. Circulation. 2010; 121: I948-N462. Aortic aneurysm NOS (ICD10-I71.9) 5.  Emphysema (ICD10-J43.9) and Aortic Atherosclerosis (ICD10-170.0) Electronically Signed   By: Misty Stanley M.D.   On: 05/11/2019 16:31   DG Chest Port 1 View  Result Date: 05/11/2019 CLINICAL DATA:  Shortness of breath EXAM: PORTABLE CHEST 1 VIEW COMPARISON:  May 02, 2019 chest radiograph as well as chest radiograph and chest CT April 28, 2019 FINDINGS: There remains underlying fibrosis bilaterally. There is consolidation with volume loss in the right upper lobe, similar to recent studies. There is patchy ill-defined opacity throughout the left mid and lower lung zones and right base, likely foci of multifocal pneumonia superimposed on fibrosis. There is a fairly small pleural effusion on each side. No new opacity evident compared to recent studies. Heart size is within normal limits. There is distortion of pulmonary vascularity on the right. The pulmonary vascularity on the left is grossly normal. No adenopathy is appreciable by radiography. No focal bone lesions. IMPRESSION: Underlying fibrosis. Consolidation right upper lobe with volume loss, stable. Suspect patchy pneumonia elsewhere, stable from recent studies. Small pleural effusion bilaterally. Stable cardiac silhouette. Electronically Signed   By: Lowella Grip III  M.D.   On: 05/11/2019 11:40    Pending Labs Unresulted Labs (From admission, onward)    Start     Ordered   05/12/19 0500  Creatinine, serum  Tomorrow morning,   STAT     05/11/19 1559   05/11/19 1536  Legionella Pneumophila Serogp 1 Ur Ag  Once,   STAT     05/11/19 1535   05/11/19 1536  Strep pneumoniae urinary antigen  Once,   STAT     05/11/19 1535   05/11/19 1534  HIV Antibody (routine testing w rflx)  (HIV Antibody (Routine testing w reflex) panel)  Once,   STAT     05/11/19 1535   05/11/19 1344  MRSA PCR Screening  Once,   STAT     05/11/19 1343   05/11/19 1343  Expectorated sputum assessment w rflx to resp cult  ONCE - STAT,   STAT     05/11/19 1343   05/11/19 1343  Acid Fast Smear (AFB)  (AFB smear + Culture w reflexed sensitivities panel)  Once,   STAT     05/11/19 1343   05/11/19 1343  Acid Fast Culture with reflexed sensitivities  (AFB smear + Culture w reflexed sensitivities panel)  Once,   STAT     05/11/19 1343   05/11/19 1115  Blood culture (routine x 2)  BLOOD CULTURE X 2,   STAT     05/11/19 1115   Signed and Held  Urine Drug Screen, Qualitative ([place] only)  Once,   R     Signed and Held   Signed and Held  Lactic acid, plasma  STAT Now then every 2 hours,   STAT     Signed and Held   Signed and Held  Procalcitonin  ONCE - STAT,   R     Signed and Held          Vitals/Pain Today's Vitals   05/11/19 1430 05/11/19 1500 05/11/19 1530 05/11/19 1600  BP: 115/72 (!) 141/87 118/73 105/79  Pulse: 95 99 99 94  Resp:      Temp:  TempSrc:      SpO2: 97% 100% 98% 100%  Weight:      Height:      PainSc:        Isolation Precautions No active isolations  Medications Medications  ipratropium-albuterol (DUONEB) 0.5-2.5 (3) MG/3ML nebulizer solution 3 mL (3 mLs Nebulization Given 05/11/19 1436)  albuterol (PROVENTIL) (2.5 MG/3ML) 0.083% nebulizer solution 2.5 mg (has no administration in time range)  dextromethorphan-guaiFENesin (MUCINEX DM) 30-600 MG per 12  hr tablet 1 tablet (has no administration in time range)  methylPREDNISolone sodium succinate (SOLU-MEDROL) 40 mg/mL injection 40 mg (has no administration in time range)  heparin injection 5,000 Units (5,000 Units Subcutaneous Given 05/11/19 1557)  feeding supplement (ENSURE ENLIVE) (ENSURE ENLIVE) liquid 237 mL (237 mLs Oral Not Given 05/11/19 1548)  ceFEPIme (MAXIPIME) 2 g in sodium chloride 0.9 % 100 mL IVPB (has no administration in time range)  vancomycin (VANCOCIN) IVPB 1000 mg/200 mL premix (has no administration in time range)  vancomycin (VANCOREADY) IVPB 750 mg/150 mL (has no administration in time range)  ipratropium-albuterol (DUONEB) 0.5-2.5 (3) MG/3ML nebulizer solution 3 mL (3 mLs Nebulization Given 05/11/19 1140)  ipratropium-albuterol (DUONEB) 0.5-2.5 (3) MG/3ML nebulizer solution 3 mL (3 mLs Nebulization Given 05/11/19 1140)  ipratropium-albuterol (DUONEB) 0.5-2.5 (3) MG/3ML nebulizer solution 3 mL (3 mLs Nebulization Given 05/11/19 1140)  methylPREDNISolone sodium succinate (SOLU-MEDROL) 125 mg/2 mL injection 125 mg (125 mg Intravenous Given 05/11/19 1141)  furosemide (LASIX) injection 40 mg (40 mg Intravenous Given 05/11/19 1306)  HYDROmorphone (DILAUDID) injection 0.5 mg (0.5 mg Intravenous Given 05/11/19 1306)  levofloxacin (LEVAQUIN) IVPB 750 mg (0 mg Intravenous Stopped 05/11/19 1500)  alum & mag hydroxide-simeth (MAALOX/MYLANTA) 200-200-20 MG/5ML suspension 30 mL (30 mLs Oral Given 05/11/19 1436)  iohexol (OMNIPAQUE) 350 MG/ML injection 75 mL (75 mLs Intravenous Contrast Given 05/11/19 1610)    Mobility walks Low fall risk   Focused Assessments    R Recommendations: See Admitting Provider Note  Report given to:

## 2019-05-11 NOTE — ED Provider Notes (Signed)
Patient will not receive sepsis fluids due to congestive heart failure   Earleen Newport, MD 05/11/19 1400

## 2019-05-11 NOTE — H&P (Signed)
History and Physical    Joseph Peters ZOX:[id number] DOB: [birth year] DOA: 05/11/2019  Referring MD/NP/PA:   PCP: Barbaraann Boys, MD   Patient coming from:  The patient is coming from home.  At baseline, pt is independent for most of ADL.        Chief Complaint: SOB  HPI: Joseph Peters is a 64 y.o. male with medical history significant of hypertension, hyperlipidemia, COPD on 3 L nasal cannula oxygen at home, asthma, stroke, GERD, depression, anxiety, RLS, CAD, sCHF with EF 35-40%, IBS, alcohol abuse, tobacco abuse, AAA, BPH, MAC infection, who presents with shortness of breath.  Patient was recently hospitalized from 3/2-3/7 due to CHF exacerbation, COPD exacerbation and HCAP.  Patient states that after he went home, he continues to have shortness of breath.  Continues to have cough, but no fever or chills.  He coughs up streaks of blood occasionally.  He also reports chest pain, which is pressure-like, intermittent, nonradiating, moderate.  He also reports nausea, vomited once in the early morning, currently no nausea or vomiting.  He also has intermittent mild loose stool bowel movement.  No abdominal pain.  No symptoms of UTI.  Patient states that he stopped drinking alcohol 4 months ago.  He also stopped smoking.  ED Course: pt was found to have WBC 12.1, BNP> 4500, troponin 44, lactic acid 2.5, electrolytes renal function okay, temperature normal, blood pressure 163/110, tachycardia, tachypnea, oxygen sat 93% on 4 L nasal cannula oxygen, chest x-ray showed fibrotic change and patchy infiltration right upper lobe.  Patient is admitted to [place] bed as inpatient.  Review of Systems:   General: no fevers, chills, no body weight gain, has poor appetite, has fatigue HEENT: no blurry vision, hearing changes or sore throat Respiratory: has dyspnea, coughing, no wheezing CV: has chest pain, no palpitations GI: has nausea, vomiting, diarrhea, no constipation, abdominal pain,   GU: no  dysuria, burning on urination, increased urinary frequency, hematuria  Ext: no leg edema Neuro: no unilateral weakness, numbness, or tingling, no vision change or hearing loss Skin: no rash, no skin tear. MSK: No muscle spasm, no deformity, no limitation of range of movement in spin Heme: No easy bruising.  Travel history: No recent long distant travel.  Allergy:  Allergies  Allergen Reactions  . Penicillins Swelling, Rash and Other (See Comments)    Swelling in throat  Did it involve swelling of the face/tongue/throat, SOB, or low BP? yes Did it involve sudden or severe rash/hives, skin peeling, or any reaction on the inside of your mouth or nose? no Did you need to seek medical attention at a hospital or doctor's office? Yes When did it last happen?years  If all above answers are "NO", may proceed with cephalosporin use.    . Lidocaine Other (See Comments)    Unsure  At dentist's office    Past Medical History:  Diagnosis Date  . AAA (abdominal aortic aneurysm) without rupture ([place])   . Abdominal aneurysm ([place]) 2015   being followed but not large enough to surgically treat  . Alcoholism ([place])   . Anxiety   . Arthritis   . Asthma   . Benign hypertension 07/16/2014  . Chest pain on exertion 07/16/2014  . Chronic pain syndrome   . Coccydynia 02/08/2012  . COPD (chronic obstructive pulmonary disease) ([place])    patient smokes  . Coronary artery disease   . 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 Depression   .  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 Dyspnea   . Frequent falls   . GERD (gastroesophageal reflux disease)   . Hyperlipidemia   . IBS (irritable bowel syndrome)   . Myocardial infarction ([place]) 2010  . Neuropathy   . Pneumonia   . Prostatitis   . Restless leg syndrome   . Sciatica   . Spinal stenosis of lumbar region   . Stroke ([place]) 2015   no residual symptoms  . Tremor, essential     Past Surgical History:    Procedure Laterality Date  . CARDIAC CATHETERIZATION  2012  . COLONOSCOPY WITH PROPOFOL N/A 11/14/2016   Procedure: COLONOSCOPY WITH PROPOFOL;  Surgeon: Manya Silvas, MD;  Location: [facility] ENDOSCOPY;  Service: Endoscopy;  Laterality: N/A;  . ESOPHAGOGASTRODUODENOSCOPY (EGD) WITH PROPOFOL N/A 11/14/2016   Procedure: ESOPHAGOGASTRODUODENOSCOPY (EGD) WITH PROPOFOL;  Surgeon: Manya Silvas, MD;  Location: [facility] ENDOSCOPY;  Service: Endoscopy;  Laterality: N/A;  . JOINT REPLACEMENT Right    hip  . KNEE SURGERY     right  . SHOULDER ARTHROSCOPY WITH OPEN ROTATOR CUFF REPAIR Right 08/20/2017   Procedure: SHOULDER ARTHROSCOPY WITH OPEN ROTATOR CUFF REPAIR;  Surgeon: Corky Mull, MD;  Location: [facility] ORS;  Service: Orthopedics;  Laterality: Right;  . SHOULDER ARTHROSCOPY WITH SUBACROMIAL DECOMPRESSION, ROTATOR CUFF REPAIR AND BICEP TENDON REPAIR Left 05/10/2016   Procedure: SHOULDER ARTHROSCOPY WITH DEBTRIDEMENT, DECOMPRESSION, REPAIR OF MASSIVE ROTATOR CUFF TEAR AND BICEP TENODESIS;  Surgeon: Corky Mull, MD;  Location: [facility] ORS;  Service: Orthopedics;  Laterality: Left;  Massive cuff tear  . TOTAL HIP ARTHROPLASTY Right 07/01/2018   Procedure: TOTAL HIP ARTHROPLASTY - RIGHT;  Surgeon: Corky Mull, MD;  Location: [facility] ORS;  Service: Orthopedics;  Laterality: Right;  Marland Kitchen VIDEO BRONCHOSCOPY WITH ENDOBRONCHIAL NAVIGATION N/A 02/13/2019   Procedure: VIDEO BRONCHOSCOPY WITH ENDOBRONCHIAL NAVIGATION;  Surgeon: Ottie Glazier, MD;  Location: [facility] ORS;  Service: Thoracic;  Laterality: N/A;  . VIDEO BRONCHOSCOPY WITH ENDOBRONCHIAL NAVIGATION N/A 03/13/2019   Procedure: VIDEO BRONCHOSCOPY WITH ENDOBRONCHIAL NAVIGATION;  Surgeon: Ottie Glazier, MD;  Location: [facility] ORS;  Service: Thoracic;  Laterality: N/A;  . VIDEO BRONCHOSCOPY WITH ENDOBRONCHIAL ULTRASOUND N/A 02/13/2019   Procedure: VIDEO BRONCHOSCOPY WITH ENDOBRONCHIAL ULTRASOUND;  Surgeon: Ottie Glazier, MD;  Location: [facility] ORS;  Service: Thoracic;   Laterality: N/A;  . VIDEO BRONCHOSCOPY WITH ENDOBRONCHIAL ULTRASOUND N/A 03/13/2019   Procedure: VIDEO BRONCHOSCOPY WITH ENDOBRONCHIAL ULTRASOUND;  Surgeon: Ottie Glazier, MD;  Location: [facility] ORS;  Service: Thoracic;  Laterality: N/A;    Social History:  reports that he has been smoking cigarettes. He started smoking about 46 years ago. He has a 15.00 pack-year smoking history. He quit smokeless tobacco use about 21 years ago. He reports current alcohol use of about 20.0 standard drinks of alcohol per week. He reports current drug use.  Family History:  Family History  Problem Relation Age of Onset  . Cancer Mother   . COPD Father 63       black lung     Prior to Admission medications   Medication Sig Start Date End Date Taking? Authorizing Provider  aspirin EC 81 MG EC tablet Take 1 tablet (81 mg total) by mouth daily. 05/04/19  Yes Nolberto Hanlon, MD  DALIRESP 250 MCG TABS Take 250 mcg by mouth at bedtime. 10/17/18  Yes Ojie, Jude, MD  ethambutol (MYAMBUTOL) 400 MG tablet Take 2 tablets (800 mg total) by mouth daily. 04/11/19  Yes Wyvonnia Dusky, MD  finasteride (PROSCAR) 5  MG tablet Take 1 tablet (5 mg total) by mouth daily. 03/02/19  Yes Festus Aloe, MD  FLUoxetine (PROZAC) 20 MG capsule Take 20 mg by mouth daily.    Yes [provider]  Fluticasone-Umeclidin-Vilant (TRELEGY ELLIPTA) 100-62.5-25 MCG/INH AEPB Inhale 2 puffs into the lungs at bedtime.  08/18/18  Yes [provider]  folic acid (FOLVITE) 1 MG tablet Take 1 tablet (1 mg total) by mouth daily. 05/04/19  Yes Nolberto Hanlon, MD  furosemide (LASIX) 40 MG tablet Take 1 tablet (40 mg total) by mouth daily. 05/03/19  Yes Nolberto Hanlon, MD  gabapentin (NEURONTIN) 300 MG capsule Take 900 mg by mouth at bedtime.    Yes [provider]  metoprolol succinate (TOPROL-XL) 25 MG 24 hr tablet Take 0.5 tablets (12.5 mg total) by mouth daily. 05/03/19 06/02/19 Yes Nolberto Hanlon, MD  mirtazapine (REMERON) 7.5 MG tablet Take  1 tablet (7.5 mg total) by mouth at bedtime. 04/23/19 05/23/19 Yes Wyvonnia Dusky, MD  montelukast (SINGULAIR) 10 MG tablet Take 10 mg by mouth at bedtime.   Yes [provider]  Multiple Vitamin (MULTIVITAMIN WITH MINERALS) TABS tablet Take 1 tablet by mouth daily. 05/04/19  Yes Nolberto Hanlon, MD  nicotine (NICODERM CQ - DOSED IN MG/24 HOURS) 21 mg/24hr patch Place 1 patch (21 mg total) onto the skin daily. 05/03/19  Yes Nolberto Hanlon, MD  omeprazole (PRILOSEC) 20 MG capsule Take 20 mg by mouth daily.   Yes [provider]  rifampin (RIFADIN) 300 MG capsule Take 2 capsules (600 mg total) by mouth daily. 04/12/19 05/12/19 Yes Wyvonnia Dusky, MD  rosuvastatin (CRESTOR) 5 MG tablet Take 5 mg by mouth at bedtime.   Yes [provider]  tamsulosin (FLOMAX) 0.4 MG CAPS capsule Take 1 capsule (0.4 mg total) by mouth daily after supper. 12/22/18  Yes Festus Aloe, MD  thiamine 100 MG tablet Take 1 tablet (100 mg total) by mouth daily. 05/04/19  Yes Nolberto Hanlon, MD  albuterol (PROVENTIL HFA;VENTOLIN HFA) 108 (90 Base) MCG/ACT inhaler Inhale 2 puffs into the lungs every 4 (four) hours as needed for wheezing or shortness of breath.     [provider]  Oxycodone HCl 10 MG TABS Take 10 mg by mouth 4 (four) times daily as needed for pain. 09/29/18   [provider]    Physical Exam: Vitals:   05/11/19 1200 05/11/19 1230 05/11/19 1300 05/11/19 1330  BP: (!) 139/91 122/80 (!) 147/98 (!) 110/93  Pulse: (!) 107  (!) 131 (!) 105  Resp: (!) 26 (!) 26  (!) 23  Temp:      TempSrc:      SpO2: 100%  (!) 80% 92%  Weight:      Height:       General: Not in acute distress. Very thin body habitus HEENT:       Eyes: PERRL, EOMI, no scleral icterus.       ENT: No discharge from the ears and nose, no pharynx injection, no tonsillar enlargement.        Neck: positive JVD, no bruit, no mass felt. Heme: No neck lymph node enlargement. Cardiac: S1/S2, RRR, No murmurs, No  gallops or rubs. Respiratory: Coarse breathing sound bilaterally GI: Soft, nondistended, nontender, no rebound pain, no organomegaly, BS present. GU: No hematuria Ext: No pitting leg edema bilaterally. 2+DP/PT pulse bilaterally. Musculoskeletal: No joint deformities, No joint redness or warmth, no limitation of ROM in spin. Skin: No rashes.  Neuro: Alert, oriented X3, cranial  nerves II-XII grossly intact, moves all extremities normally. Psych: Patient is not psychotic, no suicidal or hemocidal ideation.  Labs on Admission: I have personally reviewed following labs and imaging studies  CBC: Recent Labs  Lab 05/11/19 1134  WBC 12.1*  NEUTROABS 9.4*  HGB 14.3  HCT 44.7  MCV 89.6  PLT 130   Basic Metabolic Panel: Recent Labs  Lab 05/11/19 1134  NA 135  K 3.7  CL 97*  CO2 23  GLUCOSE 104*  BUN 9  CREATININE 1.09  CALCIUM 9.4   GFR: Estimated Creatinine Clearance: 45.5 mL/min (by C-G formula based on SCr of 1.09 mg/dL). Liver Function Tests: No results for input(s): AST, ALT, ALKPHOS, BILITOT, PROT, ALBUMIN in the last 168 hours. No results for input(s): LIPASE, AMYLASE in the last 168 hours. No results for input(s): AMMONIA in the last 168 hours. Coagulation Profile: No results for input(s): INR, PROTIME in the last 168 hours. Cardiac Enzymes: No results for input(s): CKTOTAL, CKMB, CKMBINDEX, TROPONINI in the last 168 hours. BNP (last 3 results) No results for input(s): PROBNP in the last 8760 hours. HbA1C: No results for input(s): HGBA1C in the last 72 hours. CBG: No results for input(s): GLUCAP in the last 168 hours. Lipid Profile: No results for input(s): CHOL, HDL, LDLCALC, TRIG, CHOLHDL, LDLDIRECT in the last 72 hours. Thyroid Function Tests: No results for input(s): TSH, T4TOTAL, FREET4, T3FREE, THYROIDAB in the last 72 hours. Anemia Panel: No results for input(s): VITAMINB12, FOLATE, FERRITIN, TIBC, IRON, RETICCTPCT in the last 72 hours. Urine analysis:     Component Value Date/Time   COLORURINE AMBER (A) 05/02/2019 1700   APPEARANCEUR CLEAR (A) 05/02/2019 1700   APPEARANCEUR Clear 11/02/2014 1602   LABSPEC 1.008 05/02/2019 1700   PHURINE 6.0 05/02/2019 1700   GLUCOSEU NEGATIVE 05/02/2019 1700   HGBUR NEGATIVE 05/02/2019 1700   BILIRUBINUR NEGATIVE 05/02/2019 1700   BILIRUBINUR Negative 11/02/2014 1602   KETONESUR NEGATIVE 05/02/2019 1700   PROTEINUR NEGATIVE 05/02/2019 1700   NITRITE NEGATIVE 05/02/2019 1700   LEUKOCYTESUR NEGATIVE 05/02/2019 1700   Sepsis Labs: _0 (procalcitonin:4,lacticidven:4) ) Recent Results (from the past 240 hour(s))  Respiratory Panel by RT PCR (Flu A&B, Covid) - Nasopharyngeal Swab     Status: None   Collection Time: 05/11/19 11:24 AM   Specimen: Nasopharyngeal Swab  Result Value Ref Range Status   SARS Coronavirus 2 by RT PCR NEGATIVE NEGATIVE Final    Comment: (NOTE) SARS-CoV-2 target nucleic acids are NOT DETECTED. The SARS-CoV-2 RNA is generally detectable in upper respiratoy specimens during the acute phase of infection. The lowest concentration of SARS-CoV-2 viral copies this assay can detect is 131 copies/mL. A negative result does not preclude SARS-Cov-2 infection and should not be used as the sole basis for treatment or other patient management decisions. A negative result may occur with  improper specimen collection/handling, submission of specimen other than nasopharyngeal swab, presence of viral mutation(s) within the areas targeted by this assay, and inadequate number of viral copies (<131 copies/mL). A negative result must be combined with clinical observations, patient history, and epidemiological information. The expected result is Negative. Fact Sheet for Patients:  PinkCheek.be Fact Sheet for Healthcare Providers:  GravelBags.it This test is not yet ap proved or cleared by the [place] FDA and  has been  authorized for detection and/or diagnosis of SARS-CoV-2 by FDA under an Emergency Use Authorization (EUA). This EUA will remain  in effect (meaning this test can be used) for the duration of the COVID-19 declaration  under Section 564(b)(1) of the Act, 21 U.S.C. section 360bbb-3(b)(1), unless the authorization is terminated or revoked sooner.    Influenza A by PCR NEGATIVE NEGATIVE Final   Influenza B by PCR NEGATIVE NEGATIVE Final    Comment: (NOTE) The Xpert Xpress SARS-CoV-2/FLU/RSV assay is intended as an aid in  the diagnosis of influenza from Nasopharyngeal swab specimens and  should not be used as a sole basis for treatment. Nasal washings and  aspirates are unacceptable for Xpert Xpress SARS-CoV-2/FLU/RSV  testing. Fact Sheet for Patients: PinkCheek.be Fact Sheet for Healthcare Providers: GravelBags.it This test is not yet approved or cleared by the [place] FDA and  has been authorized for detection and/or diagnosis of SARS-CoV-2 by  FDA under an Emergency Use Authorization (EUA). This EUA will remain  in effect (meaning this test can be used) for the duration of the  Covid-19 declaration under Section 564(b)(1) of the Act, 21  U.S.C. section 360bbb-3(b)(1), unless the authorization is  terminated or revoked. Performed at [facility], [address], [place], [place] [address]      Radiological Exams on Admission: DG Chest [facility] 1 View  Result Date: 05/11/2019 CLINICAL DATA:  Shortness of breath EXAM: PORTABLE CHEST 1 VIEW COMPARISON:  May 02, 2019 chest radiograph as well as chest radiograph and chest CT April 28, 2019 FINDINGS: There remains underlying fibrosis bilaterally. There is consolidation with volume loss in the right upper lobe, similar to recent studies. There is patchy ill-defined opacity throughout the left mid and lower lung zones and right base, likely foci of multifocal pneumonia  superimposed on fibrosis. There is a fairly small pleural effusion on each side. No new opacity evident compared to recent studies. Heart size is within normal limits. There is distortion of pulmonary vascularity on the right. The pulmonary vascularity on the left is grossly normal. No adenopathy is appreciable by radiography. No focal bone lesions. IMPRESSION: Underlying fibrosis. Consolidation right upper lobe with volume loss, stable. Suspect patchy pneumonia elsewhere, stable from recent studies. Small pleural effusion bilaterally. Stable cardiac silhouette. Electronically Signed   By: Lowella Grip III M.D.   On: 05/11/2019 11:40     EKG: Independently reviewed.  Sinus rhythm, QTC 414, LAD, T wave inversion in V4-V6  Assessment/Plan Principal Problem:   Acute on chronic respiratory failure with hypoxia (HCC) Active Problems:   Benign hypertension   COPD exacerbation (HCC)   Essential hypertension   Protein-calorie malnutrition, severe   Gastroesophageal reflux disease without esophagitis   History of MAC infection   Acute on chronic systolic CHF (congestive heart failure) (HCC)   Elevated troponin   HCAP (healthcare-associated pneumonia)   Sepsis ([place])   CAD (coronary artery disease)   BPH (benign prostatic hyperplasia)   Depression   Stroke ([place])   Acute on chronic respiratory failure with hypoxia: Likely due to multifactorial etiology, including COPD exacerbation, possible HCAP, CHF exacerbation and history of MAC infection.  Patient has leukocytosis, patchy infiltration on right upper lobe, indicating possible HCAP.  Patient has no leg edema, but has positive JVD, elevated BNP>4500, indicating CHF exacerbation.  Patient has coarse breathing sound on auscultation, indicating COPD exacerbation.  -Admit to Med-surg bed as inpt -Bronchodilators -IV diuretics: 40 of IV lasix in ED -Nasal cannula oxygen to maintain oxygen saturation above 93% when patient is off  BiPAP  Possible HCAP: - IV Vancomycin and cefepime  - Mucinex for cough  - Bronchodilators - Urine legionella and S. pneumococcal antigen - Follow up blood  culture x2, sputum culture  COPD exacerbation: -solumedrol 40 mg bid -Bronchodilators as above -Singulair  History of MAC infection: -continue ethambutol and rifampin  Sepsis: Patient meets criteria for sepsis with leukocytosis, tachycardia and tachypnea.  Lactic acid elevated 2.5 -Will not give IV fluid since patient has CHF exacerbation -Follow-up of blood culture  Acute on chronic systolic CHF: 2D echo on 03/06/7586 showed EF 35-40% -IV Lasix 40 mg in ED, will not give more diuretics since lactic acid level is trending up from 2.5 -->3.8  HTN:  -Continue home medications: -hydralazine prn  Hyperlipidemia -Crestor  Gastroesophageal reflux disease without esophagitis -Protonix  Elevated troponin and hx of CAD: Troponin 44 -->37. Pt has chronic elevation of troponin, recently 60s-160s -Crestor, metoprolol -Aspirin  Chest pain: Possibly due to pneumonia, but the patient reports coughing up streaks of blood, will need to rule out PE -Follow-up CT angiogram  BPH: stable - Continue Flomax and Proscar  BPH: stable - Continue Flomax and Proscar  Depression: Stable, no suicidal or homicidal ideations. -Continue home medications  Protein-calorie malnutrition, severe: BMI 16.81 -Ensure  Stroke [facility]): -ASA, crestor    Inpatient status:  # Patient requires inpatient status due to high intensity of service, high risk for further deterioration and high frequency of surveillance required.  I certify that at the point of admission it is my clinical judgment that the patient will require inpatient hospital care spanning beyond 2 midnights from the point of admission.  . This patient has multiple chronic comorbidities including hypertension, hyperlipidemia, COPD on 3 L nasal cannula oxygen at home, asthma,  stroke, GERD, depression, anxiety, RLS, CAD, sCHF with EF 35-40%, IBS, alcohol abuse, tobacco abuse, AAA, BPH, MAC infection . Now patient has presenting with acute on chronic respiratory failure with hypoxia, COPD exacerbation, sCHF exacerbation, HCAP, sepsis . The worrisome physical exam findings include thin body habitus, positive JVD, coarse breathing sound on auscultation . The initial radiographic and laboratory data are worrisome because of right upper lobe patchy infiltration on chest x-ray, elevated BNP, elevated troponin, elevated lactic acid, . Current medical needs: please see my assessment and plan . Predictability of an adverse outcome (risk): Patient has multiple comorbidities as listed above. Now presents with acute on chronic respiratory failure with hypoxia, COPD exacerbation, sCHF exacerbation, HCAP, sepsis. Patient's presentation is highly complicated.  Patient is at high risk of deteriorating.  Will need to be treated in hospital for at least 2 days.            DVT ppx: SQ Lovenox Code Status: Full code Family Communication: not done, no family member is at bed side.              Yes, patient's    at bed side Disposition Plan:  Anticipate discharge back to previous home environment Consults called:   Admission status: Med-surg bed for obs as inpt    Tele bed for obs   as inpt      SDU/inpation       Date of Service 05/11/2019    [facility]   If 7PM-7AM, please contact night-coverage [email or web address] 05/11/2019, 3:47 PM

## 2019-05-11 NOTE — ED Triage Notes (Signed)
Pt arrived via [facility] from home with difficulty breathing. Pt has been admitted for fluid on his lungs several times.

## 2019-05-12 ENCOUNTER — Other Ambulatory Visit: Payer: Self-pay

## 2019-05-12 ENCOUNTER — Encounter: Payer: Self-pay | Admitting: Internal Medicine

## 2019-05-12 LAB — LACTIC ACID, PLASMA
Lactic Acid, Venous: 2.5 mmol/L (ref 0.5–1.9)
Lactic Acid, Venous: 2.7 mmol/L (ref 0.5–1.9)
Lactic Acid, Venous: 2 mmol/L (ref 0.5–1.9)

## 2019-05-12 LAB — CREATININE, SERUM
Creatinine, Ser: 1.08 mg/dL (ref 0.61–1.24)
GFR calc Af Amer: >60 mL/min (ref >60)
GFR calc non Af Amer: >60 mL/min (ref >60)

## 2019-05-12 MED ORDER — ORAL CARE MOUTH RINSE
15.0000 mL | Freq: Two times a day (BID) | OROMUCOSAL | Status: DC
  Administered 2019-05-12 – 2019-05-14 (×6): 15 mL via OROMUCOSAL

## 2019-05-12 MED ORDER — ENSURE ENLIVE PO LIQD
237.0000 mL | Freq: Three times a day (TID) | ORAL | Status: DC
  Administered 2019-05-12 – 2019-05-14 (×5): 237 mL via ORAL

## 2019-05-12 NOTE — Progress Notes (Signed)
Initial Nutrition Assessment  RD working remotely.  DOCUMENTATION CODES:   Underweight  INTERVENTION:  Provide Ensure Enlive po TID, each supplement provides 350 kcal and 20 grams of protein.  Provide Magic cup TID with meals, each supplement provides 290 kcal and 9 grams of protein.  Continue MVI daily, thiamine 240 mg daily, folic acid 1 mg daily.  Monitor magnesium, potassium, and phosphorus daily for at least 3 days, MD to replete as needed, as pt is at risk for refeeding syndrome.  NUTRITION DIAGNOSIS:   Increased nutrient needs related to catabolic illness(COPD, CHF, MAC infection) as evidenced by estimated needs.  GOAL:   Patient will meet greater than or equal to 90% of their needs  MONITOR:   PO intake, Supplement acceptance, Labs, Weight trends, I & O's  REASON FOR ASSESSMENT:   Malnutrition Screening Tool    ASSESSMENT:   64 year old male with PMHx of depression, HTN, asthma, anxiety, arthritis, IBS, sciatica, spinal stenosis of lumbar region, AAA, restless leg syndrome, hx MI, CAD, hx CVA, COPD, GERD, neuropathy, HLD, hx EtOH abuse, CHF with EF 35-40%, MAC infection admitted with possible HCAP, COPD exacerbation, acute on chronic CHF.   Spoke with patient over the phone. He reports his appetite remains poor. He continues to only eat bites at meals. He enjoys chocolate Ensure and vanilla Magic Cup and is willing to eat them to help meet calorie/protein needs.  Patient continues to lose weight. Patient was 139 lbs on 07/01/2018. He was 50.4 kg on 04/28/2019 and is now 45.8 kg (101 lbs). He has lost 38 lbs (27% body weight) over the past 10 months, which is significant for time frame.  Medications reviewed and include: folic acid 1 mg daily, gabapentin, Solu-Medrol 40 mg Q12hrs IV, Remeron 7.5 mg QHS, MVI daily, nicotine patch, pantoprazole, Flomax, thiamine 100 mg daily, cefepime.  Labs reviewed: On 3/15 Chloride 97.  RD suspects patient still meets criteria for  severe chronic malnutrition but unable to use this nutrition diagnosis without completing NFPE.  NUTRITION - FOCUSED PHYSICAL EXAM:  Unable to complete at this time.  Diet Order:   Diet Order            Diet 2 gram sodium Room service appropriate? Yes; Fluid consistency: Thin  Diet effective now             EDUCATION NEEDS:   Not appropriate for education at this time  Skin:  Skin Assessment: Reviewed RN Assessment  Last BM:  05/11/2019  Height:   Ht Readings from Last 1 Encounters:  05/11/19 _0  (1.651 m)   Weight:   Wt Readings from Last 1 Encounters:  05/11/19 45.8 kg   BMI:  Body mass index is 16.81 kg/m.  Estimated Nutritional Needs:   Kcal:  1700-1900  Protein:  75-85 grams  Fluid:  1.2-1.5 L/day  Jacklynn Barnacle, MS, RD, LDN Pager number available on Amion

## 2019-05-12 NOTE — Consult Note (Signed)
Infectious Disease     Reason for Consult: MAC, Pna  Referring Physician: Dr Jimmye Norman Date of Admission:  05/11/2019   Principal Problem:   Acute on chronic respiratory failure with hypoxia ([place]) Active Problems:   Benign hypertension   COPD exacerbation (HCC)   Essential hypertension   Protein-calorie malnutrition, severe   Gastroesophageal reflux disease without esophagitis   History of MAC infection   Acute on chronic systolic CHF (congestive heart failure) (HCC)   Elevated troponin   HCAP (healthcare-associated pneumonia)   Sepsis ([place])   CAD (coronary artery disease)   BPH (benign prostatic hyperplasia)   Depression   Stroke [facility])   HPI: Joseph Peters is a 64 y.o. male  admitted on 05/11/2019 with symptoms of cough, with hemoptysis, chest pain, NV, loose stools. He has a PMH of hypertension, hyperlipidemia, COPD on 3 L nasal cannula oxygen at home, asthma, stroke, GERD, depression, anxiety, RLS, CAD, CHF with EF 35-40%, IBS, alcohol abuse, tobacco abuse, AAA, BPH, MAC infection (he is on ethambutol and rifampin outpatient) He follows with Dr Delaine Lame.  On admit wbc 12, no fevers. CT showed similar appearance of chronic cavitation and consolidation. BNP was elevated >3500, lactic acid 2.7, PC <0.1.  MRSA PCR neg.  [place], sputum cx pending He had fu AFB and fungal studies 2/24 ngtd.  Started on cefepime and vanco.   Past Medical History:  Diagnosis Date  . AAA (abdominal aortic aneurysm) without rupture ([place])   . Abdominal aneurysm ([place]) 2015   being followed but not large enough to surgically treat  . Alcoholism ([place])   . Anxiety   . Arthritis   . Asthma   . Benign hypertension 07/16/2014  . Chest pain on exertion 07/16/2014  . Chronic pain syndrome   . Coccydynia 02/08/2012  . COPD (chronic obstructive pulmonary disease) ([place])    patient smokes  . Coronary artery disease   . 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 Depression   . 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 Dyspnea   . Frequent falls   . GERD (gastroesophageal reflux disease)   . Hyperlipidemia   . IBS (irritable bowel syndrome)   . Myocardial infarction ([place]) 2010  . Neuropathy   . Pneumonia   . Prostatitis   . Restless leg syndrome   . Sciatica   . Spinal stenosis of lumbar region   . Stroke ([place]) 2015   no residual symptoms  . Tremor, essential    Past Surgical History:  Procedure Laterality Date  . CARDIAC CATHETERIZATION  2012  . COLONOSCOPY WITH PROPOFOL N/A 11/14/2016   Procedure: COLONOSCOPY WITH PROPOFOL;  Surgeon: Manya Silvas, MD;  Location: [facility] ENDOSCOPY;  Service: Endoscopy;  Laterality: N/A;  . ESOPHAGOGASTRODUODENOSCOPY (EGD) WITH PROPOFOL N/A 11/14/2016   Procedure: ESOPHAGOGASTRODUODENOSCOPY (EGD) WITH PROPOFOL;  Surgeon: Manya Silvas, MD;  Location: [facility] ENDOSCOPY;  Service: Endoscopy;  Laterality: N/A;  . JOINT REPLACEMENT Right    hip  . KNEE SURGERY     right  . SHOULDER ARTHROSCOPY WITH OPEN ROTATOR CUFF REPAIR Right 08/20/2017   Procedure: SHOULDER ARTHROSCOPY WITH OPEN ROTATOR CUFF REPAIR;  Surgeon: Corky Mull, MD;  Location: [facility] ORS;  Service: Orthopedics;  Laterality: Right;  . SHOULDER ARTHROSCOPY WITH SUBACROMIAL DECOMPRESSION, ROTATOR CUFF REPAIR AND BICEP TENDON REPAIR Left 05/10/2016   Procedure: SHOULDER ARTHROSCOPY WITH DEBTRIDEMENT, DECOMPRESSION, REPAIR OF MASSIVE ROTATOR CUFF TEAR AND BICEP TENODESIS;  Surgeon: Corky Mull, MD;  Location: [facility] ORS;  Service: Orthopedics;  Laterality: Left;  Massive cuff tear  . TOTAL HIP ARTHROPLASTY Right 07/01/2018   Procedure: TOTAL HIP ARTHROPLASTY - RIGHT;  Surgeon: Corky Mull, MD;  Location: [facility] ORS;  Service: Orthopedics;  Laterality: Right;  Marland Kitchen VIDEO BRONCHOSCOPY WITH ENDOBRONCHIAL NAVIGATION N/A 02/13/2019   Procedure: VIDEO BRONCHOSCOPY WITH ENDOBRONCHIAL NAVIGATION;  Surgeon: Ottie Glazier, MD;  Location: [facility] ORS;  Service: Thoracic;   Laterality: N/A;  . VIDEO BRONCHOSCOPY WITH ENDOBRONCHIAL NAVIGATION N/A 03/13/2019   Procedure: VIDEO BRONCHOSCOPY WITH ENDOBRONCHIAL NAVIGATION;  Surgeon: Ottie Glazier, MD;  Location: [facility] ORS;  Service: Thoracic;  Laterality: N/A;  . VIDEO BRONCHOSCOPY WITH ENDOBRONCHIAL ULTRASOUND N/A 02/13/2019   Procedure: VIDEO BRONCHOSCOPY WITH ENDOBRONCHIAL ULTRASOUND;  Surgeon: Ottie Glazier, MD;  Location: [facility] ORS;  Service: Thoracic;  Laterality: N/A;  . VIDEO BRONCHOSCOPY WITH ENDOBRONCHIAL ULTRASOUND N/A 03/13/2019   Procedure: VIDEO BRONCHOSCOPY WITH ENDOBRONCHIAL ULTRASOUND;  Surgeon: Ottie Glazier, MD;  Location: [facility] ORS;  Service: Thoracic;  Laterality: N/A;   Social History   Tobacco Use  . Smoking status: Light Tobacco Smoker    Packs/day: 0.50    Years: 30.00    Pack years: 15.00    Types: Cigarettes    Start date: 02/26/1973    Last attempt to quit: 09/21/2018    Years since quitting: 0.6  . Smokeless tobacco: Former Systems developer    Quit date: 02/26/1998  . Tobacco comment: smokes 2-3 cig a week 02/12/19; per pt no longer smokes  Substance Use Topics  . Alcohol use: Yes    Alcohol/week: 20.0 standard drinks    Types: 20 Cans of beer per week    Comment: week   . Drug use: Yes    Comment: prescribed hydrocodone   Family History  Problem Relation Age of Onset  . Cancer Mother   . COPD Father 68       black lung    Allergies:  Allergies  Allergen Reactions  . Penicillins Swelling, Rash and Other (See Comments)    Swelling in throat  Did it involve swelling of the face/tongue/throat, SOB, or low BP? yes Did it involve sudden or severe rash/hives, skin peeling, or any reaction on the inside of your mouth or nose? no Did you need to seek medical attention at a hospital or doctor's office? Yes When did it last happen?years  If all above answers are "NO", may proceed with cephalosporin use.    . Lidocaine Other (See Comments)    Unsure  At dentist's office     Current antibiotics: Antibiotics Given (last 72 hours)    Date/Time Action Medication Dose Rate   05/11/19 1327 New Bag/Given   levofloxacin (LEVAQUIN) IVPB 750 mg 750 mg 100 mL/hr   05/11/19 2120 New Bag/Given   vancomycin (VANCOCIN) IVPB 1000 mg/200 mL premix 1,000 mg 200 mL/hr   05/11/19 2244 New Bag/Given   ceFEPIme (MAXIPIME) 2 g in sodium chloride 0.9 % 100 mL IVPB 2 g 200 mL/hr   05/12/19 1100 Given   ethambutol (MYAMBUTOL) tablet 800 mg 800 mg    05/12/19 1100 Given   rifampin (RIFADIN) capsule 600 mg 600 mg    05/12/19 1121 New Bag/Given   ceFEPIme (MAXIPIME) 2 g in sodium chloride 0.9 % 100 mL IVPB 2 g 200 mL/hr      MEDICATIONS: . aspirin EC  81 mg Oral Daily  . dextromethorphan-guaiFENesin  1 tablet Oral BID  . ethambutol  800 mg Oral Daily  . feeding supplement (  ENSURE ENLIVE)  237 mL Oral BID BM  . finasteride  5 mg Oral Daily  . FLUoxetine  20 mg Oral Daily  . fluticasone furoate-vilanterol  1 puff Inhalation Daily  . folic acid  1 mg Oral Daily  . gabapentin  900 mg Oral QHS  . heparin  5,000 Units Subcutaneous Q8H  . ipratropium-albuterol  3 mL Nebulization Q4H  . mouth rinse  15 mL Mouth Rinse BID  . methylPREDNISolone (SOLU-MEDROL) injection  40 mg Intravenous Q12H  . metoprolol succinate  12.5 mg Oral Daily  . mirtazapine  7.5 mg Oral QHS  . montelukast  10 mg Oral QHS  . multivitamin with minerals  1 tablet Oral Daily  . nicotine  21 mg Transdermal Q24H  . pantoprazole  40 mg Oral Daily  . rifampin  600 mg Oral Daily  . roflumilast  250 mcg Oral QHS  . rosuvastatin  5 mg Oral QHS  . tamsulosin  0.4 mg Oral QPC supper  . thiamine  100 mg Oral Daily  . umeclidinium bromide  1 puff Inhalation Daily    Review of Systems - 11 systems reviewed and negative per HPI   OBJECTIVE: Temp:  [97.6 F (36.4 C)-98.4 F (36.9 C)] 97.9 F (36.6 C) (03/16 0832) Pulse Rate:  [85-107] 92 (03/16 0500) Resp:  [15-24] 15 (03/16 0832) BP: (93-141)/(57-93)  121/73 (03/16 0832) SpO2:  [91 %-100 %] 99 % (03/16 3762) Physical Exam  Constitutional: He is oriented to person, place, and time. Thin, On O2, cannot speak in full sentences HENT: anicteric Mouth/Throat: Oropharynx is clear and moist. No oropharyngeal exudate.  Cardiovascular: Normal rate, regular rhythm and normal heart sounds.  Pulmonary/Chest: poor air movement, rhonchi bil Abdominal: Soft. Bowel sounds are normal. He exhibits no distension. There is no tenderness.  Lymphadenopathy: He has no cervical adenopathy.  Neurological: He is alert and oriented to person, place, and time.  Skin: Skin is warm and dry. No rash noted. No erythema.  Psychiatric: He has a normal mood and affect. His behavior is normal.   LABS: Results for orders placed or performed during the hospital encounter of 05/11/19 (from the past 48 hour(s))  Respiratory Panel by RT PCR (Flu A&B, Covid) - Nasopharyngeal Swab     Status: None   Collection Time: 05/11/19 11:24 AM   Specimen: Nasopharyngeal Swab  Result Value Ref Range   SARS Coronavirus 2 by RT PCR NEGATIVE NEGATIVE    Comment: (NOTE) SARS-CoV-2 target nucleic acids are NOT DETECTED. The SARS-CoV-2 RNA is generally detectable in upper respiratoy specimens during the acute phase of infection. The lowest concentration of SARS-CoV-2 viral copies this assay can detect is 131 copies/mL. A negative result does not preclude SARS-Cov-2 infection and should not be used as the sole basis for treatment or other patient management decisions. A negative result may occur with  improper specimen collection/handling, submission of specimen other than nasopharyngeal swab, presence of viral mutation(s) within the areas targeted by this assay, and inadequate number of viral copies (<131 copies/mL). A negative result must be combined with clinical observations, patient history, and epidemiological information. The expected result is Negative. Fact Sheet for Patients:   PinkCheek.be Fact Sheet for Healthcare Providers:  GravelBags.it This test is not yet ap proved or cleared by the [place] FDA and  has been authorized for detection and/or diagnosis of SARS-CoV-2 by FDA under an Emergency Use Authorization (EUA). This EUA will remain  in effect (meaning this test can be used) for  the duration of the COVID-19 declaration under Section 564(b)(1) of the Act, 21 U.S.C. section 360bbb-3(b)(1), unless the authorization is terminated or revoked sooner.    Influenza A by PCR NEGATIVE NEGATIVE   Influenza B by PCR NEGATIVE NEGATIVE    Comment: (NOTE) The Xpert Xpress SARS-CoV-2/FLU/RSV assay is intended as an aid in  the diagnosis of influenza from Nasopharyngeal swab specimens and  should not be used as a sole basis for treatment. Nasal washings and  aspirates are unacceptable for Xpert Xpress SARS-CoV-2/FLU/RSV  testing. Fact Sheet for Patients: PinkCheek.be Fact Sheet for Healthcare Providers: GravelBags.it This test is not yet approved or cleared by the [place] FDA and  has been authorized for detection and/or diagnosis of SARS-CoV-2 by  FDA under an Emergency Use Authorization (EUA). This EUA will remain  in effect (meaning this test can be used) for the duration of the  Covid-19 declaration under Section 564(b)(1) of the Act, 21  U.S.C. section 360bbb-3(b)(1), unless the authorization is  terminated or revoked. Performed at [facility], [address] [place], [place] [address]   CBC with Differential     Status: Abnormal   Collection Time: 05/11/19 11:34 AM  Result Value Ref Range   WBC 12.1 (H) 4.0 - 10.5 K/uL   RBC 4.99 4.22 - 5.81 MIL/uL   Hemoglobin 14.3 13.0 - 17.0 g/dL   HCT 44.7 39.0 - 52.0 %   MCV 89.6 80.0 - 100.0 fL   MCH 28.7 26.0 - 34.0 pg   MCHC 32.0 30.0 - 36.0 g/dL   RDW 14.3 11.5 - 15.5 %    Platelets 277 150 - 400 K/uL   nRBC 0.0 0.0 - 0.2 %   Neutrophils Relative % 77 %   Neutro Abs 9.4 (H) 1.7 - 7.7 K/uL   Lymphocytes Relative 12 %   Lymphs Abs 1.4 0.7 - 4.0 K/uL   Monocytes Relative 8 %   Monocytes Absolute 0.9 0.1 - 1.0 K/uL   Eosinophils Relative 2 %   Eosinophils Absolute 0.3 0.0 - 0.5 K/uL   Basophils Relative 1 %   Basophils Absolute 0.1 0.0 - 0.1 K/uL   Immature Granulocytes 0 %   Abs Immature Granulocytes 0.05 0.00 - 0.07 K/uL    Comment: Performed at [facility], [address], [place], [place] [address]  Basic metabolic panel     Status: Abnormal   Collection Time: 05/11/19 11:34 AM  Result Value Ref Range   Sodium 135 135 - 145 mmol/L   Potassium 3.7 3.5 - 5.1 mmol/L   Chloride 97 (L) 98 - 111 mmol/L   CO2 23 22 - 32 mmol/L   Glucose, Bld 104 (H) 70 - 99 mg/dL    Comment: Glucose reference range applies only to samples taken after fasting for at least 8 hours.   BUN 9 8 - 23 mg/dL   Creatinine, Ser 1.09 0.61 - 1.24 mg/dL   Calcium 9.4 8.9 - 10.3 mg/dL   GFR calc non Af Amer >60 >60 mL/min   GFR calc Af Amer >60 >60 mL/min   Anion gap 15 5 - 15    Comment: Performed at [facility], [address] [place], [place] [address]  Brain natriuretic peptide     Status: Abnormal   Collection Time: 05/11/19 11:34 AM  Result Value Ref Range   B Natriuretic Peptide >4,500.0 (H) 0.0 - 100.0 pg/mL    Comment: Performed at [facility], [place],  [place] [address]  Troponin I (High Sensitivity)     Status: Abnormal   Collection Time: 05/11/19 11:34 AM  Result Value Ref Range   Troponin I (High Sensitivity) 44 (H) <18 ng/L    Comment: (NOTE) Elevated high sensitivity troponin I (hsTnI) values and significant  changes across serial measurements may suggest ACS but many other  chronic and acute conditions are known to elevate hsTnI results.  Refer to the "Links" section for chest pain algorithms and additional   guidance. Performed at [facility], [address] [place], [place] [address]   Blood culture (routine x 2)     Status: None (Preliminary result)   Collection Time: 05/11/19 11:34 AM   Specimen: BLOOD  Result Value Ref Range   Specimen Description BLOOD RIGHT AC    Special Requests      BOTTLES DRAWN AEROBIC AND ANAEROBIC Blood Culture results may not be optimal due to an inadequate volume of blood received in culture bottles   Culture      NO GROWTH < 24 HOURS Performed at [facility], [address] [place], [place] [address]    Report Status PENDING   Lactic acid, plasma     Status: Abnormal   Collection Time: 05/11/19 11:34 AM  Result Value Ref Range   Lactic Acid, Venous 2.5 (HH) 0.5 - 1.9 mmol/L    Comment: CRITICAL RESULT CALLED TO, READ BACK BY AND VERIFIED WITH DEVETTA MCCLAIN ON 05/11/19 AT 1244 BY JAG Performed at [facility], [address] [place], [place] [address]   Blood culture (routine x 2)     Status: None (Preliminary result)   Collection Time: 05/11/19 11:35 AM   Specimen: BLOOD  Result Value Ref Range   Specimen Description BLOOD RIGHT ARM    Special Requests      BOTTLES DRAWN AEROBIC AND ANAEROBIC Blood Culture results may not be optimal due to an inadequate volume of blood received in culture bottles   Culture      NO GROWTH < 24 HOURS Performed at [facility], [address] [place], [place] [address]    Report Status PENDING   Lactic acid, plasma     Status: Abnormal   Collection Time: 05/11/19  1:30 PM  Result Value Ref Range   Lactic Acid, Venous 3.8 (HH) 0.5 - 1.9 mmol/L    Comment: CRITICAL RESULT CALLED TO, READ BACK BY AND VERIFIED WITH CHRISSY BRAND ON 05/11/19 AT 1358 QSD Performed at [facility], [place], [place] [address]   Troponin I (High Sensitivity)     Status: Abnormal   Collection Time: 05/11/19  1:30 PM  Result Value Ref Range   Troponin I (High  Sensitivity) 37 (H) <18 ng/L    Comment: (NOTE) Elevated high sensitivity troponin I (hsTnI) values and significant  changes across serial measurements may suggest ACS but many other  chronic and acute conditions are known to elevate hsTnI results.  Refer to the "Links" section for chest pain algorithms and additional  guidance. Performed at [facility], [address] [place], [place] [address]   MRSA PCR Screening     Status: None   Collection Time: 05/11/19  8:19 PM   Specimen: Nasopharyngeal  Result Value Ref Range   MRSA by PCR NEGATIVE NEGATIVE    Comment:        The GeneXpert MRSA Assay (FDA approved for NASAL specimens only), is one component of a comprehensive MRSA colonization  surveillance program. It is not intended to diagnose MRSA infection nor to guide or monitor treatment for MRSA infections. Performed at [facility], [address] [place], [place] [address]   HIV Antibody (routine testing w rflx)     Status: None   Collection Time: 05/11/19  8:21 PM  Result Value Ref Range   HIV Screen 4th Generation wRfx NON REACTIVE NON REACTIVE    Comment: Performed at [place] [address] [place], [place] [address]  Procalcitonin     Status: None   Collection Time: 05/11/19  8:21 PM  Result Value Ref Range   Procalcitonin <0.10 ng/mL    Comment:        Interpretation: PCT (Procalcitonin) <= 0.5 ng/mL: Systemic infection (sepsis) is not likely. Local bacterial infection is possible. (NOTE)       Sepsis PCT Algorithm           Lower Respiratory Tract                                      Infection PCT Algorithm    ----------------------------     ----------------------------         PCT < 0.25 ng/mL                PCT < 0.10 ng/mL         Strongly encourage             Strongly discourage   discontinuation of antibiotics    initiation of antibiotics    ----------------------------     -----------------------------       PCT 0.25  - 0.50 ng/mL            PCT 0.10 - 0.25 ng/mL               OR       >80% decrease in PCT            Discourage initiation of                                            antibiotics      Encourage discontinuation           of antibiotics    ----------------------------     -----------------------------         PCT >= 0.50 ng/mL              PCT 0.26 - 0.50 ng/mL               AND        <80% decrease in PCT             Encourage initiation of                                             antibiotics       Encourage continuation           of antibiotics    ----------------------------     -----------------------------        PCT >= 0.50 ng/mL                  PCT >  0.50 ng/mL               AND         increase in PCT                  Strongly encourage                                      initiation of antibiotics    Strongly encourage escalation           of antibiotics                                     -----------------------------                                           PCT <= 0.25 ng/mL                                                 OR                                        > 80% decrease in PCT                                     Discontinue / Do not initiate                                             antibiotics Performed at [facility], [address] [place], [place] [address]   Lactic acid, plasma     Status: Abnormal   Collection Time: 05/11/19  8:24 PM  Result Value Ref Range   Lactic Acid, Venous 5.3 (HH) 0.5 - 1.9 mmol/L    Comment: CRITICAL VALUE NOTED. VALUE IS CONSISTENT WITH PREVIOUSLY REPORTED/CALLED VALUE/TFK Performed at [facility], [address] [place], [place] [address]   Lactic acid, plasma     Status: Abnormal   Collection Time: 05/11/19 10:28 PM  Result Value Ref Range   Lactic Acid, Venous 2.9 (HH) 0.5 - 1.9 mmol/L    Comment: CRITICAL VALUE NOTED. VALUE IS CONSISTENT WITH PREVIOUSLY REPORTED/CALLED VALUE RWW Performed  at [facility], [address] [place], [place] [address]   Creatinine, serum     Status: None   Collection Time: 05/12/19  2:41 AM  Result Value Ref Range   Creatinine, Ser 1.08 0.61 - 1.24 mg/dL   GFR calc non Af Amer >60 >60 mL/min   GFR calc Af Amer >60 >60 mL/min    Comment: Performed at [facility], [address] [place], [place] [address]  Lactic acid, plasma     Status: Abnormal   Collection Time: 05/12/19  2:41 AM  Result Value Ref Range   Lactic Acid, Venous 2.5 (HH) 0.5 -  1.9 mmol/L    Comment: CRITICAL VALUE NOTED. VALUE IS CONSISTENT WITH PREVIOUSLY REPORTED/CALLED VALUE RWW Performed at [facility], [place], [place] [address]   Lactic acid, plasma     Status: Abnormal   Collection Time: 05/12/19  5:31 AM  Result Value Ref Range   Lactic Acid, Venous 2.7 (HH) 0.5 - 1.9 mmol/L    Comment: CRITICAL VALUE NOTED. VALUE IS CONSISTENT WITH PREVIOUSLY REPORTED/CALLED VALUE SDR Performed at [facility], [address] [place], [place] [address]   Lactic acid, plasma     Status: Abnormal   Collection Time: 05/12/19 12:38 PM  Result Value Ref Range   Lactic Acid, Venous 2.0 (HH) 0.5 - 1.9 mmol/L    Comment: CRITICAL VALUE NOTED. VALUE IS CONSISTENT WITH PREVIOUSLY REPORTED/CALLED VALUE/HKP Performed at [facility], [address] [place], [place] [address]    No components found for: ESR, C REACTIVE PROTEIN MICRO: Recent Results (from the past 720 hour(s))  Blood culture (routine x 2)     Status: None   Collection Time: 04/16/19  2:18 PM   Specimen: BLOOD  Result Value Ref Range Status   Specimen Description BLOOD RIGHT ANTECUBITAL  Final   Special Requests   Final    BOTTLES DRAWN AEROBIC AND ANAEROBIC Blood Culture adequate volume   Culture   Final    NO GROWTH 5 DAYS Performed at [facility], [address] [place], [place] [address]    Report Status 04/21/2019 FINAL  Final   Respiratory Panel by RT PCR (Flu A&B, Covid) - Nasopharyngeal Swab     Status: None   Collection Time: 04/16/19  2:19 PM   Specimen: Nasopharyngeal Swab  Result Value Ref Range Status   SARS Coronavirus 2 by RT PCR NEGATIVE NEGATIVE Final    Comment: (NOTE) SARS-CoV-2 target nucleic acids are NOT DETECTED. The SARS-CoV-2 RNA is generally detectable in upper respiratoy specimens during the acute phase of infection. The lowest concentration of SARS-CoV-2 viral copies this assay can detect is 131 copies/mL. A negative result does not preclude SARS-Cov-2 infection and should not be used as the sole basis for treatment or other patient management decisions. A negative result may occur with  improper specimen collection/handling, submission of specimen other than nasopharyngeal swab, presence of viral mutation(s) within the areas targeted by this assay, and inadequate number of viral copies (<131 copies/mL). A negative result must be combined with clinical observations, patient history, and epidemiological information. The expected result is Negative. Fact Sheet for Patients:  PinkCheek.be Fact Sheet for Healthcare Providers:  GravelBags.it This test is not yet ap proved or cleared by the [place] FDA and  has been authorized for detection and/or diagnosis of SARS-CoV-2 by FDA under an Emergency Use Authorization (EUA). This EUA will remain  in effect (meaning this test can be used) for the duration of the COVID-19 declaration under Section 564(b)(1) of the Act, 21 U.S.C. section 360bbb-3(b)(1), unless the authorization is terminated or revoked sooner.    Influenza A by PCR NEGATIVE NEGATIVE Final   Influenza B by PCR NEGATIVE NEGATIVE Final    Comment: (NOTE) The Xpert Xpress SARS-CoV-2/FLU/RSV assay is intended as an aid in  the diagnosis of influenza from Nasopharyngeal swab specimens and  should not be used as a sole  basis for treatment. Nasal washings and  aspirates are unacceptable for Xpert Xpress SARS-CoV-2/FLU/RSV  testing. Fact Sheet for Patients: PinkCheek.be Fact Sheet for Healthcare Providers: GravelBags.it This test is  not yet approved or cleared by the [place] and  has been authorized for detection and/or diagnosis of SARS-CoV-2 by  FDA under an Emergency Use Authorization (EUA). This EUA will remain  in effect (meaning this test can be used) for the duration of the  Covid-19 declaration under Section 564(b)(1) of the Act, 21  U.S.C. section 360bbb-3(b)(1), unless the authorization is  terminated or revoked. Performed at [facility], [address] [place], [place] [address]   Blood culture (routine x 2)     Status: None   Collection Time: 04/16/19  2:28 PM   Specimen: BLOOD  Result Value Ref Range Status   Specimen Description BLOOD BLOOD RIGHT WRIST  Final   Special Requests   Final    BOTTLES DRAWN AEROBIC AND ANAEROBIC Blood Culture adequate volume   Culture   Final    NO GROWTH 5 DAYS Performed at [facility], [address] [place], [place] [address]    Report Status 04/21/2019 FINAL  Final  MRSA PCR Screening     Status: None   Collection Time: 04/16/19 10:12 PM   Specimen: Nasopharyngeal  Result Value Ref Range Status   MRSA by PCR NEGATIVE NEGATIVE Final    Comment:        The GeneXpert MRSA Assay (FDA approved for NASAL specimens only), is one component of a comprehensive MRSA colonization surveillance program. It is not intended to diagnose MRSA infection nor to guide or monitor treatment for MRSA infections. Performed at [facility], [address] [place], [place] [address]   Body fluid culture     Status: None   Collection Time: 04/22/19 12:13 PM   Specimen: PATH Cytology Pleural fluid  Result Value Ref Range Status   Specimen Description   Final     PLEURAL Performed at [facility], [address] [place], [place] [address]    Special Requests NONE  Final   Gram Stain   Final    FEW WBC PRESENT, PREDOMINANTLY MONONUCLEAR NO ORGANISMS SEEN    Culture   Final    NO GROWTH 3 DAYS Performed at [facility], [place] [address] [place], [place] [address]    Report Status 04/26/2019 FINAL  Final  Fungus Culture With Stain     Status: None (Preliminary result)   Collection Time: 04/22/19 12:13 PM   Specimen: PATH Cytology Pleural fluid  Result Value Ref Range Status   Fungus Stain Final report  Final    Comment: (NOTE) Performed At: [facility] [address] [place], [place] [id number] Rush Farmer MD ID:[id number]    Fungus (Mycology) Culture PENDING  Incomplete   Fungal Source PLEURAL  Final    Comment: Performed at [facility], [address] [place],  [address]  Acid Fast Smear (AFB)     Status: None   Collection Time: 04/22/19 12:13 PM   Specimen: PATH Cytology Pleural fluid  Result Value Ref Range Status   AFB Specimen Processing Concentration  Final   Acid Fast Smear Negative  Final    Comment: (NOTE) Performed At: [facility] [place], [place] [id number] Rush Farmer MD JK:[id number]    Source (AFB) PLEURAL  Final    Comment: Performed at [facility], [address] [place],  [address]  Fungus Culture Result     Status: None   Collection Time: 04/22/19 12:13 PM  Result Value Ref Range Status   Result 1 Comment  Final    Comment: (NOTE) KOH/Calcofluor preparation:  no fungus observed. Performed At: [facility] [place], [place] [id number] Rush Farmer MD LA:[id number]   Blood culture (routine x 2)     Status: None   Collection Time: 04/28/19 10:37 AM   Specimen: BLOOD RIGHT ARM  Result Value Ref Range Status   Specimen Description BLOOD RIGHT ARM  Final   Special Requests   Final    BOTTLES  DRAWN AEROBIC AND ANAEROBIC Blood Culture adequate volume   Culture   Final    NO GROWTH 5 DAYS Performed at [facility], [address] [place], [place] [address]    Report Status 05/03/2019 FINAL  Final  Blood culture (routine x 2)     Status: None   Collection Time: 04/28/19 10:37 AM   Specimen: BLOOD LEFT ARM  Result Value Ref Range Status   Specimen Description BLOOD LEFT ARM  Final   Special Requests   Final    BOTTLES DRAWN AEROBIC AND ANAEROBIC Blood Culture results may not be optimal due to an inadequate volume of blood received in culture bottles   Culture   Final    NO GROWTH 5 DAYS Performed at [facility], [address] [place], [place] [address]    Report Status 05/03/2019 FINAL  Final  Respiratory Panel by RT PCR (Flu A&B, Covid) - Nasopharyngeal Swab     Status: None   Collection Time: 04/28/19 11:37 AM   Specimen: Nasopharyngeal Swab  Result Value Ref Range Status   SARS Coronavirus 2 by RT PCR NEGATIVE NEGATIVE Final    Comment: (NOTE) SARS-CoV-2 target nucleic acids are NOT DETECTED. The SARS-CoV-2 RNA is generally detectable in upper respiratoy specimens during the acute phase of infection. The lowest concentration of SARS-CoV-2 viral copies this assay can detect is 131 copies/mL. A negative result does not preclude SARS-Cov-2 infection and should not be used as the sole basis for treatment or other patient management decisions. A negative result may occur with  improper specimen collection/handling, submission of specimen other than nasopharyngeal swab, presence of viral mutation(s) within the areas targeted by this assay, and inadequate number of viral copies (<131 copies/mL). A negative result must be combined with clinical observations, patient history, and epidemiological information. The expected result is Negative. Fact Sheet for Patients:  PinkCheek.be Fact Sheet for Healthcare Providers:   GravelBags.it This test is not yet ap proved or cleared by the [place] FDA and  has been authorized for detection and/or diagnosis of SARS-CoV-2 by FDA under an Emergency Use Authorization (EUA). This EUA will remain  in effect (meaning this test can be used) for the duration of the COVID-19 declaration under Section 564(b)(1) of the Act, 21 U.S.C. section 360bbb-3(b)(1), unless the authorization is terminated or revoked sooner.    Influenza A by PCR NEGATIVE NEGATIVE Final   Influenza B by PCR NEGATIVE NEGATIVE Final    Comment: (NOTE) The Xpert Xpress SARS-CoV-2/FLU/RSV assay is intended as an aid in  the diagnosis of influenza from Nasopharyngeal swab specimens and  should not be used as a sole basis for treatment. Nasal washings and  aspirates are unacceptable for Xpert Xpress SARS-CoV-2/FLU/RSV  testing. Fact Sheet for Patients: PinkCheek.be Fact Sheet for Healthcare Providers: GravelBags.it This test is not yet approved or cleared by the [place] FDA and  has been authorized for detection and/or diagnosis of SARS-CoV-2 by  FDA under an Emergency Use Authorization (EUA). This EUA will remain  in effect (meaning this test can be used) for the duration of the  Covid-19 declaration under Section 564(b)(1) of the Act, 21  U.S.C. section 360bbb-3(b)(1), unless the authorization is  terminated or revoked. Performed at [facility], [address] [place], [place] [address]   MRSA PCR Screening     Status: None   Collection Time: 04/29/19  4:52 PM   Specimen: Nasal Mucosa; Nasopharyngeal  Result Value Ref Range Status   MRSA by PCR NEGATIVE NEGATIVE Final    Comment:        The GeneXpert MRSA Assay (FDA approved for NASAL specimens only), is one component of a comprehensive MRSA colonization surveillance program. It is not intended to diagnose MRSA infection nor to  guide or monitor treatment for MRSA infections. Performed at [facility], [address] [place], [place] [address]   Respiratory Panel by RT PCR (Flu A&B, Covid) - Nasopharyngeal Swab     Status: None   Collection Time: 05/11/19 11:24 AM   Specimen: Nasopharyngeal Swab  Result Value Ref Range Status   SARS Coronavirus 2 by RT PCR NEGATIVE NEGATIVE Final    Comment: (NOTE) SARS-CoV-2 target nucleic acids are NOT DETECTED. The SARS-CoV-2 RNA is generally detectable in upper respiratoy specimens during the acute phase of infection. The lowest concentration of SARS-CoV-2 viral copies this assay can detect is 131 copies/mL. A negative result does not preclude SARS-Cov-2 infection and should not be used as the sole basis for treatment or other patient management decisions. A negative result may occur with  improper specimen collection/handling, submission of specimen other than nasopharyngeal swab, presence of viral mutation(s) within the areas targeted by this assay, and inadequate number of viral copies (<131 copies/mL). A negative result must be combined with clinical observations, patient history, and epidemiological information. The expected result is Negative. Fact Sheet for Patients:  PinkCheek.be Fact Sheet for Healthcare Providers:  GravelBags.it This test is not yet ap proved or cleared by the [place] FDA and  has been authorized for detection and/or diagnosis of SARS-CoV-2 by FDA under an Emergency Use Authorization (EUA). This EUA will remain  in effect (meaning this test can be used) for the duration of the COVID-19 declaration under Section 564(b)(1) of the Act, 21 U.S.C. section 360bbb-3(b)(1), unless the authorization is terminated or revoked sooner.    Influenza A by PCR NEGATIVE NEGATIVE Final   Influenza B by PCR NEGATIVE NEGATIVE Final    Comment: (NOTE) The Xpert Xpress SARS-CoV-2/FLU/RSV  assay is intended as an aid in  the diagnosis of influenza from Nasopharyngeal swab specimens and  should not be used as a sole basis for treatment. Nasal washings and  aspirates are unacceptable for Xpert Xpress SARS-CoV-2/FLU/RSV  testing. Fact Sheet for Patients: PinkCheek.be Fact Sheet for Healthcare Providers: GravelBags.it This test is not yet approved or cleared by the [place] FDA and  has been authorized for detection and/or diagnosis of SARS-CoV-2 by  FDA under an Emergency Use Authorization (EUA). This EUA will remain  in effect (meaning this test can be used) for the duration of the  Covid-19 declaration under Section 564(b)(1) of the Act, 21  U.S.C. section 360bbb-3(b)(1), unless the authorization is  terminated or revoked. Performed at [facility], [address] [place], [place] [address]   Blood culture (routine x 2)     Status: None (Preliminary result)   Collection Time: 05/11/19 11:34 AM   Specimen: BLOOD  Result Value Ref Range Status  Specimen Description BLOOD RIGHT AC  Final   Special Requests   Final    BOTTLES DRAWN AEROBIC AND ANAEROBIC Blood Culture results may not be optimal due to an inadequate volume of blood received in culture bottles   Culture   Final    NO GROWTH < 24 HOURS Performed at [facility], [address] [place], [place] [address]    Report Status PENDING  Incomplete  Blood culture (routine x 2)     Status: None (Preliminary result)   Collection Time: 05/11/19 11:35 AM   Specimen: BLOOD  Result Value Ref Range Status   Specimen Description BLOOD RIGHT ARM  Final   Special Requests   Final    BOTTLES DRAWN AEROBIC AND ANAEROBIC Blood Culture results may not be optimal due to an inadequate volume of blood received in culture bottles   Culture   Final    NO GROWTH < 24 HOURS Performed at [facility], [address], [place], [place]  [address]    Report Status PENDING  Incomplete  MRSA PCR Screening     Status: None   Collection Time: 05/11/19  8:19 PM   Specimen: Nasopharyngeal  Result Value Ref Range Status   MRSA by PCR NEGATIVE NEGATIVE Final    Comment:        The GeneXpert MRSA Assay (FDA approved for NASAL specimens only), is one component of a comprehensive MRSA colonization surveillance program. It is not intended to diagnose MRSA infection nor to guide or monitor treatment for MRSA infections. Performed at [facility], [address] [place], [place] [address]     IMAGING: [place] Chest 1 View  Result Date: 05/02/2019 CLINICAL DATA:  Pt states SOB, chest pain, cough for 3 months. Pt states diagnosed with double pneumonia last month. Hx of asthma, hypertension, copd, myocardial infarction, pneumonia. Current smoker EXAM: CHEST  1 VIEW COMPARISON:  Chest radiograph 04/28/2019 FINDINGS: Stable cardiomediastinal contours. Lungs are hyperinflated. Emphysema. Persistent consolidation throughout the right upper lobe. Interval mild decrease in bilateral diffuse opacities in the right lower lung and left mid to lower lung. No new focal opacity. No pneumothorax or large pleural effusion. No acute finding in the visualized skeleton. IMPRESSION: Persistent consolidation in the right upper lobe. Interval improvement in diffuse patchy opacities seen in the right lower and left mid to lower lungs. Electronically Signed   By: Audie Pinto M.D.   On: 05/02/2019 14:02   CT ANGIO CHEST PE W OR WO CONTRAST  Result Date: 05/11/2019 CLINICAL DATA:  Shortness of breath and cough.  Worsening dyspnea. EXAM: CT ANGIOGRAPHY CHEST WITH CONTRAST TECHNIQUE: Multidetector CT imaging of the chest was performed using the standard protocol during bolus administration of intravenous contrast. Multiplanar CT image reconstructions and MIPs were obtained to evaluate the vascular anatomy. CONTRAST:  68m OMNIPAQUE IOHEXOL 350 MG/ML SOLN  COMPARISON:  04/28/2019 FINDINGS: Cardiovascular: The heart size is normal. No substantial pericardial effusion. Coronary artery calcification is evident. Ascending thoracic aorta measures 4.3 cm diameter. Atherosclerotic calcification is noted in the wall of the thoracic aorta. No filling defect within the opacified pulmonary arteries to suggest the presence of an acute pulmonary embolus. Mediastinum/Nodes: Scattered small mediastinal lymph nodes again noted. No left hilar adenopathy. Stable soft tissue attenuation in the right hilar region. The esophagus has normal imaging features. There is no axillary lymphadenopathy. Lungs/Pleura: Centrilobular and paraseptal emphysema evident. The right upper lobe collapse/consolidative opacity is stable with cavitary component visible on 28/7. Similar architectural  distortion, scarring, and airspace disease in the right lower lobe, lingula, and left lower lobe. Small bilateral pleural effusions again noted. Upper Abdomen: Mild fullness noted left upper pole renal collecting system although renal pelvis and entire left kidney is not been visualized. Musculoskeletal: No worrisome lytic or sclerotic osseous abnormality. Mild inferior endplate compression deformity of a midthoracic vertebral body is stable. Review of the MIP images confirms the above findings. IMPRESSION: 1. No CT evidence for acute pulmonary embolus. 2. Similar appearance of the bilateral airspace disease with confluent consolidative change in the right lung apex demonstrating small cavitary component. Imaging features remain suspicious for multifocal pneumonia although neoplasm cannot be excluded. 3. Persistent small bilateral pleural effusions. 4. Ascending thoracic aorta measures 4.3 cm diameter. Recommend annual imaging followup by CTA or MRA. This recommendation follows 2010 ACCF/AHA/AATS/ACR/ASA/SCA/SCAI/SIR/STS/SVM Guidelines for the Diagnosis and Management of Patients with Thoracic Aortic Disease.  Circulation. 2010; 121: A250-N397. Aortic aneurysm NOS (ICD10-I71.9) 5.  Emphysema (ICD10-J43.9) and Aortic Atherosclerosis (ICD10-170.0) Electronically Signed   By: Misty Stanley M.D.   On: 05/11/2019 16:31   CT Angio Chest PE W/Cm &/Or Wo Cm  Result Date: 04/28/2019 CLINICAL DATA:  Short of breath.  Tachycardia. EXAM: CT ANGIOGRAPHY CHEST WITH CONTRAST TECHNIQUE: Multidetector CT imaging of the chest was performed using the standard protocol during bolus administration of intravenous contrast. Multiplanar CT image reconstructions and MIPs were obtained to evaluate the vascular anatomy. CONTRAST:  75 mL Omnipaque COMPARISON:  Chest CT 04/16/2019 FINDINGS: Cardiovascular: No filling defects within the pulmonary arteries to suggest acute pulmonary embolism. Ascending aorta is aneurysmal at 42 mm (image 30/7). Aneurysm is fusiform. No axillary supraclavicular adenopathy. No mediastinal hilar adenopathy. No pericardial effusion. Mediastinum/Nodes: No axillary supraclavicular adenopathy. No mediastinal adenopathy. Lungs/Pleura: Severe centrilobular emphysema. Superimposed consolidation in the RIGHT upper lobe not changed from prior. Small central cavitation is also similar (image 26/6). Pleural fluid at the RIGHT lung base and airspace disease in the RIGHT lower lobe is also similar. Within the LEFT lung, moderate pleural effusion. New airspace disease in the lower lobe and lingula. These findings in LEFT lung are worsened from comparison exam. Upper Abdomen: Limited view of the liver, kidneys, pancreas are unremarkable. Normal adrenal glands. Musculoskeletal: No aggressive osseous lesion. Review of the MIP images confirms the above findings. IMPRESSION: 1. No evidence acute pulmonary embolism. 2. Worsening airspace disease in the lingula and LEFT lower lobe suggesting pulmonary edema versus pneumonia. 3. Stable dense consolidation in the RIGHT upper lobe and diffuse airspace disease in the RIGHT lower lobe  suggesting multifocal pneumonia. 4. Bilateral pleural effusions unchanged. 5. Ascending thoracic aortic aneurysm. Recommend annual imaging followup by CTA or MRA. This recommendation follows 2010 ACCF/AHA/AATS/ACR/ASA/SCA/SCAI/SIR/STS/SVM Guidelines for the Diagnosis and Management of Patients with Thoracic Aortic Disease. Circulation. 2010; 121: Q734-L937. Aortic aneurysm NOS (ICD10-I71.9) Electronically Signed   By: Suzy Bouchard M.D.   On: 04/28/2019 12:47   CT Angio Chest PE W and/or Wo Contrast  Result Date: 04/16/2019 CLINICAL DATA:  Shortness of breath and hypoxia EXAM: CT ANGIOGRAPHY CHEST WITH CONTRAST TECHNIQUE: Multidetector CT imaging of the chest was performed using the standard protocol during bolus administration of intravenous contrast. Multiplanar CT image reconstructions and MIPs were obtained to evaluate the vascular anatomy. CONTRAST:  22m OMNIPAQUE IOHEXOL 350 MG/ML SOLN COMPARISON:  04/07/2019. FINDINGS: Cardiovascular: Atheromatous calcifications, including the coronary arteries and aorta. Normally opacified pulmonary arteries with no pulmonary arterial filling defects seen. The ascending thoracic aorta measures 4.5 cm in maximum diameter, previously 4.6  cm. Aneurysmal dilatation of the descending thoracic aorta with a maximum diameter of 3.5 cm, previously 3.8 cm. Aneurysmal dilatation of the infrarenal abdominal aorta with a maximum diameter of 4.0 cm on the included portions. Mediastinum/Nodes: Mildly enlarged right hilar lymph nodes with little change. The largest measures 1.1 cm in short axis diameter on image number 55 series 4. Thyroid gland, trachea, and esophagus demonstrate no significant findings. Lungs/Pleura: Stable marked bilateral centrilobular bullous changes. Previously demonstrated right upper lobe consolidation with air bronchograms and focal distal bronchial dilatation without significant change. Small amount of consolidation in the posterior left lower lobe with  little change. Moderate-sized right pleural effusion and small to moderate-sized left pleural effusion, both increased. Upper Abdomen: Dilated in for abdominal aorta as described above, not included in its entirety. Musculoskeletal: Stable 20% inferior endplate compression deformity of a midthoracic vertebral body. Review of the MIP images confirms the above findings. IMPRESSION: 1. No pulmonary emboli. 2. Stable right upper lobe consolidation with air bronchograms and focal distal bronchial dilatation. 3. Stable small amount of consolidation in the posterior left lower lobe. 4. Stable mild right hilar adenopathy, most likely reactive. 5. Stable marked changes of COPD. 6. Stable aneurysmal dilatation of the ascending thoracic aorta and descending thoracic aorta with interval included aneurysmal dilatation of the infrarenal abdominal aorta, not included in its entirety, measuring 4.0 cm. 7. Moderate-sized right pleural effusion and small to moderate-sized left pleural effusion, both increased. 8. Calcific coronary artery and aortic atherosclerosis. Aortic Atherosclerosis (ICD10-I70.0) and Emphysema (ICD10-J43.9). Electronically Signed   By: Claudie Revering M.D.   On: 04/16/2019 16:28   DG Chest Right Decubitus  Result Date: 04/21/2019 CLINICAL DATA:  Dyspnea, recent right-sided thoracentesis EXAM: CHEST - RIGHT DECUBITUS; CHEST - LEFT DECUBITUS COMPARISON:  CT 04/16/2019 FINDINGS: Bilateral decubitus views of the chest were obtained. Small to moderate right-sided pleural effusion with small loculated component at the right upper lobe. Trace layering left-sided pleural effusion without loculated component. There is no pneumothorax. Chronic consolidation in the right upper lobe. Fibrotic changes throughout both lungs. Stable heart size. IMPRESSION: 1. Negative for pneumothorax. 2. Small to moderate right-sided pleural effusion with small loculated component at the right upper lobe. 3. Trace layering left-sided pleural  effusion without loculated component. Electronically Signed   By: Davina Poke D.O.   On: 04/21/2019 11:17   DG Chest Left Decubitus  Result Date: 04/21/2019 CLINICAL DATA:  Dyspnea, recent right-sided thoracentesis EXAM: CHEST - RIGHT DECUBITUS; CHEST - LEFT DECUBITUS COMPARISON:  CT 04/16/2019 FINDINGS: Bilateral decubitus views of the chest were obtained. Small to moderate right-sided pleural effusion with small loculated component at the right upper lobe. Trace layering left-sided pleural effusion without loculated component. There is no pneumothorax. Chronic consolidation in the right upper lobe. Fibrotic changes throughout both lungs. Stable heart size. IMPRESSION: 1. Negative for pneumothorax. 2. Small to moderate right-sided pleural effusion with small loculated component at the right upper lobe. 3. Trace layering left-sided pleural effusion without loculated component. Electronically Signed   By: Davina Poke D.O.   On: 04/21/2019 11:17   DG Chest Port 1 View  Result Date: 05/11/2019 CLINICAL DATA:  Shortness of breath EXAM: PORTABLE CHEST 1 VIEW COMPARISON:  May 02, 2019 chest radiograph as well as chest radiograph and chest CT April 28, 2019 FINDINGS: There remains underlying fibrosis bilaterally. There is consolidation with volume loss in the right upper lobe, similar to recent studies. There is patchy ill-defined opacity throughout the left mid and lower lung zones  and right base, likely foci of multifocal pneumonia superimposed on fibrosis. There is a fairly small pleural effusion on each side. No new opacity evident compared to recent studies. Heart size is within normal limits. There is distortion of pulmonary vascularity on the right. The pulmonary vascularity on the left is grossly normal. No adenopathy is appreciable by radiography. No focal bone lesions. IMPRESSION: Underlying fibrosis. Consolidation right upper lobe with volume loss, stable. Suspect patchy pneumonia elsewhere,  stable from recent studies. Small pleural effusion bilaterally. Stable cardiac silhouette. Electronically Signed   By: Lowella Grip III M.D.   On: 05/11/2019 11:40   DG Chest Portable 1 View  Result Date: 04/28/2019 CLINICAL DATA:  Shortness of breath. EXAM: PORTABLE CHEST 1 VIEW COMPARISON:  04/27/2019 FINDINGS: The cardiac silhouette, mediastinal and hilar contours are within normal limits and stable. Stable tortuosity of the thoracic aorta. Stable underlying chronic lung disease with emphysema and pulmonary scarring. Persistent superimposed bilateral infiltrates with persistent right upper lobe airspace consolidation and air bronchograms. Small right pleural effusion. No pneumothorax. IMPRESSION: Stable appearing underlying chronic lung disease/emphysema and pulmonary scarring with superimposed infiltrates and small right effusion. Electronically Signed   By: Marijo Sanes M.D.   On: 04/28/2019 10:56   DG Chest Portable 1 View  Result Date: 04/27/2019 CLINICAL DATA:  Shortness of breath EXAM: PORTABLE CHEST 1 VIEW COMPARISON:  04/22/2019 FINDINGS: Increased density at the right lung base. Stable right pleural effusion. Stable fibrotic changes. Cardiomediastinal contours and volume loss in the right chest are unchanged. IMPRESSION: Increased patchy atelectasis/consolidation at the right lung base. Otherwise stable appearance. Electronically Signed   By: Macy Mis M.D.   On: 04/27/2019 14:40   DG Chest Port 1 View  Result Date: 04/22/2019 CLINICAL DATA:  Right-sided pleural effusion EXAM: PORTABLE CHEST 1 VIEW COMPARISON:  04/17/2019 FINDINGS: Stable cardiomediastinal contours. Chronic consolidation within the right upper lobe. Fibrotic changes throughout both lungs. No significant pleural fluid collection is seen on frontal view. No pneumothorax IMPRESSION: No pneumothorax status post thoracentesis. Electronically Signed   By: Davina Poke D.O.   On: 04/22/2019 13:06   DG Chest Port 1  View  Result Date: 04/17/2019 CLINICAL DATA:  Right-sided thoracentesis EXAM: PORTABLE CHEST 1 VIEW COMPARISON:  Yesterday FINDINGS: Interstitial and airspace opacity in the right upper lobe with volume loss. Normal heart size and stable mediastinal contours. No visible pneumothorax. Trace left pleural effusion. IMPRESSION: 1. No complicating features after thoracentesis. 2. Emphysema and stable right upper lobe opacity with volume loss. Electronically Signed   By: Monte Fantasia M.D.   On: 04/17/2019 11:17   DG Chest Portable 1 View  Result Date: 04/16/2019 CLINICAL DATA:  Shortness of breath EXAM: PORTABLE CHEST 1 VIEW COMPARISON:  April 08, 2019 chest radiograph and chest CT Jul 05 2019 FINDINGS: Extensive fibrosis is noted throughout the lungs bilaterally. There may be a degree of superimposed interstitial edema given increase in interstitial thickening compared to recent CT examination. There is equivocal increase in interstitial thickening bilaterally compared to most recent chest radiograph. There may be patchy airspace opacity intermingled with interstitial prominence. There remains extensive consolidation in the right upper lobe. Heart size and pulmonary vascularity are stable and within normal limits given the underlying fibrosis. No adenopathy is appreciable by radiography. No bone lesions. IMPRESSION: Persistent extensive fibrosis with suspected superimposed interstitial edema and possible interspersed airspace opacity. The degree of ill-defined opacity, primarily in the left lower lobe is modestly increased compared to most recent chest radiograph and  is definitely increased compared to most recent CT. Consolidation remains in the right upper lobe. Stable cardiac silhouette. Electronically Signed   By: Lowella Grip III M.D.   On: 04/16/2019 14:43   Pulmonary Function Test [facility] Only  Result Date: 04/23/2019 I DO NOT SEE PFT's for this encounter  US THORACENTESIS ASP PLEURAL SPACE  W/IMG GUIDE  Result Date: 04/22/2019 INDICATION: 64 year old male with recurrent right-sided pleural effusion. EXAM: ULTRASOUND GUIDED RIGHT THORACENTESIS MEDICATIONS: None. COMPLICATIONS: None immediate. PROCEDURE: An ultrasound guided thoracentesis was thoroughly discussed with the patient and questions answered. The benefits, risks, alternatives and complications were also discussed. The patient understands and wishes to proceed with the procedure. Written consent was obtained. Ultrasound was performed to localize and mark an adequate pocket of fluid in the right chest. The area was then prepped and draped in the normal sterile fashion. 1% Lidocaine was used for local anesthesia. Under ultrasound guidance a 6 Fr Safe-T-Centesis catheter was introduced. Thoracentesis was performed. The catheter was removed and a dressing applied. FINDINGS: A total of approximately 450 mL of yellow pleural fluid was removed. Samples were sent to the laboratory if requested by the clinical team. IMPRESSION: Successful ultrasound guided right thoracentesis yielding 450 mL of pleural fluid. Electronically Signed   By: Jacqulynn Cadet M.D.   On: 04/22/2019 17:08   US THORACENTESIS ASP PLEURAL SPACE W/IMG GUIDE  Result Date: 04/17/2019 INDICATION: Patient with history of COPD, shortness of breath, right-sided pleural effusion. Request is made for diagnostic and therapeutic right thoracentesis. EXAM: ULTRASOUND GUIDED RIGHT THORACENTESIS MEDICATIONS: 10 mL 2% nesacaine Patient with documented allergy to lidocaine, recent tolerance of nesacaine injection 10/25/91 COMPLICATIONS: None immediate. PROCEDURE: An ultrasound guided thoracentesis was thoroughly discussed with the patient and questions answered. The benefits, risks, alternatives and complications were also discussed. The patient understands and wishes to proceed with the procedure. Written consent was obtained. Ultrasound was performed to localize and mark an adequate  pocket of fluid in the right chest. The area was then prepped and draped in the normal sterile fashion. 1% Lidocaine was used for local anesthesia. Under ultrasound guidance a 6 Fr Safe-T-Centesis catheter was introduced. Thoracentesis was performed. The catheter was removed and a dressing applied. FINDINGS: A total of approximately 700 mL of yellow, clear fluid was removed. Samples were sent to the laboratory as requested by the clinical team. IMPRESSION: Successful ultrasound guided right thoracentesis yielding 700 mL of pleural fluid. Read by: Brynda Greathouse PA-C Electronically Signed   By: Aletta Edouard M.D.   On: 04/17/2019 11:26   1. Left ventricular ejection fraction, by estimation, is 35 to 40%. The  left ventricle has moderately decreased function. The left ventrical has  no regional wall motion abnormalities. The left ventricular internal  cavity size was mildly to moderately  dilated. Left ventricular diastolic parameters are indeterminate.  2. Right ventricular systolic function is normal. The right ventricular  size is normal.  3. No left atrial/left atrial appendage thrombus was detected.  4. The mitral valve is normal in structure and function. Mild mitral  valve regurgitation. No evidence of mitral stenosis.  5. The aortic valve is normal in structure and function. Aortic valve  regurgitation is not visualized. Mild to moderate aortic valve stenosis.  6. The inferior vena cava is normal in size with greater than 50%  respiratory variability, suggesting right atrial pressure of 3 mmHg.  Assessment:   NICKEY KLOEPFER is a 64 y.o. 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 Has frequent readmissions for sob, and cough. He also has CHF with EF 35-40 %. BNP  This admit was > 3500.  I suspect he does not have an acute pneumoia currently but rather combo of his CHF, COPD and destroyed lungs.   He has  unrealistic expectations about his disease. Tried to explain it will take many months of MAC treatment before he sees any response. In meantime when admitted like this we can rule out bacterial superinfection with sputum cultures. Can make sure   Recommendations Dc vanco Cont cefepime for today and if sputum cx unimpressive can stop as PC nml Cont MAI treatment. Cont treatment of CHF and COPD Thank you very much for allowing me to participate in the care of this patient. Please call with questions.   Cheral Marker. Ola Spurr, MD

## 2019-05-12 NOTE — Progress Notes (Signed)
Patient is currently followed by [facility] community [facility]iative program. Donella Stade Doran Clay notified. Flo Shanks BSN, RN, [place] [phone]

## 2019-05-12 NOTE — Consult Note (Signed)
Pharmacy Antibiotic Note  Joseph Peters is a 64 y.o. male admitted on 05/11/2019 with pneumonia. PMH includes hypertension, hyperlipidemia, COPD on 3 L nasal cannula oxygen at home, asthma, stroke, GERD, depression, anxiety, RLS, CAD, CHF with EF 35-40%, IBS, alcohol abuse, tobacco abuse, AAA, BPH, MAC infection (he is on ethambutol and rifampin outpatient which have been re-ordered).  Pharmacy has been consulted for cefepime and vancomycin dosing. He has a penicillin allergy but has tolerated cephalosporins in the recent past when he was admitted to [facility]. His SCr is slightly above his baseline of approximately 1.0 mg/dL  MRSA PCR negative   Plan: Day 2 antibiotics.  1: MRSA PCR (-). Vancomycin Dcd per ID  2: Continue cefepime 2 grams IV every 12 hours  Height: _0  (165.1 cm) Weight: 101 lb (45.8 kg) IBW/kg (Calculated) : 61.5  Temp (24hrs), Avg:98 F (36.7 C), Min:97.6 F (36.4 C), Max:98.4 F (36.9 C)  Recent Labs  Lab 05/11/19 1134 05/11/19 1134 05/11/19 1330 05/11/19 2024 05/11/19 2228 05/12/19 0241 05/12/19 0531  WBC 12.1*  --   --   --   --   --   --   CREATININE 1.09  --   --   --   --  1.08  --   LATICACIDVEN 2.5*   < > 3.8* 5.3* 2.9* 2.5* 2.7*   < > = values in this interval not displayed.    Estimated Creatinine Clearance: 45.9 mL/min (by C-G formula based on SCr of 1.08 mg/dL).    Allergies  Allergen Reactions  . Penicillins Swelling, Rash and Other (See Comments)    Swelling in throat  Did it involve swelling of the face/tongue/throat, SOB, or low BP? yes Did it involve sudden or severe rash/hives, skin peeling, or any reaction on the inside of your mouth or nose? no Did you need to seek medical attention at a hospital or doctor's office? Yes When did it last happen?years  If all above answers are "NO", may proceed with cephalosporin use.    . Lidocaine Other (See Comments)    Unsure  At dentist's office    Antimicrobials this  admission: levofloxacin x 1 vancomycin 3/15 >> 3/16 cefepime 3/15 >>   Microbiology results: 3/15 BCx: NG TD 3/15 Sputum: pending  3/15 MRSA PCR: negative   Thank you for allowing pharmacy to be a part of this patient's care.  Pernell Dupre, PharmD, BCPS Clinical Pharmacist 05/12/2019 8:33 AM

## 2019-05-12 NOTE — Progress Notes (Signed)
PROGRESS NOTE    Joseph Peters  BSJ:[id number] DOB: 01-30-1956 DOA: 05/11/2019 PCP: Barbaraann Boys, MD       Assessment & Plan:   Principal Problem:   Acute on chronic respiratory failure with hypoxia ([place]) Active Problems:   Benign hypertension   COPD exacerbation ([place])   Essential hypertension   Protein-calorie malnutrition, severe   Gastroesophageal reflux disease without esophagitis   History of MAC infection   Acute on chronic systolic CHF (congestive heart failure) ([facility])   Elevated troponin   HCAP (healthcare-associated pneumonia)   Sepsis ([place])   CAD (coronary artery disease)   BPH (benign prostatic hyperplasia)   Depression   Stroke ([place])  Acute on chronic hypoxic respiratory failure: Likely due to multifactorial etiology, including COPD exacerbation, possible HCAP, CHF exacerbation and history of MAC infection & w/ underlying fibrosis. Continue on IV cefepime, rifampin, ethambutol & vano as per ID.   Possible HCAP: continue on IV vancomycin and cefepime as per ID. Continue on bronchodilators. Encourage incentive spirometry. Legionella, strep are pending. Blood cxs NGTD  Lactic acidosis: repeat lactic acid ordered.   Leukocytosis: likely secondary to infection. Continue on IV abxs.   COPD exacerbation: w/ underlying fibrosis as per CXR. Continue on singulair, steroids. Continue w/ bronchodilators   History of MAC infection: continue ethambutol and rifampin as per ID  Sepsis: secondary to above. Management as stated above  Acute on chronic systolic CHF: echo on 09/01/5463 showed EF 35-40%. S/p lasix x1 in ER.   HTN: continue on metoprolol  Hyperlipidemia: continue on statin  GERD: continue on PPI  Elevated troponin & hx of CAD: continue on statin, metoprolol, aspirin  Chest pain: likely secondary to pneumonia/MAC. CTA chest was neg for PE  BPH: continue flomax and proscar  Depression:severity unknown. Continue on mirtazapine, fluoxetine     Protein-calorie malnutrition, severe: BMI 16.81. Continue w/ nutritional supplements   Hx of stroke: continue on aspirin, crestor    DVT prophylaxis:  Heparin  Code Status: full  Family Communication:  Disposition Plan:    Consultants:   ID   Procedures:    Antimicrobials: vanco, cefepime, rifampin, ethambutol   Subjective: Pt c/o shortness of breath  Objective: Vitals:   05/11/19 2256 05/11/19 2326 05/11/19 2357 05/12/19 0500  BP: 100/65  124/71   Pulse:  89 (!) 107 92  Resp:  (!) 22 (!) 24 18  Temp: 98.4 F (36.9 C)  97.6 F (36.4 C)   TempSrc: Oral  Oral   SpO2: 100% 92% 91% 96%  Weight:      Height:        Intake/Output Summary (Last 24 hours) at 05/12/2019 0721 Last data filed at 05/12/2019 0600 Gross per 24 hour  Intake 352.67 ml  Output 1050 ml  Net -697.33 ml   Filed Weights   05/11/19 1121  Weight: 45.8 kg    Examination:  General exam: Appears calm and comfortable  Respiratory system: Course breath sounds b/l Cardiovascular system: S1 & S2 +. No rubs, gallops or clicks.  Gastrointestinal system: Abdomen is nondistended, soft and nontender. Normal bowel sounds heard. Central nervous system: Alert and oriented. Moves all 4 extremities Psychiatry: Judgement and insight appear normal. Flat mood and affect.     Data Reviewed: I have personally reviewed following labs and imaging studies  CBC: Recent Labs  Lab 05/11/19 1134  WBC 12.1*  NEUTROABS 9.4*  HGB 14.3  HCT 44.7  MCV 89.6  PLT 035   Basic Metabolic Panel: Recent Labs  Lab 05/11/19 1134 05/12/19 0241  NA 135  --   K 3.7  --   CL 97*  --   CO2 23  --   GLUCOSE 104*  --   BUN 9  --   CREATININE 1.09 1.08  CALCIUM 9.4  --    GFR: Estimated Creatinine Clearance: 45.9 mL/min (by C-G formula based on SCr of 1.08 mg/dL). Liver Function Tests: No results for input(s): AST, ALT, ALKPHOS, BILITOT, PROT, ALBUMIN in the last 168 hours. No results for input(s): LIPASE,  AMYLASE in the last 168 hours. No results for input(s): AMMONIA in the last 168 hours. Coagulation Profile: No results for input(s): INR, PROTIME in the last 168 hours. Cardiac Enzymes: No results for input(s): CKTOTAL, CKMB, CKMBINDEX, TROPONINI in the last 168 hours. BNP (last 3 results) No results for input(s): PROBNP in the last 8760 hours. HbA1C: No results for input(s): HGBA1C in the last 72 hours. CBG: No results for input(s): GLUCAP in the last 168 hours. Lipid Profile: No results for input(s): CHOL, HDL, LDLCALC, TRIG, CHOLHDL, LDLDIRECT in the last 72 hours. Thyroid Function Tests: No results for input(s): TSH, T4TOTAL, FREET4, T3FREE, THYROIDAB in the last 72 hours. Anemia Panel: No results for input(s): VITAMINB12, FOLATE, FERRITIN, TIBC, IRON, RETICCTPCT in the last 72 hours. Sepsis Labs: Recent Labs  Lab 05/11/19 1330 05/11/19 2021 05/11/19 2024 05/11/19 2228 05/12/19 0241 05/12/19 0531  PROCALCITON  --  <0.10  --   --   --   --   LATICACIDVEN   < >  --  5.3* 2.9* 2.5* 2.7*   < > = values in this interval not displayed.    Recent Results (from the past 240 hour(s))  Respiratory Panel by RT PCR (Flu A&B, Covid) - Nasopharyngeal Swab     Status: None   Collection Time: 05/11/19 11:24 AM   Specimen: Nasopharyngeal Swab  Result Value Ref Range Status   SARS Coronavirus 2 by RT PCR NEGATIVE NEGATIVE Final    Comment: (NOTE) SARS-CoV-2 target nucleic acids are NOT DETECTED. The SARS-CoV-2 RNA is generally detectable in upper respiratoy specimens during the acute phase of infection. The lowest concentration of SARS-CoV-2 viral copies this assay can detect is 131 copies/mL. A negative result does not preclude SARS-Cov-2 infection and should not be used as the sole basis for treatment or other patient management decisions. A negative result may occur with  improper specimen collection/handling, submission of specimen other than nasopharyngeal swab, presence of  viral mutation(s) within the areas targeted by this assay, and inadequate number of viral copies (<131 copies/mL). A negative result must be combined with clinical observations, patient history, and epidemiological information. The expected result is Negative. Fact Sheet for Patients:  PinkCheek.be Fact Sheet for Healthcare Providers:  GravelBags.it This test is not yet ap proved or cleared by the [place] FDA and  has been authorized for detection and/or diagnosis of SARS-CoV-2 by FDA under an Emergency Use Authorization (EUA). This EUA will remain  in effect (meaning this test can be used) for the duration of the COVID-19 declaration under Section 564(b)(1) of the Act, 21 U.S.C. section 360bbb-3(b)(1), unless the authorization is terminated or revoked sooner.    Influenza A by PCR NEGATIVE NEGATIVE Final   Influenza B by PCR NEGATIVE NEGATIVE Final    Comment: (NOTE) The Xpert Xpress SARS-CoV-2/FLU/RSV assay is intended as an aid in  the diagnosis of influenza from Nasopharyngeal swab specimens and  should not be used as a sole basis  for treatment. Nasal washings and  aspirates are unacceptable for Xpert Xpress SARS-CoV-2/FLU/RSV  testing. Fact Sheet for Patients: PinkCheek.be Fact Sheet for Healthcare Providers: GravelBags.it This test is not yet approved or cleared by the [place] FDA and  has been authorized for detection and/or diagnosis of SARS-CoV-2 by  FDA under an Emergency Use Authorization (EUA). This EUA will remain  in effect (meaning this test can be used) for the duration of the  Covid-19 declaration under Section 564(b)(1) of the Act, 21  U.S.C. section 360bbb-3(b)(1), unless the authorization is  terminated or revoked. Performed at [facility], [address] [place], [place] [address]   MRSA PCR Screening     Status: None    Collection Time: 05/11/19  8:19 PM   Specimen: Nasopharyngeal  Result Value Ref Range Status   MRSA by PCR NEGATIVE NEGATIVE Final    Comment:        The GeneXpert MRSA Assay (FDA approved for NASAL specimens only), is one component of a comprehensive MRSA colonization surveillance program. It is not intended to diagnose MRSA infection nor to guide or monitor treatment for MRSA infections. Performed at [facility], [address] [place], [place] [address]          Radiology Studies: CT ANGIO CHEST PE W OR WO CONTRAST  Result Date: 05/11/2019 CLINICAL DATA:  Shortness of breath and cough.  Worsening dyspnea. EXAM: CT ANGIOGRAPHY CHEST WITH CONTRAST TECHNIQUE: Multidetector CT imaging of the chest was performed using the standard protocol during bolus administration of intravenous contrast. Multiplanar CT image reconstructions and MIPs were obtained to evaluate the vascular anatomy. CONTRAST:  40m OMNIPAQUE IOHEXOL 350 MG/ML SOLN COMPARISON:  04/28/2019 FINDINGS: Cardiovascular: The heart size is normal. No substantial pericardial effusion. Coronary artery calcification is evident. Ascending thoracic aorta measures 4.3 cm diameter. Atherosclerotic calcification is noted in the wall of the thoracic aorta. No filling defect within the opacified pulmonary arteries to suggest the presence of an acute pulmonary embolus. Mediastinum/Nodes: Scattered small mediastinal lymph nodes again noted. No left hilar adenopathy. Stable soft tissue attenuation in the right hilar region. The esophagus has normal imaging features. There is no axillary lymphadenopathy. Lungs/Pleura: Centrilobular and paraseptal emphysema evident. The right upper lobe collapse/consolidative opacity is stable with cavitary component visible on 28/7. Similar architectural distortion, scarring, and airspace disease in the right lower lobe, lingula, and left lower lobe. Small bilateral pleural effusions again noted. Upper  Abdomen: Mild fullness noted left upper pole renal collecting system although renal pelvis and entire left kidney is not been visualized. Musculoskeletal: No worrisome lytic or sclerotic osseous abnormality. Mild inferior endplate compression deformity of a midthoracic vertebral body is stable. Review of the MIP images confirms the above findings. IMPRESSION: 1. No CT evidence for acute pulmonary embolus. 2. Similar appearance of the bilateral airspace disease with confluent consolidative change in the right lung apex demonstrating small cavitary component. Imaging features remain suspicious for multifocal pneumonia although neoplasm cannot be excluded. 3. Persistent small bilateral pleural effusions. 4. Ascending thoracic aorta measures 4.3 cm diameter. Recommend annual imaging followup by CTA or MRA. This recommendation follows 2010 ACCF/AHA/AATS/ACR/ASA/SCA/SCAI/SIR/STS/SVM Guidelines for the Diagnosis and Management of Patients with Thoracic Aortic Disease. Circulation. 2010; 121:: O032-Z224 Aortic aneurysm NOS (ICD10-I71.9) 5.  Emphysema (ICD10-J43.9) and Aortic Atherosclerosis (ICD10-170.0) Electronically Signed   By: EMisty StanleyM.D.   On: 05/11/2019 16:31   DG Chest Port 1 View  Result Date: 05/11/2019 CLINICAL DATA:  Shortness of breath EXAM: PORTABLE  CHEST 1 VIEW COMPARISON:  May 02, 2019 chest radiograph as well as chest radiograph and chest CT April 28, 2019 FINDINGS: There remains underlying fibrosis bilaterally. There is consolidation with volume loss in the right upper lobe, similar to recent studies. There is patchy ill-defined opacity throughout the left mid and lower lung zones and right base, likely foci of multifocal pneumonia superimposed on fibrosis. There is a fairly small pleural effusion on each side. No new opacity evident compared to recent studies. Heart size is within normal limits. There is distortion of pulmonary vascularity on the right. The pulmonary vascularity on the left is  grossly normal. No adenopathy is appreciable by radiography. No focal bone lesions. IMPRESSION: Underlying fibrosis. Consolidation right upper lobe with volume loss, stable. Suspect patchy pneumonia elsewhere, stable from recent studies. Small pleural effusion bilaterally. Stable cardiac silhouette. Electronically Signed   By: Lowella Grip III M.D.   On: 05/11/2019 11:40        Scheduled Meds: . aspirin EC  81 mg Oral Daily  . dextromethorphan-guaiFENesin  1 tablet Oral BID  . ethambutol  800 mg Oral Daily  . feeding supplement (ENSURE ENLIVE)  237 mL Oral BID BM  . finasteride  5 mg Oral Daily  . FLUoxetine  20 mg Oral Daily  . fluticasone furoate-vilanterol  1 puff Inhalation Daily  . folic acid  1 mg Oral Daily  . gabapentin  900 mg Oral QHS  . heparin  5,000 Units Subcutaneous Q8H  . ipratropium-albuterol  3 mL Nebulization Q4H  . mouth rinse  15 mL Mouth Rinse BID  . methylPREDNISolone (SOLU-MEDROL) injection  40 mg Intravenous Q12H  . metoprolol succinate  12.5 mg Oral Daily  . mirtazapine  7.5 mg Oral QHS  . montelukast  10 mg Oral QHS  . multivitamin with minerals  1 tablet Oral Daily  . nicotine  21 mg Transdermal Q24H  . pantoprazole  40 mg Oral Daily  . rifampin  600 mg Oral Daily  . roflumilast  250 mcg Oral QHS  . rosuvastatin  5 mg Oral QHS  . tamsulosin  0.4 mg Oral QPC supper  . thiamine  100 mg Oral Daily  . umeclidinium bromide  1 puff Inhalation Daily   Continuous Infusions: . ceFEPime (MAXIPIME) IV Stopped (05/11/19 2322)  . vancomycin       LOS: 1 day    Time spent: 33 mins   Wyvonnia Dusky, MD [facility] Pager [phone]  If 7PM-7AM, please contact night-coverage [email or web address] 05/12/2019, 7:21 AM

## 2019-05-12 NOTE — TOC Initial Note (Signed)
Transition of Care [facility]) - Initial/Assessment Note    Patient Details  Name: Joseph Peters MRN: [id number] Date of Birth: [birth year]  Transition of Care [facility]) CM/SW Contact:    Shelbie Hutching, RN Phone Number: 05/12/2019, 1:49 PM  Clinical Narrative:                 Patient admitted with Dyspnea and chronic respiratory failure, patient is on chronic oxygen 3L [place] at home.  Patient is from home with his wife.  Patient is open with [facility] for RN and PT, Floydene Flock with [facility] is aware of admission.  Patient reports that he has all needed DME at home including [employer] and aflovest.   Patient is followed by outpatient palliative through [facility].  Flo Shanks with [facility] is aware of admission.  RNCM will cont to follow and assist with discharge planning.   Expected Discharge Plan: [place] Barriers to Discharge: Continued Medical Work up   Patient Goals and CMS Choice   CMS Medicare.gov Compare Post Acute Care list provided to:: Patient Choice offered to / list presented to : Patient  Expected Discharge Plan and Services Expected Discharge Plan: [place]   Discharge Planning Services: CM Consult Post Acute Care Choice: Resumption of Svcs/PTA Provider Living arrangements for the past 2 months: [place] Arranged: RN, PT [facility] Agency: [place] ([facility]) Date [place]: 05/12/19 Time [place]: 6606 Representative spoke with at [place]: Floydene Flock  Prior Living Arrangements/Services Living arrangements for the past 2 months: [place] with:: Spouse Patient language and need for interpreter reviewed:: Yes Do you feel safe going back to the place where you live?: Yes      Need for Family Participation in Patient Care: Yes (Comment)(COPD, MAC) Care giver support system in place?: Yes (comment)(wife) Current home  services: DME, Home PT, Home RN Criminal Activity/Legal Involvement Pertinent to Current Situation/Hospitalization: No - Comment as needed  Activities of Daily Living Home Assistive Devices/Equipment: Eyeglasses, Cane (specify quad or straight), Shower chair with back, Wheelchair, Environmental consultant (specify type), Nebulizer, Oxygen, Blood pressure cuff ADL Screening (condition at time of admission) Patient's cognitive ability adequate to safely complete daily activities?: Yes Is the patient deaf or have difficulty hearing?: No Does the patient have difficulty seeing, even when wearing glasses/contacts?: No Does the patient have difficulty concentrating, remembering, or making decisions?: No Patient able to express need for assistance with ADLs?: Yes Does the patient have difficulty dressing or bathing?: No Independently performs ADLs?: Yes (appropriate for developmental age) Does the patient have difficulty walking or climbing stairs?: Yes Weakness of Legs: Both Weakness of Arms/Hands: Both  Permission Sought/Granted Permission sought to share information with : Case Manager, Other (comment) Permission granted to share information with : Yes, Verbal Permission Granted     Permission granted to share info w AGENCY: [facility]        Emotional Assessment Appearance:: Appears stated age Attitude/Demeanor/Rapport: Engaged Affect (typically observed): Accepting Orientation: : Oriented to Self, Oriented to Place, Oriented to  Time, Oriented to Situation Alcohol / Substance Use: Not Applicable Psych Involvement: No (comment)  Admission diagnosis:  HCAP (healthcare-associated pneumonia) [J18.9] Chronic respiratory failure, unspecified whether with hypoxia or hypercapnia (HCC) [J96.10] Dyspnea, unspecified type [R06.00] Patient  Active Problem List   Diagnosis Date Noted  . Acute on chronic systolic CHF (congestive heart failure) ([place]) 04/28/2019  . Elevated troponin 04/28/2019  . HCAP  (healthcare-associated pneumonia) 04/28/2019  . Sepsis ([place]) 04/28/2019  . CAD (coronary artery disease) 04/28/2019  . BPH (benign prostatic hyperplasia) 04/28/2019  . Depression 04/28/2019  . History of MAC infection   . Pleural effusion on right   . SOB (shortness of breath)   . Anxiety about health   . Dyspnea   . Acute on chronic respiratory failure with hypoxia ([place])   . Goals of care, counseling/discussion   . Advanced care planning/counseling discussion   . Palliative care by specialist   . Restless legs syndrome (RLS) 04/01/2019  . Protein-calorie malnutrition, severe 10/15/2018  . Nodule of upper lobe of right lung 10/15/2018  . RLQ abdominal pain 10/14/2018  . Status post total hip replacement, right 07/01/2018  . Strain of right knee 06/26/2018  . Lumbar strain 06/16/2018  . Strain of right hip 06/16/2018  . Hyperlipidemia 01/30/2018  . Rotator cuff tendinitis, right 08/19/2017  . Traumatic complete tear of right rotator cuff 08/19/2017  . Ascending aortic aneurysm ([place]) 08/01/2017  . Tremor, essential 05/28/2017  . Gastroesophageal reflux disease without esophagitis 12/25/2016  . Renal artery stenosis ([place]) 12/05/2015  . 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 Syncope and collapse 12/05/2015  . Essential hypertension 10/15/2015  . COPD exacerbation ([place]) 01/31/2015  . Acute respiratory failure ([place]) 01/31/2015  . Drug allergy 01/30/2015  . AAA (abdominal aortic aneurysm) ([place]) 11/23/2014  . Prostate cancer screening 11/02/2014  . Gross hematuria 11/02/2014  . Benign hypertension 07/16/2014  . 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 Stroke ([place]) 2015  . 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 Occlusion and stenosis of unspecified carotid artery 05/14/2012  . Coccydynia 02/08/2012  . 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 Gait disorder 02/08/2012  . Spinal stenosis of lumbar region 01/13/2009  . Atherosclerotic heart disease of native  coronary artery without angina pectoris 06/28/2008  . Sciatica 06/28/2008  . Recurrent major depressive disorder in partial remission ([place]) 06/28/2008   PCP:  Barbaraann Boys, MD Pharmacy:   [facility] [phone] - [address], [address] AT [address] [place] [address] Phone: [phone] Fax: [phone]     Social Determinants of Health (SDOH) Interventions    Readmission Risk Interventions Readmission Risk Prevention Plan 04/29/2019 04/21/2019  Transportation Screening Complete Complete  PCP or Specialist Appt within 3-5 Days - Complete  HRI or [place] - Complete  Palliative Care Screening - Complete  Medication Review ([place]) Complete Referral to Pharmacy  PCP or Specialist appointment within 3-5 days of discharge Complete -  [place] or Home Care Consult Complete -  Some recent data might be hidden

## 2019-05-13 DIAGNOSIS — J9621 Acute and chronic respiratory failure with hypoxia: Secondary | ICD-10-CM

## 2019-05-13 LAB — URINALYSIS, COMPLETE (UACMP) WITH MICROSCOPIC
Bacteria, UA: NONE SEEN
Bilirubin Urine: NEGATIVE
Glucose, UA: NEGATIVE mg/dL
Hgb urine dipstick: NEGATIVE
Ketones, ur: NEGATIVE mg/dL
Leukocytes,Ua: NEGATIVE
Nitrite: NEGATIVE
Protein, ur: NEGATIVE mg/dL
Specific Gravity, Urine: 1.012 (ref 1.005–1.030)
pH: 7 (ref 5.0–8.0)

## 2019-05-13 LAB — COMPREHENSIVE METABOLIC PANEL
ALT: 10 U/L (ref 0–44)
AST: 13 U/L — ABNORMAL LOW (ref 15–41)
Albumin: 2.8 g/dL — ABNORMAL LOW (ref 3.5–5.0)
Alkaline Phosphatase: 73 U/L (ref 38–126)
Anion gap: 10 (ref 5–15)
BUN: 15 mg/dL (ref 8–23)
CO2: 26 mmol/L (ref 22–32)
Calcium: 8.5 mg/dL — ABNORMAL LOW (ref 8.9–10.3)
Chloride: 102 mmol/L (ref 98–111)
Creatinine, Ser: 0.9 mg/dL (ref 0.61–1.24)
GFR calc Af Amer: >60 mL/min (ref >60)
GFR calc non Af Amer: >60 mL/min (ref >60)
Glucose, Bld: 121 mg/dL — ABNORMAL HIGH (ref 70–99)
Potassium: 4.5 mmol/L (ref 3.5–5.1)
Sodium: 138 mmol/L (ref 135–145)
Total Bilirubin: 0.8 mg/dL (ref 0.3–1.2)
Total Protein: 6.6 g/dL (ref 6.5–8.1)

## 2019-05-13 LAB — CBC
HCT: 33.5 % — ABNORMAL LOW (ref 39.0–52.0)
Hemoglobin: 10.9 g/dL — ABNORMAL LOW (ref 13.0–17.0)
MCH: 29.1 pg (ref 26.0–34.0)
MCHC: 32.5 g/dL (ref 30.0–36.0)
MCV: 89.3 fL (ref 80.0–100.0)
Platelets: 232 10*3/uL (ref 150–400)
RBC: 3.75 MIL/uL — ABNORMAL LOW (ref 4.22–5.81)
RDW: 14.4 % (ref 11.5–15.5)
WBC: 8.2 10*3/uL (ref 4.0–10.5)
nRBC: 0 % (ref 0.0–0.2)

## 2019-05-13 LAB — EXPECTORATED SPUTUM ASSESSMENT W GRAM STAIN, RFLX TO RESP C

## 2019-05-13 LAB — MAGNESIUM: Magnesium: 2.1 mg/dL (ref 1.7–2.4)

## 2019-05-13 LAB — PHOSPHORUS: Phosphorus: 4.3 mg/dL (ref 2.5–4.6)

## 2019-05-13 MED ORDER — BENZONATATE 100 MG PO CAPS
100.0000 mg | ORAL_CAPSULE | Freq: Three times a day (TID) | ORAL | Status: DC | PRN
  Administered 2019-05-14: 100 mg via ORAL
  Filled 2019-05-13: qty 1

## 2019-05-13 MED ORDER — GUAIFENESIN-DM 100-10 MG/5ML PO SYRP
5.0000 mL | ORAL_SOLUTION | ORAL | Status: DC | PRN
  Administered 2019-05-13: 22:00:00 5 mL via ORAL
  Filled 2019-05-13: qty 5

## 2019-05-13 MED ORDER — FUROSEMIDE 10 MG/ML IJ SOLN
40.0000 mg | Freq: Every day | INTRAMUSCULAR | Status: DC
  Administered 2019-05-13 – 2019-05-14 (×2): 40 mg via INTRAVENOUS
  Filled 2019-05-13: qty 4

## 2019-05-13 MED ORDER — IPRATROPIUM-ALBUTEROL 0.5-2.5 (3) MG/3ML IN SOLN
3.0000 mL | Freq: Four times a day (QID) | RESPIRATORY_TRACT | Status: DC
  Administered 2019-05-13 – 2019-05-14 (×5): 3 mL via RESPIRATORY_TRACT
  Filled 2019-05-13 (×5): qty 3

## 2019-05-13 NOTE — Progress Notes (Signed)
PROGRESS NOTE    Joseph Peters  BMW:[id number] DOB: [birth year] DOA: 05/11/2019 PCP: Barbaraann Boys, MD       Assessment & Plan:   Principal Problem:   Acute on chronic respiratory failure with hypoxia ([place]) Active Problems:   Benign hypertension   COPD exacerbation ([place])   Essential hypertension   Protein-calorie malnutrition, severe   Gastroesophageal reflux disease without esophagitis   History of MAC infection   Acute on chronic systolic CHF (congestive heart failure) (HCC)   Elevated troponin   HCAP (healthcare-associated pneumonia)   Sepsis ([place])   CAD (coronary artery disease)   BPH (benign prostatic hyperplasia)   Depression   Stroke ([place])  Acute on chronic hypoxic respiratory failure: Likely due to multifactorial etiology, including COPD exacerbation, possible HCAP, CHF exacerbation and history of MAC infection & w/ underlying fibrosis.  Continue on IV cefepime, rifampin, ethambutol & vano as per ID.   Possible HCAP: continue on IV vancomycin and cefepime as per ID.  Continue on bronchodilators. Encourage incentive spirometry.  Legionella, strep are pending. Blood cxs NGTD  Lactic acidosis: repeat lactic acid ordered.  Trending down 5.3>2.0  Leukocytosis: likely secondary to infection. Continue on IV abxs.   COPD exacerbation: w/ underlying fibrosis as per CXR. Continue on singulair, steroids. Continue w/ bronchodilators  Patient uses oxygen 3 L at home which is baseline  History of MAC infection: continue ethambutol and rifampin as per ID  Sepsis: secondary to above. Management as stated above  Acute on chronic systolic CHF: echo on 04/04/2534 showed EF 35-40%. S/p lasix x1 in ER.  05/13/2019 started Lasix 40 mg IV daily, plan to resume oral Lasix PTA dose on discharge  HTN: continue on metoprolol  Hyperlipidemia: continue on statin  GERD: continue on PPI  Elevated troponin & hx of CAD: continue on statin, metoprolol, aspirin  Chest pain:  likely secondary to pneumonia/MAC. CTA chest was neg for PE  BPH: continue flomax and proscar  Depression:severity unknown. Continue on mirtazapine, fluoxetine    Protein-calorie malnutrition, severe: BMI 16.81. Continue w/ nutritional supplements   Hx of stroke: continue on aspirin, crestor    DVT prophylaxis:  Heparin  Code Status: full  Family Communication:  Disposition Plan: Patient still has mild shortness of breath and wheezing, sputum culture pending ID following still patient is on IV antibiotics cefepime.  Once ID will decide for oral antibiotics and then will plan for disposition in 1 to 2 days  Consultants:   ID   Procedures:    Antimicrobials: vanco, cefepime, rifampin, ethambutol   Subjective: Patient was seen and examined at bedside, no overnight issues.  Patient feels improvement in the shortness of breath.  Still has mild shortness of breath and more work of breathing at rest. Denies any chest pain or palpitations, no abdominal pain no nausea vomiting or diarrhea.  Objective: Vitals:   05/13/19 0045 05/13/19 0100 05/13/19 0137 05/13/19 0729  BP: 127/89   124/87  Pulse: (!) 106 (!) 105  88  Resp: (!) 26 (!) 24 20 (!) 21  Temp: 97.7 F (36.5 C)   (!) 97.4 F (36.3 C)  TempSrc: Oral   Oral  SpO2: 95% 96% 97% 93%  Weight:      Height:        Intake/Output Summary (Last 24 hours) at 05/13/2019 1359 Last data filed at 05/13/2019 1349 Gross per 24 hour  Intake 0 ml  Output 450 ml  Net -450 ml   Filed Weights   05/11/19  1121  Weight: 45.8 kg    Examination:  General exam: Appears calm and comfortable  Respiratory system: Wheezing bilaterally L>R, mild bibasilar crackles Cardiovascular system: S1 & S2 +. No rubs, gallops or clicks.  Gastrointestinal system: Abdomen is nondistended, soft and nontender. Normal bowel sounds heard. Central nervous system: Alert and oriented. Moves all 4 extremities Psychiatry: Judgement and insight appear  normal. Flat mood and affect.     Data Reviewed: I have personally reviewed following labs and imaging studies  CBC: Recent Labs  Lab 05/11/19 1134 05/13/19 0414  WBC 12.1* 8.2  NEUTROABS 9.4*  --   HGB 14.3 10.9*  HCT 44.7 33.5*  MCV 89.6 89.3  PLT 277 814   Basic Metabolic Panel: Recent Labs  Lab 05/11/19 1134 05/12/19 0241 05/13/19 0414  NA 135  --  138  K 3.7  --  4.5  CL 97*  --  102  CO2 23  --  26  GLUCOSE 104*  --  121*  BUN 9  --  15  CREATININE 1.09 1.08 0.90  CALCIUM 9.4  --  8.5*  MG  --   --  2.1  PHOS  --   --  4.3   GFR: Estimated Creatinine Clearance: 55.1 mL/min (by C-G formula based on SCr of 0.9 mg/dL). Liver Function Tests: Recent Labs  Lab 05/13/19 0414  AST 13*  ALT 10  ALKPHOS 73  BILITOT 0.8  PROT 6.6  ALBUMIN 2.8*   No results for input(s): LIPASE, AMYLASE in the last 168 hours. No results for input(s): AMMONIA in the last 168 hours. Coagulation Profile: No results for input(s): INR, PROTIME in the last 168 hours. Cardiac Enzymes: No results for input(s): CKTOTAL, CKMB, CKMBINDEX, TROPONINI in the last 168 hours. BNP (last 3 results) No results for input(s): PROBNP in the last 8760 hours. HbA1C: No results for input(s): HGBA1C in the last 72 hours. CBG: No results for input(s): GLUCAP in the last 168 hours. Lipid Profile: No results for input(s): CHOL, HDL, LDLCALC, TRIG, CHOLHDL, LDLDIRECT in the last 72 hours. Thyroid Function Tests: No results for input(s): TSH, T4TOTAL, FREET4, T3FREE, THYROIDAB in the last 72 hours. Anemia Panel: No results for input(s): VITAMINB12, FOLATE, FERRITIN, TIBC, IRON, RETICCTPCT in the last 72 hours. Sepsis Labs: Recent Labs  Lab 05/11/19 2021 05/11/19 2024 05/11/19 2228 05/12/19 0241 05/12/19 0531 05/12/19 1238  PROCALCITON <0.10  --   --   --   --   --   LATICACIDVEN  --    < > 2.9* 2.5* 2.7* 2.0*   < > = values in this interval not displayed.    Recent Results (from the past  240 hour(s))  Respiratory Panel by RT PCR (Flu A&B, Covid) - Nasopharyngeal Swab     Status: None   Collection Time: 05/11/19 11:24 AM   Specimen: Nasopharyngeal Swab  Result Value Ref Range Status   SARS Coronavirus 2 by RT PCR NEGATIVE NEGATIVE Final    Comment: (NOTE) SARS-CoV-2 target nucleic acids are NOT DETECTED. The SARS-CoV-2 RNA is generally detectable in upper respiratoy specimens during the acute phase of infection. The lowest concentration of SARS-CoV-2 viral copies this assay can detect is 131 copies/mL. A negative result does not preclude SARS-Cov-2 infection and should not be used as the sole basis for treatment or other patient management decisions. A negative result may occur with  improper specimen collection/handling, submission of specimen other than nasopharyngeal swab, presence of viral mutation(s) within the areas targeted by  this assay, and inadequate number of viral copies (<131 copies/mL). A negative result must be combined with clinical observations, patient history, and epidemiological information. The expected result is Negative. Fact Sheet for Patients:  PinkCheek.be Fact Sheet for Healthcare Providers:  GravelBags.it This test is not yet ap proved or cleared by the [place] FDA and  has been authorized for detection and/or diagnosis of SARS-CoV-2 by FDA under an Emergency Use Authorization (EUA). This EUA will remain  in effect (meaning this test can be used) for the duration of the COVID-19 declaration under Section 564(b)(1) of the Act, 21 U.S.C. section 360bbb-3(b)(1), unless the authorization is terminated or revoked sooner.    Influenza A by PCR NEGATIVE NEGATIVE Final   Influenza B by PCR NEGATIVE NEGATIVE Final    Comment: (NOTE) The Xpert Xpress SARS-CoV-2/FLU/RSV assay is intended as an aid in  the diagnosis of influenza from Nasopharyngeal swab specimens and  should not be used  as a sole basis for treatment. Nasal washings and  aspirates are unacceptable for Xpert Xpress SARS-CoV-2/FLU/RSV  testing. Fact Sheet for Patients: PinkCheek.be Fact Sheet for Healthcare Providers: GravelBags.it This test is not yet approved or cleared by the [place] FDA and  has been authorized for detection and/or diagnosis of SARS-CoV-2 by  FDA under an Emergency Use Authorization (EUA). This EUA will remain  in effect (meaning this test can be used) for the duration of the  Covid-19 declaration under Section 564(b)(1) of the Act, 21  U.S.C. section 360bbb-3(b)(1), unless the authorization is  terminated or revoked. Performed at [facility], [address] [place], [place] [address]   Blood culture (routine x 2)     Status: None (Preliminary result)   Collection Time: 05/11/19 11:34 AM   Specimen: BLOOD  Result Value Ref Range Status   Specimen Description BLOOD RIGHT [facility]  Final   Special Requests   Final    BOTTLES DRAWN AEROBIC AND ANAEROBIC Blood Culture results may not be optimal due to an inadequate volume of blood received in culture bottles   Culture   Final    NO GROWTH 2 DAYS Performed at [facility], [address], [place], [place] [address]    Report Status PENDING  Incomplete  Blood culture (routine x 2)     Status: None (Preliminary result)   Collection Time: 05/11/19 11:35 AM   Specimen: BLOOD  Result Value Ref Range Status   Specimen Description BLOOD RIGHT ARM  Final   Special Requests   Final    BOTTLES DRAWN AEROBIC AND ANAEROBIC Blood Culture results may not be optimal due to an inadequate volume of blood received in culture bottles   Culture   Final    NO GROWTH 2 DAYS Performed at [facility], [address] [place], [place] [address]    Report Status PENDING  Incomplete  MRSA PCR Screening     Status: None   Collection Time: 05/11/19  8:19 PM    Specimen: Nasopharyngeal  Result Value Ref Range Status   MRSA by PCR NEGATIVE NEGATIVE Final    Comment:        The GeneXpert MRSA Assay (FDA approved for NASAL specimens only), is one component of a comprehensive MRSA colonization surveillance program. It is not intended to diagnose MRSA infection nor to guide or monitor treatment for MRSA infections. Performed at [facility], [address] [place], [place] [address]          Radiology Studies:  CT ANGIO CHEST PE W OR WO CONTRAST  Result Date: 05/11/2019 CLINICAL DATA:  Shortness of breath and cough.  Worsening dyspnea. EXAM: CT ANGIOGRAPHY CHEST WITH CONTRAST TECHNIQUE: Multidetector CT imaging of the chest was performed using the standard protocol during bolus administration of intravenous contrast. Multiplanar CT image reconstructions and MIPs were obtained to evaluate the vascular anatomy. CONTRAST:  61m OMNIPAQUE IOHEXOL 350 MG/ML SOLN COMPARISON:  04/28/2019 FINDINGS: Cardiovascular: The heart size is normal. No substantial pericardial effusion. Coronary artery calcification is evident. Ascending thoracic aorta measures 4.3 cm diameter. Atherosclerotic calcification is noted in the wall of the thoracic aorta. No filling defect within the opacified pulmonary arteries to suggest the presence of an acute pulmonary embolus. Mediastinum/Nodes: Scattered small mediastinal lymph nodes again noted. No left hilar adenopathy. Stable soft tissue attenuation in the right hilar region. The esophagus has normal imaging features. There is no axillary lymphadenopathy. Lungs/Pleura: Centrilobular and paraseptal emphysema evident. The right upper lobe collapse/consolidative opacity is stable with cavitary component visible on 28/7. Similar architectural distortion, scarring, and airspace disease in the right lower lobe, lingula, and left lower lobe. Small bilateral pleural effusions again noted. Upper Abdomen: Mild fullness noted left  upper pole renal collecting system although renal pelvis and entire left kidney is not been visualized. Musculoskeletal: No worrisome lytic or sclerotic osseous abnormality. Mild inferior endplate compression deformity of a midthoracic vertebral body is stable. Review of the MIP images confirms the above findings. IMPRESSION: 1. No CT evidence for acute pulmonary embolus. 2. Similar appearance of the bilateral airspace disease with confluent consolidative change in the right lung apex demonstrating small cavitary component. Imaging features remain suspicious for multifocal pneumonia although neoplasm cannot be excluded. 3. Persistent small bilateral pleural effusions. 4. Ascending thoracic aorta measures 4.3 cm diameter. Recommend annual imaging followup by CTA or MRA. This recommendation follows 2010 ACCF/AHA/AATS/ACR/ASA/SCA/SCAI/SIR/STS/SVM Guidelines for the Diagnosis and Management of Patients with Thoracic Aortic Disease. Circulation. 2010; 121:: G626-R485 Aortic aneurysm NOS (ICD10-I71.9) 5.  Emphysema (ICD10-J43.9) and Aortic Atherosclerosis (ICD10-170.0) Electronically Signed   By: EMisty StanleyM.D.   On: 05/11/2019 16:31        Scheduled Meds: . aspirin EC  81 mg Oral Daily  . dextromethorphan-guaiFENesin  1 tablet Oral BID  . ethambutol  800 mg Oral Daily  . feeding supplement (ENSURE ENLIVE)  237 mL Oral TID BM  . finasteride  5 mg Oral Daily  . FLUoxetine  20 mg Oral Daily  . fluticasone furoate-vilanterol  1 puff Inhalation Daily  . folic acid  1 mg Oral Daily  . gabapentin  900 mg Oral QHS  . heparin  5,000 Units Subcutaneous Q8H  . ipratropium-albuterol  3 mL Nebulization Q6H  . mouth rinse  15 mL Mouth Rinse BID  . methylPREDNISolone (SOLU-MEDROL) injection  40 mg Intravenous Q12H  . metoprolol succinate  12.5 mg Oral Daily  . mirtazapine  7.5 mg Oral QHS  . montelukast  10 mg Oral QHS  . multivitamin with minerals  1 tablet Oral Daily  . nicotine  21 mg Transdermal Q24H    . pantoprazole  40 mg Oral Daily  . rifampin  600 mg Oral Daily  . roflumilast  250 mcg Oral QHS  . rosuvastatin  5 mg Oral QHS  . tamsulosin  0.4 mg Oral QPC supper  . thiamine  100 mg Oral Daily  . umeclidinium bromide  1 puff Inhalation Daily   Continuous Infusions: . ceFEPime (MAXIPIME) IV 2 g (05/13/19 0936)  LOS: 2 days    Time spent: 33 mins   Val Riles, MD [facility] Pager [phone]  If 7PM-7AM, please contact night-coverage [email or web address] 05/13/2019, 1:59 PM

## 2019-05-13 NOTE — Progress Notes (Signed)
ID brief note.  No fevers, sputum cx sent  64 y.o. 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 Has frequent readmissions for sob, and cough. He also has CHF with EF 35-40 %. BNP this admit was > 3500.  I suspect he does not have an acute pneumoia currently but rather combo of his CHF, COPD and destroyed lungs.  His symptoms and recurrent admissions are more likely from his sever COPD and CHF than from progressive infection  Rec Cont management of copd and chf. Inpatient diuresis would likely improve some of his resp dxs. Cont cefepime pending sputum cxs. Cont treatment for his MAI - should fu otpt with Dr Delaine Lame for this.

## 2019-05-14 LAB — CBC
HCT: 35.3 % — ABNORMAL LOW (ref 39.0–52.0)
Hemoglobin: 11.1 g/dL — ABNORMAL LOW (ref 13.0–17.0)
MCH: 28.5 pg (ref 26.0–34.0)
MCHC: 31.4 g/dL (ref 30.0–36.0)
MCV: 90.7 fL (ref 80.0–100.0)
Platelets: 222 10*3/uL (ref 150–400)
RBC: 3.89 MIL/uL — ABNORMAL LOW (ref 4.22–5.81)
RDW: 14.6 % (ref 11.5–15.5)
WBC: 9 10*3/uL (ref 4.0–10.5)
nRBC: 0 % (ref 0.0–0.2)

## 2019-05-14 LAB — BASIC METABOLIC PANEL
Anion gap: 9 (ref 5–15)
BUN: 16 mg/dL (ref 8–23)
CO2: 27 mmol/L (ref 22–32)
Calcium: 8.8 mg/dL — ABNORMAL LOW (ref 8.9–10.3)
Chloride: 103 mmol/L (ref 98–111)
Creatinine, Ser: 1.06 mg/dL (ref 0.61–1.24)
GFR calc Af Amer: >60 mL/min (ref >60)
GFR calc non Af Amer: >60 mL/min (ref >60)
Glucose, Bld: 119 mg/dL — ABNORMAL HIGH (ref 70–99)
Potassium: 4.8 mmol/L (ref 3.5–5.1)
Sodium: 139 mmol/L (ref 135–145)

## 2019-05-14 LAB — MAGNESIUM: Magnesium: 2.2 mg/dL (ref 1.7–2.4)

## 2019-05-14 LAB — PHOSPHORUS: Phosphorus: 4.1 mg/dL (ref 2.5–4.6)

## 2019-05-14 MED ORDER — GUAIFENESIN-DM 100-10 MG/5ML PO SYRP: 10.0000 mL | ORAL_SOLUTION | Freq: Four times a day (QID) | ORAL | 0 refills | Status: AC | PRN

## 2019-05-14 MED ORDER — LEVOFLOXACIN 750 MG PO TABS: 750.0000 mg | ORAL_TABLET | Freq: Every day | ORAL | 0 refills | Status: AC

## 2019-05-14 MED ORDER — RIFAMPIN 300 MG PO CAPS: 600.0000 mg | ORAL_CAPSULE | Freq: Every day | ORAL | 0 refills | Status: AC

## 2019-05-14 NOTE — Discharge Summary (Signed)
[facility] Discharge Summary   Patient: Joseph Peters JFH:[id number]  PCP: Barbaraann Boys, MD  Date of admission: 05/11/2019   Date of discharge:  05/14/2019     Discharge Diagnoses:   Principal Problem:   Acute on chronic respiratory failure with hypoxia ([place]) Active Problems:   Benign hypertension   COPD exacerbation ([place])   Essential hypertension   Protein-calorie malnutrition, severe   Gastroesophageal reflux disease without esophagitis   History of MAC infection   Acute on chronic systolic CHF (congestive heart failure) (HCC)   Elevated troponin   HCAP (healthcare-associated pneumonia)   Sepsis ([place])   CAD (coronary artery disease)   BPH (benign prostatic hyperplasia)   Depression   Stroke [facility])   Admitted From: Home Disposition:  Home with home health and visiting nurse  Recommendations for Outpatient Follow-up:  1. PCP: PCP and ID 2. Follow up LABS/TEST: Repeat CT scan after 4 weeks  Follow-up Information    [facility] Follow up on 05/22/2019.   Specialty: Cardiology Why: at 2:00pm. Enter through the [facility] entrance Contact information: [address] [place] [phone]         Diet recommendation: Cardiac diet  Activity: The patient is advised to gradually reintroduce usual activities, as tolerated  Discharge Condition: stable  Code Status: Full code   History of present illness: As per the H and P dictated on admission Joseph Peters is a 64 y.o. male with medical history significant of hypertension, hyperlipidemia, COPD on 3 L nasal cannula oxygen at home, asthma, stroke, GERD, depression, anxiety, RLS, CAD, sCHF with EF 35-40%, IBS, alcohol abuse, tobacco abuse, AAA, BPH, MAC infection, who presents with shortness of breath.  Patient was recently hospitalized from 3/2-3/7 due to CHF exacerbation, COPD exacerbation and HCAP.  Patient states that  after he went home, he continues to have shortness of breath.  Continues to have cough, but no fever or chills.  He coughs up streaks of blood occasionally.  He also reports chest pain, which is pressure-like, intermittent, nonradiating, moderate.  He also reports nausea, vomited once in the early morning, currently no nausea or vomiting.  He also has intermittent mild loose stool bowel movement.  No abdominal pain.  No symptoms of UTI.  Patient states that he stopped drinking alcohol 4 months ago.  He also stopped smoking.  ED Course: pt was found to have WBC 12.1, BNP> 4500, troponin 44, lactic acid 2.5, electrolytes renal function okay, temperature normal, blood pressure 163/110, tachycardia, tachypnea, oxygen sat 93% on 4 L nasal cannula oxygen, chest x-ray showed fibrotic change and patchy infiltration right upper lobe.  Patient is admitted to [facility] bed as inpatient.  Hospital Course:  Summary of his active problems in the hospital is as following.  # Acute on chronic hypoxic respiratory failure: Likely due to multifactorial etiology, including COPD exacerbation, possible HCAP, CHF exacerbation and history of MAC infection & w/ underlying fibrosis. S/p IV cefepime, rifampin, ethambutol & vano as per ID.  Patient's condition improved, patient is back to his baseline oxygen and feeling close to his baseline.  ID recommended to start Levaquin 750 mg p.o. daily for 5 days and then resume azithromycin which patient has at home.  Patient was advised to continue MAI treatment as well and follow with ID as an outpatient # HCAP: s/p IV vancomycin and cefepime as per ID. Continue on bronchodilators and incentive spirometry. Legionella, strep were ordered  but never sent by RN.  Blood cxs NGTD.  ID recommended Levaquin 700 mg p.o. daily for 5 days.  # Lactic acidosis: repeat lactic acid ordered.  Trending down 5.3>2.0 # Leukocytosis: likely secondary to infection.  Trended down to normal at 9.0 today # COPD  exacerbation: w/ underlying fibrosis as per CXR. Continue on singulair, steroids. Continue w/ bronchodilators ,Patient uses oxygen 3 L at home which is baseline # History of MAC infection: continue ethambutol and rifampin as per ID # Sepsis: secondary to above.  Resolved with antibiotics, vital signs remained stable. # Acute on chronic systolic CHF: echo on 03/03/1094 showed EF 35-40%. S/p lasix x1 in ER. 05/13/2019 s/p Lasix 40 mg IV daily, resumed oral Lasix PTA dose on discharge # HTN: continue on metoprolol # Hyperlipidemia: continue on statin # GERD: continue on PPI # Elevated troponin & hx of CAD: continue on statin, metoprolol, aspirin # Chest pain: likely secondary to pneumonia/MAC. CTA chest was neg for PE # BPH: continue flomax and proscar # Depression:severity unknown. Continue on mirtazapine, fluoxetine  # Protein-calorie malnutrition, severe: BMI 16.81. Continue w/ nutritional supplements  # Hx of stroke: continue on aspirin, crestor    Body mass index is 16.81 kg/m.  Nutrition Problem: Increased nutrient needs Etiology: catabolic illness(COPD, CHF, MAC infection) Nutrition Interventions: Interventions: Ensure Enlive (each supplement provides 350kcal and 20 grams of protein), Magic cup, MVI   No opiates for pain control prescribed - [facility] Controlled Substance Reporting System database was not reviewed. - Patient was instructed, not to drive, operate heavy machinery, perform activities at heights, swimming or participation in water activities or provide baby sitting services while on Pain, Sleep and Anxiety Medications; until his outpatient Physician has advised to do so again.  - Also recommended to not to take more than prescribed Pain, Sleep and Anxiety Medications.  Patient was ambulatory without any assistance. Patient was seen by physical therapy, who recommended Home health and PT, which was arranged. On the day of the discharge the patient's vitals were  stable, and no other acute medical condition were reported by patient. the patient was felt safe to be discharge at Home with Home health and PT  Consultants: ID  Procedures: None  Discharge Exam: General: Appear in mild distress, no Rash; Oral Mucosa Clear, moist. Cardiovascular: S1 and S2 Present, no Murmur, Respiratory: normal respiratory effort, Bilateral Air entry present and mild b/l Crackles, mild wheezes Abdomen: Bowel Sound present, Soft and no tenderness, no hernia Extremities: no Pedal edema, no calf tenderness Neurology: alert and oriented to time, place, and person affect appropriate.  Filed Weights   05/11/19 1121  Weight: 45.8 kg   Vitals:   05/14/19 0815 05/14/19 0900  BP: (!) 154/102 (!) 154/101  Pulse: (!) 107 (!) 109  Resp: (!) 24 19  Temp: (!) 96.8 F (36 C) 97.9 F (36.6 C)  SpO2: 96% 94%    DISCHARGE MEDICATION: Allergies as of 05/14/2019      Reactions   Penicillins Swelling, Rash, Other (See Comments)   Swelling in throat Did it involve swelling of the face/tongue/throat, SOB, or low BP? yes Did it involve sudden or severe rash/hives, skin peeling, or any reaction on the inside of your mouth or nose? no Did you need to seek medical attention at a hospital or doctor's office? Yes When did it last happen?years  If all above answers are "NO", may proceed with cephalosporin use.   Lidocaine Other (See Comments)   Unsure  At  dentist's office      Medication List    TAKE these medications   albuterol 108 (90 Base) MCG/ACT inhaler Commonly known as: VENTOLIN HFA Inhale 2 puffs into the lungs every 4 (four) hours as needed for wheezing or shortness of breath.   aspirin 81 MG EC tablet Take 1 tablet (81 mg total) by mouth daily.   Daliresp 250 MCG Tabs Generic drug: Roflumilast Take 250 mcg by mouth at bedtime.   ethambutol 400 MG tablet Commonly known as: MYAMBUTOL Take 2 tablets (800 mg total) by mouth daily.   finasteride 5 MG  tablet Commonly known as: PROSCAR Take 1 tablet (5 mg total) by mouth daily.   FLUoxetine 20 MG capsule Commonly known as: PROZAC Take 20 mg by mouth daily.   folic acid 1 MG tablet Commonly known as: FOLVITE Take 1 tablet (1 mg total) by mouth daily.   furosemide 40 MG tablet Commonly known as: LASIX Take 1 tablet (40 mg total) by mouth daily.   gabapentin 300 MG capsule Commonly known as: NEURONTIN Take 900 mg by mouth at bedtime.   guaiFENesin-dextromethorphan 100-10 MG/5ML syrup Commonly known as: ROBITUSSIN DM Take 10 mLs by mouth every 6 (six) hours as needed for up to 10 days for cough.   levofloxacin 750 MG tablet Commonly known as: Levaquin Take 1 tablet (750 mg total) by mouth daily for 5 days.   metoprolol succinate 25 MG 24 hr tablet Commonly known as: TOPROL-XL Take 0.5 tablets (12.5 mg total) by mouth daily.   mirtazapine 7.5 MG tablet Commonly known as: REMERON Take 1 tablet (7.5 mg total) by mouth at bedtime.   montelukast 10 MG tablet Commonly known as: SINGULAIR Take 10 mg by mouth at bedtime.   multivitamin with minerals Tabs tablet Take 1 tablet by mouth daily.   nicotine 21 mg/24hr patch Commonly known as: NICODERM CQ - dosed in mg/24 hours Place 1 patch (21 mg total) onto the skin daily.   omeprazole 20 MG capsule Commonly known as: PRILOSEC Take 20 mg by mouth daily.   Oxycodone HCl 10 MG Tabs Take 10 mg by mouth 4 (four) times daily as needed for pain.   rifampin 300 MG capsule Commonly known as: RIFADIN Take 2 capsules (600 mg total) by mouth daily.   rosuvastatin 5 MG tablet Commonly known as: CRESTOR Take 5 mg by mouth at bedtime.   tamsulosin 0.4 MG Caps capsule Commonly known as: FLOMAX Take 1 capsule (0.4 mg total) by mouth daily after supper.   thiamine 100 MG tablet Take 1 tablet (100 mg total) by mouth daily.   Trelegy Ellipta 100-62.5-25 MCG/INH Aepb Generic drug: Fluticasone-Umeclidin-Vilant Inhale 2 puffs into  the lungs at bedtime.      Allergies  Allergen Reactions  . Penicillins Swelling, Rash and Other (See Comments)    Swelling in throat  Did it involve swelling of the face/tongue/throat, SOB, or low BP? yes Did it involve sudden or severe rash/hives, skin peeling, or any reaction on the inside of your mouth or nose? no Did you need to seek medical attention at a hospital or doctor's office? Yes When did it last happen?years  If all above answers are "NO", may proceed with cephalosporin use.    . Lidocaine Other (See Comments)    Unsure  At dentist's office   Discharge Instructions    Call MD for:  difficulty breathing, headache or visual disturbances   Complete by: As directed    Call MD for:  severe uncontrolled pain   Complete by: As directed    Call MD for:  temperature >100.4   Complete by: As directed    Diet - low sodium heart healthy   Complete by: As directed    Discharge instructions   Complete by: As directed    Follow-up with PCP in 1 week, started Levaquin for 5 days and then patient needs to resume azithromycin which he has at home as per ID and continue rifampin and ethambutol. Continue incentive spirometer and flutter valve at home Follow with ID in 2 to 3 weeks as an outpatient.   Increase activity slowly   Complete by: As directed       The results of significant diagnostics from this hospitalization (including imaging, microbiology, ancillary and laboratory) are listed below for reference.    Significant Diagnostic Studies: DG Chest 1 View  Result Date: 05/02/2019 CLINICAL DATA:  Pt states SOB, chest pain, cough for 3 months. Pt states diagnosed with double pneumonia last month. Hx of asthma, hypertension, copd, myocardial infarction, pneumonia. Current smoker EXAM: CHEST  1 VIEW COMPARISON:  Chest radiograph 04/28/2019 FINDINGS: Stable cardiomediastinal contours. Lungs are hyperinflated. Emphysema. Persistent consolidation throughout the right upper  lobe. Interval mild decrease in bilateral diffuse opacities in the right lower lung and left mid to lower lung. No new focal opacity. No pneumothorax or large pleural effusion. No acute finding in the visualized skeleton. IMPRESSION: Persistent consolidation in the right upper lobe. Interval improvement in diffuse patchy opacities seen in the right lower and left mid to lower lungs. Electronically Signed   By: Audie Pinto M.D.   On: 05/02/2019 14:02   CT ANGIO CHEST PE W OR WO CONTRAST  Result Date: 05/11/2019 CLINICAL DATA:  Shortness of breath and cough.  Worsening dyspnea. EXAM: CT ANGIOGRAPHY CHEST WITH CONTRAST TECHNIQUE: Multidetector CT imaging of the chest was performed using the standard protocol during bolus administration of intravenous contrast. Multiplanar CT image reconstructions and MIPs were obtained to evaluate the vascular anatomy. CONTRAST:  46m OMNIPAQUE IOHEXOL 350 MG/ML SOLN COMPARISON:  04/28/2019 FINDINGS: Cardiovascular: The heart size is normal. No substantial pericardial effusion. Coronary artery calcification is evident. Ascending thoracic aorta measures 4.3 cm diameter. Atherosclerotic calcification is noted in the wall of the thoracic aorta. No filling defect within the opacified pulmonary arteries to suggest the presence of an acute pulmonary embolus. Mediastinum/Nodes: Scattered small mediastinal lymph nodes again noted. No left hilar adenopathy. Stable soft tissue attenuation in the right hilar region. The esophagus has normal imaging features. There is no axillary lymphadenopathy. Lungs/Pleura: Centrilobular and paraseptal emphysema evident. The right upper lobe collapse/consolidative opacity is stable with cavitary component visible on 28/7. Similar architectural distortion, scarring, and airspace disease in the right lower lobe, lingula, and left lower lobe. Small bilateral pleural effusions again noted. Upper Abdomen: Mild fullness noted left upper pole renal  collecting system although renal pelvis and entire left kidney is not been visualized. Musculoskeletal: No worrisome lytic or sclerotic osseous abnormality. Mild inferior endplate compression deformity of a midthoracic vertebral body is stable. Review of the MIP images confirms the above findings. IMPRESSION: 1. No CT evidence for acute pulmonary embolus. 2. Similar appearance of the bilateral airspace disease with confluent consolidative change in the right lung apex demonstrating small cavitary component. Imaging features remain suspicious for multifocal pneumonia although neoplasm cannot be excluded. 3. Persistent small bilateral pleural effusions. 4. Ascending thoracic aorta measures 4.3 cm diameter. Recommend annual imaging followup by CTA or MRA. This  recommendation follows 2010 ACCF/AHA/AATS/ACR/ASA/SCA/SCAI/SIR/STS/SVM Guidelines for the Diagnosis and Management of Patients with Thoracic Aortic Disease. Circulation. 2010; 121: Z563-O756. Aortic aneurysm NOS (ICD10-I71.9) 5.  Emphysema (ICD10-J43.9) and Aortic Atherosclerosis (ICD10-170.0) Electronically Signed   By: Misty Stanley M.D.   On: 05/11/2019 16:31   CT Angio Chest PE W/Cm &/Or Wo Cm  Result Date: 04/28/2019 CLINICAL DATA:  Short of breath.  Tachycardia. EXAM: CT ANGIOGRAPHY CHEST WITH CONTRAST TECHNIQUE: Multidetector CT imaging of the chest was performed using the standard protocol during bolus administration of intravenous contrast. Multiplanar CT image reconstructions and MIPs were obtained to evaluate the vascular anatomy. CONTRAST:  75 mL Omnipaque COMPARISON:  Chest CT 04/16/2019 FINDINGS: Cardiovascular: No filling defects within the pulmonary arteries to suggest acute pulmonary embolism. Ascending aorta is aneurysmal at 42 mm (image 30/7). Aneurysm is fusiform. No axillary supraclavicular adenopathy. No mediastinal hilar adenopathy. No pericardial effusion. Mediastinum/Nodes: No axillary supraclavicular adenopathy. No mediastinal  adenopathy. Lungs/Pleura: Severe centrilobular emphysema. Superimposed consolidation in the RIGHT upper lobe not changed from prior. Small central cavitation is also similar (image 26/6). Pleural fluid at the RIGHT lung base and airspace disease in the RIGHT lower lobe is also similar. Within the LEFT lung, moderate pleural effusion. New airspace disease in the lower lobe and lingula. These findings in LEFT lung are worsened from comparison exam. Upper Abdomen: Limited view of the liver, kidneys, pancreas are unremarkable. Normal adrenal glands. Musculoskeletal: No aggressive osseous lesion. Review of the MIP images confirms the above findings. IMPRESSION: 1. No evidence acute pulmonary embolism. 2. Worsening airspace disease in the lingula and LEFT lower lobe suggesting pulmonary edema versus pneumonia. 3. Stable dense consolidation in the RIGHT upper lobe and diffuse airspace disease in the RIGHT lower lobe suggesting multifocal pneumonia. 4. Bilateral pleural effusions unchanged. 5. Ascending thoracic aortic aneurysm. Recommend annual imaging followup by CTA or MRA. This recommendation follows 2010 ACCF/AHA/AATS/ACR/ASA/SCA/SCAI/SIR/STS/SVM Guidelines for the Diagnosis and Management of Patients with Thoracic Aortic Disease. Circulation. 2010; 121: E332-R518. Aortic aneurysm NOS (ICD10-I71.9) Electronically Signed   By: Suzy Bouchard M.D.   On: 04/28/2019 12:47   CT Angio Chest PE W and/or Wo Contrast  Result Date: 04/16/2019 CLINICAL DATA:  Shortness of breath and hypoxia EXAM: CT ANGIOGRAPHY CHEST WITH CONTRAST TECHNIQUE: Multidetector CT imaging of the chest was performed using the standard protocol during bolus administration of intravenous contrast. Multiplanar CT image reconstructions and MIPs were obtained to evaluate the vascular anatomy. CONTRAST:  53m OMNIPAQUE IOHEXOL 350 MG/ML SOLN COMPARISON:  04/07/2019. FINDINGS: Cardiovascular: Atheromatous calcifications, including the coronary arteries  and aorta. Normally opacified pulmonary arteries with no pulmonary arterial filling defects seen. The ascending thoracic aorta measures 4.5 cm in maximum diameter, previously 4.6 cm. Aneurysmal dilatation of the descending thoracic aorta with a maximum diameter of 3.5 cm, previously 3.8 cm. Aneurysmal dilatation of the infrarenal abdominal aorta with a maximum diameter of 4.0 cm on the included portions. Mediastinum/Nodes: Mildly enlarged right hilar lymph nodes with little change. The largest measures 1.1 cm in short axis diameter on image number 55 series 4. Thyroid gland, trachea, and esophagus demonstrate no significant findings. Lungs/Pleura: Stable marked bilateral centrilobular bullous changes. Previously demonstrated right upper lobe consolidation with air bronchograms and focal distal bronchial dilatation without significant change. Small amount of consolidation in the posterior left lower lobe with little change. Moderate-sized right pleural effusion and small to moderate-sized left pleural effusion, both increased. Upper Abdomen: Dilated in for abdominal aorta as described above, not included in its entirety. Musculoskeletal: Stable  20% inferior endplate compression deformity of a midthoracic vertebral body. Review of the MIP images confirms the above findings. IMPRESSION: 1. No pulmonary emboli. 2. Stable right upper lobe consolidation with air bronchograms and focal distal bronchial dilatation. 3. Stable small amount of consolidation in the posterior left lower lobe. 4. Stable mild right hilar adenopathy, most likely reactive. 5. Stable marked changes of COPD. 6. Stable aneurysmal dilatation of the ascending thoracic aorta and descending thoracic aorta with interval included aneurysmal dilatation of the infrarenal abdominal aorta, not included in its entirety, measuring 4.0 cm. 7. Moderate-sized right pleural effusion and small to moderate-sized left pleural effusion, both increased. 8. Calcific  coronary artery and aortic atherosclerosis. Aortic Atherosclerosis (ICD10-I70.0) and Emphysema (ICD10-J43.9). Electronically Signed   By: Claudie Revering M.D.   On: 04/16/2019 16:28   DG Chest Right Decubitus  Result Date: 04/21/2019 CLINICAL DATA:  Dyspnea, recent right-sided thoracentesis EXAM: CHEST - RIGHT DECUBITUS; CHEST - LEFT DECUBITUS COMPARISON:  CT 04/16/2019 FINDINGS: Bilateral decubitus views of the chest were obtained. Small to moderate right-sided pleural effusion with small loculated component at the right upper lobe. Trace layering left-sided pleural effusion without loculated component. There is no pneumothorax. Chronic consolidation in the right upper lobe. Fibrotic changes throughout both lungs. Stable heart size. IMPRESSION: 1. Negative for pneumothorax. 2. Small to moderate right-sided pleural effusion with small loculated component at the right upper lobe. 3. Trace layering left-sided pleural effusion without loculated component. Electronically Signed   By: Davina Poke D.O.   On: 04/21/2019 11:17   DG Chest Left Decubitus  Result Date: 04/21/2019 CLINICAL DATA:  Dyspnea, recent right-sided thoracentesis EXAM: CHEST - RIGHT DECUBITUS; CHEST - LEFT DECUBITUS COMPARISON:  CT 04/16/2019 FINDINGS: Bilateral decubitus views of the chest were obtained. Small to moderate right-sided pleural effusion with small loculated component at the right upper lobe. Trace layering left-sided pleural effusion without loculated component. There is no pneumothorax. Chronic consolidation in the right upper lobe. Fibrotic changes throughout both lungs. Stable heart size. IMPRESSION: 1. Negative for pneumothorax. 2. Small to moderate right-sided pleural effusion with small loculated component at the right upper lobe. 3. Trace layering left-sided pleural effusion without loculated component. Electronically Signed   By: Davina Poke D.O.   On: 04/21/2019 11:17   DG Chest Port 1 View  Result Date:  05/11/2019 CLINICAL DATA:  Shortness of breath EXAM: PORTABLE CHEST 1 VIEW COMPARISON:  May 02, 2019 chest radiograph as well as chest radiograph and chest CT April 28, 2019 FINDINGS: There remains underlying fibrosis bilaterally. There is consolidation with volume loss in the right upper lobe, similar to recent studies. There is patchy ill-defined opacity throughout the left mid and lower lung zones and right base, likely foci of multifocal pneumonia superimposed on fibrosis. There is a fairly small pleural effusion on each side. No new opacity evident compared to recent studies. Heart size is within normal limits. There is distortion of pulmonary vascularity on the right. The pulmonary vascularity on the left is grossly normal. No adenopathy is appreciable by radiography. No focal bone lesions. IMPRESSION: Underlying fibrosis. Consolidation right upper lobe with volume loss, stable. Suspect patchy pneumonia elsewhere, stable from recent studies. Small pleural effusion bilaterally. Stable cardiac silhouette. Electronically Signed   By: Lowella Grip III M.D.   On: 05/11/2019 11:40   DG Chest Portable 1 View  Result Date: 04/28/2019 CLINICAL DATA:  Shortness of breath. EXAM: PORTABLE CHEST 1 VIEW COMPARISON:  04/27/2019 FINDINGS: The cardiac silhouette, mediastinal and hilar contours  are within normal limits and stable. Stable tortuosity of the thoracic aorta. Stable underlying chronic lung disease with emphysema and pulmonary scarring. Persistent superimposed bilateral infiltrates with persistent right upper lobe airspace consolidation and air bronchograms. Small right pleural effusion. No pneumothorax. IMPRESSION: Stable appearing underlying chronic lung disease/emphysema and pulmonary scarring with superimposed infiltrates and small right effusion. Electronically Signed   By: Marijo Sanes M.D.   On: 04/28/2019 10:56   DG Chest Portable 1 View  Result Date: 04/27/2019 CLINICAL DATA:  Shortness of breath  EXAM: PORTABLE CHEST 1 VIEW COMPARISON:  04/22/2019 FINDINGS: Increased density at the right lung base. Stable right pleural effusion. Stable fibrotic changes. Cardiomediastinal contours and volume loss in the right chest are unchanged. IMPRESSION: Increased patchy atelectasis/consolidation at the right lung base. Otherwise stable appearance. Electronically Signed   By: Macy Mis M.D.   On: 04/27/2019 14:40   DG Chest Port 1 View  Result Date: 04/22/2019 CLINICAL DATA:  Right-sided pleural effusion EXAM: PORTABLE CHEST 1 VIEW COMPARISON:  04/17/2019 FINDINGS: Stable cardiomediastinal contours. Chronic consolidation within the right upper lobe. Fibrotic changes throughout both lungs. No significant pleural fluid collection is seen on frontal view. No pneumothorax IMPRESSION: No pneumothorax status post thoracentesis. Electronically Signed   By: Davina Poke D.O.   On: 04/22/2019 13:06   DG Chest Port 1 View  Result Date: 04/17/2019 CLINICAL DATA:  Right-sided thoracentesis EXAM: PORTABLE CHEST 1 VIEW COMPARISON:  Yesterday FINDINGS: Interstitial and airspace opacity in the right upper lobe with volume loss. Normal heart size and stable mediastinal contours. No visible pneumothorax. Trace left pleural effusion. IMPRESSION: 1. No complicating features after thoracentesis. 2. Emphysema and stable right upper lobe opacity with volume loss. Electronically Signed   By: Monte Fantasia M.D.   On: 04/17/2019 11:17   DG Chest Portable 1 View  Result Date: 04/16/2019 CLINICAL DATA:  Shortness of breath EXAM: PORTABLE CHEST 1 VIEW COMPARISON:  April 08, 2019 chest radiograph and chest CT Jul 05 2019 FINDINGS: Extensive fibrosis is noted throughout the lungs bilaterally. There may be a degree of superimposed interstitial edema given increase in interstitial thickening compared to recent CT examination. There is equivocal increase in interstitial thickening bilaterally compared to most recent chest  radiograph. There may be patchy airspace opacity intermingled with interstitial prominence. There remains extensive consolidation in the right upper lobe. Heart size and pulmonary vascularity are stable and within normal limits given the underlying fibrosis. No adenopathy is appreciable by radiography. No bone lesions. IMPRESSION: Persistent extensive fibrosis with suspected superimposed interstitial edema and possible interspersed airspace opacity. The degree of ill-defined opacity, primarily in the left lower lobe is modestly increased compared to most recent chest radiograph and is definitely increased compared to most recent CT. Consolidation remains in the right upper lobe. Stable cardiac silhouette. Electronically Signed   By: Lowella Grip III M.D.   On: 04/16/2019 14:43   Pulmonary Function Test [facility] Only  Result Date: 04/23/2019 I DO NOT SEE PFT's for this encounter  US THORACENTESIS ASP PLEURAL SPACE W/IMG GUIDE  Result Date: 04/22/2019 INDICATION: 64 year old male with recurrent right-sided pleural effusion. EXAM: ULTRASOUND GUIDED RIGHT THORACENTESIS MEDICATIONS: None. COMPLICATIONS: None immediate. PROCEDURE: An ultrasound guided thoracentesis was thoroughly discussed with the patient and questions answered. The benefits, risks, alternatives and complications were also discussed. The patient understands and wishes to proceed with the procedure. Written consent was obtained. Ultrasound was performed to localize and mark an adequate pocket of fluid in the right chest. The area  was then prepped and draped in the normal sterile fashion. 1% Lidocaine was used for local anesthesia. Under ultrasound guidance a 6 Fr Safe-T-Centesis catheter was introduced. Thoracentesis was performed. The catheter was removed and a dressing applied. FINDINGS: A total of approximately 450 mL of yellow pleural fluid was removed. Samples were sent to the laboratory if requested by the clinical team. IMPRESSION:  Successful ultrasound guided right thoracentesis yielding 450 mL of pleural fluid. Electronically Signed   By: Jacqulynn Cadet M.D.   On: 04/22/2019 17:08   US THORACENTESIS ASP PLEURAL SPACE W/IMG GUIDE  Result Date: 04/17/2019 INDICATION: Patient with history of COPD, shortness of breath, right-sided pleural effusion. Request is made for diagnostic and therapeutic right thoracentesis. EXAM: ULTRASOUND GUIDED RIGHT THORACENTESIS MEDICATIONS: 10 mL 2% nesacaine Patient with documented allergy to lidocaine, recent tolerance of nesacaine injection 0/35/46 COMPLICATIONS: None immediate. PROCEDURE: An ultrasound guided thoracentesis was thoroughly discussed with the patient and questions answered. The benefits, risks, alternatives and complications were also discussed. The patient understands and wishes to proceed with the procedure. Written consent was obtained. Ultrasound was performed to localize and mark an adequate pocket of fluid in the right chest. The area was then prepped and draped in the normal sterile fashion. 1% Lidocaine was used for local anesthesia. Under ultrasound guidance a 6 Fr Safe-T-Centesis catheter was introduced. Thoracentesis was performed. The catheter was removed and a dressing applied. FINDINGS: A total of approximately 700 mL of yellow, clear fluid was removed. Samples were sent to the laboratory as requested by the clinical team. IMPRESSION: Successful ultrasound guided right thoracentesis yielding 700 mL of pleural fluid. Read by: Brynda Greathouse PA-C Electronically Signed   By: Aletta Edouard M.D.   On: 04/17/2019 11:26    Microbiology: Recent Results (from the past 240 hour(s))  Respiratory Panel by RT PCR (Flu A&B, Covid) - Nasopharyngeal Swab     Status: None   Collection Time: 05/11/19 11:24 AM   Specimen: Nasopharyngeal Swab  Result Value Ref Range Status   SARS Coronavirus 2 by RT PCR NEGATIVE NEGATIVE Final    Comment: (NOTE) SARS-CoV-2 target nucleic acids are  NOT DETECTED. The SARS-CoV-2 RNA is generally detectable in upper respiratoy specimens during the acute phase of infection. The lowest concentration of SARS-CoV-2 viral copies this assay can detect is 131 copies/mL. A negative result does not preclude SARS-Cov-2 infection and should not be used as the sole basis for treatment or other patient management decisions. A negative result may occur with  improper specimen collection/handling, submission of specimen other than nasopharyngeal swab, presence of viral mutation(s) within the areas targeted by this assay, and inadequate number of viral copies (<131 copies/mL). A negative result must be combined with clinical observations, patient history, and epidemiological information. The expected result is Negative. Fact Sheet for Patients:  PinkCheek.be Fact Sheet for Healthcare Providers:  GravelBags.it This test is not yet ap proved or cleared by the [place] FDA and  has been authorized for detection and/or diagnosis of SARS-CoV-2 by FDA under an Emergency Use Authorization (EUA). This EUA will remain  in effect (meaning this test can be used) for the duration of the COVID-19 declaration under Section 564(b)(1) of the Act, 21 U.S.C. section 360bbb-3(b)(1), unless the authorization is terminated or revoked sooner.    Influenza A by PCR NEGATIVE NEGATIVE Final   Influenza B by PCR NEGATIVE NEGATIVE Final    Comment: (NOTE) The Xpert Xpress SARS-CoV-2/FLU/RSV assay is intended as an aid in  the diagnosis  of influenza from Nasopharyngeal swab specimens and  should not be used as a sole basis for treatment. Nasal washings and  aspirates are unacceptable for Xpert Xpress SARS-CoV-2/FLU/RSV  testing. Fact Sheet for Patients: PinkCheek.be Fact Sheet for Healthcare Providers: GravelBags.it This test is not yet approved or  cleared by the [place] FDA and  has been authorized for detection and/or diagnosis of SARS-CoV-2 by  FDA under an Emergency Use Authorization (EUA). This EUA will remain  in effect (meaning this test can be used) for the duration of the  Covid-19 declaration under Section 564(b)(1) of the Act, 21  U.S.C. section 360bbb-3(b)(1), unless the authorization is  terminated or revoked. Performed at [facility], [address] [place], [place] [address]   Blood culture (routine x 2)     Status: None (Preliminary result)   Collection Time: 05/11/19 11:34 AM   Specimen: BLOOD  Result Value Ref Range Status   Specimen Description BLOOD RIGHT [facility]  Final   Special Requests   Final    BOTTLES DRAWN AEROBIC AND ANAEROBIC Blood Culture results may not be optimal due to an inadequate volume of blood received in culture bottles   Culture   Final    NO GROWTH 3 DAYS Performed at [facility], [address], [place], [place] [address]    Report Status PENDING  Incomplete  Blood culture (routine x 2)     Status: None (Preliminary result)   Collection Time: 05/11/19 11:35 AM   Specimen: BLOOD  Result Value Ref Range Status   Specimen Description BLOOD RIGHT ARM  Final   Special Requests   Final    BOTTLES DRAWN AEROBIC AND ANAEROBIC Blood Culture results may not be optimal due to an inadequate volume of blood received in culture bottles   Culture   Final    NO GROWTH 3 DAYS Performed at [facility], [address], [place], [place] [address]    Report Status PENDING  Incomplete  MRSA PCR Screening     Status: None   Collection Time: 05/11/19  8:19 PM   Specimen: Nasopharyngeal  Result Value Ref Range Status   MRSA by PCR NEGATIVE NEGATIVE Final    Comment:        The GeneXpert MRSA Assay (FDA approved for NASAL specimens only), is one component of a comprehensive MRSA colonization surveillance program. It is not intended to diagnose MRSA infection nor to  guide or monitor treatment for MRSA infections. Performed at [facility], [address] [place], [place] [address]   Expectorated sputum assessment w rflx to resp cult     Status: None   Collection Time: 05/13/19  1:17 PM   Specimen: Expectorated Sputum  Result Value Ref Range Status   Specimen Description EXPECTORATED SPUTUM  Final   Special Requests NONE  Final   Sputum evaluation   Final    THIS SPECIMEN IS ACCEPTABLE FOR SPUTUM CULTURE Performed at [facility], [address], [place], [place] [address]    Report Status 05/13/2019 FINAL  Final  Culture, respiratory     Status: None (Preliminary result)   Collection Time: 05/13/19  1:17 PM  Result Value Ref Range Status   Specimen Description   Final    EXPECTORATED SPUTUM Performed at [facility], [address], [place], [place] [address]    Special Requests   Final    NONE Reflexed from [id number] Performed at [facility], [address] [place],  [place] [address]    Gram Stain   Final    ABUNDANT WBC PRESENT, PREDOMINANTLY PMN FEW YEAST RARE GRAM POSITIVE COCCI    Culture   Final    NO GROWTH < 12 HOURS Performed at [facility], [place] [address] [place], [place] [address]    Report Status PENDING  Incomplete     Labs: CBC: Recent Labs  Lab 05/11/19 1134 05/13/19 0414 05/14/19 0411  WBC 12.1* 8.2 9.0  NEUTROABS 9.4*  --   --   HGB 14.3 10.9* 11.1*  HCT 44.7 33.5* 35.3*  MCV 89.6 89.3 90.7  PLT 277 232 633   Basic Metabolic Panel: Recent Labs  Lab 05/11/19 1134 05/12/19 0241 05/13/19 0414 05/14/19 0411  NA 135  --  138 139  K 3.7  --  4.5 4.8  CL 97*  --  102 103  CO2 23  --  26 27  GLUCOSE 104*  --  121* 119*  BUN 9  --  15 16  CREATININE 1.09 1.08 0.90 1.06  CALCIUM 9.4  --  8.5* 8.8*  MG  --   --  2.1 2.2  PHOS  --   --  4.3 4.1   Liver Function Tests: Recent Labs  Lab 05/13/19 0414  AST 13*  ALT 10  ALKPHOS 73  BILITOT 0.8  PROT  6.6  ALBUMIN 2.8*   No results for input(s): LIPASE, AMYLASE in the last 168 hours. No results for input(s): AMMONIA in the last 168 hours. Cardiac Enzymes: No results for input(s): CKTOTAL, CKMB, CKMBINDEX, TROPONINI in the last 168 hours. BNP (last 3 results) Recent Labs    05/02/19 0853 05/03/19 0526 05/11/19 1134  BNP 1,039.0* 1,568.0* >4,500.0*   CBG: No results for input(s): GLUCAP in the last 168 hours.  Time spent: 35 minutes  Signed:  Val Riles  [facility]  05/14/2019 1:23 PM

## 2019-05-14 NOTE — Progress Notes (Signed)
Discharge education completed. IV removed. Waiting on transportation to take pt. Out to front. No additional needs noted.

## 2019-05-14 NOTE — Progress Notes (Signed)
Bedside shift report received from [place], [place]. Morning assessment completed. Pt. Sitting up in bed, talking calmly. Denies any current needs. Call light is within reach, will continue to monitor.

## 2019-05-14 NOTE — Progress Notes (Signed)
Morning medications administered per orders. Pt. Sitting up in bed, breakfast has been completed. Denies any pain or needs at this time. Will continue to monitor.

## 2019-05-14 NOTE — TOC Transition Note (Signed)
Transition of Care [facility]) - CM/SW Discharge Note   Patient Details  Name: Joseph Peters MRN: [id number] Date of Birth: 1955/11/13  [facility]) CM/SW Contact:  Shelbie Hutching, RN Phone Number: 05/14/2019, 2:30 PM   Clinical Narrative:    Patient medically cleared for discharge home.  Patient discharging with home health services through [facility].  Floydene Flock aware of discharge today.  Family is picking patient up.    Final next level of care: Home w Home Health Services Barriers to Discharge: No Barriers Identified, Barriers Resolved   Patient Goals and CMS Choice   CMS Medicare.gov Compare Post Acute Care list provided to:: Patient Choice offered to / list presented to : Patient  Discharge Placement                       Discharge Plan and Services   Discharge Planning Services: CM Consult Post Acute Care Choice: Resumption of Svcs/PTA Provider                    HH Arranged: PT, RN [facility] Agency: [place] ([place]) Date [place]: 05/14/19 Time [place]: [phone] Representative spoke with at [place]: [place] (SDOH) Interventions     Readmission Risk Interventions Readmission Risk Prevention Plan 05/12/2019 04/29/2019 04/21/2019  Transportation Screening Complete Complete Complete  PCP or Specialist Appt within 3-5 Days - - Complete  [place] or [place] - - Complete  Palliative Care Screening - - Complete  Medication Review ([place]) Referral to Pharmacy Complete Referral to Pharmacy  PCP or Specialist appointment within 3-5 days of discharge - Complete -  [place] or Home Care Consult Complete Complete -  SW Recovery Care/Counseling Consult Complete - -  Palliative Care Screening Complete - -  [place] Not Applicable - -  Some recent data might be hidden

## 2019-05-15 ENCOUNTER — Telehealth: Payer: Self-pay | Admitting: Family

## 2019-05-15 NOTE — Telephone Encounter (Signed)
LVM with patient to confirm his [facility] appointment as have scheduled after his recent hospital d/c on 3/18. Asked patient to give [place] a call back to confirm the appointment and to follow up with patient status since his discharge.   Alyse Low, [place]

## 2019-05-16 LAB — CULTURE, RESPIRATORY W GRAM STAIN

## 2019-05-16 LAB — CULTURE, BLOOD (ROUTINE X 2)
Culture: NO GROWTH
Culture: NO GROWTH

## 2019-05-21 LAB — FUNGUS CULTURE RESULT

## 2019-05-21 LAB — FUNGUS CULTURE WITH STAIN

## 2019-05-21 LAB — FUNGAL ORGANISM REFLEX

## 2019-05-21 NOTE — Progress Notes (Deleted)
   Patient ID: Joseph Peters, male    DOB: [birth year], 64 y.o.   MRN: [id number]  HPI  Joseph Peters is a 64 y/o male with a history of  Echo report from 04/07/19 reviewed and showed an EF of 35-40% along with mild Joseph and mild/moderate AS.   Admitted 05/11/19 due to acute hypoxic failure. ID consult obtained. Given IV antibiotics due to pneumonia. Initially given IV lasix and then transitioned to oral diuretics. Discharged after 3 days. Admitted 04/28/19 due to acute on chronic respiratory failure due to COPD exacerbation. Cardiology consult obtained. Chest CT was negative for PE. IV antibiotics given for pneumonia. Initially given IV lasix and then transitioned to oral diuretics. Discharged after 5 days. Was in the ED 04/27/19 due to COPD exacerbation where he was treated and released.   He presents today for his initial visit with a chief complaint of   Review of Systems    Physical Exam  Assessment & Plan:  1: Chronic heart failure with reduced ejection fraction- - NYHA class  - BNP 05/11/19 was >4500.00  2: HTN- - BP - saw PCP (Joseph Peters) 03/31/19 - BMP 05/14/19 reviewed and showed sodium 139, potassium 4.8, creatinine 1.06 and GFR >60  3: COPD-

## 2019-05-22 ENCOUNTER — Ambulatory Visit: Payer: Medicare Other | Admitting: Family

## 2019-05-22 ENCOUNTER — Other Ambulatory Visit: Payer: Self-pay

## 2019-05-22 ENCOUNTER — Emergency Department
Admission: EM | Admit: 2019-05-22 | Discharge: 2019-05-22 | Disposition: A | Discharge status: Home / Self Care | Payer: Medicare Other | Attending: Emergency Medicine | Admitting: Emergency Medicine

## 2019-05-22 ENCOUNTER — Encounter: Payer: Self-pay | Admitting: Emergency Medicine

## 2019-05-22 ENCOUNTER — Emergency Department: Payer: Medicare Other

## 2019-05-22 DIAGNOSIS — R0789 Other chest pain: Secondary | ICD-10-CM | POA: Diagnosis present

## 2019-05-22 DIAGNOSIS — Z79899 Other long term (current) drug therapy: Secondary | ICD-10-CM | POA: Diagnosis not present

## 2019-05-22 DIAGNOSIS — Z8701 Personal history of pneumonia (recurrent): Secondary | ICD-10-CM | POA: Diagnosis not present

## 2019-05-22 DIAGNOSIS — J9611 Chronic respiratory failure with hypoxia: Secondary | ICD-10-CM | POA: Diagnosis not present

## 2019-05-22 DIAGNOSIS — J449 Chronic obstructive pulmonary disease, unspecified: Secondary | ICD-10-CM | POA: Diagnosis not present

## 2019-05-22 DIAGNOSIS — I509 Heart failure, unspecified: Secondary | ICD-10-CM | POA: Insufficient documentation

## 2019-05-22 DIAGNOSIS — R071 Chest pain on breathing: Secondary | ICD-10-CM | POA: Insufficient documentation

## 2019-05-22 DIAGNOSIS — I2510 Atherosclerotic heart disease of native coronary artery without angina pectoris: Secondary | ICD-10-CM | POA: Insufficient documentation

## 2019-05-22 DIAGNOSIS — E876 Hypokalemia: Secondary | ICD-10-CM | POA: Diagnosis not present

## 2019-05-22 DIAGNOSIS — Z8619 Personal history of other infectious and parasitic diseases: Secondary | ICD-10-CM

## 2019-05-22 DIAGNOSIS — I110 Hypertensive heart disease with heart failure: Secondary | ICD-10-CM | POA: Insufficient documentation

## 2019-05-22 LAB — CBC WITH DIFFERENTIAL/PLATELET
Abs Immature Granulocytes: 0.02 10*3/uL (ref 0.00–0.07)
Basophils Absolute: 0 10*3/uL (ref 0.0–0.1)
Basophils Relative: 0 %
Eosinophils Absolute: 0.2 10*3/uL (ref 0.0–0.5)
Eosinophils Relative: 2 %
HCT: 34.2 % — ABNORMAL LOW (ref 39.0–52.0)
Hemoglobin: 11.5 g/dL — ABNORMAL LOW (ref 13.0–17.0)
Immature Granulocytes: 0 %
Lymphocytes Relative: 21 %
Lymphs Abs: 1.4 10*3/uL (ref 0.7–4.0)
MCH: 29.9 pg (ref 26.0–34.0)
MCHC: 33.6 g/dL (ref 30.0–36.0)
MCV: 88.8 fL (ref 80.0–100.0)
Monocytes Absolute: 0.9 10*3/uL (ref 0.1–1.0)
Monocytes Relative: 13 %
Neutro Abs: 4.2 10*3/uL (ref 1.7–7.7)
Neutrophils Relative %: 64 %
Platelets: 166 10*3/uL (ref 150–400)
RBC: 3.85 MIL/uL — ABNORMAL LOW (ref 4.22–5.81)
RDW: 15.5 % (ref 11.5–15.5)
WBC: 6.7 10*3/uL (ref 4.0–10.5)
nRBC: 0 % (ref 0.0–0.2)

## 2019-05-22 LAB — COMPREHENSIVE METABOLIC PANEL
ALT: 66 U/L — ABNORMAL HIGH (ref 0–44)
AST: 49 U/L — ABNORMAL HIGH (ref 15–41)
Albumin: 2.6 g/dL — ABNORMAL LOW (ref 3.5–5.0)
Alkaline Phosphatase: 92 U/L (ref 38–126)
Anion gap: 10 (ref 5–15)
BUN: 18 mg/dL (ref 8–23)
CO2: 27 mmol/L (ref 22–32)
Calcium: 7.9 mg/dL — ABNORMAL LOW (ref 8.9–10.3)
Chloride: 98 mmol/L (ref 98–111)
Creatinine, Ser: 1.12 mg/dL (ref 0.61–1.24)
GFR calc Af Amer: >60 mL/min (ref >60)
GFR calc non Af Amer: >60 mL/min (ref >60)
Glucose, Bld: 86 mg/dL (ref 70–99)
Potassium: 3.3 mmol/L — ABNORMAL LOW (ref 3.5–5.1)
Sodium: 135 mmol/L (ref 135–145)
Total Bilirubin: 0.6 mg/dL (ref 0.3–1.2)
Total Protein: 6.2 g/dL — ABNORMAL LOW (ref 6.5–8.1)

## 2019-05-22 LAB — TROPONIN I (HIGH SENSITIVITY)
Troponin I (High Sensitivity): 48 ng/L — ABNORMAL HIGH (ref <18)
Troponin I (High Sensitivity): 47 ng/L — ABNORMAL HIGH (ref <18)

## 2019-05-22 MED ORDER — ONDANSETRON HCL 4 MG/2ML IJ SOLN: 4.0000 mg | Freq: Once | INTRAMUSCULAR | Status: DC

## 2019-05-22 MED ORDER — IPRATROPIUM-ALBUTEROL 0.5-2.5 (3) MG/3ML IN SOLN
RESPIRATORY_TRACT | Status: AC
  Administered 2019-05-22: 14:00:00 3 mL
  Filled 2019-05-22: qty 3

## 2019-05-22 MED ORDER — ONDANSETRON HCL 4 MG/2ML IJ SOLN
INTRAMUSCULAR | Status: AC
  Administered 2019-05-22: 4 mg
  Filled 2019-05-22: qty 2

## 2019-05-22 MED ORDER — IPRATROPIUM-ALBUTEROL 0.5-2.5 (3) MG/3ML IN SOLN: 3.0000 mL | Freq: Once | RESPIRATORY_TRACT | Status: DC

## 2019-05-22 MED ORDER — POTASSIUM CHLORIDE CRYS ER 20 MEQ PO TBCR
20.0000 meq | EXTENDED_RELEASE_TABLET | Freq: Once | ORAL | Status: AC
  Administered 2019-05-22: 20 meq via ORAL
  Filled 2019-05-22: qty 1

## 2019-05-22 NOTE — ED Notes (Signed)
This RN bedside, pt states chest pain "is gone" and reports feeling better. Pt is talking without SHOB. NAD noted, pt denies further needs.

## 2019-05-22 NOTE — ED Triage Notes (Signed)
Chest pain that started approx 0100 this am. Pain radiating down left arm. Pt reports minimal pain now. A/o

## 2019-05-22 NOTE — ED Provider Notes (Signed)
[facility] Emergency Department Provider Note   ____________________________________________   First MD Initiated Contact with Patient 05/22/19 1258     (approximate)  I have reviewed the triage vital signs and the nursing notes.   HISTORY  Chief Complaint Chest Pain    HPI Joseph Peters is a 64 y.o. male with past medical history of CAD, MAC pneumonia, COPD, CHF, and chronic respiratory failure on 3 L nasal cannula who presents to the ED complaining of chest pain.  Patient states that yesterday evening he developed sharp pain over the left side of his chest, worse when he takes a deep breath.  Pain has been constant since then and he describes it as similar to prior episodes of pain attributed to pneumonia.  He complains of some shortness of breath, but states this is similar to what he usually deals with and he has not had any new fevers or cough.  He recently completed a course of Levaquin following admission for HCAP, states he has been doing well since then.  Initial prehospital EKG was concerning for STEMI and code STEMI called prior to arrival.        Past Medical History:  Diagnosis Date  . AAA (abdominal aortic aneurysm) without rupture ([place])   . Abdominal aneurysm ([place]) 2015   being followed but not large enough to surgically treat  . Alcoholism ([place])   . Anxiety   . Arthritis   . Asthma   . Benign hypertension 07/16/2014  . Chest pain on exertion 07/16/2014  . Chronic pain syndrome   . Coccydynia 02/08/2012  . COPD (chronic obstructive pulmonary disease) ([place])    patient smokes  . Coronary artery disease   . 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 Depression   . 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 Dyspnea   . Frequent falls   . GERD (gastroesophageal reflux disease)   . Hyperlipidemia   . IBS (irritable bowel syndrome)   . Myocardial infarction ([place]) 2010  . Neuropathy   . Pneumonia   .  Prostatitis   . Restless leg syndrome   . Sciatica   . Spinal stenosis of lumbar region   . Stroke ([place]) 2015   no residual symptoms  . Tremor, essential     Patient Active Problem List   Diagnosis Date Noted  . Acute on chronic systolic CHF (congestive heart failure) ([place]) 04/28/2019  . Elevated troponin 04/28/2019  . HCAP (healthcare-associated pneumonia) 04/28/2019  . Sepsis ([place]) 04/28/2019  . CAD (coronary artery disease) 04/28/2019  . BPH (benign prostatic hyperplasia) 04/28/2019  . Depression 04/28/2019  . History of MAC infection   . Pleural effusion on right   . SOB (shortness of breath)   . Anxiety about health   . Dyspnea   . Acute on chronic respiratory failure with hypoxia ([place])   . Goals of care, counseling/discussion   . Advanced care planning/counseling discussion   . Palliative care by specialist   . Restless legs syndrome (RLS) 04/01/2019  . Protein-calorie malnutrition, severe 10/15/2018  . Nodule of upper lobe of right lung 10/15/2018  . RLQ abdominal pain 10/14/2018  . Status post total hip replacement, right 07/01/2018  . Strain of right knee 06/26/2018  . Lumbar strain 06/16/2018  . Strain of right hip 06/16/2018  . Hyperlipidemia 01/30/2018  . Rotator cuff tendinitis, right 08/19/2017  . Traumatic complete tear of right rotator cuff 08/19/2017  . Ascending aortic  aneurysm ([place]) 08/01/2017  . Tremor, essential 05/28/2017  . Gastroesophageal reflux disease without esophagitis 12/25/2016  . Renal artery stenosis ([place]) 12/05/2015  . 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 Syncope and collapse 12/05/2015  . Essential hypertension 10/15/2015  . COPD exacerbation ([place]) 01/31/2015  . Acute respiratory failure ([place]) 01/31/2015  . Drug allergy 01/30/2015  . AAA (abdominal aortic aneurysm) ([place]) 11/23/2014  . Prostate cancer screening 11/02/2014  . Gross hematuria 11/02/2014  . Benign hypertension 07/16/2014  . Inflammatory disease of prostate  11/30/2013  . Stroke ([place]) 2015  . 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 Occlusion and stenosis of unspecified carotid artery 05/14/2012  . Coccydynia 02/08/2012  . 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 Gait disorder 02/08/2012  . Spinal stenosis of lumbar region 01/13/2009  . Atherosclerotic heart disease of native coronary artery without angina pectoris 06/28/2008  . Sciatica 06/28/2008  . Recurrent major depressive disorder in partial remission ([place]) 06/28/2008    Past Surgical History:  Procedure Laterality Date  . CARDIAC CATHETERIZATION  2012  . COLONOSCOPY WITH PROPOFOL N/A 11/14/2016   Procedure: COLONOSCOPY WITH PROPOFOL;  Surgeon: Manya Silvas, MD;  Location[facility] ENDOSCOPY;  Service: Endoscopy;  Laterality: N/A;  . ESOPHAGOGASTRODUODENOSCOPY (EGD) WITH PROPOFOL N/A 11/14/2016   Procedure: ESOPHAGOGASTRODUODENOSCOPY (EGD) WITH PROPOFOL;  Surgeon: Manya Silvas, MD;  Location: [facility] ENDOSCOPY;  Service: Endoscopy;  Laterality: N/A;  . JOINT REPLACEMENT Right    hip  . KNEE SURGERY     right  . SHOULDER ARTHROSCOPY WITH OPEN ROTATOR CUFF REPAIR Right 08/20/2017   Procedure: SHOULDER ARTHROSCOPY WITH OPEN ROTATOR CUFF REPAIR;  Surgeon: Corky Mull, MD;  Location: [facility] ORS;  Service: Orthopedics;  Laterality: Right;  . SHOULDER ARTHROSCOPY WITH SUBACROMIAL DECOMPRESSION, ROTATOR CUFF REPAIR AND BICEP TENDON REPAIR Left 05/10/2016   Procedure: SHOULDER ARTHROSCOPY WITH DEBTRIDEMENT, DECOMPRESSION, REPAIR OF MASSIVE ROTATOR CUFF TEAR AND BICEP TENODESIS;  Surgeon: Corky Mull, MD;  Location: [facility] ORS;  Service: Orthopedics;  Laterality: Left;  Massive cuff tear  . TOTAL HIP ARTHROPLASTY Right 07/01/2018   Procedure: TOTAL HIP ARTHROPLASTY - RIGHT;  Surgeon: Corky Mull, MD;  Location: [facility] ORS;  Service: Orthopedics;  Laterality: Right;  Marland Kitchen VIDEO BRONCHOSCOPY WITH ENDOBRONCHIAL NAVIGATION N/A 02/13/2019   Procedure: VIDEO BRONCHOSCOPY WITH ENDOBRONCHIAL  NAVIGATION;  Surgeon: Ottie Glazier, MD;  Location: [facility] ORS;  Service: Thoracic;  Laterality: N/A;  . VIDEO BRONCHOSCOPY WITH ENDOBRONCHIAL NAVIGATION N/A 03/13/2019   Procedure: VIDEO BRONCHOSCOPY WITH ENDOBRONCHIAL NAVIGATION;  Surgeon: Ottie Glazier, MD;  Location: [facility] ORS;  Service: Thoracic;  Laterality: N/A;  . VIDEO BRONCHOSCOPY WITH ENDOBRONCHIAL ULTRASOUND N/A 02/13/2019   Procedure: VIDEO BRONCHOSCOPY WITH ENDOBRONCHIAL ULTRASOUND;  Surgeon: Ottie Glazier, MD;  Location: [facility] ORS;  Service: Thoracic;  Laterality: N/A;  . VIDEO BRONCHOSCOPY WITH ENDOBRONCHIAL ULTRASOUND N/A 03/13/2019   Procedure: VIDEO BRONCHOSCOPY WITH ENDOBRONCHIAL ULTRASOUND;  Surgeon: Ottie Glazier, MD;  Location: [facility] ORS;  Service: Thoracic;  Laterality: N/A;    Prior to Admission medications   Medication Sig Start Date End Date Taking? Authorizing Provider  albuterol (PROVENTIL HFA;VENTOLIN HFA) 108 (90 Base) MCG/ACT inhaler Inhale 2 puffs into the lungs every 4 (four) hours as needed for wheezing or shortness of breath.    Yes [provider]  aspirin EC 81 MG EC tablet Take 1 tablet (81 mg total) by mouth daily. 05/04/19  Yes Nolberto Hanlon, MD  DALIRESP 250 MCG TABS Take 250 mcg by mouth at bedtime. 10/17/18  Yes  Stark Jock Jude, MD  ethambutol (MYAMBUTOL) 400 MG tablet Take 2 tablets (800 mg total) by mouth daily. 04/11/19  Yes Wyvonnia Dusky, MD  finasteride (PROSCAR) 5 MG tablet Take 1 tablet (5 mg total) by mouth daily. 03/02/19  Yes Festus Aloe, MD  FLUoxetine (PROZAC) 20 MG capsule Take 20 mg by mouth daily.    Yes [provider]  Fluticasone-Umeclidin-Vilant (TRELEGY ELLIPTA) 100-62.5-25 MCG/INH AEPB Inhale 2 puffs into the lungs at bedtime.  08/18/18  Yes [provider]  folic acid (FOLVITE) 1 MG tablet Take 1 tablet (1 mg total) by mouth daily. 05/04/19  Yes Nolberto Hanlon, MD  furosemide (LASIX) 40 MG tablet Take 1 tablet (40 mg total) by mouth daily. 05/03/19  Yes Nolberto Hanlon, MD  gabapentin (NEURONTIN) 300 MG capsule Take 900 mg by mouth at bedtime.    Yes [provider]  guaiFENesin-dextromethorphan (ROBITUSSIN DM) 100-10 MG/5ML syrup Take 10 mLs by mouth every 6 (six) hours as needed for up to 10 days for cough. 05/14/19 05/24/19 Yes Val Riles, MD  metoprolol succinate (TOPROL-XL) 25 MG 24 hr tablet Take 0.5 tablets (12.5 mg total) by mouth daily. 05/03/19 06/02/19 Yes Nolberto Hanlon, MD  mirtazapine (REMERON) 7.5 MG tablet Take 1 tablet (7.5 mg total) by mouth at bedtime. 04/23/19 05/23/19 Yes Wyvonnia Dusky, MD  montelukast (SINGULAIR) 10 MG tablet Take 10 mg by mouth at bedtime.   Yes [provider]  Multiple Vitamin (MULTIVITAMIN WITH MINERALS) TABS tablet Take 1 tablet by mouth daily. 05/04/19  Yes Nolberto Hanlon, MD  nicotine (NICODERM CQ - DOSED IN MG/24 HOURS) 21 mg/24hr patch Place 1 patch (21 mg total) onto the skin daily. 05/03/19  Yes Nolberto Hanlon, MD  omeprazole (PRILOSEC) 20 MG capsule Take 20 mg by mouth daily.   Yes [provider]  Oxycodone HCl 10 MG TABS Take 10 mg by mouth 4 (four) times daily as needed for pain. 09/29/18  Yes [provider]  rifampin (RIFADIN) 300 MG capsule Take 2 capsules (600 mg total) by mouth daily. 05/14/19 06/13/19 Yes Val Riles, MD  rosuvastatin (CRESTOR) 5 MG tablet Take 5 mg by mouth at bedtime.   Yes [provider]  tamsulosin (FLOMAX) 0.4 MG CAPS capsule Take 1 capsule (0.4 mg total) by mouth daily after supper. 12/22/18  Yes Festus Aloe, MD  thiamine 100 MG tablet Take 1 tablet (100 mg total) by mouth daily. 05/04/19  Yes Nolberto Hanlon, MD    Allergies Penicillins and Lidocaine  Family History  Problem Relation Age of Onset  . Cancer Mother   . COPD Father 28       black lung    Social History Social History   Tobacco Use  . Smoking status: Light Tobacco Smoker    Packs/day: 0.50    Years: 30.00    Pack years: 15.00    Types: Cigarettes    Start  date: 02/26/1973    Last attempt to quit: 09/21/2018    Years since quitting: 0.6  . Smokeless tobacco: Former Systems developer    Quit date: 02/26/1998  . Tobacco comment: smokes 2-3 cig a week 02/12/19; per pt no longer smokes  Substance Use Topics  . Alcohol use: Yes    Alcohol/week: 20.0 standard drinks    Types: 20 Cans of beer per week    Comment: week   . Drug use: Yes    Comment: prescribed hydrocodone    Review of Systems  Constitutional: No fever/chills Eyes: No  visual changes. ENT: No sore throat. Cardiovascular: Positive for chest pain. Respiratory: Denies shortness of breath. Gastrointestinal: No abdominal pain.  No nausea, no vomiting.  No diarrhea.  No constipation. Genitourinary: Negative for dysuria. Musculoskeletal: Negative for back pain. Skin: Negative for rash. Neurological: Negative for headaches, focal weakness or numbness.  ____________________________________________   PHYSICAL EXAM:  VITAL SIGNS: ED Triage Vitals  Enc Vitals Group     BP      Pulse      Resp      Temp      Temp src      SpO2      Weight      Height      Head Circumference      Peak Flow      Pain Score      Pain Loc      Pain Edu?      Excl. in [place]?     Constitutional: Alert and oriented. Eyes: Conjunctivae are normal. Head: Atraumatic. Nose: No congestion/rhinnorhea. Mouth/Throat: Mucous membranes are moist. Neck: Normal ROM Cardiovascular: Tachycardic, regular rhythm. Grossly normal heart sounds. Respiratory: Normal respiratory effort.  No retractions. Lungs CTAB. Gastrointestinal: Soft and nontender. No distention. Genitourinary: deferred Musculoskeletal: No lower extremity tenderness nor edema. Neurologic:  Normal speech and language. No gross focal neurologic deficits are appreciated. Skin:  Skin is warm, dry and intact. No rash noted. Psychiatric: Mood and affect are normal. Speech and behavior are normal.  ____________________________________________   LABS (all  labs ordered are listed, but only abnormal results are displayed)  Labs Reviewed  CBC WITH DIFFERENTIAL/PLATELET - Abnormal; Notable for the following components:      Result Value   RBC 3.85 (*)    Hemoglobin 11.5 (*)    HCT 34.2 (*)    All other components within normal limits  COMPREHENSIVE METABOLIC PANEL - Abnormal; Notable for the following components:   Potassium 3.3 (*)    Calcium 7.9 (*)    Total Protein 6.2 (*)    Albumin 2.6 (*)    AST 49 (*)    ALT 66 (*)    All other components within normal limits  TROPONIN I (HIGH SENSITIVITY) - Abnormal; Notable for the following components:   Troponin I (High Sensitivity) 48 (*)    All other components within normal limits  TROPONIN I (HIGH SENSITIVITY)   ____________________________________________  EKG  ED ECG REPORT I, Blake Divine, the attending physician, personally viewed and interpreted this ECG.   Date: 05/22/2019  EKG Time: 10:22  Rate: 117  Rhythm: sinus tachycardia  Axis: LAD  Intervals:left anterior fascicular block  ST&T Change: LVH, lateral T wave inversions similar to prior  ED ECG REPORT I, Blake Divine, the attending physician, personally viewed and interpreted this ECG.   Date: 05/22/2019  EKG Time: 12:53  Rate: 81  Rhythm: normal sinus rhythm  Axis: LAD  Intervals:left anterior fascicular block  ST&T Change: LVH, lateral T wave inversions unchanged    PROCEDURES  Procedure(s) performed (including Critical Care):  Procedures   ____________________________________________   INITIAL IMPRESSION / ASSESSMENT AND PLAN / ED COURSE      65 year old male with history of chronic respiratory failure on 3 L nasal cannula, MAC pneumonia, CAD, COPD, and CHF presents to the ED with pleuritic left-sided chest pain since last night.  Initial prehospital EKG was concerning for STEMI, however his EKG seems to have normalized following arrival in the ED with no ST elevations.  His symptoms also seem  to have improved and he complains of only mild pain at this time similar to prior episodes of pleuritic pain.  STEMI alert was canceled per cardiology, plan for usual cardiac work-up.  PE seems less likely given reassuring vital signs and CT scan negative for PE 11 days prior.  He also seems to be doing clinically better following completion of treatment for HCAP, afebrile with reassuring vital signs here.   Chest x-ray appears similar to prior and remainder of labs are remarkable only for mild hypokalemia.  No leukocytosis noted and patient reports feeling much better following breathing treatment.  Troponin is mildly elevated at 48, however this seems similar to his baseline.  Case was again discussed with Dr. Saralyn Pilar of cardiology, who agrees with plan for discharge home with outpatient follow-up if repeat troponin stable.  Patient turned over to oncoming provider pending repeat troponin.      ____________________________________________   FINAL CLINICAL IMPRESSION(S) / ED DIAGNOSES  Final diagnoses:  Atypical chest pain  Chronic respiratory failure with hypoxia ([place])  History of MAC infection     ED Discharge Orders    None       Note:  This document was prepared using Dragon voice recognition software and may include unintentional dictation errors.   Blake Divine, MD 05/22/19 1537

## 2019-05-22 NOTE — Consult Note (Signed)
[facility] Cardiology  CARDIOLOGY CONSULT NOTE  Patient ID: Joseph Peters MRN: [id number] DOB/AGE: 1955/08/11 64 y.o.  Admit date: 05/22/2019 Referring Physician Charna Archer Primary Physician [place] Primary Cardiologist [facility] Reason for Consultation chest pain  HPI: 64 year old gentleman referred for evaluation for possible STEMI.  Patient apparently was in his usual state of health until last evening, over 12 hours ago, when he developed left-sided, left flank, pleuritic chest discomfort with associated shortness of breath.  He presents to [facility] ED where ECG reveals sinus rhythm, anterior Q waves, borderline ST elevation in leads V2 and V3, with left ventricular hypertrophy, which appears unchanged compared to prior ECG.  The patient currently denies substernal chest pain and reports that his chest discomfort has improved overall since last evening.  The patient just arrived at [facility].  Lab work is pending.  The patient was recently hospitalized 04/28/2019 with respiratory failure, likely multifactorial, with known vera fibrocavitary MAC, COPD, possible hospital-acquired pneumonia, and acute on chronic systolic congestive heart failure.  The patient was discharged home 05/14/2019.  2D echocardiogram 04/08/2019 revealed LVEF of 35 to 40%.  Review of systems complete and found to be negative unless listed above     Past Medical History:  Diagnosis Date  . AAA (abdominal aortic aneurysm) without rupture ([place])   . Abdominal aneurysm ([place]) 2015   being followed but not large enough to surgically treat  . Alcoholism ([place])   . Anxiety   . Arthritis   . Asthma   . Benign hypertension 07/16/2014  . Chest pain on exertion 07/16/2014  . Chronic pain syndrome   . Coccydynia 02/08/2012  . COPD (chronic obstructive pulmonary disease) ([place])    patient smokes  . Coronary artery disease   . 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 Depression   . Disc disease with myelopathy, thoracic  02/03/2013  . Dyspnea   . Frequent falls   . GERD (gastroesophageal reflux disease)   . Hyperlipidemia   . IBS (irritable bowel syndrome)   . Myocardial infarction ([place]) 2010  . Neuropathy   . Pneumonia   . Prostatitis   . Restless leg syndrome   . Sciatica   . Spinal stenosis of lumbar region   . Stroke ([place]) 2015   no residual symptoms  . Tremor, essential     Past Surgical History:  Procedure Laterality Date  . CARDIAC CATHETERIZATION  2012  . COLONOSCOPY WITH PROPOFOL N/A 11/14/2016   Procedure: COLONOSCOPY WITH PROPOFOL;  Surgeon: Manya Silvas, MD;  Location: [facility] ENDOSCOPY;  Service: Endoscopy;  Laterality: N/A;  . ESOPHAGOGASTRODUODENOSCOPY (EGD) WITH PROPOFOL N/A 11/14/2016   Procedure: ESOPHAGOGASTRODUODENOSCOPY (EGD) WITH PROPOFOL;  Surgeon: Manya Silvas, MD;  Location[facility] ENDOSCOPY;  Service: Endoscopy;  Laterality: N/A;  . JOINT REPLACEMENT Right    hip  . KNEE SURGERY     right  . SHOULDER ARTHROSCOPY WITH OPEN ROTATOR CUFF REPAIR Right 08/20/2017   Procedure: SHOULDER ARTHROSCOPY WITH OPEN ROTATOR CUFF REPAIR;  Surgeon: Corky Mull, MD;  Location: [facility] ORS;  Service: Orthopedics;  Laterality: Right;  . SHOULDER ARTHROSCOPY WITH SUBACROMIAL DECOMPRESSION, ROTATOR CUFF REPAIR AND BICEP TENDON REPAIR Left 05/10/2016   Procedure: SHOULDER ARTHROSCOPY WITH DEBTRIDEMENT, DECOMPRESSION, REPAIR OF MASSIVE ROTATOR CUFF TEAR AND BICEP TENODESIS;  Surgeon: Corky Mull, MD;  Location: [facility] ORS;  Service: Orthopedics;  Laterality: Left;  Massive cuff tear  . TOTAL HIP ARTHROPLASTY Right 07/01/2018   Procedure: TOTAL HIP ARTHROPLASTY - RIGHT;  Surgeon:  Poggi, Marshall Cork, MD;  Location: [facility] ORS;  Service: Orthopedics;  Laterality: Right;  Marland Kitchen VIDEO BRONCHOSCOPY WITH ENDOBRONCHIAL NAVIGATION N/A 02/13/2019   Procedure: VIDEO BRONCHOSCOPY WITH ENDOBRONCHIAL NAVIGATION;  Surgeon: Ottie Glazier, MD;  Location: [facility] ORS;  Service: Thoracic;  Laterality: N/A;  . VIDEO BRONCHOSCOPY  WITH ENDOBRONCHIAL NAVIGATION N/A 03/13/2019   Procedure: VIDEO BRONCHOSCOPY WITH ENDOBRONCHIAL NAVIGATION;  Surgeon: Ottie Glazier, MD;  Location: [facility] ORS;  Service: Thoracic;  Laterality: N/A;  . VIDEO BRONCHOSCOPY WITH ENDOBRONCHIAL ULTRASOUND N/A 02/13/2019   Procedure: VIDEO BRONCHOSCOPY WITH ENDOBRONCHIAL ULTRASOUND;  Surgeon: Ottie Glazier, MD;  Location: [facility] ORS;  Service: Thoracic;  Laterality: N/A;  . VIDEO BRONCHOSCOPY WITH ENDOBRONCHIAL ULTRASOUND N/A 03/13/2019   Procedure: VIDEO BRONCHOSCOPY WITH ENDOBRONCHIAL ULTRASOUND;  Surgeon: Ottie Glazier, MD;  Location: [facility] ORS;  Service: Thoracic;  Laterality: N/A;    (Not in a hospital admission)  Social History   Socioeconomic History  . Marital status: Married    Spouse name: Not on file  . Number of children: Not on file  . Years of education: Not on file  . Highest education level: Not on file  Occupational History  . Not on file  Tobacco Use  . Smoking status: Light Tobacco Smoker    Packs/day: 0.50    Years: 30.00    Pack years: 15.00    Types: Cigarettes    Start date: 02/26/1973    Last attempt to quit: 09/21/2018    Years since quitting: 0.6  . Smokeless tobacco: Former Systems developer    Quit date: 02/26/1998  . Tobacco comment: smokes 2-3 cig a week 02/12/19; per pt no longer smokes  Substance and Sexual Activity  . Alcohol use: Yes    Alcohol/week: 20.0 standard drinks    Types: 20 Cans of beer per week    Comment: week   . Drug use: Yes    Comment: prescribed hydrocodone  . Sexual activity: Not on file  Other Topics Concern  . Not on file  Social History Narrative   Patient is a Nature conservation officer.;  He lives with his wife at home.  Longstanding history of smoking; quit recently.  Denies alcohol abuse.   Social Determinants of Health   Financial Resource Strain:   . Difficulty of Paying Living Expenses:   Food Insecurity:   . Worried About Charity fundraiser in the Last Year:   . Arboriculturist in the  Last Year:   Transportation Needs:   . Film/video editor (Medical):   Marland Kitchen Lack of Transportation (Non-Medical):   Physical Activity:   . Days of Exercise per Week:   . Minutes of Exercise per Session:   Stress:   . Feeling of Stress :   Social Connections:   . Frequency of Communication with Friends and Family:   . Frequency of Social Gatherings with Friends and Family:   . Attends Religious Services:   . Active Member of Clubs or Organizations:   . Attends Archivist Meetings:   Marland Kitchen Marital Status:   Intimate Partner Violence:   . Fear of Current or Ex-Partner:   . Emotionally Abused:   Marland Kitchen Physically Abused:   . Sexually Abused:     Family History  Problem Relation Age of Onset  . Cancer Mother   . COPD Father 28       black lung      Review of systems complete and found to be negative unless listed above  PHYSICAL EXAM  General: Well developed, well nourished, in no acute distress HEENT:  Normocephalic and atramatic Neck:  No JVD.  Lungs: Clear bilaterally to auscultation and percussion. Heart: HRRR . Normal S1 and S2 without gallops or murmurs.  Abdomen: Bowel sounds are positive, abdomen soft and non-tender  Msk:  Back normal, normal gait. Normal strength and tone for age. Extremities: No clubbing, cyanosis or edema.   Neuro: Alert and oriented X 3. Psych:  Good affect, responds appropriately  Labs:   Lab Results  Component Value Date   WBC 9.0 05/14/2019   HGB 11.1 (L) 05/14/2019   HCT 35.3 (L) 05/14/2019   MCV 90.7 05/14/2019   PLT 222 05/14/2019   No results for input(s): NA, K, CL, CO2, BUN, CREATININE, CALCIUM, PROT, BILITOT, ALKPHOS, ALT, AST, GLUCOSE in the last 168 hours.  Invalid input(s): LABALBU Lab Results  Component Value Date   TROPONINI <0.03 06/15/2016    Lab Results  Component Value Date   CHOL 218 (H) 04/29/2019   Lab Results  Component Value Date   HDL 37 (L) 04/29/2019   Lab Results  Component Value Date    LDLCALC 161 (H) 04/29/2019   Lab Results  Component Value Date   TRIG 99 04/29/2019   Lab Results  Component Value Date   CHOLHDL 5.9 04/29/2019   No results found for: LDLDIRECT    Radiology: DG Chest 1 View  Result Date: 05/02/2019 CLINICAL DATA:  Pt states SOB, chest pain, cough for 3 months. Pt states diagnosed with double pneumonia last month. Hx of asthma, hypertension, copd, myocardial infarction, pneumonia. Current smoker EXAM: CHEST  1 VIEW COMPARISON:  Chest radiograph 04/28/2019 FINDINGS: Stable cardiomediastinal contours. Lungs are hyperinflated. Emphysema. Persistent consolidation throughout the right upper lobe. Interval mild decrease in bilateral diffuse opacities in the right lower lung and left mid to lower lung. No new focal opacity. No pneumothorax or large pleural effusion. No acute finding in the visualized skeleton. IMPRESSION: Persistent consolidation in the right upper lobe. Interval improvement in diffuse patchy opacities seen in the right lower and left mid to lower lungs. Electronically Signed   By: Audie Pinto M.D.   On: 05/02/2019 14:02   CT ANGIO CHEST PE W OR WO CONTRAST  Result Date: 05/11/2019 CLINICAL DATA:  Shortness of breath and cough.  Worsening dyspnea. EXAM: CT ANGIOGRAPHY CHEST WITH CONTRAST TECHNIQUE: Multidetector CT imaging of the chest was performed using the standard protocol during bolus administration of intravenous contrast. Multiplanar CT image reconstructions and MIPs were obtained to evaluate the vascular anatomy. CONTRAST:  61m OMNIPAQUE IOHEXOL 350 MG/ML SOLN COMPARISON:  04/28/2019 FINDINGS: Cardiovascular: The heart size is normal. No substantial pericardial effusion. Coronary artery calcification is evident. Ascending thoracic aorta measures 4.3 cm diameter. Atherosclerotic calcification is noted in the wall of the thoracic aorta. No filling defect within the opacified pulmonary arteries to suggest the presence of an acute pulmonary  embolus. Mediastinum/Nodes: Scattered small mediastinal lymph nodes again noted. No left hilar adenopathy. Stable soft tissue attenuation in the right hilar region. The esophagus has normal imaging features. There is no axillary lymphadenopathy. Lungs/Pleura: Centrilobular and paraseptal emphysema evident. The right upper lobe collapse/consolidative opacity is stable with cavitary component visible on 28/7. Similar architectural distortion, scarring, and airspace disease in the right lower lobe, lingula, and left lower lobe. Small bilateral pleural effusions again noted. Upper Abdomen: Mild fullness noted left upper pole renal collecting system although renal pelvis and entire left kidney is not been visualized.  Musculoskeletal: No worrisome lytic or sclerotic osseous abnormality. Mild inferior endplate compression deformity of a midthoracic vertebral body is stable. Review of the MIP images confirms the above findings. IMPRESSION: 1. No CT evidence for acute pulmonary embolus. 2. Similar appearance of the bilateral airspace disease with confluent consolidative change in the right lung apex demonstrating small cavitary component. Imaging features remain suspicious for multifocal pneumonia although neoplasm cannot be excluded. 3. Persistent small bilateral pleural effusions. 4. Ascending thoracic aorta measures 4.3 cm diameter. Recommend annual imaging followup by CTA or MRA. This recommendation follows 2010 ACCF/AHA/AATS/ACR/ASA/SCA/SCAI/SIR/STS/SVM Guidelines for the Diagnosis and Management of Patients with Thoracic Aortic Disease. Circulation. 2010; 121: D782-U235. Aortic aneurysm NOS (ICD10-I71.9) 5.  Emphysema (ICD10-J43.9) and Aortic Atherosclerosis (ICD10-170.0) Electronically Signed   By: Misty Stanley M.D.   On: 05/11/2019 16:31   CT Angio Chest PE W/Cm &/Or Wo Cm  Result Date: 04/28/2019 CLINICAL DATA:  Short of breath.  Tachycardia. EXAM: CT ANGIOGRAPHY CHEST WITH CONTRAST TECHNIQUE: Multidetector CT  imaging of the chest was performed using the standard protocol during bolus administration of intravenous contrast. Multiplanar CT image reconstructions and MIPs were obtained to evaluate the vascular anatomy. CONTRAST:  75 mL Omnipaque COMPARISON:  Chest CT 04/16/2019 FINDINGS: Cardiovascular: No filling defects within the pulmonary arteries to suggest acute pulmonary embolism. Ascending aorta is aneurysmal at 42 mm (image 30/7). Aneurysm is fusiform. No axillary supraclavicular adenopathy. No mediastinal hilar adenopathy. No pericardial effusion. Mediastinum/Nodes: No axillary supraclavicular adenopathy. No mediastinal adenopathy. Lungs/Pleura: Severe centrilobular emphysema. Superimposed consolidation in the RIGHT upper lobe not changed from prior. Small central cavitation is also similar (image 26/6). Pleural fluid at the RIGHT lung base and airspace disease in the RIGHT lower lobe is also similar. Within the LEFT lung, moderate pleural effusion. New airspace disease in the lower lobe and lingula. These findings in LEFT lung are worsened from comparison exam. Upper Abdomen: Limited view of the liver, kidneys, pancreas are unremarkable. Normal adrenal glands. Musculoskeletal: No aggressive osseous lesion. Review of the MIP images confirms the above findings. IMPRESSION: 1. No evidence acute pulmonary embolism. 2. Worsening airspace disease in the lingula and LEFT lower lobe suggesting pulmonary edema versus pneumonia. 3. Stable dense consolidation in the RIGHT upper lobe and diffuse airspace disease in the RIGHT lower lobe suggesting multifocal pneumonia. 4. Bilateral pleural effusions unchanged. 5. Ascending thoracic aortic aneurysm. Recommend annual imaging followup by CTA or MRA. This recommendation follows 2010 ACCF/AHA/AATS/ACR/ASA/SCA/SCAI/SIR/STS/SVM Guidelines for the Diagnosis and Management of Patients with Thoracic Aortic Disease. Circulation. 2010; 121: T614-E315. Aortic aneurysm NOS (ICD10-I71.9)  Electronically Signed   By: Suzy Bouchard M.D.   On: 04/28/2019 12:47   DG Chest Port 1 View  Result Date: 05/11/2019 CLINICAL DATA:  Shortness of breath EXAM: PORTABLE CHEST 1 VIEW COMPARISON:  May 02, 2019 chest radiograph as well as chest radiograph and chest CT April 28, 2019 FINDINGS: There remains underlying fibrosis bilaterally. There is consolidation with volume loss in the right upper lobe, similar to recent studies. There is patchy ill-defined opacity throughout the left mid and lower lung zones and right base, likely foci of multifocal pneumonia superimposed on fibrosis. There is a fairly small pleural effusion on each side. No new opacity evident compared to recent studies. Heart size is within normal limits. There is distortion of pulmonary vascularity on the right. The pulmonary vascularity on the left is grossly normal. No adenopathy is appreciable by radiography. No focal bone lesions. IMPRESSION: Underlying fibrosis. Consolidation right upper lobe with volume loss,  stable. Suspect patchy pneumonia elsewhere, stable from recent studies. Small pleural effusion bilaterally. Stable cardiac silhouette. Electronically Signed   By: Lowella Grip III M.D.   On: 05/11/2019 11:40   DG Chest Portable 1 View  Result Date: 04/28/2019 CLINICAL DATA:  Shortness of breath. EXAM: PORTABLE CHEST 1 VIEW COMPARISON:  04/27/2019 FINDINGS: The cardiac silhouette, mediastinal and hilar contours are within normal limits and stable. Stable tortuosity of the thoracic aorta. Stable underlying chronic lung disease with emphysema and pulmonary scarring. Persistent superimposed bilateral infiltrates with persistent right upper lobe airspace consolidation and air bronchograms. Small right pleural effusion. No pneumothorax. IMPRESSION: Stable appearing underlying chronic lung disease/emphysema and pulmonary scarring with superimposed infiltrates and small right effusion. Electronically Signed   By: Marijo Sanes M.D.    On: 04/28/2019 10:56   DG Chest Portable 1 View  Result Date: 04/27/2019 CLINICAL DATA:  Shortness of breath EXAM: PORTABLE CHEST 1 VIEW COMPARISON:  04/22/2019 FINDINGS: Increased density at the right lung base. Stable right pleural effusion. Stable fibrotic changes. Cardiomediastinal contours and volume loss in the right chest are unchanged. IMPRESSION: Increased patchy atelectasis/consolidation at the right lung base. Otherwise stable appearance. Electronically Signed   By: Macy Mis M.D.   On: 04/27/2019 14:40    EKG: Sinus rhythm, anterior Q waves, nondiagnostic ST elevation in leads V2 and V3, LVH, appears similar to prior ECG  ASSESSMENT AND PLAN:   1.  Chest pain, atypical in nature, pleuritic, and patient with history of severe fibrocavitary MAC, and recent community-acquired pneumonia, with underlying COPD, greater than 12 hours in duration, with nondiagnostic ECG 2.  Dilated cardiomyopathy, LVEF 35 to 40% 3.  Recent hospitalization for respiratory failure, multifactorial  Recommendations  1.  Cancel code STEMI  2.  Further recommendations pending initial ER work-up and patient clinical course  Signed: Isaias Cowman MD,PhD, [facility] 05/22/2019, 1:09 PM

## 2019-05-22 NOTE — Progress Notes (Signed)
Ch visited with Pt after seeing Pt was in the room with his face in his hands. Pt reported to Ch that he has had pneumonia for more than a year now. Pt states that they are changing his medication this time. Pt seemed very concerned about health. Pt asked for his phone from his bag to call wife. Pt recollected visits with [place]. [place] fondly calling him a friend who has been around for several weeks of hospital admissions here. Pt was grateful for visit.

## 2019-05-22 NOTE — Discharge Instructions (Addendum)
Please seek medical attention for any high fevers, chest pain, shortness of breath, change in behavior, persistent vomiting, bloody stool or any other new or concerning symptoms.  

## 2019-05-23 ENCOUNTER — Encounter: Payer: Self-pay | Admitting: Emergency Medicine

## 2019-05-23 ENCOUNTER — Emergency Department: Payer: Medicare Other

## 2019-05-23 ENCOUNTER — Emergency Department
Admission: EM | Admit: 2019-05-23 | Discharge: 2019-05-23 | Disposition: A | Discharge status: Home / Self Care | Payer: Medicare Other | Attending: Emergency Medicine | Admitting: Emergency Medicine

## 2019-05-23 DIAGNOSIS — I5022 Chronic systolic (congestive) heart failure: Secondary | ICD-10-CM | POA: Insufficient documentation

## 2019-05-23 DIAGNOSIS — I2510 Atherosclerotic heart disease of native coronary artery without angina pectoris: Secondary | ICD-10-CM | POA: Insufficient documentation

## 2019-05-23 DIAGNOSIS — R05 Cough: Secondary | ICD-10-CM | POA: Diagnosis not present

## 2019-05-23 DIAGNOSIS — J45909 Unspecified asthma, uncomplicated: Secondary | ICD-10-CM | POA: Insufficient documentation

## 2019-05-23 DIAGNOSIS — J9611 Chronic respiratory failure with hypoxia: Secondary | ICD-10-CM | POA: Insufficient documentation

## 2019-05-23 DIAGNOSIS — Z79899 Other long term (current) drug therapy: Secondary | ICD-10-CM | POA: Diagnosis not present

## 2019-05-23 DIAGNOSIS — G8929 Other chronic pain: Secondary | ICD-10-CM | POA: Diagnosis not present

## 2019-05-23 DIAGNOSIS — I714 Abdominal aortic aneurysm, without rupture, unspecified: Secondary | ICD-10-CM

## 2019-05-23 DIAGNOSIS — J449 Chronic obstructive pulmonary disease, unspecified: Secondary | ICD-10-CM | POA: Insufficient documentation

## 2019-05-23 DIAGNOSIS — I110 Hypertensive heart disease with heart failure: Secondary | ICD-10-CM | POA: Diagnosis not present

## 2019-05-23 DIAGNOSIS — R0789 Other chest pain: Secondary | ICD-10-CM | POA: Diagnosis present

## 2019-05-23 DIAGNOSIS — M25511 Pain in right shoulder: Secondary | ICD-10-CM | POA: Insufficient documentation

## 2019-05-23 DIAGNOSIS — R079 Chest pain, unspecified: Secondary | ICD-10-CM

## 2019-05-23 DIAGNOSIS — Z96641 Presence of right artificial hip joint: Secondary | ICD-10-CM | POA: Diagnosis not present

## 2019-05-23 DIAGNOSIS — F17210 Nicotine dependence, cigarettes, uncomplicated: Secondary | ICD-10-CM | POA: Diagnosis not present

## 2019-05-23 DIAGNOSIS — Z7982 Long term (current) use of aspirin: Secondary | ICD-10-CM | POA: Diagnosis not present

## 2019-05-23 LAB — CBC WITH DIFFERENTIAL/PLATELET
Abs Immature Granulocytes: 0.05 10*3/uL (ref 0.00–0.07)
Basophils Absolute: 0 10*3/uL (ref 0.0–0.1)
Basophils Relative: 0 %
Eosinophils Absolute: 0.1 10*3/uL (ref 0.0–0.5)
Eosinophils Relative: 1 %
HCT: 38.5 % — ABNORMAL LOW (ref 39.0–52.0)
Hemoglobin: 12.6 g/dL — ABNORMAL LOW (ref 13.0–17.0)
Immature Granulocytes: 1 %
Lymphocytes Relative: 23 %
Lymphs Abs: 2.1 10*3/uL (ref 0.7–4.0)
MCH: 29.7 pg (ref 26.0–34.0)
MCHC: 32.7 g/dL (ref 30.0–36.0)
MCV: 90.8 fL (ref 80.0–100.0)
Monocytes Absolute: 0.9 10*3/uL (ref 0.1–1.0)
Monocytes Relative: 10 %
Neutro Abs: 5.8 10*3/uL (ref 1.7–7.7)
Neutrophils Relative %: 65 %
Platelets: 151 10*3/uL (ref 150–400)
RBC: 4.24 MIL/uL (ref 4.22–5.81)
RDW: 15.8 % — ABNORMAL HIGH (ref 11.5–15.5)
WBC: 9 10*3/uL (ref 4.0–10.5)
nRBC: 0 % (ref 0.0–0.2)

## 2019-05-23 LAB — TROPONIN I (HIGH SENSITIVITY)
Troponin I (High Sensitivity): 55 ng/L — ABNORMAL HIGH (ref <18)
Troponin I (High Sensitivity): 62 ng/L — ABNORMAL HIGH (ref <18)

## 2019-05-23 LAB — BASIC METABOLIC PANEL
Anion gap: 14 (ref 5–15)
BUN: 22 mg/dL (ref 8–23)
CO2: 22 mmol/L (ref 22–32)
Calcium: 8.1 mg/dL — ABNORMAL LOW (ref 8.9–10.3)
Chloride: 96 mmol/L — ABNORMAL LOW (ref 98–111)
Creatinine, Ser: 1.51 mg/dL — ABNORMAL HIGH (ref 0.61–1.24)
GFR calc Af Amer: 57 mL/min — ABNORMAL LOW (ref >60)
GFR calc non Af Amer: 49 mL/min — ABNORMAL LOW (ref >60)
Glucose, Bld: 83 mg/dL (ref 70–99)
Potassium: 4.4 mmol/L (ref 3.5–5.1)
Sodium: 132 mmol/L — ABNORMAL LOW (ref 135–145)

## 2019-05-23 LAB — ACID FAST CULTURE WITH REFLEXED SENSITIVITIES (MYCOBACTERIA): Acid Fast Culture: NEGATIVE

## 2019-05-23 MED ORDER — IOHEXOL 350 MG/ML SOLN
75.0000 mL | Freq: Once | INTRAVENOUS | Status: AC | PRN
  Administered 2019-05-23: 75 mL via INTRAVENOUS

## 2019-05-23 MED ORDER — LACTATED RINGERS IV BOLUS
500.0000 mL | Freq: Once | INTRAVENOUS | Status: AC
  Administered 2019-05-23: 500 mL via INTRAVENOUS

## 2019-05-23 MED ORDER — LIDOCAINE 5 % EX PTCH: 1.0000 | MEDICATED_PATCH | Freq: Two times a day (BID) | CUTANEOUS | 0 refills | Status: AC

## 2019-05-23 MED ORDER — OXYCODONE-ACETAMINOPHEN 5-325 MG PO TABS
1.0000 | ORAL_TABLET | Freq: Once | ORAL | Status: AC
  Administered 2019-05-23: 1 via ORAL
  Filled 2019-05-23: qty 1

## 2019-05-23 MED ORDER — LIDOCAINE 5 % EX PTCH
1.0000 | MEDICATED_PATCH | CUTANEOUS | Status: DC
  Administered 2019-05-23: 10:00:00 1 via TRANSDERMAL
  Filled 2019-05-23: qty 1

## 2019-05-23 NOTE — ED Provider Notes (Signed)
[facility] Emergency Department Provider Note   ____________________________________________   First MD Initiated Contact with Patient 05/23/19 [phone]     (approximate)  I have reviewed the triage vital signs and the nursing notes.   HISTORY  Chief Complaint Chest Pain    HPI Joseph Peters is a 64 y.o. male with past medical history of AAA, CAD, hypertension, COPD, MAC pneumonia, CHF, and chronic respiratory failure on 3 L nasal cannula presents to the ED complaining of chest pain.  Patient states that since leaving the ED yesterday he has had worsening left-sided chest pain.  He describes it as sharp and pleuritic, worse when he takes a deep breath.  He also feels like his breathing has been getting worse since yesterday with increased productive cough and difficulty catching his breath despite using breathing treatments at home.  Pain is similar to what he has been dealing with for a long time, but he feels like it is getting worse despite being compliant with antibiotics for MAC.  He recently completed a course of Levaquin for HCAP and states he was feeling better prior to his symptoms worsening again.  He was seen in the ED yesterday for similar symptoms, where there was concern for MI but this was ruled out.        Past Medical History:  Diagnosis Date  . AAA (abdominal aortic aneurysm) without rupture ([place])   . Abdominal aneurysm ([place]) 2015   being followed but not large enough to surgically treat  . Alcoholism ([place])   . Anxiety   . Arthritis   . Asthma   . Benign hypertension 07/16/2014  . Chest pain on exertion 07/16/2014  . Chronic pain syndrome   . Coccydynia 02/08/2012  . COPD (chronic obstructive pulmonary disease) ([place])    patient smokes  . Coronary artery disease   . 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 Depression   . 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 Dyspnea   . Frequent falls   . GERD  (gastroesophageal reflux disease)   . Hyperlipidemia   . IBS (irritable bowel syndrome)   . Myocardial infarction ([place]) 2010  . Neuropathy   . Pneumonia   . Prostatitis   . Restless leg syndrome   . Sciatica   . Spinal stenosis of lumbar region   . Stroke ([place]) 2015   no residual symptoms  . Tremor, essential     Patient Active Problem List   Diagnosis Date Noted  . Acute on chronic systolic CHF (congestive heart failure) ([place]) 04/28/2019  . Elevated troponin 04/28/2019  . HCAP (healthcare-associated pneumonia) 04/28/2019  . Sepsis ([place]) 04/28/2019  . CAD (coronary artery disease) 04/28/2019  . BPH (benign prostatic hyperplasia) 04/28/2019  . Depression 04/28/2019  . History of MAC infection   . Pleural effusion on right   . SOB (shortness of breath)   . Anxiety about health   . Dyspnea   . Acute on chronic respiratory failure with hypoxia ([place])   . Goals of care, counseling/discussion   . Advanced care planning/counseling discussion   . Palliative care by specialist   . Restless legs syndrome (RLS) 04/01/2019  . Protein-calorie malnutrition, severe 10/15/2018  . Nodule of upper lobe of right lung 10/15/2018  . RLQ abdominal pain 10/14/2018  . Status post total hip replacement, right 07/01/2018  . Strain of right knee 06/26/2018  . Lumbar strain 06/16/2018  . Strain of right hip 06/16/2018  .  Hyperlipidemia 01/30/2018  . Rotator cuff tendinitis, right 08/19/2017  . Traumatic complete tear of right rotator cuff 08/19/2017  . Ascending aortic aneurysm ([place]) 08/01/2017  . Tremor, essential 05/28/2017  . Gastroesophageal reflux disease without esophagitis 12/25/2016  . Renal artery stenosis ([place]) 12/05/2015  . 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 Syncope and collapse 12/05/2015  . Essential hypertension 10/15/2015  . COPD exacerbation ([place]) 01/31/2015  . Acute respiratory failure ([place]) 01/31/2015  . Drug allergy 01/30/2015  . AAA (abdominal aortic aneurysm) ([place])  11/23/2014  . Prostate cancer screening 11/02/2014  . Gross hematuria 11/02/2014  . Benign hypertension 07/16/2014  . 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 Stroke ([place]) 2015  . 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 Occlusion and stenosis of unspecified carotid artery 05/14/2012  . Coccydynia 02/08/2012  . 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 Gait disorder 02/08/2012  . Spinal stenosis of lumbar region 01/13/2009  . Atherosclerotic heart disease of native coronary artery without angina pectoris 06/28/2008  . Sciatica 06/28/2008  . Recurrent major depressive disorder in partial remission ([place]) 06/28/2008    Past Surgical History:  Procedure Laterality Date  . CARDIAC CATHETERIZATION  2012  . COLONOSCOPY WITH PROPOFOL N/A 11/14/2016   Procedure: COLONOSCOPY WITH PROPOFOL;  Surgeon: Manya Silvas, MD;  Location: [facility] ENDOSCOPY;  Service: Endoscopy;  Laterality: N/A;  . ESOPHAGOGASTRODUODENOSCOPY (EGD) WITH PROPOFOL N/A 11/14/2016   Procedure: ESOPHAGOGASTRODUODENOSCOPY (EGD) WITH PROPOFOL;  Surgeon: Manya Silvas, MD;  Location: [facility] ENDOSCOPY;  Service: Endoscopy;  Laterality: N/A;  . JOINT REPLACEMENT Right    hip  . KNEE SURGERY     right  . SHOULDER ARTHROSCOPY WITH OPEN ROTATOR CUFF REPAIR Right 08/20/2017   Procedure: SHOULDER ARTHROSCOPY WITH OPEN ROTATOR CUFF REPAIR;  Surgeon: Corky Mull, MD;  Location: [facility] ORS;  Service: Orthopedics;  Laterality: Right;  . SHOULDER ARTHROSCOPY WITH SUBACROMIAL DECOMPRESSION, ROTATOR CUFF REPAIR AND BICEP TENDON REPAIR Left 05/10/2016   Procedure: SHOULDER ARTHROSCOPY WITH DEBTRIDEMENT, DECOMPRESSION, REPAIR OF MASSIVE ROTATOR CUFF TEAR AND BICEP TENODESIS;  Surgeon: Corky Mull, MD;  Location: [facility] ORS;  Service: Orthopedics;  Laterality: Left;  Massive cuff tear  . TOTAL HIP ARTHROPLASTY Right 07/01/2018   Procedure: TOTAL HIP ARTHROPLASTY - RIGHT;  Surgeon: Corky Mull, MD;  Location: [facility] ORS;  Service:  Orthopedics;  Laterality: Right;  Marland Kitchen VIDEO BRONCHOSCOPY WITH ENDOBRONCHIAL NAVIGATION N/A 02/13/2019   Procedure: VIDEO BRONCHOSCOPY WITH ENDOBRONCHIAL NAVIGATION;  Surgeon: Ottie Glazier, MD;  Location: [facility] ORS;  Service: Thoracic;  Laterality: N/A;  . VIDEO BRONCHOSCOPY WITH ENDOBRONCHIAL NAVIGATION N/A 03/13/2019   Procedure: VIDEO BRONCHOSCOPY WITH ENDOBRONCHIAL NAVIGATION;  Surgeon: Ottie Glazier, MD;  Location: [facility] ORS;  Service: Thoracic;  Laterality: N/A;  . VIDEO BRONCHOSCOPY WITH ENDOBRONCHIAL ULTRASOUND N/A 02/13/2019   Procedure: VIDEO BRONCHOSCOPY WITH ENDOBRONCHIAL ULTRASOUND;  Surgeon: Ottie Glazier, MD;  Location: [facility] ORS;  Service: Thoracic;  Laterality: N/A;  . VIDEO BRONCHOSCOPY WITH ENDOBRONCHIAL ULTRASOUND N/A 03/13/2019   Procedure: VIDEO BRONCHOSCOPY WITH ENDOBRONCHIAL ULTRASOUND;  Surgeon: Ottie Glazier, MD;  Location: [facility] ORS;  Service: Thoracic;  Laterality: N/A;    Prior to Admission medications   Medication Sig Start Date End Date Taking? Authorizing Provider  albuterol (PROVENTIL HFA;VENTOLIN HFA) 108 (90 Base) MCG/ACT inhaler Inhale 2 puffs into the lungs every 4 (four) hours as needed for wheezing or shortness of breath.     [provider]  aspirin EC 81 MG EC tablet Take 1 tablet (81 mg total) by  mouth daily. 05/04/19   Nolberto Hanlon, MD  DALIRESP 250 MCG TABS Take 250 mcg by mouth at bedtime. 10/17/18   Stark Jock Jude, MD  ethambutol (MYAMBUTOL) 400 MG tablet Take 2 tablets (800 mg total) by mouth daily. 04/11/19   Wyvonnia Dusky, MD  finasteride (PROSCAR) 5 MG tablet Take 1 tablet (5 mg total) by mouth daily. 03/02/19   Festus Aloe, MD  FLUoxetine (PROZAC) 20 MG capsule Take 20 mg by mouth daily.     [provider]  Fluticasone-Umeclidin-Vilant (TRELEGY ELLIPTA) 100-62.5-25 MCG/INH AEPB Inhale 2 puffs into the lungs at bedtime.  08/18/18   [provider]  folic acid (FOLVITE) 1 MG tablet Take 1 tablet (1 mg total) by mouth  daily. 05/04/19   Nolberto Hanlon, MD  furosemide (LASIX) 40 MG tablet Take 1 tablet (40 mg total) by mouth daily. 05/03/19   Nolberto Hanlon, MD  gabapentin (NEURONTIN) 300 MG capsule Take 900 mg by mouth at bedtime.     [provider]  guaiFENesin-dextromethorphan (ROBITUSSIN DM) 100-10 MG/5ML syrup Take 10 mLs by mouth every 6 (six) hours as needed for up to 10 days for cough. 05/14/19 05/24/19  Val Riles, MD  lidocaine (LIDODERM) 5 % Place 1 patch onto the skin every 12 (twelve) hours. Remove & Discard patch within 12 hours or as directed by MD 05/23/19 05/22/20  Blake Divine, MD  metoprolol succinate (TOPROL-XL) 25 MG 24 hr tablet Take 0.5 tablets (12.5 mg total) by mouth daily. 05/03/19 06/02/19  Nolberto Hanlon, MD  mirtazapine (REMERON) 7.5 MG tablet Take 1 tablet (7.5 mg total) by mouth at bedtime. 04/23/19 05/23/19  Wyvonnia Dusky, MD  montelukast (SINGULAIR) 10 MG tablet Take 10 mg by mouth at bedtime.    [provider]  Multiple Vitamin (MULTIVITAMIN WITH MINERALS) TABS tablet Take 1 tablet by mouth daily. 05/04/19   Nolberto Hanlon, MD  nicotine (NICODERM CQ - DOSED IN MG/24 HOURS) 21 mg/24hr patch Place 1 patch (21 mg total) onto the skin daily. 05/03/19   Nolberto Hanlon, MD  omeprazole (PRILOSEC) 20 MG capsule Take 20 mg by mouth daily.    [provider]  Oxycodone HCl 10 MG TABS Take 10 mg by mouth 4 (four) times daily as needed for pain. 09/29/18   [provider]  rifampin (RIFADIN) 300 MG capsule Take 2 capsules (600 mg total) by mouth daily. 05/14/19 06/13/19  Val Riles, MD  rosuvastatin (CRESTOR) 5 MG tablet Take 5 mg by mouth at bedtime.    [provider]  tamsulosin (FLOMAX) 0.4 MG CAPS capsule Take 1 capsule (0.4 mg total) by mouth daily after supper. 12/22/18   Festus Aloe, MD  thiamine 100 MG tablet Take 1 tablet (100 mg total) by mouth daily. 05/04/19   Nolberto Hanlon, MD    Allergies Penicillins and Lidocaine  Family History  Problem  Relation Age of Onset  . Cancer Mother   . COPD Father 19       black lung    Social History Social History   Tobacco Use  . Smoking status: Light Tobacco Smoker    Packs/day: 0.50    Years: 30.00    Pack years: 15.00    Types: Cigarettes    Start date: 02/26/1973    Last attempt to quit: 09/21/2018    Years since quitting: 0.6  . Smokeless tobacco: Former Systems developer    Quit date: 02/26/1998  . Tobacco comment: smokes 2-3 cig a week 02/12/19; per pt no longer  smokes  Substance Use Topics  . Alcohol use: Yes    Alcohol/week: 20.0 standard drinks    Types: 20 Cans of beer per week    Comment: week   . Drug use: Yes    Comment: prescribed hydrocodone    Review of Systems  Constitutional: No fever/chills Eyes: No visual changes. ENT: No sore throat. Cardiovascular: Positive for chest pain. Respiratory: Positive for cough and shortness of breath. Gastrointestinal: No abdominal pain.  No nausea, no vomiting.  No diarrhea.  No constipation. Genitourinary: Negative for dysuria. Musculoskeletal: Negative for back pain. Skin: Negative for rash. Neurological: Negative for headaches, focal weakness or numbness.  ____________________________________________   PHYSICAL EXAM:  VITAL SIGNS: ED Triage Vitals  Enc Vitals Group     BP      Pulse      Resp      Temp      Temp src      SpO2      Weight      Height      Head Circumference      Peak Flow      Pain Score      Pain Loc      Pain Edu?      Excl. in [place]?     Constitutional: Alert and oriented. Eyes: Conjunctivae are normal. Head: Atraumatic. Nose: No congestion/rhinnorhea. Mouth/Throat: Mucous membranes are moist. Neck: Normal ROM Cardiovascular: Normal rate, regular rhythm. Grossly normal heart sounds. Respiratory: Normal respiratory effort.  No retractions. Lungs with faint expiratory wheezing. Gastrointestinal: Soft and nontender. No distention. Genitourinary: deferred Musculoskeletal: No lower extremity  tenderness nor edema. Neurologic:  Normal speech and language. No gross focal neurologic deficits are appreciated. Skin:  Skin is warm, dry and intact. No rash noted. Psychiatric: Mood and affect are normal. Speech and behavior are normal.  ____________________________________________   LABS (all labs ordered are listed, but only abnormal results are displayed)  Labs Reviewed  CBC WITH DIFFERENTIAL/PLATELET - Abnormal; Notable for the following components:      Result Value   Hemoglobin 12.6 (*)    HCT 38.5 (*)    RDW 15.8 (*)    All other components within normal limits  BASIC METABOLIC PANEL - Abnormal; Notable for the following components:   Sodium 132 (*)    Chloride 96 (*)    Creatinine, Ser 1.51 (*)    Calcium 8.1 (*)    GFR calc non Af Amer 49 (*)    GFR calc Af Amer 57 (*)    All other components within normal limits  TROPONIN I (HIGH SENSITIVITY) - Abnormal; Notable for the following components:   Troponin I (High Sensitivity) 55 (*)    All other components within normal limits  TROPONIN I (HIGH SENSITIVITY) - Abnormal; Notable for the following components:   Troponin I (High Sensitivity) 62 (*)    All other components within normal limits   ____________________________________________  EKG  ED ECG REPORT I, Blake Divine, the attending physician, personally viewed and interpreted this ECG.   Date: 05/23/2019  EKG Time: 7:57  Rate: 83  Rhythm: normal sinus rhythm  Axis: LAD  Intervals:left anterior fascicular block  ST&T Change: T wave inversions laterally, similar to prior   PROCEDURES  Procedure(s) performed (including Critical Care):  Procedures   ____________________________________________   INITIAL IMPRESSION / ASSESSMENT AND PLAN / ED COURSE       64 year old male with history of MAC pneumonia, AAA, CAD, CHF, and chronic respiratory failure on  3 L nasal cannula presents to the ED for increasing left-sided pleuritic chest pain since last  night as well as increasing cough and difficulty breathing.  He has very minimal wheezing on exam and declines breathing treatment here in the ED, is not in any respiratory distress and maintaining O2 sats on his usual 3 L.  Symptoms sound very atypical for ACS but given his history we will screen EKG and 2 sets of troponin.  He has had negative CTA for PE in the past but given worsening pleuritic pain, we will repeat today to assess for PE or recurrent HCAP.  He overall clinically appears well and near his baseline, if work-up is unremarkable I suspect this to be a recurrence of his chronic pain.  2 sets of troponin are mildly elevated but stable, elevation likely secondary to patient's chronic kidney disease.  He did have a very slight AKI and was hydrated with a small IV fluid bolus.  Patient reports pain is improved and CTA is negative for PE.  CT does show chronic changes in lung similar to prior, no evidence of acute pneumonia.  He remained stable from a respiratory standpoint on his typical 3 L nasal cannula.  He had complained of right shoulder pain in the area of his trapezius, states he has dealt with this off and on for a long time.  Lidoderm patch was placed and he was prescribed Lidoderm to use at home.  He is appropriate for discharge home, was counseled to return to the ED for new or worsening symptoms.  Patient agrees with plan.  Patient's brother came to pick him up from the ED and became upset that patient was short of breath while getting into the car.  It was found that patient's brother did not bring the patient's home oxygen with him, and when patient's 3 L nasal cannula was placed back on he was no longer short of breath and back to his baseline respiratory status.  He remained stable and appropriate for discharge home.      ____________________________________________   FINAL CLINICAL IMPRESSION(S) / ED DIAGNOSES  Final diagnoses:  Chest pain, unspecified type  Abdominal  aortic aneurysm (AAA) without rupture (HCC)  Chronic respiratory failure with hypoxia (HCC)  Chronic right shoulder pain     ED Discharge Orders         Ordered    lidocaine (LIDODERM) 5 %  Every 12 hours     05/23/19 1214           Note:  This document was prepared using Dragon voice recognition software and may include unintentional dictation errors.   Blake Divine, MD 05/23/19 1538

## 2019-05-23 NOTE — ED Triage Notes (Signed)
Pt to ED by [facility] with c/o of chest pain r/t to SOB. Pt has end stage COPD. Pt seen yesterday for the same but states no relief. Pt on 3L O2 chronically and currently O2 Sat at 100%.

## 2019-05-23 NOTE — ED Notes (Signed)
Pt verbalized understanding of discharge instructions. NAD at this time. 

## 2019-05-25 MED ORDER — GENERIC EXTERNAL MEDICATION
5.00 | Status: DC
Start: 2019-05-26 — End: 2019-05-25

## 2019-05-25 MED ORDER — FOLIC ACID 1 MG PO TABS
1.00 | ORAL_TABLET | ORAL | Status: DC
Start: 2019-06-04 — End: 2019-05-25

## 2019-05-25 MED ORDER — DEXTROSE-SODIUM CHLORIDE 5-0.9 % IV SOLN
50.00 | INTRAVENOUS | Status: DC
Start: ? — End: 2019-05-25

## 2019-05-25 MED ORDER — UMECLIDINIUM-VILANTEROL 62.5-25 MCG/INH IN AEPB
1.00 | INHALATION_SPRAY | RESPIRATORY_TRACT | Status: DC
Start: 2019-06-04 — End: 2019-05-25

## 2019-05-25 MED ORDER — DEXTROSE 50 % IV SOLN
12.50 | INTRAVENOUS | Status: DC
Start: ? — End: 2019-05-25

## 2019-05-25 MED ORDER — FLUOXETINE HCL 20 MG PO CAPS
20.00 | ORAL_CAPSULE | ORAL | Status: DC
Start: 2019-06-04 — End: 2019-05-25

## 2019-05-25 MED ORDER — IPRATROPIUM-ALBUTEROL 0.5-2.5 (3) MG/3ML IN SOLN
3.00 | RESPIRATORY_TRACT | Status: DC
Start: ? — End: 2019-05-25

## 2019-05-25 MED ORDER — ONDANSETRON 4 MG PO TBDP
4.00 | ORAL_TABLET | ORAL | Status: DC
Start: ? — End: 2019-05-25

## 2019-05-25 MED ORDER — DICLOFENAC SODIUM 1 % EX GEL
2.00 | CUTANEOUS | Status: DC
Start: 2019-06-03 — End: 2019-05-25

## 2019-05-25 MED ORDER — MONTELUKAST SODIUM 10 MG PO TABS
10.00 | ORAL_TABLET | ORAL | Status: DC
Start: 2019-06-03 — End: 2019-05-25

## 2019-05-25 MED ORDER — OXYCODONE HCL 5 MG PO TABS
2.50 | ORAL_TABLET | ORAL | Status: DC
Start: ? — End: 2019-05-25

## 2019-05-25 MED ORDER — ASPIRIN 81 MG PO CHEW
81.00 | CHEWABLE_TABLET | ORAL | Status: DC
Start: 2019-06-04 — End: 2019-05-25

## 2019-05-25 MED ORDER — GABAPENTIN 300 MG PO CAPS
300.00 | ORAL_CAPSULE | ORAL | Status: DC
Start: 2019-06-03 — End: 2019-05-25

## 2019-05-25 MED ORDER — HEPARIN SODIUM (PORCINE) 5000 UNIT/ML IJ SOLN
5000.00 | INTRAMUSCULAR | Status: DC
Start: 2019-05-28 — End: 2019-05-25

## 2019-05-25 MED ORDER — THIAMINE HCL 100 MG PO TABS
100.00 | ORAL_TABLET | ORAL | Status: DC
Start: 2019-06-04 — End: 2019-05-25

## 2019-05-25 MED ORDER — FINASTERIDE 5 MG PO TABS
5.00 | ORAL_TABLET | ORAL | Status: DC
Start: 2019-06-04 — End: 2019-05-25

## 2019-05-25 MED ORDER — PANTOPRAZOLE SODIUM 20 MG PO TBEC
20.00 | DELAYED_RELEASE_TABLET | ORAL | Status: DC
Start: 2019-06-04 — End: 2019-05-25

## 2019-05-25 NOTE — Progress Notes (Signed)
[place] received referral from on-call chaplain who got CODE STEMI page; [facility] has visited w/pt. numerous times in past month and followed up in ED.  Pt.'s code STEMI cancelled shortly after being sent; pt. in bed sitting up; reported he was experiencing chest pain and came to ED.  Pt. says he is still experiencing trouble breathing per pneumonia.  Pt. expressed frustration to have to be in hospital on such a warm day: pt. enjoys the outdoors very much.  Pt. appears to be coping effectively; [place] is available as needed and will follow up if pt. is admitted to floor.    05/22/19 1230  Clinical Encounter Type  Visited With Patient  Visit Type Follow-up;Psychological support;Social support  Referral From [place] [facility]otional

## 2019-05-28 ENCOUNTER — Other Ambulatory Visit: Payer: Self-pay

## 2019-05-28 ENCOUNTER — Other Ambulatory Visit: Payer: Medicare Other | Admitting: Primary Care

## 2019-05-28 MED ORDER — BIOTENE DRY MOUTH MOISTURIZING MT SOLN
1.00 | OROMUCOSAL | Status: DC
Start: ? — End: 2019-05-28

## 2019-05-28 MED ORDER — FUROSEMIDE 10 MG/ML IJ SOLN
40.00 | INTRAMUSCULAR | Status: DC
Start: 2019-05-28 — End: 2019-05-28

## 2019-05-28 MED ORDER — METOPROLOL SUCCINATE ER 25 MG PO TB24
12.50 | ORAL_TABLET | ORAL | Status: DC
Start: 2019-06-04 — End: 2019-05-28

## 2019-05-28 MED ORDER — LOSARTAN POTASSIUM 25 MG PO TABS
25.00 | ORAL_TABLET | ORAL | Status: DC
Start: 2019-05-29 — End: 2019-05-28

## 2019-05-28 MED ORDER — MENTHOL 9.1 MG MT LOZG
1.00 | LOZENGE | OROMUCOSAL | Status: DC
Start: ? — End: 2019-05-28

## 2019-05-28 NOTE — Progress Notes (Signed)
Travelled to home, patient is in [place]. Wife states he is in liver failure. Will f/u with hospital team.

## 2019-06-01 ENCOUNTER — Telehealth: Payer: Self-pay | Admitting: Primary Care

## 2019-06-01 MED ORDER — ENOXAPARIN SODIUM 30 MG/0.3ML ~~LOC~~ SOLN
30.00 | SUBCUTANEOUS | Status: DC
Start: 2019-06-04 — End: 2019-06-01

## 2019-06-01 MED ORDER — EMPAGLIFLOZIN 10 MG PO TABS
10.00 | ORAL_TABLET | ORAL | Status: DC
Start: 2019-06-02 — End: 2019-06-01

## 2019-06-01 MED ORDER — FUROSEMIDE 80 MG PO TABS
80.00 | ORAL_TABLET | ORAL | Status: DC
Start: 2019-06-02 — End: 2019-06-01

## 2019-06-01 MED ORDER — SPIRONOLACTONE 25 MG PO TABS
12.50 | ORAL_TABLET | ORAL | Status: DC
Start: 2019-06-04 — End: 2019-06-01

## 2019-06-01 MED ORDER — SACUBITRIL-VALSARTAN 24-26 MG PO TABS
ORAL_TABLET | ORAL | Status: DC
Start: 2019-06-03 — End: 2019-06-01

## 2019-06-01 MED ORDER — TAMSULOSIN HCL 0.4 MG PO CAPS
0.40 | ORAL_CAPSULE | ORAL | Status: DC
Start: 2019-06-03 — End: 2019-06-01

## 2019-06-01 MED ORDER — GUAIFENESIN 100 MG/5ML PO SYRP
200.00 | ORAL_SOLUTION | ORAL | Status: DC
Start: ? — End: 2019-06-01

## 2019-06-01 NOTE — Telephone Encounter (Signed)
T/c to wife, patient still in hospital. Liaison team is overseeing progress at [facility]. Wife is not sure of course of treatment but we will continue to follow. Wife states they will not take the covid vaccinations.

## 2019-06-03 MED ORDER — ATORVASTATIN CALCIUM 40 MG PO TABS
40.00 | ORAL_TABLET | ORAL | Status: DC
Start: 2019-06-04 — End: 2019-06-03

## 2019-06-05 LAB — ACID FAST CULTURE WITH REFLEXED SENSITIVITIES (MYCOBACTERIA): Acid Fast Culture: NEGATIVE

## 2019-06-08 MED ORDER — FLUOXETINE HCL 20 MG PO CAPS
20.00 | ORAL_CAPSULE | ORAL | Status: DC
Start: 2019-06-11 — End: 2019-06-08

## 2019-06-08 MED ORDER — MONTELUKAST SODIUM 10 MG PO TABS
10.00 | ORAL_TABLET | ORAL | Status: DC
Start: 2019-06-10 — End: 2019-06-08

## 2019-06-08 MED ORDER — PANTOPRAZOLE SODIUM 20 MG PO TBEC
20.00 | DELAYED_RELEASE_TABLET | ORAL | Status: DC
Start: 2019-06-11 — End: 2019-06-08

## 2019-06-08 MED ORDER — CYANOCOBALAMIN 1000 MCG PO TABS
500.00 | ORAL_TABLET | ORAL | Status: DC
Start: 2019-06-11 — End: 2019-06-08

## 2019-06-08 MED ORDER — FUROSEMIDE 40 MG PO TABS
40.00 | ORAL_TABLET | ORAL | Status: DC
Start: 2019-06-09 — End: 2019-06-08

## 2019-06-08 MED ORDER — FINASTERIDE 5 MG PO TABS
5.00 | ORAL_TABLET | ORAL | Status: DC
Start: 2019-06-11 — End: 2019-06-08

## 2019-06-08 MED ORDER — SPIRONOLACTONE 25 MG PO TABS
12.50 | ORAL_TABLET | ORAL | Status: DC
Start: 2019-06-11 — End: 2019-06-08

## 2019-06-08 MED ORDER — ACETAMINOPHEN 500 MG PO TABS
1000.00 | ORAL_TABLET | ORAL | Status: DC
Start: ? — End: 2019-06-08

## 2019-06-08 MED ORDER — OXYCODONE HCL 5 MG PO TABS
5.00 | ORAL_TABLET | ORAL | Status: DC
Start: ? — End: 2019-06-08

## 2019-06-08 MED ORDER — GABAPENTIN 300 MG PO CAPS
300.00 | ORAL_CAPSULE | ORAL | Status: DC
Start: 2019-06-10 — End: 2019-06-08

## 2019-06-08 MED ORDER — DICLOFENAC SODIUM 1 % EX GEL
2.00 | CUTANEOUS | Status: DC
Start: 2019-06-10 — End: 2019-06-08

## 2019-06-08 MED ORDER — NICOTINE 21 MG/24HR TD PT24
1.00 | MEDICATED_PATCH | TRANSDERMAL | Status: DC
Start: 2019-06-11 — End: 2019-06-08

## 2019-06-08 MED ORDER — IPRATROPIUM-ALBUTEROL 0.5-2.5 (3) MG/3ML IN SOLN
3.00 | RESPIRATORY_TRACT | Status: DC
Start: ? — End: 2019-06-08

## 2019-06-08 MED ORDER — UMECLIDINIUM-VILANTEROL 62.5-25 MCG/INH IN AEPB
1.00 | INHALATION_SPRAY | RESPIRATORY_TRACT | Status: DC
Start: 2019-06-09 — End: 2019-06-08

## 2019-06-08 MED ORDER — ASPIRIN 81 MG PO CHEW
81.00 | CHEWABLE_TABLET | ORAL | Status: DC
Start: 2019-06-11 — End: 2019-06-08

## 2019-06-08 MED ORDER — ATORVASTATIN CALCIUM 40 MG PO TABS
40.00 | ORAL_TABLET | ORAL | Status: DC
Start: 2019-06-11 — End: 2019-06-08

## 2019-06-10 MED ORDER — ARFORMOTEROL TARTRATE 15 MCG/2ML IN NEBU
15.00 | INHALATION_SOLUTION | RESPIRATORY_TRACT | Status: DC
Start: 2019-06-10 — End: 2019-06-10

## 2019-06-10 MED ORDER — METOPROLOL SUCCINATE ER 25 MG PO TB24
12.50 | ORAL_TABLET | ORAL | Status: DC
Start: 2019-06-11 — End: 2019-06-10

## 2019-06-10 MED ORDER — SODIUM CHLORIDE 3 % IN NEBU
4.00 | INHALATION_SOLUTION | RESPIRATORY_TRACT | Status: DC
Start: 2019-06-10 — End: 2019-06-10

## 2019-06-10 MED ORDER — FUROSEMIDE 20 MG PO TABS
20.00 | ORAL_TABLET | ORAL | Status: DC
Start: 2019-06-11 — End: 2019-06-10

## 2019-06-10 MED ORDER — BUDESONIDE 0.5 MG/2ML IN SUSP
0.50 | RESPIRATORY_TRACT | Status: DC
Start: 2019-06-10 — End: 2019-06-10

## 2019-06-10 MED ORDER — IPRATROPIUM-ALBUTEROL 0.5-2.5 (3) MG/3ML IN SOLN
3.00 | RESPIRATORY_TRACT | Status: DC
Start: ? — End: 2019-06-10

## 2019-06-10 MED ORDER — SACUBITRIL-VALSARTAN 24-26 MG PO TABS
ORAL_TABLET | ORAL | Status: DC
Start: 2019-06-10 — End: 2019-06-10

## 2019-06-11 ENCOUNTER — Telehealth: Payer: Self-pay | Admitting: Infectious Diseases

## 2019-06-11 NOTE — Telephone Encounter (Signed)
Pt is at [facility]- they will have to contact the pulmonologist there as he is still in the hopsital regarding that. Once he is discharged he will come to see me. thx

## 2019-06-11 NOTE — Telephone Encounter (Signed)
Joseph Peters called asking about PICC information  Home health that was assisting him before can no longer help. Requesting alternatives for continuing care..  For Infusions   Call back [phone]  Requesting ASAP

## 2019-06-15 ENCOUNTER — Telehealth: Payer: Self-pay

## 2019-06-15 NOTE — Telephone Encounter (Signed)
Spoke to wife advised her to call [facility] and Cardio to set up care and if needing referral contact PCP. Also set up [place] for [facility] 06/18/2019 for our office in person.

## 2019-06-15 NOTE — Telephone Encounter (Signed)
Thanks

## 2019-06-15 NOTE — Telephone Encounter (Signed)
VM from Joseph Peters at [facility] stating he is D/C home and they have started [facility]. He will get labs twice a day twice a week and once a day for a week. Also spoke to [place] from [place] and advised we will follow patient. Labs drawn today awaiting results. Also updated new fax number.

## 2019-06-17 ENCOUNTER — Telehealth: Payer: Self-pay

## 2019-06-17 NOTE — Telephone Encounter (Signed)
Yes, that's fine. They should follow their protocol for the flush

## 2019-06-17 NOTE — Telephone Encounter (Signed)
[facility] called asking if he gets hep loc of 2 ml or 18ml order says 46ml but they rcvd 5 ml hep loc

## 2019-06-17 NOTE — Telephone Encounter (Signed)
LM advising to follow protocol and order for med and ask pharmacist about the recvd product or any other changes as we do not give Hep loc advise normally.

## 2019-06-17 NOTE — Telephone Encounter (Signed)
The confusion is they received 66ml but order is 91ml. Which do they do 85ml they received or 55ml in orders?

## 2019-06-18 ENCOUNTER — Inpatient Hospital Stay: Payer: Medicare Other | Admitting: Infectious Diseases

## 2019-06-22 ENCOUNTER — Telehealth: Payer: Self-pay

## 2019-06-22 NOTE — Telephone Encounter (Signed)
Refill on flomax denied patient has not been seen since 02/2018, patient needs appointment.  Left message for patient

## 2019-06-23 ENCOUNTER — Encounter: Payer: Self-pay | Admitting: Infectious Diseases

## 2019-06-23 ENCOUNTER — Ambulatory Visit: Payer: Medicare Other | Attending: Infectious Diseases | Admitting: Infectious Diseases

## 2019-06-23 ENCOUNTER — Other Ambulatory Visit: Payer: Self-pay

## 2019-06-23 ENCOUNTER — Telehealth: Payer: Self-pay

## 2019-06-23 VITALS — BP 135/83 | HR 67 | Temp 97.9°F | Resp 16 | Ht 65.0 in | Wt 108.0 lb

## 2019-06-23 DIAGNOSIS — J449 Chronic obstructive pulmonary disease, unspecified: Secondary | ICD-10-CM | POA: Insufficient documentation

## 2019-06-23 DIAGNOSIS — E785 Hyperlipidemia, unspecified: Secondary | ICD-10-CM | POA: Insufficient documentation

## 2019-06-23 DIAGNOSIS — I110 Hypertensive heart disease with heart failure: Secondary | ICD-10-CM | POA: Insufficient documentation

## 2019-06-23 DIAGNOSIS — K219 Gastro-esophageal reflux disease without esophagitis: Secondary | ICD-10-CM | POA: Diagnosis not present

## 2019-06-23 DIAGNOSIS — Z87891 Personal history of nicotine dependence: Secondary | ICD-10-CM | POA: Diagnosis not present

## 2019-06-23 DIAGNOSIS — I252 Old myocardial infarction: Secondary | ICD-10-CM | POA: Insufficient documentation

## 2019-06-23 DIAGNOSIS — A310 Pulmonary mycobacterial infection: Secondary | ICD-10-CM | POA: Insufficient documentation

## 2019-06-23 DIAGNOSIS — I509 Heart failure, unspecified: Secondary | ICD-10-CM | POA: Insufficient documentation

## 2019-06-23 DIAGNOSIS — F329 Major depressive disorder, single episode, unspecified: Secondary | ICD-10-CM | POA: Diagnosis not present

## 2019-06-23 DIAGNOSIS — Z96641 Presence of right artificial hip joint: Secondary | ICD-10-CM | POA: Diagnosis not present

## 2019-06-23 DIAGNOSIS — Z880 Allergy status to penicillin: Secondary | ICD-10-CM | POA: Insufficient documentation

## 2019-06-23 DIAGNOSIS — F419 Anxiety disorder, unspecified: Secondary | ICD-10-CM | POA: Insufficient documentation

## 2019-06-23 DIAGNOSIS — N400 Enlarged prostate without lower urinary tract symptoms: Secondary | ICD-10-CM | POA: Insufficient documentation

## 2019-06-23 DIAGNOSIS — Z7982 Long term (current) use of aspirin: Secondary | ICD-10-CM | POA: Diagnosis not present

## 2019-06-23 DIAGNOSIS — Z825 Family history of asthma and other chronic lower respiratory diseases: Secondary | ICD-10-CM | POA: Diagnosis not present

## 2019-06-23 DIAGNOSIS — Z9114 Patient's other noncompliance with medication regimen: Secondary | ICD-10-CM | POA: Insufficient documentation

## 2019-06-23 DIAGNOSIS — Z884 Allergy status to anesthetic agent status: Secondary | ICD-10-CM | POA: Insufficient documentation

## 2019-06-23 DIAGNOSIS — I714 Abdominal aortic aneurysm, without rupture: Secondary | ICD-10-CM | POA: Insufficient documentation

## 2019-06-23 NOTE — Patient Instructions (Signed)
You are here for follow of MAC infection of lung - you were recently hospitalized in [facility] When you were discharged you were sent on Amikacin Intravenous injection taken on Monday- Wednesday and Friday You are also on three other antibiotics which you take by mout  Azithromycin 571m one tablet  a day==1  Rifabutin  -3024mtablet== 15050m2===2 Ethambutol -700m25m 100mg59mlet X 7--- 7  ______________________________________________  Follow up 4 weeks with me  Follow with your PCP and Pulmonary doctor

## 2019-06-23 NOTE — Telephone Encounter (Signed)
Spoke to [place] at [place]. She is emailing the [facility] orders today that read the following:  LABS Twice a week BMP and CBC W/DIFF Added LFP weekly per Dr Delaine Lame  PICC line Infusion= Amikacin 1150 mg (MWF) 3 Times a week.  [facility] [phone]

## 2019-06-23 NOTE — Progress Notes (Signed)
NAME: Joseph Peters  DOB: October 21, 1955  MRN: [id number]  Date/Time: 06/23/2019 11:49 AM   Subjective:  Follow-up visit after recent hospitalization at [facility] ? Joseph Peters is a 64 y.o. male with a history of COPD, MAC infection of the lungs, hypertension, hyperlipidemia, GERD, CAD, CHF with a EF of 35 to 40%, alcohol abuse, tobacco abuse, AAA, BPH is here as follow-up. Patient was diagnosed with acute bacterial avium-intracellulare infection of his lungs in in November 20 20 x 3 sputum's.  He also had CT chest which revealed fibrocavitary lesion on the right side of his lung.  Patient had seen me and started him on triple therapy with ethambutol, rifampin and azithromycin.  Patient was not able to tolerate this medication because of GI symptoms and had been stopping the medications intermittently.  Then because of worsening shortness of breath IV amikacin was also added but he could not take that because of increasing creatinine. Patient had been in the hospital multiple times in the last few months because of shortness of breath.  He recently got admitted to [facility] between 06/06/2019 until 06/12/2019 to the pulmonary service.  The pulmonologist that spoke with me.  He was restarted on MAC therapy with IV amikacin, p.o. ethambutol 700 mg, p.o. azithromycin 500 mg, rifabutin 300 mg.  A PICC line was placed and he was discharged home. He is also on home oxygen. Patient is here as follow-up. Patient is getting the IV amikacin Monday Wednesday Friday. Patient has about 10 antibiotic pills to take but it looks like patient has not been taking them regularly.  His wife [name] was at his side and she commented  that when she gave him the pills he would say that he has had too many pills and he has to only take the IV antibiotic.  Past Medical History:  Diagnosis Date  . AAA (abdominal aortic aneurysm) without rupture ([place])   . Abdominal aneurysm ([place]) 2015   being followed but not large enough to  surgically treat  . Alcoholism ([place])   . Anxiety   . Arthritis   . Asthma   . Benign hypertension 07/16/2014  . Chest pain on exertion 07/16/2014  . Chronic pain syndrome   . Coccydynia 02/08/2012  . COPD (chronic obstructive pulmonary disease) ([place])    patient smokes  . Coronary artery disease   . 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 Depression   . 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 Dyspnea   . Frequent falls   . GERD (gastroesophageal reflux disease)   . Hyperlipidemia   . IBS (irritable bowel syndrome)   . Myocardial infarction ([place]) 2010  . Neuropathy   . Pneumonia   . Prostatitis   . Restless leg syndrome   . Sciatica   . Spinal stenosis of lumbar region   . Stroke ([place]) 2015   no residual symptoms  . Tremor, essential     Past Surgical History:  Procedure Laterality Date  . CARDIAC CATHETERIZATION  2012  . COLONOSCOPY WITH PROPOFOL N/A 11/14/2016   Procedure: COLONOSCOPY WITH PROPOFOL;  Surgeon: Manya Silvas, MD;  Location: [facility] ENDOSCOPY;  Service: Endoscopy;  Laterality: N/A;  . ESOPHAGOGASTRODUODENOSCOPY (EGD) WITH PROPOFOL N/A 11/14/2016   Procedure: ESOPHAGOGASTRODUODENOSCOPY (EGD) WITH PROPOFOL;  Surgeon: Manya Silvas, MD;  Location: [facility] ENDOSCOPY;  Service: Endoscopy;  Laterality: N/A;  . JOINT REPLACEMENT Right    hip  . KNEE SURGERY     right  .  SHOULDER ARTHROSCOPY WITH OPEN ROTATOR CUFF REPAIR Right 08/20/2017   Procedure: SHOULDER ARTHROSCOPY WITH OPEN ROTATOR CUFF REPAIR;  Surgeon: Corky Mull, MD;  Location: [facility] ORS;  Service: Orthopedics;  Laterality: Right;  . SHOULDER ARTHROSCOPY WITH SUBACROMIAL DECOMPRESSION, ROTATOR CUFF REPAIR AND BICEP TENDON REPAIR Left 05/10/2016   Procedure: SHOULDER ARTHROSCOPY WITH DEBTRIDEMENT, DECOMPRESSION, REPAIR OF MASSIVE ROTATOR CUFF TEAR AND BICEP TENODESIS;  Surgeon: Corky Mull, MD;  Location: [facility] ORS;  Service: Orthopedics;  Laterality: Left;  Massive cuff tear   . TOTAL HIP ARTHROPLASTY Right 07/01/2018   Procedure: TOTAL HIP ARTHROPLASTY - RIGHT;  Surgeon: Corky Mull, MD;  Location: [facility] ORS;  Service: Orthopedics;  Laterality: Right;  Marland Kitchen VIDEO BRONCHOSCOPY WITH ENDOBRONCHIAL NAVIGATION N/A 02/13/2019   Procedure: VIDEO BRONCHOSCOPY WITH ENDOBRONCHIAL NAVIGATION;  Surgeon: Ottie Glazier, MD;  Location: [facility] ORS;  Service: Thoracic;  Laterality: N/A;  . VIDEO BRONCHOSCOPY WITH ENDOBRONCHIAL NAVIGATION N/A 03/13/2019   Procedure: VIDEO BRONCHOSCOPY WITH ENDOBRONCHIAL NAVIGATION;  Surgeon: Ottie Glazier, MD;  Location: [facility] ORS;  Service: Thoracic;  Laterality: N/A;  . VIDEO BRONCHOSCOPY WITH ENDOBRONCHIAL ULTRASOUND N/A 02/13/2019   Procedure: VIDEO BRONCHOSCOPY WITH ENDOBRONCHIAL ULTRASOUND;  Surgeon: Ottie Glazier, MD;  Location: [facility] ORS;  Service: Thoracic;  Laterality: N/A;  . VIDEO BRONCHOSCOPY WITH ENDOBRONCHIAL ULTRASOUND N/A 03/13/2019   Procedure: VIDEO BRONCHOSCOPY WITH ENDOBRONCHIAL ULTRASOUND;  Surgeon: Ottie Glazier, MD;  Location: [facility] ORS;  Service: Thoracic;  Laterality: N/A;    Social History   Socioeconomic History  . Marital status: Married    Spouse name: Not on file  . Number of children: Not on file  . Years of education: Not on file  . Highest education level: Not on file  Occupational History  . Not on file  Tobacco Use  . Smoking status: Former Smoker    Packs/day: 0.50    Years: 40.00    Pack years: 20.00    Types: Cigarettes    Start date: 02/26/1973    Quit date: 09/21/2018    Years since quitting: 0.7  . Smokeless tobacco: Former Systems developer    Quit date: 02/26/1998  . Tobacco comment: smokes 2-3 cig a week 02/12/19; per pt no longer smokes  Substance and Sexual Activity  . Alcohol use: Yes    Alcohol/week: 20.0 standard drinks    Types: 20 Cans of beer per week    Comment: week   . Drug use: Yes    Comment: prescribed hydrocodone  . Sexual activity: Not on file  Other Topics Concern  . Not on file  Social  History Narrative   Patient is a Nature conservation officer.;  He lives with his wife at home.  Longstanding history of smoking; quit recently.  Denies alcohol abuse.   Social Determinants of Health   Financial Resource Strain:   . Difficulty of Paying Living Expenses:   Food Insecurity:   . Worried About Charity fundraiser in the Last Year:   . Arboriculturist in the Last Year:   Transportation Needs:   . Film/video editor (Medical):   Marland Kitchen Lack of Transportation (Non-Medical):   Physical Activity:   . Days of Exercise per Week:   . Minutes of Exercise per Session:   Stress:   . Feeling of Stress :   Social Connections:   . Frequency of Communication with Friends and Family:   . Frequency of Social Gatherings with Friends and Family:   . Attends Religious Services:   . Active Member  of Clubs or Organizations:   . Attends Archivist Meetings:   Marland Kitchen Marital Status:   Intimate Partner Violence:   . Fear of Current or Ex-Partner:   . Emotionally Abused:   Marland Kitchen Physically Abused:   . Sexually Abused:     Family History  Problem Relation Age of Onset  . Cancer Mother   . COPD Father 75       black lung   Allergies  Allergen Reactions  . Penicillins Swelling, Rash and Other (See Comments)    Swelling in throat  Did it involve swelling of the face/tongue/throat, SOB, or low BP? yes Did it involve sudden or severe rash/hives, skin peeling, or any reaction on the inside of your mouth or nose? no Did you need to seek medical attention at a hospital or doctor's office? Yes When did it last happen?years  If all above answers are "NO", may proceed with cephalosporin use.    . Lidocaine Other (See Comments)    Unsure  At dentist's office     ? Current Outpatient Medications  Medication Sig Dispense Refill  . amikacin in dextrose solution Inject 1,500 mg into the vein.    Marland Kitchen arformoterol (BROVANA) 15 MCG/2ML NEBU Inhale into the lungs.    Marland Kitchen aspirin EC 81 MG EC tablet  Take 1 tablet (81 mg total) by mouth daily.    Marland Kitchen atorvastatin (LIPITOR) 40 MG tablet Take by mouth.    Marland Kitchen azithromycin (ZITHROMAX) 500 MG tablet Take by mouth.    Marland Kitchen DALIRESP 250 MCG TABS Take 250 mcg by mouth at bedtime. 30 tablet 0  . ethambutol (MYAMBUTOL) 400 MG tablet Take 2 tablets (800 mg total) by mouth daily. 60 tablet 0  . finasteride (PROSCAR) 5 MG tablet Take 1 tablet (5 mg total) by mouth daily. 30 tablet 0  . FLUoxetine (PROZAC) 20 MG capsule Take 20 mg by mouth daily.     . Fluticasone-Umeclidin-Vilant (TRELEGY ELLIPTA) 100-62.5-25 MCG/INH AEPB Inhale 2 puffs into the lungs at bedtime.     . folic acid (FOLVITE) 1 MG tablet Take 1 tablet (1 mg total) by mouth daily. 30 tablet 0  . furosemide (LASIX) 40 MG tablet Take 1 tablet (40 mg total) by mouth daily. 30 tablet 0  . gabapentin (NEURONTIN) 300 MG capsule Take 900 mg by mouth at bedtime.     . lidocaine (LIDODERM) 5 % Place 1 patch onto the skin every 12 (twelve) hours. Remove & Discard patch within 12 hours or as directed by MD 10 patch 0  . metoprolol succinate (TOPROL-XL) 25 MG 24 hr tablet Take 0.5 tablets (12.5 mg total) by mouth daily. 15 tablet 0  . montelukast (SINGULAIR) 10 MG tablet Take 10 mg by mouth at bedtime.    . Multiple Vitamin (MULTIVITAMIN WITH MINERALS) TABS tablet Take 1 tablet by mouth daily.    . nicotine (NICODERM CQ - DOSED IN MG/24 HOURS) 21 mg/24hr patch Place 1 patch (21 mg total) onto the skin daily. 14 patch 0  . omeprazole (PRILOSEC) 20 MG capsule Take 20 mg by mouth daily.    . Oxycodone HCl 10 MG TABS Take 10 mg by mouth 4 (four) times daily as needed for pain.    . sacubitril-valsartan (ENTRESTO) 24-26 MG Take 1 tablet by mouth 2 (two) times daily.    . tamsulosin (FLOMAX) 0.4 MG CAPS capsule Take 1 capsule (0.4 mg total) by mouth daily after supper. 90 capsule 1  . thiamine 100 MG tablet  Take 1 tablet (100 mg total) by mouth daily. 30 tablet 0  . albuterol (PROVENTIL HFA;VENTOLIN HFA) 108 (90  Base) MCG/ACT inhaler Inhale 2 puffs into the lungs every 4 (four) hours as needed for wheezing or shortness of breath.     . mirtazapine (REMERON) 7.5 MG tablet Take 1 tablet (7.5 mg total) by mouth at bedtime. 30 tablet 0   No current facility-administered medications for this visit.     Abtx:  Anti-infectives (From admission, onward)   None      REVIEW OF SYSTEMS:  Const: negative fever, negative chills, has weight loss Eyes: negative diplopia or visual changes, negative eye pain ENT: negative coryza, negative sore throat Resp: Cough and shortness of breath.  History of hemoptysis Cards: negative for chest pain, palpitations, lower extremity edema Has pain on the right upper quadrant right lower chest. GU: negative for frequency, dysuria and hematuria GI: Negative for abdominal pain, diarrhea, bleeding, constipation Skin: negative for rash and pruritus Heme: negative for easy bruising and gum/nose bleeding MS: Generalized weakness. Neurolo:negative for headaches, dizziness, vertigo, memory problems  Psych: anxiety, depression  Endocrine: negative for thyroid, diabetes Allergy/Immunology-as above.  Penicillin causes rash. Objective:  VITALS:  BP 135/83   Pulse 67   Temp 97.9 F (36.6 C)   Resp 16   Ht _0  (1.651 m)   Wt 108 lb (49 kg)   SpO2 99% Comment: liters 3 o2  BMI 17.97 kg/m  PHYSICAL EXAM:  General: Alert, cooperative, no distress, appears stated age.  Emaciated on nasal oxygen Head: Normocephalic, without obvious abnormality, atraumatic. Eyes: Conjunctivae clear, anicteric sclerae. Pupils are equal ENT Nares normal. No drainage or sinus tenderness. Lips, mucosa, and tongue normal. No Thrush Neck: Supple, symmetrical, no adenopathy, thyroid: non bilateral air entry crypts on the right side bilateral ular rate and rhythm, no murmur, rub or gallop. Abdomen: Soft, non-tender,not distended. Bowel sounds normal. No masses Extremities: atraumatic, no cyanosis. No  edema. No clubbing Skin: Did not examine well as patient is on wheelchair Lymph: Cervical, supraclavicular normal. Neurologic: Grossly non-focal Pertinent Labs Lab Results 06/22/2019 glucose was 31 but suboptimal specimen Creatinine 1.20 Amikacin trough 1.8.  Goal is less than 4   ? 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 Patient has been noncompliant with medications and recently admitted to [facility] and has been placed on a PICC line with IV amikacin 115 0 mg every Monday Wednesday and Friday.  Along with p.o. azithromycin 500 mg, ethambutol 700 mg, rifabutin 300 mg every day.. Patient patient says he only thought he has to take IV antibiotics and has not been very compliant with his oral medications.  Reiterated the importance of taking all the oral antibiotics which are 3 different ones and the total of 10 pills.  Also told his wife about the importance of taking all the oral medications.  He is diligently doing the IV amikacin through his PICC line on the right side.  ? ?Labs done yesterday shows a normal creatinine and amikacin trough of less than 1.8.  His blood glucose was 31 but I think is a lot better or an improperly collected specimen as he had the same result 3 days ago and the specimen was mentioned as not collected properly because of specimen received in contact with cells which could decrease the glucose level. ? We will add LFTs once a week to the labs ___Discussed the management with the patient and his wife in great detail.  We will follow  him in 4 weeks.  Adherence reinforced.  Weekly labs will be done by [facility] and they will fax it to me.  Continue the current antibiotics.  ________________________________________________ Discussed with patient, requesting provider Note:  This document was prepared using Dragon voice recognition software and may include unintentional dictation errors.

## 2019-07-09 ENCOUNTER — Other Ambulatory Visit: Payer: Self-pay

## 2019-07-09 ENCOUNTER — Other Ambulatory Visit: Payer: Medicare Other | Admitting: Primary Care

## 2019-07-09 DIAGNOSIS — Z515 Encounter for palliative care: Secondary | ICD-10-CM

## 2019-07-09 NOTE — Progress Notes (Signed)
[facility] Consult Note Telephone: [phone]  Fax: [phone]  PATIENT NAME: Joseph Peters [address] [place] [address] [phone] (home)  DOB: May 31, 1955 MRN: [id number]  PRIMARY CARE PROVIDER:    Barbaraann Boys, MD,  [place] [address] [phone]  REFERRING PROVIDER:   Barbaraann Boys, [place],  [place] [address] [phone]  RESPONSIBLE PARTY:   Extended Emergency Contact Information Primary Emergency Contact: Hark,Barbara R Address: [address] [place], [place] [address] [place] Phone: [phone] Mobile Phone: [phone] Relation: Spouse    I met with patient in the home. I have not seen him for 3 months due to many extended hospital stays in March and April.  ASSESSMENT AND RECOMMENDATIONS:   1. Advance Care Planning/Goals of Care: Goals include to maximize quality of life and symptom management.   Our advance care planning conversation included a discussion about:   . The value and importance of advance care planning  . Identification and preparation of a healthcare agent  . Review and update, or completion of an advance directive document  . They will review and we will discuss on next visit.   2. Symptom Management:   Oxygen: Has had problems with oxygen company, looking for new company. Needs humidity, states this was ordered but not provided. States no one did a set up of the oxygen. States he has dry mucosa. Given some options for other DME companies for oxygen supply.  Pain: Hip has been hurting. Has had a r replacement but l needs replacing. Dr Roland Rack was surgeon and he states he may get the other hip replaced when stable with his lungs.   Home management: Getting IV  abx with home visiting agency, not sure which. On oxygen and states relative comfort at rest but some DOE. States they have enough support for meals  and caregiving. Wife is also not well. Has gained back a few pounds.  3. Family /Caregiver/Community Supports: Lives with wife in trailer home. Family nearby and supports.  4. Cognitive / Functional decline:  Alert and oriented x 3, forgets some details able to do all adls, iadls.   5. Follow up Palliative Care Visit: Palliative care will continue to follow for goals of care clarification and symptom management. Return 6-8  weeks or prn.  I spent 60 minutes providing this consultation,  from 1230 to 1330. More than 50% of the time in this consultation was spent coordinating communication.   HISTORY OF PRESENT ILLNESS:  Joseph Peters is a 64 y.o. year old male with multiple medical problems including COPD, tobacco abuse, CVA, chronic pain, OA. Palliative Care was asked to follow this patient by consultation request of Dr Rebeca Alert to help address advance care planning and goals of care. This is a follow up visit.  CODE STATUS: TBD  PPS: 50% HOSPICE ELIGIBILITY/DIAGNOSIS: TBD  PAST MEDICAL HISTORY:  Past Medical History:  Diagnosis Date  . AAA (abdominal aortic aneurysm) without rupture ([place])   . Abdominal aneurysm ([place]) 2015   being followed but not large enough to surgically treat  . Alcoholism ([place])   . Anxiety   . Arthritis   . Asthma   . Benign hypertension 07/16/2014  . Chest pain on exertion 07/16/2014  . Chronic pain syndrome   . Coccydynia 02/08/2012  . COPD (chronic obstructive pulmonary disease) ([place])  patient smokes  . Coronary artery disease   . 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 Depression   . 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 Dyspnea   . Frequent falls   . GERD (gastroesophageal reflux disease)   . Hyperlipidemia   . IBS (irritable bowel syndrome)   . Myocardial infarction ([place]) 2010  . Neuropathy   . Pneumonia   . Prostatitis   . Restless leg syndrome   . Sciatica   . Spinal stenosis of lumbar region   .  Stroke ([place]) 2015   no residual symptoms  . Tremor, essential     SOCIAL HX:  Social History   Tobacco Use  . Smoking status: Former Smoker    Packs/day: 0.50    Years: 40.00    Pack years: 20.00    Types: Cigarettes    Start date: 02/26/1973    Quit date: 09/21/2018    Years since quitting: 0.7  . Smokeless tobacco: Former Systems developer    Quit date: 02/26/1998  . Tobacco comment: smokes 2-3 cig a week 02/12/19; per pt no longer smokes  Substance Use Topics  . Alcohol use: Yes    Alcohol/week: 20.0 standard drinks    Types: 20 Cans of beer per week    Comment: week     ALLERGIES:  Allergies  Allergen Reactions  . Penicillins Swelling, Rash and Other (See Comments)    Swelling in throat  Did it involve swelling of the face/tongue/throat, SOB, or low BP? yes Did it involve sudden or severe rash/hives, skin peeling, or any reaction on the inside of your mouth or nose? no Did you need to seek medical attention at a hospital or doctor's office? Yes When did it last happen?years  If all above answers are "NO", may proceed with cephalosporin use.    . Lidocaine Other (See Comments)    Unsure  At dentist's office     PERTINENT MEDICATIONS:  Outpatient Encounter Medications as of 07/09/2019  Medication Sig  . albuterol (PROVENTIL HFA;VENTOLIN HFA) 108 (90 Base) MCG/ACT inhaler Inhale 2 puffs into the lungs every 4 (four) hours as needed for wheezing or shortness of breath.   Marland Kitchen amikacin in dextrose solution Inject 1,500 mg into the vein.  Marland Kitchen arformoterol (BROVANA) 15 MCG/2ML NEBU Inhale into the lungs.  Marland Kitchen aspirin EC 81 MG EC tablet Take 1 tablet (81 mg total) by mouth daily.  Marland Kitchen azithromycin (ZITHROMAX) 500 MG tablet Take by mouth.  Marland Kitchen DALIRESP 250 MCG TABS Take 250 mcg by mouth at bedtime.  Marland Kitchen ethambutol (MYAMBUTOL) 400 MG tablet Take 2 tablets (800 mg total) by mouth daily.  . finasteride (PROSCAR) 5 MG tablet Take 1 tablet (5 mg total) by mouth daily.  Marland Kitchen FLUoxetine (PROZAC) 20 MG  capsule Take 20 mg by mouth daily.   . Fluticasone-Umeclidin-Vilant (TRELEGY ELLIPTA) 100-62.5-25 MCG/INH AEPB Inhale 2 puffs into the lungs at bedtime.   . folic acid (FOLVITE) 1 MG tablet Take 1 tablet (1 mg total) by mouth daily.  . furosemide (LASIX) 40 MG tablet Take 1 tablet (40 mg total) by mouth daily.  Marland Kitchen gabapentin (NEURONTIN) 300 MG capsule Take 900 mg by mouth at bedtime.   . lidocaine (LIDODERM) 5 % Place 1 patch onto the skin every 12 (twelve) hours. Remove & Discard patch within 12 hours or as directed by MD  . metoprolol succinate (TOPROL-XL) 25 MG 24 hr tablet Take 0.5 tablets (12.5 mg total) by mouth  daily.  . mirtazapine (REMERON) 7.5 MG tablet Take 1 tablet (7.5 mg total) by mouth at bedtime.  . montelukast (SINGULAIR) 10 MG tablet Take 10 mg by mouth at bedtime.  . Multiple Vitamin (MULTIVITAMIN WITH MINERALS) TABS tablet Take 1 tablet by mouth daily.  . nicotine (NICODERM CQ - DOSED IN MG/24 HOURS) 21 mg/24hr patch Place 1 patch (21 mg total) onto the skin daily.  Marland Kitchen omeprazole (PRILOSEC) 20 MG capsule Take 20 mg by mouth daily.  . Oxycodone HCl 10 MG TABS Take 10 mg by mouth 4 (four) times daily as needed for pain.  . sacubitril-valsartan (ENTRESTO) 24-26 MG Take 1 tablet by mouth 2 (two) times daily.  . tamsulosin (FLOMAX) 0.4 MG CAPS capsule Take 1 capsule (0.4 mg total) by mouth daily after supper.  . thiamine 100 MG tablet Take 1 tablet (100 mg total) by mouth daily.   No facility-administered encounter medications on file as of 07/09/2019.    PHYSICAL EXAM / ROS:   Current and past weights: 107 lbs, gaining some weight.  General: NAD, frail appearing, thin Cardiovascular: no chest pain reported, no edema  Pulmonary: no cough, no increased SOB, oxygen 3 L, states she's stopped smoking, seasonal allergies, needs humidified oxygen.  Abdomen: appetite fair, denies constipation, continent of bowel GU: denies dysuria, continent of urine MSK:  no joint and ROM  abnormalities, ambulatory, uses a cane on occasion .Fall x 1 due to dizziness Skin:  picc line in UE.  Neurological: Weakness, chronic pain, no insomnia  Jason Coop, NP [facility]  COVID-19 PATIENT SCREENING TOOL  Person answering questions: ____________Self______ _____   1.  Is the patient or any family member in the home showing any signs or symptoms regarding respiratory infection?               Person with Symptom- __________NA_________________  a. Fever                                                                          Yes___ No___          ___________________  b. Shortness of breath                                                    Yes___ No___          ___________________ c. Cough/congestion                                       Yes___  No___         ___________________ d. Body aches/pains                                                         Yes___ No___        ____________________ e. Gastrointestinal symptoms (diarrhea, nausea)  Yes___ No___        ____________________  2. Within the past 14 days, has anyone living in the home had any contact with someone with or under investigation for COVID-19?    Yes___ No_X_   Person __________________

## 2019-07-17 ENCOUNTER — Telehealth: Payer: Self-pay

## 2019-07-17 ENCOUNTER — Other Ambulatory Visit: Payer: Self-pay

## 2019-07-17 NOTE — Telephone Encounter (Signed)
Joseph Peters with [facility] that we will NOT pull picc. Extended IV Abx x 6 months. [place] also [phone] placed refill for MWF X 6  Months. They will report to nurse Merry Proud at [facility] to decide when he will get these refills next week. Merry Proud said patient thinks he is finished next week and wants PICC removal. Attempted to call patient and wife at all numbers listed and left message with my cell.

## 2019-07-21 ENCOUNTER — Telehealth: Payer: Self-pay

## 2019-07-21 ENCOUNTER — Ambulatory Visit: Payer: Medicare Other | Admitting: Infectious Diseases

## 2019-07-21 NOTE — Telephone Encounter (Signed)
Joseph Peters from [facility] called to report critical Glucose of 35. Advised patient missed appt today but to please contact his PCP. Patient reported feeling bad yesterday but was talking and walking and acting normal and felt better after he ate. Nurse will call PCP>

## 2019-08-12 ENCOUNTER — Telehealth: Payer: Self-pay

## 2019-08-12 NOTE — Telephone Encounter (Signed)
Rcvd call from Merry Proud at [place] stating PICC is flushing and giving meds but no return blood. This is causing them to venipuncture twice a week for labs. Merry Proud asked for portable xray orders, I requested latest labs be faxed and reached out to Ladona Ridgel who advised they have protocol to use cathflo and if not covered by insurance then MD orders outpatient line restoration. I explained to Merry Proud that we can do cathflo or TPA but denied xrays at this time. Patient will be scheduled for follow up in one week as well.

## 2019-08-20 ENCOUNTER — Ambulatory Visit: Payer: Medicare Other | Admitting: Infectious Diseases

## 2019-08-25 ENCOUNTER — Other Ambulatory Visit: Payer: Self-pay

## 2019-08-25 ENCOUNTER — Other Ambulatory Visit: Payer: Medicare Other | Admitting: Primary Care

## 2019-08-27 ENCOUNTER — Telehealth: Payer: Self-pay | Admitting: Infectious Diseases

## 2019-08-27 NOTE — Telephone Encounter (Signed)
Pt left Vm stated he had questions regarding his medication. He wishes for a phone call back

## 2019-08-28 ENCOUNTER — Telehealth: Payer: Self-pay

## 2019-08-28 NOTE — Telephone Encounter (Signed)
Let us make an appt for him to see me next Tuesday

## 2019-08-28 NOTE — Telephone Encounter (Signed)
rcvd VM from wife stating he has been in and out of hosp with CHF and we are supposed to be his doctor but none of his meds are being refilled- left her message that refills on meds go to doctor who filled them and advised her to call me back with bottles in hand or  call pharmacy to fax correct refills to correct doctors.

## 2019-09-03 ENCOUNTER — Other Ambulatory Visit
Admission: RE | Admit: 2019-09-03 | Discharge: 2019-09-03 | Disposition: A | Discharge status: Home / Self Care | Payer: Medicare Other | Source: Home / Self Care | Attending: Infectious Diseases | Admitting: Infectious Diseases

## 2019-09-03 ENCOUNTER — Ambulatory Visit
Admission: RE | Admit: 2019-09-03 | Discharge: 2019-09-03 | Disposition: A | Discharge status: Home / Self Care | Payer: Medicare Other | Attending: Infectious Diseases | Admitting: Infectious Diseases

## 2019-09-03 ENCOUNTER — Encounter: Payer: Self-pay | Admitting: Infectious Diseases

## 2019-09-03 ENCOUNTER — Ambulatory Visit
Admission: RE | Admit: 2019-09-03 | Discharge: 2019-09-03 | Disposition: A | Discharge status: Home / Self Care | Payer: Medicare Other | Source: Ambulatory Visit | Attending: Infectious Diseases | Admitting: Infectious Diseases

## 2019-09-03 ENCOUNTER — Other Ambulatory Visit: Payer: Self-pay

## 2019-09-03 ENCOUNTER — Ambulatory Visit: Payer: Medicare Other | Attending: Infectious Diseases | Admitting: Infectious Diseases

## 2019-09-03 VITALS — BP 179/89 | HR 93 | Temp 96.8°F | Resp 15 | Ht 65.0 in | Wt 119.0 lb

## 2019-09-03 DIAGNOSIS — I714 Abdominal aortic aneurysm, without rupture: Secondary | ICD-10-CM | POA: Diagnosis not present

## 2019-09-03 DIAGNOSIS — Z792 Long term (current) use of antibiotics: Secondary | ICD-10-CM | POA: Diagnosis not present

## 2019-09-03 DIAGNOSIS — E785 Hyperlipidemia, unspecified: Secondary | ICD-10-CM | POA: Diagnosis not present

## 2019-09-03 DIAGNOSIS — I25119 Atherosclerotic heart disease of native coronary artery with unspecified angina pectoris: Secondary | ICD-10-CM | POA: Insufficient documentation

## 2019-09-03 DIAGNOSIS — I712 Thoracic aortic aneurysm, without rupture: Secondary | ICD-10-CM | POA: Insufficient documentation

## 2019-09-03 DIAGNOSIS — Z7982 Long term (current) use of aspirin: Secondary | ICD-10-CM | POA: Insufficient documentation

## 2019-09-03 DIAGNOSIS — Z825 Family history of asthma and other chronic lower respiratory diseases: Secondary | ICD-10-CM | POA: Diagnosis not present

## 2019-09-03 DIAGNOSIS — Z87891 Personal history of nicotine dependence: Secondary | ICD-10-CM | POA: Diagnosis not present

## 2019-09-03 DIAGNOSIS — A310 Pulmonary mycobacterial infection: Secondary | ICD-10-CM | POA: Insufficient documentation

## 2019-09-03 DIAGNOSIS — I5020 Unspecified systolic (congestive) heart failure: Secondary | ICD-10-CM | POA: Insufficient documentation

## 2019-09-03 DIAGNOSIS — Z5181 Encounter for therapeutic drug level monitoring: Secondary | ICD-10-CM | POA: Diagnosis not present

## 2019-09-03 DIAGNOSIS — K219 Gastro-esophageal reflux disease without esophagitis: Secondary | ICD-10-CM | POA: Diagnosis not present

## 2019-09-03 DIAGNOSIS — Z79899 Other long term (current) drug therapy: Secondary | ICD-10-CM | POA: Insufficient documentation

## 2019-09-03 DIAGNOSIS — Z880 Allergy status to penicillin: Secondary | ICD-10-CM | POA: Insufficient documentation

## 2019-09-03 DIAGNOSIS — J449 Chronic obstructive pulmonary disease, unspecified: Secondary | ICD-10-CM | POA: Insufficient documentation

## 2019-09-03 DIAGNOSIS — I350 Nonrheumatic aortic (valve) stenosis: Secondary | ICD-10-CM | POA: Insufficient documentation

## 2019-09-03 DIAGNOSIS — Z7951 Long term (current) use of inhaled steroids: Secondary | ICD-10-CM | POA: Insufficient documentation

## 2019-09-03 DIAGNOSIS — I110 Hypertensive heart disease with heart failure: Secondary | ICD-10-CM | POA: Diagnosis not present

## 2019-09-03 LAB — CBC WITH DIFFERENTIAL/PLATELET
Abs Immature Granulocytes: 0.03 10*3/uL (ref 0.00–0.07)
Basophils Absolute: 0 10*3/uL (ref 0.0–0.1)
Basophils Relative: 0 %
Eosinophils Absolute: 0.3 10*3/uL (ref 0.0–0.5)
Eosinophils Relative: 3 %
HCT: 35.3 % — ABNORMAL LOW (ref 39.0–52.0)
Hemoglobin: 12.3 g/dL — ABNORMAL LOW (ref 13.0–17.0)
Immature Granulocytes: 0 %
Lymphocytes Relative: 18 %
Lymphs Abs: 2 10*3/uL (ref 0.7–4.0)
MCH: 31.6 pg (ref 26.0–34.0)
MCHC: 34.8 g/dL (ref 30.0–36.0)
MCV: 90.7 fL (ref 80.0–100.0)
Monocytes Absolute: 1 10*3/uL (ref 0.1–1.0)
Monocytes Relative: 9 %
Neutro Abs: 7.8 10*3/uL — ABNORMAL HIGH (ref 1.7–7.7)
Neutrophils Relative %: 70 %
Platelets: 248 10*3/uL (ref 150–400)
RBC: 3.89 MIL/uL — ABNORMAL LOW (ref 4.22–5.81)
RDW: 15.9 % — ABNORMAL HIGH (ref 11.5–15.5)
WBC: 11.2 10*3/uL — ABNORMAL HIGH (ref 4.0–10.5)
nRBC: 0 % (ref 0.0–0.2)

## 2019-09-03 LAB — COMPREHENSIVE METABOLIC PANEL
ALT: 12 U/L (ref 0–44)
AST: 18 U/L (ref 15–41)
Albumin: 3.9 g/dL (ref 3.5–5.0)
Alkaline Phosphatase: 130 U/L — ABNORMAL HIGH (ref 38–126)
Anion gap: 9 (ref 5–15)
BUN: 16 mg/dL (ref 8–23)
CO2: 25 mmol/L (ref 22–32)
Calcium: 9.8 mg/dL (ref 8.9–10.3)
Chloride: 101 mmol/L (ref 98–111)
Creatinine, Ser: 1.06 mg/dL (ref 0.61–1.24)
GFR calc Af Amer: >60 mL/min (ref >60)
GFR calc non Af Amer: >60 mL/min (ref >60)
Glucose, Bld: 94 mg/dL (ref 70–99)
Potassium: 5.6 mmol/L — ABNORMAL HIGH (ref 3.5–5.1)
Sodium: 135 mmol/L (ref 135–145)
Total Bilirubin: 1 mg/dL (ref 0.3–1.2)
Total Protein: 8.3 g/dL — ABNORMAL HIGH (ref 6.5–8.1)

## 2019-09-03 MED ORDER — AZITHROMYCIN 500 MG PO TABS: 500.0000 mg | ORAL_TABLET | Freq: Every day | ORAL | 3 refills | Status: AC

## 2019-09-03 MED ORDER — ETHAMBUTOL HCL 400 MG PO TABS: 800.0000 mg | ORAL_TABLET | Freq: Every day | ORAL | 3 refills | Status: AC

## 2019-09-03 MED ORDER — RIFABUTIN 150 MG PO CAPS: 300.0000 mg | ORAL_CAPSULE | Freq: Every day | ORAL | 3 refills | Status: AC

## 2019-09-03 NOTE — Patient Instructions (Addendum)
You are here for follow up for Pulmonary MAC infection- You recently got discharged from [facility]. The amikacin which was started by [facility] in April was stopped during the last admission because of ringing in your ears and some hearing issues. You currently are taking azithromycin 566m  Ethambutol 8050mRifabutin 30061mill send prescriptions to your pharmacy as you are only taking ethambutol but not the other 2 meds since you got discharged from the hospital  I spoke to your primary care doctor and you are enrolled in [employer] a case manger program- please reach out to them for care coordination

## 2019-09-03 NOTE — Progress Notes (Signed)
NAME: Joseph Peters  DOB: Mar 30, 1955  MRN: [id number]  Date/Time: 09/03/2019 11:43 AM   Subjective:  Follow-up visit after recent hospitalization at [facility]. Patient is currently being treated by me for pulmonary MAC infection. Was in [facility] between 08/14/19-08/18/19 for chest pain . After reviewing [facility] discharge from care everywhere following is the summary. He initially presented to the [facility] ED with chest pain which was stabbing in nature and squeezing sensation around his chest and radiating to the left arm back and his neck.    He has a history of coronary artery disease with HFrEF.  Prior left heart catheterization done on 05/29/2019 showed CAD 60 to 70% of the RCA and 50 to 60% of the LAD which were determined to be not flow reducing therefore stenting was not performed. Echo done on 619 showed EF of 45 to 50% which was 20% on 05/25/2019. Aortic stenosis: The echo from 05/25/2019 showed moderate to severe aortic stenosis whereas theFollow-up left heart catheterization from 05/29/2019 and aortic valve area of 0.91.  He was evaluated with a CT TAVR which did not show severe stenosis and therefore TAVR was not pursued.  Repeat echo from 08/15/2019 showed aortic stenosis to be moderate.  So it was not thought to be a low flow state causing chest pain  AAA/penetrating aortic ulcer: Patient with a known dilated ascending aorta at 4.2 cm that was unchanged on CTA from 08/14/2019.  There was however an interval increase in the size of the infrarenal fusiform aneurysm also with curvilinear hyperdensities within the mural thrombus component of the aneurysm which may have been due to hemorrhage.  At this time was also found to have PA view of the descending thoracic aorta a 7 mm wide and 6 mm deep.  Vascular did not think his chest pain was from that.  Headache/visual changes: He was complaining of headache and some black spots.  He was evaluated for giant cell arteritis and it was not considered to be the case.  He was  evaluated by rheumatology as well.  The MRI of the head and MRA of the head and neck obtained during the stay showed no strokes but showed 80% stenosis of the right internal carotid artery.  Plan for vascular surgery was to follow him as outpatient.  Pulmonary MAC: CT imaging showed multiple cavitary lesions.  MAC was positive in October 2020 normal 2020.  He began treatment with azithromycin ethambutol and rifabutin on 03/24/2019 and amikacin was added in April 2021 at [facility] because of the cavitary component of the disease.  Given concern for possible hearing loss audiology evaluated him on 621 with minimal changes from prior evaluation.  After also evaluated him from 621 showing no ethambutol induced eye changes.  Amikacin was stopped after ID saw him and they consider the risks to be more than benefit.  Patient was on 700 mg of ethambutol which was increased to 800 mg at [facility].  Patient says he is feeling better He needs oxygen He is here with his wife [name]. Both of them are confused with the medication medications he is taking.  On reviewing [facility] medical records it is very clear what medicines he has to continue to take which is tamsulosin, sacubitril/valsartan, Roflumilast, rifabutin, nicotine patch,  Arformoterol, aspirin, azithromycin, budesonide inhaled nebulizer, cyanocobalamin, ethambutol. They stopped atorvastatin, metoprolol.  They started carvedilol 12.5 mg twice a day, Zetia  10 mg once a day, Rosuvastatin 40 mg once a day.  They made a change to ethambutol  and stop 700 mg he is now taking 18 mg a day which is 2 tablets of 400 mg strength      ? Joseph Peters is a 64 y.o. male with a history of COPD, MAC infection of the lungs, hypertension, hyperlipidemia, GERD, CAD, CHF with a EF of 35 to 40%, alcohol abuse, tobacco abuse, AAA, BPH  Patient was diagnosed with acute bacterial avium-intracellulare infection of his lungs in in November 2020 x 3 sputum's.  He also had CT chest which  revealed fibrocavitary lesion on the right side of his lung.  Patient had seen me and started him on triple therapy with ethambutol, rifampin and azithromycin.  Patient was not able to tolerate this medication because of GI symptoms and had been stopping the medications intermittently.  Then because of worsening shortness of breath IV amikacin was also added but he could not take that because of increasing creatinine. Patient had been in the hospital multiple times in the last few months because of shortness of breath.  He was admitted to [facility] between 06/06/2019 until 06/12/2019 to the pulmonary service.  The pulmonologist that spoke with me.  He was restarted on MAC therapy with IV amikacin, p.o. ethambutol 700 mg, p.o. azithromycin 500 mg, rifabutin 300 mg.  A PICC line was placed and he was discharged home.  This PICC line was removed during this hospitalization June 2021 to [facility] as amikacin was stopped because of concerns of ototoxicity.   Past Medical History:  Diagnosis Date  . AAA (abdominal aortic aneurysm) without rupture ([place])   . Abdominal aneurysm ([place]) 2015   being followed but not large enough to surgically treat  . Alcoholism ([place])   . Anxiety   . Arthritis   . Asthma   . Benign hypertension 07/16/2014  . Chest pain on exertion 07/16/2014  . Chronic pain syndrome   . Coccydynia 02/08/2012  . COPD (chronic obstructive pulmonary disease) ([place])    patient smokes  . Coronary artery disease   . 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 Depression   . 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 Dyspnea   . Frequent falls   . GERD (gastroesophageal reflux disease)   . Hyperlipidemia   . IBS (irritable bowel syndrome)   . Myocardial infarction ([place]) 2010  . Neuropathy   . Pneumonia   . Prostatitis   . Restless leg syndrome   . Sciatica   . Spinal stenosis of lumbar region   . Stroke ([place]) 2015   no residual symptoms  . Tremor, essential     Past  Surgical History:  Procedure Laterality Date  . CARDIAC CATHETERIZATION  2012  . COLONOSCOPY WITH PROPOFOL N/A 11/14/2016   Procedure: COLONOSCOPY WITH PROPOFOL;  Surgeon: Manya Silvas, MD;  Location: [facility] ENDOSCOPY;  Service: Endoscopy;  Laterality: N/A;  . ESOPHAGOGASTRODUODENOSCOPY (EGD) WITH PROPOFOL N/A 11/14/2016   Procedure: ESOPHAGOGASTRODUODENOSCOPY (EGD) WITH PROPOFOL;  Surgeon: Manya Silvas, MD;  Location: [facility] ENDOSCOPY;  Service: Endoscopy;  Laterality: N/A;  . JOINT REPLACEMENT Right    hip  . KNEE SURGERY     right  . SHOULDER ARTHROSCOPY WITH OPEN ROTATOR CUFF REPAIR Right 08/20/2017   Procedure: SHOULDER ARTHROSCOPY WITH OPEN ROTATOR CUFF REPAIR;  Surgeon: Corky Mull, MD;  Location: [facility] ORS;  Service: Orthopedics;  Laterality: Right;  . SHOULDER ARTHROSCOPY WITH SUBACROMIAL DECOMPRESSION, ROTATOR CUFF REPAIR AND BICEP TENDON REPAIR Left 05/10/2016   Procedure: SHOULDER ARTHROSCOPY WITH  DEBTRIDEMENT, DECOMPRESSION, REPAIR OF MASSIVE ROTATOR CUFF TEAR AND BICEP TENODESIS;  Surgeon: Corky Mull, MD;  Location: [facility] ORS;  Service: Orthopedics;  Laterality: Left;  Massive cuff tear  . TOTAL HIP ARTHROPLASTY Right 07/01/2018   Procedure: TOTAL HIP ARTHROPLASTY - RIGHT;  Surgeon: Corky Mull, MD;  Location: [facility] ORS;  Service: Orthopedics;  Laterality: Right;  Marland Kitchen VIDEO BRONCHOSCOPY WITH ENDOBRONCHIAL NAVIGATION N/A 02/13/2019   Procedure: VIDEO BRONCHOSCOPY WITH ENDOBRONCHIAL NAVIGATION;  Surgeon: Ottie Glazier, MD;  Location: [facility] ORS;  Service: Thoracic;  Laterality: N/A;  . VIDEO BRONCHOSCOPY WITH ENDOBRONCHIAL NAVIGATION N/A 03/13/2019   Procedure: VIDEO BRONCHOSCOPY WITH ENDOBRONCHIAL NAVIGATION;  Surgeon: Ottie Glazier, MD;  Location: [facility] ORS;  Service: Thoracic;  Laterality: N/A;  . VIDEO BRONCHOSCOPY WITH ENDOBRONCHIAL ULTRASOUND N/A 02/13/2019   Procedure: VIDEO BRONCHOSCOPY WITH ENDOBRONCHIAL ULTRASOUND;  Surgeon: Ottie Glazier, MD;  Location: [facility] ORS;   Service: Thoracic;  Laterality: N/A;  . VIDEO BRONCHOSCOPY WITH ENDOBRONCHIAL ULTRASOUND N/A 03/13/2019   Procedure: VIDEO BRONCHOSCOPY WITH ENDOBRONCHIAL ULTRASOUND;  Surgeon: Ottie Glazier, MD;  Location: [facility] ORS;  Service: Thoracic;  Laterality: N/A;    Social History   Socioeconomic History  . Marital status: Married    Spouse name: Not on file  . Number of children: Not on file  . Years of education: Not on file  . Highest education level: Not on file  Occupational History  . Not on file  Tobacco Use  . Smoking status: Former Smoker    Packs/day: 0.50    Years: 40.00    Pack years: 20.00    Types: Cigarettes    Start date: 02/26/1973    Quit date: 09/21/2018    Years since quitting: 0.9  . Smokeless tobacco: Former Systems developer    Quit date: 02/26/1998  . Tobacco comment: smokes 2-3 cig a week 02/12/19; per pt no longer smokes  Vaping Use  . Vaping Use: Never used  Substance and Sexual Activity  . Alcohol use: Yes    Alcohol/week: 20.0 standard drinks    Types: 20 Cans of beer per week    Comment: week   . Drug use: Yes    Comment: prescribed hydrocodone  . Sexual activity: Not on file  Other Topics Concern  . Not on file  Social History Narrative   Patient is a Nature conservation officer.;  He lives with his wife at home.  Longstanding history of smoking; quit recently.  Denies alcohol abuse.   Social Determinants of Health   Financial Resource Strain:   . Difficulty of Paying Living Expenses:   Food Insecurity:   . Worried About Charity fundraiser in the Last Year:   . Arboriculturist in the Last Year:   Transportation Needs:   . Film/video editor (Medical):   Marland Kitchen Lack of Transportation (Non-Medical):   Physical Activity:   . Days of Exercise per Week:   . Minutes of Exercise per Session:   Stress:   . Feeling of Stress :   Social Connections:   . Frequency of Communication with Friends and Family:   . Frequency of Social Gatherings with Friends and Family:   .  Attends Religious Services:   . Active Member of Clubs or Organizations:   . Attends Archivist Meetings:   Marland Kitchen Marital Status:   Intimate Partner Violence:   . Fear of Current or Ex-Partner:   . Emotionally Abused:   Marland Kitchen Physically Abused:   . Sexually Abused:  Family History  Problem Relation Age of Onset  . Cancer Mother   . COPD Father 57       black lung   Allergies  Allergen Reactions  . Penicillins Swelling, Rash and Other (See Comments)    Swelling in throat  Did it involve swelling of the face/tongue/throat, SOB, or low BP? yes Did it involve sudden or severe rash/hives, skin peeling, or any reaction on the inside of your mouth or nose? no Did you need to seek medical attention at a hospital or doctor's office? Yes When did it last happen?years  If all above answers are "NO", may proceed with cephalosporin use.    . Lidocaine Other (See Comments)    Unsure  At dentist's office     ? Current Outpatient Medications  Medication Sig Dispense Refill  . albuterol (PROVENTIL HFA;VENTOLIN HFA) 108 (90 Base) MCG/ACT inhaler Inhale 2 puffs into the lungs every 4 (four) hours as needed for wheezing or shortness of breath.     Marland Kitchen arformoterol (BROVANA) 15 MCG/2ML NEBU Inhale into the lungs.    Marland Kitchen aspirin EC 81 MG EC tablet Take 1 tablet (81 mg total) by mouth daily.    Marland Kitchen azithromycin (ZITHROMAX) 500 MG tablet Take by mouth.    . carvedilol (COREG) 12.5 MG tablet Take 12.5 mg by mouth 2 (two) times daily with a meal.    . DALIRESP 250 MCG TABS Take 250 mcg by mouth at bedtime. 30 tablet 0  . ethambutol (MYAMBUTOL) 400 MG tablet Take 2 tablets (800 mg total) by mouth daily. 60 tablet 0  . ezetimibe-simvastatin (VYTORIN) 10-10 MG tablet Take 1 tablet by mouth at bedtime.    . finasteride (PROSCAR) 5 MG tablet Take 1 tablet (5 mg total) by mouth daily. 30 tablet 0  . FLUoxetine (PROZAC) 20 MG capsule Take 20 mg by mouth daily.     . Fluticasone-Umeclidin-Vilant  (TRELEGY ELLIPTA) 100-62.5-25 MCG/INH AEPB Inhale 2 puffs into the lungs at bedtime.     . folic acid (FOLVITE) 1 MG tablet Take 1 tablet (1 mg total) by mouth daily. 30 tablet 0  . furosemide (LASIX) 40 MG tablet Take 1 tablet (40 mg total) by mouth daily. 30 tablet 0  . gabapentin (NEURONTIN) 300 MG capsule Take 900 mg by mouth at bedtime.     . lidocaine (LIDODERM) 5 % Place 1 patch onto the skin every 12 (twelve) hours. Remove & Discard patch within 12 hours or as directed by MD 10 patch 0  . montelukast (SINGULAIR) 10 MG tablet Take 10 mg by mouth at bedtime.    . Multiple Vitamin (MULTIVITAMIN WITH MINERALS) TABS tablet Take 1 tablet by mouth daily.    . nicotine (NICODERM CQ - DOSED IN MG/24 HOURS) 21 mg/24hr patch Place 1 patch (21 mg total) onto the skin daily. 14 patch 0  . omeprazole (PRILOSEC) 20 MG capsule Take 20 mg by mouth daily.    . rosuvastatin (CRESTOR) 40 MG tablet Take 40 mg by mouth daily.    . sacubitril-valsartan (ENTRESTO) 24-26 MG Take 1 tablet by mouth 2 (two) times daily.    . tamsulosin (FLOMAX) 0.4 MG CAPS capsule Take 1 capsule (0.4 mg total) by mouth daily after supper. 90 capsule 1  . thiamine 100 MG tablet Take 1 tablet (100 mg total) by mouth daily. 30 tablet 0  . metoprolol succinate (TOPROL-XL) 25 MG 24 hr tablet Take 0.5 tablets (12.5 mg total) by mouth daily. 15 tablet 0  .  mirtazapine (REMERON) 7.5 MG tablet Take 1 tablet (7.5 mg total) by mouth at bedtime. 30 tablet 0  . Oxycodone HCl 10 MG TABS Take 10 mg by mouth 4 (four) times daily as needed for pain. (Patient not taking: Reported on 09/03/2019)     No current facility-administered medications for this visit.     Abtx:  Anti-infectives (From admission, onward)   None      REVIEW OF SYSTEMS:  Const: negative fever, negative chills, has weight loss Eyes: negative diplopia or visual changes, negative eye pain ENT: negative coryza, negative sore throat Resp: Cough and shortness of breath. .   History of hemoptysis Cards: Has chest pain, palpitations, lower extremity edema Has pain on the right upper quadrant right lower chest. GU: negative for frequency, dysuria and hematuria GI: Negative for abdominal pain, diarrhea, bleeding, constipation Skin: negative for rash and pruritus Heme: negative for easy bruising and gum/nose bleeding MS: Generalized weakness. Neurolo:negative for headaches, dizziness, vertigo, memory problems  Psych: anxiety, depression  Endocrine: negative for thyroid, diabetes Allergy/Immunology-as above.  Penicillin causes rash. Objective:  VITALS:  BP (!) 179/89   Pulse 93   Temp (!) 96.8 F (36 C)   Resp 15   Ht _0  (1.651 m)   Wt 119 lb (54 kg)   SpO2 93% Comment: 3 liters oxygen  BMI 19.80 kg/m  PHYSICAL EXAM:  General: Alert, cooperative, no distress, appears stated age.  on nasal oxygen. Has gained 5 pounds Head: Normocephalic, without obvious abnormality, atraumatic. Eyes: Conjunctivae clear, anicteric sclerae. Pupils are equal ENT did not examine RS :air entry crepts on the right side bilateral  Hss 1s2  Abdomen: Soft, non-tender,not distended. Bowel sounds normal. No masses Extremities: atraumatic, no cyanosis. No edema. No clubbing Skin: Did not examine well as patient is on wheelchair Lymph: Cervical, supraclavicular normal. Neurologic: Grossly non-focal Pertinent Labs Lab Results 06/22/2019 glucose was 31 but suboptimal specimen Creatinine 1.20 Amikacin trough 1.8.  Goal is less than 4   ? 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 Recent hospitalization to [facility] when the amikacin was stopped and PICC line removed secondary to concern for ototoxicity.  Patient is supposed to take ethambutol 800 mg once a day, rifabutin 300 mg once a day and azithromycin 20 mg once a day.  He is only taking ethambutol now and is ran out of the other 2 medications. We will get labs today and already sent  prescriptions for the other 2 medications as well We will also get a chest x-ray to see the progress  COPD followed by Dr. Raul Del  CAD at [facility] had LHA and was found to have known flow obstructing stenosis.  Headaches and blurred vision and black spots.  Evaluated by ophthalmology while he was at [facility] in June 2021 and ethambutol adverse effects ruled out  Abdominal aortic aneurysm.  Infrarenal aneurysm enlarging. Also has a proximal aorta aneurysm  Right ICA 80% stenosis  Aortic stenosis with a area of 0.91  HFrEF on furosemide, spironolactone, Coreg, Entresto.  BPH on finasteride and tamsulosin  Medication reconciliation done with patient and his wife.  Told him that there are a few prescriptions which he does not have any refills and he has to call and discuss with his primary care physician to get those refills . I discussed with his primary care physician Dr. Janene Harvey to enroll him in a case management program.  She recommended [facility] which has already been in touch with him and enrolled  him in their program.  He had an appointment with them on 7-21 but he was a no-show.  Dr. Janene Harvey will call him with follow-up appointment  We will get chest x-ray and CMP and CBC today  Follow-up in 2 weeks   Discussed with patient and his wife and his PCP.  Spent nearly an hour with the patient counseling about his medications, adherence, understanding all the illness that he has and follow-up with the respective doctors Note:  This document was prepared using Dragon voice recognition software and may include unintentional dictation errors.

## 2019-09-07 ENCOUNTER — Telehealth: Payer: Self-pay | Admitting: Primary Care

## 2019-09-07 NOTE — Telephone Encounter (Signed)
T/c to patient and [phone] . States he is headed to ED as he is spitting up blood. Asks for a call back this  pm to check.

## 2019-09-17 ENCOUNTER — Other Ambulatory Visit: Payer: Medicare Other | Admitting: Primary Care

## 2019-09-17 ENCOUNTER — Other Ambulatory Visit: Payer: Self-pay

## 2019-09-17 DIAGNOSIS — R0602 Shortness of breath: Secondary | ICD-10-CM

## 2019-09-17 DIAGNOSIS — R911 Solitary pulmonary nodule: Secondary | ICD-10-CM

## 2019-09-17 DIAGNOSIS — Z515 Encounter for palliative care: Secondary | ICD-10-CM

## 2019-09-17 DIAGNOSIS — I7121 Aneurysm of the ascending aorta, without rupture: Secondary | ICD-10-CM

## 2019-09-17 NOTE — Progress Notes (Signed)
[facility] Consult Note Telephone: [phone]  Fax: [phone]  PATIENT NAME: Joseph Peters [address] [place] [address] [phone] (home)  DOB: October 26, 1955 MRN: [id number]  PRIMARY CARE PROVIDER:    Barbaraann Boys, MD,  [place] [address] [phone]  REFERRING PROVIDER:   Barbaraann Peters, [place],  [place] [address] [phone]  RESPONSIBLE PARTY:   Extended Emergency Contact Information Primary Emergency Contact: Joseph Peters Address: [address] [place], [place] [address] [place] Phone: [phone] Mobile Phone: [phone] Relation: Spouse  I met face to face with patient and family in home.  ASSESSMENT AND RECOMMENDATIONS:   1. Advance Care Planning/Goals of Care: Goals include to maximize quality of life and symptom management. Our advance care planning conversation included a discussion about:     The value and importance of advance care planning   Experiences with loved ones who have been seriously ill or have died   Exploration of personal, cultural or spiritual beliefs that might influence medical decisions   Exploration of goals of care in the event of a sudden injury or illness   Identification and preparation of a healthcare agent   Review for reasons and how to make  an  advance directive document .  Patient could not find MOST I had left earlier. I again gave him and his wife a Five Wishes book and a MOST form. We discussed is health which is getting worse, and his concern for his wife's health as well. They will review but will likely need to be led in this discussion for clarification. They will review book and we will meet again in a month.   Joseph Peters is concerned now about his advancing disease. He recently had a scan and is waiting for the results. He states he feels he is getting worse but in any event,  endorses desire to have any bad news out in the open.  2. Symptom Management:   Caregiver strain: I initially thought wife was caring for patient but he endorses strain at having to have to care for her. They need assistance with caregiving. I recommend PCP make Medicaid PCS referral please.   Safety: Discussed safety issues esp if he or wife were to fall. He is concerned for his wife's welfare when he is ill or away. I gave the number of the Medical Alert program at [facility] for a $21 / month plan. He states he will f/u.   Rehab: Currently has  [facility] for PT. Has home health nurse as well who is helping with medications. Pt states these are very confusing for him and his wife. I recommend changing to a pharmacy which will pre package daily doses, such as [facility] in [place].   Pain: Endorses chronic pain and recent retirement of his pain management MD at [employer] in [place]. States he is out of narcotic and he has called for a f/u appt. Encouraged to consider alternative modalities such as massage and ice, and otc acetaminophen.  3. Family /Caregiver/Community Supports: Cares for wife, has son in the area but he works out of town some. Has friends, support but needs help with adls.  4. Cognitive / Functional decline: A and O x 3. Health literacy deficits. DOE is increasing due to lung disease. Voices need for help with adls.  5.  Follow up Palliative Care Visit: Palliative care will continue to follow for goals of care clarification and symptom management. Return 4 weeks or prn.  I spent  60 minutes providing this consultation,  from 1300 to 1400. More than 50% of the time in this consultation was spent coordinating communication.   CHIEF COMPLAINT: SOB, DOE, neck pain  HISTORY OF PRESENT ILLNESS:  Joseph Peters is a 64 y.o. year old male with multiple medical problems including DDD with myopathy, chronic pain, oxygen dependence, AAA, COPD, tobacco dependence. Palliative Care was asked  to follow this patient by consultation request of Joseph Boys, MD to help address advance care planning and goals of care. This is a follow up visit.  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   Depression    Disc disease with myelopathy, 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   Dyspnea    Frequent falls    GERD (gastroesophageal reflux disease)    Hyperlipidemia    IBS (irritable bowel syndrome)    Myocardial infarction ([facility]) 2010   Neuropathy    Pneumonia    Prostatitis    Restless leg syndrome    Sciatica    Spinal stenosis of lumbar region    Stroke ([facility]) 2015   no residual symptoms   Tremor, essential     SOCIAL HX:  Social History   Tobacco Use   Smoking status: Former Smoker    Packs/day: 0.50    Years: 40.00    Pack years: 20.00    Types: Cigarettes    Start date: 02/26/1973    Quit date: 09/21/2018    Years since quitting: 0.9   Smokeless tobacco: Former Systems developer    Quit date: 02/26/1998   Tobacco comment: smokes 2-3 cig a week 02/12/19; per pt no longer smokes  Substance Use Topics   Alcohol use: Yes    Alcohol/week: 20.0 standard drinks    Types: 20 Cans of beer per week    Comment: week    FAMILY HX:  Family History  Problem Relation Age of Onset   Cancer Mother    COPD Father 6       black lung    ALLERGIES:  Allergies  Allergen Reactions    Penicillins Swelling, Rash and Other (See Comments)    Swelling in throat  Did it involve swelling of the face/tongue/throat, SOB, or low BP? yes Did it involve sudden or severe rash/hives, skin peeling, or any reaction on the inside of your mouth or nose? no Did you need to seek medical attention at a hospital or doctor's office? Yes When did it last happen?years  If all above answers are "NO", may proceed with cephalosporin use.     Lidocaine Other (See Comments)    Unsure  At dentist's office     PERTINENT MEDICATIONS:  Outpatient Encounter  Medications as of 09/17/2019  Medication Sig   albuterol (PROVENTIL HFA;VENTOLIN HFA) 108 (90 Base) MCG/ACT inhaler Inhale 2 puffs into the lungs every 4 (four) hours as needed for wheezing or shortness of breath.    arformoterol (BROVANA) 15 MCG/2ML NEBU Inhale into the lungs.   aspirin EC 81 MG EC tablet Take 1 tablet (81 mg total) by mouth daily.   azithromycin (ZITHROMAX) 500 MG tablet Take 1 tablet (500 mg total) by mouth daily.   carvedilol (COREG) 12.5 MG tablet Take 12.5 mg by mouth 2 (two) times daily with a meal.   DALIRESP 250 MCG TABS Take 250 mcg by mouth at bedtime.   ethambutol (MYAMBUTOL) 400 MG tablet Take 2 tablets (800 mg total) by mouth daily.   ezetimibe-simvastatin (VYTORIN) 10-10 MG tablet Take 1 tablet by mouth at bedtime.   finasteride (PROSCAR) 5 MG tablet Take 1 tablet (5 mg total) by mouth daily.   FLUoxetine (PROZAC) 20 MG capsule Take 20 mg by mouth daily.    Fluticasone-Umeclidin-Vilant (TRELEGY ELLIPTA) 100-62.5-25 MCG/INH AEPB Inhale 2 puffs into the lungs at bedtime.    folic acid (FOLVITE) 1 MG tablet Take 1 tablet (1 mg total) by mouth daily.   furosemide (LASIX) 40 MG tablet Take 1 tablet (40 mg total) by mouth daily.   gabapentin (NEURONTIN) 300 MG capsule Take 900 mg by mouth at bedtime.    lidocaine (LIDODERM) 5 % Place 1 patch onto the skin every 12 (twelve) hours. Remove & Discard  patch within 12 hours or as directed by MD   metoprolol succinate (TOPROL-XL) 25 MG 24 hr tablet Take 0.5 tablets (12.5 mg total) by mouth daily.   mirtazapine (REMERON) 7.5 MG tablet Take 1 tablet (7.5 mg total) by mouth at bedtime.   montelukast (SINGULAIR) 10 MG tablet Take 10 mg by mouth at bedtime.   Multiple Vitamin (MULTIVITAMIN WITH MINERALS) TABS tablet Take 1 tablet by mouth daily.   nicotine (NICODERM CQ - DOSED IN MG/24 HOURS) 21 mg/24hr patch Place 1 patch (21 mg total) onto the skin daily.   omeprazole (PRILOSEC) 20 MG capsule Take 20 mg by mouth daily.   Oxycodone HCl 10 MG TABS Take 10 mg by mouth 4 (four) times daily as needed for pain. (Patient not taking: Reported on 09/03/2019)   rifabutin (MYCOBUTIN) 150 MG capsule Take 2 capsules (300 mg total) by mouth daily.   rosuvastatin (CRESTOR) 40 MG tablet Take 40 mg by mouth daily.   sacubitril-valsartan (ENTRESTO) 24-26 MG Take 1 tablet by mouth 2 (two) times daily.   tamsulosin (FLOMAX) 0.4 MG CAPS capsule Take 1 capsule (0.4 mg total) by mouth daily after supper.   thiamine 100 MG tablet Take 1 tablet (100 mg total) by mouth daily.   No facility-administered encounter medications on file as of 09/17/2019.    PHYSICAL EXAM / ROS:   Current and past weights: 119 lbs per previous record General: NAD, frail appearing, thin Cardiovascular: S1S2, irreg rate, no chest pain reported, slight LE edema  Pulmonary: Lungs moving air on L and Peters, Wheezes on Peters in bases, + cough, + increased SOB, DOE, oxygen 2 L , PO2=99% at rest Abdomen: appetite fair, denies  constipation, continent of bowel GU: denies dysuria, continent of urine MSK:  no joint and ROM abnormalities, ambulatory (I) Skin: no rashes or wounds reported Neurological: Weakness, chronic pain  Jason Coop, NP , DNP, MPH, [facility]  COVID-19 PATIENT SCREENING TOOL  Person answering questions: ___________Self_____ _____  1.  Is the patient or any family  member in the home showing any signs or symptoms regarding respiratory infection?               Person with Symptom- __________NA_________________  a. Fever                                                                          Yes___ No___          ___________________  b. Shortness of breath                                                    Yes___ No___          ___________________ c. Cough/congestion                                       Yes___  No___         ___________________ d. Body aches/pains                                                         Yes___ No___        ____________________ e. Gastrointestinal symptoms (diarrhea, nausea)           Yes___ No___        ____________________  2. Within the past 14 days, has anyone living in the home had any contact with someone with or under investigation for COVID-19?    Yes___ No_X_   Person __________________

## 2019-10-05 ENCOUNTER — Other Ambulatory Visit: Payer: Self-pay

## 2019-10-05 DIAGNOSIS — N401 Enlarged prostate with lower urinary tract symptoms: Secondary | ICD-10-CM

## 2019-10-05 MED ORDER — TAMSULOSIN HCL 0.4 MG PO CAPS: 0.4000 mg | ORAL_CAPSULE | Freq: Every day | ORAL | 1 refills | Status: AC

## 2019-10-20 ENCOUNTER — Other Ambulatory Visit: Payer: Self-pay

## 2019-10-20 ENCOUNTER — Other Ambulatory Visit: Payer: Medicare Other | Admitting: Primary Care

## 2019-10-22 ENCOUNTER — Telehealth: Payer: Self-pay | Admitting: Primary Care

## 2019-10-22 NOTE — Telephone Encounter (Signed)
T/c to follow up with scheduling palliative care appointment. Message left.

## 2019-10-25 ENCOUNTER — Emergency Department
Admission: EM | Admit: 2019-10-25 | Discharge: 2019-10-28 | Disposition: E | Payer: Medicare Other | Attending: Emergency Medicine | Admitting: Emergency Medicine

## 2019-10-25 DIAGNOSIS — R0602 Shortness of breath: Secondary | ICD-10-CM | POA: Diagnosis present

## 2019-10-25 DIAGNOSIS — I2510 Atherosclerotic heart disease of native coronary artery without angina pectoris: Secondary | ICD-10-CM | POA: Insufficient documentation

## 2019-10-25 DIAGNOSIS — I469 Cardiac arrest, cause unspecified: Secondary | ICD-10-CM

## 2019-10-25 DIAGNOSIS — I5023 Acute on chronic systolic (congestive) heart failure: Secondary | ICD-10-CM | POA: Diagnosis not present

## 2019-10-25 DIAGNOSIS — I110 Hypertensive heart disease with heart failure: Secondary | ICD-10-CM | POA: Insufficient documentation

## 2019-10-25 DIAGNOSIS — J449 Chronic obstructive pulmonary disease, unspecified: Secondary | ICD-10-CM | POA: Insufficient documentation

## 2019-10-25 DIAGNOSIS — R092 Respiratory arrest: Secondary | ICD-10-CM

## 2019-10-25 DIAGNOSIS — Z87891 Personal history of nicotine dependence: Secondary | ICD-10-CM | POA: Insufficient documentation

## 2019-10-25 DIAGNOSIS — I10 Essential (primary) hypertension: Secondary | ICD-10-CM | POA: Diagnosis not present

## 2019-10-25 DIAGNOSIS — Z79899 Other long term (current) drug therapy: Secondary | ICD-10-CM | POA: Insufficient documentation

## 2019-10-25 LAB — GLUCOSE, CAPILLARY
Glucose-Capillary: 50 mg/dL — ABNORMAL LOW (ref 70–99)
Glucose-Capillary: 51 mg/dL — ABNORMAL LOW (ref 70–99)

## 2019-10-28 MED FILL — Medication: Qty: 1 | Status: AC

## 2019-10-28 NOTE — ED Notes (Signed)
Time of Death 0345 a.m.

## 2019-10-28 NOTE — ED Notes (Signed)
Patient to ED via EMS from home for shortness of breath for couple of days.  EMS reported that O2 sats in the 80's even on his oxygen at 3 liters all the time.  EMS placed patient on NRB at 15 liters and sats increased up into the 90's and 100%.  On arrival to ED patient became apneic an pulseless. CPR started.

## 2019-10-28 NOTE — ED Notes (Signed)
5520 IO access to right lower leg and left lower leg by Dr. Beather Arbour. 0332 1 amp of epi given 0333 1 amp bicarb given 0334 1 amp epi given 0334 CBG less than 50 0335 1 amp D50 given 0336 1 amp epi given 0336 pulse check - wide complex noted on zoll, no pulse 0336 shocked at 200 j 0338 1 amp bicarb given 0339 1 amp epi given 0340 CBG 51 0341 1 amp epi given 0342 1 amp D50 given 0342 pulse check - no pulse 0343 18 g angio cath placed using ultrasound to right upper arm 0344 1 amp epi given 0345 CPR stopped, code called by Dr. Alfred Levins

## 2019-10-28 NOTE — ED Notes (Signed)
Patient's wallet placed in valuables envelope to be placed in hospital safe by security.

## 2019-10-28 NOTE — ED Provider Notes (Signed)
[facility] Emergency Department Provider Note  ____________________________________________  Time seen: Approximately 3:48 AM  I have reviewed the triage vital signs and the nursing notes.   HISTORY  Chief Complaint No chief complaint on file.  Level 5 caveat:  Portions of the history and physical were unable to be obtained due to cardiac arrest   HPI Joseph Peters is a 64 y.o. male with history as listed below who presents to the emergency room for shortness of breath.  According to EMS, they were called out to patient's home for shortness of breath which she has had for several days.  In route to the emergency room patient had respiratory arrest and upon arrival to the emergency room patient also was in cardiac arrest.   Past Medical History:  Diagnosis Date   AAA (abdominal aortic aneurysm) without rupture ([place])    Abdominal aneurysm ([place]) 2015   being followed but not large enough to surgically treat   Alcoholism ([place])    Anxiety    Arthritis    Asthma    Benign hypertension 07/16/2014   Chest pain on exertion 07/16/2014   Chronic pain syndrome    Coccydynia 02/08/2012   COPD (chronic obstructive pulmonary disease) ([place])    patient smokes   Coronary artery disease    Cranial nerve dysfunction 2015   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   Depression    Disc disease with myelopathy, 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   Dyspnea    Frequent falls    GERD (gastroesophageal reflux disease)    Hyperlipidemia    IBS (irritable bowel syndrome)    Myocardial infarction ([place]) 2010   Neuropathy    Pneumonia    Prostatitis    Restless leg syndrome    Sciatica    Spinal stenosis of lumbar region    Stroke [facility]) 2015   no residual symptoms   Tremor, essential     Patient Active Problem List   Diagnosis Date Noted   Acute on chronic systolic CHF (congestive heart failure) ([place]) 04/28/2019   Elevated troponin 04/28/2019   HCAP  (healthcare-associated pneumonia) 04/28/2019   Sepsis ([place]) 04/28/2019   CAD (coronary artery disease) 04/28/2019   BPH (benign prostatic hyperplasia) 04/28/2019   Depression 04/28/2019   History of MAC infection    Pleural effusion on right    SOB (shortness of breath)    Anxiety about health    Dyspnea    Acute on chronic respiratory failure with hypoxia ([place])    Goals of care, counseling/discussion    Advanced care planning/counseling discussion    Palliative care by specialist    Restless legs syndrome (RLS) 04/01/2019   Protein-calorie malnutrition, severe 10/15/2018   Nodule of upper lobe of right lung 10/15/2018   RLQ abdominal pain 10/14/2018   Status post total hip replacement, right 07/01/2018   Strain of right knee 06/26/2018   Lumbar strain 06/16/2018   Strain of right hip 06/16/2018   Hyperlipidemia 01/30/2018   Rotator cuff tendinitis, right 08/19/2017   Traumatic complete tear of right rotator cuff 08/19/2017   Ascending aortic aneurysm ([place]) 08/01/2017   Tremor, essential 05/28/2017   Gastroesophageal reflux disease without esophagitis 12/25/2016   Renal artery stenosis ([facility]) 12/05/2015   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   Syncope and collapse 12/05/2015   Essential hypertension 10/15/2015   COPD exacerbation ([place]) 01/31/2015   Acute respiratory failure ([place]) 01/31/2015   Drug allergy 01/30/2015   AAA (abdominal aortic aneurysm) ([place]) 11/23/2014  Prostate cancer screening 11/02/2014   Gross hematuria 11/02/2014   Benign hypertension 07/16/2014   Inflammatory disease of prostate 11/30/2013   Stroke [facility]) 2015   Disc disease with myelopathy, 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   Occlusion and stenosis of unspecified carotid artery 05/14/2012   Coccydynia 02/08/2012   Disorder of sacroiliac joint 02/08/2012   Gait disorder 02/08/2012   Spinal stenosis of lumbar region 01/13/2009   Atherosclerotic heart disease of native  coronary artery without angina pectoris 06/28/2008   Sciatica 06/28/2008   Recurrent major depressive disorder in partial remission ([place]) 06/28/2008    Past Surgical History:  Procedure Laterality Date   CARDIAC CATHETERIZATION  2012   COLONOSCOPY WITH PROPOFOL N/A 11/14/2016   Procedure: COLONOSCOPY WITH PROPOFOL;  Surgeon: Manya Silvas, MD;  Location: [facility] ENDOSCOPY;  Service: Endoscopy;  Laterality: N/A;   ESOPHAGOGASTRODUODENOSCOPY (EGD) WITH PROPOFOL N/A 11/14/2016   Procedure: ESOPHAGOGASTRODUODENOSCOPY (EGD) WITH PROPOFOL;  Surgeon: Manya Silvas, MD;  Location: [facility] ENDOSCOPY;  Service: Endoscopy;  Laterality: N/A;   JOINT REPLACEMENT Right    hip   KNEE SURGERY     right   SHOULDER ARTHROSCOPY WITH OPEN ROTATOR CUFF REPAIR Right 08/20/2017   Procedure: SHOULDER ARTHROSCOPY WITH OPEN ROTATOR CUFF REPAIR;  Surgeon: Corky Mull, MD;  Location: [facility] ORS;  Service: Orthopedics;  Laterality: Right;   SHOULDER ARTHROSCOPY WITH SUBACROMIAL DECOMPRESSION, ROTATOR CUFF REPAIR AND BICEP TENDON REPAIR Left 05/10/2016   Procedure: SHOULDER ARTHROSCOPY WITH DEBTRIDEMENT, DECOMPRESSION, REPAIR OF MASSIVE ROTATOR CUFF TEAR AND BICEP TENODESIS;  Surgeon: Corky Mull, MD;  Location: [facility] ORS;  Service: Orthopedics;  Laterality: Left;  Massive cuff tear   TOTAL HIP ARTHROPLASTY Right 07/01/2018   Procedure: TOTAL HIP ARTHROPLASTY - RIGHT;  Surgeon: Corky Mull, MD;  Location: [facility] ORS;  Service: Orthopedics;  Laterality: Right;   VIDEO BRONCHOSCOPY WITH ENDOBRONCHIAL NAVIGATION N/A 02/13/2019   Procedure: VIDEO BRONCHOSCOPY WITH ENDOBRONCHIAL NAVIGATION;  Surgeon: Ottie Glazier, MD;  Location: [facility] ORS;  Service: Thoracic;  Laterality: N/A;   VIDEO BRONCHOSCOPY WITH ENDOBRONCHIAL NAVIGATION N/A 03/13/2019   Procedure: VIDEO BRONCHOSCOPY WITH ENDOBRONCHIAL NAVIGATION;  Surgeon: Ottie Glazier, MD;  Location: [facility] ORS;  Service: Thoracic;  Laterality: N/A;   VIDEO BRONCHOSCOPY  WITH ENDOBRONCHIAL ULTRASOUND N/A 02/13/2019   Procedure: VIDEO BRONCHOSCOPY WITH ENDOBRONCHIAL ULTRASOUND;  Surgeon: Ottie Glazier, MD;  Location: [facility] ORS;  Service: Thoracic;  Laterality: N/A;   VIDEO BRONCHOSCOPY WITH ENDOBRONCHIAL ULTRASOUND N/A 03/13/2019   Procedure: VIDEO BRONCHOSCOPY WITH ENDOBRONCHIAL ULTRASOUND;  Surgeon: Ottie Glazier, MD;  Location: [facility] ORS;  Service: Thoracic;  Laterality: N/A;    Prior to Admission medications   Medication Sig Start Date End Date Taking? Authorizing Provider  albuterol (PROVENTIL HFA;VENTOLIN HFA) 108 (90 Base) MCG/ACT inhaler Inhale 2 puffs into the lungs every 4 (four) hours as needed for wheezing or shortness of breath.     [provider]  arformoterol (BROVANA) 15 MCG/2ML NEBU Inhale into the lungs. 06/12/19   [provider]  aspirin EC 81 MG EC tablet Take 1 tablet (81 mg total) by mouth daily. 05/04/19   Nolberto Hanlon, MD  azithromycin (ZITHROMAX) 500 MG tablet Take 1 tablet (500 mg total) by mouth daily. 09/03/19   Tsosie Billing, MD  carvedilol (COREG) 12.5 MG tablet Take 12.5 mg by mouth 2 (two) times daily with a meal.    [provider]  DALIRESP 250 MCG TABS Take 250 mcg by mouth at bedtime. 10/17/18   Stark Jock Jude, MD  ethambutol (MYAMBUTOL) 400 MG  tablet Take 2 tablets (800 mg total) by mouth daily. 09/03/19   Tsosie Billing, MD  ezetimibe-simvastatin (VYTORIN) 10-10 MG tablet Take 1 tablet by mouth at bedtime.    [provider]  finasteride (PROSCAR) 5 MG tablet Take 1 tablet (5 mg total) by mouth daily. 03/02/19   Festus Aloe, MD  FLUoxetine (PROZAC) 20 MG capsule Take 20 mg by mouth daily.     [provider]  Fluticasone-Umeclidin-Vilant (TRELEGY ELLIPTA) 100-62.5-25 MCG/INH AEPB Inhale 2 puffs into the lungs at bedtime.  08/18/18   [provider]  folic acid (FOLVITE) 1 MG tablet Take 1 tablet (1 mg total) by mouth daily. 05/04/19   Nolberto Hanlon, MD  furosemide  (LASIX) 40 MG tablet Take 1 tablet (40 mg total) by mouth daily. 05/03/19   Nolberto Hanlon, MD  gabapentin (NEURONTIN) 300 MG capsule Take 900 mg by mouth at bedtime.     [provider]  lidocaine (LIDODERM) 5 % Place 1 patch onto the skin every 12 (twelve) hours. Remove & Discard patch within 12 hours or as directed by MD 05/23/19 05/22/20  Blake Divine, MD  metoprolol succinate (TOPROL-XL) 25 MG 24 hr tablet Take 0.5 tablets (12.5 mg total) by mouth daily. 05/03/19 06/23/19  Nolberto Hanlon, MD  mirtazapine (REMERON) 7.5 MG tablet Take 1 tablet (7.5 mg total) by mouth at bedtime. 04/23/19 05/23/19  Wyvonnia Dusky, MD  montelukast (SINGULAIR) 10 MG tablet Take 10 mg by mouth at bedtime.    [provider]  Multiple Vitamin (MULTIVITAMIN WITH MINERALS) TABS tablet Take 1 tablet by mouth daily. 05/04/19   Nolberto Hanlon, MD  nicotine (NICODERM CQ - DOSED IN MG/24 HOURS) 21 mg/24hr patch Place 1 patch (21 mg total) onto the skin daily. 05/03/19   Nolberto Hanlon, MD  omeprazole (PRILOSEC) 20 MG capsule Take 20 mg by mouth daily.    [provider]  Oxycodone HCl 10 MG TABS Take 10 mg by mouth 4 (four) times daily as needed for pain. Patient not taking: Reported on 09/03/2019 09/29/18   [provider]  rifabutin (MYCOBUTIN) 150 MG capsule Take 2 capsules (300 mg total) by mouth daily. 09/03/19   Tsosie Billing, MD  rosuvastatin (CRESTOR) 40 MG tablet Take 40 mg by mouth daily.    [provider]  sacubitril-valsartan (ENTRESTO) 24-26 MG Take 1 tablet by mouth 2 (two) times daily.    [provider]  tamsulosin (FLOMAX) 0.4 MG CAPS capsule Take 1 capsule (0.4 mg total) by mouth daily after supper. 10/05/19   Festus Aloe, MD  thiamine 100 MG tablet Take 1 tablet (100 mg total) by mouth daily. 05/04/19   Nolberto Hanlon, MD    Allergies Penicillins and Lidocaine  Family History  Problem Relation Age of Onset   Cancer Mother    COPD Father 27       black  lung    Social History Social History   Tobacco Use   Smoking status: Former Smoker    Packs/day: 0.50    Years: 40.00    Pack years: 20.00    Types: Cigarettes    Start date: 02/26/1973    Quit date: 09/21/2018    Years since quitting: 1.0   Smokeless tobacco: Former Systems developer    Quit date: 02/26/1998   Tobacco comment: smokes 2-3 cig a week 02/12/19; per pt no longer smokes  Vaping Use   Vaping Use: Never used  Substance Use Topics   Alcohol use: Yes  Alcohol/week: 20.0 standard drinks    Types: 20 Cans of beer per week    Comment: week    Drug use: Yes    Comment: prescribed hydrocodone    Review of Systems  Cardiovascular: + cardiac arrest Respiratory: + respiratory arrest  Level 5 caveat:  Portions of the history and physical were unable to be obtained due to cardiac arrest   ____________________________________________   PHYSICAL EXAM:  VITAL SIGNS: ED Triage Vitals  Enc Vitals Group     BP      Pulse      Resp      Temp      Temp src      SpO2      Weight      Height      Head Circumference      Peak Flow      Pain Score      Pain Loc      Pain Edu?      Excl. in [place]?     Constitutional: GCS 3, mottled and cold to the touch on arrival HEENT:      Head: Normocephalic and atraumatic.         Eyes: Conjunctivae are normal. Sclera is non-icteric.       Mouth/Throat: Mucous membranes are dry.       Neck: Supple with no signs of meningismus. Cardiovascular: Pulseless. Respiratory: apneic Gastrointestinal: Soft and non distended. Musculoskeletal:  No edema, cyanosis, or erythema of extremities. Neurologic: GCS 3 Skin: Skin is mottled and cool to the touch  ____________________________________________   LABS (all labs ordered are listed, but only abnormal results are displayed)  Labs Reviewed  GLUCOSE, CAPILLARY - Abnormal; Notable for the following components:      Result Value   Glucose-Capillary 50 (*)    All other components within  normal limits  GLUCOSE, CAPILLARY - Abnormal; Notable for the following components:   Glucose-Capillary 51 (*)    All other components within normal limits   ____________________________________________  EKG  none  ____________________________________________  RADIOLOGY  none  ____________________________________________   PROCEDURES  Procedure(s) performed:yes Procedure Name: Intubation Date/Time: 2019/11/24 3:51 AM Performed by: Rudene Re, MD Pre-anesthesia Checklist: Patient identified, Emergency Drugs available, Suction available and Patient being monitored Preoxygenation: Pre-oxygenation with 100% oxygen Induction Type: IV induction and Rapid sequence Laryngoscope Size: Glidescope and 3 Tube size: 7.5 mm Number of attempts: 1 Airway Equipment and Method: Video-laryngoscopy Placement Confirmation: ETT inserted through vocal cords under direct vision,  CO2 detector,  Breath sounds checked- equal and bilateral and Positive ETCO2 Secured at: 23 cm Tube secured with: ETT holder Dental Injury: Teeth and Oropharynx as per pre-operative assessment       Critical Care performed: yes  CRITICAL CARE Performed by: Rudene Re  ?  Total critical care time: 40 min  Critical care time was exclusive of separately billable procedures and treating other patients.  Critical care was necessary to treat or prevent imminent or life-threatening deterioration.  Critical care was time spent personally by me on the following activities: development of treatment plan with patient and/or surrogate as well as nursing, discussions with consultants, evaluation of patient's response to treatment, examination of patient, obtaining history from patient or surrogate, ordering and performing treatments and interventions, ordering and review of laboratory studies, ordering and review of radiographic studies, pulse oximetry and re-evaluation of patient's  condition.  ____________________________________________   INITIAL IMPRESSION / ASSESSMENT AND PLAN / ED COURSE   64 y.o. male  with history as listed below who presents to the emergency room for shortness of breath.  EMS arrived patient was complaining of shortness of breath and he was placed on a nonrebreather and transported to the emergency room.  Upon arrival to the emergency room, patient was in cardiac and respiratory arrest, mottled and cool to the touch.  Initial rhythm was asystole.  Resuscitation efforts following ACLS protocol were initiated.  Patient was intubated without any sedation or paralytics.  Several rounds of CPR with epi were administered.  Briefly patient had an episode of wide-complex tachycardia for which he received one shock however after that patient remained in PEA or asystole throughout the entire resuscitation.  Patient received several rounds of bicarbonate.  Blood glucose was less than 50 on arrival and received 2 Amps of D50.  After several rounds of CPR, bedside ultrasound was done showing cardiac standstill.  Time of death was pronounced at 3:45 AM. Wife was updated over the phone.       _____________________________________________ Please note:  Patient was evaluated in Emergency Department today for the symptoms described in the history of present illness. Patient was evaluated in the context of the global COVID-19 pandemic, which necessitated consideration that the patient might be at risk for infection with the SARS-CoV-2 virus that causes COVID-19. Institutional protocols and algorithms that pertain to the evaluation of patients at risk for COVID-19 are in a state of rapid change based on information released by regulatory bodies including the CDC and federal and state organizations. These policies and algorithms were followed during the patient's care in the ED.  Some ED evaluations and interventions may be delayed as a result of limited staffing during the  pandemic.    Controlled Substance Database was reviewed by me. ____________________________________________   FINAL CLINICAL IMPRESSION(S) / ED DIAGNOSES   Final diagnoses:  Respiratory arrest ([place])  Cardiac arrest ([place])      NEW MEDICATIONS STARTED DURING THIS VISIT:  ED Discharge Orders    None       Note:  This document was prepared using Dragon voice recognition software and may include unintentional dictation errors.    Alfred Levins, [place], MD 2019-11-18 [phone]

## 2019-10-28 DEATH — deceased

## 2020-06-12 IMAGING — CR CHEST - 2 VIEW
2 series · 3 of 3 positions shown · non-contrast
Comparison: Prior chest x-ray 10/14/2018

CLINICAL DATA: 63-year-old male with recent diagnosis of pneumonia.
Follow-up evaluation.

EXAM:
CHEST - 2 VIEW

[Series 1: chest pa · 0.14mm/px · 2 of 2 slices shown]
[im 1/2]
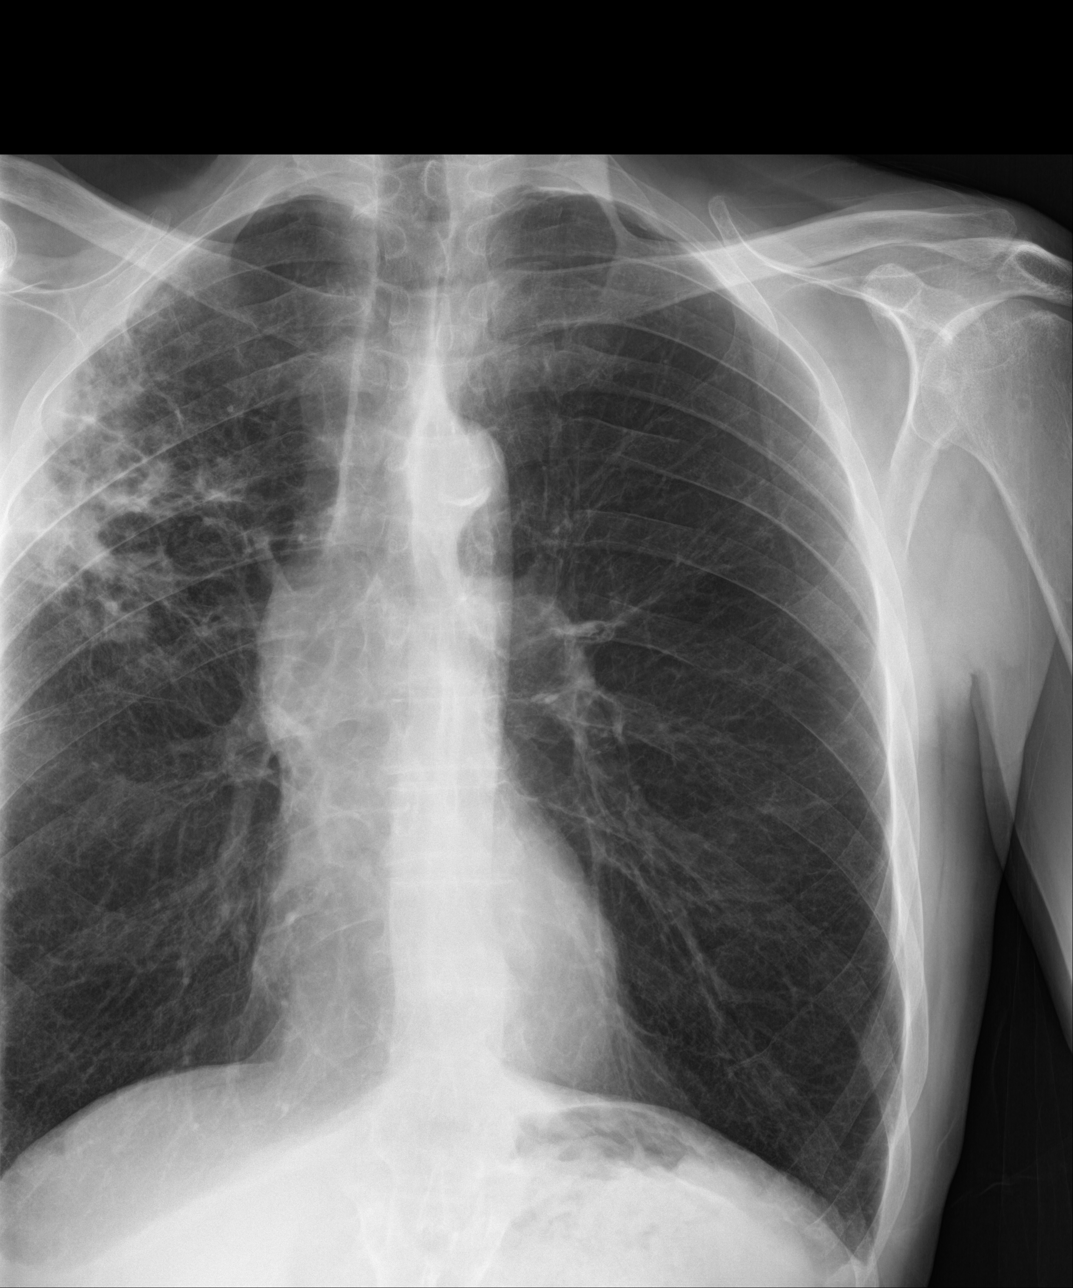
[im 2/2]
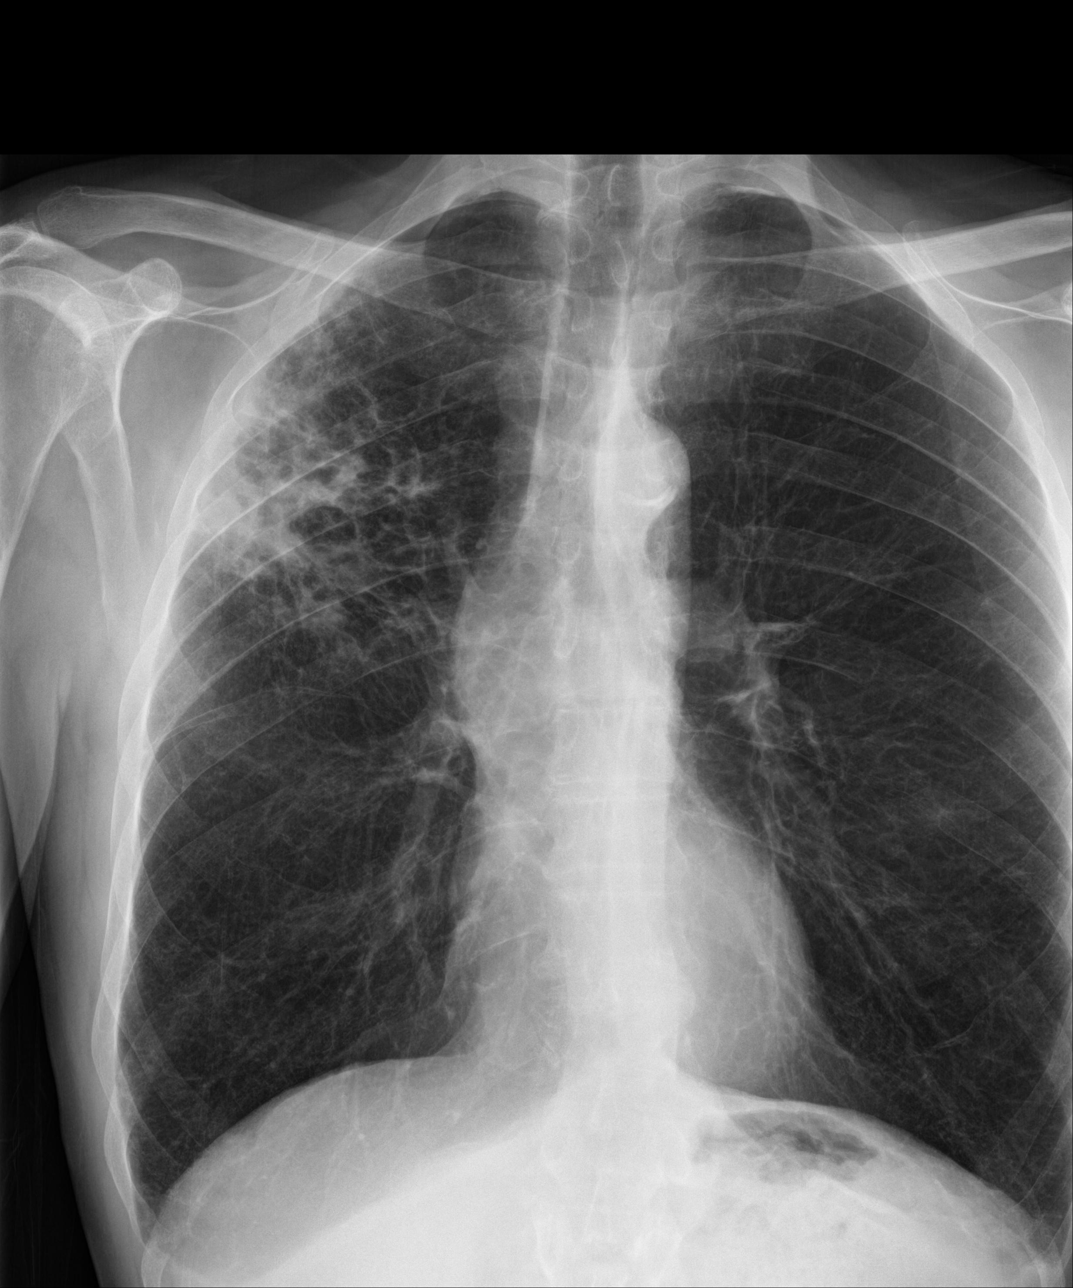

[chest lat]
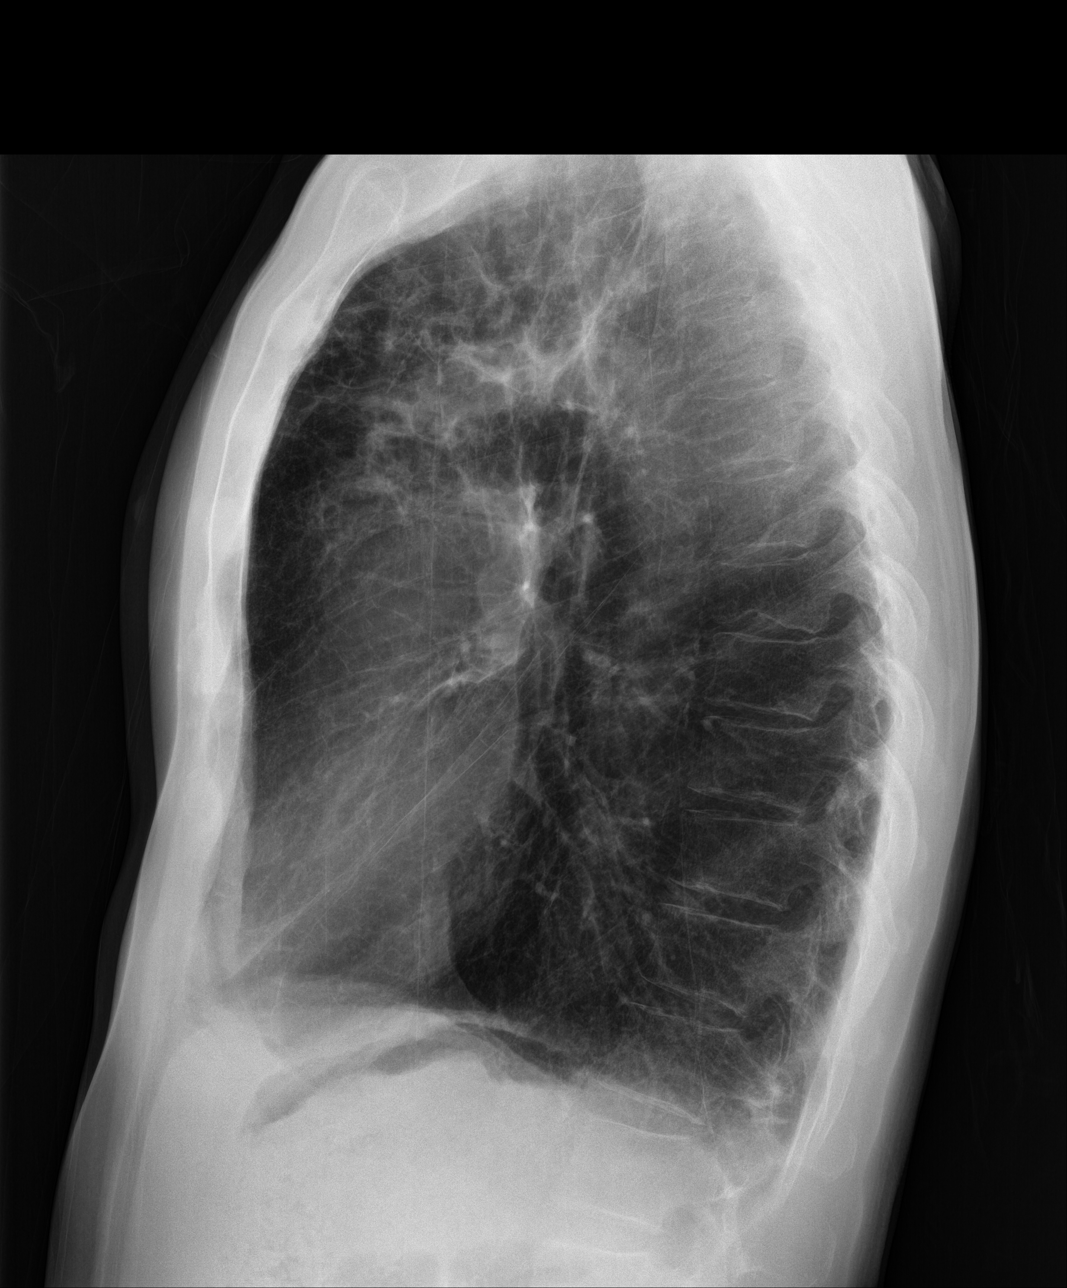

[3 of 3 positions shown; findings below may reference images not displayed]

FINDINGS: There has been some slight interval improvement in peripheral patchy
airspace opacity in the right upper lobe. The lungs remain
hyperinflated with advanced emphysematous changes. Stable cardiac
and mediastinal contours. Atherosclerotic calcifications again noted
in the transverse aorta. No acute osseous abnormality.
IMPRESSION: Perhaps some slight interval improvement in patchy peripheral
airspace opacity in the right upper lobe which could reflect
resolving pneumonia. Underlying malignancy still not excluded.
Recommend repeat chest x-ray in 4-6 weeks.

## 2020-11-01 IMAGING — DX DG CHEST 1V PORT
2 series · 2 of 2 positions shown · non-contrast
Comparison: 12/23/2018

CLINICAL DATA: Status post bronchoscopy

EXAM:
PORTABLE CHEST 1 VIEW

[chest ap (1 of 2)]
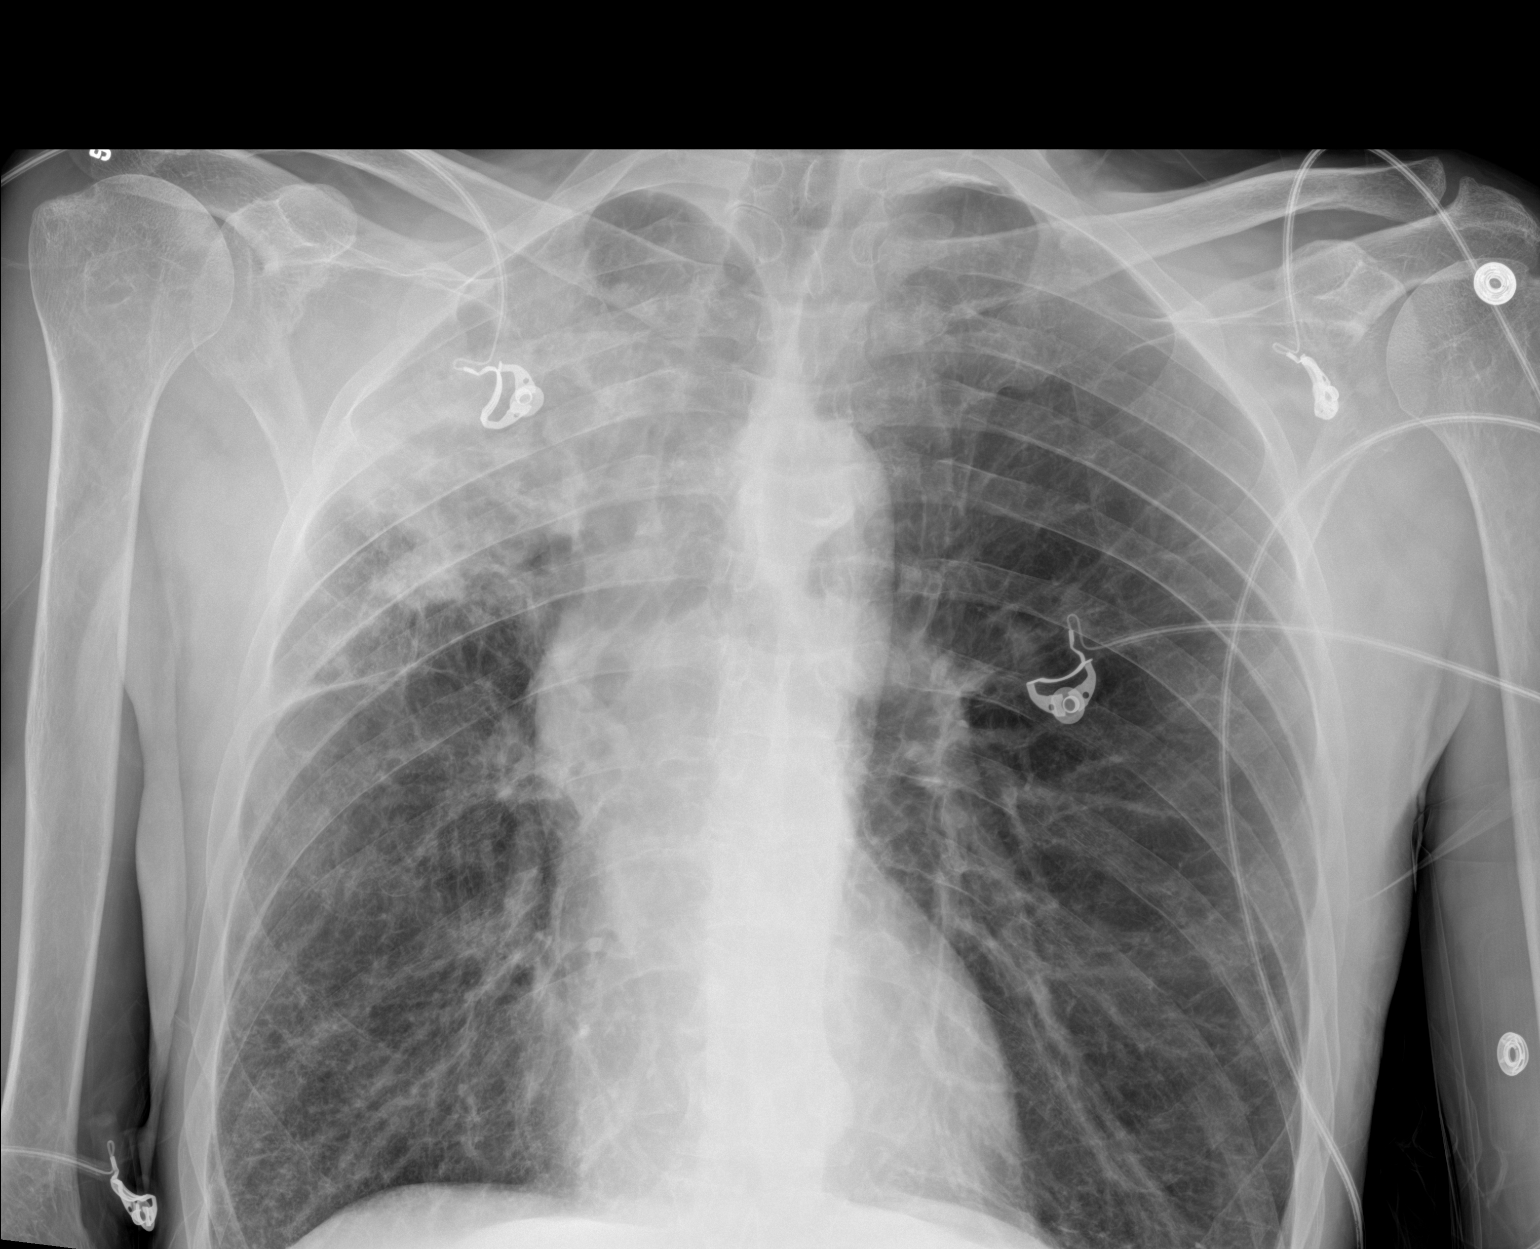

[chest ap (2 of 2)]
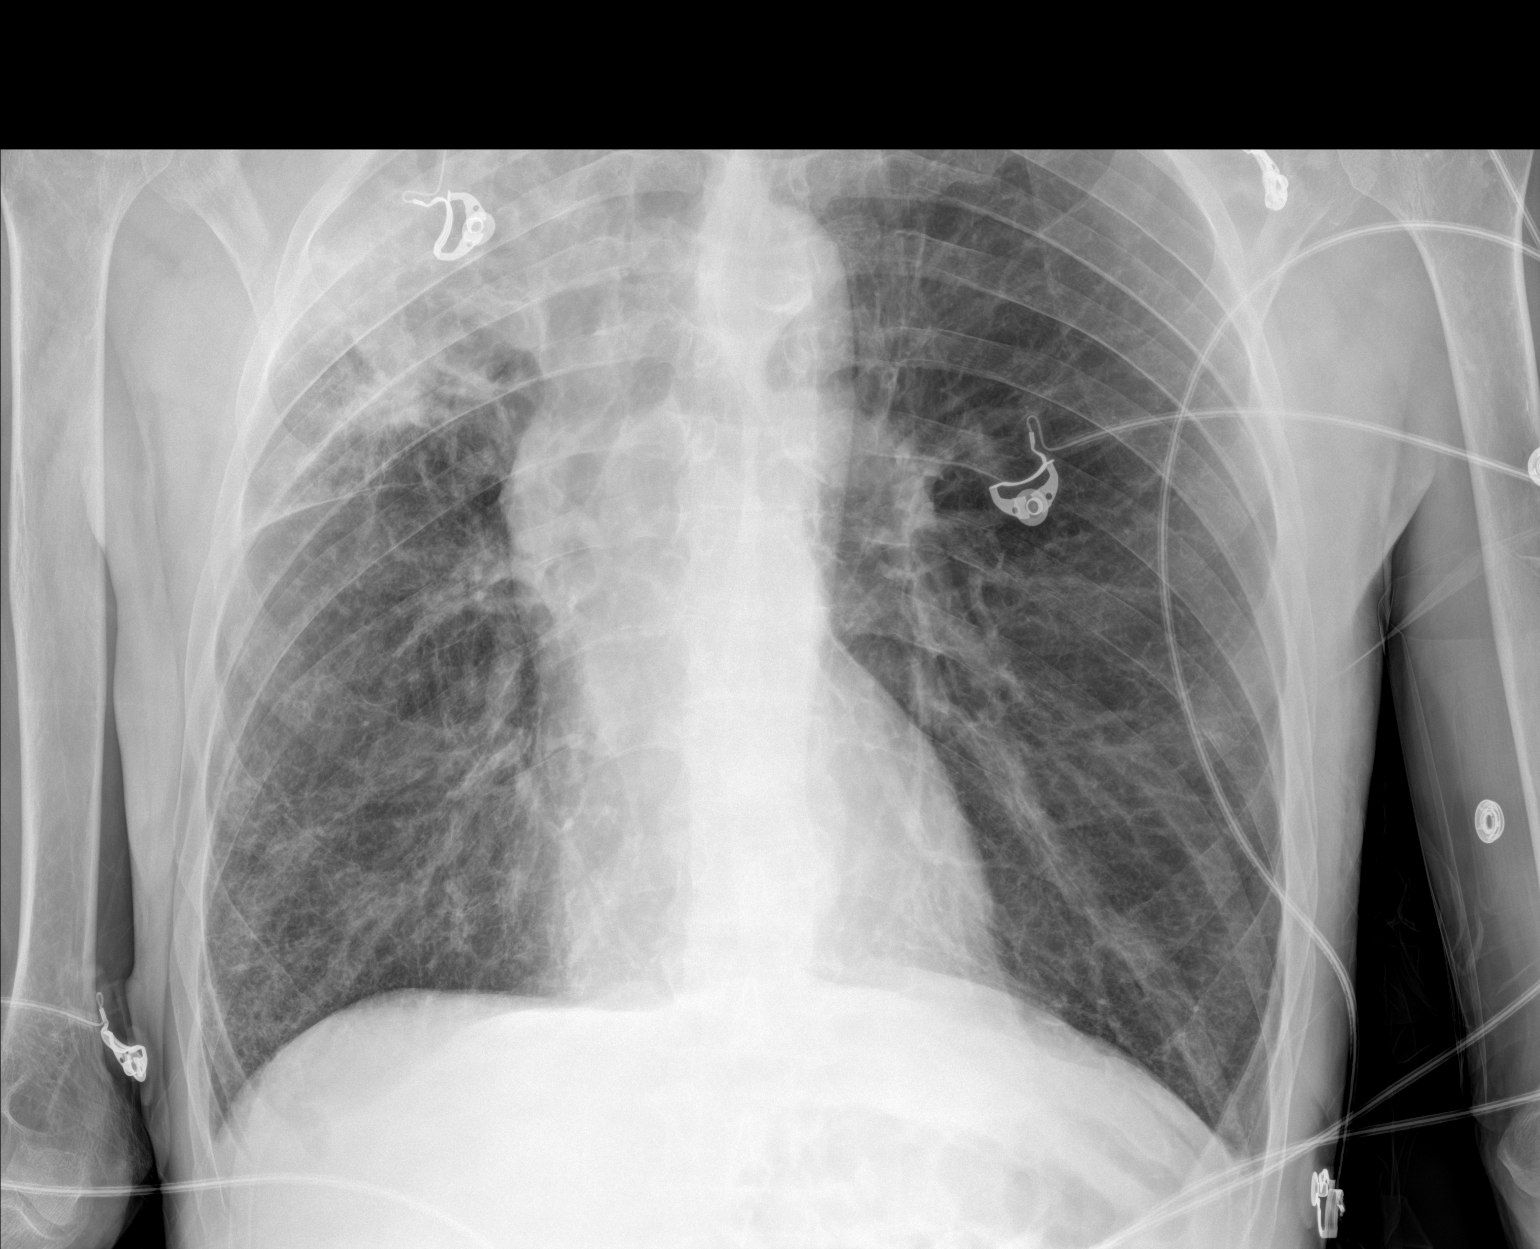

[2 of 2 positions shown; findings below may reference images not displayed]

FINDINGS: The heart size and mediastinal contours are within normal limits.
Redemonstrated masslike consolidation of the anterior right upper
lobe. Emphysema. The visualized skeletal structures are
unremarkable.
IMPRESSION: 1. Redemonstrated masslike consolidation of the anterior right upper
lobe. No acute appearing airspace opacity.

2.  Emphysema.

## 2021-01-10 IMAGING — CR DG CHEST 2V
2 series · 2 of 2 positions shown · non-contrast
Comparison: May 11, 2019

CLINICAL DATA: Chest pain and shortness of breath

EXAM:
CHEST - 2 VIEW

[chest lat]
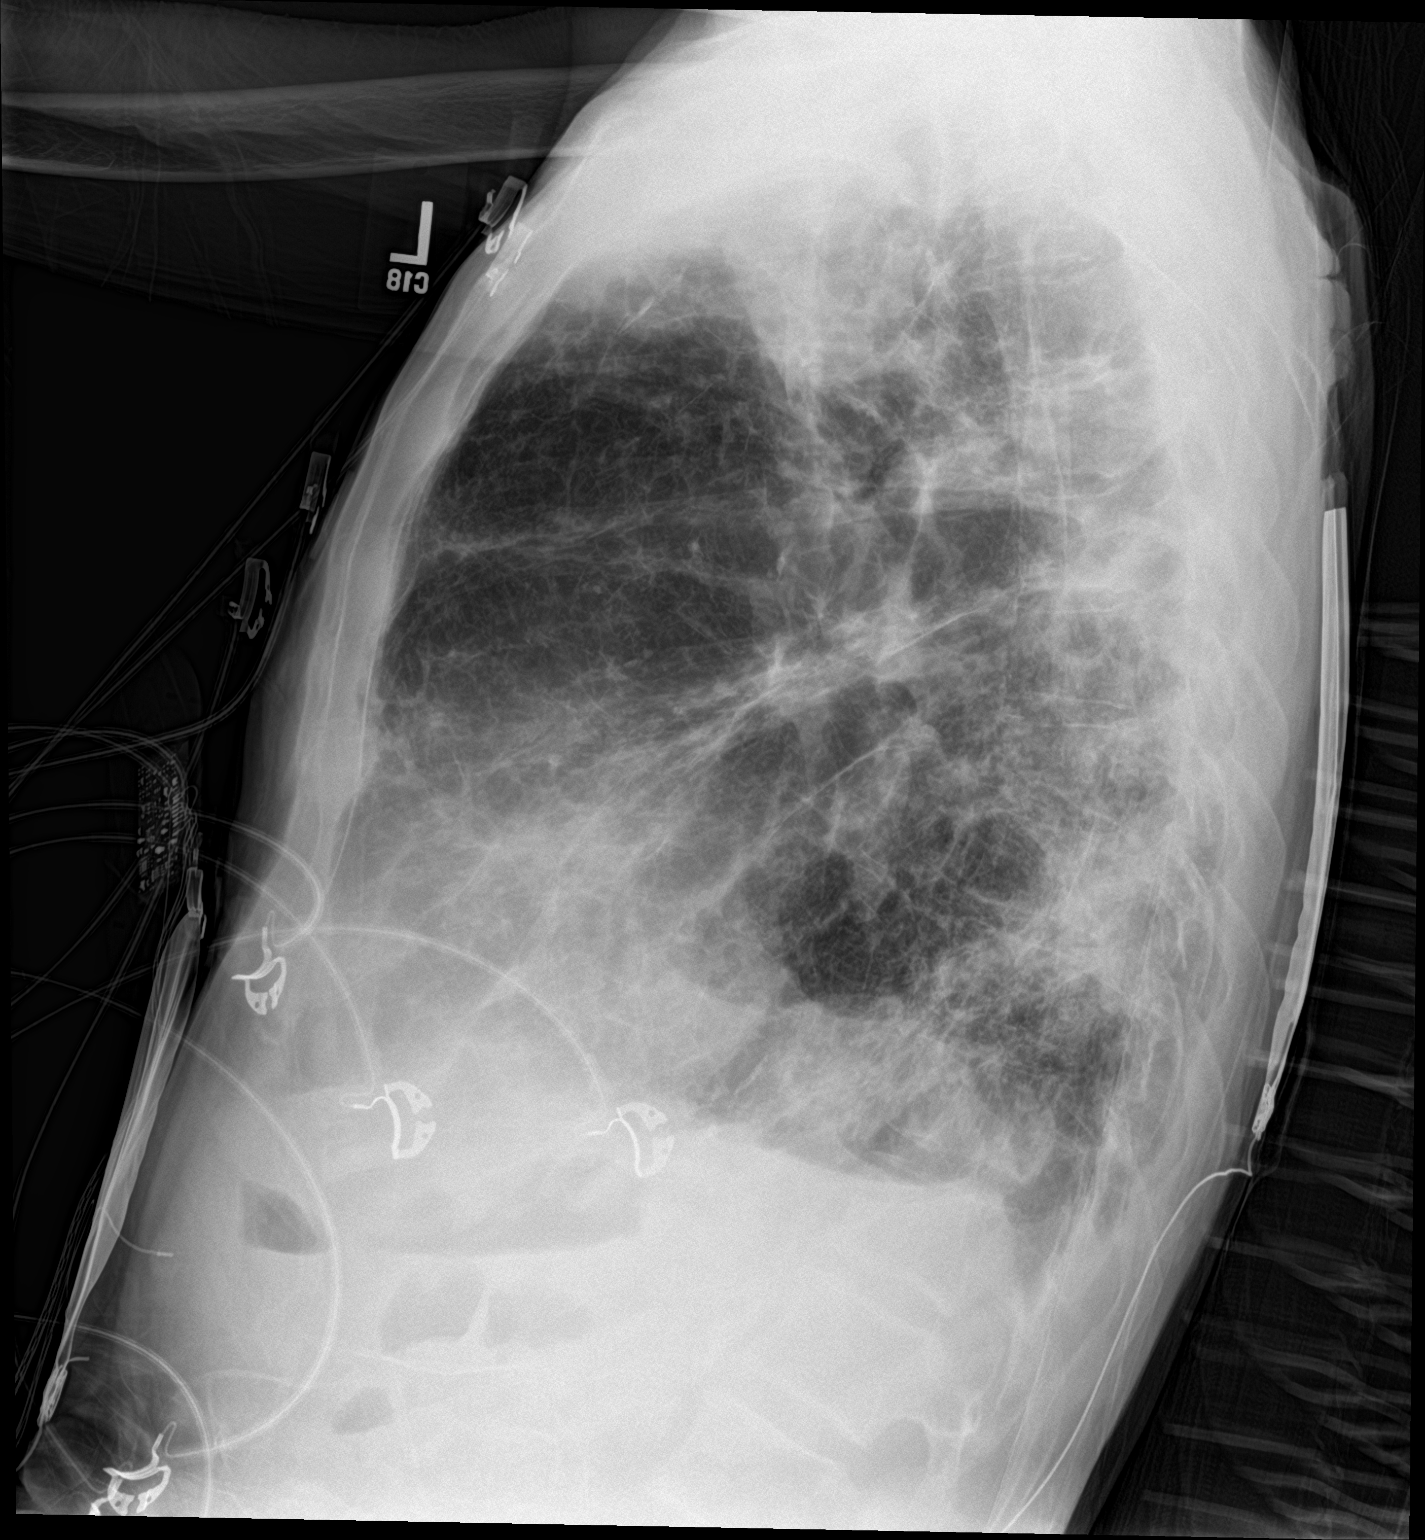

[chest ap]
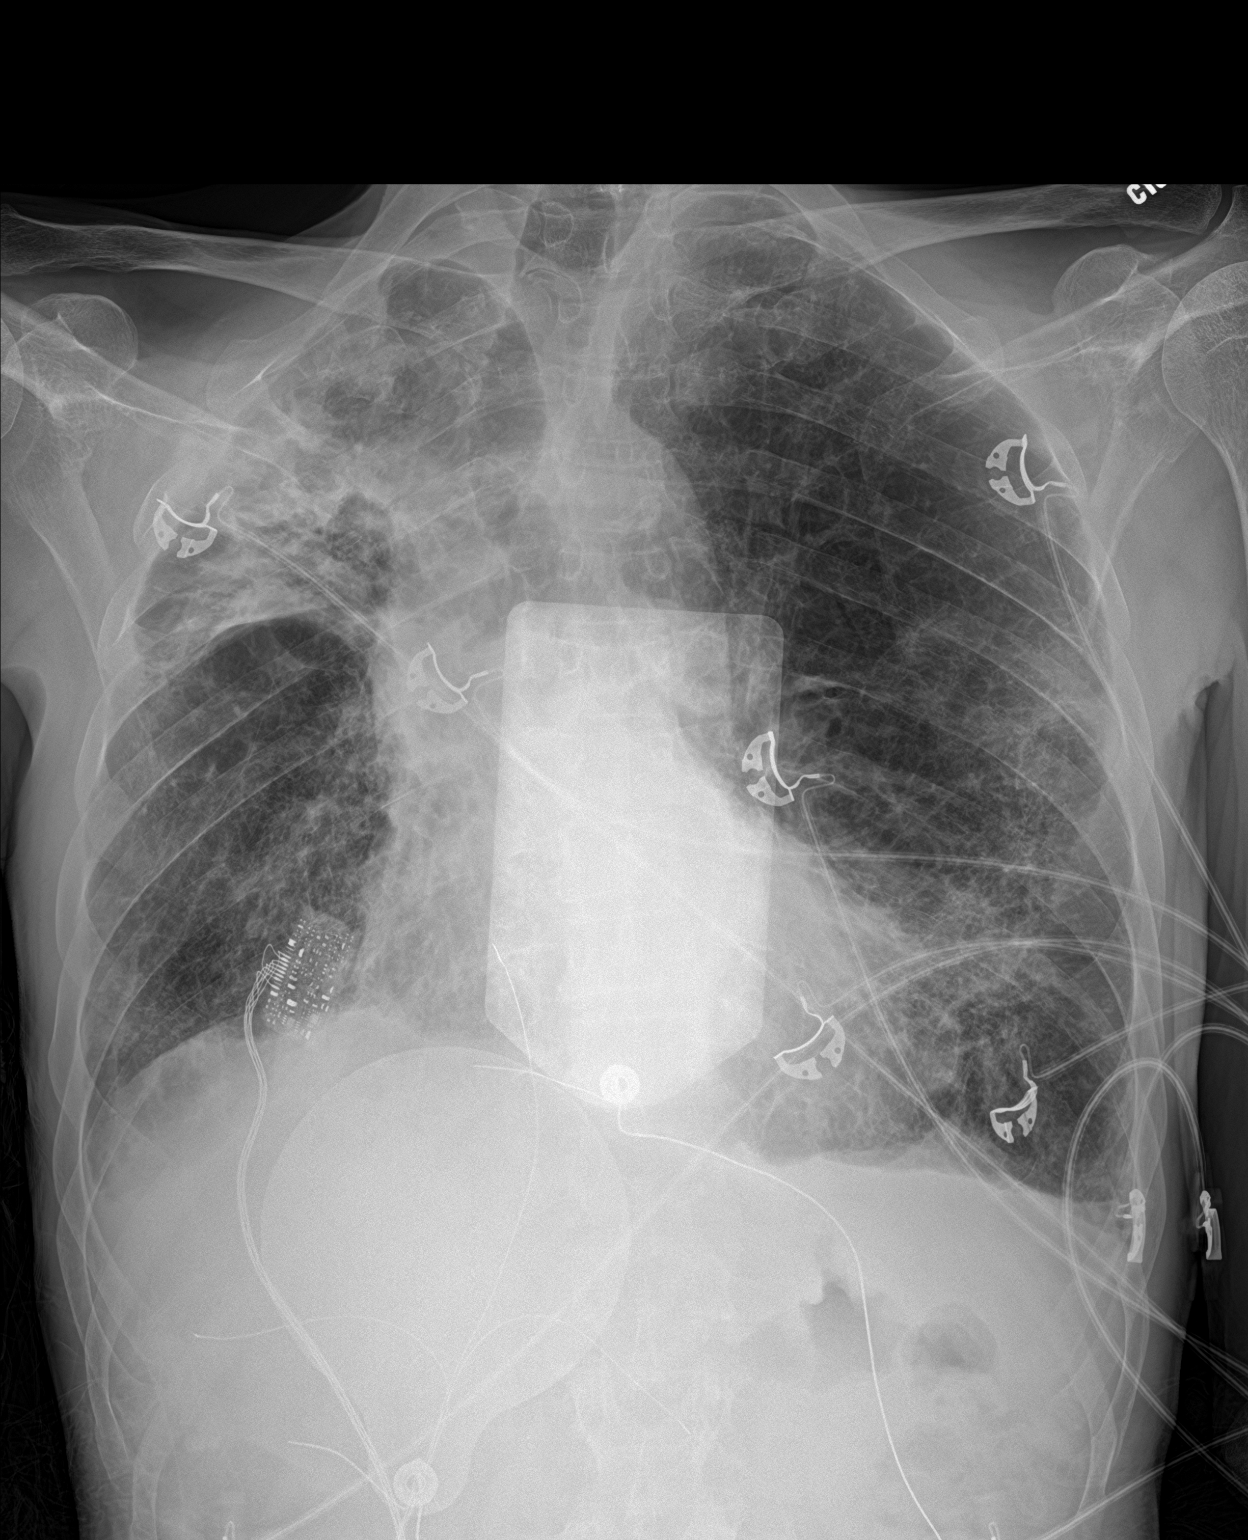

[2 of 2 positions shown; findings below may reference images not displayed]

FINDINGS: Patchy opacity of right upper lobe is identified unchanged. Patchy
opacity of the left lung base is unchanged. The left lung is
hyperinflated. The mediastinal contour and cardiac silhouette are
unchanged. The bony structures are stable.
IMPRESSION: Patchy opacity of right upper lobe and left lung base unchanged
compared to prior exam.

## 2021-12-22 LAB — ACID FAST SMEAR (AFB, MYCOBACTERIA): Acid Fast Smear: POSITIVE — AB

## 2022-01-05 LAB — MAC SUSCEPTIBILITY BROTH

## 2022-01-26 LAB — ACID FAST SMEAR (AFB, MYCOBACTERIA)
Acid Fast Smear: POSITIVE — AB
Acid Fast Smear: POSITIVE — AB
